# Patient Record
Sex: Female | Born: 1935
Health system: Southern US, Community
[De-identification: ages and names within clinical notes are randomized; demographics above are authoritative.]

## PROBLEM LIST (undated history)

## (undated) DIAGNOSIS — I82513 Chronic embolism and thrombosis of femoral vein, bilateral: Secondary | ICD-10-CM

## (undated) DIAGNOSIS — I998 Other disorder of circulatory system: Secondary | ICD-10-CM

## (undated) DIAGNOSIS — R42 Dizziness and giddiness: Secondary | ICD-10-CM

## (undated) DIAGNOSIS — H409 Unspecified glaucoma: Secondary | ICD-10-CM

## (undated) DIAGNOSIS — M810 Age-related osteoporosis without current pathological fracture: Secondary | ICD-10-CM

## (undated) DIAGNOSIS — E669 Obesity, unspecified: Secondary | ICD-10-CM

## (undated) DIAGNOSIS — I739 Peripheral vascular disease, unspecified: Secondary | ICD-10-CM

## (undated) DIAGNOSIS — K922 Gastrointestinal hemorrhage, unspecified: Secondary | ICD-10-CM

## (undated) DIAGNOSIS — I10 Essential (primary) hypertension: Secondary | ICD-10-CM

## (undated) DIAGNOSIS — M549 Dorsalgia, unspecified: Secondary | ICD-10-CM

## (undated) DIAGNOSIS — C419 Malignant neoplasm of bone and articular cartilage, unspecified: Secondary | ICD-10-CM

## (undated) DIAGNOSIS — F32A Depression, unspecified: Secondary | ICD-10-CM

## (undated) DIAGNOSIS — K449 Diaphragmatic hernia without obstruction or gangrene: Secondary | ICD-10-CM

## (undated) DIAGNOSIS — N183 Chronic kidney disease, stage 3 (moderate): Secondary | ICD-10-CM

## (undated) DIAGNOSIS — M1990 Unspecified osteoarthritis, unspecified site: Secondary | ICD-10-CM

## (undated) DIAGNOSIS — F329 Major depressive disorder, single episode, unspecified: Secondary | ICD-10-CM

## (undated) HISTORY — DX: Other disorder of circulatory system: I99.8

## (undated) HISTORY — PX: BACK SURGERY: SHX140

## (undated) HISTORY — DX: Age-related osteoporosis without current pathological fracture: M81.0

## (undated) HISTORY — DX: Chronic embolism and thrombosis of femoral vein, bilateral: I82.513

## (undated) HISTORY — DX: Unspecified glaucoma: H40.9

## (undated) HISTORY — DX: Dizziness and giddiness: R42

## (undated) HISTORY — PX: CATARACT EXTRACTION W/ INTRAOCULAR LENS  IMPLANT, BILATERAL: SHX1307

## (undated) HISTORY — DX: Diaphragmatic hernia without obstruction or gangrene: K44.9

## (undated) HISTORY — PX: APPENDECTOMY: SHX54

## (undated) HISTORY — DX: Essential (primary) hypertension: I10

## (undated) HISTORY — DX: Obesity, unspecified: E66.9

## (undated) HISTORY — PX: FRACTURE SURGERY: SHX138

---

## 1972-08-15 HISTORY — PX: ABDOMINAL HYSTERECTOMY: SHX81

## 1997-08-15 HISTORY — PX: BREAST BIOPSY: SHX20

## 1999-10-22 ENCOUNTER — Ambulatory Visit (HOSPITAL_COMMUNITY): Admission: RE | Admit: 1999-10-22 | Discharge: 1999-10-22 | Payer: Self-pay | Admitting: Family Medicine

## 2001-01-01 ENCOUNTER — Other Ambulatory Visit: Admission: RE | Admit: 2001-01-01 | Discharge: 2001-01-01 | Payer: Self-pay | Admitting: Family Medicine

## 2004-04-15 DIAGNOSIS — K922 Gastrointestinal hemorrhage, unspecified: Secondary | ICD-10-CM

## 2004-04-15 HISTORY — PX: ESOPHAGOGASTRODUODENOSCOPY: SHX1529

## 2004-04-15 HISTORY — DX: Gastrointestinal hemorrhage, unspecified: K92.2

## 2004-05-14 ENCOUNTER — Inpatient Hospital Stay (HOSPITAL_COMMUNITY): Admission: AD | Admit: 2004-05-14 | Discharge: 2004-05-16 | Payer: Self-pay | Admitting: Internal Medicine

## 2005-04-06 ENCOUNTER — Encounter: Admission: RE | Admit: 2005-04-06 | Discharge: 2005-04-06 | Payer: Self-pay | Admitting: Family Medicine

## 2007-04-26 ENCOUNTER — Emergency Department (HOSPITAL_COMMUNITY): Admission: EM | Admit: 2007-04-26 | Discharge: 2007-04-26 | Payer: Self-pay | Admitting: Emergency Medicine

## 2007-10-14 HISTORY — PX: LAPAROSCOPIC CHOLECYSTECTOMY: SUR755

## 2007-10-22 ENCOUNTER — Encounter (HOSPITAL_BASED_OUTPATIENT_CLINIC_OR_DEPARTMENT_OTHER): Payer: Self-pay | Admitting: General Surgery

## 2007-10-22 ENCOUNTER — Ambulatory Visit (HOSPITAL_COMMUNITY): Admission: RE | Admit: 2007-10-22 | Discharge: 2007-10-23 | Payer: Self-pay | Admitting: General Surgery

## 2009-01-05 ENCOUNTER — Ambulatory Visit: Payer: Self-pay | Admitting: Internal Medicine

## 2009-01-13 ENCOUNTER — Encounter: Payer: Self-pay | Admitting: Internal Medicine

## 2009-01-13 ENCOUNTER — Ambulatory Visit: Payer: Self-pay | Admitting: Internal Medicine

## 2009-01-14 ENCOUNTER — Encounter: Payer: Self-pay | Admitting: Internal Medicine

## 2009-03-05 ENCOUNTER — Encounter: Admission: RE | Admit: 2009-03-05 | Discharge: 2009-03-05 | Payer: Self-pay | Admitting: Family Medicine

## 2010-12-28 NOTE — Op Note (Signed)
[name], [name]              ACCOUNT NO.:  [id number]   MEDICAL RECORD NO.:  [id number]          PATIENT TYPE:  OIB   LOCATION:  [place]                         FACILITY:  [facility]   PHYSICIAN:  Kathrin Penner, M.D.   DATE OF BIRTH:  May 17, 1931   DATE OF PROCEDURE:  10/22/2007  DATE OF DISCHARGE:  10/23/2007                               OPERATIVE REPORT   PREOPERATIVE DIAGNOSIS:  Chronic calculous cholecystitis.   POSTOPERATIVE DIAGNOSIS:  Chronic calculous cholecystitis.   PROCEDURE:  Laparoscopic cholecystectomy with intraoperative  cholangiogram.   SURGEON:  Kathrin Penner, M.D.   ASSISTANT:  Mare Loan, MD   ANESTHESIA:  General.   SPECIMENS TO LAB:  Gallbladder with stones.   ESTIMATED BLOOD LOSS:  Minimal.   COMPLICATIONS:  None.  Patient returned to the PACU in excellent  condition.   Ms. Kristin [name] is a 77 year old lady presenting with a rather  atypical gallbladder pain, complaining primarily of right lower quadrant  and left upper quadrant abdominal pain.  Ultrasound shows a gallbladder  with a solitary gallstone.  Other aspects of the abdominal ultrasound  appeared to be normal.  The patient is having symptoms of nausea with  vomiting, but does not have any increased temperature or chills.  She  comes to the operating room now for laparoscopic cholecystectomy after  risks and potential benefits of surgery had been fully discussed.  All  questions answered, consent for surgery obtained.   PROCEDURE:  The patient is positioned supinely.  Following induction of  satisfactory general anesthesia, the abdomen is prepped and draped to be  included in a sterile operative field.  Positive identification of the  patient Kristin [name] and procedure to be done laparoscopic  cholecystectomy carried out.  Open laparoscopy is created just above the  umbilicus in an old midline scar from a hysterectomy done back in 1977.  The was carefully entered and a Hasson  cannula placed and insufflation  of the peritoneal cavity to 14 mmHg pressure using carbon dioxide  carried out.  Upon placing the camera into the abdomen there were  multiple adhesions up to the anterior abdominal wall.  We were able to  get through in an area of thin adhesions up into the upper abdomen.  Liver edges were sharp.  Liver surfaces smooth.  The duodenum was  somewhat tethered up to the gallbladder.  There were multiple adhesions  over the dome of the liver.  None of the small or large intestine  visualized appeared otherwise to be abnormal.  Under direct vision  epigastric and lateral ports were placed, gallbladder is grasped,  retracted cephalad, was rather large and boggy gallbladder.  There were  multiple adhesions to the wall of the gallbladder which were taken down  serially carrying the dissection down to the ampulla of gallbladder.  The ampulla was grasped and dissection carried down to the region of the  ampulla isolating cystic artery and cystic duct.  The cystic duct was  isolated and traced up to the cystic duct and gallbladder junction and  down to the cystic duct common duct junction for  identification cystic  duct was clipped proximally and opened.  A cystic duct cholangiogram was  carried out the cyst was milked of its contents.  I passed a Cook  catheter into the abdomen into the cystic duct through which I injected  one half-strength Hypaque into the extrahepatic biliary system under  fluoroscopic guidance.  The resulting cholangiogram showed rather  dilated common bile duct but there were no filling defects within it and  the common duct emptied normally into the duodenum.   The cystic duct catheter was then removed and cystic duct triply clipped  and transected.  Cystic artery isolated and traced into the gallbladder  wall, doubly clipped and transected.  The gallbladder was then dissected  free from the liver bed using electrocautery and maintaining  hemostasis  throughout the entire course of dissection.  The end of dissection, the  liver bed was then checked for hemostasis.  Additional bleeding points  treated with electrocautery.  The gallbladder was then retrieved through  the umbilical port placed having been placed in an Endo pouch.  I then  turned my attention down to the lower abdomen where multiple adhesions  to the anterior abdominal wall were taken down serially and carefully.  The pneumoperitoneum was released after the trocars removed under direct  vision.  Sponge, instrument counts were doubly verified and wounds  closed in layers as follows.  Umbilical wound in two layers with 0  Vicryl and 4-0 Monocryl, epigastric and flank wounds closed with 4-0  Monocryl.  All wounds reinforced with Steri-Strips.  Sterile dressings  applied. Anesthetic reversed.  The patient removed from the operating  room to the recovery room in stable condition.  She tolerated the  procedure well.      Kathrin Penner, M.D.  Electronically Signed     PB/MEDQ  D:  10/22/2007  T:  10/23/2007  Job:  [id number]   cc:   De Nurse

## 2010-12-31 NOTE — Op Note (Signed)
Kristin [name], Kristin [name]              ACCOUNT NO.:  [id number]   MEDICAL RECORD NO.:  [id number]          PATIENT TYPE:  INP   LOCATION:  5739                         FACILITY:  [facility]   PHYSICIAN:  James L. Rolla Flatten., M.D.DATE OF BIRTH:  1931-04-16   DATE OF PROCEDURE:  05/14/2004  DATE OF DISCHARGE:                                 OPERATIVE REPORT   PROCEDURE:  Esophagogastroduodenoscopy.   ENDOSCOPIST:  Joyice Faster. Oletta Lamas, M.D.   ANESTHESIA:  Fentanyl 30 mcg, Versed 4 mg IV.   INDICATIONS FOR PROCEDURE:  She had a good bit of vomiting for a  sigmoidoscopy prep, vomited up blood, admitted to the hospital, hemoglobin  has been stable. This is done to look for source of bleeding.   DESCRIPTION OF PROCEDURE:  The procedure had been explained to the patient  and consent obtained. With the patient in the left lateral decubitus  position, the Olympus scope was inserted and advanced. Distal esophagus was  reached. There was what appeared to be a Mallory-Weiss tear and ulcer  extending from the esophagus, across the GE-junction into a hiatal hernia  pouch. Down into the pouch, there was a 3 cm hiatal hernia.  Complete  endoscopy was performed, gastric outlet identified and passed. Duodenum,  including the bulb and second portion were completely normal, as was the  antrum and body of the stomach.  Fundus and cardia revealed the hiatal  hernia as mentioned.  The ulcer extended halfway down into the hiatal hernia  pouch and I really think this was a long, Mallory-Weiss tear.  There was no  active bleeding, and no coffee-grounds material or signs of recent bleeding.  Proximal esophagus was endoscopically normal.  Scope was withdrawn. The  patient tolerated the procedure well.   ASSESSMENT:  1.  Probable old Mallory-Weiss tear versus ulceration without active      bleeding. Will follow clinically.  2.  Will go ahead and start her on full liquids, continue PPIs.       JLE/MEDQ  D:   05/14/2004  T:  05/15/2004  Job:  [id number]   cc:   Edwyna Shell. Jacelyn Grip, M.D.  [address]  [place]  [place] [address]  Fax: [phone]

## 2010-12-31 NOTE — Consult Note (Signed)
Kristin [name], Kristin [name]              ACCOUNT NO.:  [id number]   MEDICAL RECORD NO.:  [id number]          PATIENT TYPE:  INP   LOCATION:  3736                         FACILITY:  [facility]   PHYSICIAN:  James L. Rolla Flatten., M.D.DATE OF BIRTH:  [birth year]   DATE OF CONSULTATION:  05/14/2004  DATE OF DISCHARGE:                                   CONSULTATION   PRIMARY CARE PHYSICIAN:  Francis P. Jacelyn Grip, M.D.   REASON FOR REFERRAL:  Hematemesis.   HISTORY:  Very nice 77 year old patient of Dr. Jodi Mourning who was prepping for  sigmoidoscopy last night.  Apparently, had nausea and vomiting and given a  Colyte.  Had hematemesis, vomited up what was felt to be a large quantity of  bright red blood and then it stopped.  She thought there might have been a  little of blood in the stool.  She had a normal blood pressure in his  office.  She had a sigmoidoscopy this morning to 25 cm.  It was stopped.  There was coffee ground material at that point, but no bright blood.  Initial hemoglobin was 13.  It was felt that the patient may well have a  Mallory-Weiss tear.  She was sent over for further evaluation.   MEDICATIONS ON ADMISSION:  1.  Maxzide.  2.  Fosamax.  3.  Aspirin 81 mg.  4.  Advil several times a week.   Allergic to PENICILLIN, SULFA AND STRAWBERRIES.  All cause rash.   MEDICAL HISTORY:  History of hypertension, high cholesterol, osteoarthritis,  glaucoma, history of kidney stones.   PREVIOUS SURGERIES:  Hysterectomy, appendectomy, benign breast biopsy.   FAMILY HISTORY:  Mother has hypertension as does sister.  Father died of  natural causes.  No family history of cancer.   SOCIAL HISTORY:  She is widowed.  Drinks occasional alcohol socially.  Does  not smoke.  Grew up in [place] and has lived in the [place]. for 12 years.  She  has adult children here in [place].   REVIEW OF SYSTEMS:  Remarkable for lack of heartburn, dyspepsia.  Lack of  previous history of ulcers.  She has no melena  or nausea chronically.  Does  not require anything for indigestion or heartburn.  Takes not antacids;  Tagamet, etc.  Her bowels are usually soft, brown.  No problems with that.   PHYSICAL EXAMINATION:  VITAL SIGNS:  Pulse approximately 80, blood pressure  108/70.  GENERAL:  African-American female in no acute distress.  HEENT:  Eyes:  Sclerae nonicteric.  NECK:  Supple.  No lymphadenopathy.  LUNGS:  Clear.  HEART:  Regular rate and rhythm without murmurs or gallops.  ABDOMEN:  Soft, nontender, nondistended.   ASSESSMENT:  Hematemesis, probably due to Mallory-Weiss tear related to the  prep.  Hemoglobin seems stable, and the patient's vital signs are stable.  I  suspect that she has stopped bleeding at this point.  I do feel upper  endoscopic evaluation will be appropriate.   PLAN:  Go ahead with an endoscopy later this afternoon.  I have discussed  this with the patient.  She is agreeable.  The procedure of endoscopy  including the alternatives have been discussed with her and with her  children present.       JLE/MEDQ  D:  05/14/2004  T:  05/15/2004  Job:  [id number]   cc:   Edwyna Shell. Jacelyn Grip, M.D.  [address]  [place]  [place] [address]  Fax: [phone]

## 2010-12-31 NOTE — Discharge Summary (Signed)
NAMEAMBERLEE, GARVEY              ACCOUNT NO.:  [id number]   MEDICAL RECORD NO.:  [id number]          PATIENT TYPE:  INP   LOCATION:  6333                         FACILITY:  [facility]   PHYSICIAN:  Jerelene Redden, MD      DATE OF BIRTH:  October 22, 1930   DATE OF ADMISSION:  05/14/2004  DATE OF DISCHARGE:  05/16/2004                                 DISCHARGE SUMMARY   HOSPITAL COURSE:  Cedra Villalon is a pleasant 77 year old woman with a  history of hypertension and osteoporosis who was admitted on September 30th  with a 24-hour history of recurrent hematemesis.  A flexible sigmoidoscopy  had been done prior to her admission which showed no obvious colon lesions.  The patient indicated that she took aspirin and occasional Advil for aches  and pains.  On physical exam, her blood pressure was 108/60.  There was no  postural change.  She was afebrile.  HEENT exam was within normal limits.  The chest was clear.  Cardiovascular exam revealed normal S1 & S2 without  rubs, murmurs, or gallops.  The abdomen was benign with normal bowel sounds  without masses or tenderness.  There was no guarding or rebound.  Rectal  exam revealed heme-positive stool.  Neurologic testing and examination of  extremities was normal.  Initial CBC revealed a white count of 5700,  hemoglobin 12.4, CMP revealed a potassium of 3.2.  A chest x-ray was  obtained on admission which revealed a right upper lobe apical 3.7 cm  rounded opacity.  In light of this finding, a CT scan of the chest was done  which showed that the density in the right lung apex is due only to  superimposed tortuous vasculature.  No mass was found on the CT scan.  In  light of the history of hematemesis, the patient was made NPO and was given  Protonix 40 mg IV daily.  Dr. Oletta Lamas of the gastroenterology service saw  the patient and performed upper endoscopy on September 30th.  This showed a  long Mallory-Weiss tear with no signs of active bleeding.  The  patient  remained in the hospital on October 1st for observation.  She had no  evidence of recurrent bleeding, no signs of nausea and vomiting, and she  tolerated her diet well.  Therefore, on October 2nd the patient was  discharged.   DISCHARGE DIAGNOSES:  1.  Upper gastrointestinal bleed secondary to Mallory-Weiss tear.  2.  Abnormal chest x-ray.  CT scan confirms no significant pathology.  3.  Hypertension.  4.  Hyperlipidemia.  5.  Osteoporosis.   DISCHARGE MEDICATIONS:  The patient will continue Protonix 40 mg p.o. daily.  She was also advised to resume her other medicines except that she was  advised to withhold aspirin and Fosamax until she saw Dr. Jacelyn Grip back in the  office.  She was instructed to make a return appointment with Dr. Jacelyn Grip in 7  to 10 days.   CONDITION AT TIME OF DISCHARGE:  Good.       SY/MEDQ  D:  05/16/2004  T:  05/16/2004  Job:  [id number]   cc:   Dub Mikes P. Jacelyn Grip, M.D.  [address]  [place]  [place] [address]  Fax: [place] Rolla Flatten., M.D.  [address], [place]  [place]  [place] [address]  Fax: [phone]

## 2010-12-31 NOTE — H&P (Signed)
Kristin [name], Kristin [name]              ACCOUNT NO.:  [id number]   MEDICAL RECORD NO.:  [id number]          PATIENT TYPE:  INP   LOCATION:  5739                         FACILITY:  [facility]   PHYSICIAN:  Ashby Dawes. Polite, M.D. DATE OF BIRTH:  02/20/1931   DATE OF ADMISSION:  05/14/2004  DATE OF DISCHARGE:                                HISTORY & PHYSICAL   CHIEF COMPLAINT:  Upper GI bleed.   HISTORY OF PRESENT ILLNESS:  The patient is a pleasant 77 year old female  with a history of hypertension, high cholesterol, osteopenia, who was a  direct admission to the medicine floor for evaluation of upper GI bleed.  The patient was in her usual state of health until yesterday at home, she  had approximately three to four bouts of red bloody emesis.  The patient  states that she was in the process of taking a prep for routine flexible  sigmoidoscopy.  The patient admit to feeling lightheaded, woozy, and  decreased energy.  The patient presented to her primary M.D. this morning  when she was reported as hemodynamically stable.  The patient underwent a  partial flexible sigmoidoscopy up to 25 cm.  It was stopped secondary to the  patient experiencing pain.  Per note of the endoscopist, Dr. __________,  there were black streaks of coffee ground-like particles, and the stool was  heme positive.  No active blood was seen and no tumor.  The patient was sent  to the hospital for further evaluation and treatment.  Of note, the patient  does take aspirin and an occasional Advil two to three times a week.  She  also is on Fosamax for osteopenia.  Denies any alcohol or history of ulcer  disease.   PAST MEDICAL HISTORY:  As stated above.   MEDICATIONS:  1.  Maxzide.  2.  Fosamax.  3.  Aspirin.  4.  Advil one or two times a week for greater than a year.   SOCIAL HISTORY:  Negative for tobacco, alcohol, or drugs.   PAST SURGICAL HISTORY:  Significant for partial hysterectomy greater than 20  years ago,  left benign breast biopsy in 1999.  The patient had appendectomy  as a child.   ALLERGIES:  PENICILLIN, SULFA, STRAWBERRIES (all of which cause a rash).   FAMILY HISTORY:  Mother with hypertension.  Father is deceased.  Otherwise  noncontributory.   REVIEW OF SYSTEMS:  As stated in the HPI.   PHYSICAL EXAMINATION:  VITAL SIGNS:  In the primary M.D. office significant  for blood pressure 108/60 lying, 124/80 standing.  The patient is afebrile,  however, does complain of feeling lightheaded and woozy.  HEENT:  Anicteric sclerae significant for arcus senilis in the eyes.  No  oral lesions. No nodes, no JVD.  CHEST:  Clear to auscultation bilaterally.  HEART:  Regular, no S3.  ABDOMEN:  Soft, nontender, and no mass.  EXTREMITIES:  No edema.  RECTAL:  Deferred.  NEUROLOGY:  Nonfocal.   LABORATORY DATA:  Pending at the time of this dictation, however, outpatient  labs show hemoglobin of 13.   ASSESSMENT:  1.  Upper gastrointestinal bleed.  Differential diagnoses include      esophagitis versus ulcer.  2.  Hypertension.  3.  Hypercholesterolemia.  4.  Reported history of prolonged QTC.  5.  Osteopenia.  6.  Degenerative joint disease of the knees requiring chronic NSAIDS.  7.  History of left renal calculus in December of 2003.  8.  Allergic rhinitis.   I recommend the patient to be admitted to a telemetry floor bed, obtain stat  H&H with serial H&H.  Will start IV Protonix, NPO, and GI evaluation for  endoscopy.  Will make further recommendations after review of the above  studies.      Ronalee Red   RDP/MEDQ  D:  05/14/2004  T:  05/14/2004  Job:  [id number]

## 2011-01-18 ENCOUNTER — Emergency Department (HOSPITAL_COMMUNITY)
Admission: EM | Admit: 2011-01-18 | Discharge: 2011-01-18 | Disposition: A | Discharge status: Home / Self Care | Payer: Medicare Other | Attending: Emergency Medicine | Admitting: Emergency Medicine

## 2011-01-18 DIAGNOSIS — R51 Headache: Secondary | ICD-10-CM | POA: Insufficient documentation

## 2011-01-18 DIAGNOSIS — R0789 Other chest pain: Secondary | ICD-10-CM | POA: Insufficient documentation

## 2011-01-18 DIAGNOSIS — Z79899 Other long term (current) drug therapy: Secondary | ICD-10-CM | POA: Insufficient documentation

## 2011-01-18 DIAGNOSIS — I10 Essential (primary) hypertension: Secondary | ICD-10-CM | POA: Insufficient documentation

## 2011-01-18 DIAGNOSIS — R5381 Other malaise: Secondary | ICD-10-CM | POA: Insufficient documentation

## 2011-01-18 DIAGNOSIS — R42 Dizziness and giddiness: Secondary | ICD-10-CM | POA: Insufficient documentation

## 2011-01-18 LAB — POCT I-STAT, CHEM 8
BUN: 17 mg/dL (ref 6–23)
Calcium, Ion: 1.17 mmol/L (ref 1.12–1.32)
Chloride: 103 mEq/L (ref 96–112)
Creatinine, Ser: 1.3 mg/dL — ABNORMAL HIGH (ref 0.4–1.2)
Glucose, Bld: 121 mg/dL — ABNORMAL HIGH (ref 70–99)
HCT: 37 % (ref 36.0–46.0)
Hemoglobin: 12.6 g/dL (ref 12.0–15.0)
Potassium: 3.5 mEq/L (ref 3.5–5.1)
Sodium: 139 mEq/L (ref 135–145)
TCO2: 25 mmol/L (ref 0–100)

## 2011-02-09 ENCOUNTER — Other Ambulatory Visit: Payer: Self-pay | Admitting: Family Medicine

## 2011-02-09 DIAGNOSIS — R42 Dizziness and giddiness: Secondary | ICD-10-CM

## 2011-02-10 ENCOUNTER — Ambulatory Visit
Admission: RE | Admit: 2011-02-10 | Discharge: 2011-02-10 | Disposition: A | Discharge status: Home / Self Care | Payer: Medicare Other | Source: Ambulatory Visit | Attending: Family Medicine | Admitting: Family Medicine

## 2011-02-10 DIAGNOSIS — R42 Dizziness and giddiness: Secondary | ICD-10-CM

## 2011-04-06 ENCOUNTER — Other Ambulatory Visit: Payer: Self-pay | Admitting: Family Medicine

## 2011-04-06 DIAGNOSIS — M549 Dorsalgia, unspecified: Secondary | ICD-10-CM

## 2011-04-14 ENCOUNTER — Ambulatory Visit
Admission: RE | Admit: 2011-04-14 | Discharge: 2011-04-14 | Disposition: A | Discharge status: Home / Self Care | Payer: Medicare Other | Source: Ambulatory Visit | Attending: Family Medicine | Admitting: Family Medicine

## 2011-04-14 DIAGNOSIS — M549 Dorsalgia, unspecified: Secondary | ICD-10-CM

## 2011-05-09 LAB — DIFFERENTIAL
Basophils Absolute: 0
Basophils Relative: 1
Eosinophils Absolute: 0.1
Eosinophils Relative: 2
Lymphocytes Relative: 46
Lymphs Abs: 1.8
Monocytes Absolute: 0.4
Monocytes Relative: 10
Neutro Abs: 1.7
Neutrophils Relative %: 42 — ABNORMAL LOW

## 2011-05-09 LAB — COMPREHENSIVE METABOLIC PANEL
ALT: 18
AST: 31
Albumin: 4.1
Alkaline Phosphatase: 47
BUN: 22
CO2: 30
Calcium: 10
Chloride: 103
Creatinine, Ser: 1.24 — ABNORMAL HIGH
GFR calc Af Amer: 51 — ABNORMAL LOW
GFR calc non Af Amer: 42 — ABNORMAL LOW
Glucose, Bld: 89
Potassium: 3.8
Sodium: 142
Total Bilirubin: 0.8
Total Protein: 6.5

## 2011-05-09 LAB — URINALYSIS, ROUTINE W REFLEX MICROSCOPIC
Bilirubin Urine: NEGATIVE
Glucose, UA: NEGATIVE
Hgb urine dipstick: NEGATIVE
Ketones, ur: NEGATIVE
Nitrite: NEGATIVE
Protein, ur: NEGATIVE
Specific Gravity, Urine: 1.012
Urobilinogen, UA: 0.2
pH: 7

## 2011-05-09 LAB — CBC
HCT: 35.8 — ABNORMAL LOW
Hemoglobin: 12.2
MCHC: 34.1
MCV: 98.1
Platelets: 244
RBC: 3.65 — ABNORMAL LOW
RDW: 13.1
WBC: 3.9 — ABNORMAL LOW

## 2011-05-16 HISTORY — PX: FIXATION KYPHOPLASTY THORACIC SPINE: SHX1643

## 2011-05-20 ENCOUNTER — Other Ambulatory Visit (HOSPITAL_COMMUNITY): Payer: Self-pay | Admitting: Orthopedic Surgery

## 2011-05-20 ENCOUNTER — Encounter (HOSPITAL_COMMUNITY)
Admission: RE | Admit: 2011-05-20 | Discharge: 2011-05-20 | Disposition: A | Discharge status: Home / Self Care | Payer: Medicare Other | Source: Ambulatory Visit | Attending: Orthopedic Surgery | Admitting: Orthopedic Surgery

## 2011-05-20 DIAGNOSIS — S22009A Unspecified fracture of unspecified thoracic vertebra, initial encounter for closed fracture: Secondary | ICD-10-CM

## 2011-05-20 LAB — CBC
HCT: 34.9 % — ABNORMAL LOW (ref 36.0–46.0)
Hemoglobin: 12.4 g/dL (ref 12.0–15.0)
MCH: 33.2 pg (ref 26.0–34.0)
MCHC: 35.5 g/dL (ref 30.0–36.0)
MCV: 93.3 fL (ref 78.0–100.0)
Platelets: 244 10*3/uL (ref 150–400)
RBC: 3.74 MIL/uL — ABNORMAL LOW (ref 3.87–5.11)
RDW: 12.7 % (ref 11.5–15.5)
WBC: 5.3 10*3/uL (ref 4.0–10.5)

## 2011-05-20 LAB — BASIC METABOLIC PANEL
BUN: 19 mg/dL (ref 6–23)
CO2: 29 mEq/L (ref 19–32)
Calcium: 10.5 mg/dL (ref 8.4–10.5)
Chloride: 100 mEq/L (ref 96–112)
Creatinine, Ser: 1.14 mg/dL — ABNORMAL HIGH (ref 0.50–1.10)
GFR calc Af Amer: 52 mL/min — ABNORMAL LOW (ref >90)
GFR calc non Af Amer: 45 mL/min — ABNORMAL LOW (ref >90)
Glucose, Bld: 94 mg/dL (ref 70–99)
Potassium: 4.1 mEq/L (ref 3.5–5.1)
Sodium: 141 mEq/L (ref 135–145)

## 2011-05-25 ENCOUNTER — Ambulatory Visit (HOSPITAL_COMMUNITY)
Admission: RE | Admit: 2011-05-25 | Discharge: 2011-05-26 | Disposition: A | Discharge status: Home / Self Care | Payer: Medicare Other | Source: Ambulatory Visit | Attending: Orthopedic Surgery | Admitting: Orthopedic Surgery

## 2011-05-25 ENCOUNTER — Ambulatory Visit (HOSPITAL_COMMUNITY): Payer: Medicare Other

## 2011-05-25 DIAGNOSIS — Z01811 Encounter for preprocedural respiratory examination: Secondary | ICD-10-CM | POA: Insufficient documentation

## 2011-05-25 DIAGNOSIS — I10 Essential (primary) hypertension: Secondary | ICD-10-CM | POA: Insufficient documentation

## 2011-05-25 DIAGNOSIS — M8448XA Pathological fracture, other site, initial encounter for fracture: Secondary | ICD-10-CM | POA: Insufficient documentation

## 2011-05-25 DIAGNOSIS — Z01812 Encounter for preprocedural laboratory examination: Secondary | ICD-10-CM | POA: Insufficient documentation

## 2011-05-25 DIAGNOSIS — E669 Obesity, unspecified: Secondary | ICD-10-CM | POA: Insufficient documentation

## 2011-05-25 DIAGNOSIS — Z01818 Encounter for other preprocedural examination: Secondary | ICD-10-CM | POA: Insufficient documentation

## 2011-05-31 NOTE — Op Note (Signed)
NAMEELSY, Kristin [name]              ACCOUNT NO.:  [id number]  MEDICAL RECORD NO.:  [id number]  LOCATION:  [facility]SC                         FACILITY:  [facility]  PHYSICIAN:  Dahlia Bailiff, MD    DATE OF BIRTH:  05/26/1931  DATE OF PROCEDURE:  05/25/2011 DATE OF DISCHARGE:  02/14/2011                              OPERATIVE REPORT   PREOPERATIVE DIAGNOSIS:  T10 compression fracture.  POSTOPERATIVE DIAGNOSIS:  T10 compression fracture.  OPERATIVE PROCEDURE:  T10 kyphoplasty.  COMPLICATIONS:  None.  CONDITION:  Stable.  FIRST ASSISTANT:  Carron Curie, PA  HISTORY:  Kristin [name] is a very pleasant 77 year old who has had longstanding low back pain with acute increasing pain around the level of her bra strap.  The patient was diagnosed with a L5-S1 slip due to multiple degenerative thoracolumbar scolioses as well as a T10 compression fracture.  The patient had localized significant pain at the T10 level, which she said was causing the greatest amount of pain.  At this point after attempts at conservative management failed to alleviate or even reduce her pain, we elected to proceed with surgical solution. After discussing treatment options, risks, benefits, and alternatives, we elected to proceed with the kyphoplasty.  All appropriate risks and benefits were addressed and the patient's questions were answered.  OPERATIVE NOTE:  The patient was brought to the operating room and placed supine on the operating table.  After successful induction of general anesthesia and endotracheal intubation, TEDS and SCDs were applied.  She was turned prone onto a chest and pelvic roll and the arms were tucked at the side.  The back was prepped and draped in a standard fashion.  She was placed into reverse Trendelenburg position and rotated to adjust for the scoliosis.  A time-out was then done to confirm patient and procedure and all other pertinent important data.  Once this was completed, two C-arms  were brought into the surgical site sterilely, one on the AP and the other on the lateral plane.  I identified the T10 vertebral body.  On the AP x-ray, I counted from the 12th rib vertebra up to T10 and in the lateral I counted up from L5 up to T10.  Once I confirmed that I was at the appropriate level in both planes, I proceeded with surgery.  A stab incision was made on the lateral corner of each pedicle and I advanced the trocars percutaneously down to the lateral aspect the pedicle.  I confirmed position and trajectory with x- ray and then malleted the trocar.  I used an extra-pedicular approach going through the side of the pedicle and into the vertebral body.  Care was taken not to violate the medial border of the pedicle until I was in the vertebral body itself.  This confirmed that I had not breached the medial portion of the pedicle and caused a nerve injury.  Once I had both trocars on either side properly positioned and they were properly positioned, I then used a drill and then placed the inflatable bone tamps.  I inflated until I had an adequate balloon inflation and I removed it.  The cement had been mixed on the back table  and allowed to set up until the consistency of about toothpaste.  With such a thick viscous consistency, it was easy to insert.  I had good cement fill and went across the midline.  I used real-time fluoro in both AP and lateral planes while I was injecting cement to ensure that it did not leak anteriorly, posteriorly, or laterally.  Once the cement was allowed to harden, I took final x-rays which were satisfactory.  The trocars were removed.  The skin edges were clean and then the wounds were closed with 3-0 Monocryl and a dry dressing was applied.  The patient was ultimately extubated and transferred to PACU without incident.  At the end of the case, needle, instrument, and sponge counts were correct.  First assistant is Carron Curie, my PAC.  She was  assisting with inflation of the bone and assisting and putting in the cement as well as the wound closure.     Dahlia Bailiff, MD     DDB/MEDQ  D:  05/25/2011  T:  05/25/2011  Job:  [id number]  Electronically Signed by Melina Schools MD on 05/31/2011 10:52:48 AM

## 2012-12-08 ENCOUNTER — Other Ambulatory Visit: Payer: Self-pay | Admitting: Family Medicine

## 2012-12-10 ENCOUNTER — Other Ambulatory Visit: Payer: Self-pay | Admitting: Family Medicine

## 2012-12-10 NOTE — Telephone Encounter (Signed)
Okay to call in.

## 2012-12-10 NOTE — Telephone Encounter (Signed)
Norco/vicodin [phone]  #[phone] with on refills called to [facility],[phone].  Pt aware.

## 2012-12-10 NOTE — Telephone Encounter (Signed)
LAST RF 10/12/12. LAST OV 09/19/12. PLEASE PRINT AND LET PT KNOW

## 2013-01-01 ENCOUNTER — Encounter: Payer: Self-pay | Admitting: Family Medicine

## 2013-01-01 ENCOUNTER — Ambulatory Visit (INDEPENDENT_AMBULATORY_CARE_PROVIDER_SITE_OTHER): Payer: Medicare Other | Admitting: Family Medicine

## 2013-01-01 VITALS — BP 162/80 | HR 77 | Temp 97.2°F | Ht 59.0 in | Wt 159.0 lb

## 2013-01-01 DIAGNOSIS — M810 Age-related osteoporosis without current pathological fracture: Secondary | ICD-10-CM

## 2013-01-01 DIAGNOSIS — J302 Other seasonal allergic rhinitis: Secondary | ICD-10-CM | POA: Insufficient documentation

## 2013-01-01 DIAGNOSIS — M549 Dorsalgia, unspecified: Secondary | ICD-10-CM

## 2013-01-01 DIAGNOSIS — J309 Allergic rhinitis, unspecified: Secondary | ICD-10-CM

## 2013-01-01 DIAGNOSIS — N179 Acute kidney failure, unspecified: Secondary | ICD-10-CM | POA: Insufficient documentation

## 2013-01-01 DIAGNOSIS — E7800 Pure hypercholesterolemia, unspecified: Secondary | ICD-10-CM | POA: Insufficient documentation

## 2013-01-01 DIAGNOSIS — N183 Chronic kidney disease, stage 3 unspecified: Secondary | ICD-10-CM | POA: Insufficient documentation

## 2013-01-01 DIAGNOSIS — IMO0001 Reserved for inherently not codable concepts without codable children: Secondary | ICD-10-CM

## 2013-01-01 DIAGNOSIS — G8929 Other chronic pain: Secondary | ICD-10-CM

## 2013-01-01 DIAGNOSIS — M4850XA Collapsed vertebra, not elsewhere classified, site unspecified, initial encounter for fracture: Secondary | ICD-10-CM | POA: Insufficient documentation

## 2013-01-01 DIAGNOSIS — M8000XA Age-related osteoporosis with current pathological fracture, unspecified site, initial encounter for fracture: Secondary | ICD-10-CM | POA: Insufficient documentation

## 2013-01-01 DIAGNOSIS — N189 Chronic kidney disease, unspecified: Secondary | ICD-10-CM

## 2013-01-01 DIAGNOSIS — E785 Hyperlipidemia, unspecified: Secondary | ICD-10-CM

## 2013-01-01 DIAGNOSIS — I10 Essential (primary) hypertension: Secondary | ICD-10-CM | POA: Insufficient documentation

## 2013-01-01 LAB — HEPATIC FUNCTION PANEL
ALT: 13 U/L (ref 0–35)
AST: 24 U/L (ref 0–37)
Albumin: 4.6 g/dL (ref 3.5–5.2)
Alkaline Phosphatase: 71 U/L (ref 39–117)
Bilirubin, Direct: 0.1 mg/dL (ref 0.0–0.3)
Indirect Bilirubin: 0.5 mg/dL (ref 0.0–0.9)
Total Bilirubin: 0.6 mg/dL (ref 0.3–1.2)
Total Protein: 6.4 g/dL (ref 6.0–8.3)

## 2013-01-01 LAB — BASIC METABOLIC PANEL WITH GFR
BUN: 15 mg/dL (ref 6–23)
CO2: 28 mEq/L (ref 19–32)
Calcium: 10.2 mg/dL (ref 8.4–10.5)
Chloride: 104 mEq/L (ref 96–112)
Creat: 0.96 mg/dL (ref 0.50–1.10)
GFR, Est African American: 65 mL/min
GFR, Est Non African American: 56 mL/min — ABNORMAL LOW
Glucose, Bld: 101 mg/dL — ABNORMAL HIGH (ref 70–99)
Potassium: 4.1 mEq/L (ref 3.5–5.3)
Sodium: 142 mEq/L (ref 135–145)

## 2013-01-01 MED ORDER — HYDROCODONE-ACETAMINOPHEN 5-325 MG PO TABS: ORAL_TABLET | ORAL | Status: DC

## 2013-01-01 MED ORDER — FLUTICASONE PROPIONATE 50 MCG/ACT NA SUSP: 2.0000 | Freq: Every day | NASAL | Status: DC

## 2013-01-01 NOTE — Patient Instructions (Signed)
      Dr Paula Libra Recommendations  Diet and Exercise discussed with patient.  For nutrition information, I recommend books:  1).Eat to Live by Dr Excell Seltzer. 2).Prevent and Reverse Heart Disease by Dr Karl Luke. 3) Dr Janene Harvey Book: Reversing Diabetes  Exercise recommendations are:  If unable to walk, then the patient can exercise in a chair 3 times a day. By flapping arms like a bird gently and raising legs outwards to the front.  If ambulatory, the patient can go for walks for 30 minutes 3 times a week. Then increase the intensity and duration as tolerated.  Goal is to try to attain exercise frequency to 5 times a week.  If applicable: Best to perform resistance exercises (machines or weights) 2 days a week and cardio type exercises 3 days per week.   Agree with the [employer] water aerobics.  Need to lose weight to reduce the stress off the back.

## 2013-01-01 NOTE — Progress Notes (Signed)
Patient ID: Kristin [name], female   DOB: [birth year], 79 y.o.   MRN: [id number] SUBJECTIVE: HPI: Patient is here for follow up of hypertension:  denies Headache;deniesChest Pain;denies weakness;denies Shortness of Breath or Orthopnea;denies Visual changes;denies palpitations;denies cough;denies pedal edema;denies symptoms of TIA or stroke; admits to Compliance with medications. denies Problems with medications.  Has problems with the back. Saw Dr Rolena Infante. Had Xrays. Back shifted. He wanted to give her an injection in the back.she wanted to discuss with her PCP   PMH/PSH: reviewed/updated in Epic  SH/FH: reviewed/updated in Epic  Allergies: reviewed/updated in Epic  Medications: reviewed/updated in Epic  Immunizations: reviewed/updated in Epic  ROS: As above in the HPI. All other systems are stable or negative.  OBJECTIVE: APPEARANCE:  Patient in no acute distress.The patient appeared well nourished and normally developed. Acyanotic. Waist: VITAL SIGNS:BP 162/80  Pulse 77  Temp(Src) 97.2 F (36.2 C) (Oral)  Ht 4' 11"  (1.499 m)  Wt 159 lb (72.122 kg)  BMI 32.1 kg/m2  Short stature [place] Female. obese  SKIN: warm and  Dry without overt rashes, tattoos and scars  HEAD and Neck: without JVD, Head and scalp: normal Eyes:No scleral icterus. Fundi normal, eye movements normal. Ears: Auricle normal, canal normal, Tympanic membranes normal, insufflation normal. Nose: normal Throat: normal Neck & thyroid: normal  CHEST & LUNGS: Chest wall: normal Lungs: Clear  CVS: Reveals the PMI to be normally located. Regular rhythm, First and Second Heart sounds are normal,  absence of murmurs, rubs or gallops. Peripheral vasculature: Radial pulses: normal Dorsal pedis pulses: normal Posterior pulses: normal  ABDOMEN:  Appearance:obese Benign,, no organomegaly, no masses, no Abdominal Aortic enlargement. No Guarding , no rebound. No Bruits. Bowel sounds:  normal  RECTAL: N/A GU: N/A  EXTREMETIES: nonedematous. Both Femoral and Pedal pulses are normal.  MUSCULOSKELETAL:  Spine: kyphosis Joints:left knee crepitus and reduced ROM  NEUROLOGIC: oriented to time,place and person; nonfocal. Strength is normal Sensory is normal Reflexes are normal Cranial Nerves are normal.  ASSESSMENT: HTN (hypertension) - Plan: BASIC METABOLIC PANEL WITH GFR  HLD (hyperlipidemia) - Plan: Hepatic function panel, NMR Lipoprofile with Lipids  Osteoporosis, unspecified - Plan: Vitamin D 25 hydroxy, HYDROcodone-acetaminophen (NORCO/VICODIN) 5-325 MG per tablet  CKD (chronic kidney disease)  Chronic back pain - Plan: HYDROcodone-acetaminophen (NORCO/VICODIN) 5-325 MG per tablet  Vertebral compression fracture, sequela - Plan: HYDROcodone-acetaminophen (NORCO/VICODIN) 5-325 MG per tablet  Seasonal allergic rhinitis - Plan: fluticasone (FLONASE) 50 MCG/ACT nasal spray   PLAN: Complete form for personal asisstance.  Orders Placed This Encounter  Procedures  . BASIC METABOLIC PANEL WITH GFR  . Hepatic function panel  . NMR Lipoprofile with Lipids  . Vitamin D 25 hydroxy   Results for orders placed during the hospital encounter of 05/20/11  SURGICAL PCR SCREEN      Result Value Range   MRSA, PCR NEGATIVE  NEGATIVE   Staphylococcus aureus NEGATIVE  NEGATIVE  CBC      Result Value Range   WBC 5.3  4.0 - 10.5 K/uL   RBC 3.74 (*) 3.87 - 5.11 MIL/uL   Hemoglobin 12.4  12.0 - 15.0 g/dL   HCT 34.9 (*) 36.0 - 46.0 %   MCV 93.3  78.0 - 100.0 fL   MCH 33.2  26.0 - 34.0 pg   MCHC 35.5  30.0 - 36.0 g/dL   RDW 12.7  11.5 - 15.5 %   Platelets 244  150 - 400 K/uL  BASIC METABOLIC PANEL  Result Value Range   Sodium 141  135 - 145 mEq/L   Potassium 4.1  3.5 - 5.1 mEq/L   Chloride 100  96 - 112 mEq/L   CO2 29  19 - 32 mEq/L   Glucose, Bld 94  70 - 99 mg/dL   BUN 19  6 - 23 mg/dL   Creatinine, Ser 1.14 (*) 0.50 - 1.10 mg/dL   Calcium 10.5  8.4 -  10.5 mg/dL   GFR calc non Af Amer 45 (*) >90 mL/min   GFR calc Af Amer 52 (*) >90 mL/min   Meds ordered this encounter  Medications  . valsartan-hydrochlorothiazide (DIOVAN-HCT) 80-12.5 MG per tablet    Sig: Take 1 tablet by mouth daily.  Marland Kitchen LORazepam (ATIVAN) 0.5 MG tablet    Sig: Take 0.5 mg by mouth every 8 (eight) hours.  . risperiDONE (RISPERDAL) 0.5 MG tablet    Sig: Take 0.5 mg by mouth daily.  . carvedilol (COREG) 6.25 MG tablet    Sig: Take 6.25 mg by mouth 2 (two) times daily with a meal.  . aspirin 81 MG tablet    Sig: Take 81 mg by mouth daily.  Marland Kitchen amLODipine (NORVASC) 5 MG tablet    Sig: Take 5 mg by mouth daily.  . Multiple Vitamins-Minerals (CENTRUM SILVER PO)    Sig: Take by mouth.  . Teriparatide, Recombinant, 600 MCG/2.4ML SOLN    Sig: Inject 20 mcg into the skin.  . fluticasone (FLONASE) 50 MCG/ACT nasal spray    Sig: Place 2 sprays into the nose daily.    Dispense:  16 g    Refill:  6  . HYDROcodone-acetaminophen (NORCO/VICODIN) 5-325 MG per tablet    Sig: TAKE ONE TABLET BY MOUTH EVERY 6 HOURS AS NEEDED FOR PAIN    Dispense:  60 tablet    Refill:  1   RTc 3 months.  Agree with the need for spinal injections.  Diet and weight loss discussed.  Hulbert Branscome P. Jacelyn Grip, M.D.

## 2013-01-02 LAB — VITAMIN D 25 HYDROXY (VIT D DEFICIENCY, FRACTURES): Vit D, 25-Hydroxy: 36 ng/mL (ref 30–89)

## 2013-01-04 LAB — NMR LIPOPROFILE WITH LIPIDS
Cholesterol, Total: 209 mg/dL — ABNORMAL HIGH (ref <200)
HDL Particle Number: 29.3 umol/L — ABNORMAL LOW (ref >=30.5)
HDL Size: 10.4 nm (ref >=9.2)
HDL-C: 66 mg/dL (ref >=40)
LDL (calc): 128 mg/dL — ABNORMAL HIGH (ref <100)
LDL Particle Number: 1524 nmol/L — ABNORMAL HIGH (ref <1000)
LDL Size: 21.7 nm (ref >20.5)
LP-IR Score: <25 (ref <=45)
Large HDL-P: 14.1 umol/L (ref >=4.8)
Large VLDL-P: <0.8 nmol/L (ref <=2.7)
Small LDL Particle Number: <90 nmol/L (ref <=527)
Triglycerides: 76 mg/dL (ref <150)
VLDL Size: 41.3 nm (ref <=46.6)

## 2013-01-05 ENCOUNTER — Other Ambulatory Visit: Payer: Self-pay | Admitting: Family Medicine

## 2013-01-05 MED ORDER — EZETIMIBE 10 MG PO TABS: 10.0000 mg | ORAL_TABLET | Freq: Every day | ORAL | Status: DC

## 2013-02-11 ENCOUNTER — Other Ambulatory Visit: Payer: Self-pay

## 2013-02-11 MED ORDER — TERIPARATIDE (RECOMBINANT) 600 MCG/2.4ML ~~LOC~~ SOLN: SUBCUTANEOUS | Status: DC

## 2013-02-11 NOTE — Telephone Encounter (Signed)
Last seen 01/01/13  [facility]

## 2013-02-20 ENCOUNTER — Encounter: Payer: Self-pay | Admitting: *Deleted

## 2013-03-20 ENCOUNTER — Other Ambulatory Visit: Payer: Self-pay | Admitting: Family Medicine

## 2013-03-20 DIAGNOSIS — H8110 Benign paroxysmal vertigo, unspecified ear: Secondary | ICD-10-CM

## 2013-03-20 MED ORDER — MECLIZINE HCL 25 MG PO TABS: 25.0000 mg | ORAL_TABLET | Freq: Three times a day (TID) | ORAL | Status: DC | PRN

## 2013-03-25 ENCOUNTER — Other Ambulatory Visit: Payer: Self-pay | Admitting: *Deleted

## 2013-03-25 MED ORDER — TERIPARATIDE (RECOMBINANT) 600 MCG/2.4ML ~~LOC~~ SOLN: SUBCUTANEOUS | Status: DC

## 2013-04-03 ENCOUNTER — Ambulatory Visit: Payer: Medicare Other | Admitting: Family Medicine

## 2013-04-05 ENCOUNTER — Encounter: Payer: Self-pay | Admitting: Family Medicine

## 2013-04-05 ENCOUNTER — Ambulatory Visit (INDEPENDENT_AMBULATORY_CARE_PROVIDER_SITE_OTHER): Payer: Medicare Other | Admitting: Family Medicine

## 2013-04-05 VITALS — BP 147/81 | HR 104 | Temp 97.7°F | Wt 159.2 lb

## 2013-04-05 DIAGNOSIS — G8929 Other chronic pain: Secondary | ICD-10-CM

## 2013-04-05 DIAGNOSIS — IMO0001 Reserved for inherently not codable concepts without codable children: Secondary | ICD-10-CM

## 2013-04-05 DIAGNOSIS — H8110 Benign paroxysmal vertigo, unspecified ear: Secondary | ICD-10-CM

## 2013-04-05 DIAGNOSIS — E785 Hyperlipidemia, unspecified: Secondary | ICD-10-CM

## 2013-04-05 DIAGNOSIS — N189 Chronic kidney disease, unspecified: Secondary | ICD-10-CM

## 2013-04-05 DIAGNOSIS — M810 Age-related osteoporosis without current pathological fracture: Secondary | ICD-10-CM

## 2013-04-05 DIAGNOSIS — IMO0002 Reserved for concepts with insufficient information to code with codable children: Secondary | ICD-10-CM

## 2013-04-05 DIAGNOSIS — J309 Allergic rhinitis, unspecified: Secondary | ICD-10-CM

## 2013-04-05 DIAGNOSIS — M549 Dorsalgia, unspecified: Secondary | ICD-10-CM

## 2013-04-05 DIAGNOSIS — I10 Essential (primary) hypertension: Secondary | ICD-10-CM

## 2013-04-05 DIAGNOSIS — J302 Other seasonal allergic rhinitis: Secondary | ICD-10-CM

## 2013-04-05 MED ORDER — MECLIZINE HCL 25 MG PO TABS: 25.0000 mg | ORAL_TABLET | Freq: Three times a day (TID) | ORAL | Status: DC | PRN

## 2013-04-05 MED ORDER — EZETIMIBE 10 MG PO TABS: 10.0000 mg | ORAL_TABLET | Freq: Every day | ORAL | Status: DC

## 2013-04-05 MED ORDER — VALSARTAN-HYDROCHLOROTHIAZIDE 80-12.5 MG PO TABS
1.0000 | ORAL_TABLET | Freq: Every day | ORAL | Status: DC
Start: 2013-04-05 — End: 2016-06-21

## 2013-04-05 MED ORDER — RISPERIDONE 0.5 MG PO TABS: 0.5000 mg | ORAL_TABLET | Freq: Every day | ORAL | Status: DC

## 2013-04-05 MED ORDER — HYDROCODONE-ACETAMINOPHEN 5-325 MG PO TABS: ORAL_TABLET | ORAL | Status: DC

## 2013-04-05 NOTE — Progress Notes (Signed)
Patient ID: Kristin [name], female   DOB: 1930/09/30, 79 y.o.   MRN: [id number] SUBJECTIVE: CC:   Chief Complaint  Patient presents with  . Follow-up    3 month follow up chronic problems .  requesting refills on alll except amlodipine    HPI: Patient is here for follow up of hyperlipidemia: denies Headache;denies Chest Pain;denies weakness;denies Shortness of Breath and orthopnea;denies Visual changes;denies palpitations;denies cough;denies pedal edema;denies symptoms of TIA or stroke;deniesClaudication symptoms. admits to Compliance with medications; denies Problems with medications.  The vertigo flared up recently. The day was very hot. The A/C in the car wasn't working. Was not feeling well with the heat and that night the room was spinning. The meclizine  Resolved the vertigo.  Didn't sleep well last night. Stressed over a niece in [place] died in her 67s of a ruptured aneurysm intracranially.  Feeling older and travelling less to see family.   Past Medical History  Diagnosis Date  . Hypertension   . Obesity   . Allergy   . Osteoporosis   . Glaucoma   . Hiatal hernia    No past surgical history on file. History   Social History  . Marital Status: Widowed    Spouse Name: N/A    Number of Children: N/A  . Years of Education: N/A   Occupational History  . Not on file.   Social History Main Topics  . Smoking status: Never Smoker   . Smokeless tobacco: Not on file  . Alcohol Use: Not on file  . Drug Use: Not on file  . Sexual Activity: Not on file   Other Topics Concern  . Not on file   Social History Narrative  . No narrative on file   No family history on file. Current Outpatient Prescriptions on File Prior to Visit  Medication Sig Dispense Refill  . amLODipine (NORVASC) 5 MG tablet Take 5 mg by mouth daily.      Marland Kitchen aspirin 81 MG tablet Take 81 mg by mouth daily.      Marland Kitchen ezetimibe (ZETIA) 10 MG tablet Take 1 tablet (10 mg total) by mouth daily.  30 tablet   5  . HYDROcodone-acetaminophen (NORCO/VICODIN) 5-325 MG per tablet TAKE ONE TABLET BY MOUTH EVERY 6 HOURS AS NEEDED FOR PAIN  60 tablet  1  . meclizine (ANTIVERT) 25 MG tablet Take 1 tablet (25 mg total) by mouth 3 (three) times daily as needed.  30 tablet  0  . Multiple Vitamins-Minerals (CENTRUM SILVER PO) Take by mouth.      . risperiDONE (RISPERDAL) 0.5 MG tablet Take 0.5 mg by mouth daily.      . Teriparatide, Recombinant, (FORTEO) 600 MCG/2.4ML SOLN Injected 20 mcg subcutaneously one time per day as directed  2.4 mL  0  . valsartan-hydrochlorothiazide (DIOVAN-HCT) 80-12.5 MG per tablet Take 1 tablet by mouth daily.       No current facility-administered medications on file prior to visit.   Allergies  Allergen Reactions  . Penicillins     REACTION: rash  . Sulfonamide Derivatives     REACTION: itching, rash   Immunization History  Administered Date(s) Administered  . Pneumococcal Polysaccharide 08/15/1997  . Tdap 05/04/2011   Prior to Admission medications   Medication Sig Start Date End Date Taking? Authorizing Provider  amLODipine (NORVASC) 5 MG tablet Take 5 mg by mouth daily.   Yes Historical Provider, MD  aspirin 81 MG tablet Take 81 mg by mouth daily.   Yes Historical  Provider, MD  ezetimibe (ZETIA) 10 MG tablet Take 1 tablet (10 mg total) by mouth daily. 01/05/13  Yes [name], MD  HYDROcodone-acetaminophen (NORCO/VICODIN) 5-325 MG per tablet TAKE ONE TABLET BY MOUTH EVERY 6 HOURS AS NEEDED FOR PAIN 01/01/13  Yes [name], MD  meclizine (ANTIVERT) 25 MG tablet Take 1 tablet (25 mg total) by mouth 3 (three) times daily as needed. 03/20/13  Yes [name], MD  Multiple Vitamins-Minerals (CENTRUM SILVER PO) Take by mouth.   Yes Historical Provider, MD  RESTASIS 0.05 % ophthalmic emulsion  01/31/13  Yes Historical Provider, MD  risperiDONE (RISPERDAL) 0.5 MG tablet Take 0.5 mg by mouth daily.   Yes Historical Provider, MD  Teriparatide, Recombinant, (FORTEO) 600  MCG/2.4ML SOLN Injected 20 mcg subcutaneously one time per day as directed 03/25/13  Yes [name], MD  valsartan-hydrochlorothiazide (DIOVAN-HCT) 80-12.5 MG per tablet Take 1 tablet by mouth daily.   Yes Historical Provider, MD     ROS: As above in the HPI. All other systems are stable or negative.  OBJECTIVE: APPEARANCE:  Patient in no acute distress.The patient appeared well nourished and normally developed. Acyanotic. Waist: VITAL SIGNS:BP 147/81  Pulse 104  Temp(Src) 97.7 F (36.5 C) (Oral)  Wt 159 lb 3.2 oz (72.213 kg)  BMI 32.14 kg/m2 African [place] Female. Recheck 135/80 SKIN: warm and  Dry without overt rashes, tattoos and scars  HEAD and Neck: without JVD, Head and scalp: normal Eyes:No scleral icterus. Fundi normal, eye movements normal. Ears: Auricle normal, canal normal, Tympanic membranes normal, insufflation normal. Nose: normal Throat: normal Neck & thyroid: normal  CHEST & LUNGS: Chest wall: normal Lungs: Clear  CVS: Reveals the PMI to be normally located. Regular rhythm, First and Second Heart sounds are normal,  absence of murmurs, rubs or gallops. Peripheral vasculature: Radial pulses: normal Dorsal pedis pulses: normal Posterior pulses: normal  ABDOMEN:  Appearance: obese Benign, no organomegaly, no masses, no Abdominal Aortic enlargement. No Guarding , no rebound. No Bruits. Bowel sounds: normal  RECTAL: N/A GU: N/A  EXTREMETIES: nonedematous.  MUSCULOSKELETAL:  Spine: normal Joints: intact  NEUROLOGIC: oriented to time,place and person; nonfocal. Strength is normal Sensory is normal Reflexes are normal Cranial Nerves are normal.  Results for orders placed in visit on 67/54/49  BASIC METABOLIC PANEL WITH GFR      Result Value Range   Sodium 142  135 - 145 mEq/L   Potassium 4.1  3.5 - 5.3 mEq/L   Chloride 104  96 - 112 mEq/L   CO2 28  19 - 32 mEq/L   Glucose, Bld 101 (*) 70 - 99 mg/dL   BUN 15  6 - 23 mg/dL   Creat  0.96  0.50 - 1.10 mg/dL   Calcium 10.2  8.4 - 10.5 mg/dL   GFR, Est African American 65     GFR, Est Non African American 56 (*)   HEPATIC FUNCTION PANEL      Result Value Range   Total Bilirubin 0.6  0.3 - 1.2 mg/dL   Bilirubin, Direct 0.1  0.0 - 0.3 mg/dL   Indirect Bilirubin 0.5  0.0 - 0.9 mg/dL   Alkaline Phosphatase 71  39 - 117 U/L   AST 24  0 - 37 U/L   ALT 13  0 - 35 U/L   Total Protein 6.4  6.0 - 8.3 g/dL   Albumin 4.6  3.5 - 5.2 g/dL  NMR LIPOPROFILE WITH LIPIDS  Result Value Range   LDL Particle Number [id number] (*) <1000 nmol/L   LDL (calc) 128 (*) <100 mg/dL   HDL-C 66  >=40 mg/dL   Triglycerides 76  <150 mg/dL   Cholesterol, Total 209 (*) <200 mg/dL   HDL Particle Number 29.3 (*) >=30.5 umol/L   Large HDL-P 14.1  >=4.8 umol/L   Large VLDL-P <0.8  <=2.7 nmol/L   Small LDL Particle Number <90  <=527 nmol/L   LDL Size 21.7  >20.5 nm   HDL Size 10.4  >=9.2 nm   VLDL Size 41.3  <=46.6 nm   LP-IR Score <25  <=45  VITAMIN D 25 HYDROXY      Result Value Range   Vit D, 25-Hydroxy 36  30 - 89 ng/mL    ASSESSMENT: Benign paroxysmal positional vertigo - Plan: meclizine (ANTIVERT) 25 MG tablet  Osteoporosis, unspecified - Plan: HYDROcodone-acetaminophen (NORCO/VICODIN) 5-325 MG per tablet  Chronic back pain - Plan: HYDROcodone-acetaminophen (NORCO/VICODIN) 5-325 MG per tablet  Vertebral compression fracture, sequela - Plan: HYDROcodone-acetaminophen (NORCO/VICODIN) 5-325 MG per tablet  HTN (hypertension) - Plan: valsartan-hydrochlorothiazide (DIOVAN-HCT) 80-12.5 MG per tablet  HLD (hyperlipidemia) - Plan: ezetimibe (ZETIA) 10 MG tablet, CMP14+EGFR, NMR, lipoprofile  CKD (chronic kidney disease)  Seasonal allergic rhinitis   PLAN: Orders Placed This Encounter  Procedures  . CMP14+EGFR  . NMR, lipoprofile    Meds ordered this encounter  Medications  . RESTASIS 0.05 % ophthalmic emulsion    Sig:   . DISCONTD: amLODipine (NORVASC) 5 MG tablet    Sig:   .  meclizine (ANTIVERT) 25 MG tablet    Sig: Take 1 tablet (25 mg total) by mouth 3 (three) times daily as needed.    Dispense:  90 tablet    Refill:  0  . ezetimibe (ZETIA) 10 MG tablet    Sig: Take 1 tablet (10 mg total) by mouth daily.    Dispense:  90 tablet    Refill:  3  . risperiDONE (RISPERDAL) 0.5 MG tablet    Sig: Take 1 tablet (0.5 mg total) by mouth daily.    Dispense:  90 tablet    Refill:  3  . valsartan-hydrochlorothiazide (DIOVAN-HCT) 80-12.5 MG per tablet    Sig: Take 1 tablet by mouth daily.    Dispense:  90 tablet    Refill:  3  . HYDROcodone-acetaminophen (NORCO/VICODIN) 5-325 MG per tablet    Sig: TAKE ONE TABLET BY MOUTH EVERY 6 HOURS AS NEEDED FOR PAIN    Dispense:  60 tablet    Refill:  1    Continue diet and exercise. Keeping active.  Return in about 3 months (around 07/06/2013) for Recheck medical problems.  Dayleen Beske P. Jacelyn Grip, M.D.

## 2013-04-07 LAB — CMP14+EGFR
ALT: 13 IU/L (ref 0–32)
AST: 25 IU/L (ref 0–40)
Albumin/Globulin Ratio: 2.5 (ref 1.1–2.5)
Albumin: 4.7 g/dL (ref 3.5–4.7)
Alkaline Phosphatase: 70 IU/L (ref 39–117)
BUN/Creatinine Ratio: 16 (ref 11–26)
BUN: 17 mg/dL (ref 8–27)
CO2: 26 mmol/L (ref 18–29)
Calcium: 10.6 mg/dL — ABNORMAL HIGH (ref 8.6–10.2)
Chloride: 100 mmol/L (ref 97–108)
Creatinine, Ser: 1.07 mg/dL — ABNORMAL HIGH (ref 0.57–1.00)
GFR calc Af Amer: 57 mL/min/{1.73_m2} — ABNORMAL LOW (ref >59)
GFR calc non Af Amer: 49 mL/min/{1.73_m2} — ABNORMAL LOW (ref >59)
Globulin, Total: 1.9 g/dL (ref 1.5–4.5)
Glucose: 105 mg/dL — ABNORMAL HIGH (ref 65–99)
Potassium: 4 mmol/L (ref 3.5–5.2)
Sodium: 142 mmol/L (ref 134–144)
Total Bilirubin: 0.5 mg/dL (ref 0.0–1.2)
Total Protein: 6.6 g/dL (ref 6.0–8.5)

## 2013-04-07 LAB — NMR, LIPOPROFILE
Cholesterol: 209 mg/dL — ABNORMAL HIGH (ref <200)
HDL Cholesterol by NMR: 75 mg/dL (ref >=40)
HDL Particle Number: 33.2 umol/L (ref >=30.5)
LDL Particle Number: 1674 nmol/L — ABNORMAL HIGH (ref <1000)
LDL Size: 21.4 nm (ref >20.5)
LDLC SERPL CALC-MCNC: 116 mg/dL — ABNORMAL HIGH (ref <100)
LP-IR Score: <25 (ref <=45)
Small LDL Particle Number: 147 nmol/L (ref <=527)
Triglycerides by NMR: 89 mg/dL (ref <150)

## 2013-04-10 NOTE — Progress Notes (Signed)
Quick Note:  Lab result at or close to goal. The high LDLc is offset by the high HDLc. No change in Medications for now. No Change in plans and follow up. ______

## 2013-05-09 ENCOUNTER — Other Ambulatory Visit: Payer: Self-pay | Admitting: Family Medicine

## 2013-05-13 NOTE — Telephone Encounter (Signed)
Prescription renewed in EPIC. 

## 2013-05-13 NOTE — Telephone Encounter (Signed)
rx called to [place]

## 2013-06-26 ENCOUNTER — Other Ambulatory Visit: Payer: Self-pay | Admitting: Family Medicine

## 2013-06-26 DIAGNOSIS — M810 Age-related osteoporosis without current pathological fracture: Secondary | ICD-10-CM

## 2013-06-27 ENCOUNTER — Inpatient Hospital Stay
Admission: RE | Admit: 2013-06-27 | Discharge: 2013-06-27 | Disposition: A | Discharge status: Home / Self Care | Payer: Self-pay | Source: Ambulatory Visit | Attending: Family Medicine | Admitting: Family Medicine

## 2013-06-27 DIAGNOSIS — IMO0001 Reserved for inherently not codable concepts without codable children: Secondary | ICD-10-CM

## 2013-06-27 DIAGNOSIS — M810 Age-related osteoporosis without current pathological fracture: Secondary | ICD-10-CM

## 2013-06-27 NOTE — Telephone Encounter (Signed)
Patient start Forteo around 07/2011 or 08/2011.  She is nearing end of treatment.  She is also due to recheck DEXA.  Will refill Forteo 2 more times and send referral for DEXA.

## 2013-06-27 NOTE — Telephone Encounter (Signed)
Kristin [name] CAN YOU REVIEW AND ORDER APPROPRIATELY. THANKS.

## 2013-06-28 ENCOUNTER — Other Ambulatory Visit: Payer: Self-pay | Admitting: *Deleted

## 2013-06-28 MED ORDER — AMLODIPINE BESYLATE 5 MG PO TABS: 5.0000 mg | ORAL_TABLET | Freq: Every day | ORAL | Status: DC

## 2013-07-09 ENCOUNTER — Encounter: Payer: Self-pay | Admitting: Family Medicine

## 2013-07-09 ENCOUNTER — Ambulatory Visit (INDEPENDENT_AMBULATORY_CARE_PROVIDER_SITE_OTHER): Payer: Medicare Other | Admitting: Family Medicine

## 2013-07-09 VITALS — BP 171/90 | HR 98 | Temp 97.5°F | Ht 59.0 in | Wt 161.0 lb

## 2013-07-09 DIAGNOSIS — E785 Hyperlipidemia, unspecified: Secondary | ICD-10-CM

## 2013-07-09 DIAGNOSIS — M549 Dorsalgia, unspecified: Secondary | ICD-10-CM

## 2013-07-09 DIAGNOSIS — J309 Allergic rhinitis, unspecified: Secondary | ICD-10-CM

## 2013-07-09 DIAGNOSIS — M1710 Unilateral primary osteoarthritis, unspecified knee: Secondary | ICD-10-CM

## 2013-07-09 DIAGNOSIS — J302 Other seasonal allergic rhinitis: Secondary | ICD-10-CM

## 2013-07-09 DIAGNOSIS — M810 Age-related osteoporosis without current pathological fracture: Secondary | ICD-10-CM

## 2013-07-09 DIAGNOSIS — G8929 Other chronic pain: Secondary | ICD-10-CM

## 2013-07-09 DIAGNOSIS — N189 Chronic kidney disease, unspecified: Secondary | ICD-10-CM

## 2013-07-09 DIAGNOSIS — IMO0002 Reserved for concepts with insufficient information to code with codable children: Secondary | ICD-10-CM

## 2013-07-09 DIAGNOSIS — Z23 Encounter for immunization: Secondary | ICD-10-CM

## 2013-07-09 DIAGNOSIS — M8448XD Pathological fracture, other site, subsequent encounter for fracture with routine healing: Secondary | ICD-10-CM

## 2013-07-09 DIAGNOSIS — M1712 Unilateral primary osteoarthritis, left knee: Secondary | ICD-10-CM | POA: Insufficient documentation

## 2013-07-09 DIAGNOSIS — IMO0001 Reserved for inherently not codable concepts without codable children: Secondary | ICD-10-CM

## 2013-07-09 DIAGNOSIS — I10 Essential (primary) hypertension: Secondary | ICD-10-CM

## 2013-07-09 MED ORDER — HYDROCODONE-ACETAMINOPHEN 5-325 MG PO TABS: ORAL_TABLET | ORAL | Status: DC

## 2013-07-09 MED ORDER — RISPERIDONE 0.5 MG PO TABS: 0.5000 mg | ORAL_TABLET | Freq: Every day | ORAL | Status: DC

## 2013-07-09 NOTE — Progress Notes (Signed)
Patient ID: Kristin [name], female   DOB: 1930-12-27, 79 y.o.   MRN: [id number] SUBJECTIVE: CC: Chief Complaint  Patient presents with  . Follow-up    3 month follow up chronic problems     HPI: Patient is here for follow up of hypertension/HH/Osteoporosis: denies Headache;deniesChest Pain;denies weakness;denies Shortness of Breath or Orthopnea;denies Visual changes;denies palpitations;denies cough;denies pedal edema;denies symptoms of TIA or stroke; admits to Compliance with medications. denies Problems with medications.   Left knee arthritis flare needs a shot of steroids in the knee.   Past Medical History  Diagnosis Date  . Hypertension   . Obesity   . Allergy   . Osteoporosis   . Glaucoma   . Hiatal hernia    No past surgical history on file. History   Social History  . Marital Status: Widowed    Spouse Name: N/A    Number of Children: N/A  . Years of Education: N/A   Occupational History  . Not on file.   Social History Main Topics  . Smoking status: Never Smoker   . Smokeless tobacco: Not on file  . Alcohol Use: Not on file  . Drug Use: Not on file  . Sexual Activity: Not on file   Other Topics Concern  . Not on file   Social History Narrative  . No narrative on file   No family history on file. Current Outpatient Prescriptions on File Prior to Visit  Medication Sig Dispense Refill  . amLODipine (NORVASC) 5 MG tablet Take 1 tablet (5 mg total) by mouth daily.  90 tablet  0  . aspirin 81 MG tablet Take 81 mg by mouth daily.      Marland Kitchen ezetimibe (ZETIA) 10 MG tablet Take 1 tablet (10 mg total) by mouth daily.  90 tablet  3  . meclizine (ANTIVERT) 25 MG tablet Take 1 tablet (25 mg total) by mouth 3 (three) times daily as needed.  90 tablet  0  . Multiple Vitamins-Minerals (CENTRUM SILVER PO) Take by mouth.      . RESTASIS 0.05 % ophthalmic emulsion       . Teriparatide, Recombinant, (FORTEO) 600 MCG/2.4ML SOLN Inject 0.08 mLs (20 mcg total) into the skin  daily.  4.8 mL  0  . valsartan-hydrochlorothiazide (DIOVAN-HCT) 80-12.5 MG per tablet Take 1 tablet by mouth daily.  90 tablet  3   No current facility-administered medications on file prior to visit.   Allergies  Allergen Reactions  . Penicillins     REACTION: rash  . Sulfonamide Derivatives     REACTION: itching, rash   Immunization History  Administered Date(s) Administered  . Influenza,inj,Quad PF,36+ Mos 07/09/2013  . Pneumococcal Polysaccharide-23 08/15/1997  . Tdap 05/04/2011   Prior to Admission medications   Medication Sig Start Date End Date Taking? Authorizing Provider  amLODipine (NORVASC) 5 MG tablet Take 1 tablet (5 mg total) by mouth daily. 06/28/13   [name], MD  aspirin 81 MG tablet Take 81 mg by mouth daily.    Historical Provider, MD  ezetimibe (ZETIA) 10 MG tablet Take 1 tablet (10 mg total) by mouth daily. 04/05/13   [name], MD  HYDROcodone-acetaminophen (NORCO/VICODIN) 5-325 MG per tablet TAKE ONE TABLET BY MOUTH EVERY 6 HOURS AS NEEDED FOR PAIN 04/05/13   [name], MD  meclizine (ANTIVERT) 25 MG tablet Take 1 tablet (25 mg total) by mouth 3 (three) times daily as needed. 04/05/13   [name], MD  Multiple Vitamins-Minerals (  CENTRUM SILVER PO) Take by mouth.    Historical Provider, MD  RESTASIS 0.05 % ophthalmic emulsion  01/31/13   Historical Provider, MD  risperiDONE (RISPERDAL) 0.5 MG tablet Take 1 tablet (0.5 mg total) by mouth daily. 04/05/13   [name], MD  Teriparatide, Recombinant, (FORTEO) 600 MCG/2.4ML SOLN Inject 0.08 mLs (20 mcg total) into the skin daily. 06/26/13   [name], MD  valsartan-hydrochlorothiazide (DIOVAN-HCT) 80-12.5 MG per tablet Take 1 tablet by mouth daily. 04/05/13   [name], MD     ROS: As above in the HPI. All other systems are stable or negative.  OBJECTIVE: APPEARANCE:  Patient in no acute distress.The patient appeared well nourished and normally developed. Acyanotic. Waist: VITAL  SIGNS:BP 171/90  Pulse 98  Temp(Src) 97.5 F (36.4 C) (Oral)  Ht 4' 11"  (1.499 m)  Wt 161 lb (73.029 kg)  BMI 32.50 kg/m2  Obese [place] african Female  SKIN: warm and  Dry without overt rashes, tattoos and scars  HEAD and Neck: without JVD, Head and scalp: normal Eyes:No scleral icterus. Fundi normal, eye movements normal. Ears: Auricle normal, canal normal, Tympanic membranes normal, insufflation normal. Nose: normal Throat: normal Neck & thyroid: normal  CHEST & LUNGS: Chest wall: normal Lungs: Clear  CVS: Reveals the PMI to be normally located. Regular rhythm, First and Second Heart sounds are normal,  absence of murmurs, rubs or gallops. Peripheral vasculature: Radial pulses: normal Dorsal pedis pulses: normal Posterior pulses: normal  ABDOMEN:  Appearance: normal Benign, no organomegaly, no masses, no Abdominal Aortic enlargement. No Guarding , no rebound. No Bruits. Bowel sounds: normal  RECTAL: N/A GU: N/A  EXTREMETIES: nonedematous.  MUSCULOSKELETAL:  Spine: normal Joints: left knee crepitus and painful with reduced ROM.  NEUROLOGIC: oriented to time,place and person; nonfocal.  ASSESSMENT:   HTN (hypertension) - Plan: CMP14+EGFR  Chronic back pain - Plan: HYDROcodone-acetaminophen (NORCO/VICODIN) 5-325 MG per tablet  Arthritis of knee, left  CKD (chronic kidney disease)  HLD (hyperlipidemia) - Plan: CMP14+EGFR, NMR, lipoprofile  Need for prophylactic vaccination and inoculation against influenza  Osteoporosis, unspecified - Plan: HYDROcodone-acetaminophen (NORCO/VICODIN) 5-325 MG per tablet  Seasonal allergic rhinitis  Vertebral compression fracture, with routine healing, subsequent encounter  Vertebral compression fracture, sequela - Plan: HYDROcodone-acetaminophen (NORCO/VICODIN) 5-325 MG per tablet  PLAN:   Procedure: left knee steroid injection. Anesthesia: ethyl chloride. Antiseptic: betadine.  Procedure: the left knee  was cleansed with betadine laterally and a lateral paproach was made to inject 0.5 cc of marcaine, 1 cc of Lidocaine and 0.5 cc of K40. With excellent effect.  No blood loss or complications. Patient was fine with procedure.  Standard precautions per procedure given.   Orders Placed This Encounter  Procedures  . CMP14+EGFR  . NMR, lipoprofile   Meds ordered this encounter  Medications  . HYDROcodone-acetaminophen (NORCO/VICODIN) 5-325 MG per tablet    Sig: TAKE ONE TABLET BY MOUTH EVERY 6 HOURS AS NEEDED FOR PAIN    Dispense:  90 tablet    Refill:  0  . risperiDONE (RISPERDAL) 0.5 MG tablet    Sig: Take 1 tablet (0.5 mg total) by mouth daily.    Dispense:  90 tablet    Refill:  3   Medications Discontinued During This Encounter  Medication Reason  . HYDROcodone-acetaminophen (NORCO/VICODIN) 5-325 MG per tablet Reorder  . risperiDONE (RISPERDAL) 0.5 MG tablet Reorder   Return in about 3 months (around 10/09/2013) for Recheck medical problems.  Arshi Duarte P. Jacelyn Grip, M.D.

## 2013-07-11 LAB — CMP14+EGFR
ALT: 12 IU/L (ref 0–32)
AST: 28 IU/L (ref 0–40)
Albumin/Globulin Ratio: 2.6 — ABNORMAL HIGH (ref 1.1–2.5)
Albumin: 4.7 g/dL (ref 3.5–4.7)
Alkaline Phosphatase: 62 IU/L (ref 39–117)
BUN/Creatinine Ratio: 18 (ref 11–26)
BUN: 18 mg/dL (ref 8–27)
CO2: 24 mmol/L (ref 18–29)
Calcium: 10.4 mg/dL — ABNORMAL HIGH (ref 8.6–10.2)
Chloride: 103 mmol/L (ref 97–108)
Creatinine, Ser: 0.99 mg/dL (ref 0.57–1.00)
GFR calc Af Amer: 62 mL/min/{1.73_m2} (ref >59)
GFR calc non Af Amer: 54 mL/min/{1.73_m2} — ABNORMAL LOW (ref >59)
Globulin, Total: 1.8 g/dL (ref 1.5–4.5)
Glucose: 102 mg/dL — ABNORMAL HIGH (ref 65–99)
Potassium: 3.8 mmol/L (ref 3.5–5.2)
Sodium: 145 mmol/L — ABNORMAL HIGH (ref 134–144)
Total Bilirubin: 0.5 mg/dL (ref 0.0–1.2)
Total Protein: 6.5 g/dL (ref 6.0–8.5)

## 2013-07-11 LAB — NMR, LIPOPROFILE
Cholesterol: 179 mg/dL (ref <200)
HDL Cholesterol by NMR: 69 mg/dL (ref >=40)
HDL Particle Number: 33.5 umol/L (ref >=30.5)
LDL Particle Number: 1342 nmol/L — ABNORMAL HIGH (ref <1000)
LDL Size: 21.3 nm (ref >20.5)
LDLC SERPL CALC-MCNC: 94 mg/dL (ref <100)
LP-IR Score: <25 (ref <=45)
Small LDL Particle Number: 132 nmol/L (ref <=527)
Triglycerides by NMR: 80 mg/dL (ref <150)

## 2013-08-06 ENCOUNTER — Telehealth: Payer: Self-pay | Admitting: *Deleted

## 2013-08-06 NOTE — Telephone Encounter (Signed)
Message copied by Priscille Heidelberg on Tue Aug 06, 2013  3:03 PM ------      Message from: [name]      Created: Sun Jul 14, 2013  7:59 PM       Call Patient      Lab result at goal and  stable      No change in Medications for now.      No Change in recommendations and plans for follow up. ------

## 2013-10-10 ENCOUNTER — Ambulatory Visit: Payer: Medicare Other | Admitting: Family Medicine

## 2013-11-06 ENCOUNTER — Telehealth: Payer: Self-pay | Admitting: Family Medicine

## 2013-11-06 MED ORDER — AMLODIPINE BESYLATE 5 MG PO TABS
5.0000 mg | ORAL_TABLET | Freq: Every day | ORAL | Status: DC
Start: 2013-11-06 — End: 2014-02-19

## 2013-11-06 NOTE — Telephone Encounter (Signed)
done

## 2013-11-26 ENCOUNTER — Encounter: Payer: Self-pay | Admitting: Family Medicine

## 2013-11-26 ENCOUNTER — Ambulatory Visit (INDEPENDENT_AMBULATORY_CARE_PROVIDER_SITE_OTHER): Payer: Medicare Other | Admitting: Family Medicine

## 2013-11-26 VITALS — BP 148/85 | HR 116 | Temp 97.8°F | Ht 59.0 in | Wt 162.0 lb

## 2013-11-26 DIAGNOSIS — J302 Other seasonal allergic rhinitis: Secondary | ICD-10-CM

## 2013-11-26 DIAGNOSIS — M1710 Unilateral primary osteoarthritis, unspecified knee: Secondary | ICD-10-CM

## 2013-11-26 DIAGNOSIS — M549 Dorsalgia, unspecified: Secondary | ICD-10-CM

## 2013-11-26 DIAGNOSIS — IMO0002 Reserved for concepts with insufficient information to code with codable children: Secondary | ICD-10-CM

## 2013-11-26 DIAGNOSIS — N189 Chronic kidney disease, unspecified: Secondary | ICD-10-CM

## 2013-11-26 DIAGNOSIS — J309 Allergic rhinitis, unspecified: Secondary | ICD-10-CM

## 2013-11-26 DIAGNOSIS — R42 Dizziness and giddiness: Secondary | ICD-10-CM

## 2013-11-26 DIAGNOSIS — M1712 Unilateral primary osteoarthritis, left knee: Secondary | ICD-10-CM

## 2013-11-26 DIAGNOSIS — I10 Essential (primary) hypertension: Secondary | ICD-10-CM

## 2013-11-26 DIAGNOSIS — R5383 Other fatigue: Secondary | ICD-10-CM

## 2013-11-26 DIAGNOSIS — M4850XA Collapsed vertebra, not elsewhere classified, site unspecified, initial encounter for fracture: Secondary | ICD-10-CM

## 2013-11-26 DIAGNOSIS — E669 Obesity, unspecified: Secondary | ICD-10-CM | POA: Insufficient documentation

## 2013-11-26 DIAGNOSIS — R5381 Other malaise: Secondary | ICD-10-CM | POA: Insufficient documentation

## 2013-11-26 DIAGNOSIS — M810 Age-related osteoporosis without current pathological fracture: Secondary | ICD-10-CM

## 2013-11-26 DIAGNOSIS — E785 Hyperlipidemia, unspecified: Secondary | ICD-10-CM

## 2013-11-26 DIAGNOSIS — G8929 Other chronic pain: Secondary | ICD-10-CM

## 2013-11-26 LAB — POCT CBC
Granulocyte percent: 56.6 %G (ref 37–80)
HCT, POC: 39.9 % (ref 37.7–47.9)
Hemoglobin: 12.7 g/dL (ref 12.2–16.2)
Lymph, poc: 1.8 (ref 0.6–3.4)
MCH, POC: 32.1 pg — AB (ref 27–31.2)
MCHC: 31.8 g/dL (ref 31.8–35.4)
MCV: 100.9 fL — AB (ref 80–97)
MPV: 7 fL (ref 0–99.8)
POC Granulocyte: 2.9 (ref 2–6.9)
POC LYMPH PERCENT: 34.6 %L (ref 10–50)
Platelet Count, POC: 258 10*3/uL (ref 142–424)
RBC: 4 M/uL — AB (ref 4.04–5.48)
RDW, POC: 12.6 %
WBC: 5.2 10*3/uL (ref 4.6–10.2)

## 2013-11-26 NOTE — Progress Notes (Signed)
Patient ID: Kristin [name], female   DOB: 1931-01-15, 80 y.o.   MRN: [id number] SUBJECTIVE: CC: Chief Complaint  Patient presents with  . Follow-up    3 month follow up c/o arthritis pain bothering her.  states did not take BP med this am    HPI: Patient is here for follow up of hypertension/HLD/CKD: denies Headache;deniesChest Pain;denies weakness;denies Shortness of Breath or Orthopnea;denies Visual changes;denies palpitations;denies cough;denies pedal edema;denies symptoms of TIA or stroke; admits to Compliance with medications. denies Problems with medications.  Osteoporosis with h/o vertebral fracture  Had a rough winter due to he cold her arthritis was terrible. Travelled to [place] and [place] and was fine. When she got back here, her body couldn't take the cold.  Past Medical History  Diagnosis Date  . Hypertension   . Obesity   . Allergy   . Osteoporosis   . Glaucoma   . Hiatal hernia   . Vertigo    No past surgical history on file. History   Social History  . Marital Status: Widowed    Spouse Name: N/A    Number of Children: N/A  . Years of Education: N/A   Occupational History  . Not on file.   Social History Main Topics  . Smoking status: Never Smoker   . Smokeless tobacco: Not on file  . Alcohol Use: Not on file  . Drug Use: Not on file  . Sexual Activity: Not on file   Other Topics Concern  . Not on file   Social History Narrative  . No narrative on file   No family history on file. Current Outpatient Prescriptions on File Prior to Visit  Medication Sig Dispense Refill  . amLODipine (NORVASC) 5 MG tablet Take 1 tablet (5 mg total) by mouth daily.  90 tablet  0  . aspirin 81 MG tablet Take 81 mg by mouth daily.      Marland Kitchen ezetimibe (ZETIA) 10 MG tablet Take 1 tablet (10 mg total) by mouth daily.  90 tablet  3  . HYDROcodone-acetaminophen (NORCO/VICODIN) 5-325 MG per tablet TAKE ONE TABLET BY MOUTH EVERY 6 HOURS AS NEEDED FOR PAIN  90 tablet  0  .  meclizine (ANTIVERT) 25 MG tablet Take 1 tablet (25 mg total) by mouth 3 (three) times daily as needed.  90 tablet  0  . Multiple Vitamins-Minerals (CENTRUM SILVER PO) Take by mouth.      . RESTASIS 0.05 % ophthalmic emulsion       . risperiDONE (RISPERDAL) 0.5 MG tablet Take 1 tablet (0.5 mg total) by mouth daily.  90 tablet  3  . Teriparatide, Recombinant, (FORTEO) 600 MCG/2.4ML SOLN Inject 0.08 mLs (20 mcg total) into the skin daily.  4.8 mL  0  . valsartan-hydrochlorothiazide (DIOVAN-HCT) 80-12.5 MG per tablet Take 1 tablet by mouth daily.  90 tablet  3   No current facility-administered medications on file prior to visit.   Allergies  Allergen Reactions  . Penicillins     REACTION: rash  . Sulfonamide Derivatives     REACTION: itching, rash   Immunization History  Administered Date(s) Administered  . Influenza,inj,Quad PF,36+ Mos 07/09/2013  . Pneumococcal Polysaccharide-23 08/15/1997  . Tdap 05/04/2011   Prior to Admission medications   Medication Sig Start Date End Date Taking? Authorizing Provider  amLODipine (NORVASC) 5 MG tablet Take 1 tablet (5 mg total) by mouth daily. 11/06/13  Yes [name], MD  aspirin 81 MG tablet Take 81 mg by mouth daily.  Yes Historical Provider, MD  ezetimibe (ZETIA) 10 MG tablet Take 1 tablet (10 mg total) by mouth daily. 04/05/13  Yes [name], MD  HYDROcodone-acetaminophen (NORCO/VICODIN) 5-325 MG per tablet TAKE ONE TABLET BY MOUTH EVERY 6 HOURS AS NEEDED FOR PAIN 07/09/13  Yes [name], MD  meclizine (ANTIVERT) 25 MG tablet Take 1 tablet (25 mg total) by mouth 3 (three) times daily as needed. 04/05/13  Yes [name], MD  Multiple Vitamins-Minerals (CENTRUM SILVER PO) Take by mouth.   Yes Historical Provider, MD  RESTASIS 0.05 % ophthalmic emulsion  01/31/13  Yes Historical Provider, MD  risperiDONE (RISPERDAL) 0.5 MG tablet Take 1 tablet (0.5 mg total) by mouth daily. 07/09/13  Yes [name], MD  Teriparatide,  Recombinant, (FORTEO) 600 MCG/2.4ML SOLN Inject 0.08 mLs (20 mcg total) into the skin daily. 06/26/13  Yes [name], MD  valsartan-hydrochlorothiazide (DIOVAN-HCT) 80-12.5 MG per tablet Take 1 tablet by mouth daily. 04/05/13  Yes [name], MD     ROS: As above in the HPI. All other systems are stable or negative.  OBJECTIVE: APPEARANCE:  Patient in no acute distress.The patient appeared well nourished and normally developed. Acyanotic. Waist: VITAL SIGNS:BP 148/85  Pulse 116  Temp(Src) 97.8 F (36.6 C) (Oral)  Ht 4' 11"  (1.499 m)  Wt 162 lb (73.483 kg)  BMI 32.70 kg/m2 Short stature caribbean F   SKIN: warm and  Dry without overt rashes, tattoos and scars  HEAD and Neck: without JVD, Head and scalp: normal Eyes:No scleral icterus. Fundi normal, eye movements normal. Ears: Auricle normal, canal normal, Tympanic membranes normal, insufflation normal. Nose: normal Throat: normal Neck & thyroid: normal  CHEST & LUNGS: Chest wall: normal Lungs: Clear  CVS: Reveals the PMI to be normally located. Regular rhythm, First and Second Heart sounds are normal,  absence of murmurs, rubs or gallops. Peripheral vasculature: Radial pulses: normal Dorsal pedis pulses: normal Posterior pulses: normal  ABDOMEN:  Appearance: normal Benign, no organomegaly, no masses, no Abdominal Aortic enlargement. No Guarding , no rebound. No Bruits. Bowel sounds: normal  RECTAL: N/A GU: N/A  EXTREMETIES: nonedematous.  MUSCULOSKELETAL:  Spine:Kyphosisl Joints: arthritic knees.  NEUROLOGIC: oriented to time,place and person; nonfocal. Strength is normal Sensory is normal Reflexes are normal Cranial Nerves are normal.  ASSESSMENT: Vertebral compression fracture - Plan: DG Bone Density  Osteoporosis, unspecified - Plan: Vit D  25 hydroxy (rtn osteoporosis monitoring), DG Bone Density  Seasonal allergic rhinitis  Arthritis of knee, left  Chronic back pain  CKD  (chronic kidney disease)  HLD (hyperlipidemia) - Plan: Lipid panel  HTN (hypertension) - Plan: CMP14+EGFR  Obesity  Vertigo  Other malaise and fatigue - Plan: POCT CBC, CMP14+EGFR, TSH, Vitamin B12, Folate  PLAN: Need to do a follow up DEXA ASAP to see the response  we had with the prolia.  Orders Placed This Encounter  Procedures  . DG Bone Density    Standing Status: Future     Number of Occurrences:      Standing Expiration Date: 01/27/2015    Order Specific Question:  Reason for Exam (SYMPTOM  OR DIAGNOSIS REQUIRED)    Answer:  osteoporosis on prolia    Order Specific Question:  Preferred imaging location?    Answer:  Internal  . CMP14+EGFR  . Lipid panel  . TSH  . Vitamin B12  . Vit D  25 hydroxy (rtn osteoporosis monitoring)  . Folate  . POCT CBC  DASH diet  No orders of the defined types were placed in this encounter.   There are no discontinued medications. Return in about 3 months (around 02/25/2014) for Recheck medical problems.  Rusti Arizmendi P. Jacelyn Grip, M.D.

## 2013-11-26 NOTE — Patient Instructions (Signed)
DASH Diet  The DASH diet stands for "Dietary Approaches to Stop Hypertension." It is a healthy eating plan that has been shown to reduce high blood pressure (hypertension) in as little as 14 days, while also possibly providing other significant health benefits. These other health benefits include reducing the risk of breast cancer after menopause and reducing the risk of type 2 diabetes, heart disease, colon cancer, and stroke. Health benefits also include weight loss and slowing kidney failure in patients with chronic kidney disease.   DIET GUIDELINES  · Limit salt (sodium). Your diet should contain less than 1500 mg of sodium daily.  · Limit refined or processed carbohydrates. Your diet should include mostly whole grains. Desserts and added sugars should be used sparingly.  · Include small amounts of heart-healthy fats. These types of fats include nuts, oils, and tub margarine. Limit saturated and trans fats. These fats have been shown to be harmful in the body.  CHOOSING FOODS   The following food groups are based on a 2000 calorie diet. See your Registered Dietitian for individual calorie needs.  Grains and Grain Products (6 to 8 servings daily)  · Eat More Often: Whole-wheat bread, brown rice, whole-grain or wheat pasta, quinoa, popcorn without added fat or salt (air popped).  · Eat Less Often: White bread, white pasta, white rice, cornbread.  Vegetables (4 to 5 servings daily)  · Eat More Often: Fresh, frozen, and canned vegetables. Vegetables may be raw, steamed, roasted, or grilled with a minimal amount of fat.  · Eat Less Often/Avoid: Creamed or fried vegetables. Vegetables in a cheese sauce.  Fruit (4 to 5 servings daily)  · Eat More Often: All fresh, canned (in natural juice), or frozen fruits. Dried fruits without added sugar. One hundred percent fruit juice (½ cup [237 mL] daily).  · Eat Less Often: Dried fruits with added sugar. Canned fruit in light or heavy syrup.  Lean Meats, Fish, and Poultry (2  servings or less daily. One serving is 3 to 4 oz [85-114 g]).  · Eat More Often: Ninety percent or leaner ground beef, tenderloin, sirloin. Round cuts of beef, chicken breast, turkey breast. All fish. Grill, bake, or broil your meat. Nothing should be fried.  · Eat Less Often/Avoid: Fatty cuts of meat, turkey, or chicken leg, thigh, or wing. Fried cuts of meat or fish.  Dairy (2 to 3 servings)  · Eat More Often: Low-fat or fat-free milk, low-fat plain or light yogurt, reduced-fat or part-skim cheese.  · Eat Less Often/Avoid: Milk (whole, 2%). Whole milk yogurt. Full-fat cheeses.  Nuts, Seeds, and Legumes (4 to 5 servings per week)  · Eat More Often: All without added salt.  · Eat Less Often/Avoid: Salted nuts and seeds, canned beans with added salt.  Fats and Sweets (limited)  · Eat More Often: Vegetable oils, tub margarines without trans fats, sugar-free gelatin. Mayonnaise and salad dressings.  · Eat Less Often/Avoid: Coconut oils, palm oils, butter, stick margarine, cream, half and half, cookies, candy, pie.  FOR MORE INFORMATION  The Dash Diet Eating Plan: www.dashdiet.org  Document Released: 07/21/2011 Document Revised: 10/24/2011 Document Reviewed: 07/21/2011  [employer]® Patient Information ©2014 [employer].

## 2013-11-27 LAB — CMP14+EGFR
ALT: 16 IU/L (ref 0–32)
AST: 28 IU/L (ref 0–40)
Albumin/Globulin Ratio: 2.6 — ABNORMAL HIGH (ref 1.1–2.5)
Albumin: 4.7 g/dL (ref 3.5–4.7)
Alkaline Phosphatase: 68 IU/L (ref 39–117)
BUN/Creatinine Ratio: 16 (ref 11–26)
BUN: 19 mg/dL (ref 8–27)
CO2: 27 mmol/L (ref 18–29)
Calcium: 10.2 mg/dL (ref 8.7–10.3)
Chloride: 102 mmol/L (ref 97–108)
Creatinine, Ser: 1.2 mg/dL — ABNORMAL HIGH (ref 0.57–1.00)
GFR calc Af Amer: 49 mL/min/{1.73_m2} — ABNORMAL LOW (ref >59)
GFR calc non Af Amer: 42 mL/min/{1.73_m2} — ABNORMAL LOW (ref >59)
Globulin, Total: 1.8 g/dL (ref 1.5–4.5)
Glucose: 103 mg/dL — ABNORMAL HIGH (ref 65–99)
Potassium: 4.5 mmol/L (ref 3.5–5.2)
Sodium: 146 mmol/L — ABNORMAL HIGH (ref 134–144)
Total Bilirubin: 0.5 mg/dL (ref 0.0–1.2)
Total Protein: 6.5 g/dL (ref 6.0–8.5)

## 2013-11-27 LAB — VITAMIN D 25 HYDROXY (VIT D DEFICIENCY, FRACTURES): Vit D, 25-Hydroxy: 40.8 ng/mL (ref 30.0–100.0)

## 2013-11-27 LAB — LIPID PANEL
Chol/HDL Ratio: 2.5 ratio units (ref 0.0–4.4)
Cholesterol, Total: 194 mg/dL (ref 100–199)
HDL: 78 mg/dL (ref >39)
LDL Calculated: 100 mg/dL — ABNORMAL HIGH (ref 0–99)
Triglycerides: 78 mg/dL (ref 0–149)
VLDL Cholesterol Cal: 16 mg/dL (ref 5–40)

## 2013-11-27 LAB — TSH: TSH: 1.41 u[IU]/mL (ref 0.450–4.500)

## 2013-11-27 LAB — FOLATE: Folate: >19.9 ng/mL (ref >3.0)

## 2013-11-27 LAB — VITAMIN B12: Vitamin B-12: 976 pg/mL — ABNORMAL HIGH (ref 211–946)

## 2013-12-01 ENCOUNTER — Other Ambulatory Visit: Payer: Self-pay | Admitting: Family Medicine

## 2013-12-03 NOTE — Telephone Encounter (Signed)
Call patient : Prescription refilled & sent to pharmacy in [place].

## 2013-12-10 ENCOUNTER — Ambulatory Visit: Payer: Medicare Other | Admitting: Family Medicine

## 2014-01-15 ENCOUNTER — Ambulatory Visit (INDEPENDENT_AMBULATORY_CARE_PROVIDER_SITE_OTHER): Payer: Medicare Other

## 2014-01-15 ENCOUNTER — Ambulatory Visit (INDEPENDENT_AMBULATORY_CARE_PROVIDER_SITE_OTHER): Payer: Medicare Other | Admitting: Pharmacist

## 2014-01-15 VITALS — BP 144/84 | HR 90 | Ht 59.0 in | Wt 163.0 lb

## 2014-01-15 DIAGNOSIS — M4850XA Collapsed vertebra, not elsewhere classified, site unspecified, initial encounter for fracture: Secondary | ICD-10-CM

## 2014-01-15 DIAGNOSIS — M810 Age-related osteoporosis without current pathological fracture: Secondary | ICD-10-CM

## 2014-01-15 DIAGNOSIS — M8000XA Age-related osteoporosis with current pathological fracture, unspecified site, initial encounter for fracture: Secondary | ICD-10-CM

## 2014-01-15 DIAGNOSIS — M8440XA Pathological fracture, unspecified site, initial encounter for fracture: Secondary | ICD-10-CM

## 2014-01-15 LAB — HM DEXA SCAN: HM Dexa Scan: NORMAL

## 2014-01-15 MED ORDER — ALENDRONATE SODIUM 70 MG PO TABS: 70.0000 mg | ORAL_TABLET | ORAL | Status: DC

## 2014-01-15 NOTE — Progress Notes (Signed)
Patient ID: Kristin [name], female   DOB: Dec 12, 1930, 80 y.o.   MRN: [id number]  [facility] Current Height: Height: 4' 11"  (149.9 cm)      Max Lifetime Height:  5' 0"  Current Weight: Weight: 163 lb (73.936 kg)       Ethnicity:African American  BP: BP: 144/84 mmHg     HR:  Pulse Rate: 90      HPI: Patient with osteoporosis  -has been taking Forteo for 2 years (her last dose was December 2014).  She has history vertebral frracture with vertebralplasty in 2012  Back Pain?  Yes       Kyphosis?  No Med(s) for Osteoporosis/Osteopenia: None Med(s) previously tried for Osteoporosis/Osteopenia:  Forteo for 2 years                                                             PMH: Age at menopause:  68's - surgical Hysterectomy?  Yes Oophorectomy?  No HRT? Yes - Former.  Type/duration: unsure Steroid Use?  No Thyroid med?  No History of cancer?  No History of digestive disorders (ie Crohn's)?  No Current or previous eating disorders?  No Last Vitamin D Result:  40.8 (11/27/2013) Last GFR Result:  49 (11/27/2013)   FH/SH: Family history of osteoporosis?  Yes - mother Parent with history of hip fracture?  No Family history of breast cancer?  No Exercise?  Yes - water aerobics - [employer] and light strengthening exercises Smoking?  No Alcohol?  No    Calcium Assessment Calcium Intake  # of servings/day  Calcium mg  Milk (8 oz) 1  x  300  = 332m  Yogurt (4 oz) 0 x  200 = 0  Cheese (1 oz) 1 x  200 = 200  Other Calcium sources   2581m Ca supplement MVI daily = 50035m Estimated calcium intake per day 1050m42m DEXA Results Date of Test T-Score for AP Spine L1-L4 T-Score for Total Left Hip T-Score for Total Right Hip  01/15/2014 1.4 -0.5 -0.9  04/26/2011 1.2 -1.0 -1.6             Assessment: Osteoporosis with history of vertebral fracture - increased BMD after 2 years of Forteo therapy  Recommendations: 1.  Start  alendronate (FOSAMAX)  2.  recommend calcium 1200mg85mily through supplementation or diet.  3.  continue weight bearing exercise - 30 minutes at least 4 days per week.   4.  Counseled and educated about fall risk and prevention.  Recheck DEXA:  2 years  Time spent counseling patient:  25 minutes  TammyCherre RobinsrmD, CPP

## 2014-01-15 NOTE — Patient Instructions (Signed)

## 2014-02-18 ENCOUNTER — Telehealth: Payer: Self-pay | Admitting: Family Medicine

## 2014-02-19 MED ORDER — AMLODIPINE BESYLATE 5 MG PO TABS: 5.0000 mg | ORAL_TABLET | Freq: Every day | ORAL | Status: DC

## 2014-02-19 MED ORDER — IBANDRONATE SODIUM 150 MG PO TABS: 150.0000 mg | ORAL_TABLET | ORAL | Status: DC

## 2014-02-19 NOTE — Telephone Encounter (Signed)
Fosamax in making her sick on her stomach. She wants something else. Wants amlodipine refill.

## 2014-02-19 NOTE — Telephone Encounter (Signed)
Called patient she was having nausea and hip pain with alendronate.  Discussed option of either boniva (monthly bisphosphonate) or evista.  Decided to try Boninva 170m 1 tablet monthly.  Also amlodipine refilled.  Patient aware.  I only gave 1 month supply of amlodpine because she is going [facility] [place]

## 2014-02-20 NOTE — Telephone Encounter (Signed)
ok 

## 2014-03-04 ENCOUNTER — Encounter: Payer: Self-pay | Admitting: *Deleted

## 2014-04-07 ENCOUNTER — Ambulatory Visit: Payer: Self-pay | Admitting: Family

## 2014-05-16 ENCOUNTER — Ambulatory Visit: Payer: Self-pay | Admitting: Family

## 2014-06-20 ENCOUNTER — Other Ambulatory Visit: Payer: Self-pay | Admitting: *Deleted

## 2014-06-20 MED ORDER — MECLIZINE HCL 25 MG PO TABS: ORAL_TABLET | ORAL | Status: DC

## 2014-06-20 NOTE — Telephone Encounter (Signed)
This is okay to refill 1------ this patient needs to make an appointment for future refills

## 2014-06-20 NOTE — Telephone Encounter (Signed)
Patient of Dr Jacelyn Grip. Last office visit was 11-26-13. Has cancelled and was a no show to recent appts. Please advise.

## 2015-01-20 ENCOUNTER — Encounter: Payer: Medicare Other | Admitting: Neurology

## 2015-10-05 ENCOUNTER — Encounter: Payer: Self-pay | Admitting: Internal Medicine

## 2016-01-27 ENCOUNTER — Encounter: Payer: Self-pay | Admitting: Gastroenterology

## 2016-06-14 ENCOUNTER — Ambulatory Visit
Admission: RE | Admit: 2016-06-14 | Discharge: 2016-06-14 | Disposition: A | Discharge status: Home / Self Care | Payer: Medicare Other | Source: Ambulatory Visit | Attending: Family Medicine | Admitting: Family Medicine

## 2016-06-14 ENCOUNTER — Other Ambulatory Visit: Payer: Self-pay | Admitting: Family Medicine

## 2016-06-14 DIAGNOSIS — M810 Age-related osteoporosis without current pathological fracture: Secondary | ICD-10-CM

## 2016-06-14 DIAGNOSIS — M25551 Pain in right hip: Secondary | ICD-10-CM

## 2016-06-18 ENCOUNTER — Emergency Department (HOSPITAL_COMMUNITY)
Admission: EM | Admit: 2016-06-18 | Discharge: 2016-06-19 | Disposition: A | Discharge status: Home / Self Care | Payer: Medicare Other | Attending: Emergency Medicine | Admitting: Emergency Medicine

## 2016-06-18 ENCOUNTER — Encounter (HOSPITAL_COMMUNITY): Payer: Self-pay | Admitting: Emergency Medicine

## 2016-06-18 ENCOUNTER — Emergency Department (HOSPITAL_COMMUNITY): Payer: Medicare Other

## 2016-06-18 DIAGNOSIS — Z7982 Long term (current) use of aspirin: Secondary | ICD-10-CM | POA: Diagnosis not present

## 2016-06-18 DIAGNOSIS — S39012A Strain of muscle, fascia and tendon of lower back, initial encounter: Secondary | ICD-10-CM | POA: Diagnosis not present

## 2016-06-18 DIAGNOSIS — I129 Hypertensive chronic kidney disease with stage 1 through stage 4 chronic kidney disease, or unspecified chronic kidney disease: Secondary | ICD-10-CM | POA: Insufficient documentation

## 2016-06-18 DIAGNOSIS — Y929 Unspecified place or not applicable: Secondary | ICD-10-CM | POA: Insufficient documentation

## 2016-06-18 DIAGNOSIS — Y999 Unspecified external cause status: Secondary | ICD-10-CM | POA: Diagnosis not present

## 2016-06-18 DIAGNOSIS — Y939 Activity, unspecified: Secondary | ICD-10-CM | POA: Insufficient documentation

## 2016-06-18 DIAGNOSIS — M62830 Muscle spasm of back: Secondary | ICD-10-CM | POA: Insufficient documentation

## 2016-06-18 DIAGNOSIS — X509XXA Other and unspecified overexertion or strenuous movements or postures, initial encounter: Secondary | ICD-10-CM | POA: Diagnosis not present

## 2016-06-18 DIAGNOSIS — S3992XA Unspecified injury of lower back, initial encounter: Secondary | ICD-10-CM | POA: Diagnosis present

## 2016-06-18 DIAGNOSIS — N189 Chronic kidney disease, unspecified: Secondary | ICD-10-CM | POA: Insufficient documentation

## 2016-06-18 DIAGNOSIS — T148XXA Other injury of unspecified body region, initial encounter: Secondary | ICD-10-CM

## 2016-06-18 MED ORDER — METHOCARBAMOL 500 MG PO TABS
1000.0000 mg | ORAL_TABLET | Freq: Once | ORAL | Status: AC
  Administered 2016-06-18: 1000 mg via ORAL
  Filled 2016-06-18: qty 2

## 2016-06-18 MED ORDER — ACETAMINOPHEN 500 MG PO TABS
1000.0000 mg | ORAL_TABLET | Freq: Once | ORAL | Status: AC
  Administered 2016-06-18: 1000 mg via ORAL
  Filled 2016-06-18: qty 2

## 2016-06-18 MED ORDER — FENTANYL CITRATE (PF) 100 MCG/2ML IJ SOLN
50.0000 ug | Freq: Once | INTRAMUSCULAR | Status: AC
  Administered 2016-06-18: 50 ug via INTRAMUSCULAR
  Filled 2016-06-18: qty 2

## 2016-06-18 NOTE — ED Provider Notes (Signed)
[facility] DEPT Provider Note   CSN: [id number] Arrival date & time: 06/18/16  2154     History   Chief Complaint Chief Complaint  Patient presents with  . Back Pain    HPI Kristin [name] is a 82 y.o. female.  HPI  82 year old female with a history of osteoporosis presents ED with 1 week of right-sided back pain. Patient is been seen for this by her PCP and treated for muscle spasm. Pain was exacerbated today when she bent over to put her shoes on. She reported that while trying to straighten up she felt a spasm in her back and since then has been in severe pain. Pain is on the right paraspinal muscles and radiates to the right hip. Denies any urinary or bowel incontinence. No radicular pain. No other physical complaints.  Past Medical History:  Diagnosis Date  . Allergy   . Glaucoma   . Hiatal hernia   . Hypertension   . Obesity   . Osteoporosis    vertebral fracture in 2012  . Vertigo     Patient Active Problem List   Diagnosis Date Noted  . Other malaise and fatigue 11/26/2013  . Vertigo   . Obesity   . Arthritis of knee, left 07/09/2013  . Need for prophylactic vaccination and inoculation against influenza 07/09/2013  . CKD (chronic kidney disease) 01/01/2013  . Osteoporosis with pathological fracture 01/01/2013  . HLD (hyperlipidemia) 01/01/2013  . HTN (hypertension) 01/01/2013  . Chronic back pain 01/01/2013  . Vertebral compression fracture ([place]) 01/01/2013  . Seasonal allergic rhinitis 01/01/2013    Past Surgical History:  Procedure Laterality Date  . ABDOMINAL HYSTERECTOMY     partial  . vertebralplasty  2012    OB History    No data available       Home Medications    Prior to Admission medications   Medication Sig Start Date End Date Taking? Authorizing Provider  acetaminophen (TYLENOL) 500 MG tablet Take 2 tablets (1,000 mg total) by mouth every 8 (eight) hours. Do not take more than 4000 mg of acetaminophen (Tylenol) in a 24-hour  period. Please note that other medicines that you may be prescribed may have Tylenol as well. 06/19/16 06/24/16  Fatima Blank, MD  amLODipine (NORVASC) 5 MG tablet Take 1 tablet (5 mg total) by mouth daily. 02/19/14   Cherre Robins, PharmD  aspirin 81 MG tablet Take 81 mg by mouth daily.    Historical Provider, MD  cyclobenzaprine (FLEXERIL) 5 MG tablet Take 1 tablet (5 mg total) by mouth 2 (two) times daily as needed for muscle spasms. 06/19/16 06/24/16  Fatima Blank, MD  ezetimibe (ZETIA) 10 MG tablet Take 1 tablet (10 mg total) by mouth daily. 04/05/13   [name], MD  HYDROcodone-acetaminophen (NORCO/VICODIN) 5-325 MG tablet Take 1 tablet by mouth every 8 (eight) hours as needed for severe pain (That is not improved by your scheduled acetaminophen regimen). Please do not exceed 4000 mg of acetaminophen (Tylenol) a 24-hour period. Please note that he may be prescribed additional medicine that contains acetaminophen. 06/19/16 06/22/16  Fatima Blank, MD  ibandronate (BONIVA) 150 MG tablet Take 1 tablet (150 mg total) by mouth every 30 (thirty) days. Take in the morning with a full glass of water, on an empty stomach, and do not take anything else by mouth or lie down for the next 30 min. 02/19/14   Cherre Robins, PharmD  meclizine (ANTIVERT) 25 MG tablet TAKE ONE TABLET  BY MOUTH THREE TIMES DAILY AS NEEDED 06/20/14   Chipper Herb, MD  Multiple Vitamins-Minerals (CENTRUM SILVER PO) Take by mouth.    Historical Provider, MD  RESTASIS 0.05 % ophthalmic emulsion  01/31/13   Historical Provider, MD  risperiDONE (RISPERDAL) 0.5 MG tablet Take 1 tablet (0.5 mg total) by mouth daily. 07/09/13   [name], MD  valsartan-hydrochlorothiazide (DIOVAN-HCT) 80-12.5 MG per tablet Take 1 tablet by mouth daily. 04/05/13   [name], MD    Family History Family History  Problem Relation Age of Onset  . Hypertension Mother   . Osteoporosis Mother     Social History Social History    Substance Use Topics  . Smoking status: Never Smoker  . Smokeless tobacco: Never Used  . Alcohol use No     Allergies   Penicillins; Sulfonamide derivatives; and Alendronate   Review of Systems Review of Systems Ten systems are reviewed and are negative for acute change except as noted in the HPI   Physical Exam Updated Vital Signs BP 140/68 (BP Location: Left Arm)   Pulse 91   Temp 99 F (37.2 C) (Oral)   Resp 18   Ht 5' (1.524 m)   Wt 158 lb (71.7 kg)   SpO2 95%   BMI 30.86 kg/m   Physical Exam  Constitutional: She is oriented to person, place, and time. She appears well-developed and well-nourished. No distress.  HENT:  Head: Normocephalic and atraumatic.  Nose: Nose normal.  Eyes: Conjunctivae and EOM are normal. Pupils are equal, round, and reactive to light. Right eye exhibits no discharge. Left eye exhibits no discharge. No scleral icterus.  Neck: Normal range of motion. Neck supple.  Cardiovascular: Normal rate and regular rhythm.  Exam reveals no gallop and no friction rub.   No murmur heard. Pulmonary/Chest: Effort normal and breath sounds normal. No stridor. No respiratory distress. She has no rales.  Abdominal: Soft. She exhibits no distension. There is no tenderness.  Musculoskeletal: She exhibits no edema.       Right hip: She exhibits tenderness.       Thoracic back: She exhibits tenderness.       Lumbar back: She exhibits tenderness.       Back:       Legs: Neurological: She is alert and oriented to person, place, and time.  Spine Exam: Strength: 5/5 throughout LE bilaterally (hip flexion/extension, adduction/abduction; knee flexion/extension; foot dorsiflexion/plantarflexion, inversion/eversion; great toe inversion) Sensation: Intact to light touch in proximal and distal LE bilaterally Reflexes: 2+ quadriceps and achilles reflexes    Skin: Skin is warm and dry. No rash noted. She is not diaphoretic. No erythema.  Psychiatric: She has a normal  mood and affect.  Vitals reviewed.    ED Treatments / Results  Labs (all labs ordered are listed, but only abnormal results are displayed) Labs Reviewed - No data to display  EKG  EKG Interpretation None       Radiology Dg Thoracic Spine 2 View  Addendum Date: 06/19/2016   ADDENDUM REPORT: 06/19/2016 00:01 ADDENDUM: On further evaluation, note that the patient's prior vertebroplasty is at T10, and the new chronic compression deformity is at T5. Electronically Signed   By: Garald Balding M.D.   On: 06/19/2016 00:01   Result Date: 06/19/2016 CLINICAL DATA:  Chronic right mid back pain.  Initial encounter. EXAM: THORACIC SPINE 2 VIEWS COMPARISON:  Thoracic radiographs performed 04/25/2016 FINDINGS: There is a chronic compression deformity at T4, new from  September. Vertebroplasty changes are again noted at T9. Mild degenerative change is noted at the upper lumbar spine. Clips are noted within the right upper quadrant, reflecting prior cholecystectomy. IMPRESSION: 1. Chronic compression deformity of T4 is new from September. 2. Changes of vertebroplasty again noted at T9, with mild degenerative change at the upper lumbar spine. Electronically Signed: By: Garald Balding M.D. On: 06/18/2016 23:55   Dg Lumbar Spine Complete  Result Date: 06/19/2016 CLINICAL DATA:  Chronic lower back pain, worse on the right. Initial encounter. EXAM: LUMBAR SPINE - COMPLETE 4+ VIEW COMPARISON:  MRI of the lumbar spine performed 04/14/2011 FINDINGS: There is no evidence of acute fracture or subluxation. Scattered lateral osteophytes are noted along the lower thoracic and lumbar spine. Endplate sclerotic change is noted at L1-L2, with associated vacuum phenomenon. There is grade 2 anterolisthesis of L5 on S1, reflecting underlying facet disease. The patient is status post vertebroplasty at T10. The visualized bowel gas pattern is unremarkable in appearance; air and stool are noted within the colon. The sacroiliac  joints are within normal limits. The liver is unremarkable in appearance. The patient is status post cholecystectomy, with clips noted at the gallbladder fossa. The common bile duct remains normal in caliber. IMPRESSION: 1. No evidence of acute fracture or subluxation along the lumbar spine. 2. Grade 2 anterolisthesis of L5 on S1, reflecting underlying facet disease. Electronically Signed   By: Garald Balding M.D.   On: 06/18/2016 23:59    Procedures Procedures (including critical care time)  Medications Ordered in ED Medications  acetaminophen (TYLENOL) tablet 1,000 mg (1,000 mg Oral Given 06/18/16 2256)  methocarbamol (ROBAXIN) tablet 1,000 mg (1,000 mg Oral Given 06/18/16 2256)  fentaNYL (SUBLIMAZE) injection 50 mcg (50 mcg Intramuscular Given 06/18/16 2258)     Initial Impression / Assessment and Plan / ED Course  I have reviewed the triage vital signs and the nursing notes.  Pertinent labs & imaging results that were available during my care of the patient were reviewed by me and considered in my medical decision making (see chart for details).  Clinical Course    82 y.o. female presents with back pain in lumbar area without signs of radicular pain. No acute traumatic onset. No red flag symptoms of fever, weight loss, saddle anesthesia, weakness, fecal/urinary incontinence or urinary retention.   On review of records patient noted to have compression fracture of T10 previous MRI. We'll obtain plain film of the thoracic and lumbar spine to assess for any acute injuries. Plain films without any acute injuries however did note chronic compression of T4. Also notable for grade 2 anterolisthesis of L5 on S1 which was appreciated on previous imaging.  Patient provided with pain meds and muscle relaxers which resulted in some improvement of her symptoms.  Suspect MSK etiology. No indication for imaging emergently. Patient was recommended to take short course of scheduled Tylenol and engage in  early mobility as definitive treatment. Return precautions discussed for worsening or new concerning symptoms.   Daughters report that the patient will go home with them so that they can provide close care.  Final Clinical Impressions(s) / ED Diagnoses   Final diagnoses:  Muscle spasm of back  Muscle strain   Disposition: Discharge  Condition: Good  I have discussed the results, Dx and Tx plan with the patient who expressed understanding and agree(s) with the plan. Discharge instructions discussed at great length. The patient was given strict return precautions who verbalized understanding of the instructions. No further  questions at time of discharge.    Discharge Medication List as of 06/19/2016 12:44 AM    START taking these medications   Details  acetaminophen (TYLENOL) 500 MG tablet Take 2 tablets (1,000 mg total) by mouth every 8 (eight) hours. Do not take more than 4000 mg of acetaminophen (Tylenol) in a 24-hour period. Please note that other medicines that you may be prescribed may have Tylenol as well., Starting Sun 06/19/2016, U ntil Fri 06/24/2016, Print    cyclobenzaprine (FLEXERIL) 5 MG tablet Take 1 tablet (5 mg total) by mouth 2 (two) times daily as needed for muscle spasms., Starting Sun 06/19/2016, Until Fri 06/24/2016, Print        Follow Up: [facility], [address] [place] [id number] [phone]  Schedule an appointment as soon as possible for a visit  in 5-7 days, If symptoms do not improve or  worsen      Fatima Blank, MD 06/19/16 [phone]

## 2016-06-18 NOTE — ED Notes (Signed)
Bed: WA03 Expected date:  Expected time:  Means of arrival:  Comments: 82yo F back pain

## 2016-06-18 NOTE — ED Triage Notes (Addendum)
Patient complaining of chronic lower back pain. Patient has had surgery on back 3 years ago. About 4 patient had some hydrocodone but only had a little relief. Pain is on the right side radiating from back to the right hip.

## 2016-06-19 MED ORDER — CYCLOBENZAPRINE HCL 5 MG PO TABS: 5.0000 mg | ORAL_TABLET | Freq: Two times a day (BID) | ORAL | 0 refills | Status: DC | PRN

## 2016-06-19 MED ORDER — ACETAMINOPHEN 500 MG PO TABS: 1000.0000 mg | ORAL_TABLET | Freq: Three times a day (TID) | ORAL | 0 refills | Status: AC

## 2016-06-19 MED ORDER — HYDROCODONE-ACETAMINOPHEN 5-325 MG PO TABS: 1.0000 | ORAL_TABLET | Freq: Three times a day (TID) | ORAL | 0 refills | Status: DC | PRN

## 2016-06-20 ENCOUNTER — Observation Stay (HOSPITAL_COMMUNITY)
Admission: EM | Admit: 2016-06-20 | Discharge: 2016-06-21 | Disposition: A | Discharge status: Home / Self Care | Payer: Medicare Other | Attending: Internal Medicine | Admitting: Internal Medicine

## 2016-06-20 ENCOUNTER — Emergency Department (HOSPITAL_COMMUNITY): Payer: Medicare Other

## 2016-06-20 ENCOUNTER — Encounter (HOSPITAL_COMMUNITY): Payer: Self-pay

## 2016-06-20 DIAGNOSIS — E785 Hyperlipidemia, unspecified: Secondary | ICD-10-CM | POA: Diagnosis not present

## 2016-06-20 DIAGNOSIS — Z7982 Long term (current) use of aspirin: Secondary | ICD-10-CM | POA: Diagnosis not present

## 2016-06-20 DIAGNOSIS — N183 Chronic kidney disease, stage 3 (moderate): Secondary | ICD-10-CM

## 2016-06-20 DIAGNOSIS — E7800 Pure hypercholesterolemia, unspecified: Secondary | ICD-10-CM | POA: Diagnosis present

## 2016-06-20 DIAGNOSIS — N189 Chronic kidney disease, unspecified: Secondary | ICD-10-CM | POA: Insufficient documentation

## 2016-06-20 DIAGNOSIS — R262 Difficulty in walking, not elsewhere classified: Secondary | ICD-10-CM

## 2016-06-20 DIAGNOSIS — M549 Dorsalgia, unspecified: Secondary | ICD-10-CM | POA: Diagnosis not present

## 2016-06-20 DIAGNOSIS — I129 Hypertensive chronic kidney disease with stage 1 through stage 4 chronic kidney disease, or unspecified chronic kidney disease: Secondary | ICD-10-CM | POA: Diagnosis not present

## 2016-06-20 DIAGNOSIS — Z79899 Other long term (current) drug therapy: Secondary | ICD-10-CM | POA: Insufficient documentation

## 2016-06-20 DIAGNOSIS — N179 Acute kidney failure, unspecified: Secondary | ICD-10-CM | POA: Diagnosis present

## 2016-06-20 DIAGNOSIS — M8000XA Age-related osteoporosis with current pathological fracture, unspecified site, initial encounter for fracture: Secondary | ICD-10-CM | POA: Diagnosis present

## 2016-06-20 DIAGNOSIS — G8929 Other chronic pain: Secondary | ICD-10-CM | POA: Diagnosis present

## 2016-06-20 DIAGNOSIS — I10 Essential (primary) hypertension: Secondary | ICD-10-CM | POA: Diagnosis not present

## 2016-06-20 LAB — CBC WITH DIFFERENTIAL/PLATELET
Basophils Absolute: 0.1 10*3/uL (ref 0.0–0.1)
Basophils Relative: 1 %
EOS ABS: 0.1 10*3/uL (ref 0.0–0.7)
Eosinophils Relative: 2 %
HCT: 28.5 % — ABNORMAL LOW (ref 36.0–46.0)
HEMOGLOBIN: 10 g/dL — AB (ref 12.0–15.0)
LYMPHS ABS: 2.4 10*3/uL (ref 0.7–4.0)
Lymphocytes Relative: 43 %
MCH: 34.7 pg — AB (ref 26.0–34.0)
MCHC: 35.1 g/dL (ref 30.0–36.0)
MCV: 99 fL (ref 78.0–100.0)
MONOS PCT: 19 %
Monocytes Absolute: 1.1 10*3/uL — ABNORMAL HIGH (ref 0.1–1.0)
NEUTROS PCT: 35 %
Neutro Abs: 2 10*3/uL (ref 1.7–7.7)
Platelets: 179 10*3/uL (ref 150–400)
RBC: 2.88 MIL/uL — ABNORMAL LOW (ref 3.87–5.11)
RDW: 14.4 % (ref 11.5–15.5)
WBC: 5.6 10*3/uL (ref 4.0–10.5)

## 2016-06-20 LAB — I-STAT CHEM 8, ED
BUN: 15 mg/dL (ref 6–20)
CALCIUM ION: 1.33 mmol/L (ref 1.15–1.40)
CHLORIDE: 100 mmol/L — AB (ref 101–111)
Creatinine, Ser: 1 mg/dL (ref 0.44–1.00)
GLUCOSE: 103 mg/dL — AB (ref 65–99)
HCT: 34 % — ABNORMAL LOW (ref 36.0–46.0)
Hemoglobin: 11.6 g/dL — ABNORMAL LOW (ref 12.0–15.0)
Potassium: 3.6 mmol/L (ref 3.5–5.1)
SODIUM: 141 mmol/L (ref 135–145)
TCO2: 30 mmol/L (ref 0–100)

## 2016-06-20 MED ORDER — CYCLOSPORINE 0.05 % OP EMUL
1.0000 [drp] | Freq: Every day | OPHTHALMIC | Status: DC
  Administered 2016-06-20: 1 [drp] via OPHTHALMIC
  Filled 2016-06-20 (×2): qty 1

## 2016-06-20 MED ORDER — KETOROLAC TROMETHAMINE 30 MG/ML IJ SOLN
15.0000 mg | Freq: Once | INTRAMUSCULAR | Status: AC
  Administered 2016-06-20: 15 mg via INTRAVENOUS
  Filled 2016-06-20: qty 1

## 2016-06-20 MED ORDER — EZETIMIBE 10 MG PO TABS
10.0000 mg | ORAL_TABLET | Freq: Every day | ORAL | Status: DC
  Administered 2016-06-21: 10 mg via ORAL
  Filled 2016-06-20: qty 1

## 2016-06-20 MED ORDER — OXYCODONE HCL 5 MG PO TABS
5.0000 mg | ORAL_TABLET | ORAL | Status: DC | PRN
  Administered 2016-06-21: 5 mg via ORAL
  Filled 2016-06-20: qty 1

## 2016-06-20 MED ORDER — ENOXAPARIN SODIUM 40 MG/0.4ML ~~LOC~~ SOLN
40.0000 mg | SUBCUTANEOUS | Status: DC
  Administered 2016-06-20: 40 mg via SUBCUTANEOUS
  Filled 2016-06-20: qty 0.4

## 2016-06-20 MED ORDER — AMLODIPINE BESYLATE 5 MG PO TABS
5.0000 mg | ORAL_TABLET | Freq: Every day | ORAL | Status: DC
  Administered 2016-06-21: 5 mg via ORAL
  Filled 2016-06-20: qty 1

## 2016-06-20 MED ORDER — IBANDRONATE SODIUM 150 MG PO TABS: 150.0000 mg | ORAL_TABLET | ORAL | Status: DC

## 2016-06-20 MED ORDER — CALCITONIN (SALMON) 200 UNIT/ACT NA SOLN
1.0000 | Freq: Every day | NASAL | Status: DC
  Administered 2016-06-21: 1 via NASAL
  Filled 2016-06-20: qty 3.7

## 2016-06-20 MED ORDER — CYCLOBENZAPRINE HCL 5 MG PO TABS: 5.0000 mg | ORAL_TABLET | Freq: Two times a day (BID) | ORAL | Status: DC | PRN

## 2016-06-20 MED ORDER — MORPHINE SULFATE (PF) 2 MG/ML IV SOLN
4.0000 mg | Freq: Once | INTRAVENOUS | Status: AC
  Administered 2016-06-20: 4 mg via INTRAVENOUS
  Filled 2016-06-20: qty 2

## 2016-06-20 MED ORDER — BISACODYL 10 MG RE SUPP: 10.0000 mg | Freq: Every day | RECTAL | Status: DC | PRN

## 2016-06-20 MED ORDER — MAGNESIUM HYDROXIDE 400 MG/5ML PO SUSP: 30.0000 mL | Freq: Every day | ORAL | Status: DC | PRN

## 2016-06-20 MED ORDER — OXYCODONE-ACETAMINOPHEN 5-325 MG PO TABS
2.0000 | ORAL_TABLET | Freq: Once | ORAL | Status: AC
  Administered 2016-06-20: 2 via ORAL
  Filled 2016-06-20: qty 2

## 2016-06-20 MED ORDER — MORPHINE SULFATE (PF) 2 MG/ML IV SOLN
2.0000 mg | INTRAVENOUS | Status: DC | PRN
  Administered 2016-06-20 – 2016-06-21 (×3): 2 mg via INTRAVENOUS
  Filled 2016-06-20 (×3): qty 1

## 2016-06-20 MED ORDER — RISPERIDONE 0.25 MG PO TABS
0.5000 mg | ORAL_TABLET | Freq: Two times a day (BID) | ORAL | Status: DC
  Administered 2016-06-20 – 2016-06-21 (×2): 0.5 mg via ORAL
  Filled 2016-06-20 (×2): qty 2

## 2016-06-20 NOTE — Progress Notes (Signed)
Pt informed CM she did not have DME at home

## 2016-06-20 NOTE — ED Triage Notes (Addendum)
Per EMS, pt from home.  Pt c/o chronic back pain.  Had recent xray which confirmed fracture of unknown site.  Pt states pain started again 3-4 weeks ago. Pt took her vicodin and flexeril this morning at 7am.  Pt given 75 mcg fentanyl in route.  22g in LAC Vitals: 142/89, hr 112, resp 20, 97% ra

## 2016-06-20 NOTE — H&P (Signed)
History and Physical    Kristin [name] GOT:[id number] DOB: Feb 25, 1931 DOA: 06/20/2016  PCP: Evelina Dun, FNP  Patient coming from: Home  Chief Complaint: Back pain, decline in functional status  HPI: Kristin [name] is a 82 y.o. female with medical history significant of HTN, Osteoporosis, Glaucoma, Chronic Back Pain who presents with back pain that has worsened since twisting her back two days ago.  Patient states that her back pain really started a few weeks ago and she has been treated with Norco from her primary care physician since that time.  She was told her pain was likely due to arthritis and osteoporosis.  She says since that time the pain has come and gone and has been reasonably controlled with Norco.  Three days prior to admission she was bending over to put her shoes on when she felt something give out in her back.  She voices that since that time the pain has become excrutiating.  She came into the ED on 11/4, the night that this happened, for evaluation and was sent home with Flexeril and Tylenol.  Patient reports no improvement in her symptoms with these medications and mentions that today she was unable to tolerate the pain any longer; at home pain was not controlled with Norco TID, flexeril BID and intermittent Tylenol.  She was previously independent for ADL's and since three days ago has been weak and barely able to walk.  She also voices since the pain has begun that she has had a harder time controlling her bladder function mostly due to being slower to get up to use the bathroom secondary to pain.  She mentions she has not had a bowel movement in 4 days.  Denies chest pain, chest pressure, shortness of breath.  Does occasionally feel hot but she attributes this to sweating from pain.  Denies dysuria, hematuria, abdominal pain.  Able to move legs and arms freely with pain on flexion at the hip.   ED Course: in the ED patient was seen by EDP and given toradol which did not help  her pain.  Ultimately she was given morphine which helped alleviate her pain and so hospitalist were called to admit for pain control requiring IV pain medication.  Review of Systems: As per HPI otherwise 10 point review of systems negative.    Past Medical History:  Diagnosis Date  . Allergy   . Arthritis   . Chronic kidney disease   . Glaucoma   . Hiatal hernia   . Hypertension   . Obesity   . Osteoporosis    vertebral fracture in 2012  . Vertigo     Past Surgical History:  Procedure Laterality Date  . ABDOMINAL HYSTERECTOMY     partial  . vertebralplasty  2012     reports that she has never smoked. She has never used smokeless tobacco. She reports that she does not drink alcohol or use drugs.  Allergies  Allergen Reactions  . Alendronate Nausea Only and Other (See Comments)    Hip pain  . Penicillins Rash    Has patient had a PCN reaction causing immediate rash, facial/tongue/throat swelling, SOB or lightheadedness with hypotension: no Has patient had a PCN reaction causing severe rash involving mucus membranes or skin necrosis: no Has patient had a PCN reaction that required hospitalization no Has patient had a PCN reaction occurring within the last 10 years:no If all of the above answers are "NO", then may proceed with Cephalosporin use.    Marland Kitchen  Sulfonamide Derivatives Itching and Rash    REACTION: itching, rash    Family History  Problem Relation Age of Onset  . Hypertension Mother   . Osteoporosis Mother   . CVA Mother   . Cancer Neg Hx      Prior to Admission medications   Medication Sig Start Date End Date Taking? Authorizing Provider  acetaminophen (TYLENOL) 500 MG tablet Take 2 tablets (1,000 mg total) by mouth every 8 (eight) hours. Do not take more than 4000 mg of acetaminophen (Tylenol) in a 24-hour period. Please note that other medicines that you may be prescribed may have Tylenol as well. 06/19/16 06/24/16 Yes Fatima Blank, MD  amLODipine  (NORVASC) 5 MG tablet Take 1 tablet (5 mg total) by mouth daily. 02/19/14  Yes Cherre Robins, PharmD  aspirin 81 MG tablet Take 81 mg by mouth daily.   Yes Historical Provider, MD  Cholecalciferol (VITAMIN D PO) Take 1 tablet by mouth every morning.   Yes Historical Provider, MD  cyclobenzaprine (FLEXERIL) 5 MG tablet Take 1 tablet (5 mg total) by mouth 2 (two) times daily as needed for muscle spasms. 06/19/16 06/24/16 Yes Pedro Aretha Parrot, MD  ezetimibe (ZETIA) 10 MG tablet Take 1 tablet (10 mg total) by mouth daily. 04/05/13  Yes [name], MD  HYDROcodone-acetaminophen (NORCO/VICODIN) 5-325 MG tablet Take 1 tablet by mouth every 8 (eight) hours as needed for severe pain (That is not improved by your scheduled acetaminophen regimen). Please do not exceed 4000 mg of acetaminophen (Tylenol) a 24-hour period. Please note that he may be prescribed additional medicine that contains acetaminophen. 06/19/16 06/22/16 Yes Pedro Aretha Parrot, MD  meclizine (ANTIVERT) 25 MG tablet TAKE ONE TABLET BY MOUTH THREE TIMES DAILY AS NEEDED Patient taking differently: Take 25 mg by mouth 3 (three) times daily as needed for dizziness. TAKE ONE TABLET BY MOUTH THREE TIMES DAILY AS NEEDED 06/20/14  Yes Chipper Herb, MD  Multiple Vitamins-Minerals (CENTRUM SILVER PO) Take 1 tablet by mouth daily.    Yes Historical Provider, MD  RESTASIS 0.05 % ophthalmic emulsion Place 1 drop into both eyes daily.  01/31/13  Yes Historical Provider, MD  risperiDONE (RISPERDAL) 0.5 MG tablet Take 1 tablet (0.5 mg total) by mouth daily. Patient taking differently: Take 0.5 mg by mouth 2 (two) times daily.  07/09/13  Yes [name], MD  ibandronate (BONIVA) 150 MG tablet Take 1 tablet (150 mg total) by mouth every 30 (thirty) days. Take in the morning with a full glass of water, on an empty stomach, and do not take anything else by mouth or lie down for the next 30 min. 02/19/14   Cherre Robins, PharmD  valsartan-hydrochlorothiazide  (DIOVAN-HCT) 80-12.5 MG per tablet Take 1 tablet by mouth daily. Patient not taking: Reported on 06/20/2016 04/05/13   [name], MD    Physical Exam: Vitals:   06/20/16 1147 06/20/16 1355 06/20/16 1542  BP: 144/83 143/88 143/81  Pulse: 103 100 93  Resp: 12 16 17   Temp: 98.1 F (36.7 C)    SpO2: 93% 99% 96%      Constitutional: NAD, calm, comfortable Vitals:   06/20/16 1147 06/20/16 1355 06/20/16 1542  BP: 144/83 143/88 143/81  Pulse: 103 100 93  Resp: 12 16 17   Temp: 98.1 F (36.7 C)    SpO2: 93% 99% 96%   Eyes: PERRL, lids and conjunctivae normal, arcus senilus present ENMT: Mucous membranes are moist. Posterior pharynx clear of any exudate or  lesions.Normal dentition.  Neck: normal, supple, no masses, no thyromegaly Respiratory: clear to auscultation bilaterally, no wheezing, no crackles. Normal respiratory effort. No accessory muscle use.  Cardiovascular: Regular rate and rhythm, no murmurs / rubs / gallops. Prominent S2. No extremity edema. 2+ pedal pulses. No carotid bruits.  Abdomen: no tenderness, no masses palpated. No hepatosplenomegaly. Bowel sounds positive.  Musculoskeletal: no clubbing / cyanosis. No joint deformity upper and lower extremities. Good ROM, no contractures. Normal muscle tone. Pain with palpation of middle and lower back on the left side and outer hip on the right side Skin: no rashes, lesions, ulcers. No induration Neurologic: CN 2-12 grossly intact. Sensation intact, DTR normal. Strength 5/5 in all 4.  Psychiatric: Normal judgment and insight. Alert and oriented x 3. Normal mood.     Labs on Admission: I have personally reviewed following labs and imaging studies  CBC:  Recent Labs Lab 06/20/16 1518 06/20/16 1734  WBC  --  5.6  NEUTROABS  --  2.0  HGB 11.6* 10.0*  HCT 34.0* 28.5*  MCV  --  99.0  PLT  --  426   Basic Metabolic Panel:  Recent Labs Lab 06/20/16 1518  NA 141  K 3.6  CL 100*  GLUCOSE 103*  BUN 15  CREATININE  1.00   GFR: Estimated Creatinine Clearance: 37.7 mL/min (by C-G formula based on SCr of 1 mg/dL). Liver Function Tests: No results for input(s): AST, ALT, ALKPHOS, BILITOT, PROT, ALBUMIN in the last 168 hours. No results for input(s): LIPASE, AMYLASE in the last 168 hours. No results for input(s): AMMONIA in the last 168 hours. Coagulation Profile: No results for input(s): INR, PROTIME in the last 168 hours. Cardiac Enzymes: No results for input(s): CKTOTAL, CKMB, CKMBINDEX, TROPONINI in the last 168 hours. BNP (last 3 results) No results for input(s): PROBNP in the last 8760 hours. HbA1C: No results for input(s): HGBA1C in the last 72 hours. CBG: No results for input(s): GLUCAP in the last 168 hours. Lipid Profile: No results for input(s): CHOL, HDL, LDLCALC, TRIG, CHOLHDL, LDLDIRECT in the last 72 hours. Thyroid Function Tests: No results for input(s): TSH, T4TOTAL, FREET4, T3FREE, THYROIDAB in the last 72 hours. Anemia Panel: No results for input(s): VITAMINB12, FOLATE, FERRITIN, TIBC, IRON, RETICCTPCT in the last 72 hours. Urine analysis:    Component Value Date/Time   COLORURINE YELLOW 10/18/2007 0945   APPEARANCEUR CLEAR 10/18/2007 0945   LABSPEC 1.012 10/18/2007 0945   PHURINE 7.0 10/18/2007 0945   GLUCOSEU NEGATIVE 10/18/2007 0945   HGBUR NEGATIVE 10/18/2007 0945   BILIRUBINUR NEGATIVE 10/18/2007 0945   KETONESUR NEGATIVE 10/18/2007 0945   PROTEINUR NEGATIVE 10/18/2007 0945   UROBILINOGEN 0.2 10/18/2007 0945   NITRITE NEGATIVE 10/18/2007 0945   LEUKOCYTESUR  10/18/2007 0945    NEGATIVE MICROSCOPIC NOT DONE ON URINES WITH NEGATIVE PROTEIN, BLOOD, LEUKOCYTES, NITRITE, OR GLUCOSE <1000 mg/dL.   Sepsis Labs: !!!!!!!!!!!!!!!!!!!!!!!!!!!!!!!!!!!!!!!!!!!! @LABRCNTIP (procalcitonin:4,lacticidven:4) )No results found for this or any previous visit (from the past 240 hour(s)).   Radiological Exams on Admission: Dg Thoracic Spine 2 View  Addendum Date: 06/19/2016     ADDENDUM REPORT: 06/19/2016 00:01 ADDENDUM: On further evaluation, note that the patient's prior vertebroplasty is at T10, and the new chronic compression deformity is at T5. Electronically Signed   By: Garald Balding M.D.   On: 06/19/2016 00:01   Result Date: 06/19/2016 CLINICAL DATA:  Chronic right mid back pain.  Initial encounter. EXAM: THORACIC SPINE 2 VIEWS COMPARISON:  Thoracic radiographs performed 04/25/2016 FINDINGS: There  is a chronic compression deformity at T4, new from September. Vertebroplasty changes are again noted at T9. Mild degenerative change is noted at the upper lumbar spine. Clips are noted within the right upper quadrant, reflecting prior cholecystectomy. IMPRESSION: 1. Chronic compression deformity of T4 is new from September. 2. Changes of vertebroplasty again noted at T9, with mild degenerative change at the upper lumbar spine. Electronically Signed: By: Garald Balding M.D. On: 06/18/2016 23:55   Dg Lumbar Spine Complete  Result Date: 06/19/2016 CLINICAL DATA:  Chronic lower back pain, worse on the right. Initial encounter. EXAM: LUMBAR SPINE - COMPLETE 4+ VIEW COMPARISON:  MRI of the lumbar spine performed 04/14/2011 FINDINGS: There is no evidence of acute fracture or subluxation. Scattered lateral osteophytes are noted along the lower thoracic and lumbar spine. Endplate sclerotic change is noted at L1-L2, with associated vacuum phenomenon. There is grade 2 anterolisthesis of L5 on S1, reflecting underlying facet disease. The patient is status post vertebroplasty at T10. The visualized bowel gas pattern is unremarkable in appearance; air and stool are noted within the colon. The sacroiliac joints are within normal limits. The liver is unremarkable in appearance. The patient is status post cholecystectomy, with clips noted at the gallbladder fossa. The common bile duct remains normal in caliber. IMPRESSION: 1. No evidence of acute fracture or subluxation along the lumbar spine. 2.  Grade 2 anterolisthesis of L5 on S1, reflecting underlying facet disease. Electronically Signed   By: Garald Balding M.D.   On: 06/18/2016 23:59   Ct Thoracic Spine Wo Contrast  Result Date: 06/20/2016 CLINICAL DATA:  Severe back pain. Increasing back pain 3-4 weeks ago. Abnormal x-ray. EXAM: CT THORACIC AND LUMBAR SPINE WITHOUT CONTRAST TECHNIQUE: Multidetector CT imaging of the thoracic and lumbar spine was performed without contrast. Multiplanar CT image reconstructions were also generated. COMPARISON:  Thoracic spine radiographs 06/18/2016 and 911/17. FINDINGS: CT THORACIC SPINE FINDINGS Alignment: AP alignment is anatomic. There is exaggerated thoracic kyphosis centered at T4 and T5. Vertebrae: The superior endplate compression fracture is present at T4 with compressed bone suggesting a subacute fracture there is nearly 50% loss of height compared to the T3 vertebral body. There is no significant retropulsion of bone. Previous vertebral augmentation at T9 is noted. Chronic endplate changes are seen anteriorly at T5-6 and to lesser extent T6-7. Vertebral body heights and marrow density are otherwise normal. Paraspinal and other soft tissues: Atherosclerotic changes are present at the aortic arch and descending aorta without aneurysm. A moderate-sized hiatal hernia is present. Coronary artery calcifications are present. A low-density lesion anteriorly in the left kidney likely represents a simple cyst. Dependent atelectasis is worse on the left. Disc levels: Facet spurring results in moderate right foraminal stenosis at T4-5. Asymmetric degenerative changes noted the costovertebral junction on the right at T9 and T10. This may be secondary to remote trauma. CT LUMBAR SPINE FINDINGS Segmentation: 5 non rib-bearing lumbar type vertebral bodies are present. Alignment: 9 mm grade 2 anterolisthesis is present at L5-S1. There is slight retrolisthesis at L2-3. AP alignment is otherwise anatomic. Rightward curvature  of the lumbar spine is centered at L2. Vertebrae: Vertebral body heights are maintained. Is asymmetric endplate change on the right at L4-5 and L5-S1 and on the left at L1-2 L2-3. Paraspinal and other soft tissues: No atherosclerotic calcifications are present in the aorta and branch vessels. 2 low-density lesions are present within the left kidney. A 4 cm anterior and upper pole lesion is present. A smaller posterolateral and lower  pole cyst is present. There is no significant adenopathy. There is some atrophy of the paraspinous musculature. Disc levels: L1-2: Asymmetric left-sided facet hypertrophy contributes to mild left foraminal narrowing. The central canal is patent. L2-3: A mild broad-based disc protrusion is present. Asymmetric endplate spurring on the left contributes to moderate left foraminal stenosis. L3-4: A broad-based disc protrusion is present. Mild foraminal narrowing is present bilaterally. L4-5: A broad-based disc protrusion is asymmetric on the right. Facet hypertrophy is worse on the left. This results an mild subarticular and foraminal narrowing bilaterally. L5-S1: There is uncovering of a broad-based disc protrusion. Advanced facet hypertrophy is noted bilaterally. The combination results in severe central and bilateral foraminal stenosis, worse on the right. IMPRESSION: 1. T4 superior endplate compression fracture is likely subacute, new since 04/25/2016. There is no significant retropulsion of bone. Compressed marrow is evident. 2. Moderate right foraminal stenosis at T4-5 secondary to facet spurring. 3. Posttraumatic changes on the right at T9 and T10. 4. Extensive atherosclerotic disease including coronary artery disease. 5. Left greater than right dependent atelectasis. 6. Left renal cysts. 7. Multilevel spondylosis of the lumbar spine is most severe at L5-S1 with there is severe central and bilateral foraminal stenosis, worse on the right. 8. Rightward curvature of the lumbar spine is  centered at L2 with asymmetric leftward curvature at L5-S1. Electronically Signed   By: San Morelle M.D.   On: 06/20/2016 16:00   Ct Lumbar Spine Wo Contrast  Result Date: 06/20/2016 CLINICAL DATA:  Severe back pain. Increasing back pain 3-4 weeks ago. Abnormal x-ray. EXAM: CT THORACIC AND LUMBAR SPINE WITHOUT CONTRAST TECHNIQUE: Multidetector CT imaging of the thoracic and lumbar spine was performed without contrast. Multiplanar CT image reconstructions were also generated. COMPARISON:  Thoracic spine radiographs 06/18/2016 and 911/17. FINDINGS: CT THORACIC SPINE FINDINGS Alignment: AP alignment is anatomic. There is exaggerated thoracic kyphosis centered at T4 and T5. Vertebrae: The superior endplate compression fracture is present at T4 with compressed bone suggesting a subacute fracture there is nearly 50% loss of height compared to the T3 vertebral body. There is no significant retropulsion of bone. Previous vertebral augmentation at T9 is noted. Chronic endplate changes are seen anteriorly at T5-6 and to lesser extent T6-7. Vertebral body heights and marrow density are otherwise normal. Paraspinal and other soft tissues: Atherosclerotic changes are present at the aortic arch and descending aorta without aneurysm. A moderate-sized hiatal hernia is present. Coronary artery calcifications are present. A low-density lesion anteriorly in the left kidney likely represents a simple cyst. Dependent atelectasis is worse on the left. Disc levels: Facet spurring results in moderate right foraminal stenosis at T4-5. Asymmetric degenerative changes noted the costovertebral junction on the right at T9 and T10. This may be secondary to remote trauma. CT LUMBAR SPINE FINDINGS Segmentation: 5 non rib-bearing lumbar type vertebral bodies are present. Alignment: 9 mm grade 2 anterolisthesis is present at L5-S1. There is slight retrolisthesis at L2-3. AP alignment is otherwise anatomic. Rightward curvature of the  lumbar spine is centered at L2. Vertebrae: Vertebral body heights are maintained. Is asymmetric endplate change on the right at L4-5 and L5-S1 and on the left at L1-2 L2-3. Paraspinal and other soft tissues: No atherosclerotic calcifications are present in the aorta and branch vessels. 2 low-density lesions are present within the left kidney. A 4 cm anterior and upper pole lesion is present. A smaller posterolateral and lower pole cyst is present. There is no significant adenopathy. There is some atrophy of the paraspinous  musculature. Disc levels: L1-2: Asymmetric left-sided facet hypertrophy contributes to mild left foraminal narrowing. The central canal is patent. L2-3: A mild broad-based disc protrusion is present. Asymmetric endplate spurring on the left contributes to moderate left foraminal stenosis. L3-4: A broad-based disc protrusion is present. Mild foraminal narrowing is present bilaterally. L4-5: A broad-based disc protrusion is asymmetric on the right. Facet hypertrophy is worse on the left. This results an mild subarticular and foraminal narrowing bilaterally. L5-S1: There is uncovering of a broad-based disc protrusion. Advanced facet hypertrophy is noted bilaterally. The combination results in severe central and bilateral foraminal stenosis, worse on the right. IMPRESSION: 1. T4 superior endplate compression fracture is likely subacute, new since 04/25/2016. There is no significant retropulsion of bone. Compressed marrow is evident. 2. Moderate right foraminal stenosis at T4-5 secondary to facet spurring. 3. Posttraumatic changes on the right at T9 and T10. 4. Extensive atherosclerotic disease including coronary artery disease. 5. Left greater than right dependent atelectasis. 6. Left renal cysts. 7. Multilevel spondylosis of the lumbar spine is most severe at L5-S1 with there is severe central and bilateral foraminal stenosis, worse on the right. 8. Rightward curvature of the lumbar spine is centered  at L2 with asymmetric leftward curvature at L5-S1. Electronically Signed   By: San Morelle M.D.   On: 06/20/2016 16:00    EKG: not performed  Assessment/Plan Active Problems:   CKD (chronic kidney disease)   Osteoporosis with pathological fracture   HLD (hyperlipidemia)   HTN (hypertension)   Chronic back pain   Intractable back pain   Back pain     Intractable Back Pain likely due to osteoporosis - gave morphine 34m in the ED - written for morphine [id number] PRN pain  - written for oxycodone [id number] PRN pain - bowel regimen of Miralax, milk of magnesia and suppository written for - K pad ordered - did not continue flexeril as patient denied this helped - can consider additional medication such a gabapentin as appropriate - will add calcitonin [id number] daily alternating nostrils - patient supposed to start Boniva today- can restart outpatient  HLD - continue Zetia  HTN - continue home dose of amlodipine  DVT prophylaxis: Lovenox and SCDs Code Status:  Full Code Family Communication:  Daughter is bedside Disposition Plan: Back to home environment when pain controlled Consults called: None Admission status: Med Surg Observation   [facility] Pager [phone] 7PM-7AM, please contact night-coverage [email or web address] Password [email or web address]  06/20/2016, 5:58 PM

## 2016-06-20 NOTE — ED Notes (Signed)
Food tray ordered

## 2016-06-20 NOTE — Progress Notes (Signed)
ED CM spoke with EDP Little who preference is to have pt admitted Pt and family prefers to have pt monitored for d/c needs after seen by PT/OT on unit before making final DME choice  CM updated J[facility] of [facility] of pt needing to be followed for d/c needs

## 2016-06-20 NOTE — ED Notes (Signed)
ED Provider at bedside. 

## 2016-06-20 NOTE — ED Notes (Signed)
Attempted to call report. Nurse on phone with physician, will call back.

## 2016-06-20 NOTE — ED Notes (Signed)
Attempted to ambulate pt. with 2 person assist. Pt states she does not use any assistive devices at home.  Pt was able to stand with great dependence on assist, however states she could not walk because it caused too much pain. Pt took 3 steps then returned back to bed. Pt pain 8/10 at this time.

## 2016-06-20 NOTE — ED Provider Notes (Signed)
[facility] DEPT Provider Note   CSN: [id number] Arrival date & time: 06/20/16  1131     History   Chief Complaint Chief Complaint  Patient presents with  . Back Pain    HPI Kristin [name] is a 82 y.o. female.  82yo F w/ PMH incluidng HTN, osteoporosis who p/w back pain. The patient presented here to the ED 2 days ago for 1 week of back pain worst in her right back and radiating down her right hip but involving her entire back. It initially began when she bent over to put her shoes on and it has been persistent and severe. She was noted to have chronic compression deformities on XR. Family reports that she left still in pain and has remained in severe pain despite taking Norco 1 tablet TID and flexeril BID with occasional dose of 545m tylenol. Daughters state that she has difficulty performing ADLs including getting to restroom due to severe pain. She had some numbness of her feet earlier today but denies any currently. No saddle anesthesia or bowel/bladder incontinence. No fevers or recent illness.   The history is provided by the patient and a relative.  Back Pain      Past Medical History:  Diagnosis Date  . Allergy   . Glaucoma   . Hiatal hernia   . Hypertension   . Obesity   . Osteoporosis    vertebral fracture in 2012  . Vertigo     Patient Active Problem List   Diagnosis Date Noted  . Other malaise and fatigue 11/26/2013  . Vertigo   . Obesity   . Arthritis of knee, left 07/09/2013  . Need for prophylactic vaccination and inoculation against influenza 07/09/2013  . CKD (chronic kidney disease) 01/01/2013  . Osteoporosis with pathological fracture 01/01/2013  . HLD (hyperlipidemia) 01/01/2013  . HTN (hypertension) 01/01/2013  . Chronic back pain 01/01/2013  . Vertebral compression fracture (HIonia 01/01/2013  . Seasonal allergic rhinitis 01/01/2013    Past Surgical History:  Procedure Laterality Date  . ABDOMINAL HYSTERECTOMY     partial  .  vertebralplasty  2012    OB History    No data available       Home Medications    Prior to Admission medications   Medication Sig Start Date End Date Taking? Authorizing Provider  acetaminophen (TYLENOL) 500 MG tablet Take 2 tablets (1,000 mg total) by mouth every 8 (eight) hours. Do not take more than 4000 mg of acetaminophen (Tylenol) in a 24-hour period. Please note that other medicines that you may be prescribed may have Tylenol as well. 06/19/16 06/24/16 Yes PFatima Blank MD  amLODipine (NORVASC) 5 MG tablet Take 1 tablet (5 mg total) by mouth daily. 02/19/14  Yes TCherre Robins PharmD  aspirin 81 MG tablet Take 81 mg by mouth daily.   Yes Historical Provider, MD  Cholecalciferol (VITAMIN D PO) Take 1 tablet by mouth every morning.   Yes Historical Provider, MD  cyclobenzaprine (FLEXERIL) 5 MG tablet Take 1 tablet (5 mg total) by mouth 2 (two) times daily as needed for muscle spasms. 06/19/16 06/24/16 Yes Pedro EAretha Parrot MD  ezetimibe (ZETIA) 10 MG tablet Take 1 tablet (10 mg total) by mouth daily. 04/05/13  Yes FVernie Shanks MD  HYDROcodone-acetaminophen (NORCO/VICODIN) 5-325 MG tablet Take 1 tablet by mouth every 8 (eight) hours as needed for severe pain (That is not improved by your scheduled acetaminophen regimen). Please do not exceed 4000 mg of acetaminophen (Tylenol) a  24-hour period. Please note that he may be prescribed additional medicine that contains acetaminophen. 06/19/16 06/22/16 Yes Pedro Aretha Parrot, MD  meclizine (ANTIVERT) 25 MG tablet TAKE ONE TABLET BY MOUTH THREE TIMES DAILY AS NEEDED Patient taking differently: Take 25 mg by mouth 3 (three) times daily as needed for dizziness. TAKE ONE TABLET BY MOUTH THREE TIMES DAILY AS NEEDED 06/20/14  Yes Chipper Herb, MD  Multiple Vitamins-Minerals (CENTRUM SILVER PO) Take 1 tablet by mouth daily.    Yes Historical Provider, MD  RESTASIS 0.05 % ophthalmic emulsion Place 1 drop into both eyes daily.  01/31/13  Yes  Historical Provider, MD  risperiDONE (RISPERDAL) 0.5 MG tablet Take 1 tablet (0.5 mg total) by mouth daily. Patient taking differently: Take 0.5 mg by mouth 2 (two) times daily.  07/09/13  Yes [name], MD  ibandronate (BONIVA) 150 MG tablet Take 1 tablet (150 mg total) by mouth every 30 (thirty) days. Take in the morning with a full glass of water, on an empty stomach, and do not take anything else by mouth or lie down for the next 30 min. 02/19/14   Cherre Robins, PharmD  valsartan-hydrochlorothiazide (DIOVAN-HCT) 80-12.5 MG per tablet Take 1 tablet by mouth daily. Patient not taking: Reported on 06/20/2016 04/05/13   [name], MD    Family History Family History  Problem Relation Age of Onset  . Hypertension Mother   . Osteoporosis Mother     Social History Social History  Substance Use Topics  . Smoking status: Never Smoker  . Smokeless tobacco: Never Used  . Alcohol use No     Allergies   Alendronate; Penicillins; and Sulfonamide derivatives   Review of Systems Review of Systems  Musculoskeletal: Positive for back pain.   10 Systems reviewed and are negative for acute change except as noted in the HPI.   Physical Exam Updated Vital Signs BP 143/81 (BP Location: Right Arm)   Pulse 93   Temp 98.1 F (36.7 C)   Resp 17   SpO2 96%   Physical Exam  Constitutional: She is oriented to person, place, and time. She appears well-developed and well-nourished. No distress.  HENT:  Head: Normocephalic and atraumatic.  Moist mucous membranes  Eyes: Conjunctivae are normal.  Neck: Neck supple.  Cardiovascular: Normal rate, regular rhythm and normal heart sounds.   No murmur heard. Pulmonary/Chest: Effort normal and breath sounds normal.  Abdominal: Soft. Bowel sounds are normal. She exhibits no distension. There is no tenderness.  Musculoskeletal: She exhibits no edema.  Neurological: She is alert and oriented to person, place, and time. No sensory deficit.    Fluent speech Normal flexion/extension strength of feet  Skin: Skin is warm and dry.  Psychiatric: She has a normal mood and affect. Judgment normal.  Nursing note and vitals reviewed.    ED Treatments / Results  Labs (all labs ordered are listed, but only abnormal results are displayed) Labs Reviewed  I-STAT CHEM 8, ED - Abnormal; Notable for the following:       Result Value   Chloride 100 (*)    Glucose, Bld 103 (*)    Hemoglobin 11.6 (*)    HCT 34.0 (*)    All other components within normal limits  CBC WITH DIFFERENTIAL/PLATELET    EKG  EKG Interpretation None       Radiology Dg Thoracic Spine 2 View  Addendum Date: 06/19/2016   ADDENDUM REPORT: 06/19/2016 00:01 ADDENDUM: On further evaluation, note that the patient's  prior vertebroplasty is at T10, and the new chronic compression deformity is at T5. Electronically Signed   By: Garald Balding M.D.   On: 06/19/2016 00:01   Result Date: 06/19/2016 CLINICAL DATA:  Chronic right mid back pain.  Initial encounter. EXAM: THORACIC SPINE 2 VIEWS COMPARISON:  Thoracic radiographs performed 04/25/2016 FINDINGS: There is a chronic compression deformity at T4, new from September. Vertebroplasty changes are again noted at T9. Mild degenerative change is noted at the upper lumbar spine. Clips are noted within the right upper quadrant, reflecting prior cholecystectomy. IMPRESSION: 1. Chronic compression deformity of T4 is new from September. 2. Changes of vertebroplasty again noted at T9, with mild degenerative change at the upper lumbar spine. Electronically Signed: By: Garald Balding M.D. On: 06/18/2016 23:55   Dg Lumbar Spine Complete  Result Date: 06/19/2016 CLINICAL DATA:  Chronic lower back pain, worse on the right. Initial encounter. EXAM: LUMBAR SPINE - COMPLETE 4+ VIEW COMPARISON:  MRI of the lumbar spine performed 04/14/2011 FINDINGS: There is no evidence of acute fracture or subluxation. Scattered lateral osteophytes are noted  along the lower thoracic and lumbar spine. Endplate sclerotic change is noted at L1-L2, with associated vacuum phenomenon. There is grade 2 anterolisthesis of L5 on S1, reflecting underlying facet disease. The patient is status post vertebroplasty at T10. The visualized bowel gas pattern is unremarkable in appearance; air and stool are noted within the colon. The sacroiliac joints are within normal limits. The liver is unremarkable in appearance. The patient is status post cholecystectomy, with clips noted at the gallbladder fossa. The common bile duct remains normal in caliber. IMPRESSION: 1. No evidence of acute fracture or subluxation along the lumbar spine. 2. Grade 2 anterolisthesis of L5 on S1, reflecting underlying facet disease. Electronically Signed   By: Garald Balding M.D.   On: 06/18/2016 23:59   Ct Thoracic Spine Wo Contrast  Result Date: 06/20/2016 CLINICAL DATA:  Severe back pain. Increasing back pain 3-4 weeks ago. Abnormal x-ray. EXAM: CT THORACIC AND LUMBAR SPINE WITHOUT CONTRAST TECHNIQUE: Multidetector CT imaging of the thoracic and lumbar spine was performed without contrast. Multiplanar CT image reconstructions were also generated. COMPARISON:  Thoracic spine radiographs 06/18/2016 and 911/17. FINDINGS: CT THORACIC SPINE FINDINGS Alignment: AP alignment is anatomic. There is exaggerated thoracic kyphosis centered at T4 and T5. Vertebrae: The superior endplate compression fracture is present at T4 with compressed bone suggesting a subacute fracture there is nearly 50% loss of height compared to the T3 vertebral body. There is no significant retropulsion of bone. Previous vertebral augmentation at T9 is noted. Chronic endplate changes are seen anteriorly at T5-6 and to lesser extent T6-7. Vertebral body heights and marrow density are otherwise normal. Paraspinal and other soft tissues: Atherosclerotic changes are present at the aortic arch and descending aorta without aneurysm. A  moderate-sized hiatal hernia is present. Coronary artery calcifications are present. A low-density lesion anteriorly in the left kidney likely represents a simple cyst. Dependent atelectasis is worse on the left. Disc levels: Facet spurring results in moderate right foraminal stenosis at T4-5. Asymmetric degenerative changes noted the costovertebral junction on the right at T9 and T10. This may be secondary to remote trauma. CT LUMBAR SPINE FINDINGS Segmentation: 5 non rib-bearing lumbar type vertebral bodies are present. Alignment: 9 mm grade 2 anterolisthesis is present at L5-S1. There is slight retrolisthesis at L2-3. AP alignment is otherwise anatomic. Rightward curvature of the lumbar spine is centered at L2. Vertebrae: Vertebral body heights are maintained. Is  asymmetric endplate change on the right at L4-5 and L5-S1 and on the left at L1-2 L2-3. Paraspinal and other soft tissues: No atherosclerotic calcifications are present in the aorta and branch vessels. 2 low-density lesions are present within the left kidney. A 4 cm anterior and upper pole lesion is present. A smaller posterolateral and lower pole cyst is present. There is no significant adenopathy. There is some atrophy of the paraspinous musculature. Disc levels: L1-2: Asymmetric left-sided facet hypertrophy contributes to mild left foraminal narrowing. The central canal is patent. L2-3: A mild broad-based disc protrusion is present. Asymmetric endplate spurring on the left contributes to moderate left foraminal stenosis. L3-4: A broad-based disc protrusion is present. Mild foraminal narrowing is present bilaterally. L4-5: A broad-based disc protrusion is asymmetric on the right. Facet hypertrophy is worse on the left. This results an mild subarticular and foraminal narrowing bilaterally. L5-S1: There is uncovering of a broad-based disc protrusion. Advanced facet hypertrophy is noted bilaterally. The combination results in severe central and bilateral  foraminal stenosis, worse on the right. IMPRESSION: 1. T4 superior endplate compression fracture is likely subacute, new since 04/25/2016. There is no significant retropulsion of bone. Compressed marrow is evident. 2. Moderate right foraminal stenosis at T4-5 secondary to facet spurring. 3. Posttraumatic changes on the right at T9 and T10. 4. Extensive atherosclerotic disease including coronary artery disease. 5. Left greater than right dependent atelectasis. 6. Left renal cysts. 7. Multilevel spondylosis of the lumbar spine is most severe at L5-S1 with there is severe central and bilateral foraminal stenosis, worse on the right. 8. Rightward curvature of the lumbar spine is centered at L2 with asymmetric leftward curvature at L5-S1. Electronically Signed   By: San Morelle M.D.   On: 06/20/2016 16:00   Ct Lumbar Spine Wo Contrast  Result Date: 06/20/2016 CLINICAL DATA:  Severe back pain. Increasing back pain 3-4 weeks ago. Abnormal x-ray. EXAM: CT THORACIC AND LUMBAR SPINE WITHOUT CONTRAST TECHNIQUE: Multidetector CT imaging of the thoracic and lumbar spine was performed without contrast. Multiplanar CT image reconstructions were also generated. COMPARISON:  Thoracic spine radiographs 06/18/2016 and 911/17. FINDINGS: CT THORACIC SPINE FINDINGS Alignment: AP alignment is anatomic. There is exaggerated thoracic kyphosis centered at T4 and T5. Vertebrae: The superior endplate compression fracture is present at T4 with compressed bone suggesting a subacute fracture there is nearly 50% loss of height compared to the T3 vertebral body. There is no significant retropulsion of bone. Previous vertebral augmentation at T9 is noted. Chronic endplate changes are seen anteriorly at T5-6 and to lesser extent T6-7. Vertebral body heights and marrow density are otherwise normal. Paraspinal and other soft tissues: Atherosclerotic changes are present at the aortic arch and descending aorta without aneurysm. A  moderate-sized hiatal hernia is present. Coronary artery calcifications are present. A low-density lesion anteriorly in the left kidney likely represents a simple cyst. Dependent atelectasis is worse on the left. Disc levels: Facet spurring results in moderate right foraminal stenosis at T4-5. Asymmetric degenerative changes noted the costovertebral junction on the right at T9 and T10. This may be secondary to remote trauma. CT LUMBAR SPINE FINDINGS Segmentation: 5 non rib-bearing lumbar type vertebral bodies are present. Alignment: 9 mm grade 2 anterolisthesis is present at L5-S1. There is slight retrolisthesis at L2-3. AP alignment is otherwise anatomic. Rightward curvature of the lumbar spine is centered at L2. Vertebrae: Vertebral body heights are maintained. Is asymmetric endplate change on the right at L4-5 and L5-S1 and on the left at L1-2  L2-3. Paraspinal and other soft tissues: No atherosclerotic calcifications are present in the aorta and branch vessels. 2 low-density lesions are present within the left kidney. A 4 cm anterior and upper pole lesion is present. A smaller posterolateral and lower pole cyst is present. There is no significant adenopathy. There is some atrophy of the paraspinous musculature. Disc levels: L1-2: Asymmetric left-sided facet hypertrophy contributes to mild left foraminal narrowing. The central canal is patent. L2-3: A mild broad-based disc protrusion is present. Asymmetric endplate spurring on the left contributes to moderate left foraminal stenosis. L3-4: A broad-based disc protrusion is present. Mild foraminal narrowing is present bilaterally. L4-5: A broad-based disc protrusion is asymmetric on the right. Facet hypertrophy is worse on the left. This results an mild subarticular and foraminal narrowing bilaterally. L5-S1: There is uncovering of a broad-based disc protrusion. Advanced facet hypertrophy is noted bilaterally. The combination results in severe central and bilateral  foraminal stenosis, worse on the right. IMPRESSION: 1. T4 superior endplate compression fracture is likely subacute, new since 04/25/2016. There is no significant retropulsion of bone. Compressed marrow is evident. 2. Moderate right foraminal stenosis at T4-5 secondary to facet spurring. 3. Posttraumatic changes on the right at T9 and T10. 4. Extensive atherosclerotic disease including coronary artery disease. 5. Left greater than right dependent atelectasis. 6. Left renal cysts. 7. Multilevel spondylosis of the lumbar spine is most severe at L5-S1 with there is severe central and bilateral foraminal stenosis, worse on the right. 8. Rightward curvature of the lumbar spine is centered at L2 with asymmetric leftward curvature at L5-S1. Electronically Signed   By: San Morelle M.D.   On: 06/20/2016 16:00    Procedures Procedures (including critical care time)  Medications Ordered in ED Medications  oxyCODONE-acetaminophen (PERCOCET/ROXICET) 5-325 MG per tablet 2 tablet (2 tablets Oral Given 06/20/16 1501)  ketorolac (TORADOL) 30 MG/ML injection 15 mg (15 mg Intravenous Given 06/20/16 1548)  morphine 2 MG/ML injection 4 mg (4 mg Intravenous Given 06/20/16 1650)     Initial Impression / Assessment and Plan / ED Course  I have reviewed the triage vital signs and the nursing notes.  Pertinent labs & imaging results that were available during my care of the patient were reviewed by me and considered in my medical decision making (see chart for details).  Clinical Course    Pt w/ severe, ongoing back pain with recent diagnosis of chronic compression fractures but no improvement w/ norco at home. Fentanyl given by EMS. Pt uncomfortable but NAD on exam. Neurovasc intact. Gave the patient 2 Percocet, later Toradol after kidney function normal, and obtained CT of T and L-spine to ensure no acute findings given the persistence and worsening of her pain. CT shows severe degenerative changes, subacute  fractures, no retropulsion and no acute findings. After receiving above medications, nursing attempted to ambulate the patient but she was only able to take a few steps with heavy assistance before the pain was too severe. She normally ambulates without assistance. Gave the patient morphine. Unable to adequately control her pain with oral medications given that this is her second presentation for back pain with worsening symptoms, I discussed observation admission with [facility] hospitalist for IV pain medications and physical therapy. Pt admitted for further care.  Final Clinical Impressions(s) / ED Diagnoses   Final diagnoses:  None    New Prescriptions New Prescriptions   No medications on file     Sharlett Iles, MD 06/20/16 [phone]

## 2016-06-21 DIAGNOSIS — M8000XS Age-related osteoporosis with current pathological fracture, unspecified site, sequela: Secondary | ICD-10-CM

## 2016-06-21 DIAGNOSIS — M545 Low back pain: Secondary | ICD-10-CM

## 2016-06-21 DIAGNOSIS — M549 Dorsalgia, unspecified: Secondary | ICD-10-CM | POA: Diagnosis not present

## 2016-06-21 LAB — BASIC METABOLIC PANEL
ANION GAP: 9 (ref 5–15)
BUN: 20 mg/dL (ref 6–20)
CO2: 29 mmol/L (ref 22–32)
Calcium: 10.6 mg/dL — ABNORMAL HIGH (ref 8.9–10.3)
Chloride: 103 mmol/L (ref 101–111)
Creatinine, Ser: 0.99 mg/dL (ref 0.44–1.00)
GFR, EST AFRICAN AMERICAN: 59 mL/min — AB (ref >60)
GFR, EST NON AFRICAN AMERICAN: 51 mL/min — AB (ref >60)
GLUCOSE: 99 mg/dL (ref 65–99)
POTASSIUM: 4.4 mmol/L (ref 3.5–5.1)
SODIUM: 141 mmol/L (ref 135–145)

## 2016-06-21 MED ORDER — BISACODYL 10 MG RE SUPP: 10.0000 mg | Freq: Every day | RECTAL | 0 refills | Status: DC | PRN

## 2016-06-21 MED ORDER — RISPERIDONE 0.5 MG PO TABS: 0.5000 mg | ORAL_TABLET | Freq: Two times a day (BID) | ORAL | Status: DC

## 2016-06-21 MED ORDER — MAGNESIUM HYDROXIDE 400 MG/5ML PO SUSP: 30.0000 mL | Freq: Every day | ORAL | 0 refills | Status: DC | PRN

## 2016-06-21 MED ORDER — MAGNESIUM HYDROXIDE 400 MG/5ML PO SUSP: 30.0000 mL | Freq: Every day | ORAL | 0 refills | Status: DC

## 2016-06-21 MED ORDER — CALCITONIN (SALMON) 200 UNIT/ACT NA SOLN: 1.0000 | Freq: Every day | NASAL | 12 refills | Status: DC

## 2016-06-21 MED ORDER — OXYCODONE HCL 5 MG PO TABS: 5.0000 mg | ORAL_TABLET | ORAL | 0 refills | Status: DC | PRN

## 2016-06-21 NOTE — Clinical Social Work Note (Signed)
Clinical Social Work Assessment  Patient Details  Name: Kristin [name] MRN: [id number] Date of Birth: 01/28/1931  Date of referral:  06/21/16               Reason for consult:  Discharge Planning                Permission sought to share information with:  Facility Contact Representative Permission granted to share information::  Yes, Verbal Permission Granted  Name::        Agency::     Relationship::     Contact Information:     Housing/Transportation Living arrangements for the past 2 months:  Single Family Home Source of Information:  Patient, Adult Children Patient Interpreter Needed:  None Criminal Activity/Legal Involvement Pertinent to Current Situation/Hospitalization:  No - Comment as needed Significant Relationships:  Adult Children Lives with:  Self Do you feel safe going back to the place where you live?  No Need for family participation in patient care:  Yes (Comment)  Care giving concerns:  Family feels pt may do better at [facility] than at home.    Social Worker assessment / plan:  Pt hospitalized on 06/20/16 under observation status from home with intractable back pain. PT / OT have recommended [facility] at D/C. CSW met with pt / family to review recommendations. Pt / family are in agreement with [facility]. SNF search initiated and bed offers provided. Daughter will tour [facility] this afternoon and if pleased, will sign admission papers. Pt / daughter are aware pt will be d/c today. CSW will continue to follow to assist with d/c planning to SNF.  Employment status:  Retired Insurance information:  Managed Medicare PT Recommendations:  Skilled Nursing Facility Information / Referral to community resources:  Skilled Nursing Facility  Patient/Family's Response to care:  Pt / family feels [facility] is needed.  Patient/Family's Understanding of and Emotional Response to Diagnosis, Current Treatment, and Prognosis:  Pt / family are aware of pt's medical status. Pt  is motivated to work with therapy. Family appreciates CSW assistance with d/c planning.  Emotional Assessment Appearance:  Appears stated age Attitude/Demeanor/Rapport:  Other (cooperative) Affect (typically observed):    Orientation:  Oriented to Self, Oriented to Place, Oriented to  Time, Oriented to Situation Alcohol / Substance use:  Not Applicable Psych involvement (Current and /or in the community):  No (Comment)  Discharge Needs  Concerns to be addressed:  Discharge Planning Concerns Readmission within the last 30 days:  No Current discharge risk:  None Barriers to Discharge:  No Barriers Identified   ,  Lee, LCSW  [phone] 06/21/2016, 1:42 PM  

## 2016-06-21 NOTE — Progress Notes (Signed)
Report called to Alerta Briggs(receiving nurse at [facility])

## 2016-06-21 NOTE — Evaluation (Signed)
Physical Therapy Evaluation Patient Details Name: RUSSIA SCHEIDERER MRN: [id number] DOB: 10-Jul-1931 Today's Date: 06/21/2016   History of Present Illness  BERKLEE BATTEY is a 82 y.o. female with medical history significant of HTN, Osteoporosis, Glaucoma, Chronic Back Pain who presents with back pain that has worsened since twisting her back two days ago. Pt's daughter reports back issues began several months ago and she has seen gradual worsening even prior to acute exacerbation.    Clinical Impression  Pt admitted with above diagnosis. Pt currently with functional limitations due to the deficits listed below (see PT Problem List).  Pt will benefit from skilled PT to increase their independence and safety with mobility to allow discharge to the venue listed below.  Pt moving slowly at MIN A level with pain 7-8/10. Pt is normally independent and per daughter she has had gradual decline due to back issues with significant decline in last week.  Pt would benefit from SNF rehab to work towards improving independence and returning to her condo independently.  Pt and daughter are in agreement. If she is unable to go to SNF, then would recommend 24 hour S and HHPT, BSC and youth RW, but feel SNF is best option for pt at this time.    Follow Up Recommendations SNF;Supervision for mobility/OOB;Supervision/Assistance - 24 hour    Equipment Recommendations  Rolling walker with 5" wheels;3in1 (PT) (youth RW)    Recommendations for Other Services       Precautions / Restrictions Precautions Precautions: Back;Fall      Mobility  Bed Mobility Overal bed mobility: Needs Assistance Bed Mobility: Rolling;Sidelying to Sit Rolling: Min guard Sidelying to sit: Min assist       General bed mobility comments: Educated on proper technique with log rolling  Transfers Overall transfer level: Needs assistance Equipment used: Rolling walker (2 wheeled) Transfers: Sit to/from Stand Sit to Stand: Min  assist         General transfer comment: MIN A to power up and cues for hand placment.  Slow and guarded transitional movement.  Ambulation/Gait Ambulation/Gait assistance: Min assist Ambulation Distance (Feet): 20 Feet Assistive device: Rolling walker (2 wheeled) Gait Pattern/deviations: Decreased step length - right;Decreased step length - left;Trunk flexed;Antalgic Gait velocity: decreased Gait velocity interpretation: Below normal speed for age/gender General Gait Details: slow guarded gait with difficulty turning and flexed posture.  Pain increased to 8/10 with gait. RW even on shortest setting was too tall and recommend youth RW for next session  Stairs            Wheelchair Mobility    Modified Rankin (Stroke Patients Only)       Balance Overall balance assessment: Needs assistance   Sitting balance-Leahy Scale: Fair Sitting balance - Comments: guarded due to pain and appears uncomfortable     Standing balance-Leahy Scale: Poor Standing balance comment: requires UE support                             Pertinent Vitals/Pain Pain Assessment: 0-10 Pain Score: 7  Pain Location: low back Pain Intervention(s): Premedicated before session    Home Living Family/patient expects to be discharged to:: Private residence Living Arrangements: Alone Available Help at Discharge: Family;Available PRN/intermittently Type of Home: House (condo) Home Access: Level entry     Home Layout: One level Home Equipment: Grab bars - tub/shower      Prior Function Level of Independence: Independent  Comments: does not drive     Hand Dominance        Extremity/Trunk Assessment   Upper Extremity Assessment: Overall WFL for tasks assessed           Lower Extremity Assessment: Generalized weakness      Cervical / Trunk Assessment: Kyphotic  Communication   Communication: No difficulties  Cognition Arousal/Alertness: Awake/alert Behavior  During Therapy: WFL for tasks assessed/performed Overall Cognitive Status: Within Functional Limits for tasks assessed                      General Comments      Exercises     Assessment/Plan    PT Assessment Patient needs continued PT services  PT Problem List Decreased activity tolerance;Decreased balance;Pain;Decreased mobility;Decreased knowledge of use of DME;Decreased knowledge of precautions          PT Treatment Interventions DME instruction;Gait training;Functional mobility training;Therapeutic activities;Therapeutic exercise;Patient/family education    PT Goals (Current goals can be found in the Care Plan section)  Acute Rehab PT Goals Patient Stated Goal: to decrease pain and return to independence PT Goal Formulation: With patient/family Time For Goal Achievement: 07/05/16 Potential to Achieve Goals: Good    Frequency Min 3X/week   Barriers to discharge        Co-evaluation               End of Session Equipment Utilized During Treatment: Gait belt Activity Tolerance: Patient limited by pain Patient left: in chair;with call bell/phone within reach;with chair alarm set;with family/visitor present Nurse Communication: Mobility status    Functional Assessment Tool Used: objective findings and clinical judgement Functional Limitation: Changing and maintaining body position Changing and Maintaining Body Position Current Status (T3428): At least 20 percent but less than 40 percent impaired, limited or restricted Changing and Maintaining Body Position Goal Status (J6811): At least 1 percent but less than 20 percent impaired, limited or restricted    Time: 0846-0915 PT Time Calculation (min) (ACUTE ONLY): 29 min   Charges:   PT Evaluation $PT Eval Moderate Complexity: 1 Procedure PT Treatments $Gait Training: 8-22 mins   PT G Codes:   PT G-Codes **NOT FOR INPATIENT CLASS** Functional Assessment Tool Used: objective findings and clinical  judgement Functional Limitation: Changing and maintaining body position Changing and Maintaining Body Position Current Status (X7262): At least 20 percent but less than 40 percent impaired, limited or restricted Changing and Maintaining Body Position Goal Status (M3559): At least 1 percent but less than 20 percent impaired, limited or restricted    [facility] LUBECK 06/21/2016, 9:39 AM

## 2016-06-21 NOTE — Clinical Social Work Placement (Signed)
   CLINICAL SOCIAL WORK PLACEMENT  NOTE  Date:  06/21/2016  Patient Details  Name: Kristin [name] MRN: [id number] Date of Birth: [birth year]  Clinical Social Work is seeking post-discharge placement for this patient at the [place] level of care (*CSW will initial, date and re-position this form in  chart as items are completed):  Yes   Patient/family provided with [place] Work Department's list of facilities offering this level of care within the geographic area requested by the patient (or if unable, by the patient's family).  Yes   Patient/family informed of their freedom to choose among providers that offer the needed level of care, that participate in Medicare, Medicaid or managed care program needed by the patient, have an available bed and are willing to accept the patient.  Yes   Patient/family informed of [facility]'s ownership interest in [facility] and [facility], as well as of the fact that they are under no obligation to receive care at these facilities.  PASRR submitted to EDS on 06/21/16     PASRR number received on 06/21/16     Existing PASRR number confirmed on       FL2 transmitted to all facilities in geographic area requested by pt/family on 06/21/16     FL2 transmitted to all facilities within larger geographic area on       Patient informed that his/her managed care company has contracts with or will negotiate with certain facilities, including the following:        Yes   Patient/family informed of bed offers received.  Patient chooses bed at [facility]     Physician recommends and patient chooses bed at      Patient to be transferred to [facility] on 06/21/16.  Patient to be transferred to facility by [facility]     Patient family notified on 06/21/16 of transfer.  Name of family member notified:  DAUGHTER     PHYSICIAN       Additional Comment: Pt / daughter are in  agreement with d/c to [facility] today. [facility] transport is required. Pt / daughter are aware out of pocket costs may be associated with [facility] transport. Medical necessity form completed. Scripts included in d/c packet. D/C summary sent to SNF for review. # for report provided to nsg.   _______________________________________________ Luretha Rued, [place]  [phone] 06/21/2016, 3:08 PM

## 2016-06-21 NOTE — Discharge Summary (Addendum)
Physician Discharge Summary  Kristin [name]:[id number] DOB: [birth year] DOA: 06/20/2016  PCP: Evelina Dun, FNP  Admit date: 06/20/2016 Discharge date: 06/21/2016  Admitted From: home  Disposition:  SNF   Recommendations for Outpatient Follow-up:  1. Will be transferring to SNF 2. Wean pain medications as she improves- follow for constipation   Discharge Condition: stable   CODE STATUS:  Full code   Diet recommendation:  Heart healthy Consultations:      Discharge Diagnoses:  Principal Problem:   Osteoporosis with pathological fracture Active Problems:   Intractable back pain   CKD (chronic kidney disease)   HLD (hyperlipidemia)   HTN (hypertension)   Chronic back pain    Subjective: Pain better controlled with current medications. Feels OK with going to SNF for rehab.   Brief Summary: HPI: Kristin [name] is a 82 y.o. female who lives alone at home and is quite independent at baseline with medical history significant of HTN, Osteoporosis, Glaucoma, Chronic Back Pain who presents with back pain that has worsened since twisting her back two days ago.  Patient states that her back pain really started a few weeks ago and she has been treated with Norco from her primary care physician since that time.  She was told her pain was likely due to arthritis and osteoporosis.  She says since that time the pain has come and gone and has been reasonably controlled with Norco.  Three days prior to admission she was bending over to put her shoes on when she felt something give out in her back and pain has become excrutiating.  She came into the ED on 11/4, the night that this happened, for evaluation and was sent home with Flexeril and Tylenol.  At home pain was not controlled with Norco TID, flexeril BID and intermittent Tylenol.  She was previously independent for ADL's and since three days ago has been weak and barely able to walk.  She also voices since the pain has begun that she has  had a harder time controlling her bladder function mostly due to being slower to get up to use the bathroom secondary to pain.  She mentions she has not had a bowel movement in 4 days.    Hospital Course:  Subacute T 4 compression fracture - pain controled with Oxycodone 14m- can use 1 tab for moderate pain and 2 tabs for severe pain - bowel regimen ordered- given Dulcolax today - PT has recommended SNF  Osteoarthritis and spondylosis of spine - see CT below - no focal neurological symptoms - given Boniva today- cont Calcium, Vit D  HTN - Amlodipine - she has not been taking Valsartan/ HCTZ  HLD - Zetia  CKD 2-3  - stable  Discharge Instructions  Discharge Instructions    Diet - low sodium heart healthy    Complete by:  As directed    Increase activity slowly    Complete by:  As directed        Medication List    STOP taking these medications   cyclobenzaprine 5 MG tablet Commonly known as:  FLEXERIL   HYDROcodone-acetaminophen 5-325 MG tablet Commonly known as:  NORCO/VICODIN   valsartan-hydrochlorothiazide 80-12.5 MG tablet Commonly known as:  DIOVAN-HCT     TAKE these medications   acetaminophen 500 MG tablet Commonly known as:  TYLENOL Take 2 tablets (1,000 mg total) by mouth every 8 (eight) hours. Do not take more than 4000 mg of acetaminophen (Tylenol) in a 24-hour period. Please note  that other medicines that you may be prescribed may have Tylenol as well.   amLODipine 5 MG tablet Commonly known as:  NORVASC Take 1 tablet (5 mg total) by mouth daily.   aspirin 81 MG tablet Take 81 mg by mouth daily.   bisacodyl 10 MG suppository Commonly known as:  DULCOLAX Place 1 suppository (10 mg total) rectally daily as needed for moderate constipation.   calcitonin (salmon) 200 UNIT/ACT nasal spray Commonly known as:  MIACALCIN/FORTICAL Place 1 spray into alternate nostrils daily. Start taking on:  06/22/2016   CENTRUM SILVER PO Take 1 tablet by mouth  daily.   ezetimibe 10 MG tablet Commonly known as:  ZETIA Take 1 tablet (10 mg total) by mouth daily.   ibandronate 150 MG tablet Commonly known as:  BONIVA Take 1 tablet (150 mg total) by mouth every 30 (thirty) days. Take in the morning with a full glass of water, on an empty stomach, and do not take anything else by mouth or lie down for the next 30 min.   magnesium hydroxide 400 MG/5ML suspension Commonly known as:  MILK OF MAGNESIA Take 30 mLs by mouth daily as needed for mild constipation.   meclizine 25 MG tablet Commonly known as:  ANTIVERT TAKE ONE TABLET BY MOUTH THREE TIMES DAILY AS NEEDED What changed:  how much to take  how to take this  when to take this  reasons to take this  additional instructions   oxyCODONE 5 MG immediate release tablet Commonly known as:  Oxy IR/ROXICODONE Take 1-2 tablets (5-10 mg total) by mouth every 4 (four) hours as needed for moderate pain or severe pain.   RESTASIS 0.05 % ophthalmic emulsion Generic drug:  cycloSPORINE Place 1 drop into both eyes daily.   risperiDONE 0.5 MG tablet Commonly known as:  RISPERDAL Take 1 tablet (0.5 mg total) by mouth 2 (two) times daily.   VITAMIN D PO Take 1 tablet by mouth every morning.       Allergies  Allergen Reactions  . Alendronate Nausea Only and Other (See Comments)    Hip pain  . Penicillins Rash    Has patient had a PCN reaction causing immediate rash, facial/tongue/throat swelling, SOB or lightheadedness with hypotension: no Has patient had a PCN reaction causing severe rash involving mucus membranes or skin necrosis: no Has patient had a PCN reaction that required hospitalization no Has patient had a PCN reaction occurring within the last 10 years:no If all of the above answers are "NO", then may proceed with Cephalosporin use.    . Sulfonamide Derivatives Itching and Rash     Procedures/Studies:   Dg Thoracic Spine 2 View  Addendum Date: 06/19/2016   ADDENDUM  REPORT: 06/19/2016 00:01 ADDENDUM: On further evaluation, note that the patient's prior vertebroplasty is at T10, and the new chronic compression deformity is at T5. Electronically Signed   By: Garald Balding M.D.   On: 06/19/2016 00:01   Result Date: 06/19/2016 CLINICAL DATA:  Chronic right mid back pain.  Initial encounter. EXAM: THORACIC SPINE 2 VIEWS COMPARISON:  Thoracic radiographs performed 04/25/2016 FINDINGS: There is a chronic compression deformity at T4, new from September. Vertebroplasty changes are again noted at T9. Mild degenerative change is noted at the upper lumbar spine. Clips are noted within the right upper quadrant, reflecting prior cholecystectomy. IMPRESSION: 1. Chronic compression deformity of T4 is new from September. 2. Changes of vertebroplasty again noted at T9, with mild degenerative change at the upper lumbar spine.  Electronically Signed: By: Garald Balding M.D. On: 06/18/2016 23:55   Dg Lumbar Spine Complete  Result Date: 06/19/2016 CLINICAL DATA:  Chronic lower back pain, worse on the right. Initial encounter. EXAM: LUMBAR SPINE - COMPLETE 4+ VIEW COMPARISON:  MRI of the lumbar spine performed 04/14/2011 FINDINGS: There is no evidence of acute fracture or subluxation. Scattered lateral osteophytes are noted along the lower thoracic and lumbar spine. Endplate sclerotic change is noted at L1-L2, with associated vacuum phenomenon. There is grade 2 anterolisthesis of L5 on S1, reflecting underlying facet disease. The patient is status post vertebroplasty at T10. The visualized bowel gas pattern is unremarkable in appearance; air and stool are noted within the colon. The sacroiliac joints are within normal limits. The liver is unremarkable in appearance. The patient is status post cholecystectomy, with clips noted at the gallbladder fossa. The common bile duct remains normal in caliber. IMPRESSION: 1. No evidence of acute fracture or subluxation along the lumbar spine. 2. Grade 2  anterolisthesis of L5 on S1, reflecting underlying facet disease. Electronically Signed   By: Garald Balding M.D.   On: 06/18/2016 23:59   Ct Thoracic Spine Wo Contrast  Result Date: 06/20/2016 CLINICAL DATA:  Severe back pain. Increasing back pain 3-4 weeks ago. Abnormal x-ray. EXAM: CT THORACIC AND LUMBAR SPINE WITHOUT CONTRAST TECHNIQUE: Multidetector CT imaging of the thoracic and lumbar spine was performed without contrast. Multiplanar CT image reconstructions were also generated. COMPARISON:  Thoracic spine radiographs 06/18/2016 and 911/17. FINDINGS: CT THORACIC SPINE FINDINGS Alignment: AP alignment is anatomic. There is exaggerated thoracic kyphosis centered at T4 and T5. Vertebrae: The superior endplate compression fracture is present at T4 with compressed bone suggesting a subacute fracture there is nearly 50% loss of height compared to the T3 vertebral body. There is no significant retropulsion of bone. Previous vertebral augmentation at T9 is noted. Chronic endplate changes are seen anteriorly at T5-6 and to lesser extent T6-7. Vertebral body heights and marrow density are otherwise normal. Paraspinal and other soft tissues: Atherosclerotic changes are present at the aortic arch and descending aorta without aneurysm. A moderate-sized hiatal hernia is present. Coronary artery calcifications are present. A low-density lesion anteriorly in the left kidney likely represents a simple cyst. Dependent atelectasis is worse on the left. Disc levels: Facet spurring results in moderate right foraminal stenosis at T4-5. Asymmetric degenerative changes noted the costovertebral junction on the right at T9 and T10. This may be secondary to remote trauma. CT LUMBAR SPINE FINDINGS Segmentation: 5 non rib-bearing lumbar type vertebral bodies are present. Alignment: 9 mm grade 2 anterolisthesis is present at L5-S1. There is slight retrolisthesis at L2-3. AP alignment is otherwise anatomic. Rightward curvature of the  lumbar spine is centered at L2. Vertebrae: Vertebral body heights are maintained. Is asymmetric endplate change on the right at L4-5 and L5-S1 and on the left at L1-2 L2-3. Paraspinal and other soft tissues: No atherosclerotic calcifications are present in the aorta and branch vessels. 2 low-density lesions are present within the left kidney. A 4 cm anterior and upper pole lesion is present. A smaller posterolateral and lower pole cyst is present. There is no significant adenopathy. There is some atrophy of the paraspinous musculature. Disc levels: L1-2: Asymmetric left-sided facet hypertrophy contributes to mild left foraminal narrowing. The central canal is patent. L2-3: A mild broad-based disc protrusion is present. Asymmetric endplate spurring on the left contributes to moderate left foraminal stenosis. L3-4: A broad-based disc protrusion is present. Mild foraminal narrowing is  present bilaterally. L4-5: A broad-based disc protrusion is asymmetric on the right. Facet hypertrophy is worse on the left. This results an mild subarticular and foraminal narrowing bilaterally. L5-S1: There is uncovering of a broad-based disc protrusion. Advanced facet hypertrophy is noted bilaterally. The combination results in severe central and bilateral foraminal stenosis, worse on the right. IMPRESSION: 1. T4 superior endplate compression fracture is likely subacute, new since 04/25/2016. There is no significant retropulsion of bone. Compressed marrow is evident. 2. Moderate right foraminal stenosis at T4-5 secondary to facet spurring. 3. Posttraumatic changes on the right at T9 and T10. 4. Extensive atherosclerotic disease including coronary artery disease. 5. Left greater than right dependent atelectasis. 6. Left renal cysts. 7. Multilevel spondylosis of the lumbar spine is most severe at L5-S1 with there is severe central and bilateral foraminal stenosis, worse on the right. 8. Rightward curvature of the lumbar spine is centered  at L2 with asymmetric leftward curvature at L5-S1. Electronically Signed   By: San Morelle M.D.   On: 06/20/2016 16:00   Ct Lumbar Spine Wo Contrast  Result Date: 06/20/2016 CLINICAL DATA:  Severe back pain. Increasing back pain 3-4 weeks ago. Abnormal x-ray. EXAM: CT THORACIC AND LUMBAR SPINE WITHOUT CONTRAST TECHNIQUE: Multidetector CT imaging of the thoracic and lumbar spine was performed without contrast. Multiplanar CT image reconstructions were also generated. COMPARISON:  Thoracic spine radiographs 06/18/2016 and 911/17. FINDINGS: CT THORACIC SPINE FINDINGS Alignment: AP alignment is anatomic. There is exaggerated thoracic kyphosis centered at T4 and T5. Vertebrae: The superior endplate compression fracture is present at T4 with compressed bone suggesting a subacute fracture there is nearly 50% loss of height compared to the T3 vertebral body. There is no significant retropulsion of bone. Previous vertebral augmentation at T9 is noted. Chronic endplate changes are seen anteriorly at T5-6 and to lesser extent T6-7. Vertebral body heights and marrow density are otherwise normal. Paraspinal and other soft tissues: Atherosclerotic changes are present at the aortic arch and descending aorta without aneurysm. A moderate-sized hiatal hernia is present. Coronary artery calcifications are present. A low-density lesion anteriorly in the left kidney likely represents a simple cyst. Dependent atelectasis is worse on the left. Disc levels: Facet spurring results in moderate right foraminal stenosis at T4-5. Asymmetric degenerative changes noted the costovertebral junction on the right at T9 and T10. This may be secondary to remote trauma. CT LUMBAR SPINE FINDINGS Segmentation: 5 non rib-bearing lumbar type vertebral bodies are present. Alignment: 9 mm grade 2 anterolisthesis is present at L5-S1. There is slight retrolisthesis at L2-3. AP alignment is otherwise anatomic. Rightward curvature of the lumbar spine  is centered at L2. Vertebrae: Vertebral body heights are maintained. Is asymmetric endplate change on the right at L4-5 and L5-S1 and on the left at L1-2 L2-3. Paraspinal and other soft tissues: No atherosclerotic calcifications are present in the aorta and branch vessels. 2 low-density lesions are present within the left kidney. A 4 cm anterior and upper pole lesion is present. A smaller posterolateral and lower pole cyst is present. There is no significant adenopathy. There is some atrophy of the paraspinous musculature. Disc levels: L1-2: Asymmetric left-sided facet hypertrophy contributes to mild left foraminal narrowing. The central canal is patent. L2-3: A mild broad-based disc protrusion is present. Asymmetric endplate spurring on the left contributes to moderate left foraminal stenosis. L3-4: A broad-based disc protrusion is present. Mild foraminal narrowing is present bilaterally. L4-5: A broad-based disc protrusion is asymmetric on the right. Facet hypertrophy is worse  on the left. This results an mild subarticular and foraminal narrowing bilaterally. L5-S1: There is uncovering of a broad-based disc protrusion. Advanced facet hypertrophy is noted bilaterally. The combination results in severe central and bilateral foraminal stenosis, worse on the right. IMPRESSION: 1. T4 superior endplate compression fracture is likely subacute, new since 04/25/2016. There is no significant retropulsion of bone. Compressed marrow is evident. 2. Moderate right foraminal stenosis at T4-5 secondary to facet spurring. 3. Posttraumatic changes on the right at T9 and T10. 4. Extensive atherosclerotic disease including coronary artery disease. 5. Left greater than right dependent atelectasis. 6. Left renal cysts. 7. Multilevel spondylosis of the lumbar spine is most severe at L5-S1 with there is severe central and bilateral foraminal stenosis, worse on the right. 8. Rightward curvature of the lumbar spine is centered at L2 with  asymmetric leftward curvature at L5-S1. Electronically Signed   By: San Morelle M.D.   On: 06/20/2016 16:00   Dg Hip Unilat With Pelvis 2-3 Views Right  Result Date: 06/14/2016 CLINICAL DATA:  Chronic right hip pain with increasing symptoms over the trochanteric region over the past 2 weeks. History of osteoporosis. EXAM: DG HIP (WITH OR WITHOUT PELVIS) 2-3V RIGHT COMPARISON:  Right hip series of April 06, 2005 FINDINGS: The bones are subjectively osteopenic. The observed portions of the right hemipelvis appear intact. AP and lateral views of the right hip reveal preservation of the joint space. The articular surfaces of the femoral head and acetabulum remains smoothly rounded. The femoral neck, intertrochanteric, and subtrochanteric regions are normal. No calcific trochanteric bursal changes are observed. IMPRESSION: There is no acute or significant chronic bony abnormality of the right hip. No soft tissue calcification in the region of the trochanteric bursa is observed. Electronically Signed   By: David  Martinique M.D.   On: 06/14/2016 11:01       Discharge Exam: Vitals:   06/20/16 2230 06/21/16 0552  BP: 138/72 (!) 153/87  Pulse: 72 84  Resp: 14 14  Temp: 98.2 F (36.8 C) 98.4 F (36.9 C)   Vitals:   06/20/16 1750 06/20/16 1906 06/20/16 2230 06/21/16 0552  BP: 143/68 (!) 164/80 138/72 (!) 153/87  Pulse: 93 91 72 84  Resp:  18 14 14   Temp:  98.4 F (36.9 C) 98.2 F (36.8 C) 98.4 F (36.9 C)  TempSrc:  Oral Oral Oral  SpO2:  95% 100% 95%  Height:  4' 11"  (1.499 m)      General: Pt is alert, awake, not in acute distress Cardiovascular: RRR, S1/S2 +, no rubs, no gallops Respiratory: CTA bilaterally, no wheezing, no rhonchi Abdominal: Soft, NT, ND, bowel sounds + Extremities: no edema, no cyanosis    The results of significant diagnostics from this hospitalization (including imaging, microbiology, ancillary and laboratory) are listed below for reference.      Microbiology: No results found for this or any previous visit (from the past 240 hour(s)).   Labs: BNP (last 3 results) No results for input(s): BNP in the last 8760 hours. Basic Metabolic Panel:  Recent Labs Lab 06/20/16 1518 06/21/16 0349  NA 141 141  K 3.6 4.4  CL 100* 103  CO2  --  29  GLUCOSE 103* 99  BUN 15 20  CREATININE 1.00 0.99  CALCIUM  --  10.6*   Liver Function Tests: No results for input(s): AST, ALT, ALKPHOS, BILITOT, PROT, ALBUMIN in the last 168 hours. No results for input(s): LIPASE, AMYLASE in the last 168 hours. No results for  input(s): AMMONIA in the last 168 hours. CBC:  Recent Labs Lab 06/20/16 1518 06/20/16 1734  WBC  --  5.6  NEUTROABS  --  2.0  HGB 11.6* 10.0*  HCT 34.0* 28.5*  MCV  --  99.0  PLT  --  179   Cardiac Enzymes: No results for input(s): CKTOTAL, CKMB, CKMBINDEX, TROPONINI in the last 168 hours. BNP: Invalid input(s): POCBNP CBG: No results for input(s): GLUCAP in the last 168 hours. D-Dimer No results for input(s): DDIMER in the last 72 hours. Hgb A1c No results for input(s): HGBA1C in the last 72 hours. Lipid Profile No results for input(s): CHOL, HDL, LDLCALC, TRIG, CHOLHDL, LDLDIRECT in the last 72 hours. Thyroid function studies No results for input(s): TSH, T4TOTAL, T3FREE, THYROIDAB in the last 72 hours.  Invalid input(s): FREET3 Anemia work up No results for input(s): VITAMINB12, FOLATE, FERRITIN, TIBC, IRON, RETICCTPCT in the last 72 hours. Urinalysis    Component Value Date/Time   COLORURINE YELLOW 10/18/2007 0945   APPEARANCEUR CLEAR 10/18/2007 0945   LABSPEC 1.012 10/18/2007 0945   PHURINE 7.0 10/18/2007 0945   GLUCOSEU NEGATIVE 10/18/2007 0945   HGBUR NEGATIVE 10/18/2007 0945   BILIRUBINUR NEGATIVE 10/18/2007 0945   KETONESUR NEGATIVE 10/18/2007 0945   PROTEINUR NEGATIVE 10/18/2007 0945   UROBILINOGEN 0.2 10/18/2007 0945   NITRITE NEGATIVE 10/18/2007 0945   LEUKOCYTESUR  10/18/2007 0945     NEGATIVE MICROSCOPIC NOT DONE ON URINES WITH NEGATIVE PROTEIN, BLOOD, LEUKOCYTES, NITRITE, OR GLUCOSE <1000 mg/dL.   Sepsis Labs Invalid input(s): PROCALCITONIN,  WBC,  LACTICIDVEN Microbiology No results found for this or any previous visit (from the past 240 hour(s)).   Time coordinating discharge: Over 30 minutes  SIGNED:   Debbe Odea, MD  [facility] 06/21/2016, 1:54 PM Pager   If 7PM-7AM, please contact night-coverage [email or web address] Password [email or web address]

## 2016-06-21 NOTE — NC FL2 (Signed)
[place] LEVEL OF CARE SCREENING TOOL     IDENTIFICATION  Patient Name: Kristin [name] Birthdate: [birth year] Sex: female Admission Date (Current Location): 06/20/2016  [facility] and [place] Number:  Herbalist and Address:  [facility],  [place] [address] [place]      Provider Number: [id number]  Attending Physician Name and Address:  Debbe Odea, MD  Relative Name and Phone Number:       Current Level of Care: Hospital Recommended Level of Care: [place] Prior Approval Number:    Date Approved/Denied:   PASRR Number: [id number]  Discharge Plan: SNF    Current Diagnoses: Patient Active Problem List   Diagnosis Date Noted  . Intractable back pain 06/20/2016  . Back pain 06/20/2016  . Other malaise and fatigue 11/26/2013  . Vertigo   . Obesity   . Arthritis of knee, left 07/09/2013  . Need for prophylactic vaccination and inoculation against influenza 07/09/2013  . CKD (chronic kidney disease) 01/01/2013  . Osteoporosis with pathological fracture 01/01/2013  . HLD (hyperlipidemia) 01/01/2013  . HTN (hypertension) 01/01/2013  . Chronic back pain 01/01/2013  . Vertebral compression fracture ([place]) 01/01/2013  . Seasonal allergic rhinitis 01/01/2013    Orientation RESPIRATION BLADDER Height & Weight     Self, Time, Situation, Place  Normal Continent Weight:   Height:  4' 11"  (149.9 cm)  BEHAVIORAL SYMPTOMS/MOOD NEUROLOGICAL BOWEL NUTRITION STATUS  Other (Comment) (no behaviors)   Continent Diet  AMBULATORY STATUS COMMUNICATION OF NEEDS Skin   Limited Assist Verbally Normal                       Personal Care Assistance Level of Assistance  Bathing, Feeding, Dressing Bathing Assistance: Limited assistance Feeding assistance: Independent Dressing Assistance: Limited assistance     Functional Limitations Info  Sight, Hearing, Speech Sight Info: Adequate Hearing Info: Adequate Speech Info:  Adequate    SPECIAL CARE FACTORS FREQUENCY  PT (By licensed PT), OT (By licensed OT)     PT Frequency: 5x wk OT Frequency: 5x wk            Contractures Contractures Info: Not present    Additional Factors Info  Code Status, Allergies               Current Medications (06/21/2016):  This is the current hospital active medication list Current Facility-Administered Medications  Medication Dose Route Frequency Provider Last Rate Last Dose  . amLODipine (NORVASC) tablet 5 mg  5 mg Oral Daily Wallis Bamberg, MD   5 mg at 06/21/16 1011  . bisacodyl (DULCOLAX) suppository 10 mg  10 mg Rectal Daily PRN Wallis Bamberg, MD      . calcitonin (salmon) (MIACALCIN/FORTICAL) nasal spray 1 spray  1 spray Alternating Nares Daily Wallis Bamberg, MD   1 spray at 06/21/16 1011  . cyclobenzaprine (FLEXERIL) tablet 5 mg  5 mg Oral BID PRN Wallis Bamberg, MD      . cycloSPORINE (RESTASIS) 0.05 % ophthalmic emulsion 1 drop  1 drop Both Eyes QHS Wallis Bamberg, MD   1 drop at 06/20/16 2326  . enoxaparin (LOVENOX) injection 40 mg  40 mg Subcutaneous Q24H Wallis Bamberg, MD   40 mg at 06/20/16 2325  . ezetimibe (ZETIA) tablet 10 mg  10 mg Oral Daily Wallis Bamberg, MD   10 mg at 06/21/16 1011  . magnesium hydroxide (MILK OF MAGNESIA) suspension 30  mL  30 mL Oral Daily PRN Wallis Bamberg, MD      . morphine 2 MG/ML injection 2 mg  2 mg Intravenous Q4H PRN Wallis Bamberg, MD   2 mg at 06/21/16 8177  . oxyCODONE (Oxy IR/ROXICODONE) immediate release tablet 5 mg  5 mg Oral Q4H PRN Wallis Bamberg, MD   5 mg at 06/21/16 0800  . risperiDONE (RISPERDAL) tablet 0.5 mg  0.5 mg Oral BID Wallis Bamberg, MD   0.5 mg at 06/21/16 1011     Discharge Medications: Please see discharge summary for a list of discharge medications.  Relevant Imaging Results:  Relevant Lab Results:   Additional Information ss # [id number]  Elester Apodaca, Randall An, LCSW

## 2016-06-21 NOTE — Progress Notes (Signed)
Pt would like to wait to take suppository after D/C to [facility]. Offered twice earlier today (pt had company and was sitting up in chair and wanted to wait until after pain medicine the second time will now prepare discharge and inform [place].

## 2016-06-21 NOTE — Clinical Social Work Placement (Signed)
   CLINICAL SOCIAL WORK PLACEMENT  NOTE  Date:  06/21/2016  Patient Details  Name: Kristin [name] MRN: [id number] Date of Birth: 09-19-1930  Clinical Social Work is seeking post-discharge placement for this patient at the [place] level of care (*CSW will initial, date and re-position this form in  chart as items are completed):  Yes   Patient/family provided with [place] Work Department's list of facilities offering this level of care within the geographic area requested by the patient (or if unable, by the patient's family).  Yes   Patient/family informed of their freedom to choose among providers that offer the needed level of care, that participate in Medicare, Medicaid or managed care program needed by the patient, have an available bed and are willing to accept the patient.  Yes   Patient/family informed of [facility]'s ownership interest in [facility] and [facility], as well as of the fact that they are under no obligation to receive care at these facilities.  PASRR submitted to EDS on 06/21/16     PASRR number received on 06/21/16     Existing PASRR number confirmed on       FL2 transmitted to all facilities in geographic area requested by pt/family on 06/21/16     FL2 transmitted to all facilities within larger geographic area on       Patient informed that his/her managed care company has contracts with or will negotiate with certain facilities, including the following:        Yes   Patient/family informed of bed offers received.  Patient chooses bed at       Physician recommends and patient chooses bed at      Patient to be transferred to   on  .  Patient to be transferred to facility by       Patient family notified on   of transfer.  Name of family member notified:        PHYSICIAN       Additional Comment:    _______________________________________________ Luretha Rued, [place] 06/21/2016,  1:55 PM

## 2016-06-21 NOTE — Evaluation (Signed)
Occupational Therapy Evaluation Patient Details Name: Kristin [name] MRN: [id number] DOB: May 19, 1931 Today's Date: 06/21/2016    History of Present Illness Kristin [name] is a 82 y.o. female with medical history significant of HTN, Osteoporosis, Glaucoma, Chronic Back Pain who presents with back pain that has worsened since twisting her back two days ago. Pt's daughter reports back issues began several months ago and she has seen gradual worsening even prior to acute exacerbation.   Clinical Impression   Pt admitted with back pain. Pt currently with functional limitations due to the deficits listed below (see OT Problem List). Pt will benefit from skilled OT to increase their safety and independence with ADL and functional mobility for ADL to facilitate discharge to venue listed below.      Follow Up Recommendations  SNF    Equipment Recommendations  None recommended by OT       Precautions / Restrictions Precautions Precautions: Back;Fall      Mobility Bed Mobility Overal bed mobility: Needs Assistance Bed Mobility: Rolling;Sidelying to Sit Rolling: Min guard Sidelying to sit: Min assist       General bed mobility comments: pt in chair  Transfers Overall transfer level: Needs assistance Equipment used: Rolling walker (2 wheeled) Transfers: Sit to/from [facility] Sit to Stand: Min assist;Mod assist Stand pivot transfers: Min assist;Mod assist       General transfer comment: MIN- MOD  A to power up and cues for hand placment.  Slow and guarded transitional movement.    Balance Overall balance assessment: Needs assistance   Sitting balance-Leahy Scale: Fair Sitting balance - Comments: guarded due to pain and appears uncomfortable     Standing balance-Leahy Scale: Poor Standing balance comment: requires UE support                            ADL Overall ADL's : Needs assistance/impaired Eating/Feeding: Set up;Sitting    Grooming: Set up;Sitting   Upper Body Bathing: Minimal assitance;Sitting   Lower Body Bathing: Moderate assistance;Sit to/from stand;Cueing for safety;Cueing for sequencing;Cueing for compensatory techniques   Upper Body Dressing : Min guard;Sitting   Lower Body Dressing: Moderate assistance;Cueing for safety;Cueing for sequencing   Toilet Transfer: RW;Cueing for safety;Ambulation;Minimal assistance   Toileting- Clothing Manipulation and Hygiene: Minimal assistance;Sit to/from stand         General ADL Comments: pt will benefit from SNF for rehab unless family able to provide 24/7 A               Pertinent Vitals/Pain Pain Score: 8  Pain Location: low back Pain Descriptors / Indicators: Sore Pain Intervention(s): Monitored during session     Hand Dominance     Extremity/Trunk Assessment Upper Extremity Assessment Upper Extremity Assessment: Overall WFL for tasks assessed   Lower Extremity Assessment Lower Extremity Assessment: Generalized weakness   Cervical / Trunk Assessment Cervical / Trunk Assessment: Kyphotic   Communication Communication Communication: No difficulties   Cognition Arousal/Alertness: Awake/alert Behavior During Therapy: WFL for tasks assessed/performed Overall Cognitive Status: Within Functional Limits for tasks assessed                                Home Living Family/patient expects to be discharged to:: Private residence Living Arrangements: Alone Available Help at Discharge: Family;Available PRN/intermittently Type of Home: House (condo) Home Access: Level entry     Home Layout: One level  Bathroom Shower/Tub: Teacher, early years/pre: Handicapped height     Home Equipment: Grab bars - tub/shower;Bedside commode;Shower seat          Prior Functioning/Environment Level of Independence: Independent        Comments: does not drive        OT Problem List: Decreased strength;Decreased  activity tolerance;Pain   OT Treatment/Interventions: Self-care/ADL training;DME and/or AE instruction;Patient/family education    OT Goals(Current goals can be found in the care plan section) Acute Rehab OT Goals Patient Stated Goal: to decrease pain and return to independence ADL Goals Pt Will Perform Grooming: with modified independence;standing Pt Will Perform Lower Body Dressing: with modified independence;sit to/from stand Pt Will Transfer to Toilet: with modified independence;regular height toilet;ambulating Pt Will Perform Toileting - Clothing Manipulation and hygiene: with modified independence;sit to/from stand  OT Frequency: Min 2X/week    End of Session Equipment Utilized During Treatment: Rolling walker Nurse Communication: Mobility status  Activity Tolerance: Patient tolerated treatment well Patient left: in chair;with call bell/phone within reach;with family/visitor present   Time: 1121-1201 OT Time Calculation (min): 40 min Charges:  OT General Charges $OT Visit: 1 Procedure OT Evaluation $OT Eval Moderate Complexity: 1 Procedure OT Treatments $Self Care/Home Management : 8-22 mins G-Codes: OT G-codes **NOT FOR INPATIENT CLASS** Functional Assessment Tool Used: clinical observation Functional Limitation: Self care Self Care Current Status (J5701): At least 40 percent but less than 60 percent impaired, limited or restricted Self Care Goal Status (X7939): At least 1 percent but less than 20 percent impaired, limited or restricted  Kristin [name], Kristin [name] 06/21/2016, 12:57 PM

## 2016-09-22 ENCOUNTER — Inpatient Hospital Stay (HOSPITAL_COMMUNITY)
Admission: EM | Admit: 2016-09-22 | Discharge: 2016-09-27 | DRG: 872 | Disposition: A | Discharge status: Skilled Nursing Facility | Payer: Medicare Other | Attending: Internal Medicine | Admitting: Internal Medicine

## 2016-09-22 ENCOUNTER — Emergency Department (HOSPITAL_COMMUNITY): Payer: Medicare Other

## 2016-09-22 DIAGNOSIS — I129 Hypertensive chronic kidney disease with stage 1 through stage 4 chronic kidney disease, or unspecified chronic kidney disease: Secondary | ICD-10-CM | POA: Diagnosis present

## 2016-09-22 DIAGNOSIS — D631 Anemia in chronic kidney disease: Secondary | ICD-10-CM | POA: Diagnosis present

## 2016-09-22 DIAGNOSIS — T380X5A Adverse effect of glucocorticoids and synthetic analogues, initial encounter: Secondary | ICD-10-CM | POA: Diagnosis present

## 2016-09-22 DIAGNOSIS — I10 Essential (primary) hypertension: Secondary | ICD-10-CM | POA: Diagnosis present

## 2016-09-22 DIAGNOSIS — A419 Sepsis, unspecified organism: Principal | ICD-10-CM | POA: Diagnosis present

## 2016-09-22 DIAGNOSIS — Z7982 Long term (current) use of aspirin: Secondary | ICD-10-CM

## 2016-09-22 DIAGNOSIS — R52 Pain, unspecified: Secondary | ICD-10-CM

## 2016-09-22 DIAGNOSIS — N183 Chronic kidney disease, stage 3 (moderate): Secondary | ICD-10-CM | POA: Diagnosis present

## 2016-09-22 DIAGNOSIS — E860 Dehydration: Secondary | ICD-10-CM | POA: Diagnosis present

## 2016-09-22 DIAGNOSIS — E7800 Pure hypercholesterolemia, unspecified: Secondary | ICD-10-CM | POA: Diagnosis present

## 2016-09-22 DIAGNOSIS — N179 Acute kidney failure, unspecified: Secondary | ICD-10-CM | POA: Diagnosis present

## 2016-09-22 DIAGNOSIS — M5136 Other intervertebral disc degeneration, lumbar region: Secondary | ICD-10-CM | POA: Diagnosis present

## 2016-09-22 DIAGNOSIS — M545 Low back pain: Secondary | ICD-10-CM | POA: Diagnosis not present

## 2016-09-22 DIAGNOSIS — R141 Gas pain: Secondary | ICD-10-CM

## 2016-09-22 DIAGNOSIS — R531 Weakness: Secondary | ICD-10-CM

## 2016-09-22 DIAGNOSIS — E785 Hyperlipidemia, unspecified: Secondary | ICD-10-CM | POA: Diagnosis present

## 2016-09-22 DIAGNOSIS — G8929 Other chronic pain: Secondary | ICD-10-CM | POA: Diagnosis present

## 2016-09-22 DIAGNOSIS — D630 Anemia in neoplastic disease: Secondary | ICD-10-CM | POA: Diagnosis present

## 2016-09-22 DIAGNOSIS — Z79899 Other long term (current) drug therapy: Secondary | ICD-10-CM

## 2016-09-22 DIAGNOSIS — M549 Dorsalgia, unspecified: Secondary | ICD-10-CM | POA: Diagnosis present

## 2016-09-22 DIAGNOSIS — Z880 Allergy status to penicillin: Secondary | ICD-10-CM

## 2016-09-22 DIAGNOSIS — R32 Unspecified urinary incontinence: Secondary | ICD-10-CM | POA: Diagnosis present

## 2016-09-22 HISTORY — DX: Depression, unspecified: F32.A

## 2016-09-22 HISTORY — DX: Dorsalgia, unspecified: M54.9

## 2016-09-22 HISTORY — DX: Major depressive disorder, single episode, unspecified: F32.9

## 2016-09-22 HISTORY — DX: Chronic kidney disease, stage 3 (moderate): N18.3

## 2016-09-22 HISTORY — DX: Gastrointestinal hemorrhage, unspecified: K92.2

## 2016-09-22 LAB — URINALYSIS, ROUTINE W REFLEX MICROSCOPIC
BILIRUBIN URINE: NEGATIVE
GLUCOSE, UA: NEGATIVE mg/dL
HGB URINE DIPSTICK: NEGATIVE
Ketones, ur: NEGATIVE mg/dL
Leukocytes, UA: NEGATIVE
Nitrite: NEGATIVE
PROTEIN: NEGATIVE mg/dL
Specific Gravity, Urine: 1.011 (ref 1.005–1.030)
pH: 6 (ref 5.0–8.0)

## 2016-09-22 LAB — BASIC METABOLIC PANEL
Anion gap: 10 (ref 5–15)
BUN: 42 mg/dL — ABNORMAL HIGH (ref 6–20)
CO2: 30 mmol/L (ref 22–32)
Calcium: 10.8 mg/dL — ABNORMAL HIGH (ref 8.9–10.3)
Chloride: 103 mmol/L (ref 101–111)
Creatinine, Ser: 1.24 mg/dL — ABNORMAL HIGH (ref 0.44–1.00)
GFR calc Af Amer: 45 mL/min — ABNORMAL LOW (ref >60)
GFR calc non Af Amer: 39 mL/min — ABNORMAL LOW (ref >60)
Glucose, Bld: 177 mg/dL — ABNORMAL HIGH (ref 65–99)
Potassium: 3.7 mmol/L (ref 3.5–5.1)
Sodium: 143 mmol/L (ref 135–145)

## 2016-09-22 LAB — CBC WITH DIFFERENTIAL/PLATELET
Basophils Absolute: 0.5 10*3/uL — ABNORMAL HIGH (ref 0.0–0.1)
Basophils Relative: 4 %
Eosinophils Absolute: 0 10*3/uL (ref 0.0–0.7)
Eosinophils Relative: 0 %
HCT: 26.6 % — ABNORMAL LOW (ref 36.0–46.0)
Hemoglobin: 9.5 g/dL — ABNORMAL LOW (ref 12.0–15.0)
Lymphocytes Relative: 28 %
Lymphs Abs: 3.4 10*3/uL (ref 0.7–4.0)
MCH: 36 pg — ABNORMAL HIGH (ref 26.0–34.0)
MCHC: 35.7 g/dL (ref 30.0–36.0)
MCV: 100.8 fL — ABNORMAL HIGH (ref 78.0–100.0)
Monocytes Absolute: 2.8 10*3/uL — ABNORMAL HIGH (ref 0.1–1.0)
Monocytes Relative: 23 %
Neutro Abs: 5.6 10*3/uL (ref 1.7–7.7)
Neutrophils Relative %: 46 %
Platelets: 169 10*3/uL (ref 150–400)
RBC: 2.64 MIL/uL — ABNORMAL LOW (ref 3.87–5.11)
RDW: 15.5 % (ref 11.5–15.5)
WBC: 12.3 10*3/uL — ABNORMAL HIGH (ref 4.0–10.5)

## 2016-09-22 MED ORDER — OXYCODONE-ACETAMINOPHEN 5-325 MG PO TABS
2.0000 | ORAL_TABLET | Freq: Once | ORAL | Status: DC
  Filled 2016-09-22: qty 2

## 2016-09-22 MED ORDER — MORPHINE SULFATE (PF) 4 MG/ML IV SOLN
4.0000 mg | Freq: Once | INTRAVENOUS | Status: AC
  Administered 2016-09-22: 4 mg via INTRAMUSCULAR
  Filled 2016-09-22: qty 1

## 2016-09-22 NOTE — ED Notes (Signed)
Patient transported to X-ray 

## 2016-09-22 NOTE — ED Notes (Signed)
IV team at bedside 

## 2016-09-22 NOTE — ED Notes (Signed)
Report called to [place] charge woody from charge nurse terry doster.  Called [facility] and [phone] ( spoke with MRI tech ) confirmed pt transfer  Paper work ready for [facility] arrival

## 2016-09-22 NOTE — ED Notes (Signed)
Bed: QU54 Expected date:  Expected time:  Means of arrival:  Comments: 83 yo f lower back pain

## 2016-09-22 NOTE — ED Provider Notes (Addendum)
[place] of low back pain rating to right calf for the past 2 days. She's been unable to walk secondary to pain. Also reports incontinent of bladder and bowel. She had her lower back injected 8 days ago.   Orlie Dakin, MD 09/22/16 2030 1:05 AM patient requesting additional pain medicine. She is alert appropriate Glasgow Coma Score 15. Additional morphine ordered   Orlie Dakin, MD 09/23/16 0110

## 2016-09-22 NOTE — ED Provider Notes (Signed)
[facility] DEPT Provider Note   CSN: [id number] Arrival date & time: 09/22/16  1643     History   Chief Complaint Chief Complaint  Patient presents with  . Back Pain    HPI Kristin [name] is a 83 y.o. female with history of chronic back pain, CKD, hypertension, osteoporosis who presents with a one-week history of worsening low back pain. Patient states over the past few months she has had worsening pain from pulling herself up into an SUV, but denies any new injury or trauma. Patient states she has not been able to ambulate at home at all, even with her walker. She reports intermittent tingling to her right leg, which is not present at this time. Patient reports some loss of control of urination, she is unable to qualify if this is due to pain getting to the bathroom or inability to hold her urine. Patient reports she felt a numbness sensation around her anus, but denies any loss of bowel control. Patient states she was constipated for a few days, but had a soft bowel movement earlier today. Patient had a cortisone injection last week without relief. Patient denies any fevers, chest pain, shortness of breath, abdominal pain, known cancer, bloody stools.  HPI  Past Medical History:  Diagnosis Date  . Allergy   . Arthritis   . Chronic kidney disease   . Glaucoma   . Hiatal hernia   . Hypertension   . Obesity   . Osteoporosis    vertebral fracture in 2012  . Vertigo     Patient Active Problem List   Diagnosis Date Noted  . Intractable back pain 06/20/2016  . Other malaise and fatigue 11/26/2013  . Vertigo   . Obesity   . Arthritis of knee, left 07/09/2013  . Need for prophylactic vaccination and inoculation against influenza 07/09/2013  . CKD (chronic kidney disease) 01/01/2013  . Osteoporosis with pathological fracture 01/01/2013  . HLD (hyperlipidemia) 01/01/2013  . HTN (hypertension) 01/01/2013  . Chronic back pain 01/01/2013  . Vertebral compression fracture ([place])  01/01/2013  . Seasonal allergic rhinitis 01/01/2013    Past Surgical History:  Procedure Laterality Date  . ABDOMINAL HYSTERECTOMY     partial  . vertebralplasty  2012    OB History    No data available       Home Medications    Prior to Admission medications   Medication Sig Start Date End Date Taking? Authorizing Provider  amLODipine (NORVASC) 5 MG tablet Take 1 tablet (5 mg total) by mouth daily. 02/19/14   Cherre Robins, PharmD  aspirin 81 MG tablet Take 81 mg by mouth daily.    Historical Provider, MD  bisacodyl (DULCOLAX) 10 MG suppository Place 1 suppository (10 mg total) rectally daily as needed for moderate constipation. 06/21/16   Debbe Odea, MD  calcitonin, salmon, (MIACALCIN/FORTICAL) 200 UNIT/ACT nasal spray Place 1 spray into alternate nostrils daily. 06/22/16   Debbe Odea, MD  Cholecalciferol (VITAMIN D PO) Take 1 tablet by mouth every morning.    Historical Provider, MD  ezetimibe (ZETIA) 10 MG tablet Take 1 tablet (10 mg total) by mouth daily. 04/05/13   [name], MD  ibandronate (BONIVA) 150 MG tablet Take 1 tablet (150 mg total) by mouth every 30 (thirty) days. Take in the morning with a full glass of water, on an empty stomach, and do not take anything else by mouth or lie down for the next 30 min. 02/19/14   Cherre Robins, PharmD  magnesium hydroxide (MILK OF MAGNESIA) 400 MG/5ML suspension Take 30 mLs by mouth daily. Hold if having > 2 BMs daily 06/21/16   Debbe Odea, MD  meclizine (ANTIVERT) 25 MG tablet TAKE ONE TABLET BY MOUTH THREE TIMES DAILY AS NEEDED Patient taking differently: Take 25 mg by mouth 3 (three) times daily as needed for dizziness. TAKE ONE TABLET BY MOUTH THREE TIMES DAILY AS NEEDED 06/20/14   Chipper Herb, MD  Multiple Vitamins-Minerals (CENTRUM SILVER PO) Take 1 tablet by mouth daily.     Historical Provider, MD  oxyCODONE (OXY IR/ROXICODONE) 5 MG immediate release tablet Take 1-2 tablets (5-10 mg total) by mouth every 4 (four) hours as  needed for moderate pain or severe pain. 06/21/16   Debbe Odea, MD  RESTASIS 0.05 % ophthalmic emulsion Place 1 drop into both eyes daily.  01/31/13   Historical Provider, MD  risperiDONE (RISPERDAL) 0.5 MG tablet Take 1 tablet (0.5 mg total) by mouth 2 (two) times daily. 06/21/16   Debbe Odea, MD    Family History Family History  Problem Relation Age of Onset  . Hypertension Mother   . Osteoporosis Mother   . CVA Mother   . Cancer Neg Hx     Social History Social History  Substance Use Topics  . Smoking status: Never Smoker  . Smokeless tobacco: Never Used  . Alcohol use No     Allergies   Alendronate; Penicillins; and Sulfonamide derivatives   Review of Systems Review of Systems  Constitutional: Negative for chills and fever.  HENT: Negative for facial swelling and sore throat.   Respiratory: Negative for shortness of breath.   Cardiovascular: Negative for chest pain.  Gastrointestinal: Negative for abdominal pain, nausea and vomiting.  Genitourinary: Negative for dysuria.  Musculoskeletal: Positive for back pain.  Skin: Negative for rash and wound.  Neurological: Positive for numbness. Negative for headaches.  Psychiatric/Behavioral: The patient is not nervous/anxious.      Physical Exam Updated Vital Signs BP 125/63   Pulse 109   Temp 98.4 F (36.9 C) (Oral)   Resp (!) 28   SpO2 98%   Physical Exam  Constitutional: She appears well-developed and well-nourished. No distress.  HENT:  Head: Normocephalic and atraumatic.  Mouth/Throat: Oropharynx is clear and moist. No oropharyngeal exudate.  Eyes: Conjunctivae are normal. Pupils are equal, round, and reactive to light. Right eye exhibits no discharge. Left eye exhibits no discharge. No scleral icterus.  Neck: Normal range of motion. Neck supple. No thyromegaly present.  Cardiovascular: Normal rate, regular rhythm, normal heart sounds and intact distal pulses.  Exam reveals no gallop and no friction rub.     No murmur heard. Pulmonary/Chest: Effort normal and breath sounds normal. No stridor. No respiratory distress. She has no wheezes. She has no rales.  Abdominal: Soft. Bowel sounds are normal. She exhibits no distension. There is no tenderness. There is no rebound and no guarding.  Genitourinary: Rectal exam shows anal tone abnormal (may be decreased). Rectal exam shows no external hemorrhoid and no internal hemorrhoid.  Genitourinary Comments: Normal sensation to perianal area; rectal tone could be decreased (could be due to age); soft stool, no blood noted  Musculoskeletal: She exhibits no edema.       Thoracic back: She exhibits no tenderness and no bony tenderness.       Lumbar back: She exhibits tenderness and bony tenderness.  Lymphadenopathy:    She has no cervical adenopathy.  Neurological: She is alert. Coordination normal.  5/5  strength to all 4 extremities, flexion and extension; equal bilateral grip strength; normal sensation to lower extremities  Skin: Skin is warm and dry. No rash noted. She is not diaphoretic. No pallor.  Psychiatric: She has a normal mood and affect.  Nursing note and vitals reviewed.    ED Treatments / Results  Labs (all labs ordered are listed, but only abnormal results are displayed) Labs Reviewed  BASIC METABOLIC PANEL  CBC WITH DIFFERENTIAL/PLATELET    EKG  EKG Interpretation None       Radiology No results found.  Procedures Procedures (including critical care time)  Medications Ordered in ED Medications - No data to display   Initial Impression / Assessment and Plan / ED Course  I have reviewed the triage vital signs and the nursing notes.  Pertinent labs & imaging results that were available during my care of the patient were reviewed by me and considered in my medical decision making (see chart for details).     Considering patient's recent intra-articular injection and inability to walk and possible decreased rectal tone,  patient will be transferred to [facility] for [facility]. CBC shows WBC 12.3. BMP shows BUN and 42, creatinine 1.24, calcium 10.8. UA negative. Lumbar x-ray shows age indeterminate height loss of the L3 vertebral body, recommend correlation for point tenderness; grade 2 anterolisthesis L5-S1. I spoke with Dr. Ellender Hose at [facility] emergency department who will be expecting the patient for MRI of Lumbar spine w and wo contrast to rule out cauda equina, discitis, abscess, or other surgical intervention. Myself and Dr. Winfred Leeds recommend hospitalist admission MRI to the outcome considering patient unable to walk and intractable pain at home. She also evaluated by Dr. Winfred Leeds who guided the patient's management and agrees with plan.  Final Clinical Impressions(s) / ED Diagnoses   Final diagnoses:  None    New Prescriptions New Prescriptions   No medications on file     Caryl Ada 09/22/16 2309    Orlie Dakin, MD 09/23/16 0127

## 2016-09-22 NOTE — ED Triage Notes (Signed)
Pt  Presents from home via EMS. States she has had 2-3 year hx of back pain w/ hx of surgery. Cortisone? Shot last week has not helped. 10/10 pain in her back with tingling shooting down her leg. Taking 10-325 oxycodone w/ relief. Alert and oriented. Ambulatory at home w/ walker.

## 2016-09-23 ENCOUNTER — Emergency Department (HOSPITAL_COMMUNITY): Payer: Medicare Other

## 2016-09-23 ENCOUNTER — Observation Stay (HOSPITAL_COMMUNITY): Payer: Medicare Other

## 2016-09-23 ENCOUNTER — Encounter (HOSPITAL_COMMUNITY): Payer: Self-pay | Admitting: *Deleted

## 2016-09-23 DIAGNOSIS — N183 Chronic kidney disease, stage 3 (moderate): Secondary | ICD-10-CM | POA: Diagnosis not present

## 2016-09-23 DIAGNOSIS — E785 Hyperlipidemia, unspecified: Secondary | ICD-10-CM | POA: Diagnosis not present

## 2016-09-23 DIAGNOSIS — A419 Sepsis, unspecified organism: Principal | ICD-10-CM

## 2016-09-23 DIAGNOSIS — D630 Anemia in neoplastic disease: Secondary | ICD-10-CM | POA: Diagnosis present

## 2016-09-23 DIAGNOSIS — M549 Dorsalgia, unspecified: Secondary | ICD-10-CM

## 2016-09-23 DIAGNOSIS — I10 Essential (primary) hypertension: Secondary | ICD-10-CM

## 2016-09-23 LAB — TROPONIN I

## 2016-09-23 LAB — PROCALCITONIN: Procalcitonin: 0.42 ng/mL

## 2016-09-23 LAB — LACTIC ACID, PLASMA
LACTIC ACID, VENOUS: 1 mmol/L (ref 0.5–1.9)
LACTIC ACID, VENOUS: 1.2 mmol/L (ref 0.5–1.9)

## 2016-09-23 LAB — MRSA PCR SCREENING: MRSA by PCR: NEGATIVE

## 2016-09-23 MED ORDER — ACETAMINOPHEN 650 MG RE SUPP: 650.0000 mg | Freq: Four times a day (QID) | RECTAL | Status: DC | PRN

## 2016-09-23 MED ORDER — ONDANSETRON HCL 4 MG PO TABS
4.0000 mg | ORAL_TABLET | Freq: Four times a day (QID) | ORAL | Status: DC | PRN
Start: 2016-09-23 — End: 2016-09-27
  Filled 2016-09-23: qty 1

## 2016-09-23 MED ORDER — AMLODIPINE BESYLATE 5 MG PO TABS
5.0000 mg | ORAL_TABLET | Freq: Every day | ORAL | Status: DC
  Administered 2016-09-23 – 2016-09-27 (×4): 5 mg via ORAL
  Filled 2016-09-23 (×5): qty 1

## 2016-09-23 MED ORDER — OXYCODONE-ACETAMINOPHEN 10-325 MG PO TABS: 1.0000 | ORAL_TABLET | ORAL | Status: DC

## 2016-09-23 MED ORDER — SODIUM CHLORIDE 0.9 % IV SOLN: INTRAVENOUS | Status: DC

## 2016-09-23 MED ORDER — VITAMIN D 1000 UNITS PO TABS
1000.0000 [IU] | ORAL_TABLET | Freq: Every morning | ORAL | Status: DC
  Administered 2016-09-23 – 2016-09-27 (×4): 1000 [IU] via ORAL
  Filled 2016-09-23 (×5): qty 1

## 2016-09-23 MED ORDER — ONDANSETRON HCL 4 MG/2ML IJ SOLN: 4.0000 mg | Freq: Four times a day (QID) | INTRAMUSCULAR | Status: DC | PRN

## 2016-09-23 MED ORDER — MAGNESIUM HYDROXIDE 400 MG/5ML PO SUSP: 30.0000 mL | Freq: Every day | ORAL | Status: DC | PRN

## 2016-09-23 MED ORDER — SODIUM CHLORIDE 0.9% FLUSH
3.0000 mL | Freq: Two times a day (BID) | INTRAVENOUS | Status: DC
  Administered 2016-09-24 – 2016-09-27 (×4): 3 mL via INTRAVENOUS

## 2016-09-23 MED ORDER — CALCITONIN (SALMON) 200 UNIT/ACT NA SOLN
1.0000 | Freq: Every day | NASAL | Status: DC
  Administered 2016-09-23 – 2016-09-26 (×3): 1 via NASAL
  Filled 2016-09-23: qty 3.7

## 2016-09-23 MED ORDER — OXYCODONE-ACETAMINOPHEN 5-325 MG PO TABS
1.0000 | ORAL_TABLET | ORAL | Status: DC
  Administered 2016-09-23 – 2016-09-27 (×20): 1 via ORAL
  Filled 2016-09-23 (×21): qty 1

## 2016-09-23 MED ORDER — MORPHINE SULFATE (PF) 4 MG/ML IV SOLN
4.0000 mg | Freq: Once | INTRAVENOUS | Status: AC
  Administered 2016-09-23: 4 mg via INTRAVENOUS
  Filled 2016-09-23: qty 1

## 2016-09-23 MED ORDER — POLYETHYLENE GLYCOL 3350 17 G PO PACK
17.0000 g | PACK | Freq: Every day | ORAL | Status: DC
  Administered 2016-09-23 – 2016-09-26 (×3): 17 g via ORAL
  Filled 2016-09-23 (×5): qty 1

## 2016-09-23 MED ORDER — RISPERIDONE 0.5 MG PO TABS
0.5000 mg | ORAL_TABLET | Freq: Two times a day (BID) | ORAL | Status: DC
  Administered 2016-09-23 – 2016-09-27 (×8): 0.5 mg via ORAL
  Filled 2016-09-23 (×10): qty 1

## 2016-09-23 MED ORDER — ADULT MULTIVITAMIN W/MINERALS CH
1.0000 | ORAL_TABLET | Freq: Every day | ORAL | Status: DC
  Administered 2016-09-23 – 2016-09-27 (×4): 1 via ORAL
  Filled 2016-09-23 (×5): qty 1

## 2016-09-23 MED ORDER — EZETIMIBE 10 MG PO TABS
10.0000 mg | ORAL_TABLET | Freq: Every day | ORAL | Status: DC
  Administered 2016-09-23 – 2016-09-27 (×5): 10 mg via ORAL
  Filled 2016-09-23 (×6): qty 1

## 2016-09-23 MED ORDER — CYCLOSPORINE 0.05 % OP EMUL
1.0000 [drp] | Freq: Every day | OPHTHALMIC | Status: DC
  Administered 2016-09-23 – 2016-09-27 (×5): 1 [drp] via OPHTHALMIC
  Filled 2016-09-23 (×5): qty 1

## 2016-09-23 MED ORDER — SODIUM CHLORIDE 0.9 % IV BOLUS (SEPSIS)
1500.0000 mL | Freq: Once | INTRAVENOUS | Status: AC
  Administered 2016-09-23: 1500 mL via INTRAVENOUS

## 2016-09-23 MED ORDER — OXYCODONE HCL 5 MG PO TABS
5.0000 mg | ORAL_TABLET | ORAL | Status: DC
  Administered 2016-09-23 – 2016-09-27 (×20): 5 mg via ORAL
  Filled 2016-09-23 (×21): qty 1

## 2016-09-23 MED ORDER — VANCOMYCIN HCL IN DEXTROSE 1-5 GM/200ML-% IV SOLN
1000.0000 mg | Freq: Every day | INTRAVENOUS | Status: DC
  Administered 2016-09-23 – 2016-09-27 (×5): 1000 mg via INTRAVENOUS
  Filled 2016-09-23 (×5): qty 200

## 2016-09-23 MED ORDER — HYDROCHLOROTHIAZIDE 12.5 MG PO CAPS
12.5000 mg | ORAL_CAPSULE | Freq: Every day | ORAL | Status: DC
  Administered 2016-09-23 – 2016-09-25 (×3): 12.5 mg via ORAL
  Filled 2016-09-23 (×3): qty 1

## 2016-09-23 MED ORDER — ZOLPIDEM TARTRATE 5 MG PO TABS: 5.0000 mg | ORAL_TABLET | Freq: Every evening | ORAL | Status: DC | PRN

## 2016-09-23 MED ORDER — LOSARTAN POTASSIUM 50 MG PO TABS
50.0000 mg | ORAL_TABLET | Freq: Every day | ORAL | Status: DC
  Administered 2016-09-23 – 2016-09-25 (×3): 50 mg via ORAL
  Filled 2016-09-23 (×4): qty 1

## 2016-09-23 MED ORDER — LOSARTAN POTASSIUM-HCTZ 50-12.5 MG PO TABS: 1.0000 | ORAL_TABLET | Freq: Every day | ORAL | Status: DC

## 2016-09-23 MED ORDER — ENOXAPARIN SODIUM 30 MG/0.3ML ~~LOC~~ SOLN
30.0000 mg | SUBCUTANEOUS | Status: DC
  Administered 2016-09-23: 30 mg via SUBCUTANEOUS
  Filled 2016-09-23: qty 0.3

## 2016-09-23 MED ORDER — MECLIZINE HCL 25 MG PO TABS: 25.0000 mg | ORAL_TABLET | Freq: Three times a day (TID) | ORAL | Status: DC | PRN

## 2016-09-23 MED ORDER — SENNOSIDES-DOCUSATE SODIUM 8.6-50 MG PO TABS
2.0000 | ORAL_TABLET | Freq: Two times a day (BID) | ORAL | Status: DC
  Administered 2016-09-24 – 2016-09-27 (×5): 2 via ORAL
  Filled 2016-09-23 (×8): qty 2

## 2016-09-23 MED ORDER — MORPHINE SULFATE (PF) 4 MG/ML IV SOLN: 1.0000 mg | INTRAVENOUS | Status: DC | PRN

## 2016-09-23 MED ORDER — GADOBENATE DIMEGLUMINE 529 MG/ML IV SOLN: 10.0000 mL | Freq: Once | INTRAVENOUS | Status: DC | PRN

## 2016-09-23 MED ORDER — ACETAMINOPHEN 325 MG PO TABS: 650.0000 mg | ORAL_TABLET | Freq: Four times a day (QID) | ORAL | Status: DC | PRN

## 2016-09-23 MED ORDER — DEXTROSE 5 % IV SOLN
2.0000 g | Freq: Every day | INTRAVENOUS | Status: DC
  Administered 2016-09-23 – 2016-09-27 (×5): 2 g via INTRAVENOUS
  Filled 2016-09-23 (×6): qty 2

## 2016-09-23 MED ORDER — ASPIRIN 81 MG PO CHEW
81.0000 mg | CHEWABLE_TABLET | Freq: Every day | ORAL | Status: DC
  Administered 2016-09-23 – 2016-09-27 (×5): 81 mg via ORAL
  Filled 2016-09-23 (×5): qty 1

## 2016-09-23 NOTE — ED Notes (Signed)
Pt c/o LUQ pain, chest wall pain tender palpation, states she feels like its gas. EKG done and given to Dr. Christy Gentles, pt in NAD.

## 2016-09-23 NOTE — ED Provider Notes (Signed)
Mri results noted Pt still with intractable pain and unable to walk due to pain Daughter reports recent "cortisone shot" in past 2 weeks Will need admit due to intractable pain    Ripley Fraise, MD 09/23/16 [phone]

## 2016-09-23 NOTE — ED Notes (Signed)
Spoke with rounding MD, pt okay to go to tele floor, MD will order KUB, Chest xray, and troponin.

## 2016-09-23 NOTE — H&P (Signed)
History and Physical    Kristin [name] UKG:[id number] DOB: 1930-12-22 DOA: 09/22/2016  Referring MD/NP/PA:   PCP: Evelina Dun, FNP   Patient coming from:  The patient is coming from home.  At baseline, pt is partially dependent for most of ADL.   Chief Complaint: Intractable back pain  HPI: Kristin [name] is a 83 y.o. female with medical history significant of chronic back pain, hypertension, hyperlipidemia, vertigo, anemia, chronic kidney disease-stage III, who presents with intractable back pain.  Patient states he has chronic lower back pain, which has worsened since yesterday. She had urinary incontinence at home. Her back pain is constant, 10 out of 10 in severity, sharp, radiating to the right leg. It is associated with left leg weakness. Patient states that she had steroid injection 10 days ago without any help. Taking 10-325 oxycodone with no significant relief. She cannot walk normally. She states that she had mild fever yesterday. Patient does not have any respiratory symptoms. Denies symptoms of UTI. Patient does not have vision change or hearing loss.  ED Course: pt was found to have WBC 12.3, temperature 100.3, tachycardia, tachypnea, stable renal function, negative urinalysis. Patient is placed on telemetry bed for observation.  # MRI-Lumbar showed multilevel degenerative disc disease, but no spinal cord compression. Also showed nonspecific smooth mild dural enhancement without collection. Per radiologist, possibly related to recent intervention. Differential considerations include decreased CSF pressure, venous engorgement, or spinal patchymeningitis. Infection is possible but considered less likely.   Review of Systems:   General: had fevers, chills, no changes in body weight, has fatigue HEENT: no blurry vision, hearing changes or sore throat Respiratory: no dyspnea, coughing, wheezing CV: no chest pain, no palpitations GI: no nausea, vomiting, abdominal pain,  diarrhea, constipation GU: no dysuria, burning on urination, increased urinary frequency, hematuria  Ext: no leg edema Neuro: has right right leg weakness, no vision change or hearing loss Skin: no rash, no skin tear. MSK: has sever lower back pain. Heme: No easy bruising.  Travel history: No recent long distant travel.  Allergy:  Allergies  Allergen Reactions  . Alendronate Nausea Only and Other (See Comments)    Hip pain  . Penicillins Rash    Has patient had a PCN reaction causing immediate rash, facial/tongue/throat swelling, SOB or lightheadedness with hypotension: no Has patient had a PCN reaction causing severe rash involving mucus membranes or skin necrosis: no Has patient had a PCN reaction that required hospitalization no Has patient had a PCN reaction occurring within the last 10 years:no If all of the above answers are "NO", then may proceed with Cephalosporin use.    . Sulfonamide Derivatives Itching and Rash    Past Medical History:  Diagnosis Date  . Allergy   . Arthritis   . Chronic kidney disease   . Glaucoma   . Hiatal hernia   . Hypertension   . Obesity   . Osteoporosis    vertebral fracture in 2012  . Vertigo     Past Surgical History:  Procedure Laterality Date  . ABDOMINAL HYSTERECTOMY     partial  . vertebralplasty  2012    Social History:  reports that she has never smoked. She has never used smokeless tobacco. She reports that she does not drink alcohol or use drugs.  Family History:  Family History  Problem Relation Age of Onset  . Hypertension Mother   . Osteoporosis Mother   . CVA Mother   . Cancer Neg Hx  Prior to Admission medications   Medication Sig Start Date End Date Taking? Authorizing Provider  amLODipine (NORVASC) 5 MG tablet Take 1 tablet (5 mg total) by mouth daily. 02/19/14  Yes Cherre Robins, PharmD  aspirin 81 MG tablet Take 81 mg by mouth daily.   Yes Historical Provider, MD  calcitonin, salmon,  (MIACALCIN/FORTICAL) 200 UNIT/ACT nasal spray Place 1 spray into alternate nostrils daily. 06/22/16  Yes Debbe Odea, MD  Cholecalciferol (VITAMIN D PO) Take 1 tablet by mouth every morning.   Yes Historical Provider, MD  ezetimibe (ZETIA) 10 MG tablet Take 1 tablet (10 mg total) by mouth daily. 04/05/13  Yes [name], MD  ibandronate (BONIVA) 150 MG tablet Take 1 tablet (150 mg total) by mouth every 30 (thirty) days. Take in the morning with a full glass of water, on an empty stomach, and do not take anything else by mouth or lie down for the next 30 min. 02/19/14  Yes Cherre Robins, PharmD  losartan-hydrochlorothiazide (HYZAAR) 50-12.5 MG tablet Take 1 tablet by mouth daily. 08/18/16  Yes Historical Provider, MD  magnesium hydroxide (MILK OF MAGNESIA) 400 MG/5ML suspension Take 30 mLs by mouth daily. Hold if having > 2 BMs daily Patient taking differently: Take 30 mLs by mouth daily as needed for moderate constipation. Hold if having > 2 BMs daily 06/21/16  Yes Debbe Odea, MD  meclizine (ANTIVERT) 25 MG tablet TAKE ONE TABLET BY MOUTH THREE TIMES DAILY AS NEEDED Patient taking differently: Take 25 mg by mouth 3 (three) times daily as needed for dizziness. TAKE ONE TABLET BY MOUTH THREE TIMES DAILY AS NEEDED 06/20/14  Yes Chipper Herb, MD  Multiple Vitamins-Minerals (CENTRUM SILVER PO) Take 1 tablet by mouth daily.    Yes Historical Provider, MD  oxyCODONE-acetaminophen (PERCOCET) 10-325 MG tablet Take 1 tablet by mouth every 4 (four) hours. 09/16/16  Yes Historical Provider, MD  polyethylene glycol powder (GLYCOLAX/MIRALAX) powder Take 17 g by mouth daily. 07/05/16  Yes Historical Provider, MD  RESTASIS 0.05 % ophthalmic emulsion Place 1 drop into both eyes daily.  01/31/13  Yes Historical Provider, MD  risperiDONE (RISPERDAL) 0.5 MG tablet Take 1 tablet (0.5 mg total) by mouth 2 (two) times daily. 06/21/16  Yes Debbe Odea, MD  bisacodyl (DULCOLAX) 10 MG suppository Place 1 suppository (10 mg total)  rectally daily as needed for moderate constipation. Patient not taking: Reported on 09/22/2016 06/21/16   Debbe Odea, MD  oxyCODONE (OXY IR/ROXICODONE) 5 MG immediate release tablet Take 1-2 tablets (5-10 mg total) by mouth every 4 (four) hours as needed for moderate pain or severe pain. Patient not taking: Reported on 09/22/2016 06/21/16   Debbe Odea, MD    Physical Exam: Vitals:   09/23/16 0430 09/23/16 0500 09/23/16 0544 09/23/16 0600  BP: 129/67 116/65 144/65   Pulse: 98 97 95   Resp: 16  18   Temp:      TempSrc:      SpO2: 91% 97% 96%   Weight:    71.7 kg (158 lb 1.1 oz)  Height:    4' 11"  (1.499 m)   General: Not in acute distress HEENT:       Eyes: PERRL, EOMI, no scleral icterus.       ENT: No discharge from the ears and nose, no pharynx injection, no tonsillar enlargement.        Neck: No JVD, no bruit, no mass felt. Heme: No neck lymph node enlargement. Cardiac: S1/S2, RRR, No murmurs, No gallops  or rubs. Respiratory: No rales, wheezing, rhonchi or rubs. GI: Soft, nondistended, nontender, no rebound pain, no organomegaly, BS present. GU: No hematuria Ext: No pitting leg edema bilaterally. 2+DP/PT pulse bilaterally. Musculoskeletal: tenderness in lowe back. Skin: No rashes.  Neuro: Alert, oriented X3, cranial nerves II-XII grossly intact, has right leg weakness, 3/5 in muscle strength.  Psych: Patient is not psychotic, no suicidal or hemocidal ideation.  Labs on Admission: I have personally reviewed following labs and imaging studies  CBC:  Recent Labs Lab 09/22/16 1838  WBC 12.3*  NEUTROABS 5.6  HGB 9.5*  HCT 26.6*  MCV 100.8*  PLT 384   Basic Metabolic Panel:  Recent Labs Lab 09/22/16 1838  NA 143  K 3.7  CL 103  CO2 30  GLUCOSE 177*  BUN 42*  CREATININE 1.24*  CALCIUM 10.8*   GFR: Estimated Creatinine Clearance: 29.1 mL/min (by C-G formula based on SCr of 1.24 mg/dL (H)). Liver Function Tests: No results for input(s): AST, ALT, ALKPHOS,  BILITOT, PROT, ALBUMIN in the last 168 hours. No results for input(s): LIPASE, AMYLASE in the last 168 hours. No results for input(s): AMMONIA in the last 168 hours. Coagulation Profile: No results for input(s): INR, PROTIME in the last 168 hours. Cardiac Enzymes: No results for input(s): CKTOTAL, CKMB, CKMBINDEX, TROPONINI in the last 168 hours. BNP (last 3 results) No results for input(s): PROBNP in the last 8760 hours. HbA1C: No results for input(s): HGBA1C in the last 72 hours. CBG: No results for input(s): GLUCAP in the last 168 hours. Lipid Profile: No results for input(s): CHOL, HDL, LDLCALC, TRIG, CHOLHDL, LDLDIRECT in the last 72 hours. Thyroid Function Tests: No results for input(s): TSH, T4TOTAL, FREET4, T3FREE, THYROIDAB in the last 72 hours. Anemia Panel: No results for input(s): VITAMINB12, FOLATE, FERRITIN, TIBC, IRON, RETICCTPCT in the last 72 hours. Urine analysis:    Component Value Date/Time   COLORURINE YELLOW 09/22/2016 2113   APPEARANCEUR CLEAR 09/22/2016 2113   LABSPEC 1.011 09/22/2016 2113   PHURINE 6.0 09/22/2016 2113   GLUCOSEU NEGATIVE 09/22/2016 2113   HGBUR NEGATIVE 09/22/2016 2113   BILIRUBINUR NEGATIVE 09/22/2016 2113   KETONESUR NEGATIVE 09/22/2016 2113   PROTEINUR NEGATIVE 09/22/2016 2113   UROBILINOGEN 0.2 10/18/2007 0945   NITRITE NEGATIVE 09/22/2016 2113   LEUKOCYTESUR NEGATIVE 09/22/2016 2113   Sepsis Labs: @LABRCNTIP (procalcitonin:4,lacticidven:4) )No results found for this or any previous visit (from the past 240 hour(s)).   Radiological Exams on Admission: Dg Lumbar Spine Complete  Result Date: 09/22/2016 CLINICAL DATA:  Patient with worsening lumbar spine pain. EXAM: LUMBAR SPINE - COMPLETE 4+ VIEW COMPARISON:  CT lumbar spine 06/20/2016 ; lumbar spine radiograph 06/18/2016. FINDINGS: Multilevel degenerative disc disease. Age-indeterminate height loss of L3 vertebral body. Grade 2 anterolisthesis of L5 on S1. Lower lumbar spine facet  degenerative changes. The T11 vertebral body is largely obscured and not well visualized. Cholecystectomy clips. SI joints are unremarkable. IMPRESSION: Age-indeterminate height loss of the L3 vertebral body. Recommend correlation for point tenderness. Grade 2 anterolisthesis L5-S1. Electronically Signed   By: Lovey Newcomer M.D.   On: 09/22/2016 19:12   Mr Lumbar Spine W Wo Contrast  Result Date: 09/23/2016 CLINICAL DATA:  82 y/o F; 1 week history of worsening lower back pain, intermittent tingling in the right leg, loss of control with urination, and numbness sensation around her anus. Recent cortisone injection. EXAM: MRI LUMBAR SPINE WITHOUT AND WITH CONTRAST TECHNIQUE: Multiplanar and multiecho pulse sequences of the lumbar spine were obtained without and with  intravenous contrast. CONTRAST:  10 cc MultiHance COMPARISON:  04/14/2011 lumbar MRI. 06/20/2016 lumbar CT. 09/22/2016 lumbar radiographs appear FINDINGS: Segmentation:  Standard. Alignment: Stable grade 2 L5-S1 anterolisthesis. Mild dextrocurvature with apex at L2 and levocurvature with apex at L5. Vertebrae: There is a moderate anterior compression deformity of the L3 vertebral body superior endplate unchanged in comparison with the prior radiographs without enhancement or edema indicating chronic etiology. Multiple levels of Modic 3 endplate degenerative changes and small Schmorl's nodes. Edema within the right T12 pedicle without appreciable fracture is likely a stress reaction. Multiple small facet effusions most pronounced in the lower thoracic spine and within the left L4-5 facet. Partially visualized is edema and on mild enhancement within the sacrum greatest within the right sacral alum possibly representing insufficiency fracture. Conus medullaris: Extends to the lower L2 level and appears normal. S3 Tarlov cysts. Diffuse mild smooth dural enhancement without collection. Paraspinal and other soft tissues: Multiple nonenhancing renal cysts. Large  common bile duct measuring 12 mm similar to prior lumbar MRI. Disc levels: T12-L1: Small disc bulge with bilateral facet hypertrophy resulting in mild left-greater-than-right foraminal narrowing. No significant canal stenosis. L1-2: Small disc bulge eccentric to the right with mild facet and ligamentum flavum hypertrophy. Mild right foraminal narrowing. No significant canal stenosis. L2-3: Moderate disc bulge with facet and ligamentum flavum hypertrophy. Moderate bilateral foraminal narrowing. Partial effacement of the lateral recesses. No significant canal stenosis. L3-4: Moderate disc bulge with small central protrusion combined with bilateral facet and ligamentum flavum hypertrophy. Moderate left and mild right foraminal narrowing. Partial effacement of lateral recesses and mild canal stenosis. L4-5: Moderate disc bulge with posterior marginal osteophytes combined with right greater than left facet and ligamentum flavum hypertrophy. Moderate left and severe right foraminal narrowing. Moderate canal and lateral recess stenosis. Prominent left-sided ligamentum flavum hypertrophy displaces the cauda equina rightward. L5-S1: Uncovered disc and small disc bulge combined with advanced facet and ligamentum flavum hypertrophy resulting in severe bilateral foraminal narrowing and moderate to severe canal stenosis. IMPRESSION: 1. Moderate progression of lumbar spondylosis from 2012 with advanced facet and discogenic degenerative changes. Stable grade 2 anterolisthesis of L5-S1. 2. Multiple levels of mild-to-moderate foraminal narrowing with severe foraminal narrowing at the right L4-5 and bilateral L5-S1 levels. 3. Moderate L4-5 and moderate to severe L5-S1 multifactorial canal stenosis. 4. Partially visualized is edema and mild enhancement of the sacrum and surrounding soft tissues greatest in the right sacral ala, suspected sacral insufficiency fracture. Correlation with pelvic CT recommended. 5. Nonspecific smooth mild  dural enhancement without collection, possibly related to recent intervention. Differential considerations include decreased CSF pressure, venous engorgement, or spinal patchymeningitis. Infection is possible but considered less likely. No abnormal enhancement of the cauda equina or visualized conus medullaris. Electronically Signed   By: Kristine Garbe M.D.   On: 09/23/2016 04:39     EKG: Not done in ED  Assessment/Plan Principal Problem:   Intractable back pain Active Problems:   CKD (chronic kidney disease), stage III   HLD (hyperlipidemia)   HTN (hypertension)   Anemia   Sepsis (HCC)   Intractable back pain: MRI-Lumbar showed multilevel degenerative disc disease, but no spinal cord compression.  -will place on tele bed for obs -PT/OT -pain control: prn percocet and IV morphine  Possible sepsis: pt meets criteria for sepsis with leukocytosis, fever, tachycardia and tachypnea. Currently hemodynamically stable. Source of infection is not clear. Urinalysis negative. Patient does not have any respiratory symptoms. Pt had recent steroid injection and MRI showed nonspecific  smooth mild dural enhancement without collection, will start Abx empirically. -will start IV Vanco and cefepime -Blood culture x 2 -will get Procalcitonin and trend lactic acid levels per sepsis protocol. -IVF: 1.5L of NS bolus in ED, followed by 100 cc/h   CKD (chronic kidney disease), stage III: stable. Baseline creatinine 1.0-122. Her creatinine is 1.24. -Follow up renal function by  BMP  HLD: Last LDL was 100 on 11/26/13 -Continue home medications: Zocor and Zetia -Check FLP  HTN: -continue Hyzarr  Anemia: Hgb 9.5 which was 10.0 on 06/20/16 -f/u by CBC  Mild hypercalcemia: Calcium 10.8, likely due to dehydration. -IV fluid as above    DVT ppx: SQ Lovenox Code Status: Full code Family Communication:  Yes, patient's daughter at bed side Disposition Plan:  Anticipate discharge back to  previous home environment Consults called:  none Admission status: Obs / tele     Date of Service 09/23/2016    Ivor Costa [facility] Pager [phone]  If 7PM-7AM, please contact night-coverage [email or web address] Password [facility] 09/23/2016, 6:26 AM

## 2016-09-23 NOTE — Progress Notes (Signed)
Pharmacy Antibiotic Note  Kristin [name] is a 83 y.o. female admitted on 09/22/2016 with sepsis.  Pharmacy has been consulted for Vancomycin and Zosyn dosing. Estimated CrCl ~30 ml/min  Plan: Cefepime 2gm IV q24h Vancomycin 1gm IV q24h Will f/u micro data, renal function, and pt's clinical condition Vanc trough prn    Temp (24hrs), Avg:98.9 F (37.2 C), Min:98.4 F (36.9 C), Max:100.3 F (37.9 C)   Recent Labs Lab 09/22/16 1838  WBC 12.3*  CREATININE 1.24*    CrCl cannot be calculated (Unknown ideal weight.).    Allergies  Allergen Reactions  . Alendronate Nausea Only and Other (See Comments)    Hip pain  . Penicillins Rash    Has patient had a PCN reaction causing immediate rash, facial/tongue/throat swelling, SOB or lightheadedness with hypotension: no Has patient had a PCN reaction causing severe rash involving mucus membranes or skin necrosis: no Has patient had a PCN reaction that required hospitalization no Has patient had a PCN reaction occurring within the last 10 years:no If all of the above answers are "NO", then may proceed with Cephalosporin use.    . Sulfonamide Derivatives Itching and Rash    Antimicrobials this admission: 2/9 Vanc >>  2/9 Cefepime >>   Dose adjustments this admission: n/a  Microbiology results: Pending  Thank you for allowing pharmacy to be a part of this patient's care.  Sherlon Handing, PharmD, BCPS Clinical pharmacist, pager [phone] 09/23/2016 6:10 AM

## 2016-09-23 NOTE — Progress Notes (Signed)
Kristin [name] is a 83 y.o. female patient admitted from ED awake, alert - oriented  X 4 - no acute distress noted.  VSS - Blood pressure (!) 159/59, pulse 93, temperature 98.4 F (36.9 C), resp. rate 18, height 4' 11"  (1.499 m), weight 71.7 kg (158 lb 1.1 oz), SpO2 98 %.    IV in place, occlusive dsg intact without redness.  Orientation to room, and floor completed with information packet given to patient/family.  Patient declined safety video at this time.  Admission INP armband ID verified with patient/family, and in place.   SR up x 2, fall assessment complete, with patient and family able to verbalize understanding of risk associated with falls, and verbalized understanding to call nsg before up out of bed.  Call light within reach, patient able to voice, and demonstrate understanding.  Skin, clean-dry- intact without evidence of bruising, or skin tears.   No evidence of skin break down noted on exam.    Will cont to eval and treat per MD orders.  Celine Ahr, RN 09/23/2016 8:24 AM

## 2016-09-23 NOTE — Evaluation (Signed)
Occupational Therapy Evaluation Patient Details Name: Kristin [name] MRN: [id number] DOB: 22-Mar-1931 Today's Date: 09/23/2016    History of Present Illness Pt adm with intractable back pain. MRI showed showed multilevel degenerative disc disease, but no spinal cord compression.  PMH - htn, osteoporosis, htn, vertigo, ckd., chronic back pain   Clinical Impression   This 83 yo female admitted with above presents to acute OT with deficits below (see OT problem list) thus affecting her PLOF per her report of being totally independent with her basic ADLs. She will benefit from continued acute OT with follow up OT at SNF to work back toward a more independent level.    Follow Up Recommendations  Supervision/Assistance - 24 hour;SNF    Equipment Recommendations  Other (comment) (TBD at next venue)       Precautions / Restrictions Precautions Precautions: Fall Restrictions Weight Bearing Restrictions: No      Mobility Bed Mobility Overal bed mobility: Needs Assistance Bed Mobility: Rolling;Sidelying to Sit Rolling: Min assist Sidelying to sit: Min assist       General bed mobility comments: Multiple multimodal cues to perform steps to come to EOB. Extended amount of time to complete. Assist to elevate trunk into sitting.  Transfers Overall transfer level: Needs assistance Equipment used: Rolling walker (2 wheeled) Transfers: Sit to/from [facility] Sit to Stand: +2 physical assistance;Min assist Stand pivot transfers: +2 physical assistance;Min assist       General transfer comment: Assist to bring hips up and extended time to perform. Used walker to take pivotal steps from bed to recliner    Balance Overall balance assessment: Needs assistance Sitting-balance support: Bilateral upper extremity supported;Feet supported Sitting balance-Leahy Scale: Poor Sitting balance - Comments: UE support   Standing balance support: Bilateral upper extremity  supported Standing balance-Leahy Scale: Poor Standing balance comment: walker and min A for static standing                            ADL Overall ADL's : Needs assistance/impaired Eating/Feeding: Set up;Sitting   Grooming: Set up;Supervision/safety;Sitting   Upper Body Bathing: Set up;Supervision/ safety;Sitting   Lower Body Bathing: Maximal assistance (min A +2 sit<>stand)   Upper Body Dressing : Moderate assistance;Sitting   Lower Body Dressing: Maximal assistance (min A +2 sit<>stand)   Toilet Transfer: Minimal assistance;+2 for physical assistance;Stand-pivot;RW Toilet Transfer Details (indicate cue type and reason): Bed>recliner on her right Toileting- Clothing Manipulation and Hygiene: Maximal assistance (min A +2 sit<>stand)          All activities take increased time due to what appears slowness of processing and going off on tangential thoughts                Pertinent Vitals/Pain Pain Assessment: 0-10 Pain Score: 7  Pain Location: back and rt hip Pain Descriptors / Indicators: Aching;Sore Pain Intervention(s): Limited activity within patient's tolerance;Monitored during session;Premedicated before session;Repositioned     Hand Dominance  right   Extremity/Trunk Assessment Upper Extremity Assessment Upper Extremity Assessment: Generalized weakness   Lower Extremity Assessment Lower Extremity Assessment: Generalized weakness;RLE deficits/detail RLE Deficits / Details: strength grossly 3-/5       Communication Communication Communication: No difficulties   Cognition Arousal/Alertness: Awake/alert Behavior During Therapy: WFL for tasks assessed/performed Overall Cognitive Status: No family/caregiver present to determine baseline cognitive functioning Area of Impairment: Attention;Memory;Following commands;Problem solving   Current Attention Level: Sustained (tangential thoughts and speech) Memory: Decreased short-term memory Following  Commands: Follows one step commands with increased time     Problem Solving: Slow processing;Decreased initiation;Requires verbal cues;Requires tactile cues;Difficulty sequencing                Home Living Family/patient expects to be discharged to:: Private residence Living Arrangements: Alone Available Help at Discharge: Family;Available PRN/intermittently Type of Home: House (condo) Home Access: Level entry     Home Layout: One level     Bathroom Shower/Tub: Teacher, early years/pre: Handicapped height     Home Equipment: Grab bars - tub/shower;Bedside commode;Shower seat;Walker - 2 wheels          Prior Functioning/Environment Level of Independence: Independent with assistive device(s)        Comments: Per pt she has been using a rolling walker and has been able to perform her ADL's. Question if this is totally accurate.        OT Problem List: Decreased strength;Decreased activity tolerance;Impaired balance (sitting and/or standing);Pain;Decreased safety awareness;Decreased cognition   OT Treatment/Interventions: Self-care/ADL training;Therapeutic activities;Patient/family education;DME and/or AE instruction;Balance training    OT Goals(Current goals can be found in the care plan section) Acute Rehab OT Goals Patient Stated Goal: Not stated OT Goal Formulation: With patient Time For Goal Achievement: 10/07/16 Potential to Achieve Goals: Good  OT Frequency: Min 2X/week   Barriers to D/C: Decreased caregiver support          Co-evaluation PT/OT/SLP Co-Evaluation/Treatment: Yes Reason for Co-Treatment: For patient/therapist safety   OT goals addressed during session: ADL's and self-care;Strengthening/ROM      End of Session Equipment Utilized During Treatment: Gait belt;Rolling walker Nurse Communication: Mobility status (NT)  Activity Tolerance: Patient limited by pain Patient left: in chair;with call bell/phone within reach;with chair  alarm set   Time: 0941-1006 OT Time Calculation (min): 25 min Charges:  OT General Charges $OT Visit: 1 Procedure OT Evaluation $OT Eval Moderate Complexity: 1 Procedure G-Codes: OT G-codes **NOT FOR INPATIENT CLASS** Functional Assessment Tool Used: Clinical observation Functional Limitation: Self care Self Care Current Status (L9532): At least 60 percent but less than 80 percent impaired, limited or restricted Self Care Goal Status (Y2334): At least 40 percent but less than 60 percent impaired, limited or restricted  Almon Register [phone] 09/23/2016, 10:45 AM

## 2016-09-23 NOTE — ED Notes (Signed)
Pt returned from MRI °

## 2016-09-23 NOTE — Progress Notes (Signed)
Patient seen and examined in room, daughter and RN in room during encounter Pt c/o lUQ pain, left sided chest pain, ekg no actue changes, kub/cxr unremarkable/cycle troponin negative. Patient report no bm , start bowel regimen. Patient report back pain is better.  Continue vanc/zosyn, f/u on culture result.

## 2016-09-23 NOTE — ED Notes (Signed)
Wickline MD at bedside

## 2016-09-23 NOTE — ED Notes (Signed)
Patient transported to MRI with transporter

## 2016-09-23 NOTE — Progress Notes (Signed)
Received report on pt.

## 2016-09-23 NOTE — ED Provider Notes (Signed)
Pt here from [facility] ED for emergent MRI lumbar spine due to back pain and reported incontinence to r/o any emergent spinal emergency Pt awake/alert She can move both of her legs No abd tenderness MRI pending at this time She decline pain meds She does not have any contra-indications for MRI    Ripley Fraise, MD 09/23/16 0155

## 2016-09-23 NOTE — ED Notes (Signed)
Pt transported to MRI 

## 2016-09-23 NOTE — ED Notes (Signed)
Attempted to ambulate in hall, pt unable to get out of bed d/t pain. EDP aware and is at bedside giving update to pt and daughter

## 2016-09-23 NOTE — ED Notes (Signed)
Pt taken to xray before taking pt upstairs.

## 2016-09-23 NOTE — Evaluation (Signed)
Physical Therapy Evaluation Patient Details Name: Kristin [name] MRN: [id number] DOB: [birth year] Today's Date: 09/23/2016   History of Present Illness  Pt adm with intractable back pain. MRI showed showed multilevel degenerative disc disease, but no spinal cord compression.  PMH - htn, osteoporosis, htn, vertigo, ckd., chronic back pain  Clinical Impression  Pt admitted with above diagnosis and presents to PT with functional limitations due to deficits listed below (See PT problem list). Pt needs skilled PT to maximize independence and safety to allow discharge to SNF. Currently pt unable mobilize safely on her own. Pt also reports recent fall at home. Went to SNF after hospitalization in November. Feel she needs SNF prior to return home.     Follow Up Recommendations SNF    Equipment Recommendations  None recommended by PT    Recommendations for Other Services       Precautions / Restrictions Precautions Precautions: Fall Restrictions Weight Bearing Restrictions: No      Mobility  Bed Mobility Overal bed mobility: Needs Assistance Bed Mobility: Rolling;Sidelying to Sit Rolling: Min assist Sidelying to sit: Min assist       General bed mobility comments: Multiple multimodal cues to perform steps to come to EOB. Extended amount of time to complete. Assist to elevate trunk into sitting.  Transfers Overall transfer level: Needs assistance Equipment used: Rolling walker (2 wheeled) Transfers: Sit to/from [facility] Sit to Stand: +2 physical assistance;Min assist Stand pivot transfers: +2 physical assistance;Min assist       General transfer comment: Assist to bring hips up and extended time to perform. Used walker to take pivotal steps from bed to recliner  Ambulation/Gait                Stairs            Wheelchair Mobility    Modified Rankin (Stroke Patients Only)       Balance Overall balance assessment: Needs  assistance Sitting-balance support: Bilateral upper extremity supported;Feet supported Sitting balance-Leahy Scale: Poor Sitting balance - Comments: UE support   Standing balance support: Bilateral upper extremity supported Standing balance-Leahy Scale: Poor Standing balance comment: walker and min A for static standing                             Pertinent Vitals/Pain Pain Assessment: 0-10 Pain Score: 7  Pain Location: back and rt hip Pain Intervention(s): Limited activity within patient's tolerance;Monitored during session;Premedicated before session;Repositioned    Home Living Family/patient expects to be discharged to:: Private residence Living Arrangements: Alone Available Help at Discharge: Family;Available PRN/intermittently Type of Home: House (condo) Home Access: Level entry     Home Layout: One level Home Equipment: Grab bars - tub/shower;Bedside commode;Shower seat;Walker - 2 wheels      Prior Function Level of Independence: Independent with assistive device(s)         Comments: Per pt she has been using a rolling walker and has been able to perform her ADL's. Question if this is totally accurate.     Hand Dominance        Extremity/Trunk Assessment   Upper Extremity Assessment Upper Extremity Assessment: Defer to OT evaluation    Lower Extremity Assessment Lower Extremity Assessment: Generalized weakness;RLE deficits/detail RLE Deficits / Details: strength grossly 3-/5       Communication   Communication: No difficulties  Cognition Arousal/Alertness: Awake/alert Behavior During Therapy: WFL for tasks assessed/performed Overall Cognitive Status: No family/caregiver  present to determine baseline cognitive functioning Area of Impairment: Attention;Memory;Following commands;Problem solving   Current Attention Level: Sustained (tangential thoughts and speech) Memory: Decreased short-term memory Following Commands: Follows one step  commands with increased time     Problem Solving: Slow processing;Decreased initiation;Requires verbal cues;Requires tactile cues;Difficulty sequencing      General Comments      Exercises     Assessment/Plan    PT Assessment Patient needs continued PT services  PT Problem List Decreased strength;Decreased activity tolerance;Decreased balance;Decreased mobility;Decreased cognition;Decreased knowledge of use of DME;Pain          PT Treatment Interventions DME instruction;Gait training;Functional mobility training;Therapeutic activities;Therapeutic exercise;Balance training;Patient/family education;Cognitive remediation    PT Goals (Current goals can be found in the Care Plan section)  Acute Rehab PT Goals Patient Stated Goal: Not stated PT Goal Formulation: With patient Time For Goal Achievement: 10/07/16 Potential to Achieve Goals: Good    Frequency Min 3X/week   Barriers to discharge Decreased caregiver support Lives alone    Co-evaluation               End of Session Equipment Utilized During Treatment: Gait belt Activity Tolerance: Patient limited by fatigue Patient left: in chair;with call bell/phone within reach;with chair alarm set Nurse Communication: Mobility status (nurse tech)    Functional Assessment Tool Used: clinical judgement Functional Limitation: Mobility: Walking and moving around Mobility: Walking and Moving Around Current Status [phone]): At least 40 percent but less than 60 percent impaired, limited or restricted Mobility: Walking and Moving Around Goal Status [phone]): At least 1 percent but less than 20 percent impaired, limited or restricted    Time: 0941-1006 PT Time Calculation (min) (ACUTE ONLY): 25 min   Charges:   PT Evaluation $PT Eval Moderate Complexity: 1 Procedure     PT G Codes:   PT G-Codes **NOT FOR INPATIENT CLASS** Functional Assessment Tool Used: clinical judgement Functional Limitation: Mobility: Walking and  moving around Mobility: Walking and Moving Around Current Status (V6681): At least 40 percent but less than 60 percent impaired, limited or restricted Mobility: Walking and Moving Around Goal Status [phone]): At least 1 percent but less than 20 percent impaired, limited or restricted    [place] 09/23/2016, 10:33 AM Suanne Marker PT [phone]

## 2016-09-24 DIAGNOSIS — M549 Dorsalgia, unspecified: Secondary | ICD-10-CM | POA: Diagnosis not present

## 2016-09-24 LAB — CBC
HEMATOCRIT: 26 % — AB (ref 36.0–46.0)
Hemoglobin: 9 g/dL — ABNORMAL LOW (ref 12.0–15.0)
MCH: 35.4 pg — ABNORMAL HIGH (ref 26.0–34.0)
MCHC: 34.6 g/dL (ref 30.0–36.0)
MCV: 102.4 fL — ABNORMAL HIGH (ref 78.0–100.0)
PLATELETS: 149 10*3/uL — AB (ref 150–400)
RBC: 2.54 MIL/uL — ABNORMAL LOW (ref 3.87–5.11)
RDW: 16 % — AB (ref 11.5–15.5)
WBC: 7.9 10*3/uL (ref 4.0–10.5)

## 2016-09-24 LAB — BASIC METABOLIC PANEL
Anion gap: 12 (ref 5–15)
BUN: 22 mg/dL — ABNORMAL HIGH (ref 6–20)
CHLORIDE: 105 mmol/L (ref 101–111)
CO2: 26 mmol/L (ref 22–32)
CREATININE: 0.99 mg/dL (ref 0.44–1.00)
Calcium: 10.3 mg/dL (ref 8.9–10.3)
GFR calc non Af Amer: 51 mL/min — ABNORMAL LOW (ref >60)
GFR, EST AFRICAN AMERICAN: 59 mL/min — AB (ref >60)
Glucose, Bld: 174 mg/dL — ABNORMAL HIGH (ref 65–99)
Potassium: 4 mmol/L (ref 3.5–5.1)
SODIUM: 143 mmol/L (ref 135–145)

## 2016-09-24 MED ORDER — ENOXAPARIN SODIUM 40 MG/0.4ML ~~LOC~~ SOLN
40.0000 mg | SUBCUTANEOUS | Status: DC
  Administered 2016-09-24 – 2016-09-26 (×3): 40 mg via SUBCUTANEOUS
  Filled 2016-09-24 (×3): qty 0.4

## 2016-09-24 NOTE — Progress Notes (Addendum)
PROGRESS NOTE  Kristin [name] FVC:[id number] DOB: Sep 19, 1930 DOA: 09/22/2016 PCP: [name], MD  HPI/Recap of past 24 hours:  Report feeling better, still has pain , not able to ambulate,  t max 100.3 Daughter at bedside  Assessment/Plan: Principal Problem:   Intractable back pain Active Problems:   CKD (chronic kidney disease), stage III   HLD (hyperlipidemia)   HTN (hypertension)   Anemia   Sepsis ([place])   Intractable back pain/unable to ambulate / with possible infection/sepsis presented on admission: MRI-Lumbar showed multilevel degenerative disc disease, but no spinal cord compression.  "Nonspecific smooth mild dural enhancement without collection, possibly related to recent intervention. Differential considerations include decreased CSF pressure, venous engorgement, or spinal patchymeningitis. Infection is possible but considered less likely." "Partially visualized is edema and mild enhancement of the sacrum and surrounding soft tissues greatest in the right sacral ala, suspected sacral insufficiency fracture" --PT/OT/-pain control: prn percocet and IV morphine -may need SNF  Sepsis:  pt meets criteria for sepsis with leukocytosis, fever, tachycardia and tachypnea. procaltitonin 0.4 on admission  Urinalysis negative. Patient does not have any respiratory symptoms. cxr no infiltrate,  Pt had recent steroid injection and MRI showed nonspecific smooth mild dural enhancement without collection, she has increasing back pain after the steroid injection, not able to ambulate -Blood culture in process - Abx empirically started on admission with vanc and cefepime, fever subsiding, back pain improved, leukocytosis resolved. Tachycardia resolved, - Continue abx for now awaiting blood culture result, consider repeat MRI spine if patient still not able to ambulate, ( patient could have an early presentation of spine infection)   AKI on CKD (chronic kidney disease), stage  III:  Cr 1,24 on admission, cr 0.99 the next morning. -Follow up renal function by  BMP  HLD: Last LDL was 100 on 11/26/13 -Continue home medications: Zocor and Zetia -Check FLP  HTN: -continue Hyzarr  Anemia: Hgb 9.5 which was 10.0 on 06/20/16 -f/u by CBC  Mild hypercalcemia: Calcium 10.8, likely due to dehydration. -normalized with hydration  Body mass index is 31.93 kg/m.   DVT ppx: SQ Lovenox Code Status: Full code Family Communication:  Yes, patient's daughter at bed side Disposition Plan: SNF, likely patient is not able to ambulate by herself safely   Consultants:  none  Procedures:  MRI lumbar spine  Antibiotics:  vanc/cefepime   Objective: BP (!) 129/49 (BP Location: Right Arm)   Pulse 92   Temp 97.9 F (36.6 C) (Oral)   Resp 15   Ht 4' 11"  (1.499 m)   Wt 71.7 kg (158 lb 1.1 oz)   SpO2 98%   BMI 31.93 kg/m   Intake/Output Summary (Last 24 hours) at 09/24/16 1558 Last data filed at 09/23/16 1836  Gross per 24 hour  Intake                0 ml  Output              300 ml  Net             -300 ml   Filed Weights   09/23/16 0600  Weight: 71.7 kg (158 lb 1.1 oz)    Exam:   General:  Frail,  NAD  Cardiovascular: RRR  Respiratory: CTABL  Abdomen: Soft/ND/NT, positive BS  Musculoskeletal: No Edema  Neuro: aaox3  Data Reviewed: Basic Metabolic Panel:  Recent Labs Lab 09/22/16 1838 09/24/16 1029  NA 143 143  K 3.7 4.0  CL 103 105  CO2 30 26  GLUCOSE 177* 174*  BUN 42* 22*  CREATININE 1.24* 0.99  CALCIUM 10.8* 10.3   Liver Function Tests: No results for input(s): AST, ALT, ALKPHOS, BILITOT, PROT, ALBUMIN in the last 168 hours. No results for input(s): LIPASE, AMYLASE in the last 168 hours. No results for input(s): AMMONIA in the last 168 hours. CBC:  Recent Labs Lab 09/22/16 1838 09/24/16 1029  WBC 12.3* 7.9  NEUTROABS 5.6  --   HGB 9.5* 9.0*  HCT 26.6* 26.0*  MCV 100.8* 102.4*  PLT 169 149*   Cardiac  Enzymes:    Recent Labs Lab 09/23/16 0740 09/23/16 1415 09/23/16 1905  TROPONINI <0.03 <0.03 <0.03   BNP (last 3 results) No results for input(s): BNP in the last 8760 hours.  ProBNP (last 3 results) No results for input(s): PROBNP in the last 8760 hours.  CBG: No results for input(s): GLUCAP in the last 168 hours.  Recent Results (from the past 240 hour(s))  Culture, blood (x 2)     Status: None (Preliminary result)   Collection Time: 09/23/16  7:35 AM  Result Value Ref Range Status   Specimen Description BLOOD RIGHT ANTECUBITAL  Final   Special Requests   Final    BOTTLES DRAWN AEROBIC AND ANAEROBIC 10CC AER 5CC ANA   Culture NO GROWTH 1 DAY  Final   Report Status PENDING  Incomplete  Culture, blood (x 2)     Status: None (Preliminary result)   Collection Time: 09/23/16  7:40 AM  Result Value Ref Range Status   Specimen Description BLOOD LEFT HAND  Final   Special Requests BOTTLES DRAWN AEROBIC ONLY 10CC  Final   Culture NO GROWTH 1 DAY  Final   Report Status PENDING  Incomplete  MRSA PCR Screening     Status: None   Collection Time: 09/23/16 11:51 AM  Result Value Ref Range Status   MRSA by PCR NEGATIVE NEGATIVE Final    Comment:        The GeneXpert MRSA Assay (FDA approved for NASAL specimens only), is one component of a comprehensive MRSA colonization surveillance program. It is not intended to diagnose MRSA infection nor to guide or monitor treatment for MRSA infections.      Studies: No results found.  Scheduled Meds: . amLODipine  5 mg Oral Daily  . aspirin  81 mg Oral Daily  . calcitonin (salmon)  1 spray Alternating Nares Daily  . ceFEPime (MAXIPIME) IV  2 g Intravenous Q0600  . cholecalciferol  1,000 Units Oral q morning - 10a  . cycloSPORINE  1 drop Both Eyes Daily  . enoxaparin (LOVENOX) injection  40 mg Subcutaneous Q24H  . ezetimibe  10 mg Oral Daily  . losartan  50 mg Oral Daily   And  . hydrochlorothiazide  12.5 mg Oral Daily  .  multivitamin with minerals  1 tablet Oral Daily  . oxyCODONE-acetaminophen  1 tablet Oral Q4H   And  . oxyCODONE  5 mg Oral Q4H  . polyethylene glycol  17 g Oral Daily  . risperiDONE  0.5 mg Oral BID  . senna-docusate  2 tablet Oral BID  . sodium chloride flush  3 mL Intravenous Q12H  . vancomycin  1,000 mg Intravenous Q0600    Continuous Infusions:   Time spent: 78mns  Keyleigh Manninen MD, PhD  [facility] Pager [phone] If 7PM-7AM, please contact night-coverage at [email or web address], password [email or web address] [facility]2/05/2017, 3:58 PM  LOS: 0 days

## 2016-09-24 NOTE — Care Management Obs Status (Signed)
[place] NOTIFICATION   Patient Details  Name: Kristin [name] MRN: [id number] Date of Birth: [birth year]   Medicare Observation Status Notification Given:  Yes    Vylet, Maffia, RN 09/24/2016, 4:43 PM

## 2016-09-25 DIAGNOSIS — Z880 Allergy status to penicillin: Secondary | ICD-10-CM | POA: Diagnosis not present

## 2016-09-25 DIAGNOSIS — N179 Acute kidney failure, unspecified: Secondary | ICD-10-CM | POA: Diagnosis present

## 2016-09-25 DIAGNOSIS — A419 Sepsis, unspecified organism: Secondary | ICD-10-CM | POA: Diagnosis present

## 2016-09-25 DIAGNOSIS — G8929 Other chronic pain: Secondary | ICD-10-CM | POA: Diagnosis present

## 2016-09-25 DIAGNOSIS — Z7982 Long term (current) use of aspirin: Secondary | ICD-10-CM | POA: Diagnosis not present

## 2016-09-25 DIAGNOSIS — T380X5A Adverse effect of glucocorticoids and synthetic analogues, initial encounter: Secondary | ICD-10-CM | POA: Diagnosis present

## 2016-09-25 DIAGNOSIS — M5136 Other intervertebral disc degeneration, lumbar region: Secondary | ICD-10-CM | POA: Diagnosis present

## 2016-09-25 DIAGNOSIS — I129 Hypertensive chronic kidney disease with stage 1 through stage 4 chronic kidney disease, or unspecified chronic kidney disease: Secondary | ICD-10-CM | POA: Diagnosis present

## 2016-09-25 DIAGNOSIS — E860 Dehydration: Secondary | ICD-10-CM | POA: Diagnosis present

## 2016-09-25 DIAGNOSIS — N183 Chronic kidney disease, stage 3 (moderate): Secondary | ICD-10-CM | POA: Diagnosis present

## 2016-09-25 DIAGNOSIS — R32 Unspecified urinary incontinence: Secondary | ICD-10-CM | POA: Diagnosis present

## 2016-09-25 DIAGNOSIS — D631 Anemia in chronic kidney disease: Secondary | ICD-10-CM | POA: Diagnosis present

## 2016-09-25 DIAGNOSIS — M545 Low back pain: Secondary | ICD-10-CM | POA: Diagnosis present

## 2016-09-25 DIAGNOSIS — M549 Dorsalgia, unspecified: Secondary | ICD-10-CM | POA: Diagnosis not present

## 2016-09-25 DIAGNOSIS — E785 Hyperlipidemia, unspecified: Secondary | ICD-10-CM | POA: Diagnosis present

## 2016-09-25 DIAGNOSIS — Z79899 Other long term (current) drug therapy: Secondary | ICD-10-CM | POA: Diagnosis not present

## 2016-09-25 LAB — COMPREHENSIVE METABOLIC PANEL
ALBUMIN: 2.7 g/dL — AB (ref 3.5–5.0)
ALK PHOS: 87 U/L (ref 38–126)
ALT: 11 U/L — ABNORMAL LOW (ref 14–54)
AST: 17 U/L (ref 15–41)
Anion gap: 10 (ref 5–15)
BILIRUBIN TOTAL: 0.6 mg/dL (ref 0.3–1.2)
BUN: 21 mg/dL — AB (ref 6–20)
CALCIUM: 10.3 mg/dL (ref 8.9–10.3)
CO2: 27 mmol/L (ref 22–32)
Chloride: 103 mmol/L (ref 101–111)
Creatinine, Ser: 1.15 mg/dL — ABNORMAL HIGH (ref 0.44–1.00)
GFR calc Af Amer: 49 mL/min — ABNORMAL LOW (ref >60)
GFR calc non Af Amer: 42 mL/min — ABNORMAL LOW (ref >60)
GLUCOSE: 110 mg/dL — AB (ref 65–99)
POTASSIUM: 3.6 mmol/L (ref 3.5–5.1)
SODIUM: 140 mmol/L (ref 135–145)
TOTAL PROTEIN: 4.8 g/dL — AB (ref 6.5–8.1)

## 2016-09-25 LAB — MAGNESIUM: Magnesium: 1.7 mg/dL (ref 1.7–2.4)

## 2016-09-25 LAB — CBC
HEMATOCRIT: 22.4 % — AB (ref 36.0–46.0)
HEMOGLOBIN: 7.7 g/dL — AB (ref 12.0–15.0)
MCH: 34.5 pg — ABNORMAL HIGH (ref 26.0–34.0)
MCHC: 34.4 g/dL (ref 30.0–36.0)
MCV: 100.4 fL — ABNORMAL HIGH (ref 78.0–100.0)
Platelets: 145 10*3/uL — ABNORMAL LOW (ref 150–400)
RBC: 2.23 MIL/uL — ABNORMAL LOW (ref 3.87–5.11)
RDW: 15.7 % — ABNORMAL HIGH (ref 11.5–15.5)
WBC: 6.9 10*3/uL (ref 4.0–10.5)

## 2016-09-25 NOTE — Progress Notes (Signed)
PROGRESS NOTE  Kristin [name] GEZ:[id number] DOB: February 23, 1931 DOA: 09/22/2016 PCP: [name], MD  HPI/Recap of past 24 hours:  Sitting in chair,  still has pain , not able to ambulate,  t max 98 She report she has loss of bowel and bladder control prior to coming to the hospital, it has improved, Daughter at bedside  Assessment/Plan: Principal Problem:   Intractable back pain Active Problems:   CKD (chronic kidney disease), stage III   HLD (hyperlipidemia)   HTN (hypertension)   Anemia   Sepsis ([place])   Intractable back pain/unable to ambulate / with possible infection/sepsis presented on admission: MRI-Lumbar showed multilevel degenerative disc disease, but no spinal cord compression.  "Nonspecific smooth mild dural enhancement without collection, possibly related to recent intervention. Differential considerations include decreased CSF pressure, venous engorgement, or spinal patchymeningitis. Infection is possible but considered less likely." "Partially visualized is edema and mild enhancement of the sacrum and surrounding soft tissues greatest in the right sacral ala, suspected sacral insufficiency fracture" --PT/OT/-pain control: prn percocet and IV morphine Clinically improving on iv abx, consider repeat mri lumber spine? To r/o evolving spine infection  Sepsis:  pt meets criteria for sepsis with leukocytosis, fever, tachycardia and tachypnea. procaltitonin 0.4 on admission  Urinalysis negative. Patient does not have any respiratory symptoms. cxr no infiltrate,  Pt had recent steroid injection and MRI showed nonspecific smooth mild dural enhancement without collection, she has increasing back pain after the steroid injection, not able to ambulate -Blood culture in process - Abx empirically started on admission with vanc and cefepime, fever subsiding, back pain improved, leukocytosis resolved. Tachycardia resolved, - Continue abx for now awaiting blood culture  result, consider repeat MRI spine if patient still not able to ambulate, ( patient could have an early presentation of spine infection)   AKI on CKD (chronic kidney disease), stage III:  ua unremarkable Cr 1,24 on admission, cr 0.99 the next morning. -Follow up renal function by  BMP  HLD: Last LDL was 100 on 11/26/13 -Continue home medications: Zocor and Zetia -Check FLP  HTN: -continue Hyzarr  Anemia: Hgb 9.5 which was 10.0 on 06/20/16 -acute drop of hgb 7.7, anemia work up  Mild hypercalcemia: Calcium 10.8, likely due to dehydration. -normalized with hydration  Body mass index is 31.93 kg/m.   DVT ppx: SQ Lovenox Code Status: Full code Family Communication:  Yes, patient's daughter at bed side Disposition Plan: SNF vs home health, patient is not able to ambulate by herself safely   Consultants:  none  Procedures:  MRI lumbar spine  Antibiotics:  vanc/cefepime   Objective: BP 125/68   Pulse 89   Temp 97.5 F (36.4 C) (Oral)   Resp 19   Ht 4' 11"  (1.499 m)   Wt 71.7 kg (158 lb 1.1 oz)   SpO2 98%   BMI 31.93 kg/m   Intake/Output Summary (Last 24 hours) at 09/25/16 1113 Last data filed at 09/25/16 0300  Gross per 24 hour  Intake              250 ml  Output                0 ml  Net              250 ml   Filed Weights   09/23/16 0600  Weight: 71.7 kg (158 lb 1.1 oz)    Exam:   General:  Frail,  NAD  Cardiovascular: RRR  Respiratory: CTABL  Abdomen:  Soft/ND/NT, positive BS  Musculoskeletal: No Edema  Neuro: aaox3  Data Reviewed: Basic Metabolic Panel:  Recent Labs Lab 09/22/16 1838 09/24/16 1029 09/25/16 0500  NA 143 143 140  K 3.7 4.0 3.6  CL 103 105 103  CO2 30 26 27   GLUCOSE 177* 174* 110*  BUN 42* 22* 21*  CREATININE 1.24* 0.99 1.15*  CALCIUM 10.8* 10.3 10.3  MG  --   --  1.7   Liver Function Tests:  Recent Labs Lab 09/25/16 0500  AST 17  ALT 11*  ALKPHOS 87  BILITOT 0.6  PROT 4.8*  ALBUMIN 2.7*    No results for input(s): LIPASE, AMYLASE in the last 168 hours. No results for input(s): AMMONIA in the last 168 hours. CBC:  Recent Labs Lab 09/22/16 1838 09/24/16 1029 09/25/16 0500  WBC 12.3* 7.9 6.9  NEUTROABS 5.6  --   --   HGB 9.5* 9.0* 7.7*  HCT 26.6* 26.0* 22.4*  MCV 100.8* 102.4* 100.4*  PLT 169 149* 145*   Cardiac Enzymes:    Recent Labs Lab 09/23/16 0740 09/23/16 1415 09/23/16 1905  TROPONINI <0.03 <0.03 <0.03   BNP (last 3 results) No results for input(s): BNP in the last 8760 hours.  ProBNP (last 3 results) No results for input(s): PROBNP in the last 8760 hours.  CBG: No results for input(s): GLUCAP in the last 168 hours.  Recent Results (from the past 240 hour(s))  Culture, blood (x 2)     Status: None (Preliminary result)   Collection Time: 09/23/16  7:35 AM  Result Value Ref Range Status   Specimen Description BLOOD RIGHT ANTECUBITAL  Final   Special Requests   Final    BOTTLES DRAWN AEROBIC AND ANAEROBIC 10CC AER 5CC ANA   Culture NO GROWTH 1 DAY  Final   Report Status PENDING  Incomplete  Culture, blood (x 2)     Status: None (Preliminary result)   Collection Time: 09/23/16  7:40 AM  Result Value Ref Range Status   Specimen Description BLOOD LEFT HAND  Final   Special Requests BOTTLES DRAWN AEROBIC ONLY 10CC  Final   Culture NO GROWTH 1 DAY  Final   Report Status PENDING  Incomplete  MRSA PCR Screening     Status: None   Collection Time: 09/23/16 11:51 AM  Result Value Ref Range Status   MRSA by PCR NEGATIVE NEGATIVE Final    Comment:        The GeneXpert MRSA Assay (FDA approved for NASAL specimens only), is one component of a comprehensive MRSA colonization surveillance program. It is not intended to diagnose MRSA infection nor to guide or monitor treatment for MRSA infections.      Studies: No results found.  Scheduled Meds: . amLODipine  5 mg Oral Daily  . aspirin  81 mg Oral Daily  . calcitonin (salmon)  1 spray  Alternating Nares Daily  . ceFEPime (MAXIPIME) IV  2 g Intravenous Q0600  . cholecalciferol  1,000 Units Oral q morning - 10a  . cycloSPORINE  1 drop Both Eyes Daily  . enoxaparin (LOVENOX) injection  40 mg Subcutaneous Q24H  . ezetimibe  10 mg Oral Daily  . multivitamin with minerals  1 tablet Oral Daily  . oxyCODONE-acetaminophen  1 tablet Oral Q4H   And  . oxyCODONE  5 mg Oral Q4H  . polyethylene glycol  17 g Oral Daily  . risperiDONE  0.5 mg Oral BID  . senna-docusate  2 tablet Oral BID  .  sodium chloride flush  3 mL Intravenous Q12H  . vancomycin  1,000 mg Intravenous Q0600    Continuous Infusions:   Time spent: 62mns  Jaide Hillenburg MD, PhD  [facility] Pager [phone] If 7PM-7AM, please contact night-coverage at [email or web address], password [facility]2/06/2017, 11:13 AM  LOS: 0 days

## 2016-09-25 NOTE — Care Management Note (Signed)
Case Management Note  Patient Details  Name: Kristin [name] MRN: [id number] Date of Birth: Mar 11, 1931  Subjective/Objective:    83 y.o. F admitted 09/22/5016 for Intractable back pain, PT/OT recommending SNF and CSW has been consulted for placement.                 Action/Plan: CM will defer disposition to CSW but will continue to follow should disposition needs change.    Expected Discharge Date:                  Expected Discharge Plan:  Skilled Nursing Facility  In-House Referral:  Clinical Social Work  Discharge planning Services  CM Consult  Post Acute Care Choice:    Choice offered to:  Patient  DME Arranged:    DME Agency:     HH Arranged:    [place] Agency:     Status of Service:  Completed, signed off  If discussed at [employer], dates discussed:    Additional Comments:  Delrae Sawyers, RN 09/25/2016, 10:11 AM

## 2016-09-26 LAB — CBC
HEMATOCRIT: 25.1 % — AB (ref 36.0–46.0)
HEMOGLOBIN: 8.5 g/dL — AB (ref 12.0–15.0)
MCH: 34.3 pg — ABNORMAL HIGH (ref 26.0–34.0)
MCHC: 33.9 g/dL (ref 30.0–36.0)
MCV: 101.2 fL — ABNORMAL HIGH (ref 78.0–100.0)
Platelets: 160 10*3/uL (ref 150–400)
RBC: 2.48 MIL/uL — ABNORMAL LOW (ref 3.87–5.11)
RDW: 15.3 % (ref 11.5–15.5)
WBC: 8.5 10*3/uL (ref 4.0–10.5)

## 2016-09-26 LAB — BASIC METABOLIC PANEL
Anion gap: 6 (ref 5–15)
BUN: 21 mg/dL — ABNORMAL HIGH (ref 6–20)
CHLORIDE: 103 mmol/L (ref 101–111)
CO2: 28 mmol/L (ref 22–32)
CREATININE: 1.1 mg/dL — AB (ref 0.44–1.00)
Calcium: 11.9 mg/dL — ABNORMAL HIGH (ref 8.9–10.3)
GFR calc non Af Amer: 45 mL/min — ABNORMAL LOW (ref >60)
GFR, EST AFRICAN AMERICAN: 52 mL/min — AB (ref >60)
Glucose, Bld: 126 mg/dL — ABNORMAL HIGH (ref 65–99)
Potassium: 4.1 mmol/L (ref 3.5–5.1)
Sodium: 137 mmol/L (ref 135–145)

## 2016-09-26 LAB — RETICULOCYTES
RBC.: 2.48 MIL/uL — AB (ref 3.87–5.11)
Retic Count, Absolute: 47.1 10*3/uL (ref 19.0–186.0)
Retic Ct Pct: 1.9 % (ref 0.4–3.1)

## 2016-09-26 LAB — HEMOGLOBIN A1C
Hgb A1c MFr Bld: 6.3 % — ABNORMAL HIGH (ref 4.8–5.6)
Mean Plasma Glucose: 134 mg/dL

## 2016-09-26 LAB — IRON AND TIBC
IRON: 58 ug/dL (ref 28–170)
Saturation Ratios: 22 % (ref 10.4–31.8)
TIBC: 260 ug/dL (ref 250–450)
UIBC: 202 ug/dL

## 2016-09-26 LAB — VITAMIN B12: VITAMIN B 12: 608 pg/mL (ref 180–914)

## 2016-09-26 LAB — FOLATE: Folate: 47.4 ng/mL (ref >5.9)

## 2016-09-26 LAB — PROTIME-INR
INR: 0.95
Prothrombin Time: 12.7 seconds (ref 11.4–15.2)

## 2016-09-26 LAB — TYPE AND SCREEN
ABO/RH(D): O POS
Antibody Screen: NEGATIVE

## 2016-09-26 LAB — ABO/RH: ABO/RH(D): O POS

## 2016-09-26 LAB — C-REACTIVE PROTEIN: CRP: 1 mg/dL — AB (ref <1.0)

## 2016-09-26 LAB — SEDIMENTATION RATE: SED RATE: 50 mm/h — AB (ref 0–22)

## 2016-09-26 NOTE — Progress Notes (Signed)
CSW spoke with pt and pt dtr Lucita Ferrara at bedside.  Dtr Lucita Ferrara would like to take patient home with increased paid caregiver support at home- not interested in SNF at this time.  CSW updated RNCM- CSW signing off  Jorge Ny, LCSW Clinical Social Worker [phone]

## 2016-09-26 NOTE — Progress Notes (Signed)
Physical Therapy Treatment Patient Details Name: Kristin [name] MRN: [id number] DOB: July 03, 1931 Today's Date: September 29, 2016    History of Present Illness Pt adm with intractable back pain. MRI showed showed multilevel degenerative disc disease, but no spinal cord compression.  PMH - htn, osteoporosis, htn, vertigo, ckd., chronic back pain    PT Comments    Patient limited by pain and unable to come upright from R sidelying.  Feel she performs better with premedication as well as earlier in the day.  Was up in chair most of the day and reports used walker with nursing staff.  Will continue skilled PT during acute stay and recommend SNF rehab at d/c.  Follow Up Recommendations  SNF     Equipment Recommendations  None recommended by PT    Recommendations for Other Services       Precautions / Restrictions Precautions Precautions: Fall    Mobility  Bed Mobility Overal bed mobility: Needs Assistance Bed Mobility: Rolling;Sidelying to Sit Rolling: Mod assist Sidelying to sit: Max assist       General bed mobility comments: unable from R sidelying to come uprightdue to severe pain  Transfers                    Ambulation/Gait                 Stairs            Wheelchair Mobility    Modified Rankin (Stroke Patients Only)       Balance                                    Cognition Arousal/Alertness: Awake/alert Behavior During Therapy: WFL for tasks assessed/performed Overall Cognitive Status: No family/caregiver present to determine baseline cognitive functioning                      Exercises General Exercises - Lower Extremity Ankle Circles/Pumps: AROM;Both;10 reps;Supine Short Arc Quad: AROM;Both;10 reps;Supine Heel Slides: AROM;Both;5 reps;Supine Hip ABduction/ADduction: AROM;Both;5 reps;Supine    General Comments        Pertinent Vitals/Pain Pain Score: 10-Worst pain ever Pain Location: back R hip with  attempts at side to sit Pain Descriptors / Indicators: Other (Comment) (breaking) Pain Intervention(s): Limited activity within patient's tolerance;Monitored during session;Repositioned;Patient requesting pain meds-RN notified    Home Living                      Prior Function            PT Goals (current goals can now be found in the care plan section) Progress towards PT goals: Not progressing toward goals - comment (due to pain)    Frequency    Min 3X/week      PT Plan Current plan remains appropriate    Co-evaluation             End of Session   Activity Tolerance: Patient limited by pain Patient left: in bed;with call bell/phone within reach     Time: 8119-1478 PT Time Calculation (min) (ACUTE ONLY): 23 min  Charges:  $Therapeutic Exercise: 8-22 mins $Therapeutic Activity: 8-22 mins                    G Codes:      Reginia Naas 09/29/2016, 4:44 PM  Magda Kiel, [place] September 29, 2016

## 2016-09-26 NOTE — Progress Notes (Signed)
PROGRESS NOTE  Kristin [name] BVQ:[id number] DOB: 1931-03-23 DOA: 09/22/2016 PCP: [name], MD  HPI/Recap of past 24 hours:  Sitting in chair,  still has pain , not able to ambulate,  t max 98 She report she has loss of bowel and bladder control prior to coming to the hospital, it has improved, Daughter at bedside  Assessment/Plan: Principal Problem:   Intractable back pain Active Problems:   CKD (chronic kidney disease), stage III   HLD (hyperlipidemia)   HTN (hypertension)   Anemia   Sepsis ([place])   Back pain   Intractable back pain/unable to ambulate / with possible infection/sepsis presented on admission: MRI-Lumbar showed multilevel degenerative disc disease, but no spinal cord compression.  "Nonspecific smooth mild dural enhancement without collection, possibly related to recent intervention. Differential considerations include decreased CSF pressure, venous engorgement, or spinal patchymeningitis. Infection is possible but considered less likely." "Partially visualized is edema and mild enhancement of the sacrum and surrounding soft tissues greatest in the right sacral ala, suspected sacral insufficiency fracture" --PT/OT/-pain control: prn percocet and IV morphine Clinically improving on iv abx, but still not able to ambulate, I have discussed with neurosurgery Dr Annette Stable who recommend repeat MRI spine, may need formal consult pending repeat MRI   Sepsis:  pt meets criteria for sepsis with leukocytosis, fever, tachycardia and tachypnea. procaltitonin 0.4 on admission  Urinalysis negative. Patient does not have any respiratory symptoms. cxr no infiltrate,  Pt had recent steroid injection and MRI showed nonspecific smooth mild dural enhancement without collection, she has increasing back pain after the steroid injection, not able to ambulate -Blood culture in process - Abx empirically started on admission with vanc and cefepime, fever subsiding, back pain  improved, leukocytosis resolved. Tachycardia resolved, - Continue abx for now awaiting blood culture result,  repeat MRI spine pending ( patient could have an early presentation of spine infection)   AKI on CKD (chronic kidney disease), stage III:  ua unremarkable Cr 1,24 on admission, cr 0.99 the next morning. -Follow up renal function by  BMP  HLD: Last LDL was 100 on 11/26/13 -Continue home medications: Zocor and Zetia -Check FLP  HTN: -continue Hyzarr  Anemia: Hgb 9.5 which was 10.0 on 06/20/16 -acute drop of hgb 7.7, anemia work up Repeat cbc next day, hgb 8.5  Mild hypercalcemia: Calcium 10.8, likely due to dehydration. -normalized with hydration  Body mass index is 31.93 kg/m.   DVT ppx: SQ Lovenox Code Status: Full code Family Communication:  Yes, patient's daughter at bed side Disposition Plan: SNF vs home health, patient is not able to ambulate by herself safely   Consultants:  Phone conversation with neurosurgery Dr Annette Stable  Procedures:  MRI lumbar spine on admission  Repeat mri spine on 2/12  Antibiotics:  vanc/cefepime   Objective: BP (!) 143/54 (BP Location: Right Arm)   Pulse 98   Temp 99.5 F (37.5 C) (Oral)   Resp 18   Ht 4' 11"  (1.499 m)   Wt 71.7 kg (158 lb 1.1 oz)   SpO2 94%   BMI 31.93 kg/m   Intake/Output Summary (Last 24 hours) at 09/26/16 1138 Last data filed at 09/26/16 0617  Gross per 24 hour  Intake              840 ml  Output                0 ml  Net  840 ml   Filed Weights   09/23/16 0600  Weight: 71.7 kg (158 lb 1.1 oz)    Exam:   General:  Frail,  NAD  Cardiovascular: RRR  Respiratory: CTABL  Abdomen: Soft/ND/NT, positive BS  Musculoskeletal: No Edema  Neuro: aaox3  Data Reviewed: Basic Metabolic Panel:  Recent Labs Lab 09/22/16 1838 09/24/16 1029 09/25/16 0500  NA 143 143 140  K 3.7 4.0 3.6  CL 103 105 103  CO2 30 26 27   GLUCOSE 177* 174* 110*  BUN 42* 22* 21*    CREATININE 1.24* 0.99 1.15*  CALCIUM 10.8* 10.3 10.3  MG  --   --  1.7   Liver Function Tests:  Recent Labs Lab 09/25/16 0500  AST 17  ALT 11*  ALKPHOS 87  BILITOT 0.6  PROT 4.8*  ALBUMIN 2.7*   No results for input(s): LIPASE, AMYLASE in the last 168 hours. No results for input(s): AMMONIA in the last 168 hours. CBC:  Recent Labs Lab 09/22/16 1838 09/24/16 1029 09/25/16 0500  WBC 12.3* 7.9 6.9  NEUTROABS 5.6  --   --   HGB 9.5* 9.0* 7.7*  HCT 26.6* 26.0* 22.4*  MCV 100.8* 102.4* 100.4*  PLT 169 149* 145*   Cardiac Enzymes:    Recent Labs Lab 09/23/16 0740 09/23/16 1415 09/23/16 1905  TROPONINI <0.03 <0.03 <0.03   BNP (last 3 results) No results for input(s): BNP in the last 8760 hours.  ProBNP (last 3 results) No results for input(s): PROBNP in the last 8760 hours.  CBG: No results for input(s): GLUCAP in the last 168 hours.  Recent Results (from the past 240 hour(s))  Culture, blood (x 2)     Status: None (Preliminary result)   Collection Time: 09/23/16  7:35 AM  Result Value Ref Range Status   Specimen Description BLOOD RIGHT ANTECUBITAL  Final   Special Requests   Final    BOTTLES DRAWN AEROBIC AND ANAEROBIC 10CC AER 5CC ANA   Culture NO GROWTH 3 DAYS  Final   Report Status PENDING  Incomplete  Culture, blood (x 2)     Status: None (Preliminary result)   Collection Time: 09/23/16  7:40 AM  Result Value Ref Range Status   Specimen Description BLOOD LEFT HAND  Final   Special Requests BOTTLES DRAWN AEROBIC ONLY 10CC  Final   Culture NO GROWTH 3 DAYS  Final   Report Status PENDING  Incomplete  MRSA PCR Screening     Status: None   Collection Time: 09/23/16 11:51 AM  Result Value Ref Range Status   MRSA by PCR NEGATIVE NEGATIVE Final    Comment:        The GeneXpert MRSA Assay (FDA approved for NASAL specimens only), is one component of a comprehensive MRSA colonization surveillance program. It is not intended to diagnose MRSA infection  nor to guide or monitor treatment for MRSA infections.      Studies: No results found.  Scheduled Meds: . amLODipine  5 mg Oral Daily  . aspirin  81 mg Oral Daily  . calcitonin (salmon)  1 spray Alternating Nares Daily  . ceFEPime (MAXIPIME) IV  2 g Intravenous Q0600  . cholecalciferol  1,000 Units Oral q morning - 10a  . cycloSPORINE  1 drop Both Eyes Daily  . enoxaparin (LOVENOX) injection  40 mg Subcutaneous Q24H  . ezetimibe  10 mg Oral Daily  . multivitamin with minerals  1 tablet Oral Daily  . oxyCODONE-acetaminophen  1 tablet Oral Q4H  And  . oxyCODONE  5 mg Oral Q4H  . polyethylene glycol  17 g Oral Daily  . risperiDONE  0.5 mg Oral BID  . senna-docusate  2 tablet Oral BID  . sodium chloride flush  3 mL Intravenous Q12H  . vancomycin  1,000 mg Intravenous Q0600    Continuous Infusions:   Time spent: 61mns  Kaliah Haddaway MD, PhD  [facility] Pager [phone] If 7PM-7AM, please contact night-coverage at [email or web address], password [email or web address]2/07/2017, 11:38 AM  LOS: 1 day

## 2016-09-26 NOTE — NC FL2 (Signed)
[place] MEDICAID FL2 LEVEL OF CARE SCREENING TOOL     IDENTIFICATION  Patient Name: Kristin [name] Birthdate: 29-Jul-1931 Sex: female Admission Date (Current Location): 09/22/2016  [facility] and [place] Number:  Herbalist and Address:  [facility] [place], [place] [address] [place], [place] [address]      Provider Number: [id number]  Attending Physician Name and Address:  Florencia Reasons, MD  Relative Name and Phone Number:       Current Level of Care: Hospital Recommended Level of Care: [place] Prior Approval Number:    Date Approved/Denied:   PASRR Number: [id number]  Discharge Plan: SNF    Current Diagnoses: Patient Active Problem List   Diagnosis Date Noted  . Anemia 09/23/2016  . Sepsis ([place]) 09/23/2016  . Back pain 09/23/2016  . Intractable back pain 06/20/2016  . Other malaise and fatigue 11/26/2013  . Vertigo   . Obesity   . Arthritis of knee, left 07/09/2013  . Need for prophylactic vaccination and inoculation against influenza 07/09/2013  . CKD (chronic kidney disease), stage III 01/01/2013  . Osteoporosis with pathological fracture 01/01/2013  . HLD (hyperlipidemia) 01/01/2013  . HTN (hypertension) 01/01/2013  . Chronic back pain 01/01/2013  . Vertebral compression fracture ([place]) 01/01/2013  . Seasonal allergic rhinitis 01/01/2013    Orientation RESPIRATION BLADDER Height & Weight     Self, Time, Situation, Place  Normal Incontinent Weight: 158 lb 1.1 oz (71.7 kg) Height:  4' 11"  (149.9 cm)  BEHAVIORAL SYMPTOMS/MOOD NEUROLOGICAL BOWEL NUTRITION STATUS      Incontinent    AMBULATORY STATUS COMMUNICATION OF NEEDS Skin   Extensive Assist Verbally Normal                       Personal Care Assistance Level of Assistance  Bathing, Dressing Bathing Assistance: Maximum assistance   Dressing Assistance: Maximum assistance     Functional Limitations Info             SPECIAL CARE FACTORS FREQUENCY  PT  (By licensed PT), OT (By licensed OT)     PT Frequency: 5/wk OT Frequency: 5/wk            Contractures Contractures Info: Present    Additional Factors Info  Code Status, Allergies Code Status Info: FULL             Current Medications (09/26/2016):  This is the current hospital active medication list Current Facility-Administered Medications  Medication Dose Route Frequency Provider Last Rate Last Dose  . acetaminophen (TYLENOL) tablet 650 mg  650 mg Oral Q6H PRN Ivor Costa, MD       Or  . acetaminophen (TYLENOL) suppository 650 mg  650 mg Rectal Q6H PRN Ivor Costa, MD      . amLODipine (NORVASC) tablet 5 mg  5 mg Oral Daily Ivor Costa, MD   5 mg at 09/26/16 0912  . aspirin chewable tablet 81 mg  81 mg Oral Daily Ivor Costa, MD   81 mg at 09/26/16 0913  . calcitonin (salmon) (MIACALCIN/FORTICAL) nasal spray 1 spray  1 spray Alternating Nares Daily Ivor Costa, MD   1 spray at 09/26/16 1036  . ceFEPIme (MAXIPIME) 2 g in dextrose 5 % 50 mL IVPB  2 g Intravenous Q0600 Franky Macho, RPH 100 mL/hr at 09/26/16 0611 2 g at 09/26/16 9532  . cholecalciferol (VITAMIN D) tablet 1,000 Units  1,000 Units Oral q morning - 10a Ivor Costa, MD  1,000 Units at 09/26/16 0912  . cycloSPORINE (RESTASIS) 0.05 % ophthalmic emulsion 1 drop  1 drop Both Eyes Daily Ivor Costa, MD   1 drop at 09/26/16 0917  . enoxaparin (LOVENOX) injection 40 mg  40 mg Subcutaneous Q24H Florencia Reasons, MD   40 mg at 09/25/16 1701  . ezetimibe (ZETIA) tablet 10 mg  10 mg Oral Daily Ivor Costa, MD   10 mg at 09/26/16 0914  . gadobenate dimeglumine (MULTIHANCE) injection 10 mL  10 mL Intravenous Once PRN Ripley Fraise, MD      . magnesium hydroxide (MILK OF MAGNESIA) suspension 30 mL  30 mL Oral Daily PRN Ivor Costa, MD      . meclizine (ANTIVERT) tablet 25 mg  25 mg Oral TID PRN Ivor Costa, MD      . morphine 4 MG/ML injection 1 mg  1 mg Intramuscular Q2H PRN Ivor Costa, MD      . multivitamin with minerals tablet 1 tablet  1 tablet  Oral Daily Ivor Costa, MD   1 tablet at 09/26/16 0911  . ondansetron (ZOFRAN) tablet 4 mg  4 mg Oral Q6H PRN Ivor Costa, MD       Or  . ondansetron [facility]) injection 4 mg  4 mg Intravenous Q6H PRN Ivor Costa, MD      . oxyCODONE-acetaminophen (PERCOCET/ROXICET) 5-325 MG per tablet 1 tablet  1 tablet Oral Q4H Ivor Costa, MD   1 tablet at 09/26/16 0912   And  . oxyCODONE (Oxy IR/ROXICODONE) immediate release tablet 5 mg  5 mg Oral Q4H Ivor Costa, MD   5 mg at 09/26/16 0912  . polyethylene glycol (MIRALAX / GLYCOLAX) packet 17 g  17 g Oral Daily Ivor Costa, MD   17 g at 09/26/16 0915  . risperiDONE (RISPERDAL) tablet 0.5 mg  0.5 mg Oral BID Ivor Costa, MD   0.5 mg at 09/26/16 0913  . senna-docusate (Senokot-S) tablet 2 tablet  2 tablet Oral BID Florencia Reasons, MD   2 tablet at 09/26/16 0916  . sodium chloride flush (NS) 0.9 % injection 3 mL  3 mL Intravenous Q12H Ivor Costa, MD   3 mL at 09/25/16 2109  . vancomycin (VANCOCIN) IVPB 1000 mg/200 mL premix  1,000 mg Intravenous Q0600 Franky Macho, RPH   1,000 mg at 09/26/16 0617  . zolpidem (AMBIEN) tablet 5 mg  5 mg Oral QHS PRN Ivor Costa, MD         Discharge Medications: Please see discharge summary for a list of discharge medications.  Relevant Imaging Results:  Relevant Lab Results:   Additional Information ss # [id number]  Jorge Ny, LCSW

## 2016-09-27 ENCOUNTER — Inpatient Hospital Stay (HOSPITAL_COMMUNITY): Payer: Medicare Other

## 2016-09-27 LAB — BASIC METABOLIC PANEL
ANION GAP: 8 (ref 5–15)
BUN: 18 mg/dL (ref 6–20)
CO2: 27 mmol/L (ref 22–32)
Calcium: 12.1 mg/dL — ABNORMAL HIGH (ref 8.9–10.3)
Chloride: 101 mmol/L (ref 101–111)
Creatinine, Ser: 0.94 mg/dL (ref 0.44–1.00)
GFR calc Af Amer: >60 mL/min (ref >60)
GFR, EST NON AFRICAN AMERICAN: 54 mL/min — AB (ref >60)
GLUCOSE: 125 mg/dL — AB (ref 65–99)
POTASSIUM: 3.6 mmol/L (ref 3.5–5.1)
SODIUM: 136 mmol/L (ref 135–145)

## 2016-09-27 LAB — VANCOMYCIN, TROUGH: Vancomycin Tr: 16 ug/mL (ref 15–20)

## 2016-09-27 LAB — OCCULT BLOOD X 1 CARD TO LAB, STOOL: FECAL OCCULT BLD: NEGATIVE

## 2016-09-27 LAB — CBC
HCT: 23.4 % — ABNORMAL LOW (ref 36.0–46.0)
Hemoglobin: 8 g/dL — ABNORMAL LOW (ref 12.0–15.0)
MCH: 34.2 pg — ABNORMAL HIGH (ref 26.0–34.0)
MCHC: 34.2 g/dL (ref 30.0–36.0)
MCV: 100 fL (ref 78.0–100.0)
PLATELETS: 154 10*3/uL (ref 150–400)
RBC: 2.34 MIL/uL — AB (ref 3.87–5.11)
RDW: 15.2 % (ref 11.5–15.5)
WBC: 7 10*3/uL (ref 4.0–10.5)

## 2016-09-27 MED ORDER — OXYCODONE HCL 5 MG PO TABS: 5.0000 mg | ORAL_TABLET | ORAL | 0 refills | Status: DC

## 2016-09-27 MED ORDER — OXYCODONE-ACETAMINOPHEN 5-325 MG PO TABS: 1.0000 | ORAL_TABLET | ORAL | 0 refills | Status: DC

## 2016-09-27 MED ORDER — GADOBENATE DIMEGLUMINE 529 MG/ML IV SOLN
15.0000 mL | Freq: Once | INTRAVENOUS | Status: AC
  Administered 2016-09-27: 14 mL via INTRAVENOUS

## 2016-09-27 NOTE — Progress Notes (Signed)
Pt prepared for d/c to SNF. IV d/c'd. Skin intact except as charted in most recent assessments. Vitals are stable. Report called to receiving facility. Pt to be transported by ambulance service.

## 2016-09-27 NOTE — Clinical Social Work Note (Signed)
Clinical Social Work Assessment  Patient Details  Name: Kristin [name] MRN: [id number] Date of Birth: July 02, 1931  Date of referral:  09/27/16               Reason for consult:  Facility Placement                Permission sought to share information with:  Chartered certified accountant granted to share information::  Yes, Verbal Permission Granted  Name::     Vincente Liberty  Agency::  SNF  Relationship::  dtrs  Contact Information:     Housing/Transportation Living arrangements for the past 2 months:  [place] of Information:  Patient, Adult Children Patient Interpreter Needed:  None Criminal Activity/Legal Involvement Pertinent to Current Situation/Hospitalization:  No - Comment as needed Significant Relationships:  Adult Children Lives with:  Self Do you feel safe going back to the place where you live?  No Need for family participation in patient care:  No (Coment)  Care giving concerns:  Pt lives at home alone but has available help from dtrs- dtrs do not think they could care for patient with current level of impairment    Facilities manager / plan:  CSW spoke with dtrs concerning plan for SNF vs home health.  Dtrs are familiar with SNF after pt previous stay for rehab a few months prior.  Employment status:  Retired Nurse, adult PT Recommendations:  [place] / Referral to community resources:  [place]  Patient/Family's Response to care:  Pt was resistant to going to SNF and preferred home originally but after further discussion with dtrs she is willing to go to SNF.  Patient/Family's Understanding of and Emotional Response to Diagnosis, Current Treatment, and Prognosis:  No questions or concerns at this time- hopeful for short rehab stay.  Emotional Assessment Appearance:  Appears stated age Attitude/Demeanor/Rapport:    Affect (typically observed):   Appropriate Orientation:  Oriented to Self, Oriented to Place, Oriented to  Time, Oriented to Situation Alcohol / Substance use:  Not Applicable Psych involvement (Current and /or in the community):  No (Comment)  Discharge Needs  Concerns to be addressed:  Care Coordination Readmission within the last 30 days:  No Current discharge risk:  Physical Impairment Barriers to Discharge:  Continued Medical Work up   Jorge Ny, LCSW 09/27/2016, 1:08 PM

## 2016-09-27 NOTE — Care Management Note (Signed)
Case Management Note  Patient Details  Name: Kristin [name] MRN: [id number] Date of Birth: [birth year]  Subjective/Objective:             Admitted with sepsis.  ? spinal infection repeat MRI today.        Action/Plan: Plan is to discharge to SNF today pending results of  repeated MRI.  Expected Discharge Date:    09/27/2016           Expected Discharge Plan:  [place]  In-House Referral:  Clinical Social Work  Discharge planning Services  CM Consult   Status of Service:  Completed, signed off  If discussed at [employer] of Stay Meetings, dates discussed:    Additional Comments:  Sharin Mons, RN 09/27/2016, 11:24 AM

## 2016-09-27 NOTE — Discharge Summary (Addendum)
Discharge Summary  Kristin [name] HBZ:[id number] DOB: 11/20/1930  PCP: [name], MD  Admit date: 09/22/2016 Discharge date: 09/27/2016  Time spent: >59mns, from 1pm to 1:35pm  Recommendations for Outpatient Follow-up:  1. F/u with SNF MD  for hospital discharge follow up, repeat cbc/bmp at follow up, please monitor calcium level 2. F/u with neurosurgery Dr PAnnette Stableif back pain persists   Discharge Diagnoses:  Active Hospital Problems   Diagnosis Date Noted  . Intractable back pain 06/20/2016  . Anemia 09/23/2016  . Sepsis ([place] 09/23/2016  . Back pain 09/23/2016  . CKD (chronic kidney disease), stage III 01/01/2013  . HLD (hyperlipidemia) 01/01/2013  . HTN (hypertension) 01/01/2013    Resolved Hospital Problems   Diagnosis Date Noted Date Resolved  No resolved problems to display.    Discharge Condition: stable  Diet recommendation: heart healthy  Filed Weights   09/23/16 0600  Weight: 71.7 kg (158 lb 1.1 oz)    History of present illness:  PCP: CEvelina Dun FNP   Patient coming from:  The patient is coming from home.  At baseline, pt is partially dependent for most of ADL.   Chief Complaint: Intractable back pain  HPI: Kristin BOYUMis a 83y.o. female with medical history significant of chronic back pain, hypertension, hyperlipidemia, vertigo, anemia, chronic kidney disease-stage III, who presents with intractable back pain.  Patient states he has chronic lower back pain, which has worsened since yesterday. She had urinary incontinence at home. Her back pain is constant, 10 out of 10 in severity, sharp, radiating to the right leg. It is associated with left leg weakness. Patient states that she had steroid injection 10 days ago without any help. Taking 10-325 oxycodone with no significant relief. She cannot walk normally. She states that she had mild fever yesterday. Patient does not have any respiratory symptoms. Denies symptoms of UTI. Patient  does not have vision change or hearing loss.  ED Course: pt was found to have WBC 12.3, temperature 100.3, tachycardia, tachypnea, stable renal function, negative urinalysis. Patient is placed on telemetry bed for observation.  # MRI-Lumbar showed multilevel degenerative disc disease, but no spinal cord compression. Also showed nonspecific smooth mild dural enhancement without collection. Per radiologist, possibly related to recent intervention. Differential considerations include decreased CSF pressure, venous engorgement, or spinal patchymeningitis. Infection is possible but considered less likely.   Hospital Course:  Principal Problem:   Intractable back pain Active Problems:   CKD (chronic kidney disease), stage III   HLD (hyperlipidemia)   HTN (hypertension)   Anemia   Sepsis (HCC)   Back pain   Intractable back pain/unable to ambulate / with possible infection/sepsis presented on admission: MRI-Lumbar showed multilevel degenerative disc disease, but no spinal cord compression.  "Nonspecific smooth mild dural enhancement without collection, possibly related to recent intervention. Differential considerations include decreased CSF pressure, venous engorgement, or spinal patchymeningitis. Infection is possible but considered less likely." "Partially visualized is edema and mild enhancement of the sacrum and surrounding soft tissues greatest in the right sacral ala, suspected sacral insufficiency fracture" --Clinically improving on iv abx, but still not able to ambulate, I have discussed with neurosurgery Dr PAnnette Stable On 2/12who recommend repeat MRI spine  -I have discussed the repeat mri result with Dr pool on 2/13, per Dr PAnnette Stable mri changes likely related to recent steroids injection, patient can be discharged to snf , no need of further abx,( she received total of 5 days iv vanc and cefepime in  the hospital),  no need of surgical intervention.  Sepsis: pt meets criteria for sepsis with  leukocytosis, fever, tachycardia and tachypnea. procaltitonin 0.4 on admission  Urinalysis negative. Patient does not have any respiratory symptoms. cxr no infiltrate,  Pt had recent steroid injection and MRI showed nonspecific smooth mild dural enhancement without collection, she has increasing back pain after the steroid injection, not able to ambulate -Blood culture  No growth - Abx empirically started on admission with vanc and cefepime, fever subsiding, back pain improved, leukocytosis resolved. Tachycardia resolved, -  MRI spine no significant interval changes, I have discussed with neurosurgery Dr Annette Stable who recommend patient can discharge to snf. No need of further abx, mri changes likely related to recent steroid injection.   AKI on CKD (chronic kidney disease), stage III:  ua unremarkable Cr 1,24 on admission, cr 0.94 at discharge with gentle hydration.   UMP:NTIR LDL was 100 on 11/26/13 -Continue home medications: Zocor and Zetia -ldl 100  HTN: -continue Hyzarr  Anemia: Hgb 9.5 which was 10.0 on 06/20/16 -acute drop of hgb 7.7, lab error?  -Repeat cbc next day, hgb 8.5 -no sign of bleeding, b12/folate/iron panel unremarkable, retic count inappropriately low, likely anemia of chronic disease. pmd to continue monitor hgb.   hypercalcemia:from dehydration? She is also on calcitonin nasal spry and vitamin D supplement at home -she received hydration -D/c calcitonin nasal spry, d/c vitamin D supplement -pmd to repeat calcium level, consider endocrinology referral if ca remain elevated   Body mass index is 31.93 kg/m.   DVT ppx  While in the hospital: SQ Lovenox Code Status:Full code Family Communication: Yes, patient'sdaughterat bed side Disposition Plan: SNF   Consultants:  Phone conversation with neurosurgery Dr Annette Stable on 2/12 and 2/13  Procedures:  MRI lumbar spine on admission  Repeat mri spine on  2/12  Antibiotics:  vanc/cefepime    Discharge Exam: BP (!) 161/80 (BP Location: Left Arm)   Pulse (!) 107   Temp 99.8 F (37.7 C) (Oral)   Resp 18   Ht 4' 11"  (1.499 m)   Wt 71.7 kg (158 lb 1.1 oz)   SpO2 96%   BMI 31.93 kg/m     General:  Frail,  NAD  Cardiovascular: RRR  Respiratory: CTABL  Abdomen: Soft/ND/NT, positive BS  Musculoskeletal: No Edema  Neuro: aaox3   Discharge Instructions You were cared for by a hospitalist during your hospital stay. If you have any questions about your discharge medications or the care you received while you were in the hospital after you are discharged, you can call the unit and asked to speak with the hospitalist on call if the hospitalist that took care of you is not available. Once you are discharged, your primary care physician will handle any further medical issues. Please note that NO REFILLS for any discharge medications will be authorized once you are discharged, as it is imperative that you return to your primary care physician (or establish a relationship with a primary care physician if you do not have one) for your aftercare needs so that they can reassess your need for medications and monitor your lab values.  Discharge Instructions    Diet - low sodium heart healthy    Complete by:  As directed    Increase activity slowly    Complete by:  As directed      Allergies as of 09/27/2016      Reactions   Alendronate Nausea Only, Other (See Comments)   Hip pain  Penicillins Rash   Has patient had a PCN reaction causing immediate rash, facial/tongue/throat swelling, SOB or lightheadedness with hypotension: no Has patient had a PCN reaction causing severe rash involving mucus membranes or skin necrosis: no Has patient had a PCN reaction that required hospitalization no Has patient had a PCN reaction occurring within the last 10 years:no If all of the above answers are "NO", then may proceed with Cephalosporin use.    Sulfonamide Derivatives Itching, Rash      Medication List    STOP taking these medications   calcitonin (salmon) 200 UNIT/ACT nasal spray Commonly known as:  MIACALCIN/FORTICAL   magnesium hydroxide 400 MG/5ML suspension Commonly known as:  MILK OF MAGNESIA   oxyCODONE 5 MG immediate release tablet Commonly known as:  Oxy IR/ROXICODONE   oxyCODONE-acetaminophen 10-325 MG tablet Commonly known as:  PERCOCET Replaced by:  oxyCODONE-acetaminophen 5-325 MG tablet   VITAMIN D PO     TAKE these medications   amLODipine 5 MG tablet Commonly known as:  NORVASC Take 1 tablet (5 mg total) by mouth daily.   aspirin 81 MG tablet Take 81 mg by mouth daily.   bisacodyl 10 MG suppository Commonly known as:  DULCOLAX Place 1 suppository (10 mg total) rectally daily as needed for moderate constipation.   CENTRUM SILVER PO Take 1 tablet by mouth daily.   ezetimibe 10 MG tablet Commonly known as:  ZETIA Take 1 tablet (10 mg total) by mouth daily.   ibandronate 150 MG tablet Commonly known as:  BONIVA Take 1 tablet (150 mg total) by mouth every 30 (thirty) days. Take in the morning with a full glass of water, on an empty stomach, and do not take anything else by mouth or lie down for the next 30 min.   losartan-hydrochlorothiazide 50-12.5 MG tablet Commonly known as:  HYZAAR Take 1 tablet by mouth daily.   meclizine 25 MG tablet Commonly known as:  ANTIVERT TAKE ONE TABLET BY MOUTH THREE TIMES DAILY AS NEEDED What changed:  how much to take  how to take this  when to take this  reasons to take this  additional instructions   oxyCODONE-acetaminophen 5-325 MG tablet Commonly known as:  PERCOCET/ROXICET Take 1 tablet by mouth every 4 (four) hours. Replaces:  oxyCODONE-acetaminophen 10-325 MG tablet   polyethylene glycol powder powder Commonly known as:  GLYCOLAX/MIRALAX Take 17 g by mouth daily.   RESTASIS 0.05 % ophthalmic emulsion Generic drug:   cycloSPORINE Place 1 drop into both eyes daily.   risperiDONE 0.5 MG tablet Commonly known as:  RISPERDAL Take 1 tablet (0.5 mg total) by mouth 2 (two) times daily.      Allergies  Allergen Reactions  . Alendronate Nausea Only and Other (See Comments)    Hip pain  . Penicillins Rash    Has patient had a PCN reaction causing immediate rash, facial/tongue/throat swelling, SOB or lightheadedness with hypotension: no Has patient had a PCN reaction causing severe rash involving mucus membranes or skin necrosis: no Has patient had a PCN reaction that required hospitalization no Has patient had a PCN reaction occurring within the last 10 years:no If all of the above answers are "NO", then may proceed with Cephalosporin use.    . Sulfonamide Derivatives Itching and Rash    Contact information for follow-up providers    POOL,HENRY A, MD Follow up in 3 week(s).   Specialty:  Neurosurgery Why:  back pain Contact information: [address] [place] [address] [place] [address]  [place], MD Follow up in 2 week(s).   Specialty:  Family Medicine Why:  pmd to continue follow up on blood calcium level. Contact information: [place] [address] [phone]            Contact information for after-discharge care    Destination    [facility] .   Specialty:  [place] information: [address] [place] [phone]                   The results of significant diagnostics from this hospitalization (including imaging, microbiology, ancillary and laboratory) are listed below for reference.    Significant Diagnostic Studies: Dg Chest 1 View  Result Date: 09/23/2016 CLINICAL DATA:  83 year old female with sepsis. Initial encounter. EXAM: CHEST 1 VIEW COMPARISON:  Chest radiographs 04/25/2016 and earlier. FINDINGS: AP supine view of the chest at  0750 hours. Stable cardiomegaly and mediastinal contours. Tortuous thoracic aorta. Calcified aortic atherosclerosis. Allowing for portable technique the lungs are clear. T9 kyphoplasty or vertebroplasty sequelae again noted. IMPRESSION: No acute cardiopulmonary abnormality. Cardiomegaly and calcified aortic atherosclerosis. Electronically Signed   By: Genevie Ann M.D.   On: 09/23/2016 08:35   Dg Lumbar Spine Complete  Result Date: 09/22/2016 CLINICAL DATA:  Patient with worsening lumbar spine pain. EXAM: LUMBAR SPINE - COMPLETE 4+ VIEW COMPARISON:  CT lumbar spine 06/20/2016 ; lumbar spine radiograph 06/18/2016. FINDINGS: Multilevel degenerative disc disease. Age-indeterminate height loss of L3 vertebral body. Grade 2 anterolisthesis of L5 on S1. Lower lumbar spine facet degenerative changes. The T11 vertebral body is largely obscured and not well visualized. Cholecystectomy clips. SI joints are unremarkable. IMPRESSION: Age-indeterminate height loss of the L3 vertebral body. Recommend correlation for point tenderness. Grade 2 anterolisthesis L5-S1. Electronically Signed   By: Lovey Newcomer M.D.   On: 09/22/2016 19:12   Dg Abd 1 View  Result Date: 09/23/2016 CLINICAL DATA:  83 year old female with abdominal pain for several days. Initial encounter. EXAM: ABDOMEN - 1 VIEW COMPARISON:  Lumbar radiographs 09/22/2016 and earlier. FINDINGS: Supine view of the abdomen at 0750 hours. Compare to lumbar radiographs in 2017 the bowel gas pattern appears stable and nonobstructed. Stable cholecystectomy clips. Aortoiliac calcified atherosclerosis noted. Stable visualized osseous structures. Grossly negative lung bases. No definite pneumoperitoneum on this supine view. IMPRESSION: Normal bowel gas pattern. Calcified aortic atherosclerosis. Electronically Signed   By: Genevie Ann M.D.   On: 09/23/2016 08:34   Mr Lumbar Spine W Wo Contrast  Result Date: 09/27/2016 CLINICAL DATA:  Low back pain. Weakness with inability to ambulate.  Loss of bowel and bladder control. Recent spinal steroid injection. EXAM: MRI LUMBAR SPINE WITHOUT AND WITH CONTRAST TECHNIQUE: Multiplanar and multiecho pulse sequences of the lumbar spine were obtained without and with intravenous contrast. CONTRAST:  80m MULTIHANCE GADOBENATE DIMEGLUMINE 529 MG/ML IV SOLN COMPARISON:  09/23/2016 FINDINGS: Segmentation: The lowest fully formed intervertebral disc space is designated L5-S1 as on the recent prior MRI. Alignment: Unchanged alignment with slight retrolisthesis of L1 on L2 and L2 on L3 an 9 mm anterolisthesis of L5 on S1. Mild lumbar dextroscoliosis with apex at L2. Vertebrae: Chronic T10 compression fracture status post augmentation. Chronic L3 compression fracture without marrow edema. Edema and enhancement are again partially visualized in the right greater than left sacral ala and extending anteriorly across the S2 segment, similar to the recent prior examination. Conus medullaris: Extends to the  L2 level and appears normal. Similar appearance of mild dural enhancement extending from L3 into the sacrum. The nerve roots of the cauda equina are displaced predominantly centrally within the thecal sac in a configuration compatible with an underlying subdural fluid collection. This is overall similar in appearance to the recent prior examination, though with slightly increased prominence of the collection at L2-3 and stable to slightly decreased prominence of the collection more inferiorly which may reflect some redistribution. The collection extends inferiorly into the sacral canal, likely extends as far cephalad as the L1-2 level, and is new from an 04/14/2011 MRI. No epidural or paraspinal fluid collection is identified. 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 Paraspinal and other soft tissues: Partially visualized bilateral renal cysts, left larger than right. Similar appearance of extrahepatic biliary dilatation with the common bile duct measuring approximately 13 mm in  diameter. Disc levels: Advanced lumbar disc and facet degeneration is unchanged from the recent prior MRI, worst at L4-5 and L5-S1 where there is moderate spinal stenosis and moderate to severe neural foraminal stenosis as previously described. IMPRESSION: 1. Subdural fluid collection in the lumbar and sacral spinal canal, potentially related to the patient's recent spinal injection. An infected collection is possible, however there is only mild dural enhancement which has not progressed from the recent prior MRI. 2. Similar appearance of marrow edema in the sacrum suggesting insufficiency fractures. 3. Advanced disc and facet degeneration as described on the recent prior MRI. Electronically Signed   By: Logan Bores M.D.   On: 09/27/2016 12:24   Mr Lumbar Spine W Wo Contrast  Result Date: 09/23/2016 CLINICAL DATA:  83 y/o F; 1 week history of worsening lower back pain, intermittent tingling in the right leg, loss of control with urination, and numbness sensation around her anus. Recent cortisone injection. EXAM: MRI LUMBAR SPINE WITHOUT AND WITH CONTRAST TECHNIQUE: Multiplanar and multiecho pulse sequences of the lumbar spine were obtained without and with intravenous contrast. CONTRAST:  10 cc MultiHance COMPARISON:  04/14/2011 lumbar MRI. 06/20/2016 lumbar CT. 09/22/2016 lumbar radiographs appear FINDINGS: Segmentation:  Standard. Alignment: Stable grade 2 L5-S1 anterolisthesis. Mild dextrocurvature with apex at L2 and levocurvature with apex at L5. Vertebrae: There is a moderate anterior compression deformity of the L3 vertebral body superior endplate unchanged in comparison with the prior radiographs without enhancement or edema indicating chronic etiology. Multiple levels of Modic 3 endplate degenerative changes and small Schmorl's nodes. Edema within the right T12 pedicle without appreciable fracture is likely a stress reaction. Multiple small facet effusions most pronounced in the lower thoracic spine and  within the left L4-5 facet. Partially visualized is edema and on mild enhancement within the sacrum greatest within the right sacral alum possibly representing insufficiency fracture. Conus medullaris: Extends to the lower L2 level and appears normal. S3 Tarlov cysts. Diffuse mild smooth dural enhancement without collection. Paraspinal and other soft tissues: Multiple nonenhancing renal cysts. Large common bile duct measuring 12 mm similar to prior lumbar MRI. Disc levels: T12-L1: Small disc bulge with bilateral facet hypertrophy resulting in mild left-greater-than-right foraminal narrowing. No significant canal stenosis. L1-2: Small disc bulge eccentric to the right with mild facet and ligamentum flavum hypertrophy. Mild right foraminal narrowing. No significant canal stenosis. L2-3: Moderate disc bulge with facet and ligamentum flavum hypertrophy. Moderate bilateral foraminal narrowing. Partial effacement of the lateral recesses. No significant canal stenosis. L3-4: Moderate disc bulge with small central protrusion combined with bilateral facet and ligamentum flavum hypertrophy. Moderate left and mild right foraminal narrowing.  Partial effacement of lateral recesses and mild canal stenosis. L4-5: Moderate disc bulge with posterior marginal osteophytes combined with right greater than left facet and ligamentum flavum hypertrophy. Moderate left and severe right foraminal narrowing. Moderate canal and lateral recess stenosis. Prominent left-sided ligamentum flavum hypertrophy displaces the cauda equina rightward. L5-S1: Uncovered disc and small disc bulge combined with advanced facet and ligamentum flavum hypertrophy resulting in severe bilateral foraminal narrowing and moderate to severe canal stenosis. IMPRESSION: 1. Moderate progression of lumbar spondylosis from 2012 with advanced facet and discogenic degenerative changes. Stable grade 2 anterolisthesis of L5-S1. 2. Multiple levels of mild-to-moderate foraminal  narrowing with severe foraminal narrowing at the right L4-5 and bilateral L5-S1 levels. 3. Moderate L4-5 and moderate to severe L5-S1 multifactorial canal stenosis. 4. Partially visualized is edema and mild enhancement of the sacrum and surrounding soft tissues greatest in the right sacral ala, suspected sacral insufficiency fracture. Correlation with pelvic CT recommended. 5. Nonspecific smooth mild dural enhancement without collection, possibly related to recent intervention. Differential considerations include decreased CSF pressure, venous engorgement, or spinal patchymeningitis. Infection is possible but considered less likely. No abnormal enhancement of the cauda equina or visualized conus medullaris. Electronically Signed   By: Kristine Garbe M.D.   On: 09/23/2016 04:39    Microbiology: Recent Results (from the past 240 hour(s))  Culture, blood (x 2)     Status: None (Preliminary result)   Collection Time: 09/23/16  7:35 AM  Result Value Ref Range Status   Specimen Description BLOOD RIGHT ANTECUBITAL  Final   Special Requests   Final    BOTTLES DRAWN AEROBIC AND ANAEROBIC 10CC AER 5CC ANA   Culture NO GROWTH 3 DAYS  Final   Report Status PENDING  Incomplete  Culture, blood (x 2)     Status: None (Preliminary result)   Collection Time: 09/23/16  7:40 AM  Result Value Ref Range Status   Specimen Description BLOOD LEFT HAND  Final   Special Requests BOTTLES DRAWN AEROBIC ONLY 10CC  Final   Culture NO GROWTH 3 DAYS  Final   Report Status PENDING  Incomplete  MRSA PCR Screening     Status: None   Collection Time: 09/23/16 11:51 AM  Result Value Ref Range Status   MRSA by PCR NEGATIVE NEGATIVE Final    Comment:        The GeneXpert MRSA Assay (FDA approved for NASAL specimens only), is one component of a comprehensive MRSA colonization surveillance program. It is not intended to diagnose MRSA infection nor to guide or monitor treatment for MRSA infections.       Labs: Basic Metabolic Panel:  Recent Labs Lab 09/22/16 1838 09/24/16 1029 09/25/16 0500 09/26/16 1307 09/27/16 0652  NA 143 143 140 137 136  K 3.7 4.0 3.6 4.1 3.6  CL 103 105 103 103 101  CO2 30 26 27 28 27   GLUCOSE 177* 174* 110* 126* 125*  BUN 42* 22* 21* 21* 18  CREATININE 1.24* 0.99 1.15* 1.10* 0.94  CALCIUM 10.8* 10.3 10.3 11.9* 12.1*  MG  --   --  1.7  --   --    Liver Function Tests:  Recent Labs Lab 09/25/16 0500  AST 17  ALT 11*  ALKPHOS 87  BILITOT 0.6  PROT 4.8*  ALBUMIN 2.7*   No results for input(s): LIPASE, AMYLASE in the last 168 hours. No results for input(s): AMMONIA in the last 168 hours. CBC:  Recent Labs Lab 09/22/16 1838 09/24/16 1029 09/25/16 0500 09/26/16  1307 09/27/16 0652  WBC 12.3* 7.9 6.9 8.5 7.0  NEUTROABS 5.6  --   --   --   --   HGB 9.5* 9.0* 7.7* 8.5* 8.0*  HCT 26.6* 26.0* 22.4* 25.1* 23.4*  MCV 100.8* 102.4* 100.4* 101.2* 100.0  PLT 169 149* 145* 160 154   Cardiac Enzymes:  Recent Labs Lab 09/23/16 0740 09/23/16 1415 09/23/16 1905  TROPONINI <0.03 <0.03 <0.03   BNP: BNP (last 3 results) No results for input(s): BNP in the last 8760 hours.  ProBNP (last 3 results) No results for input(s): PROBNP in the last 8760 hours.  CBG: No results for input(s): GLUCAP in the last 168 hours.     SignedFlorencia Reasons MD, PhD  [facility] 09/27/2016, 2:13 PM

## 2016-09-27 NOTE — Clinical Social Work Placement (Signed)
   CLINICAL SOCIAL WORK PLACEMENT  NOTE  Date:  09/27/2016  Patient Details  Name: Kristin [name] MRN: [id number] Date of Birth: 10/13/1930  Clinical Social Work is seeking post-discharge placement for this patient at the [place] level of care (*CSW will initial, date and re-position this form in  chart as items are completed):  Yes   Patient/family provided with [facility] Work Department's list of facilities offering this level of care within the geographic area requested by the patient (or if unable, by the patient's family).  Yes   Patient/family informed of their freedom to choose among providers that offer the needed level of care, that participate in Medicare, Medicaid or managed care program needed by the patient, have an available bed and are willing to accept the patient.  Yes   Patient/family informed of [facility]'s ownership interest in [facility] and [facility], as well as of the fact that they are under no obligation to receive care at these facilities.  PASRR submitted to EDS on       PASRR number received on       Existing PASRR number confirmed on 09/26/16     FL2 transmitted to all facilities in geographic area requested by pt/family on 09/26/16     FL2 transmitted to all facilities within larger geographic area on       Patient informed that his/her managed care company has contracts with or will negotiate with certain facilities, including the following:        Yes   Patient/family informed of bed offers received.  Patient chooses bed at [facility]     Physician recommends and patient chooses bed at      Patient to be transferred to [facility] on 09/27/16.  Patient to be transferred to facility by ptar     Patient family notified on 09/27/16 of transfer.  Name of family member notified:  Hassan Rowan     PHYSICIAN Please sign FL2     Additional Comment:     _______________________________________________ Jorge Ny, LCSW 09/27/2016, 1:12 PM

## 2016-09-27 NOTE — Progress Notes (Signed)
Pharmacy Antibiotic Note  Kristin [name] is a 83 y.o. female admitted on 09/22/2016 with sepsis.  Pharmacy has been consulted for Vancomycin and Cefepime dosing.  Concern for spinal infection as well, repeat MRI today  Vanc trough within goal range on current dose Renal function is stable  Plan: Continue Vancomycin 1020m IV q24 Continue Cefepime 2g IV q24 F/U culture results and watch renal fxn F/U repeat MRI.  Height: 4' 11"  (149.9 cm) Weight: 158 lb 1.1 oz (71.7 kg) IBW/kg (Calculated) : 43.2  Temp (24hrs), Avg:99.1 F (37.3 C), Min:98.5 F (36.9 C), Max:99.8 F (37.7 C)   Recent Labs Lab 09/22/16 1838 09/23/16 0730 09/23/16 0909 09/24/16 1029 09/25/16 0500 09/26/16 1307 09/27/16 0652  WBC 12.3*  --   --  7.9 6.9 8.5 7.0  CREATININE 1.24*  --   --  0.99 1.15* 1.10* 0.94  LATICACIDVEN  --  1.0 1.2  --   --   --   --   VANCOTROUGH  --   --   --   --   --   --  16    Estimated Creatinine Clearance: 38.4 mL/min (by C-G formula based on SCr of 0.94 mg/dL).    Allergies  Allergen Reactions  . Alendronate Nausea Only and Other (See Comments)    Hip pain  . Penicillins Rash    Has patient had a PCN reaction causing immediate rash, facial/tongue/throat swelling, SOB or lightheadedness with hypotension: no Has patient had a PCN reaction causing severe rash involving mucus membranes or skin necrosis: no Has patient had a PCN reaction that required hospitalization no Has patient had a PCN reaction occurring within the last 10 years:no If all of the above answers are "NO", then may proceed with Cephalosporin use.    . Sulfonamide Derivatives Itching and Rash    Antimicrobials this admission: 2/9 Vanc >>  2/9 Cefepime >>   Dose adjustments this admission: 2/13  VT = 16  Microbiology results: 2/9 BCx2: ngtd 2/9 MRSA PCR: neg  Thank you for allowing pharmacy to be a part of this patient's care.  KGracy Bruins PharmD Clinical Pharmacist [facility]

## 2016-09-27 NOTE — Progress Notes (Addendum)
Patient will discharge to [facility] Anticipated discharge date: 2/13 Family notified: pt dtrs at bedside Transportation by [facility]- scheduled fro 3:30pm  CSW signing off.  Jorge Ny, LCSW Clinical Social Worker [phone]

## 2016-09-28 LAB — CULTURE, BLOOD (ROUTINE X 2)
Culture: NO GROWTH
Culture: NO GROWTH

## 2016-10-02 ENCOUNTER — Emergency Department (HOSPITAL_COMMUNITY)
Admission: EM | Admit: 2016-10-02 | Discharge: 2016-10-02 | Disposition: A | Discharge status: Home / Self Care | Payer: Medicare Other | Attending: Emergency Medicine | Admitting: Emergency Medicine

## 2016-10-02 ENCOUNTER — Emergency Department (HOSPITAL_COMMUNITY): Payer: Medicare Other

## 2016-10-02 ENCOUNTER — Encounter (HOSPITAL_COMMUNITY): Payer: Self-pay | Admitting: Emergency Medicine

## 2016-10-02 DIAGNOSIS — Y929 Unspecified place or not applicable: Secondary | ICD-10-CM | POA: Diagnosis not present

## 2016-10-02 DIAGNOSIS — S0990XA Unspecified injury of head, initial encounter: Secondary | ICD-10-CM | POA: Diagnosis present

## 2016-10-02 DIAGNOSIS — Y999 Unspecified external cause status: Secondary | ICD-10-CM | POA: Insufficient documentation

## 2016-10-02 DIAGNOSIS — S0510XA Contusion of eyeball and orbital tissues, unspecified eye, initial encounter: Secondary | ICD-10-CM | POA: Insufficient documentation

## 2016-10-02 DIAGNOSIS — W1830XA Fall on same level, unspecified, initial encounter: Secondary | ICD-10-CM | POA: Diagnosis not present

## 2016-10-02 DIAGNOSIS — M25512 Pain in left shoulder: Secondary | ICD-10-CM | POA: Insufficient documentation

## 2016-10-02 DIAGNOSIS — Y939 Activity, unspecified: Secondary | ICD-10-CM | POA: Diagnosis not present

## 2016-10-02 DIAGNOSIS — W19XXXA Unspecified fall, initial encounter: Secondary | ICD-10-CM

## 2016-10-02 NOTE — ED Notes (Signed)
Patient transported to CT 

## 2016-10-02 NOTE — ED Provider Notes (Signed)
Patient fell earlier today. On exam she is alert moves all extremities. She has mild pain on internal and external rotation of the left thigh. All 4 extremities without deformity, neurovascular intact. Chest and abdomen nontender. Pelvis stable x-rays viewed by me.   Orlie Dakin, MD 10/02/16 1422

## 2016-10-02 NOTE — ED Triage Notes (Signed)
Fall while attempting to walk to RR; struck posterior head on floor; bump noted to right occipital area along with some pain.  Also c/o right shoulder pain.  Tenderness to prox humerus on palp.

## 2016-10-02 NOTE — ED Notes (Signed)
ED Provider at bedside. 

## 2016-10-02 NOTE — ED Notes (Signed)
Pt on bedpan.

## 2016-10-02 NOTE — ED Notes (Signed)
D/C instructions discussed with pt daughter. [facility] called; waiting on transport.

## 2016-10-02 NOTE — Discharge Instructions (Signed)
Please follow with your primary care doctor in the next 2 days for a check-up. They must obtain records for further management.  ° °Do not hesitate to return to the Emergency Department for any new, worsening or concerning symptoms.  ° °

## 2016-10-02 NOTE — ED Notes (Signed)
Returned from xray

## 2016-10-02 NOTE — ED Provider Notes (Signed)
[place] DEPT Provider Note   CSN: [id number] Arrival date & time: 10/02/16  1140     History   Chief Complaint Chief Complaint  Kristin [name] presents with  . Fall    HPI  Blood pressure 153/72, temperature 98.3 F (36.8 C), temperature source Oral, resp. rate (!) 27, weight 70.4 kg, SpO2 95 %.  Kristin [name] is a 83 y.o. female accompanied by Kristin [name] daughter (Kristin [name] is full code) complaining of shoulder pain and head trauma status post fall this morning. Kristin [name] can't really explain exactly how Kristin [name] fell, Kristin [name] was a bleeding to the bathroom it is unclear if Kristin [name] lost consciousness. Kristin [name] is not anticoagulated. Kristin [name] is at [facility] for rehabilitation due to severe low back pain. As per daughter Kristin [name] has been taking oxycodone and has been causing some confusion, they do not wish to return to the rehabilitation, they feel that Kristin [name] can be discharged home and they will care for Kristin [name] at [name]. They will follow with primary care to set up home physical therapy. Kristin [name] is refusing physical therapy at rehabilitation facility because Kristin [name] states the pain is so severe. Kristin [name] was recently admitted to the hospital for intractable back pain. MRI showed a nonspecific mild dural enhancement thought by Dr. Trenton Gammon do be secondary to recent steroid injections. Kristin [name] was borderline febrile and Kristin [name] had multiple days of vancomycin and Zosyn. Kristin [name] has not had any fever at home. As per Kristin [name] daughter Kristin [name] is much more confused stating that Kristin [name] is vomiting when Kristin [name] is not. Kristin [name] is also constipated. Kristin [name] has not had a bowel movement in several days Kristin [name] did have Kristin [name] MiraLAX this morning.  Past Medical History:  Diagnosis Date  . Allergy   . Arthritis   . Chronic kidney disease (CKD), stage III (moderate)    Archie Endo 09/23/2016  . Depression    Risperdal "twice/day" (09/23/2016)  . Glaucoma   . Hiatal hernia   . Hypertension   . Intractable back pain    /notes 09/23/2016  . Obesity   . Osteoporosis    vertebral fracture in 2012    . Upper GI bleed 04/2004   secondary to Mallory-Weiss tear/notes 12/28/2010  . Vertigo     Kristin [name] Active Problem List   Diagnosis Date Noted  . Anemia 09/23/2016  . Sepsis ([place]) 09/23/2016  . Back pain 09/23/2016  . Intractable back pain 06/20/2016  . Other malaise and fatigue 11/26/2013  . Vertigo   . Obesity   . Arthritis of knee, left 07/09/2013  . Need for prophylactic vaccination and inoculation against influenza 07/09/2013  . CKD (chronic kidney disease), stage III 01/01/2013  . Osteoporosis with pathological fracture 01/01/2013  . HLD (hyperlipidemia) 01/01/2013  . HTN (hypertension) 01/01/2013  . Chronic back pain 01/01/2013  . Vertebral compression fracture ([place]) 01/01/2013  . Seasonal allergic rhinitis 01/01/2013    Past Surgical History:  Procedure Laterality Date  . ABDOMINAL HYSTERECTOMY  1974   partial  . APPENDECTOMY     as a child/notes 12/28/2010  . BACK SURGERY    . BREAST BIOPSY Left 1999   Archie Endo 12/28/2010  . CATARACT EXTRACTION, BILATERAL Bilateral 2000s  . ESOPHAGOGASTRODUODENOSCOPY  04/2004   Archie Endo 12/28/2010  . KYPHOPLASTY  05/2011   T10/notes 05/25/2011  . LAPAROSCOPIC CHOLECYSTECTOMY  10/2007   Archie Endo 12/14/2010    OB History    No data available       Home Medications    Prior to Admission medications   Medication Sig Start Date  End Date Taking? Authorizing Provider  amLODipine (NORVASC) 5 MG tablet Take 1 tablet (5 mg total) by mouth daily. 02/19/14  Yes Cherre Robins, PharmD  aspirin 81 MG tablet Take 81 mg by mouth daily.   Yes Historical Provider, MD  bisacodyl (DULCOLAX) 10 MG suppository Place 1 suppository (10 mg total) rectally daily as needed for moderate constipation. 06/21/16  Yes Debbe Odea, MD  ezetimibe (ZETIA) 10 MG tablet Take 1 tablet (10 mg total) by mouth daily. 04/05/13  Yes [name], MD  ibandronate (BONIVA) 150 MG tablet Take 1 tablet (150 mg total) by mouth every 30 (thirty) days. Take in the morning with a  full glass of water, on an empty stomach, and do not take anything else by mouth or lie down for the next 30 min. 02/19/14  Yes Cherre Robins, PharmD  losartan-hydrochlorothiazide (HYZAAR) 50-12.5 MG tablet Take 1 tablet by mouth daily. 08/18/16  Yes Historical Provider, MD  meclizine (ANTIVERT) 25 MG tablet TAKE ONE TABLET BY MOUTH THREE TIMES DAILY AS NEEDED Kristin [name] taking differently: Take 25 mg by mouth 3 (three) times daily as needed for dizziness. TAKE ONE TABLET BY MOUTH THREE TIMES DAILY AS NEEDED 06/20/14  Yes Chipper Herb, MD  Multiple Vitamins-Minerals (CENTRUM SILVER PO) Take 1 tablet by mouth daily.    Yes Historical Provider, MD  oxyCODONE-acetaminophen (PERCOCET/ROXICET) 5-325 MG tablet Take 1 tablet by mouth every 4 (four) hours. 09/27/16  Yes Florencia Reasons, MD  polyethylene glycol powder (GLYCOLAX/MIRALAX) powder Take 17 g by mouth daily. 07/05/16  Yes Historical Provider, MD  RESTASIS 0.05 % ophthalmic emulsion Place 1 drop into both eyes daily.  01/31/13  Yes Historical Provider, MD  risperiDONE (RISPERDAL) 0.5 MG tablet Take 1 tablet (0.5 mg total) by mouth 2 (two) times daily. 06/21/16  Yes Debbe Odea, MD    Family History Family History  Problem Relation Age of Onset  . Hypertension Mother   . Osteoporosis Mother   . CVA Mother   . Cancer Neg Hx     Social History Social History  Substance Use Topics  . Smoking status: Never Smoker  . Smokeless tobacco: Never Used  . Alcohol use Yes     Comment: 09/23/2016 "glass of wine a few times/year"     Allergies   Alendronate; Penicillins; and Sulfonamide derivatives   Review of Systems Review of Systems  10 systems reviewed and found to be negative, except as noted in the HPI.   Physical Exam Updated Vital Signs BP 149/68 (BP Location: Right Arm)   Pulse 97   Temp 98.3 F (36.8 C) (Oral)   Resp 21   Wt 70.4 kg   SpO2 95%   BMI 31.33 kg/m   Physical Exam  Constitutional: Kristin [name] is oriented to person, place, and time.  Kristin [name] appears well-developed and well-nourished. No distress.  HENT:  Head: Normocephalic.  Mouth/Throat: Oropharynx is clear and moist.  Contusion to occipital area with no underlying crepitance, no hemotympanums.   Pupils equal round and reactive to light  Eyes: Conjunctivae and EOM are normal. Pupils are equal, round, and reactive to light.  Neck: Normal range of motion.  + midline C-spine  tenderness to palpation No step-offs appreciated.  Grip strength, biceps, triceps 5/5 bilaterally;  can differentiate between pinprick and light touch bilaterally.    Cardiovascular: Normal rate, regular rhythm and intact distal pulses.  Exam reveals no gallop and no friction rub.   No murmur heard. Pulmonary/Chest: Effort normal and breath sounds  normal. No respiratory distress. Kristin [name] has no wheezes. Kristin [name] has no rales. Kristin [name] exhibits no tenderness.  Abdominal: Soft. Kristin [name] exhibits no distension and no mass. There is no tenderness. There is no rebound and no guarding. No hernia.  Musculoskeletal: Normal range of motion. Kristin [name] exhibits tenderness.  Left shoulder with no deformity, tender to palpation along the proximal humerus, distally neurovascular intact, no tenderness to palpation of the elbow, Kristin [name] is diffusely tender to palpation along the bilateral dorsum of the wrist.  Neurological: Kristin [name] is alert and oriented to person, place, and time.  Skin: Capillary refill takes less than 2 seconds. Kristin [name] is not diaphoretic.  Psychiatric: Kristin [name] has a normal mood and affect.  Nursing note and vitals reviewed.    ED Treatments / Results  Labs (all labs ordered are listed, but only abnormal results are displayed) Labs Reviewed - No data to display  EKG  EKG Interpretation  Date/Time:  Sunday October 02 2016 11:54:08 EST Ventricular Rate:  99 PR Interval:    QRS Duration: 93 QT Interval:  333 QTC Calculation: 428 R Axis:   -8 Text Interpretation:  Sinus rhythm Borderline prolonged PR interval LVH by voltage  Anterior Q waves, possibly due to LVH No significant change since last tracing Confirmed by Winfred Leeds  MD, SAM [phone]) on 10/02/2016 12:04:09 PM       Radiology Dg Ribs Unilateral W/chest Left  Result Date: 10/02/2016 CLINICAL DATA:  Pain following fall EXAM: LEFT RIBS AND CHEST - 3+ VIEW COMPARISON:  September 23, 2016 chest radiograph FINDINGS: Frontal chest as well as oblique and cone-down lower rib images obtained. There is no edema or consolidation. Heart is mildly enlarged with pulmonary vascular within normal limits. There is aortic atherosclerosis. No adenopathy. Kristin [name] is status post kyphoplasty procedure at T9. There is degenerative change in the thoracic spine. There is no pneumothorax or pleural effusion. No evident rib fracture. IMPRESSION: No evident rib fracture. Lungs clear. Stable cardiomegaly. There is aortic atherosclerosis. Status post kyphoplasty procedure at T9. Electronically Signed   By: Lowella Grip III M.D.   On: 10/02/2016 13:48   Dg Wrist Complete Left  Result Date: 10/02/2016 CLINICAL DATA:  [place] today, BILATERAL wrist pain, BILATERAL hip pain EXAM: LEFT WRIST - COMPLETE 3+ VIEW COMPARISON:  None FINDINGS: Osseous demineralization. Degenerative changes at first [facility] joint. Widened scapholunate interval consistent with scapholunate ligament tear. Degenerative changes at the distal radioulnar joint. No acute fracture, dislocation, or bone destruction. IMPRESSION: Osseous demineralization with degenerative changes at first [facility] joint and distal radioulnar joint. Torn scapholunate ligament. No acute osseous abnormalities. Electronically Signed   By: Lavonia Dana M.D.   On: 10/02/2016 13:49   Dg Wrist Complete Right  Result Date: 10/02/2016 CLINICAL DATA:  [place] today, BILATERAL wrist pain, BILATERAL hip pain EXAM: RIGHT WRIST - COMPLETE 3+ VIEW COMPARISON:  None FINDINGS: Osseous demineralization. Widened scapholunate interval consistent with torn scapholunate ligament. No  acute fracture, dislocation, or bone destruction. Atherosclerotic calcifications within the radial artery. IMPRESSION: Osseous demineralization with evidence of a torn scapholunate ligament. No acute abnormalities. Electronically Signed   By: Lavonia Dana M.D.   On: 10/02/2016 13:49   Ct Head Wo Contrast  Result Date: 10/02/2016 CLINICAL DATA:  Pain following fall EXAM: CT HEAD WITHOUT CONTRAST CT CERVICAL SPINE WITHOUT CONTRAST TECHNIQUE: Multidetector CT imaging of the head and cervical spine was performed following the standard protocol without intravenous contrast. Multiplanar CT image reconstructions of the cervical spine were also generated. COMPARISON:  Head CT February 10, 2011; cervical spine CT April 26, 2007 FINDINGS: CT HEAD FINDINGS Brain: There is mild diffuse atrophy. There is no intracranial mass, hemorrhage, extra-axial fluid collection, or midline shift. There is mild patchy small vessel disease in the centra semiovale bilaterally. Elsewhere gray-white compartments appear normal. No acute infarct evident. Vascular: No hyperdense vessels. There is calcification in both carotid siphon regions bilaterally. Skull: There is a state apparent either calcified meningioma or enostosis arising from the posterior left frontal region measuring 2.5 x 1.4 cm, stable. There is no surrounding edema or mass effect in this area. No fracture evident. Bones are somewhat osteopenic. Sinuses/Orbits: There is mild mucosal thickening in several ethmoid air cells bilaterally. Visualized paranasal sinuses elsewhere clear. Orbits appear symmetric bilaterally. Other: There are scattered areas of inferior mastoid air cell opacity bilaterally. Most of the mastoids bilaterally are clear. CT CERVICAL SPINE FINDINGS Alignment: There is no spondylolisthesis. Skull base and vertebrae: Skull base and craniocervical junction regions appear normal. Bones are osteoporotic. There is no demonstrable acute fracture. An oblique lucency  in the posterior inferior odontoid is stable compared to 2008 study is felt to represent a prominent nutrient foramen presence of osteoporosis. There are no blastic or lytic bone lesions. Soft tissues and spinal canal: Prevertebral soft tissues and predental space regions are normal. There is no paraspinous lesion. There is no cord or canal hematoma demonstrable. No spinal stenosis. Disc levels: There is marked disc space narrowing at C3-4, C5-6, and C6-7. There is moderate disc space narrowing at C4-5. There is facet hypertrophy at multiple levels. No disc extrusion evident. Upper chest: Limited visualization of the lung apices. Visualized regions clear. Other: There is carotid artery calcification on the left. IMPRESSION: CT head: Stable atrophy with mild periventricular small vessel disease. Areas of mastoid disease bilaterally. Mild mucosal thickening in several ethmoid air cells noted. No fracture. Benign calcified meningioma versus enostosis in the posterior left frontal region without mass effect or edema. This area appears benign and stable and is not felt to have clinical significance. Foci of arterial vascular calcification noted. CT cervical spine: No fracture or spondylolisthesis. Bones osteoporotic. There is multilevel osteoarthritic change. There is mild calcification in the left carotid artery. Electronically Signed   By: Lowella Grip III M.D.   On: 10/02/2016 14:32   Ct Cervical Spine Wo Contrast  Result Date: 10/02/2016 CLINICAL DATA:  Pain following fall EXAM: CT HEAD WITHOUT CONTRAST CT CERVICAL SPINE WITHOUT CONTRAST TECHNIQUE: Multidetector CT imaging of the head and cervical spine was performed following the standard protocol without intravenous contrast. Multiplanar CT image reconstructions of the cervical spine were also generated. COMPARISON:  Head CT February 10, 2011; cervical spine CT April 26, 2007 FINDINGS: CT HEAD FINDINGS Brain: There is mild diffuse atrophy. There is no  intracranial mass, hemorrhage, extra-axial fluid collection, or midline shift. There is mild patchy small vessel disease in the centra semiovale bilaterally. Elsewhere gray-white compartments appear normal. No acute infarct evident. Vascular: No hyperdense vessels. There is calcification in both carotid siphon regions bilaterally. Skull: There is a state apparent either calcified meningioma or enostosis arising from the posterior left frontal region measuring 2.5 x 1.4 cm, stable. There is no surrounding edema or mass effect in this area. No fracture evident. Bones are somewhat osteopenic. Sinuses/Orbits: There is mild mucosal thickening in several ethmoid air cells bilaterally. Visualized paranasal sinuses elsewhere clear. Orbits appear symmetric bilaterally. Other: There are scattered areas of inferior mastoid air cell opacity bilaterally. Most of the mastoids bilaterally  are clear. CT CERVICAL SPINE FINDINGS Alignment: There is no spondylolisthesis. Skull base and vertebrae: Skull base and craniocervical junction regions appear normal. Bones are osteoporotic. There is no demonstrable acute fracture. An oblique lucency in the posterior inferior odontoid is stable compared to 2008 study is felt to represent a prominent nutrient foramen presence of osteoporosis. There are no blastic or lytic bone lesions. Soft tissues and spinal canal: Prevertebral soft tissues and predental space regions are normal. There is no paraspinous lesion. There is no cord or canal hematoma demonstrable. No spinal stenosis. Disc levels: There is marked disc space narrowing at C3-4, C5-6, and C6-7. There is moderate disc space narrowing at C4-5. There is facet hypertrophy at multiple levels. No disc extrusion evident. Upper chest: Limited visualization of the lung apices. Visualized regions clear. Other: There is carotid artery calcification on the left. IMPRESSION: CT head: Stable atrophy with mild periventricular small vessel disease.  Areas of mastoid disease bilaterally. Mild mucosal thickening in several ethmoid air cells noted. No fracture. Benign calcified meningioma versus enostosis in the posterior left frontal region without mass effect or edema. This area appears benign and stable and is not felt to have clinical significance. Foci of arterial vascular calcification noted. CT cervical spine: No fracture or spondylolisthesis. Bones osteoporotic. There is multilevel osteoarthritic change. There is mild calcification in the left carotid artery. Electronically Signed   By: Lowella Grip III M.D.   On: 10/02/2016 14:32   Ct Hip Left Wo Contrast  Result Date: 10/02/2016 CLINICAL DATA:  Fall today with bilateral hip pain. Indeterminate radiographs. EXAM: CT OF THE LEFT HIP WITHOUT CONTRAST TECHNIQUE: Multidetector CT imaging of the left hip was performed according to the standard protocol. Multiplanar CT image reconstructions were also generated. COMPARISON:  Radiograph same date and 03/05/2009. FINDINGS: Bones/Joint/Cartilage This examination is limited to the left hip and inferior left hemipelvis. The mineralization appears adequate. There is no evidence of acute fracture, dislocation or femoral head avascular necrosis. Mild subchondral cyst formation is present within the acetabulum. There are mild degenerative changes of the left sacroiliac joint. There is no significant hip joint effusion. Ligaments Not relevant for exam/indication. Muscles and Tendons Fatty atrophy within the gluteus minimus muscle. No hematoma or tendon rupture identified. Soft tissues No periarticular hematoma or other fluid collection seen. There is iliofemoral atherosclerosis. IMPRESSION: No evidence of proximal left femur fracture or dislocation. Electronically Signed   By: Richardean Sale M.D.   On: 10/02/2016 16:42   Dg Shoulder Left  Result Date: 10/02/2016 CLINICAL DATA:  [place] today, bruising and pain lateral LEFT shoulder EXAM: LEFT SHOULDER - 2+ VIEW  COMPARISON:  None FINDINGS: Osseous demineralization. Mild LEFT glenohumeral degenerative changes with joint space narrowing and spur formation. AC joint alignment normal. No acute fracture, dislocation, or bone destruction. Prior spinal augmentation procedure at the lower thoracic spine. Aortic atherosclerosis noted. Visualized LEFT ribs intact. IMPRESSION: Osseous demineralization with LEFT glenohumeral degenerative changes. No acute osseous abnormalities. Aortic atherosclerosis. Electronically Signed   By: Lavonia Dana M.D.   On: 10/02/2016 13:47   Dg Hips Bilat With Pelvis 3-4 Views  Result Date: 10/02/2016 CLINICAL DATA:  [place] today, BILATERAL hip pain, BILATERAL wrist pain EXAM: DG HIP (WITH OR WITHOUT PELVIS) 3-4V BILAT COMPARISON:  None FINDINGS: Osseous demineralization. Hip and SI joint spaces symmetric and preserved. Proximal RIGHT femur appears intact. Questionable abnormal appearance at the LEFT femoral head/neck junction, could be related to rotation of the leg but subtle fracture not completely excluded. No  additional fracture, dislocation or bone destruction. Degenerative disc and facet disease changes at visualized lower lumbar spine. Scattered atherosclerotic calcifications. IMPRESSION: Osseous demineralization. Proximal RIGHT femur appears intact but a subtle subcapital fracture of the LEFT femoral neck is not completely excluded ; if Kristin [name] has persistent pain at this site recommend additional imaging. Electronically Signed   By: Lavonia Dana M.D.   On: 10/02/2016 13:54    Procedures Procedures (including critical care time)  Medications Ordered in ED Medications - No data to display   Initial Impression / Assessment and Plan / ED Course  I have reviewed the triage vital signs and the nursing notes.  Pertinent labs & imaging results that were available during my care of the Kristin [name] were reviewed by me and considered in my medical decision making (see chart for details).      Vitals:   10/02/16 1430 10/02/16 1500 10/02/16 1530 10/02/16 1727  BP: 156/73 151/75 148/75 149/68  Pulse: 94 96 96 97  Resp: 26 18 21 21   Temp:      TempSrc:      SpO2: 96% 96% 97% 95%  Weight:        Medications - No data to display  RAYLYNN HERSH is 83 y.o. female presenting with Fall while ambulating to the bathroom this morning. Mild head trauma, nonfocal neuro exam, Kristin [name] does seem slightly confused however daughter states this is normal for Kristin [name] baseline after Kristin [name] started the oxycodone which Kristin [name]'s been taking for Kristin [name] severe low back pain. He had C-spine negative, plain films of the shoulder wrist and ribs are negative, pelvic x-ray with abnormality on the left side, will obtain CT to further evaluate  CT negative, had an extensive discussion with Kristin [name]'s daughters and they will take Kristin [name] back to the rehabilitation facility, we will discuss with the primary care doctor Dr. Nell Range also recommended that they make an appoint with neurosurgery and consider pain management.  This is a shared visit with the attending physician who personally evaluated the Kristin [name] and agrees with the care plan.   Evaluation does not show pathology that would require ongoing emergent intervention or inpatient treatment. Pt is hemodynamically stable and mentating appropriately. Discussed findings and plan with Kristin [name]/guardian, who agrees with care plan. All questions answered. Return precautions discussed and outpatient follow up given.    Final Clinical Impressions(s) / ED Diagnoses   Final diagnoses:  Fall, initial encounter    New Prescriptions New Prescriptions   No medications on file     Monico Blitz, PA-C 10/02/16 [place], MD 10/02/16 1757

## 2016-10-02 NOTE — ED Notes (Signed)
Patient transported to X-ray 

## 2016-10-18 ENCOUNTER — Encounter (HOSPITAL_COMMUNITY): Payer: Self-pay

## 2016-10-18 ENCOUNTER — Emergency Department (HOSPITAL_COMMUNITY): Payer: Medicare Other

## 2016-10-18 ENCOUNTER — Inpatient Hospital Stay (HOSPITAL_COMMUNITY): Payer: Medicare Other

## 2016-10-18 ENCOUNTER — Inpatient Hospital Stay (HOSPITAL_COMMUNITY)
Admission: EM | Admit: 2016-10-18 | Discharge: 2016-10-26 | DRG: 628 | Disposition: A | Discharge status: Home / Self Care | Payer: Medicare Other | Attending: Internal Medicine | Admitting: Internal Medicine

## 2016-10-18 DIAGNOSIS — M549 Dorsalgia, unspecified: Secondary | ICD-10-CM | POA: Diagnosis present

## 2016-10-18 DIAGNOSIS — R410 Disorientation, unspecified: Secondary | ICD-10-CM | POA: Diagnosis present

## 2016-10-18 DIAGNOSIS — D472 Monoclonal gammopathy: Secondary | ICD-10-CM | POA: Diagnosis present

## 2016-10-18 DIAGNOSIS — D539 Nutritional anemia, unspecified: Secondary | ICD-10-CM | POA: Diagnosis present

## 2016-10-18 DIAGNOSIS — Y92129 Unspecified place in nursing home as the place of occurrence of the external cause: Secondary | ICD-10-CM | POA: Diagnosis not present

## 2016-10-18 DIAGNOSIS — K224 Dyskinesia of esophagus: Secondary | ICD-10-CM | POA: Diagnosis present

## 2016-10-18 DIAGNOSIS — N179 Acute kidney failure, unspecified: Secondary | ICD-10-CM | POA: Diagnosis present

## 2016-10-18 DIAGNOSIS — E876 Hypokalemia: Secondary | ICD-10-CM | POA: Diagnosis not present

## 2016-10-18 DIAGNOSIS — M4854XA Collapsed vertebra, not elsewhere classified, thoracic region, initial encounter for fracture: Secondary | ICD-10-CM | POA: Diagnosis present

## 2016-10-18 DIAGNOSIS — Z882 Allergy status to sulfonamides status: Secondary | ICD-10-CM

## 2016-10-18 DIAGNOSIS — Z888 Allergy status to other drugs, medicaments and biological substances status: Secondary | ICD-10-CM

## 2016-10-18 DIAGNOSIS — W19XXXD Unspecified fall, subsequent encounter: Secondary | ICD-10-CM | POA: Diagnosis not present

## 2016-10-18 DIAGNOSIS — K449 Diaphragmatic hernia without obstruction or gangrene: Secondary | ICD-10-CM | POA: Diagnosis present

## 2016-10-18 DIAGNOSIS — D631 Anemia in chronic kidney disease: Secondary | ICD-10-CM | POA: Diagnosis present

## 2016-10-18 DIAGNOSIS — G8929 Other chronic pain: Secondary | ICD-10-CM | POA: Diagnosis present

## 2016-10-18 DIAGNOSIS — T148XXA Other injury of unspecified body region, initial encounter: Secondary | ICD-10-CM | POA: Diagnosis not present

## 2016-10-18 DIAGNOSIS — D649 Anemia, unspecified: Secondary | ICD-10-CM

## 2016-10-18 DIAGNOSIS — D630 Anemia in neoplastic disease: Secondary | ICD-10-CM | POA: Diagnosis present

## 2016-10-18 DIAGNOSIS — I129 Hypertensive chronic kidney disease with stage 1 through stage 4 chronic kidney disease, or unspecified chronic kidney disease: Secondary | ICD-10-CM | POA: Diagnosis present

## 2016-10-18 DIAGNOSIS — J90 Pleural effusion, not elsewhere classified: Secondary | ICD-10-CM | POA: Diagnosis present

## 2016-10-18 DIAGNOSIS — R42 Dizziness and giddiness: Secondary | ICD-10-CM

## 2016-10-18 DIAGNOSIS — E785 Hyperlipidemia, unspecified: Secondary | ICD-10-CM | POA: Diagnosis present

## 2016-10-18 DIAGNOSIS — M810 Age-related osteoporosis without current pathological fracture: Secondary | ICD-10-CM | POA: Diagnosis present

## 2016-10-18 DIAGNOSIS — G9340 Encephalopathy, unspecified: Secondary | ICD-10-CM | POA: Diagnosis not present

## 2016-10-18 DIAGNOSIS — R059 Cough, unspecified: Secondary | ICD-10-CM

## 2016-10-18 DIAGNOSIS — F329 Major depressive disorder, single episode, unspecified: Secondary | ICD-10-CM | POA: Diagnosis present

## 2016-10-18 DIAGNOSIS — R221 Localized swelling, mass and lump, neck: Secondary | ICD-10-CM

## 2016-10-18 DIAGNOSIS — E860 Dehydration: Secondary | ICD-10-CM | POA: Diagnosis present

## 2016-10-18 DIAGNOSIS — R112 Nausea with vomiting, unspecified: Secondary | ICD-10-CM | POA: Diagnosis not present

## 2016-10-18 DIAGNOSIS — E7800 Pure hypercholesterolemia, unspecified: Secondary | ICD-10-CM | POA: Diagnosis present

## 2016-10-18 DIAGNOSIS — N183 Chronic kidney disease, stage 3 (moderate): Secondary | ICD-10-CM | POA: Diagnosis present

## 2016-10-18 DIAGNOSIS — W19XXXA Unspecified fall, initial encounter: Secondary | ICD-10-CM | POA: Diagnosis present

## 2016-10-18 DIAGNOSIS — Z8262 Family history of osteoporosis: Secondary | ICD-10-CM

## 2016-10-18 DIAGNOSIS — I10 Essential (primary) hypertension: Secondary | ICD-10-CM | POA: Diagnosis not present

## 2016-10-18 DIAGNOSIS — C9000 Multiple myeloma not having achieved remission: Secondary | ICD-10-CM

## 2016-10-18 DIAGNOSIS — Z7983 Long term (current) use of bisphosphonates: Secondary | ICD-10-CM

## 2016-10-18 DIAGNOSIS — R627 Adult failure to thrive: Secondary | ICD-10-CM | POA: Diagnosis present

## 2016-10-18 DIAGNOSIS — G9341 Metabolic encephalopathy: Secondary | ICD-10-CM | POA: Diagnosis present

## 2016-10-18 DIAGNOSIS — K5900 Constipation, unspecified: Secondary | ICD-10-CM | POA: Diagnosis present

## 2016-10-18 DIAGNOSIS — D892 Hypergammaglobulinemia, unspecified: Secondary | ICD-10-CM | POA: Diagnosis not present

## 2016-10-18 DIAGNOSIS — S42002A Fracture of unspecified part of left clavicle, initial encounter for closed fracture: Secondary | ICD-10-CM | POA: Diagnosis present

## 2016-10-18 DIAGNOSIS — Z6827 Body mass index (BMI) 27.0-27.9, adult: Secondary | ICD-10-CM | POA: Diagnosis not present

## 2016-10-18 DIAGNOSIS — R05 Cough: Secondary | ICD-10-CM

## 2016-10-18 DIAGNOSIS — Z880 Allergy status to penicillin: Secondary | ICD-10-CM

## 2016-10-18 DIAGNOSIS — M546 Pain in thoracic spine: Secondary | ICD-10-CM | POA: Diagnosis not present

## 2016-10-18 LAB — CBC WITH DIFFERENTIAL/PLATELET
Basophils Absolute: 0.1 10*3/uL (ref 0.0–0.1)
Basophils Relative: 1 %
EOS PCT: 3 %
Eosinophils Absolute: 0.3 10*3/uL (ref 0.0–0.7)
HEMATOCRIT: 23.1 % — AB (ref 36.0–46.0)
Hemoglobin: 8 g/dL — ABNORMAL LOW (ref 12.0–15.0)
Lymphocytes Relative: 28 %
Lymphs Abs: 2.5 10*3/uL (ref 0.7–4.0)
MCH: 35.7 pg — ABNORMAL HIGH (ref 26.0–34.0)
MCHC: 34.6 g/dL (ref 30.0–36.0)
MCV: 103.1 fL — AB (ref 78.0–100.0)
MONO ABS: 1.5 10*3/uL — AB (ref 0.1–1.0)
MONOS PCT: 17 %
NEUTROS ABS: 4.5 10*3/uL (ref 1.7–7.7)
Neutrophils Relative %: 51 %
PLATELETS: 173 10*3/uL (ref 150–400)
RBC: 2.24 MIL/uL — ABNORMAL LOW (ref 3.87–5.11)
RDW: 14.8 % (ref 11.5–15.5)
WBC: 8.7 10*3/uL (ref 4.0–10.5)

## 2016-10-18 LAB — COMPREHENSIVE METABOLIC PANEL
ALT: 10 U/L — ABNORMAL LOW (ref 14–54)
ANION GAP: 7 (ref 5–15)
AST: 24 U/L (ref 15–41)
Albumin: 3.7 g/dL (ref 3.5–5.0)
Alkaline Phosphatase: 150 U/L — ABNORMAL HIGH (ref 38–126)
BILIRUBIN TOTAL: 1.1 mg/dL (ref 0.3–1.2)
BUN: 28 mg/dL — AB (ref 6–20)
CHLORIDE: 106 mmol/L (ref 101–111)
CO2: 29 mmol/L (ref 22–32)
Calcium: >15 mg/dL (ref 8.9–10.3)
Creatinine, Ser: 1.89 mg/dL — ABNORMAL HIGH (ref 0.44–1.00)
GFR, EST AFRICAN AMERICAN: 27 mL/min — AB (ref >60)
GFR, EST NON AFRICAN AMERICAN: 23 mL/min — AB (ref >60)
Glucose, Bld: 113 mg/dL — ABNORMAL HIGH (ref 65–99)
POTASSIUM: 3.7 mmol/L (ref 3.5–5.1)
Sodium: 142 mmol/L (ref 135–145)
TOTAL PROTEIN: 6.3 g/dL — AB (ref 6.5–8.1)

## 2016-10-18 LAB — I-STAT CG4 LACTIC ACID, ED: LACTIC ACID, VENOUS: 1.01 mmol/L (ref 0.5–1.9)

## 2016-10-18 MED ORDER — HYDRALAZINE HCL 20 MG/ML IJ SOLN: 5.0000 mg | INTRAMUSCULAR | Status: DC | PRN

## 2016-10-18 MED ORDER — SODIUM CHLORIDE 0.9 % IV SOLN
INTRAVENOUS | Status: DC
  Administered 2016-10-18 – 2016-10-19 (×2): via INTRAVENOUS

## 2016-10-18 MED ORDER — CYCLOSPORINE 0.05 % OP EMUL
1.0000 [drp] | Freq: Every day | OPHTHALMIC | Status: DC
Start: 2016-10-18 — End: 2016-10-26
  Administered 2016-10-19 – 2016-10-26 (×8): 1 [drp] via OPHTHALMIC
  Filled 2016-10-18 (×9): qty 1

## 2016-10-18 MED ORDER — ENOXAPARIN SODIUM 40 MG/0.4ML ~~LOC~~ SOLN: 40.0000 mg | Freq: Every day | SUBCUTANEOUS | Status: DC

## 2016-10-18 MED ORDER — MORPHINE SULFATE (PF) 4 MG/ML IV SOLN
1.0000 mg | INTRAVENOUS | Status: DC | PRN
  Administered 2016-10-20 – 2016-10-22 (×2): 1 mg via INTRAVENOUS
  Filled 2016-10-18 (×2): qty 1

## 2016-10-18 MED ORDER — ONDANSETRON HCL 4 MG PO TABS: 4.0000 mg | ORAL_TABLET | Freq: Four times a day (QID) | ORAL | Status: DC | PRN

## 2016-10-18 MED ORDER — SODIUM CHLORIDE 0.9 % IV BOLUS (SEPSIS)
1000.0000 mL | Freq: Once | INTRAVENOUS | Status: AC
  Administered 2016-10-18: 1000 mL via INTRAVENOUS

## 2016-10-18 MED ORDER — ONDANSETRON HCL 4 MG/2ML IJ SOLN
4.0000 mg | Freq: Four times a day (QID) | INTRAMUSCULAR | Status: DC | PRN
  Administered 2016-10-21: 4 mg via INTRAVENOUS
  Filled 2016-10-18: qty 2

## 2016-10-18 MED ORDER — SODIUM CHLORIDE 0.9% FLUSH
3.0000 mL | Freq: Two times a day (BID) | INTRAVENOUS | Status: DC
  Administered 2016-10-19 – 2016-10-23 (×7): 3 mL via INTRAVENOUS

## 2016-10-18 MED ORDER — ACETAMINOPHEN 325 MG PO TABS: 650.0000 mg | ORAL_TABLET | Freq: Four times a day (QID) | ORAL | Status: DC | PRN

## 2016-10-18 MED ORDER — CALCITONIN (SALMON) 200 UNIT/ML IJ SOLN
100.0000 [IU] | Freq: Once | INTRAMUSCULAR | Status: AC
Start: 2016-10-18 — End: 2016-10-18
  Administered 2016-10-18: 100 [IU] via INTRAMUSCULAR
  Filled 2016-10-18: qty 0.5

## 2016-10-18 MED ORDER — ACETAMINOPHEN 650 MG RE SUPP: 650.0000 mg | Freq: Four times a day (QID) | RECTAL | Status: DC | PRN

## 2016-10-18 NOTE — ED Triage Notes (Signed)
Pt arrived Via EMS from home and was recently discharged from SNF. Pt has new onset increased confusion from baseline, Pt is Alert to self . Per EMS pt is unable to identify family members Behavior has been less active than her baseline. Per EMS/Family that pt was receiving other residence medication at [facility].  O2 sat 92%, HR 110-118

## 2016-10-18 NOTE — H&P (Signed)
History and Physical    Kristin [name] QIH:[id number] DOB: 1930/12/29 DOA: 10/18/2016  Referring MD/NP/PA:   PCP: [name], MD   Patient coming from:  The patient is coming from home.  At baseline, pt is partially dependent for most of ADL.  Chief Complaint: AMS  HPI: Kristin [name] is a 83 y.o. female with medical history significant of hypertension, hyperlipidemia, vertigo, depression, GI bleeding, chronic back pain, chronic kidney disease-stage III, who presents with altered mental status.  Per patient's daughter, patient was recently discharged from a nursing home. She become confused in the past 3 days. She is less active than her baseline. Per her daugters, pt had fall on 10/02/16 in SNF, and was seen in ED, had negative CT-head, Ct-C spin and X-ray of left shoulder, but pt still has pain in the left shoulder and mild swelling. Per her daughter, pt dose not seem to have chest pain, SOB, cough, fever, chills, nausea, vomiting, diarrhea, abdominal pain. Pt dose not have unilateral weakness, facial droop or slurred speech.  ED Course: pt was found to have calcium>15, WBC 8.7, lactic acid 1.01, worsening renal function, tachycardia, normal temperature, O2 saturation 95% on room air, pending UA. Negative X-ray of left shoulder. CXR showed possible pulmonary interstitial edema and more confluent opacity at the right lung base.  Pt is admitted to tele bed as inpt.  Review of Systems: Could not be reviewed accurately due to altered mental status.  Allergy:  Allergies  Allergen Reactions  . Alendronate Nausea Only and Other (See Comments)    Hip pain  . Penicillins Rash    Has patient had a PCN reaction causing immediate rash, facial/tongue/throat swelling, SOB or lightheadedness with hypotension: no Has patient had a PCN reaction causing severe rash involving mucus membranes or skin necrosis: no Has patient had a PCN reaction that required hospitalization no Has patient had a  PCN reaction occurring within the last 10 years:no If all of the above answers are "NO", then may proceed with Cephalosporin use.    . Sulfonamide Derivatives Itching and Rash    Past Medical History:  Diagnosis Date  . Allergy   . Arthritis   . Chronic kidney disease (CKD), stage III (moderate)    Archie Endo 09/23/2016  . Depression    Risperdal "twice/day" (09/23/2016)  . Glaucoma   . Hiatal hernia   . Hypertension   . Intractable back pain    /notes 09/23/2016  . Obesity   . Osteoporosis    vertebral fracture in 2012  . Upper GI bleed 04/2004   secondary to Mallory-Weiss tear/notes 12/28/2010  . Vertigo     Past Surgical History:  Procedure Laterality Date  . ABDOMINAL HYSTERECTOMY  1974   partial  . APPENDECTOMY     as a child/notes 12/28/2010  . BACK SURGERY    . BREAST BIOPSY Left 1999   Archie Endo 12/28/2010  . CATARACT EXTRACTION, BILATERAL Bilateral 2000s  . ESOPHAGOGASTRODUODENOSCOPY  04/2004   Archie Endo 12/28/2010  . KYPHOPLASTY  05/2011   T10/notes 05/25/2011  . LAPAROSCOPIC CHOLECYSTECTOMY  10/2007   Archie Endo 12/14/2010    Social History:  reports that she has never smoked. She has never used smokeless tobacco. She reports that she drinks alcohol. She reports that she does not use drugs.  Family History:  Family History  Problem Relation Age of Onset  . Hypertension Mother   . Osteoporosis Mother   . CVA Mother   . Cancer Neg Hx  Prior to Admission medications   Medication Sig Start Date End Date Taking? Authorizing Provider  amLODipine (NORVASC) 5 MG tablet Take 1 tablet (5 mg total) by mouth daily. 02/19/14   Cherre Robins, PharmD  aspirin 81 MG tablet Take 81 mg by mouth daily.    Historical Provider, MD  bisacodyl (DULCOLAX) 10 MG suppository Place 1 suppository (10 mg total) rectally daily as needed for moderate constipation. 06/21/16   Debbe Odea, MD  ezetimibe (ZETIA) 10 MG tablet Take 1 tablet (10 mg total) by mouth daily. 04/05/13   [name], MD    ibandronate (BONIVA) 150 MG tablet Take 1 tablet (150 mg total) by mouth every 30 (thirty) days. Take in the morning with a full glass of water, on an empty stomach, and do not take anything else by mouth or lie down for the next 30 min. 02/19/14   Cherre Robins, PharmD  losartan-hydrochlorothiazide (HYZAAR) 50-12.5 MG tablet Take 1 tablet by mouth daily. 08/18/16   Historical Provider, MD  meclizine (ANTIVERT) 25 MG tablet TAKE ONE TABLET BY MOUTH THREE TIMES DAILY AS NEEDED Patient taking differently: Take 25 mg by mouth 3 (three) times daily as needed for dizziness. TAKE ONE TABLET BY MOUTH THREE TIMES DAILY AS NEEDED 06/20/14   Chipper Herb, MD  Multiple Vitamins-Minerals (CENTRUM SILVER PO) Take 1 tablet by mouth daily.     Historical Provider, MD  oxyCODONE-acetaminophen (PERCOCET/ROXICET) 5-325 MG tablet Take 1 tablet by mouth every 4 (four) hours. 09/27/16   Florencia Reasons, MD  polyethylene glycol powder (GLYCOLAX/MIRALAX) powder Take 17 g by mouth daily. 07/05/16   Historical Provider, MD  RESTASIS 0.05 % ophthalmic emulsion Place 1 drop into both eyes daily.  01/31/13   Historical Provider, MD  risperiDONE (RISPERDAL) 0.5 MG tablet Take 1 tablet (0.5 mg total) by mouth 2 (two) times daily. 06/21/16   Debbe Odea, MD    Physical Exam: Vitals:   10/18/16 1940 10/18/16 2251  BP: 100/88 (!) 153/66  Pulse: 110 98  Resp: 18 16  Temp: 98.4 F (36.9 C) 99.1 F (37.3 C)  TempSrc: Oral Oral  SpO2: 95% 95%  Weight:  63.1 kg (139 lb 1.8 oz)  Height:  5' (1.524 m)   General: Not in acute distress. Dry mucus and membrane HEENT:       Eyes: PERRL, EOMI, no scleral icterus.       ENT: No discharge from the ears and nose, no pharynx injection, no tonsillar enlargement.        Neck: No JVD, no bruit, no mass felt. Heme: No neck lymph node enlargement. Cardiac: S1/S2, RRR, No murmurs, No gallops or rubs. Respiratory:  No rales, wheezing, rhonchi or rubs. GI: Soft, nondistended, nontender, no rebound  pain, no organomegaly, BS present. GU: No hematuria Ext: No pitting leg edema bilaterally. 2+DP/PT pulse bilaterally. Musculoskeletal: No joint deformities, No joint redness or warmth, no limitation of ROM in spin. Skin: No rashes.  Neuro: confused, recognize her daughters, not oriented to time and place, cranial nerves II-XII grossly intact, moves all extremities. Psych: Patient is not psychotic.  Labs on Admission: I have personally reviewed following labs and imaging studies  CBC:  Recent Labs Lab 10/18/16 1942  WBC 8.7  NEUTROABS 4.5  HGB 8.0*  HCT 23.1*  MCV 103.1*  PLT 034   Basic Metabolic Panel:  Recent Labs Lab 10/18/16 1942  NA 142  K 3.7  CL 106  CO2 29  GLUCOSE 113*  BUN 28*  CREATININE 1.89*  CALCIUM >15.0*   GFR: Estimated Creatinine Clearance: 18.4 mL/min (by C-G formula based on SCr of 1.89 mg/dL (H)). Liver Function Tests:  Recent Labs Lab 10/18/16 1942  AST 24  ALT 10*  ALKPHOS 150*  BILITOT 1.1  PROT 6.3*  ALBUMIN 3.7   No results for input(s): LIPASE, AMYLASE in the last 168 hours. No results for input(s): AMMONIA in the last 168 hours. Coagulation Profile: No results for input(s): INR, PROTIME in the last 168 hours. Cardiac Enzymes: No results for input(s): CKTOTAL, CKMB, CKMBINDEX, TROPONINI in the last 168 hours. BNP (last 3 results) No results for input(s): PROBNP in the last 8760 hours. HbA1C: No results for input(s): HGBA1C in the last 72 hours. CBG: No results for input(s): GLUCAP in the last 168 hours. Lipid Profile: No results for input(s): CHOL, HDL, LDLCALC, TRIG, CHOLHDL, LDLDIRECT in the last 72 hours. Thyroid Function Tests: No results for input(s): TSH, T4TOTAL, FREET4, T3FREE, THYROIDAB in the last 72 hours. Anemia Panel: No results for input(s): VITAMINB12, FOLATE, FERRITIN, TIBC, IRON, RETICCTPCT in the last 72 hours. Urine analysis:    Component Value Date/Time   COLORURINE YELLOW 09/22/2016 2113    APPEARANCEUR CLEAR 09/22/2016 2113   LABSPEC 1.011 09/22/2016 2113   PHURINE 6.0 09/22/2016 2113   GLUCOSEU NEGATIVE 09/22/2016 2113   HGBUR NEGATIVE 09/22/2016 2113   BILIRUBINUR NEGATIVE 09/22/2016 2113   KETONESUR NEGATIVE 09/22/2016 2113   PROTEINUR NEGATIVE 09/22/2016 2113   UROBILINOGEN 0.2 10/18/2007 0945   NITRITE NEGATIVE 09/22/2016 2113   LEUKOCYTESUR NEGATIVE 09/22/2016 2113   Sepsis Labs: @LABRCNTIP (procalcitonin:4,lacticidven:4) )No results found for this or any previous visit (from the past 240 hour(s)).   Radiological Exams on Admission: Dg Chest 2 View  Result Date: 10/18/2016 CLINICAL DATA:  Initial evaluation for acute altered mental status, recent fall. EXAM: CHEST  2 VIEW COMPARISON:  Prior radiograph from 10/02/2016. FINDINGS: Moderate cardiomegaly, stable. Mediastinal silhouette within normal limits. Aortic atherosclerosis with tortuosity the intrathoracic aorta. Lungs are hypoinflated. Diffusely increased vascular congestion with interstitial prominence, suggesting pulmonary interstitial congestion/ edema. Increased opacity at the right lung base may reflect atelectasis/ edema or infiltrate. No other focal infiltrates. No pneumothorax. No acute osseous abnormality. Patient status post vertebral augmentation at T9. Degenerative changes noted about the shoulders bilaterally. IMPRESSION: 1. Stable cardiomegaly with diffuse increased vascular congestion and interstitial prominence, suggesting pulmonary interstitial edema. 2. More confluent opacity at the right lung base. While this finding may reflect atelectasis and/or edema/congestion, possible infiltrate could also be considered. 3. Aortic atherosclerosis. Electronically Signed   By: Jeannine Boga M.D.   On: 10/18/2016 22:02   Dg Shoulder Left  Result Date: 10/18/2016 CLINICAL DATA:  Initial evaluation for acute pain, status post recent fall. EXAM: LEFT SHOULDER - 2+ VIEW COMPARISON:  None available. FINDINGS: No  acute fracture or dislocation. Humeral head in normal alignment with the glenoid. Degenerative osteophytic spurring present at the inferior aspect of the humeral head. AC joint is approximated. No periarticular calcification. Congestive changes noted within the partially visualized left lung. IMPRESSION: 1. No acute fracture or dislocation about the left shoulder. 2. Degenerative osteoarthrosis about the left glenohumeral joint. Electronically Signed   By: Jeannine Boga M.D.   On: 10/18/2016 22:04     EKG: Independently reviewed. Sinus rhythm, first-degree AV block, QTC 406, LAD, nonspecific T-wave change.   Assessment/Plan Principal Problem:   Acute encephalopathy Active Problems:   Acute renal failure superimposed on stage 3 chronic kidney disease ([place])  HLD (hyperlipidemia)   HTN (hypertension)   Vertigo   Anemia   Back pain   Hypercalcemia   Acute encephalopathy:  Etiology is not clear. Does not have a sinus stroke. Patient had fall recently, but had negative CT head and CT of C-spine. Calcium is elevated >15 which is likely the reason for her altered mental status. The etiology for hypercalcemia is not clear. Patient had mild hypercalcemia in previous admission, which was thought to be due to multifactorial etiology, including dehydration and ssing vitamin D supplement at home. Her calcitonin nasal spry and vitamin D supplement were discontinued. Differential diagnosis includes dysfunctional for parathyroid gland, HCTZ side effects (patient is on Hyzaar) and malignancy.  -will admit to tele bed as inpt -check PTH and PTH related peptide, vitamin D. 25-->if all negative, may consider CT-chest to r/o maglinancy -give one dose of calcitonin 100 U now. - hydrate the patient on IV fluids with normal saline and 125 cc per hour  - hold Hyzarr - May consider renal or endocrinology consultation if no clear etiology for hypercalcemia could be found.  Hypercalcemia: -See  above  AoCKD-III: Baseline Cre is 1.0, pt's Cre is 1.89 on admission. Likely due to multifactorial etiology, including hypercalcemia, prerenal secondary to dehydration and continuation of ACEI,diruetics - IVF as above - Check FeUrea - Follow up renal function by BMP - Hold hyzarr  HLD:  -hold Zetia due to AMS  HTN: -hold oral Bp meds -IV hydralazine when necessary  Vertigo: -Hold Meclizen  Anemia: Hgb 8.0 on 09/27/16-->8.0 today. -check anemia panel  DVT ppx: SQ Lovenox Code Status: Full code Family Communication:  Yes, patient's 2 daughers at bed side Disposition Plan:  Anticipate discharge back to previous home environment Consults called:  none Admission status:  Inpatient/tele     Date of Service 10/18/2016    Ivor Costa [facility] Pager [phone]  If 7PM-7AM, please contact night-coverage [email or web address] Password [email or web address] 10/18/2016, 11:56 PM

## 2016-10-18 NOTE — ED Notes (Signed)
Bed: WI09 Expected date:  Expected time:  Means of arrival:  Comments: 83 yo f ams

## 2016-10-18 NOTE — ED Notes (Addendum)
Pt is verbally responsive, denies pain is confused and states " something is wrong" Pt denies pain at this time. Pt Oxygen saturation was 90-93 placed pt on 2 lpm of oxygen.

## 2016-10-18 NOTE — ED Provider Notes (Signed)
[facility] DEPT Provider Note   CSN: [id number] Arrival date & time: 10/18/16  1909     History   Chief Complaint No chief complaint on file.   HPI Kristin [name] is a 83 y.o. female.  HPI   Confusion, has not been eating or drinking a lot over the last 2 days Friday, was doing well, but since then has become less communicative, confused, not eating or drinking Severe fatigue Yesterday had PT, did ok, but after that she was exhausted.  Normally she is alert, oriented x3, today, not recognizing her daughter, couldn't communicate what she needed, difficult to understand  No vomiting, no fevers, no cough, black/bloody stools, diarrhea  Was in SNF until 2 weeks ago  While she was in the facility (discharged 2/13) was on oxycodone for pain which was stopped.  Started tramadol.   While she was in SNF, she received wrong medicine/dizzy/hallucinating Has not been receiving calcium or vitamin D at SNF or in last 2 weeks  Past Medical History:  Diagnosis Date  . Allergy   . Arthritis   . Chronic kidney disease (CKD), stage III (moderate)    Archie Endo 09/23/2016  . Depression    Risperdal "twice/day" (09/23/2016)  . Glaucoma   . Hiatal hernia   . Hypertension   . Intractable back pain    /notes 09/23/2016  . Obesity   . Osteoporosis    vertebral fracture in 2012  . Upper GI bleed 04/2004   secondary to Mallory-Weiss tear/notes 12/28/2010  . Vertigo      Patient Active Problem List   Diagnosis Date Noted  . Acute encephalopathy 10/18/2016  . Hypercalcemia 10/18/2016  . Fall   . Anemia 09/23/2016  . Sepsis ([place]) 09/23/2016  . Back pain 09/23/2016  . Intractable back pain 06/20/2016  . Other malaise and fatigue 11/26/2013  . Vertigo   . Obesity   . Arthritis of knee, left 07/09/2013  . Need for prophylactic vaccination and inoculation against influenza 07/09/2013  . Acute renal failure superimposed on stage 3 chronic kidney disease ([place]) 01/01/2013  . Osteoporosis  with pathological fracture 01/01/2013  . HLD (hyperlipidemia) 01/01/2013  . HTN (hypertension) 01/01/2013  . Chronic back pain 01/01/2013  . Vertebral compression fracture ([place]) 01/01/2013  . Seasonal allergic rhinitis 01/01/2013    Past Surgical History:  Procedure Laterality Date  . ABDOMINAL HYSTERECTOMY  1974   partial  . APPENDECTOMY     as a child/notes 12/28/2010  . BACK SURGERY    . BREAST BIOPSY Left 1999   Archie Endo 12/28/2010  . CATARACT EXTRACTION, BILATERAL Bilateral 2000s  . ESOPHAGOGASTRODUODENOSCOPY  04/2004   Archie Endo 12/28/2010  . KYPHOPLASTY  05/2011   T10/notes 05/25/2011  . LAPAROSCOPIC CHOLECYSTECTOMY  10/2007   Archie Endo 12/14/2010    OB History    No data available       Home Medications    Prior to Admission medications   Medication Sig Start Date End Date Taking? Authorizing Provider  amLODipine (NORVASC) 5 MG tablet Take 1 tablet (5 mg total) by mouth daily. 02/19/14  Yes Cherre Robins, PharmD  ezetimibe (ZETIA) 10 MG tablet Take 1 tablet (10 mg total) by mouth daily. 04/05/13  Yes [name], MD  losartan-hydrochlorothiazide (HYZAAR) 50-12.5 MG tablet Take 1 tablet by mouth daily. 08/18/16  Yes Historical Provider, MD  meclizine (ANTIVERT) 25 MG tablet TAKE ONE TABLET BY MOUTH THREE TIMES DAILY AS NEEDED Patient taking differently: Take 25 mg by mouth 3 (three) times daily  as needed for dizziness. TAKE ONE TABLET BY MOUTH THREE TIMES DAILY AS NEEDED 06/20/14  Yes Chipper Herb, MD  Multiple Vitamins-Minerals (CENTRUM SILVER PO) Take 1 tablet by mouth daily.    Yes Historical Provider, MD  oxyCODONE-acetaminophen (PERCOCET/ROXICET) 5-325 MG tablet Take 1 tablet by mouth every 4 (four) hours. 09/27/16  Yes Florencia Reasons, MD  polyethylene glycol powder (GLYCOLAX/MIRALAX) powder Take 17 g by mouth daily. 07/05/16  Yes Historical Provider, MD  RESTASIS 0.05 % ophthalmic emulsion Place 1 drop into both eyes daily.  01/31/13  Yes Historical Provider, MD  risperiDONE  (RISPERDAL) 0.5 MG tablet Take 1 tablet (0.5 mg total) by mouth 2 (two) times daily. 06/21/16  Yes Debbe Odea, MD  traMADol (ULTRAM) 50 MG tablet Take 50 mg by mouth every 6 (six) hours as needed.   Yes Historical Provider, MD  bisacodyl (DULCOLAX) 10 MG suppository Place 1 suppository (10 mg total) rectally daily as needed for moderate constipation. Patient not taking: Reported on 10/18/2016 06/21/16   Debbe Odea, MD  ibandronate (BONIVA) 150 MG tablet Take 1 tablet (150 mg total) by mouth every 30 (thirty) days. Take in the morning with a full glass of water, on an empty stomach, and do not take anything else by mouth or lie down for the next 30 min. 02/19/14   Cherre Robins, PharmD    Family History Family History  Problem Relation Age of Onset  . Hypertension Mother   . Osteoporosis Mother   . CVA Mother   . Cancer Neg Hx     Social History Social History  Substance Use Topics  . Smoking status: Never Smoker  . Smokeless tobacco: Never Used  . Alcohol use Yes     Comment: 09/23/2016 "glass of wine a few times/year"     Allergies   Alendronate; Penicillins; and Sulfonamide derivatives   Review of Systems Review of Systems  Constitutional: Negative for fever.  HENT: Negative for sore throat.   Eyes: Negative for visual disturbance.  Respiratory: Negative for cough and shortness of breath.   Cardiovascular: Negative for chest pain.  Gastrointestinal: Negative for abdominal pain.  Genitourinary: Negative for difficulty urinating.  Musculoskeletal: Positive for arthralgias. Negative for back pain and neck pain.  Skin: Negative for rash.  Neurological: Negative for syncope, weakness, numbness and headaches.     Physical Exam Updated Vital Signs BP (!) 153/66 (BP Location: Left Arm)   Pulse 98   Temp 99.1 F (37.3 C) (Oral)   Resp 16   Ht 5' (1.524 m)   Wt 139 lb 1.8 oz (63.1 kg)   SpO2 95%   BMI 27.17 kg/m   Physical Exam  Constitutional: She appears well-developed  and well-nourished. No distress.  HENT:  Head: Normocephalic and atraumatic.  Eyes: Conjunctivae and EOM are normal.  Neck: Normal range of motion.  Cardiovascular: Normal rate, regular rhythm, normal heart sounds and intact distal pulses.  Exam reveals no gallop and no friction rub.   No murmur heard. Pulmonary/Chest: Effort normal and breath sounds normal. No respiratory distress. She has no wheezes. She has no rales.  Abdominal: Soft. She exhibits no distension. There is no tenderness. There is no guarding.  Musculoskeletal: She exhibits no edema or tenderness.  Neurological: She is alert. She has normal strength. No sensory deficit. Coordination (coordination normal, some difficulty understandingdirections) normal.  Oriented to self, does not answer location  Skin: Skin is warm and dry. No rash noted. She is not diaphoretic. No erythema.  Nursing note and vitals reviewed.    ED Treatments / Results  Labs (all labs ordered are listed, but only abnormal results are displayed) Labs Reviewed  CBC WITH DIFFERENTIAL/PLATELET - Abnormal; Notable for the following:       Result Value   RBC 2.24 (*)    Hemoglobin 8.0 (*)    HCT 23.1 (*)    MCV 103.1 (*)    MCH 35.7 (*)    Monocytes Absolute 1.5 (*)    All other components within normal limits  COMPREHENSIVE METABOLIC PANEL - Abnormal; Notable for the following:    Glucose, Bld 113 (*)    BUN 28 (*)    Creatinine, Ser 1.89 (*)    Calcium >15.0 (*)    Total Protein 6.3 (*)    ALT 10 (*)    Alkaline Phosphatase 150 (*)    GFR calc non Af Amer 23 (*)    GFR calc Af Amer 27 (*)    All other components within normal limits  BASIC METABOLIC PANEL - Abnormal; Notable for the following:    Potassium 3.3 (*)    BUN 27 (*)    Creatinine, Ser 1.65 (*)    Calcium 14.4 (*)    GFR calc non Af Amer 27 (*)    GFR calc Af Amer 32 (*)    All other components within normal limits  URINE CULTURE  URINALYSIS, ROUTINE W REFLEX MICROSCOPIC    CALCIUM, IONIZED  PARATHYROID HORMONE, INTACT (NO CA)  CALCIUM, URINE, RANDOM  CREATININE, URINE, RANDOM  UREA NITROGEN, URINE  BASIC METABOLIC PANEL  VITAMIN D 25 HYDROXY (VIT D DEFICIENCY, FRACTURES)  PTH-RELATED PEPTIDE  I-STAT CG4 LACTIC ACID, ED    EKG  EKG Interpretation None       Radiology Dg Chest 2 View  Result Date: 10/18/2016 CLINICAL DATA:  Initial evaluation for acute altered mental status, recent fall. EXAM: CHEST  2 VIEW COMPARISON:  Prior radiograph from 10/02/2016. FINDINGS: Moderate cardiomegaly, stable. Mediastinal silhouette within normal limits. Aortic atherosclerosis with tortuosity the intrathoracic aorta. Lungs are hypoinflated. Diffusely increased vascular congestion with interstitial prominence, suggesting pulmonary interstitial congestion/ edema. Increased opacity at the right lung base may reflect atelectasis/ edema or infiltrate. No other focal infiltrates. No pneumothorax. No acute osseous abnormality. Patient status post vertebral augmentation at T9. Degenerative changes noted about the shoulders bilaterally. IMPRESSION: 1. Stable cardiomegaly with diffuse increased vascular congestion and interstitial prominence, suggesting pulmonary interstitial edema. 2. More confluent opacity at the right lung base. While this finding may reflect atelectasis and/or edema/congestion, possible infiltrate could also be considered. 3. Aortic atherosclerosis. Electronically Signed   By: Jeannine Boga M.D.   On: 10/18/2016 22:02   Dg Shoulder Left  Result Date: 10/18/2016 CLINICAL DATA:  Initial evaluation for acute pain, status post recent fall. EXAM: LEFT SHOULDER - 2+ VIEW COMPARISON:  None available. FINDINGS: No acute fracture or dislocation. Humeral head in normal alignment with the glenoid. Degenerative osteophytic spurring present at the inferior aspect of the humeral head. AC joint is approximated. No periarticular calcification. Congestive changes noted within  the partially visualized left lung. IMPRESSION: 1. No acute fracture or dislocation about the left shoulder. 2. Degenerative osteoarthrosis about the left glenohumeral joint. Electronically Signed   By: Jeannine Boga M.D.   On: 10/18/2016 22:04    Procedures Procedures (including critical care time)  Medications Ordered in ED Medications  cycloSPORINE (RESTASIS) 0.05 % ophthalmic emulsion 1 drop (1 drop Both Eyes Not Given 10/18/16 2347)  hydrALAZINE (APRESOLINE) injection 5 mg (not administered)  enoxaparin (LOVENOX) injection 40 mg (40 mg Subcutaneous Not Given 10/18/16 2347)  sodium chloride flush (NS) 0.9 % injection 3 mL (3 mLs Intravenous Not Given 10/18/16 2347)  acetaminophen (TYLENOL) tablet 650 mg (not administered)    Or  acetaminophen (TYLENOL) suppository 650 mg (not administered)  ondansetron (ZOFRAN) tablet 4 mg (not administered)    Or  ondansetron (ZOFRAN) injection 4 mg (not administered)  morphine 4 MG/ML injection 1 mg (not administered)  0.9 %  sodium chloride infusion ( Intravenous New Bag/Given 10/18/16 2249)  sodium chloride 0.9 % bolus 1,000 mL (1,000 mLs Intravenous New Bag/Given 10/18/16 2204)  calcitonin (MIACALCIN) injection 100 Units (100 Units Intramuscular Given 10/18/16 2346)     Initial Impression / Assessment and Plan / ED Course  I have reviewed the triage vital signs and the nursing notes.  Pertinent labs & imaging results that were available during my care of the patient were reviewed by me and considered in my medical decision making (see chart for details).     83yo femalewith hx of CKD, osteoporosis, depression, htn, hlpd, recent discharge from SNF 2 wk ago presents with concern for confusion, generalized weakness, decreased appetite for 2 days. No sign of infection by hx or exam.  Nonfocal neurologic exam, doubt CVA. Labs show hypercalcemia. Given 1L NS.  Ordered ionized Ca, PTH. Unclear etiology at this time. Hospitalist to admit.  Final  Clinical Impressions(s) / ED Diagnoses   Final diagnoses:  Fall  Hypercalcemia    New Prescriptions Current Discharge Medication List       Gareth Morgan, MD 10/19/16 [phone]

## 2016-10-18 NOTE — ED Notes (Signed)
Dr. Billy Fischer has been mad aware of Critical calcium > 15.

## 2016-10-19 ENCOUNTER — Inpatient Hospital Stay (HOSPITAL_COMMUNITY): Payer: Medicare Other

## 2016-10-19 ENCOUNTER — Encounter (HOSPITAL_COMMUNITY): Payer: Self-pay

## 2016-10-19 LAB — BASIC METABOLIC PANEL
Anion gap: 8 (ref 5–15)
Anion gap: 10 (ref 5–15)
BUN: 26 mg/dL — AB (ref 6–20)
BUN: 27 mg/dL — AB (ref 6–20)
CALCIUM: 13.7 mg/dL — AB (ref 8.9–10.3)
CO2: 26 mmol/L (ref 22–32)
CO2: 27 mmol/L (ref 22–32)
CREATININE: 1.54 mg/dL — AB (ref 0.44–1.00)
CREATININE: 1.65 mg/dL — AB (ref 0.44–1.00)
Calcium: 14.4 mg/dL (ref 8.9–10.3)
Chloride: 106 mmol/L (ref 101–111)
Chloride: 110 mmol/L (ref 101–111)
GFR calc Af Amer: 32 mL/min — ABNORMAL LOW (ref >60)
GFR calc Af Amer: 35 mL/min — ABNORMAL LOW (ref >60)
GFR calc non Af Amer: 27 mL/min — ABNORMAL LOW (ref >60)
GFR, EST NON AFRICAN AMERICAN: 30 mL/min — AB (ref >60)
GLUCOSE: 94 mg/dL (ref 65–99)
Glucose, Bld: 114 mg/dL — ABNORMAL HIGH (ref 65–99)
POTASSIUM: 3.4 mmol/L — AB (ref 3.5–5.1)
Potassium: 3.3 mmol/L — ABNORMAL LOW (ref 3.5–5.1)
SODIUM: 146 mmol/L — AB (ref 135–145)
Sodium: 141 mmol/L (ref 135–145)

## 2016-10-19 LAB — CREATININE, URINE, RANDOM: CREATININE, URINE: 37.75 mg/dL

## 2016-10-19 LAB — URINALYSIS, ROUTINE W REFLEX MICROSCOPIC
BILIRUBIN URINE: NEGATIVE
Glucose, UA: NEGATIVE mg/dL
HGB URINE DIPSTICK: NEGATIVE
KETONES UR: NEGATIVE mg/dL
Leukocytes, UA: NEGATIVE
Nitrite: NEGATIVE
PH: 7 (ref 5.0–8.0)
Protein, ur: NEGATIVE mg/dL
SPECIFIC GRAVITY, URINE: 1.008 (ref 1.005–1.030)

## 2016-10-19 LAB — GLUCOSE, CAPILLARY: Glucose-Capillary: 108 mg/dL — ABNORMAL HIGH (ref 65–99)

## 2016-10-19 MED ORDER — ENOXAPARIN SODIUM 30 MG/0.3ML ~~LOC~~ SOLN
30.0000 mg | Freq: Every day | SUBCUTANEOUS | Status: DC
  Administered 2016-10-19 – 2016-10-23 (×5): 30 mg via SUBCUTANEOUS
  Filled 2016-10-19 (×5): qty 0.3

## 2016-10-19 MED ORDER — SODIUM CHLORIDE 0.9 % IV SOLN
30.0000 meq | Freq: Once | INTRAVENOUS | Status: AC
  Administered 2016-10-19: 30 meq via INTRAVENOUS
  Filled 2016-10-19: qty 15

## 2016-10-19 MED ORDER — AMLODIPINE BESYLATE 5 MG PO TABS
5.0000 mg | ORAL_TABLET | Freq: Every day | ORAL | Status: DC
  Administered 2016-10-19 – 2016-10-26 (×8): 5 mg via ORAL
  Filled 2016-10-19 (×8): qty 1

## 2016-10-19 MED ORDER — FUROSEMIDE 10 MG/ML IJ SOLN
40.0000 mg | Freq: Once | INTRAMUSCULAR | Status: AC
  Administered 2016-10-19: 40 mg via INTRAVENOUS
  Filled 2016-10-19: qty 4

## 2016-10-19 MED ORDER — CALCITONIN (SALMON) 200 UNIT/ML IJ SOLN
250.0000 [IU] | Freq: Two times a day (BID) | INTRAMUSCULAR | Status: AC
  Administered 2016-10-19 – 2016-10-20 (×4): 250 [IU] via SUBCUTANEOUS
  Filled 2016-10-19 (×4): qty 1.25

## 2016-10-19 NOTE — Progress Notes (Signed)
PROGRESS NOTE  Kristin [name] CHY:[id number] DOB: [birth year] DOA: 10/18/2016 PCP: [name], MD  HPI/Recap of past 24 hours:  Remain confused, but  Calm and cooperative, daughter at bedside  Assessment/Plan: Principal Problem:   Acute encephalopathy Active Problems:   Acute renal failure superimposed on stage 3 chronic kidney disease ([place])   HLD (hyperlipidemia)   HTN (hypertension)   Vertigo   Anemia   Back pain   Hypercalcemia  Hypercalcemia: unclear etiology PTH/ PTH related peptide, vitamin D level, urine calcium, spep/upep pending On hydration , calcitonin , ca improving, consider bisphosphonate once cr normalized  Metabolic encephalopathy:  Likely from hypercalcemia, ua no infection Recent ct head a two weeks ago , no acute findings, consider mri brain If mental status dose not improve with correcting calcium.   AKI on CKDIII, likely from dehydration, cr improved with hydration, ivf held due to pleural effusion, will check spep/upepe   Anemia, macrocytic, recent b12/folate/iron study wnl , tsh pending, will check spep/upep  HLD:  -hold Zetia due to AMS  HTN: -hold oral Bp meds -IV hydralazine when necessary  Vertigo: -Hold Meclizen  FTT, family states can provide 24/7 care  Code Status: full  Family Communication: patient and daughter at bedside  Disposition Plan: home with home health in a few days   Consultants:  none  Procedures:  none  Antibiotics:  none   Objective: BP (!) 168/59 (BP Location: Left Arm)   Pulse 99   Temp 99.1 F (37.3 C) (Oral)   Resp 18   Ht 5' (1.524 m)   Wt 63.1 kg (139 lb 1.8 oz)   SpO2 94%   BMI 27.17 kg/m   Intake/Output Summary (Last 24 hours) at 10/19/16 0844 Last data filed at 10/19/16 0200  Gross per 24 hour  Intake           397.92 ml  Output                0 ml  Net           397.92 ml   Filed Weights   10/18/16 2251  Weight: 63.1 kg (139 lb 1.8 oz)    Exam:   General:   Frail, confused, but NAD  Cardiovascular: RRR  Respiratory: CTABL  Abdomen: Soft/ND/NT, positive BS  Musculoskeletal: No Edema  Neuro: confused, following commands  Data Reviewed: Basic Metabolic Panel:  Recent Labs Lab 10/18/16 1942 10/19/16 0045 10/19/16 0637  NA 142 141 146*  K 3.7 3.3* 3.4*  CL 106 106 110  CO2 29 27 26   GLUCOSE 113* 94 114*  BUN 28* 27* 26*  CREATININE 1.89* 1.65* 1.54*  CALCIUM >15.0* 14.4* 13.7*   Liver Function Tests:  Recent Labs Lab 10/18/16 1942  AST 24  ALT 10*  ALKPHOS 150*  BILITOT 1.1  PROT 6.3*  ALBUMIN 3.7   No results for input(s): LIPASE, AMYLASE in the last 168 hours. No results for input(s): AMMONIA in the last 168 hours. CBC:  Recent Labs Lab 10/18/16 1942  WBC 8.7  NEUTROABS 4.5  HGB 8.0*  HCT 23.1*  MCV 103.1*  PLT 173   Cardiac Enzymes:   No results for input(s): CKTOTAL, CKMB, CKMBINDEX, TROPONINI in the last 168 hours. BNP (last 3 results) No results for input(s): BNP in the last 8760 hours.  ProBNP (last 3 results) No results for input(s): PROBNP in the last 8760 hours.  CBG:  Recent Labs Lab 10/19/16 0742  GLUCAP 108*  No results found for this or any previous visit (from the past 240 hour(s)).   Studies: Dg Chest 2 View  Result Date: 10/18/2016 CLINICAL DATA:  Initial evaluation for acute altered mental status, recent fall. EXAM: CHEST  2 VIEW COMPARISON:  Prior radiograph from 10/02/2016. FINDINGS: Moderate cardiomegaly, stable. Mediastinal silhouette within normal limits. Aortic atherosclerosis with tortuosity the intrathoracic aorta. Lungs are hypoinflated. Diffusely increased vascular congestion with interstitial prominence, suggesting pulmonary interstitial congestion/ edema. Increased opacity at the right lung base may reflect atelectasis/ edema or infiltrate. No other focal infiltrates. No pneumothorax. No acute osseous abnormality. Patient status post vertebral augmentation at T9.  Degenerative changes noted about the shoulders bilaterally. IMPRESSION: 1. Stable cardiomegaly with diffuse increased vascular congestion and interstitial prominence, suggesting pulmonary interstitial edema. 2. More confluent opacity at the right lung base. While this finding may reflect atelectasis and/or edema/congestion, possible infiltrate could also be considered. 3. Aortic atherosclerosis. Electronically Signed   By: Jeannine Boga M.D.   On: 10/18/2016 22:02   Dg Shoulder Left  Result Date: 10/18/2016 CLINICAL DATA:  Initial evaluation for acute pain, status post recent fall. EXAM: LEFT SHOULDER - 2+ VIEW COMPARISON:  None available. FINDINGS: No acute fracture or dislocation. Humeral head in normal alignment with the glenoid. Degenerative osteophytic spurring present at the inferior aspect of the humeral head. AC joint is approximated. No periarticular calcification. Congestive changes noted within the partially visualized left lung. IMPRESSION: 1. No acute fracture or dislocation about the left shoulder. 2. Degenerative osteoarthrosis about the left glenohumeral joint. Electronically Signed   By: Jeannine Boga M.D.   On: 10/18/2016 22:04    Scheduled Meds: . cycloSPORINE  1 drop Both Eyes Daily  . enoxaparin (LOVENOX) injection  40 mg Subcutaneous QHS  . sodium chloride flush  3 mL Intravenous Q12H    Continuous Infusions: . sodium chloride 125 mL/hr at 10/19/16 1572     Time spent: 97mns  Velicia Dejager MD, PhD  [facility] Pager [phone] If 7PM-7AM, please contact night-coverage at [email or web address], password [email or web address]3/02/2017, 8:44 AM  LOS: 1 day

## 2016-10-19 NOTE — Progress Notes (Signed)
CRITICAL VALUE ALERT  Critical value received:  Ca 13.7  Date of notification:  10/19/16  Time of notification: 0750  Critical value read back:yes  Nurse who received alert:  G. Gerilyn Nestle RN  MD notified (1st page):  Erlinda Hong  Time of first page: 0802  MD notified (2nd page): Erlinda Hong  Time of second page: 0826  Responding MD:  Erlinda Hong  Time MD responded: [phone]

## 2016-10-19 NOTE — Progress Notes (Signed)
Called to pt room by pt dtr. Pt had coughed up mashed potatoes she was eating. Dtr states "It seemed like she didn't want to swallow it."  When pt asked directly if she is nauseated she begins rambling about unrelated events and people.  When abd is palpated she does not grimace or pull away.  She does not appear to be actively nauseated.  Dtr relates that pt had an episode a few days ago where she persistently chewed and chewed her salmon but wouldn't swallow it.  Dr Erlinda Hong on unit and informed of above.  Order for SLP eval noted.

## 2016-10-19 NOTE — Progress Notes (Signed)
External urinary collector soiled, missed 1 episode of urine due to incontinence. NP on-call paged to see if we should continue the 24 hour urine. Told to continue collecting overnight and notify MD in the morning. Will continue to monitor closely.

## 2016-10-19 NOTE — Progress Notes (Signed)
Pt dtr reports that pt began c/o throat pain after drinking ginger ale.  Upon assessing pt, a hard, bony prominence noted at pt left throat/clavicle area.  Pt does not appear to have any tenderness to palpation. Dtr present and states this is new, she had not noticed this bony lump before and expresses concern.  Dr Erlinda Hong paged and made aware. New orders noted. Pt and dtr updated.

## 2016-10-19 NOTE — Progress Notes (Signed)
CRITICAL VALUE ALERT  Critical value received:  Calcium 14.4  Date of notification:  10/19/2016  Time of notification:  4917  Critical value read back:Yes.    Nurse who received alert:  Haywood Lasso  MD notified (1st page):  K. Schorr  Time of first page:  0157  MD notified (2nd page):  Time of second page:  Responding MD:   Time MD responded:  Will carry out any new orders placed

## 2016-10-20 DIAGNOSIS — N179 Acute kidney failure, unspecified: Secondary | ICD-10-CM

## 2016-10-20 DIAGNOSIS — G9340 Encephalopathy, unspecified: Secondary | ICD-10-CM

## 2016-10-20 DIAGNOSIS — T148XXA Other injury of unspecified body region, initial encounter: Secondary | ICD-10-CM

## 2016-10-20 DIAGNOSIS — D892 Hypergammaglobulinemia, unspecified: Secondary | ICD-10-CM

## 2016-10-20 DIAGNOSIS — D649 Anemia, unspecified: Secondary | ICD-10-CM

## 2016-10-20 LAB — CALCIUM, URINE, RANDOM: Calcium, Ur: 17.8 mg/dL

## 2016-10-20 LAB — GLUCOSE, CAPILLARY: Glucose-Capillary: 122 mg/dL — ABNORMAL HIGH (ref 65–99)

## 2016-10-20 LAB — BASIC METABOLIC PANEL
ANION GAP: 10 (ref 5–15)
BUN: 26 mg/dL — ABNORMAL HIGH (ref 6–20)
CHLORIDE: 112 mmol/L — AB (ref 101–111)
CO2: 25 mmol/L (ref 22–32)
Calcium: 12.7 mg/dL — ABNORMAL HIGH (ref 8.9–10.3)
Creatinine, Ser: 1.43 mg/dL — ABNORMAL HIGH (ref 0.44–1.00)
GFR calc Af Amer: 38 mL/min — ABNORMAL LOW (ref >60)
GFR, EST NON AFRICAN AMERICAN: 33 mL/min — AB (ref >60)
Glucose, Bld: 128 mg/dL — ABNORMAL HIGH (ref 65–99)
POTASSIUM: 3.5 mmol/L (ref 3.5–5.1)
Sodium: 147 mmol/L — ABNORMAL HIGH (ref 135–145)

## 2016-10-20 LAB — PROTEIN ELECTROPHORESIS, SERUM
A/G RATIO SPE: 1.2 (ref 0.7–1.7)
ALBUMIN ELP: 3.1 g/dL (ref 2.9–4.4)
ALPHA-1-GLOBULIN: 0.4 g/dL (ref 0.0–0.4)
ALPHA-2-GLOBULIN: 0.8 g/dL (ref 0.4–1.0)
BETA GLOBULIN: 1.1 g/dL (ref 0.7–1.3)
Gamma Globulin: 0.2 g/dL — ABNORMAL LOW (ref 0.4–1.8)
Globulin, Total: 2.6 g/dL (ref 2.2–3.9)
M-Spike, %: 0.4 g/dL — ABNORMAL HIGH
Total Protein ELP: 5.7 g/dL — ABNORMAL LOW (ref 6.0–8.5)

## 2016-10-20 LAB — CALCIUM, IONIZED: Calcium, Ionized, Serum: 8.6 mg/dL — ABNORMAL HIGH (ref 4.5–5.6)

## 2016-10-20 LAB — UREA NITROGEN, URINE: Urea Nitrogen, Ur: 225 mg/dL

## 2016-10-20 LAB — URINE CULTURE

## 2016-10-20 LAB — VITAMIN D 25 HYDROXY (VIT D DEFICIENCY, FRACTURES): Vit D, 25-Hydroxy: 92.2 ng/mL (ref 30.0–100.0)

## 2016-10-20 LAB — PARATHYROID HORMONE, INTACT (NO CA): PTH: 20 pg/mL (ref 15–65)

## 2016-10-20 NOTE — Progress Notes (Signed)
PROGRESS NOTE  Kristin [name] EXB:[id number] DOB: 06-Feb-1931 DOA: 10/18/2016 PCP: [name], MD  HPI/Recap of past 24 hours:  Remain confused, but slowing improving, calm and cooperative, daughters at bedside  Assessment/Plan: Principal Problem:   Acute encephalopathy Active Problems:   Acute renal failure superimposed on stage 3 chronic kidney disease ([facility])   HLD (hyperlipidemia)   HTN (hypertension)   Vertigo   Anemia   Back pain   Hypercalcemia  Hypercalcemia: unclear etiology PTH wnl,/ PTH related peptide pending, vitamin D level high normal, urine calcium, upep pending, spep with small mspike, patient does not want to have bone survey done due to not able to sit for longer period of time She received hydration initially, ivf stopped due to concerning for developing pulmonary edema ,  Calcium improving on calcitonin   consider bisphosphonate once cr normalized  Metabolic encephalopathy:  Likely from hypercalcemia, ua no infection Recent ct head a two weeks ago , no acute findings, consider mri brain If mental status dose not improve with correcting calcium.   AKI on CKDIII, likely from dehydration, cr improved with hydration, ivf held due to pleural effusion,  spep with small m spike, upep pending   Anemia, macrocytic, recent b12/folate/iron study wnl , tsh pending,  Spep/upep as above  HLD:  -hold Zetia due to AMS  HTN: -oral Bp meds held initially due to confusion, resumed with improved mental status -IV hydralazine when necessary  Vertigo: -Hold Meclizen  T2 and T4 vertebral compression fracture:  I have discussed with neurosurgery Dr Saintclair Halsted who recommended conservative management with back braces if needed.  Healing nondisplaced fracture of the left clavicular head with severe surrounding callus formation and soft tissue swelling likely accounting for the palpable abnormality. Supportive measures  FTT, family states can provide 24/7  care  Code Status: full  Family Communication: patient and daughtera at bedside  Disposition Plan: home with home health in a few days   Consultants:  Oncology  neurosurgery  Procedures:  none  Antibiotics:  none   Objective: BP 140/62 (BP Location: Right Arm)   Pulse (!) 102   Temp 99 F (37.2 C) (Oral)   Resp 18   Ht 5' (1.524 m)   Wt 63.1 kg (139 lb 1.8 oz)   SpO2 97%   BMI 27.17 kg/m   Intake/Output Summary (Last 24 hours) at 10/20/16 1636 Last data filed at 10/20/16 1045  Gross per 24 hour  Intake              625 ml  Output              550 ml  Net               75 ml   Filed Weights   10/18/16 2251  Weight: 63.1 kg (139 lb 1.8 oz)    Exam:   General:  Frail, confused, but NAD  Cardiovascular: RRR  Respiratory: CTABL  Abdomen: Soft/ND/NT, positive BS  Musculoskeletal: No Edema  Neuro: confused, following commands  Data Reviewed: Basic Metabolic Panel:  Recent Labs Lab 10/18/16 1942 10/19/16 0045 10/19/16 0637 10/20/16 0903  NA 142 141 146* 147*  K 3.7 3.3* 3.4* 3.5  CL 106 106 110 112*  CO2 29 27 26 25   GLUCOSE 113* 94 114* 128*  BUN 28* 27* 26* 26*  CREATININE 1.89* 1.65* 1.54* 1.43*  CALCIUM >15.0* 14.4* 13.7* 12.7*   Liver Function Tests:  Recent Labs Lab 10/18/16 1942  AST 24  ALT 10*  ALKPHOS 150*  BILITOT 1.1  PROT 6.3*  ALBUMIN 3.7   No results for input(s): LIPASE, AMYLASE in the last 168 hours. No results for input(s): AMMONIA in the last 168 hours. CBC:  Recent Labs Lab 10/18/16 1942  WBC 8.7  NEUTROABS 4.5  HGB 8.0*  HCT 23.1*  MCV 103.1*  PLT 173   Cardiac Enzymes:   No results for input(s): CKTOTAL, CKMB, CKMBINDEX, TROPONINI in the last 168 hours. BNP (last 3 results) No results for input(s): BNP in the last 8760 hours.  ProBNP (last 3 results) No results for input(s): PROBNP in the last 8760 hours.  CBG:  Recent Labs Lab 10/19/16 0742 10/20/16 0755  GLUCAP 108* 122*     Recent Results (from the past 240 hour(s))  Urine culture     Status: Abnormal   Collection Time: 10/19/16  7:55 AM  Result Value Ref Range Status   Specimen Description URINE, CLEAN CATCH  Final   Special Requests NONE  Final   Culture MULTIPLE SPECIES PRESENT, SUGGEST RECOLLECTION (A)  Final   Report Status 10/20/2016 FINAL  Final     Studies: No results found.  Scheduled Meds: . amLODipine  5 mg Oral Daily  . calcitonin  250 Units Subcutaneous BID  . cycloSPORINE  1 drop Both Eyes Daily  . enoxaparin (LOVENOX) injection  30 mg Subcutaneous QHS  . sodium chloride flush  3 mL Intravenous Q12H    Continuous Infusions:    Time spent: 87mns  Adetokunbo Mccadden MD, PhD  [facility] Pager [phone] If 7PM-7AM, please contact night-coverage at [email or web address], password [email or web address]3/03/2017, 4:36 PM  LOS: 2 days

## 2016-10-20 NOTE — Evaluation (Signed)
Clinical/Bedside Swallow Evaluation Patient Details  Name: Kristin [name] MRN: [id number] Date of Birth: 04-10-1931  Today's Date: 10/20/2016 Time: SLP Start Time (ACUTE ONLY): 0846 SLP Stop Time (ACUTE ONLY): 0915 SLP Time Calculation (min) (ACUTE ONLY): 29 min  Past Medical History:  Past Medical History:  Diagnosis Date  . Allergy   . Arthritis   . Chronic kidney disease (CKD), stage III (moderate)    Archie Endo 09/23/2016  . Depression    Risperdal "twice/day" (09/23/2016)  . Glaucoma   . Hiatal hernia   . Hypertension   . Intractable back pain    /notes 09/23/2016  . Obesity   . Osteoporosis    vertebral fracture in 2012  . Upper GI bleed 04/2004   secondary to Mallory-Weiss tear/notes 12/28/2010  . Vertigo    Past Surgical History:  Past Surgical History:  Procedure Laterality Date  . ABDOMINAL HYSTERECTOMY  1974   partial  . APPENDECTOMY     as a child/notes 12/28/2010  . BACK SURGERY    . BREAST BIOPSY Left 1999   Archie Endo 12/28/2010  . CATARACT EXTRACTION, BILATERAL Bilateral 2000s  . ESOPHAGOGASTRODUODENOSCOPY  04/2004   Archie Endo 12/28/2010  . KYPHOPLASTY  05/2011   T10/notes 05/25/2011  . LAPAROSCOPIC CHOLECYSTECTOMY  10/2007   Archie Endo 12/14/2010   HPI:  83 yo female adm to [facility] with fall diagnosed with acute encephalopathy.  Pt with h/o C3-C7 DDD, reported pain with swallowing yesterday and noted to have bony prominence in left clavicular area.  CT showed subacute left medial clavicular fx with hematoma.  CT chest showed pleural effusion.  Pt reportedly having problems swallowing and expectorating mashed potatoes and report odynophagia with gingerale.  Diet is soft with thin liquids.     Assessment / Plan / Recommendation Clinical Impression  Pt presents with minimal oral dysphagia resulting in posterior lingual residuals without pt awareness.  Use of liquid consumption facilitated clearance and SLP used teach back to reinforce strategy to pt.  No indication of airway  compromise nor expectoration of po.  Pt does report "mouth pain" and verbally questioning allergies but does not expand on information with cues.  No indication of odynophagia with po observed.  Suspect possible cognitive issues may be contributing to oral difficulties. Given report of repeated swallow issues, SLP will follow up for tolerance and family education.  Recommend continue diet as tolerated.   SLP Visit Diagnosis: Dysphagia, oral phase (R13.11)    Aspiration Risk  Mild aspiration risk    Diet Recommendation Dysphagia 3 (Mech soft);Thin liquid   Liquid Administration via: Cup;Straw Medication Administration: Whole meds with liquid Supervision: Patient able to self feed Compensations: Minimize environmental distractions;Slow rate;Small sips/bites Postural Changes: Seated upright at 90 degrees;Remain upright for at least 30 minutes after po intake    Other  Recommendations Oral Care Recommendations: Other (Comment) (check for oral residuals)   Follow up Recommendations        Frequency and Duration min 1 x/week          Prognosis Prognosis for Safe Diet Advancement: Fair Barriers to Reach Goals: Motivation;Other (Comment) (? odynophagia)      Swallow Study   General Date of Onset: 10/20/16 HPI: 83 yo female adm to [facility] with fall diagnosed with acute encephalopathy.  Pt with h/o C3-C7 DDD, reported pain with swallowing yesterday and noted to have bony prominence in left clavicular area.  CT showed subacute left medial clavicular fx with hematoma.  CT chest showed pleural effusion.  Pt reportedly  having problems swallowing and expectorating mashed potatoes and report odynophagia with gingerale.  Diet is soft with thin liquids.   Type of Study: Bedside Swallow Evaluation Diet Prior to this Study: Dysphagia 3 (soft);Thin liquids Temperature Spikes Noted: No Respiratory Status: Room air (oxygen was located on the floor in room, RN advised) History of Recent Intubation:  No Behavior/Cognition: Alert;Cooperative (pt talking re: apartment and funeral she needs to attend) Oral Cavity Assessment: Dry Oral Care Completed by SLP: No Oral Cavity - Dentition: Dentures, top;Dentures, bottom Vision: Functional for self-feeding Self-Feeding Abilities: Able to feed self Patient Positioning: Upright in bed Baseline Vocal Quality: Low vocal intensity (pt reports voice is normal strength) Volitional Cough: Weak Volitional Swallow: Able to elicit (with delay)    Oral/Motor/Sensory Function Overall Oral Motor/Sensory Function: Generalized oral weakness   Ice Chips Ice chips: Not tested   Thin Liquid Thin Liquid: Within functional limits Presentation: Straw;Self Fed    Nectar Thick Nectar Thick Liquid: Not tested   Honey Thick Honey Thick Liquid: Not tested   Puree Puree: Not tested   Solid   GO   Solid: Impaired Presentation: Spoon;Self Fed Oral Phase Impairments: Reduced lingual movement/coordination;Impaired mastication Oral Phase Functional Implications: Other (comment);Oral residue (oral residuals posterior on left = without awareness, liquids faciliate clearance)        Luanna Salk, [facility] SLP [phone]

## 2016-10-20 NOTE — Progress Notes (Signed)
Chaplain visited with patient and Lucita Ferrara, the daughter of the patient. The patient would like to move forward with completing the Advanced Directive.  Chaplain explained paperwork to the patient and the daughter.  The daughter shared that they already had the paperwork and thoroughly discussed it but didn't have a copy to complete.  Chaplain gave daughter and patient a fresh copy which was completed with Kandra Nicolas, the [facility] and two witnesses.  A copy of the Advanced Directive was placed on the patient's chart at the nurses station and a copy was taken to Medical Records by the [place].  The original copy was then given back to Lucita Ferrara the daughter of the patient who now has POA.     10/20/16 1023  Clinical Encounter Type  Visited With Patient and family together  Visit Type Follow-up (Complete Advanced Directive with AC, Kandra Nicolas)  Advance Directives (For Healthcare)  Does Patient Have a Medical Advance Directive? Yes   Dorna Bloom Resident

## 2016-10-20 NOTE — Plan of Care (Signed)
Problem: Pain Managment: Goal: General experience of comfort will improve Outcome: Progressing No signs of of pain noted.  Pt denies pain.

## 2016-10-20 NOTE — Progress Notes (Signed)
PT Cancellation Note  Patient Details Name: Kristin [name] MRN: [id number] DOB: [birth year]   Cancelled Treatment:    Reason Eval/Treat Not Completed: Attempted PT eval-pt currently unavailable-working on advanced directives. Will check back as schedule allows. Thanks.    Weston Anna, MPT Pager: [phone]

## 2016-10-20 NOTE — Progress Notes (Signed)
PT Cancellation Note  Patient Details Name: Kristin [name] MRN: [id number] DOB: 1930-11-10   Cancelled Treatment:    Reason Eval/Treat Not Completed: Attempted PT eval. Pt currently resting. Daughter politely requested PT check back later today. Will check back later. Thanks.    Weston Anna, MPT Pager: [phone]

## 2016-10-20 NOTE — Progress Notes (Signed)
PT Cancellation Note  Patient Details Name: Kristin [name] MRN: [id number] DOB: 17-Dec-1930   Cancelled Treatment:    Reason Eval/Treat Not Completed: Attempted PT eval a 2nd time on today-family requested PT check back on tomorrow.    Weston Anna, MPT Pager: [phone]

## 2016-10-20 NOTE — Progress Notes (Signed)
Kristin [name]   DOB:05-Jun-1931   PQ#:[id number]   ENM#:[id number]  Hematology oncology consult  Referring physician: [facility] hospitalist Dr. Erlinda Hong   History of present illness: 83 year old African-American female, with past medical history of hypertension, chronic back pain, [name] stage III, who presents with altered mental status. She was admitted for hypercalcemia. I was called for consult to rule out multiple myeloma.   The patient is joined by family today. They help to report the patient's history. The patient has been lethargic and confused for the past week. She has issues with mobility at this time. She is constipated.   The family reports the patient fell last October, and her PCP suspected she broke her back. The patient received a cortisone injection in January, which was followed by onset of pain and decreased mobility. The patient was subsequently admitted to [name], where the family reports they discovered she was being given someone else's medicine. This led to the patient's confused mental status and difficulty with ambulation. The patient fell again while in [name], and she was brought back to home by her family. She has 24/7 care at home. In the ED, she was found to have calcium more than 15, she was admitted for further management. She received calcitonin, her calcium came down to 12.7 today. Her SPEP was positive for M protein-0.4 g/dl, possible represent a monoclonal gammopathy. Her 24-hour urine light chains are pending. Her PTH was normal at 20, PTH-rp is pending.   Objective:  Vitals:   10/20/16 0449 10/20/16 1500  BP: (!) 139/54 140/62  Pulse: (!) 103 (!) 102  Resp: 18 18  Temp: 97.8 F (36.6 C) 99 F (37.2 C)    Body mass index is 27.17 kg/m.  Intake/Output Summary (Last 24 hours) at 10/20/16 1840 Last data filed at 10/20/16 1045  Gross per 24 hour  Intake              240 ml  Output              350 ml  Net             -110 ml      Sclerae unicteric  Oropharynx clear  No peripheral adenopathy  Lungs clear -- no rales or rhonchi  Heart regular rate and rhythm  Abdomen benign  MSK (+) tenderness in the left shoulder, no focal spinal tenderness, no peripheral edema  Neuro nonfocal    CBG (last 3)   Recent Labs  10/19/16 0742 10/20/16 0755  GLUCAP 108* 122*     Labs:  Lab Results  Component Value Date   WBC 8.7 10/18/2016   HGB 8.0 (L) 10/18/2016   HCT 23.1 (L) 10/18/2016   MCV 103.1 (H) 10/18/2016   PLT 173 10/18/2016   NEUTROABS 4.5 10/18/2016   CMP Latest Ref Rng & Units 10/20/2016 10/19/2016 10/19/2016  Glucose 65 - 99 mg/dL 128(H) 114(H) 94  BUN 6 - 20 mg/dL 26(H) 26(H) 27(H)  Creatinine 0.44 - 1.00 mg/dL 1.43(H) 1.54(H) 1.65(H)  Sodium 135 - 145 mmol/L 147(H) 146(H) 141  Potassium 3.5 - 5.1 mmol/L 3.5 3.4(L) 3.3(L)  Chloride 101 - 111 mmol/L 112(H) 110 106  CO2 22 - 32 mmol/L 25 26 27   Calcium 8.9 - 10.3 mg/dL 12.7(H) 13.7(HH) 14.4(HH)  Total Protein 6.5 - 8.1 g/dL - - -  Total Bilirubin 0.3 - 1.2 mg/dL - - -  Alkaline Phos 38 - 126 U/L - - -  AST 15 - 41  U/L - - -  ALT 14 - 54 U/L - - -    Urine Studies No results for input(s): UHGB, CRYS in the last 72 hours.  Invalid input(s): UACOL, UAPR, USPG, UPH, UTP, UGL, UKET, UBIL, UNIT, UROB, [place], UEPI, UWBC, [place], [place], [place], [place], [place]  Basic Metabolic Panel:  Recent Labs Lab 10/18/16 1942 10/19/16 0045 10/19/16 0637 10/20/16 0903  NA 142 141 146* 147*  K 3.7 3.3* 3.4* 3.5  CL 106 106 110 112*  CO2 29 27 26 25   GLUCOSE 113* 94 114* 128*  BUN 28* 27* 26* 26*  CREATININE 1.89* 1.65* 1.54* 1.43*  CALCIUM >15.0* 14.4* 13.7* 12.7*   GFR Estimated Creatinine Clearance: 24.3 mL/min (by C-G formula based on SCr of 1.43 mg/dL (H)). Liver Function Tests:  Recent Labs Lab 10/18/16 1942  AST 24  ALT 10*  ALKPHOS 150*  BILITOT 1.1  PROT 6.3*  ALBUMIN 3.7   No results for input(s): LIPASE, AMYLASE in the last 168 hours. No  results for input(s): AMMONIA in the last 168 hours. Coagulation profile No results for input(s): INR, PROTIME in the last 168 hours.  CBC:  Recent Labs Lab 10/18/16 1942  WBC 8.7  NEUTROABS 4.5  HGB 8.0*  HCT 23.1*  MCV 103.1*  PLT 173   Cardiac Enzymes: No results for input(s): CKTOTAL, CKMB, CKMBINDEX, TROPONINI in the last 168 hours. BNP: Invalid input(s): POCBNP CBG:  Recent Labs Lab 10/19/16 0742 10/20/16 0755  GLUCAP 108* 122*   D-Dimer No results for input(s): DDIMER in the last 72 hours. Hgb A1c No results for input(s): HGBA1C in the last 72 hours. Lipid Profile No results for input(s): CHOL, HDL, LDLCALC, TRIG, CHOLHDL, LDLDIRECT in the last 72 hours. Thyroid function studies No results for input(s): TSH, T4TOTAL, T3FREE, THYROIDAB in the last 72 hours.  Invalid input(s): FREET3 Anemia work up No results for input(s): VITAMINB12, FOLATE, FERRITIN, TIBC, IRON, RETICCTPCT in the last 72 hours. Microbiology Recent Results (from the past 240 hour(s))  Urine culture     Status: Abnormal   Collection Time: 10/19/16  7:55 AM  Result Value Ref Range Status   Specimen Description URINE, CLEAN CATCH  Final   Special Requests NONE  Final   Culture MULTIPLE SPECIES PRESENT, SUGGEST RECOLLECTION (A)  Final   Report Status 10/20/2016 FINAL  Final      Studies:  Dg Chest 2 View  Result Date: 10/18/2016 CLINICAL DATA:  Initial evaluation for acute altered mental status, recent fall. EXAM: CHEST  2 VIEW COMPARISON:  Prior radiograph from 10/02/2016. FINDINGS: Moderate cardiomegaly, stable. Mediastinal silhouette within normal limits. Aortic atherosclerosis with tortuosity the intrathoracic aorta. Lungs are hypoinflated. Diffusely increased vascular congestion with interstitial prominence, suggesting pulmonary interstitial congestion/ edema. Increased opacity at the right lung base may reflect atelectasis/ edema or infiltrate. No other focal infiltrates. No  pneumothorax. No acute osseous abnormality. Patient status post vertebral augmentation at T9. Degenerative changes noted about the shoulders bilaterally. IMPRESSION: 1. Stable cardiomegaly with diffuse increased vascular congestion and interstitial prominence, suggesting pulmonary interstitial edema. 2. More confluent opacity at the right lung base. While this finding may reflect atelectasis and/or edema/congestion, possible infiltrate could also be considered. 3. Aortic atherosclerosis. Electronically Signed   By: Jeannine Boga M.D.   On: 10/18/2016 22:02   Ct Soft Tissue Neck Wo Contrast  Result Date: 10/19/2016 CLINICAL DATA:  Fall 10/02/2016. Left shoulder pain and swelling. Dysphagia. EXAM: CT NECK WITHOUT CONTRAST TECHNIQUE: Multidetector CT imaging of the neck  was performed following the standard protocol without intravenous contrast. COMPARISON:  CT cervical spine 10/02/2016 FINDINGS: Pharynx and larynx: Negative Salivary glands: Negative Thyroid: Negative Lymph nodes: No pathologic lymph nodes in the neck. Vascular: Limited evaluation due to lack of intravenous contrast. Calcification in the left internal carotid artery. Limited intracranial: Negative Visualized orbits: Negative Mastoids and visualized paranasal sinuses: Mild mucosal edema left maxillary sinus. Otherwise negative. Skeleton: Fracture of the left medial clavicle with mild displacement. Surrounding soft tissue calcification most compatible with calcifying hematoma. No bony union. There is mild periosteal reaction. Reconstructed sagittal images demonstrate moderate fractures of T2 and T4. These show sclerosis and could be healing fractures. These were not scanned on the prior cervical spine CT. There is moderate disc degeneration and spurring throughout the cervical spine. Upper chest: Chest CT from today reported separately Other: None IMPRESSION: Subacute fracture left medial clavicle. Calcifying hematoma around the fracture site.  This accounts for the palpable abnormality. Moderately severe compression fractures T2 and T4 which may be healing fractures. No mass or soft tissue edema in the neck. Electronically Signed   By: Franchot Gallo M.D.   On: 10/19/2016 15:57   Ct Chest Wo Contrast  Result Date: 10/19/2016 CLINICAL DATA:  Upon assessing pt, a hard, bony prominence noted at along the left throat/clavicle area. EXAM: CT CHEST WITHOUT CONTRAST TECHNIQUE: Multidetector CT imaging of the chest was performed following the standard protocol without IV contrast. COMPARISON:  None. FINDINGS: Bones/Joint/Cartilage Healing nondisplaced fracture of the left clavicular head with severe surrounding callus formation and soft tissue swelling likely accounting for the palpable abnormality. No sternoclavicular dislocation. Mild osteoarthritis of the right sternoclavicular joint. There is suggestion of a lytic lesion at the fracture site which may be secondary to metastatic disease or multiple myeloma versus osteolysis secondary to the fracture. Mild arthropathy of bilateral acromioclavicular joints. No aggressive lytic or sclerotic osseous lesion. Degenerative disc disease with disc height loss C3-4, C5-6 and C6-7. Mild broad-based disc bulge at C3-4 with bilateral mild facet arthropathy. Bilateral uncovertebral degenerative changes at C4-5 and C5-6 with bilateral facet arthropathy and foraminal stenosis. Broad-based disc osteophyte complex at C6-7 with bilateral facet arthropathy. Bilateral facet arthropathy at C7-T1. T2 vertebral body compression fracture likely acute. Lucent lesion in the left lateral aspect of the T2 vertebral body which may reflect metastatic disease versus multiple myeloma. Chronic T4 vertebral body compression fracture. Ligaments Ligaments are suboptimally evaluated by CT. Muscles and Tendons Muscles are normal.  No muscle atrophy. Soft tissue No fluid collection or hematoma. No soft tissue mass. Small right pleural effusion.  IMPRESSION: 1. Healing nondisplaced fracture of the left clavicular head with severe surrounding callus formation and soft tissue swelling likely accounting for the palpable abnormality. There is suggestion of a lytic lesion at the fracture site which may be secondary to metastatic disease or multiple myeloma versus osteolysis secondary to the fracture. 2. Acute T2 vertebral body compression fracture with mild height loss. No retropulsion into the spinal canal. Lucent lesion in the left lateral aspect of the T2 vertebral body which may reflect metastatic disease versus multiple myeloma. Electronically Signed   By: Kathreen Devoid   On: 10/19/2016 15:56   Dg Shoulder Left  Result Date: 10/18/2016 CLINICAL DATA:  Initial evaluation for acute pain, status post recent fall. EXAM: LEFT SHOULDER - 2+ VIEW COMPARISON:  None available. FINDINGS: No acute fracture or dislocation. Humeral head in normal alignment with the glenoid. Degenerative osteophytic spurring present at the inferior aspect of the  humeral head. AC joint is approximated. No periarticular calcification. Congestive changes noted within the partially visualized left lung. IMPRESSION: 1. No acute fracture or dislocation about the left shoulder. 2. Degenerative osteoarthrosis about the left glenohumeral joint. Electronically Signed   By: Jeannine Boga M.D.   On: 10/18/2016 22:04    Assessment: 83 y.o. woman with HTN and CKD, presented with hypercalcemia, worsening anemia since 06/2016, s/p multiple fall, multiple hospitalization recently for back pain , CT scan showed T2 vertebral body compression fracture with mild height loss.  1. Hypercalcemia 2. Acute encephalopathy, likely secondary to #1 3. Worsening anemia  4. Acute on chronic CKD  5. T2 compression fracture and severe back pain  6. 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 -I recommend a bone marrow biopsy by interventional radiology, I'll keep patient nothing by mouth after midnight, to see if they can do the biopsy tomorrow. -I will order serum light chain levels, urine light chain level is pending, will add UPEP too  -if BM biopsy negative, I will obtain a CT abdomen wo contrast to rule out other malignancy  -I will follow up   This document serves as a record of services personally performed by Truitt Merle, MD. It was created on her behalf by Maryla Morrow, a trained medical scribe. The creation of this record is based on the scribe's personal observations and the provider's statements to them. This document has been checked and approved by the attending provider.  Truitt Merle, MD 10/20/2016  6:40 PM

## 2016-10-21 ENCOUNTER — Inpatient Hospital Stay (HOSPITAL_COMMUNITY): Payer: Medicare Other

## 2016-10-21 LAB — BASIC METABOLIC PANEL
Anion gap: 9 (ref 5–15)
BUN: 25 mg/dL — ABNORMAL HIGH (ref 6–20)
CHLORIDE: 110 mmol/L (ref 101–111)
CO2: 25 mmol/L (ref 22–32)
Calcium: 11.8 mg/dL — ABNORMAL HIGH (ref 8.9–10.3)
Creatinine, Ser: 1.37 mg/dL — ABNORMAL HIGH (ref 0.44–1.00)
GFR, EST AFRICAN AMERICAN: 40 mL/min — AB (ref >60)
GFR, EST NON AFRICAN AMERICAN: 34 mL/min — AB (ref >60)
Glucose, Bld: 210 mg/dL — ABNORMAL HIGH (ref 65–99)
POTASSIUM: 2.9 mmol/L — AB (ref 3.5–5.1)
SODIUM: 144 mmol/L (ref 135–145)

## 2016-10-21 LAB — LACTATE DEHYDROGENASE: LDH: 122 U/L (ref 98–192)

## 2016-10-21 LAB — GLUCOSE, CAPILLARY: GLUCOSE-CAPILLARY: 125 mg/dL — AB (ref 65–99)

## 2016-10-21 MED ORDER — ENSURE ENLIVE PO LIQD
237.0000 mL | Freq: Two times a day (BID) | ORAL | Status: DC
  Administered 2016-10-21 – 2016-10-24 (×4): 237 mL via ORAL

## 2016-10-21 MED ORDER — POTASSIUM CHLORIDE 20 MEQ/15ML (10%) PO SOLN
40.0000 meq | ORAL | Status: AC
  Administered 2016-10-21 (×2): 40 meq via ORAL
  Filled 2016-10-21 (×2): qty 30

## 2016-10-21 MED ORDER — CALCITONIN (SALMON) 200 UNIT/ML IJ SOLN
250.0000 [IU] | Freq: Once | INTRAMUSCULAR | Status: AC
  Administered 2016-10-21: 250 [IU] via SUBCUTANEOUS
  Filled 2016-10-21: qty 1.25

## 2016-10-21 NOTE — Progress Notes (Signed)
Initial Nutrition Assessment  DOCUMENTATION CODES:   Not applicable  INTERVENTION:  - Will order Ensure Enlive po BID, each supplement provides 350 kcal and 20 grams of protein - Continue to encourage PO intakes of meals and supplements. - RD will continue to monitor for additional nutrition-related needs.  NUTRITION DIAGNOSIS:   Inadequate oral intake related to acute illness, lethargy/confusion as evidenced by meal completion < 50%.   GOAL:   Patient will meet greater than or equal to 90% of their needs  MONITOR:   PO intake, Supplement acceptance, Weight trends, Labs  REASON FOR ASSESSMENT:   Rounds  ASSESSMENT:   83 y.o. female with medical history significant of hypertension, hyperlipidemia, vertigo, depression, GI bleeding, chronic back pain, chronic kidney disease-stage III, who presents with altered mental status.  BMI indicates overweight status, appropriate for age. Per chart review, pt has been eating 10-50% of meals since admission. Reviewed RN notes from 3/7 detailing concerns with swallowing difficulty/unwillingness to swallow. SLP saw pt 3/8 and recommended Dysphagia 3, thin liquids. Pt admitted with acute encephalopathy which is improving.   Unable to talk with pt at this time. Unable to complete physical assessment but will do so at follow-up and document findings at that time. Per chart review, weight mainly since since May 2014. Based on weights in chart, she has lost 16 lbs (10% body weight) in the 16 days PTA. Notes indicate pt was dehydrated on admission. No new weight since admission; will monitor closely if new weights obtained during admission.  Medications reviewed. Labs reviewed; CBG: 125 mg/dL today, K: 2.9 mmol/L, BUN: 25 mg/dL, creatinine: 1.37 mg/dL, Ca: 11.8 mg/dL, GFR: 40 mL/min.    Diet Order:  DIET SOFT Room service appropriate? Yes; Fluid consistency: Thin  Skin:  Reviewed, no issues  Last BM:  3/8  Height:   Ht Readings from Last 1  Encounters:  10/18/16 5' (1.524 m)    Weight:   Wt Readings from Last 1 Encounters:  10/18/16 139 lb 1.8 oz (63.1 kg)    Ideal Body Weight:  45.45 kg  BMI:  Body mass index is 27.17 kg/m.  Estimated Nutritional Needs:   Kcal:  1260-1450 (20-23 kcal/kg)  Protein:  50-60 grams  Fluid:  >/= 1.5 L/day  EDUCATION NEEDS:   No education needs identified at this time    Jarome Matin, MS, RD, LDN, CNSC Inpatient Clinical Dietitian Pager # [phone] After hours/weekend pager # [phone]

## 2016-10-21 NOTE — Progress Notes (Signed)
Pt refused lab  This am, have agreed to have labs drawn. SRP, RN

## 2016-10-21 NOTE — Progress Notes (Signed)
Chaplain stopped in to see Lucita Ferrara, daughter and POA for this patient.  Dr. Florencia Reasons wanted to know whether they have talked about DNR and other life preserving mechanism's since they completed the Advanced Directive.  Chaplain shared this information with Lucita Ferrara so that she could have this discussion with her mother and other family members.    Very pleasant visit with Carlean also.    10/21/16 1000  Clinical Encounter Type  Visited With Patient;Family  Visit Type Follow-up;Spiritual support   Theodoro Doing Chaplain Resident

## 2016-10-21 NOTE — Progress Notes (Signed)
PT Cancellation Note  Patient Details Name: Kristin [name] MRN: [id number] DOB: 1930/10/05   Cancelled Treatment:    Reason Eval/Treat Not Completed: Attempted PT eval this am. Family requested PT check back another time. Pt is set to have a procedure sometime this a.m. per daughter. Will check back another time/day at family's request.    Weston Anna, MPT Pager: [phone]

## 2016-10-21 NOTE — Care Management Important Message (Signed)
Important Message  Patient Details  Name: Kristin [name] MRN: [id number] Date of Birth: Feb 08, 1931   Medicare Important Message Given:  Yes    McGibboneyOletta Darter, RN 10/21/2016, 3:01 PM

## 2016-10-21 NOTE — Progress Notes (Signed)
Pt is home with daughter and 24/7 caregivers. No needs at present time.

## 2016-10-21 NOTE — Progress Notes (Signed)
Oncology brief note  I spoke with IR this morning, they are not available for BM biopsy this morning, OK for pt to eat, cancel NPO.   IR is able to do BM biopsy on Monday. Please schedule and consult IR. I discussed with possibility of biopsy as outpt, pt's family strongly prefers to get it done before discharge.  Truitt Merle  10/21/2016

## 2016-10-21 NOTE — Progress Notes (Signed)
Pt nauseated abd vomited, family states she has spit up.vomited for each meal, she has only had lunch and dinner , med given for nausea. SRP, RN

## 2016-10-21 NOTE — Progress Notes (Addendum)
PROGRESS NOTE  Kristin [name] VPX:[id number] DOB: [birth year] DOA: 10/18/2016 PCP: [name], MD  HPI/Recap of past 24 hours:  Still very weak and lethargic , but less confused, she knows it is 10/2016, she states she is at [place], daughter at bedside  Assessment/Plan: Principal Problem:   Acute encephalopathy Active Problems:   Acute renal failure superimposed on stage 3 chronic kidney disease (HCC)   HLD (hyperlipidemia)   HTN (hypertension)   Vertigo   Anemia   Back pain   Hypercalcemia  Hypercalcemia: unclear etiology PTH wnl, PTH related peptide pending, vitamin D level high normal, urine calcium, upep pending, spep with small mspike, patient does not want to have bone survey done due to not able to sit for longer period of time, oncology consulted, patient had bone marrow biopsy on 3/9 She received hydration initially, ivf stopped due to concerning for developing pulmonary edema ,  Calcium improving on calcitonin   consider bisphosphonate once cr normalized  Metabolic encephalopathy:  Likely from hypercalcemia, ua no infection Recent ct head a two weeks ago , no acute findings, consider mri brain If mental status dose not improve with correcting calcium. Mental status improving  Hypokalemia: replace k  AKI on CKDIII, likely from dehydration, cr improved with hydration, ivf held due to pleural effusion,  spep with small m spike, upep pending   Anemia, macrocytic, recent b12/folate/iron study wnl , tsh pending,  Spep/upep as above  HLD:  -hold Zetia due to AMS  HTN: -oral Bp meds held initially due to confusion, resumed with improved mental status -IV hydralazine when necessary  Vertigo: -Hold Meclizen  T2 and T4 vertebral compression fracture:  I have discussed with neurosurgery Dr Saintclair Halsted who recommended conservative management with back braces if needed. Currently patient denies pain  Healing nondisplaced fracture of the left clavicular head  with severe surrounding callus formation and soft tissue swelling likely accounting for the palpable abnormality. Supportive measures  FTT, family states can provide 24/7 care  Code Status: full  Family Communication: patient and daughtera at bedside  Disposition Plan: home with home health in a few days, patient has 24/7 care at home, family donot want snf.   Consultants:  Oncology  Neurosurgery phone conversation with Dr Saintclair Halsted  Procedures:  Bone marrow biopsy on 3/9  Antibiotics:  none   Objective: BP (!) 145/72 (BP Location: Right Arm)   Pulse (!) 104   Temp 99.3 F (37.4 C) (Oral)   Resp 18   Ht 5' (1.524 m)   Wt 63.1 kg (139 lb 1.8 oz)   SpO2 97%   BMI 27.17 kg/m   Intake/Output Summary (Last 24 hours) at 10/21/16 0954 Last data filed at 10/21/16 0600  Gross per 24 hour  Intake              360 ml  Output              550 ml  Net             -190 ml   Filed Weights   10/18/16 2251  Weight: 63.1 kg (139 lb 1.8 oz)    Exam:   General:  Frail, lethargic, less confused, but NAD  Cardiovascular: RRR  Respiratory: CTABL  Abdomen: Soft/ND/NT, positive BS  Musculoskeletal: No Edema  Neuro:less confused, following commands  Data Reviewed: Basic Metabolic Panel:  Recent Labs Lab 10/18/16 1942 10/19/16 0045 10/19/16 0637 10/20/16 0903  NA 142 141 146* 147*  K 3.7 3.3*  3.4* 3.5  CL 106 106 110 112*  CO2 29 27 26 25   GLUCOSE 113* 94 114* 128*  BUN 28* 27* 26* 26*  CREATININE 1.89* 1.65* 1.54* 1.43*  CALCIUM >15.0* 14.4* 13.7* 12.7*   Liver Function Tests:  Recent Labs Lab 10/18/16 1942  AST 24  ALT 10*  ALKPHOS 150*  BILITOT 1.1  PROT 6.3*  ALBUMIN 3.7   No results for input(s): LIPASE, AMYLASE in the last 168 hours. No results for input(s): AMMONIA in the last 168 hours. CBC:  Recent Labs Lab 10/18/16 1942  WBC 8.7  NEUTROABS 4.5  HGB 8.0*  HCT 23.1*  MCV 103.1*  PLT 173   Cardiac Enzymes:   No results for  input(s): CKTOTAL, CKMB, CKMBINDEX, TROPONINI in the last 168 hours. BNP (last 3 results) No results for input(s): BNP in the last 8760 hours.  ProBNP (last 3 results) No results for input(s): PROBNP in the last 8760 hours.  CBG:  Recent Labs Lab 10/19/16 0742 10/20/16 0755 10/21/16 0727  GLUCAP 108* 122* 125*    Recent Results (from the past 240 hour(s))  Urine culture     Status: Abnormal   Collection Time: 10/19/16  7:55 AM  Result Value Ref Range Status   Specimen Description URINE, CLEAN CATCH  Final   Special Requests NONE  Final   Culture MULTIPLE SPECIES PRESENT, SUGGEST RECOLLECTION (A)  Final   Report Status 10/20/2016 FINAL  Final     Studies: No results found.  Scheduled Meds: . amLODipine  5 mg Oral Daily  . cycloSPORINE  1 drop Both Eyes Daily  . enoxaparin (LOVENOX) injection  30 mg Subcutaneous QHS  . sodium chloride flush  3 mL Intravenous Q12H    Continuous Infusions:    Time spent: 39mns  Everlina Gotts MD, PhD  [facility] Pager [phone] If 7PM-7AM, please contact night-coverage at [email or web address], password TL[facility]3/04/2017, 9:54 AM  LOS: 3 days

## 2016-10-21 NOTE — Progress Notes (Signed)
Chaplain stopped in to see the friends she had made with the [place] family.  Patient is making progress recovering metal status.  Enjoyed talking with the patient and the family about the [facility] and the level of involvement they have there.      10/21/16 1527  Clinical Encounter Type  Visited With Patient;Patient and family together  Visit Type Follow-up;Spiritual support;Social support   [employer] Chaplain Resident

## 2016-10-22 ENCOUNTER — Inpatient Hospital Stay (HOSPITAL_COMMUNITY): Payer: Medicare Other

## 2016-10-22 LAB — TSH: TSH: 1.773 u[IU]/mL (ref 0.350–4.500)

## 2016-10-22 LAB — BASIC METABOLIC PANEL
Anion gap: 6 (ref 5–15)
BUN: 24 mg/dL — ABNORMAL HIGH (ref 6–20)
CALCIUM: 11.5 mg/dL — AB (ref 8.9–10.3)
CHLORIDE: 111 mmol/L (ref 101–111)
CO2: 25 mmol/L (ref 22–32)
CREATININE: 1.26 mg/dL — AB (ref 0.44–1.00)
GFR calc non Af Amer: 38 mL/min — ABNORMAL LOW (ref >60)
GFR, EST AFRICAN AMERICAN: 44 mL/min — AB (ref >60)
Glucose, Bld: 116 mg/dL — ABNORMAL HIGH (ref 65–99)
Potassium: 4 mmol/L (ref 3.5–5.1)
SODIUM: 142 mmol/L (ref 135–145)

## 2016-10-22 LAB — MAGNESIUM: MAGNESIUM: 1.2 mg/dL — AB (ref 1.7–2.4)

## 2016-10-22 LAB — GLUCOSE, CAPILLARY: GLUCOSE-CAPILLARY: 117 mg/dL — AB (ref 65–99)

## 2016-10-22 MED ORDER — MAGNESIUM SULFATE 2 GM/50ML IV SOLN
2.0000 g | Freq: Once | INTRAVENOUS | Status: AC
  Administered 2016-10-22: 2 g via INTRAVENOUS
  Filled 2016-10-22: qty 50

## 2016-10-22 NOTE — Progress Notes (Signed)
PROGRESS NOTE  Kristin [name] QGB:[id number] DOB: Jun 21, 1931 DOA: 10/18/2016 PCP: [name], MD  HPI/Recap of past 24 hours:  Still very weak and lethargic , but less confused, she knows it is 10/2016, she states she is at [employer] [place], daughters at bedside  Assessment/Plan: Principal Problem:   Acute encephalopathy Active Problems:   Acute renal failure superimposed on stage 3 chronic kidney disease (HCC)   HLD (hyperlipidemia)   HTN (hypertension)   Vertigo   Anemia   Back pain   Hypercalcemia  Hypercalcemia: unclear etiology PTH wnl, PTH related peptide pending, vitamin D level high normal, urine calcium 17.8, upep pending, spep with small mspike,  Kappa/lambda light chair pending, skeletal survey result noted, oncology consulted, patient had bone marrow biopsy on 3/9 She received hydration initially, ivf stopped due to concerning for developing pulmonary edema ,  Calcium improving on calcitonin   consider bisphosphonate once cr normalized  Metabolic encephalopathy:  Likely from hypercalcemia, ua no infection Recent ct head a two weeks ago , no acute findings, consider mri brain If mental status dose not improve with correcting calcium. Mental status improving  N/V, unclear etiology, speech eval, no significant swallow problem, family report patient started to vomit a few minutes after eating, she seems to have trouble with solid, not problem with meds or liquid  will get kub, also get dg esophagus on monday  Hypokalemia/hypomagnesemia: replace k/mag  AKI on CKDIII, likely from dehydration, cr improved with hydration, ivf held due to pleural effusion,  spep with small m spike, upep pending   Anemia, macrocytic, recent b12/folate/iron study wnl , tsh pending,  Spep/upep as above, bone marrow biopsy result pending  HLD:  -hold Zetia due to AMS  HTN: -oral Bp meds held initially due to confusion, resumed with improved mental status -IV hydralazine when  necessary  Vertigo: -Hold Meclizen  T2 and T4 vertebral compression fracture:  I have discussed with neurosurgery Dr Saintclair Halsted who recommended conservative management with back braces if needed. Currently patient denies pain  Healing nondisplaced fracture of the left clavicular head with severe surrounding callus formation and soft tissue swelling likely accounting for the palpable abnormality. Supportive measures  FTT, family states can provide 24/7 care  Code Status: full  Family Communication: patient and daughtera at bedside  Disposition Plan: home with home health in a few days, patient has 24/7 care at home, family donot want snf.   Consultants:  Oncology  Neurosurgery phone conversation with Dr Saintclair Halsted  Procedures:  Bone marrow biopsy on 3/9  Antibiotics:  none   Objective: BP (!) 117/47 (BP Location: Right Arm)   Pulse 96   Temp 99.5 F (37.5 C) (Oral)   Resp 18   Ht 5' (1.524 m)   Wt 63.1 kg (139 lb 1.8 oz)   SpO2 96%   BMI 27.17 kg/m   Intake/Output Summary (Last 24 hours) at 10/22/16 0757 Last data filed at 10/22/16 0600  Gross per 24 hour  Intake              210 ml  Output              900 ml  Net             -690 ml   Filed Weights   10/18/16 2251  Weight: 63.1 kg (139 lb 1.8 oz)    Exam:   General:  Frail, lethargic, less confused, but NAD  Cardiovascular: RRR  Respiratory: CTABL  Abdomen: Soft/ND/NT, positive  BS  Musculoskeletal: No Edema  Neuro:less confused, following commands  Data Reviewed: Basic Metabolic Panel:  Recent Labs Lab 10/19/16 0045 10/19/16 0637 10/20/16 0903 10/21/16 1226 10/22/16 0544  NA 141 146* 147* 144 142  K 3.3* 3.4* 3.5 2.9* 4.0  CL 106 110 112* 110 111  CO2 27 26 25 25 25   GLUCOSE 94 114* 128* 210* 116*  BUN 27* 26* 26* 25* 24*  CREATININE 1.65* 1.54* 1.43* 1.37* 1.26*  CALCIUM 14.4* 13.7* 12.7* 11.8* 11.5*  MG  --   --   --   --  1.2*   Liver Function Tests:  Recent Labs Lab  10/18/16 1942  AST 24  ALT 10*  ALKPHOS 150*  BILITOT 1.1  PROT 6.3*  ALBUMIN 3.7   No results for input(s): LIPASE, AMYLASE in the last 168 hours. No results for input(s): AMMONIA in the last 168 hours. CBC:  Recent Labs Lab 10/18/16 1942  WBC 8.7  NEUTROABS 4.5  HGB 8.0*  HCT 23.1*  MCV 103.1*  PLT 173   Cardiac Enzymes:   No results for input(s): CKTOTAL, CKMB, CKMBINDEX, TROPONINI in the last 168 hours. BNP (last 3 results) No results for input(s): BNP in the last 8760 hours.  ProBNP (last 3 results) No results for input(s): PROBNP in the last 8760 hours.  CBG:  Recent Labs Lab 10/19/16 0742 10/20/16 0755 10/21/16 0727  GLUCAP 108* 122* 125*    Recent Results (from the past 240 hour(s))  Urine culture     Status: Abnormal   Collection Time: 10/19/16  7:55 AM  Result Value Ref Range Status   Specimen Description URINE, CLEAN CATCH  Final   Special Requests NONE  Final   Culture MULTIPLE SPECIES PRESENT, SUGGEST RECOLLECTION (A)  Final   Report Status 10/20/2016 FINAL  Final     Studies: Dg Bone Survey Met  Result Date: 10/21/2016 CLINICAL DATA:  Multiple myeloma. EXAM: METASTATIC BONE SURVEY COMPARISON:  Radiographs of April 25, 2016. MRI of March 18, 2011. FINDINGS: There is interval development of mild compression deformity of L3 vertebral body concerning for fracture of indeterminate age. Status post kyphoplasty of T9 vertebral body. Interval development of moderate compression deformity involving T4 vertebral body concerning for pathologic fracture. Possible 2 small lucent lesions are seen in the skull posteriorly. IMPRESSION: Possible 2 small lucent lesions seen posteriorly in the skull which may represent multiple myeloma. Interval development of moderate compression deformity of T4 vertebral body concerning for possible pathologic fracture given history of multiple myeloma. Also noted is interval development of mild compression deformity of L3  vertebral body concerning for fracture of indeterminate age ; MRI may be performed further evaluation. Electronically Signed   By: Marijo Conception, M.D.   On: 10/21/2016 10:50    Scheduled Meds: . amLODipine  5 mg Oral Daily  . cycloSPORINE  1 drop Both Eyes Daily  . enoxaparin (LOVENOX) injection  30 mg Subcutaneous QHS  . feeding supplement (ENSURE ENLIVE)  237 mL Oral BID BM  . magnesium sulfate 1 - 4 g bolus IVPB  2 g Intravenous Once  . sodium chloride flush  3 mL Intravenous Q12H    Continuous Infusions:    Time spent: 54mns  Walda Hertzog MD, PhD  [facility] Pager [phone] If 7PM-7AM, please contact night-coverage at [email or web address], password TCollege [facility]3/05/2017, 7:57 AM  LOS: 4 days

## 2016-10-22 NOTE — Evaluation (Signed)
Physical Therapy Evaluation Patient Details Name: ZOEANN MOL MRN: [id number] DOB: 07-09-1931 Today's Date: 10/22/2016   History of Present Illness  LYZBETH GENRICH is a 83 y.o. female with medical history significant of chronic back pain, hypertension, hyperlipidemia, vertigo, anemia, chronic kidney disease-stage III, who presents with intractable back pain--> UTI/sepsis; MRI-Lumbar showed multilevel degenerative disc disease, but no spinal cord compression                           Clinical Impression  Pt admitted with above diagnosis. Pt currently with functional limitations due to the deficits listed below (see PT Problem List).  Pt will benefit from skilled PT to increase their independence and safety with mobility to allow discharge to the venue listed below.  Family plans to continue providing 24 hr assist, they do not want pt to go to SNF, therefore recommend HHPT and continued 24 hr care     Follow Up Recommendations Home health PT;Supervision/Assistance - 24 hour    Equipment Recommendations  None recommended by PT    Recommendations for Other Services       Precautions / Restrictions Precautions Precautions: Fall Restrictions Weight Bearing Restrictions: No      Mobility  Bed Mobility Overal bed mobility: Needs Assistance Bed Mobility: Supine to Sit     Supine to sit: Min assist;HOB elevated     General bed mobility comments: pt sits up in bed so unable to roll; no c/o back pain; assist with LEs off EOB and with scooting  Transfers Overall transfer level: Needs assistance Equipment used: Rolling walker (2 wheeled) Transfers: Sit to/from [facility] Sit to Stand: Min assist Stand pivot transfers: Min assist       General transfer comment: Assist to bring hips up and extended time to perform. Used walker to take pivotal steps from bed to recliner; cues for posture and RW position; pt dizzy--BP 109/67 (pt has not been OOB in  4days)  Ambulation/Gait                Stairs            Wheelchair Mobility    Modified Rankin (Stroke Patients Only)       Balance Overall balance assessment: Needs assistance Sitting-balance support: Bilateral upper extremity supported;Feet supported Sitting balance-Leahy Scale: Poor Sitting balance - Comments: reliant on UE support   Standing balance support: Bilateral upper extremity supported Standing balance-Leahy Scale: Poor Standing balance comment: walker and min to min/guard  for static standing                             Pertinent Vitals/Pain Pain Assessment: No/denies pain    Home Living Family/patient expects to be discharged to:: Private residence Living Arrangements: Alone Available Help at Discharge: Available 24 hours/day;Family Type of Home: House (condo) Home Access: Level entry     Home Layout: One level Home Equipment: Grab bars - tub/shower;Bedside commode;Shower seat;Walker - 2 wheels      Prior Function Level of Independence: Independent with assistive device(s)         Comments: Per family she has been using a rolling walker      Hand Dominance        Extremity/Trunk Assessment   Upper Extremity Assessment Upper Extremity Assessment: Generalized weakness    Lower Extremity Assessment Lower Extremity Assessment: Generalized weakness RLE Deficits / Details: strength grossly 3-/5  Communication   Communication: No difficulties  Cognition Arousal/Alertness: Awake/alert Behavior During Therapy: WFL for tasks assessed/performed           Following Commands: Follows one step commands with increased time     Problem Solving: Slow processing;Decreased initiation;Requires verbal cues;Requires tactile cues;Difficulty sequencing      General Comments      Exercises     Assessment/Plan    PT Assessment Patient needs continued PT services  PT Problem List Decreased strength;Decreased  activity tolerance;Decreased balance;Decreased mobility;Decreased cognition;Decreased knowledge of use of DME       PT Treatment Interventions DME instruction;Gait training;Functional mobility training;Therapeutic activities;Therapeutic exercise;Balance training;Patient/family education;Cognitive remediation    PT Goals (Current goals can be found in the Care Plan section)  Acute Rehab PT Goals Patient Stated Goal: home PT Goal Formulation: With patient/family Time For Goal Achievement: 11/05/16 Potential to Achieve Goals: Good    Frequency Min 3X/week   Barriers to discharge        Co-evaluation               End of Session Equipment Utilized During Treatment: Gait belt Activity Tolerance: Patient limited by fatigue Patient left: in chair;with call bell/phone within reach;with family/visitor present;with chair alarm set   PT Visit Diagnosis: Other abnormalities of gait and mobility (R26.89)         Time: 8144-8185 PT Time Calculation (min) (ACUTE ONLY): 20 min   Charges:   PT Evaluation $PT Eval Moderate Complexity: 1 Procedure     PT G Codes:         Bettyjane Shenoy 2016/10/28, 12:58 PM

## 2016-10-23 LAB — CBC WITH DIFFERENTIAL/PLATELET
BASOS ABS: 0.1 10*3/uL (ref 0.0–0.1)
BASOS PCT: 1 %
EOS PCT: 3 %
Eosinophils Absolute: 0.2 10*3/uL (ref 0.0–0.7)
HEMATOCRIT: 18.8 % — AB (ref 36.0–46.0)
Hemoglobin: 6.5 g/dL — CL (ref 12.0–15.0)
Lymphocytes Relative: 44 %
Lymphs Abs: 3.8 10*3/uL (ref 0.7–4.0)
MCH: 35.1 pg — ABNORMAL HIGH (ref 26.0–34.0)
MCHC: 34.6 g/dL (ref 30.0–36.0)
MCV: 101.6 fL — ABNORMAL HIGH (ref 78.0–100.0)
MONO ABS: 1.6 10*3/uL — AB (ref 0.1–1.0)
MONOS PCT: 18 %
Neutro Abs: 3 10*3/uL (ref 1.7–7.7)
Neutrophils Relative %: 34 %
PLATELETS: 195 10*3/uL (ref 150–400)
RBC: 1.85 MIL/uL — ABNORMAL LOW (ref 3.87–5.11)
RDW: 14.3 % (ref 11.5–15.5)
WBC: 8.6 10*3/uL (ref 4.0–10.5)

## 2016-10-23 LAB — BASIC METABOLIC PANEL
Anion gap: 7 (ref 5–15)
BUN: 23 mg/dL — AB (ref 6–20)
CO2: 24 mmol/L (ref 22–32)
CREATININE: 1.15 mg/dL — AB (ref 0.44–1.00)
Calcium: 11.2 mg/dL — ABNORMAL HIGH (ref 8.9–10.3)
Chloride: 106 mmol/L (ref 101–111)
GFR calc Af Amer: 49 mL/min — ABNORMAL LOW (ref >60)
GFR, EST NON AFRICAN AMERICAN: 42 mL/min — AB (ref >60)
GLUCOSE: 102 mg/dL — AB (ref 65–99)
Potassium: 3.8 mmol/L (ref 3.5–5.1)
Sodium: 137 mmol/L (ref 135–145)

## 2016-10-23 LAB — HEPATIC FUNCTION PANEL
ALBUMIN: 3.2 g/dL — AB (ref 3.5–5.0)
ALT: 8 U/L — ABNORMAL LOW (ref 14–54)
AST: 14 U/L — AB (ref 15–41)
Alkaline Phosphatase: 110 U/L (ref 38–126)
BILIRUBIN DIRECT: 0.2 mg/dL (ref 0.1–0.5)
BILIRUBIN TOTAL: 0.7 mg/dL (ref 0.3–1.2)
Indirect Bilirubin: 0.5 mg/dL (ref 0.3–0.9)
Total Protein: 5.7 g/dL — ABNORMAL LOW (ref 6.5–8.1)

## 2016-10-23 LAB — PREPARE RBC (CROSSMATCH)

## 2016-10-23 LAB — GLUCOSE, CAPILLARY: GLUCOSE-CAPILLARY: 111 mg/dL — AB (ref 65–99)

## 2016-10-23 LAB — OCCULT BLOOD X 1 CARD TO LAB, STOOL: Fecal Occult Bld: NEGATIVE

## 2016-10-23 LAB — ABO/RH: ABO/RH(D): O POS

## 2016-10-23 LAB — PTH-RELATED PEPTIDE: PTH-related peptide: <1.1 pmol/L

## 2016-10-23 LAB — MAGNESIUM: Magnesium: 1.6 mg/dL — ABNORMAL LOW (ref 1.7–2.4)

## 2016-10-23 MED ORDER — SODIUM CHLORIDE 0.9 % IV SOLN: Freq: Once | INTRAVENOUS | Status: AC

## 2016-10-23 MED ORDER — SODIUM CHLORIDE 0.9 % IV SOLN: Freq: Once | INTRAVENOUS | Status: DC

## 2016-10-23 MED ORDER — SODIUM CHLORIDE 0.9 % IV SOLN
Freq: Once | INTRAVENOUS | Status: AC
  Administered 2016-10-23: 10:00:00 via INTRAVENOUS

## 2016-10-23 MED ORDER — MAGNESIUM SULFATE 2 GM/50ML IV SOLN
2.0000 g | Freq: Once | INTRAVENOUS | Status: AC
  Administered 2016-10-23: 2 g via INTRAVENOUS
  Filled 2016-10-23: qty 50

## 2016-10-23 NOTE — Progress Notes (Signed)
CRITICAL VALUE ALERT  Critical value received:  Hgb 6.5  Date of notification:  10/23/16  Time of notification:  3943  Critical value read back:Yes.    Nurse who received alert:  Lattie Haw   MD notified (1st page):  Fuller Plan  Time of first page:  0550  MD notified (2nd page):  Time of second page:  Responding MD:  Fuller Plan  Time MD responded:  [phone]

## 2016-10-23 NOTE — Progress Notes (Signed)
PROGRESS NOTE  [name] QBV:[id number] DOB: 1930/11/15 DOA: 10/18/2016 PCP: [name], MD  HPI/Recap of past 24 hours:  Sitting in chair , c/o left shoulder pain hgb dropped to 6.5, no overt bleed Less confused, she is aaox3 the first time since admission Family in room  Assessment/Plan: Principal Problem:   Acute encephalopathy Active Problems:   Acute renal failure superimposed on stage 3 chronic kidney disease (HCC)   HLD (hyperlipidemia)   HTN (hypertension)   Vertigo   Anemia   Back pain   Hypercalcemia  Hypercalcemia: ca > 15 on admission PTH wnl, PTH related peptide unremakable, vitamin D level high normal, urine calcium 17.8, upep pending, spep with small mspike,  Kappa/lambda light chair pending, skeletal survey result noted, oncology consulted, patient had bone marrow biopsy on 3/9, biopsy result pending She received hydration initially, ivf stopped due to concerning for developing pulmonary edema ,  She received calcitonin for total of three days, last on 3/10, Calcium continue to improve, ca 11.2 on 3/11  consider bisphosphonate once cr normalized  Metabolic encephalopathy:  Likely from hypercalcemia, ua no infection Recent ct head a two weeks ago , no acute findings, consider mri brain If mental status dose not improve with correcting calcium. Mental status improving  N/V, unclear etiology, speech eval, no significant swallow problem, family report patient started to vomit a few minutes after eating, she seems to have trouble with solid, no problem with meds or liquid  kub unremarkable,  dg esophagus on monday  Hypokalemia/hypomagnesemia: replace k/mag  AKI on CKDIII, likely from dehydration, cr improved with hydration, ivf held due to pleural effusion,  spep with small m spike, upep pending   Anemia, macrocytic,  recent b12/folate/iron study wnl , tsh 1.7,  Spep/upep as above, bone marrow biopsy result pending hgb dropped to 6.5 fobt  pending prbc transfusion  HLD:  -hold Zetia due to AMS  HTN: -oral Bp meds held initially due to confusion, resumed with improved mental status -IV hydralazine when necessary  Vertigo: -Hold Meclizen  T2 and T4 vertebral compression fracture:  I have discussed with neurosurgery Dr Saintclair Halsted who recommended conservative management with back braces if needed. Currently patient denies pain  Healing nondisplaced fracture of the left clavicular head with severe surrounding callus formation and soft tissue swelling likely accounting for the palpable abnormality. Supportive measures  FTT, family states can provide 24/7 care  Code Status: full  Family Communication: patient and daughtera at bedside  Disposition Plan: home with home health in a few days, patient has 24/7 care at home, family donot want snf.   Consultants:  Oncology  Neurosurgery phone conversation with Dr Saintclair Halsted  Procedures:  Bone marrow biopsy on 3/9  Antibiotics:  none   Objective: BP (!) 127/54 (BP Location: Right Arm)   Pulse 98   Temp 99.2 F (37.3 C) (Oral)   Resp 18   Ht 5' (1.524 m)   Wt 63.1 kg (139 lb 1.8 oz)   SpO2 96%   BMI 27.17 kg/m   Intake/Output Summary (Last 24 hours) at 10/23/16 0935 Last data filed at 10/23/16 0315  Gross per 24 hour  Intake              120 ml  Output              500 ml  Net             -380 ml   Filed Weights   10/18/16 2251  Weight: 63.1 kg (139 lb 1.8 oz)    Exam:   General:  Very Frail, lethargic, less confused, but NAD  Cardiovascular: RRR  Respiratory: CTABL  Abdomen: Soft/ND/NT, positive BS  Musculoskeletal: No Edema  Neuro:less confused, following commands  Data Reviewed: Basic Metabolic Panel:  Recent Labs Lab 10/19/16 0637 10/20/16 0903 10/21/16 1226 10/22/16 0544 10/23/16 0519  NA 146* 147* 144 142 137  K 3.4* 3.5 2.9* 4.0 3.8  CL 110 112* 110 111 106  CO2 26 25 25 25 24   GLUCOSE 114* 128* 210* 116* 102*  BUN 26* 26*  25* 24* 23*  CREATININE 1.54* 1.43* 1.37* 1.26* 1.15*  CALCIUM 13.7* 12.7* 11.8* 11.5* 11.2*  MG  --   --   --  1.2* 1.6*   Liver Function Tests:  Recent Labs Lab 10/18/16 1942 10/23/16 0519  AST 24 14*  ALT 10* 8*  ALKPHOS 150* 110  BILITOT 1.1 0.7  PROT 6.3* 5.7*  ALBUMIN 3.7 3.2*   No results for input(s): LIPASE, AMYLASE in the last 168 hours. No results for input(s): AMMONIA in the last 168 hours. CBC:  Recent Labs Lab 10/18/16 1942 10/23/16 0519  WBC 8.7 8.6  NEUTROABS 4.5 3.0  HGB 8.0* 6.5*  HCT 23.1* 18.8*  MCV 103.1* 101.6*  PLT 173 195   Cardiac Enzymes:   No results for input(s): CKTOTAL, CKMB, CKMBINDEX, TROPONINI in the last 168 hours. BNP (last 3 results) No results for input(s): BNP in the last 8760 hours.  ProBNP (last 3 results) No results for input(s): PROBNP in the last 8760 hours.  CBG:  Recent Labs Lab 10/19/16 0742 10/20/16 0755 10/21/16 0727 10/22/16 0755 10/23/16 0813  GLUCAP 108* 122* 125* 117* 111*    Recent Results (from the past 240 hour(s))  Urine culture     Status: Abnormal   Collection Time: 10/19/16  7:55 AM  Result Value Ref Range Status   Specimen Description URINE, CLEAN CATCH  Final   Special Requests NONE  Final   Culture MULTIPLE SPECIES PRESENT, SUGGEST RECOLLECTION (A)  Final   Report Status 10/20/2016 FINAL  Final     Studies: Dg Abd 1 View  Result Date: 10/22/2016 CLINICAL DATA:  N/V, family report patient started to vomit a few minutes after eating, she seems to have trouble with solid, not problem with meds or liquid. H/o CKD stage III, Hiatal hernia, upper GI bleed in 2005. Surgical h/o cholecystectomy in 2009. EXAM: ABDOMEN - 1 VIEW COMPARISON:  None. FINDINGS: Nonobstructive bowel gas pattern. No pneumoperitoneum, pneumatosis or portal venous gas. Limited visualization of lower thorax is normal. Moderate multilevel thoracolumbar spine degenerative change, incompletely evaluated. Post lower thoracic  vertebral body augmentation, incompletely evaluated. Post cholecystectomy. IMPRESSION: Nonobstructive bowel gas pattern. Electronically Signed   By: Sandi Mariscal M.D.   On: 10/22/2016 14:31   Dg Chest Port 1 View  Result Date: 10/22/2016 CLINICAL DATA:  Cough EXAM: PORTABLE CHEST 1 VIEW COMPARISON:  10/19/2016 FINDINGS: Cardiac shadow is enlarged. Aortic calcifications are again seen. Changes of prior vertebral augmentation are noted. The lungs are clear with the exception of nodular appearing density in the left upper lobe which corresponds to some pleural-based infiltrate IMPRESSION: Stable changes when compared with recent CT. No acute abnormality noted. Electronically Signed   By: Inez Catalina M.D.   On: 10/22/2016 14:45    Scheduled Meds: . sodium chloride   Intravenous Once  . sodium chloride   Intravenous Once  . sodium chloride  Intravenous Once  . amLODipine  5 mg Oral Daily  . cycloSPORINE  1 drop Both Eyes Daily  . enoxaparin (LOVENOX) injection  30 mg Subcutaneous QHS  . feeding supplement (ENSURE ENLIVE)  237 mL Oral BID BM  . magnesium sulfate 1 - 4 g bolus IVPB  2 g Intravenous Once  . sodium chloride flush  3 mL Intravenous Q12H    Continuous Infusions:    Time spent: 96mns  Carmeron Heady MD, PhD  [facility] Pager [phone] If 7PM-7AM, please contact night-coverage at [email or web address], password [email or web address]3/06/2017, 9:35 AM  LOS: 5 days

## 2016-10-23 NOTE — Progress Notes (Signed)
OT Cancellation Note  Patient Details Name: BRITTNEY MUCHA MRN: [id number] DOB: [birth year]   Cancelled Treatment:    Reason Eval/Treat Not Completed: Medical issues which prohibited therapy.  Hgb 6.5. Will check back tomorrow.  Peregrine Nolt 10/23/2016, 10:10 AM  Lesle Chris, OTR/L [phone] 10/23/2016

## 2016-10-24 ENCOUNTER — Inpatient Hospital Stay (HOSPITAL_COMMUNITY): Payer: Medicare Other

## 2016-10-24 ENCOUNTER — Encounter (HOSPITAL_COMMUNITY): Payer: Self-pay | Admitting: Physician Assistant

## 2016-10-24 LAB — KAPPA/LAMBDA LIGHT CHAINS
Kappa free light chain: 14.8 mg/L (ref 3.3–19.4)
Kappa, lambda light chain ratio: 0 — ABNORMAL LOW (ref 0.26–1.65)
Lambda free light chains: 5362.1 mg/L — ABNORMAL HIGH (ref 5.7–26.3)

## 2016-10-24 LAB — BASIC METABOLIC PANEL
Anion gap: 8 (ref 5–15)
BUN: 25 mg/dL — ABNORMAL HIGH (ref 6–20)
CALCIUM: 11.7 mg/dL — AB (ref 8.9–10.3)
CHLORIDE: 104 mmol/L (ref 101–111)
CO2: 25 mmol/L (ref 22–32)
CREATININE: 1.07 mg/dL — AB (ref 0.44–1.00)
GFR calc Af Amer: 54 mL/min — ABNORMAL LOW (ref >60)
GFR calc non Af Amer: 46 mL/min — ABNORMAL LOW (ref >60)
GLUCOSE: 113 mg/dL — AB (ref 65–99)
Potassium: 3.7 mmol/L (ref 3.5–5.1)
Sodium: 137 mmol/L (ref 135–145)

## 2016-10-24 LAB — UIFE/LIGHT CHAINS/TP QN, 24-HR UR
% BETA, Urine: 80.9 %
ALBUMIN, U: 8.4 %
ALPHA 1 URINE: 2.9 %
ALPHA 2 UR: 6.9 %
FREE LAMBDA LT CHAINS, UR: 1570 mg/L — AB (ref 0.24–6.66)
FREE LT CHN EXCR RATE: 95.3 mg/L — AB (ref 1.35–24.19)
Free Kappa/Lambda Ratio: 0.06 — ABNORMAL LOW (ref 2.04–10.37)
GAMMA GLOBULIN URINE: 0.9 %
M-SPIKE %, Urine: 77.9 % — ABNORMAL HIGH
M-Spike, Mg/24 Hr: 1140 mg/24 hr — ABNORMAL HIGH
TOTAL VOLUME: 1200
Time: 24 hours
Total Protein, Urine-Ur/day: 1463 mg/24 hr — ABNORMAL HIGH (ref 30–150)
Total Protein, Urine: 121.9 mg/dL
Volume, Urine: 1200 mL

## 2016-10-24 LAB — BPAM RBC
Blood Product Expiration Date: 201804062359
ISSUE DATE / TIME: 201803111233
Unit Type and Rh: 5100

## 2016-10-24 LAB — GLUCOSE, CAPILLARY: GLUCOSE-CAPILLARY: 125 mg/dL — AB (ref 65–99)

## 2016-10-24 LAB — TYPE AND SCREEN
ABO/RH(D): O POS
ANTIBODY SCREEN: NEGATIVE
Unit division: 0

## 2016-10-24 LAB — MAGNESIUM: Magnesium: 2 mg/dL (ref 1.7–2.4)

## 2016-10-24 MED ORDER — SODIUM CHLORIDE 0.9 % IV SOLN
60.0000 mg | Freq: Once | INTRAVENOUS | Status: AC
  Administered 2016-10-24: 60 mg via INTRAVENOUS
  Filled 2016-10-24: qty 20

## 2016-10-24 MED ORDER — SODIUM CHLORIDE 0.9 % IV SOLN
INTRAVENOUS | Status: DC
  Administered 2016-10-24: 18:00:00 via INTRAVENOUS

## 2016-10-24 MED ORDER — ENOXAPARIN SODIUM 30 MG/0.3ML ~~LOC~~ SOLN: 30.0000 mg | Freq: Every day | SUBCUTANEOUS | Status: DC

## 2016-10-24 NOTE — Progress Notes (Signed)
PROGRESS NOTE  Kristin [name]:[id number] DOB: 07-17-1931 DOA: 10/18/2016 PCP: [name], MD  HPI/Recap of past 24 hours:  In bed, c/o left shoulder pain Less confused, she is aaox3 since 3/11 Family in room  Assessment/Plan: Principal Problem:   Acute encephalopathy Active Problems:   Acute renal failure superimposed on stage 3 chronic kidney disease (HCC)   HLD (hyperlipidemia)   HTN (hypertension)   Vertigo   Anemia   Back pain   Hypercalcemia  Hypercalcemia:  ca > 15 on admission PTH wnl, PTH related peptide unremakable, vitamin D level high normal, urine calcium 17.8, upep pending, spep with small mspike,  Kappa/lambda light chair pending, skeletal survey result noted, oncology consulted,  bone marrow biopsy on 3/9, biopsy result pending She received hydration initially, ivf stopped due to concerning for developing pulmonary edema ,  She received calcitonin for total of three days, last on 3/10, Calcium continue to improve, ca  nadir at 11.2 on 3/11, ca 11.7 on 3/12,  pamidronate 76m x1 iv on 35/45 Metabolic encephalopathy:  Likely from hypercalcemia, ua no infection Recent ct head a two weeks ago , no acute findings, consider mri brain If mental status dose not improve with correcting calcium. Mental status improving, aaox3 since 3/11  N/V, unclear etiology, speech eval, no significant swallow problem,  family report patient started to vomit a few minutes after eating, she seems to have trouble with solid, no problem with meds or liquid  kub unremarkable,  dg esophagus  Review esophagus dysmotility, Hiatal hernia with potential gastric diverticulum, no high grade obstruction. Patient is encouraged to have smaller meals each time and multiple time a day, she needs to sit up eating  Hypokalemia/hypomagnesemia: replace k/mag  AKI on CKDIII, likely from dehydration, cr improved with hydration, ivf held due to pleural effusion,  spep with small m spike, upep  pending  Anemia, macrocytic,  recent b12/folate/iron study wnl , tsh 1.7,  fobt negative, Spep/upep as above, bone marrow biopsy result pending hgb dropped to 6.5, prbc transfusion x1 on 3/11 Hematology/oncology input appreciated  HLD:  -hold Zetia due to AMS  HTN: -oral Bp meds held initially due to confusion, resumed with improved mental status -IV hydralazine when necessary  Vertigo: -Hold Meclizen  T2 and T4 vertebral compression fracture:  I have discussed with neurosurgery Dr CSaintclair Halstedwho recommended conservative management with back braces if needed. Currently patient denies pain  Healing nondisplaced fracture of the left clavicular head with severe surrounding callus formation and soft tissue swelling likely accounting for the palpable abnormality. Supportive measures, back strap and shoulder sling  FTT, family states can provide 24/7 care  Code Status: full  Family Communication: patient and daughters at bedside  Disposition Plan: home with home health in a few days, patient has 24/7 care at home, family donot want snf.   Consultants:  Oncology  Neurosurgery phone conversation with Dr CSaintclair Halsted Procedures:  Bone marrow biopsy on 3/9  Antibiotics:  none   Objective: BP (!) 134/56 (BP Location: Left Arm)   Pulse 95   Temp 97.9 F (36.6 C) (Oral)   Resp (!) 22   Ht 5' (1.524 m)   Wt 63.1 kg (139 lb 1.8 oz)   SpO2 95%   BMI 27.17 kg/m   Intake/Output Summary (Last 24 hours) at 10/24/16 1501 Last data filed at 10/24/16 0900  Gross per 24 hour  Intake              545  ml  Output              100 ml  Net              445 ml   Filed Weights   10/18/16 2251  Weight: 63.1 kg (139 lb 1.8 oz)    Exam:   General:  Very Frail, lethargic, less confused, but NAD  Cardiovascular: RRR  Respiratory: CTABL  Abdomen: Soft/ND/NT, positive BS  Musculoskeletal: No Edema  Neuro: less confused, following commands  Data Reviewed: Basic Metabolic  Panel:  Recent Labs Lab 10/20/16 0903 10/21/16 1226 10/22/16 0544 10/23/16 0519 10/24/16 0537  NA 147* 144 142 137 137  K 3.5 2.9* 4.0 3.8 3.7  CL 112* 110 111 106 104  CO2 25 25 25 24 25   GLUCOSE 128* 210* 116* 102* 113*  BUN 26* 25* 24* 23* 25*  CREATININE 1.43* 1.37* 1.26* 1.15* 1.07*  CALCIUM 12.7* 11.8* 11.5* 11.2* 11.7*  MG  --   --  1.2* 1.6* 2.0   Liver Function Tests:  Recent Labs Lab 10/18/16 1942 10/23/16 0519  AST 24 14*  ALT 10* 8*  ALKPHOS 150* 110  BILITOT 1.1 0.7  PROT 6.3* 5.7*  ALBUMIN 3.7 3.2*   No results for input(s): LIPASE, AMYLASE in the last 168 hours. No results for input(s): AMMONIA in the last 168 hours. CBC:  Recent Labs Lab 10/18/16 1942 10/23/16 0519 10/23/16 2038 10/24/16 0537  WBC 8.7 8.6  --  9.4  NEUTROABS 4.5 3.0  --   --   HGB 8.0* 6.5* 8.4* 7.9*  HCT 23.1* 18.8* 25.1* 23.1*  MCV 103.1* 101.6*  --  92.0  PLT 173 195  --  192   Cardiac Enzymes:   No results for input(s): CKTOTAL, CKMB, CKMBINDEX, TROPONINI in the last 168 hours. BNP (last 3 results) No results for input(s): BNP in the last 8760 hours.  ProBNP (last 3 results) No results for input(s): PROBNP in the last 8760 hours.  CBG:  Recent Labs Lab 10/20/16 0755 10/21/16 0727 10/22/16 0755 10/23/16 0813 10/24/16 0752  GLUCAP 122* 125* 117* 111* 125*    Recent Results (from the past 240 hour(s))  Urine culture     Status: Abnormal   Collection Time: 10/19/16  7:55 AM  Result Value Ref Range Status   Specimen Description URINE, CLEAN CATCH  Final   Special Requests NONE  Final   Culture MULTIPLE SPECIES PRESENT, SUGGEST RECOLLECTION (A)  Final   Report Status 10/20/2016 FINAL  Final     Studies: Dg Esophagus  Result Date: 10/24/2016 CLINICAL DATA:  Difficulty swallowing pre nausea vomiting EXAM: ESOPHOGRAM/BARIUM SWALLOW TECHNIQUE: Single contrast examination was performed using  thin. FLUOROSCOPY TIME:  Fluoroscopy Time:  2 minutes 30 seconds  Radiation Exposure Index (if provided by the fluoroscopic device): 25.0 mGy Number of Acquired Spot Images: 10 COMPARISON:  CT 06/20/2016, 10/19/2016 FINDINGS: Limited exam due to patient immobility Esophagitis is mildly patulous. Contrast flows through the esophagus and GE junction without high-grade obstruction. There is poor esophageal motility with tertiary contractions. Hiatal hernia noted. 13 mm tablet was administered. Barium tablet traveled into the hiatal hernia sac. Potential gastric diverticulum retains barium tablet. IMPRESSION: 1. Esophageal dysmotility. 2. Hiatal hernia with potential gastric diverticulum. Hiatal hernia versus diverticulum retains the barium tablet. 3. No high-grade obstruction. Electronically Signed   By: Suzy Bouchard M.D.   On: 10/24/2016 13:08    Scheduled Meds: . sodium chloride   Intravenous Once  .  amLODipine  5 mg Oral Daily  . cycloSPORINE  1 drop Both Eyes Daily  . [START ON 10/25/2016] enoxaparin (LOVENOX) injection  30 mg Subcutaneous QHS  . feeding supplement (ENSURE ENLIVE)  237 mL Oral BID BM  . pamidronate  60 mg Intravenous Once  . sodium chloride flush  3 mL Intravenous Q12H    Continuous Infusions: . sodium chloride       Time spent: 54mns  Lakaisha Danish MD, PhD  [facility] Pager [phone] If 7PM-7AM, please contact night-coverage at [email or web address], password [email or web address]3/07/2017, 3:01 PM  LOS: 6 days

## 2016-10-24 NOTE — H&P (Signed)
Chief Complaint: Acute mental status change  Referring Physician(s): Truitt Merle  Supervising Physician: Daryll Brod  Patient Status: [facility] - In-pt  History of Present Illness: Kristin [name] is a 83 y.o. female with past medical history of hypertension, chronic back pain, [name] stage III, who presented with altered mental status.   She was admitted for hypercalcemia.  All history taken from chart review and family today.   Prior to admission she had been lethargic and confused for the past week.   She has issues with mobility.  The family reports the patient fell last October, and her PCP suspected she broke her back. She received a cortisone injection in January, which was followed by onset of pain and decreased mobility.  She was subsequently admitted to [name], where the family reports they discovered she was being given someone else's medicine.   This led to the patient's confused mental status and difficulty with ambulation.   She apparently fell again while in [name], and she was brought back to home by her family.   In the ED, she was found to have calcium more than 15, she was admitted for further management.  She received calcitonin, her calcium came down to 12.7 today.   Her SPEP was positive for M protein-0.4 g/dl, possible represent a monoclonal gammopathy.   We are asked to perform a bone marrow biopsy.  Past Medical History:  Diagnosis Date  . Allergy   . Arthritis   . Chronic kidney disease (CKD), stage III (moderate)    Archie Endo 09/23/2016  . Depression    Risperdal "twice/day" (09/23/2016)  . Glaucoma   . Hiatal hernia   . Hypertension   . Intractable back pain    /notes 09/23/2016  . Obesity   . Osteoporosis    vertebral fracture in 2012  . Upper GI bleed 04/2004   secondary to Mallory-Weiss tear/notes 12/28/2010  . Vertigo     Past Surgical History:  Procedure Laterality Date  . ABDOMINAL HYSTERECTOMY  1974   partial  . APPENDECTOMY     as a child/notes 12/28/2010  . BACK SURGERY    . BREAST BIOPSY Left 1999   Archie Endo 12/28/2010  . CATARACT EXTRACTION, BILATERAL Bilateral 2000s  . ESOPHAGOGASTRODUODENOSCOPY  04/2004   Archie Endo 12/28/2010  . KYPHOPLASTY  05/2011   T10/notes 05/25/2011  . LAPAROSCOPIC CHOLECYSTECTOMY  10/2007   Archie Endo 12/14/2010    Allergies: Alendronate; Penicillins; and Sulfonamide derivatives  Medications: Prior to Admission medications   Medication Sig Start Date End Date Taking? Authorizing Provider  amLODipine (NORVASC) 5 MG tablet Take 1 tablet (5 mg total) by mouth daily. 02/19/14  Yes Cherre Robins, PharmD  ezetimibe (ZETIA) 10 MG tablet Take 1 tablet (10 mg total) by mouth daily. 04/05/13  Yes [name], MD  losartan-hydrochlorothiazide (HYZAAR) 50-12.5 MG tablet Take 1 tablet by mouth daily. 08/18/16  Yes Historical Provider, MD  meclizine (ANTIVERT) 25 MG tablet TAKE ONE TABLET BY MOUTH THREE TIMES DAILY AS NEEDED Patient taking differently: Take 25 mg by mouth 3 (three) times daily as needed for dizziness. TAKE ONE TABLET BY MOUTH THREE TIMES DAILY AS NEEDED 06/20/14  Yes Chipper Herb, MD  Multiple Vitamins-Minerals (CENTRUM SILVER PO) Take 1 tablet by mouth daily.    Yes Historical Provider, MD  oxyCODONE-acetaminophen (PERCOCET/ROXICET) 5-325 MG tablet Take 1 tablet by mouth every 4 (four) hours. 09/27/16  Yes Florencia Reasons, MD  polyethylene glycol powder (GLYCOLAX/MIRALAX) powder Take 17 g by  mouth daily. 07/05/16  Yes Historical Provider, MD  RESTASIS 0.05 % ophthalmic emulsion Place 1 drop into both eyes daily.  01/31/13  Yes Historical Provider, MD  risperiDONE (RISPERDAL) 0.5 MG tablet Take 1 tablet (0.5 mg total) by mouth 2 (two) times daily. 06/21/16  Yes Debbe Odea, MD  traMADol (ULTRAM) 50 MG tablet Take 50 mg by mouth every 6 (six) hours as needed.   Yes Historical Provider, MD  bisacodyl (DULCOLAX) 10 MG suppository Place 1 suppository (10 mg total) rectally daily  as needed for moderate constipation. Patient not taking: Reported on 10/18/2016 06/21/16   Debbe Odea, MD  ibandronate (BONIVA) 150 MG tablet Take 1 tablet (150 mg total) by mouth every 30 (thirty) days. Take in the morning with a full glass of water, on an empty stomach, and do not take anything else by mouth or lie down for the next 30 min. 02/19/14   Cherre Robins, PharmD     Family History  Problem Relation Age of Onset  . Hypertension Mother   . Osteoporosis Mother   . CVA Mother   . Cancer Neg Hx     Social History   Social History  . Marital status: Widowed    Spouse name: N/A  . Number of children: N/A  . Years of education: N/A   Social History Main Topics  . Smoking status: Never Smoker  . Smokeless tobacco: Never Used  . Alcohol use Yes     Comment: 09/23/2016 "glass of wine a few times/year"  . Drug use: No  . Sexual activity: No   Other Topics Concern  . None   Social History Narrative  . None    Review of Systems: A 12 point ROS discussed  Review of Systems  Constitutional: Positive for activity change and fatigue. Negative for appetite change, chills and fever.  HENT: Negative.   Respiratory: Negative.   Cardiovascular: Negative.   Gastrointestinal: Negative.   Genitourinary: Negative.   Musculoskeletal: Negative.   Skin: Negative.   Neurological:       Mental status changes  Hematological: Negative.   Psychiatric/Behavioral: Positive for confusion.    Vital Signs: BP 124/69 (BP Location: Left Arm)   Pulse 85   Temp 99 F (37.2 C) (Oral)   Resp (!) 24   Ht 5' (1.524 m)   Wt 139 lb 1.8 oz (63.1 kg)   SpO2 97%   BMI 27.17 kg/m   Physical Exam  Constitutional: She is oriented to person, place, and time. She appears well-developed and well-nourished.  HENT:  Head: Normocephalic and atraumatic.  Eyes: EOM are normal.  Neck: Normal range of motion.  Cardiovascular: Normal rate, regular rhythm and normal heart sounds.   Pulmonary/Chest: Effort  normal and breath sounds normal. No respiratory distress. She has no wheezes.  Abdominal: Soft. She exhibits no distension. There is no tenderness.  Musculoskeletal:  Issues with mobilty  Neurological: She is alert and oriented to person, place, and time.  Skin: Skin is warm and dry.  Psychiatric: She has a normal mood and affect. Her behavior is normal. Judgment and thought content normal.  Vitals reviewed.   Mallampati Score:  MD Evaluation Airway: WNL Heart: WNL Abdomen: WNL Chest/ Lungs: WNL ASA  Classification: 3 Mallampati/Airway Score: Two  Imaging: Dg Chest 2 View  Result Date: 10/18/2016 CLINICAL DATA:  Initial evaluation for acute altered mental status, recent fall. EXAM: CHEST  2 VIEW COMPARISON:  Prior radiograph from 10/02/2016. FINDINGS: Moderate cardiomegaly, stable. Mediastinal silhouette  within normal limits. Aortic atherosclerosis with tortuosity the intrathoracic aorta. Lungs are hypoinflated. Diffusely increased vascular congestion with interstitial prominence, suggesting pulmonary interstitial congestion/ edema. Increased opacity at the right lung base may reflect atelectasis/ edema or infiltrate. No other focal infiltrates. No pneumothorax. No acute osseous abnormality. Patient status post vertebral augmentation at T9. Degenerative changes noted about the shoulders bilaterally. IMPRESSION: 1. Stable cardiomegaly with diffuse increased vascular congestion and interstitial prominence, suggesting pulmonary interstitial edema. 2. More confluent opacity at the right lung base. While this finding may reflect atelectasis and/or edema/congestion, possible infiltrate could also be considered. 3. Aortic atherosclerosis. Electronically Signed   By: Jeannine Boga M.D.   On: 10/18/2016 22:02   Dg Ribs Unilateral W/chest Left  Result Date: 10/02/2016 CLINICAL DATA:  Pain following fall EXAM: LEFT RIBS AND CHEST - 3+ VIEW COMPARISON:  September 23, 2016 chest radiograph  FINDINGS: Frontal chest as well as oblique and cone-down lower rib images obtained. There is no edema or consolidation. Heart is mildly enlarged with pulmonary vascular within normal limits. There is aortic atherosclerosis. No adenopathy. Patient is status post kyphoplasty procedure at T9. There is degenerative change in the thoracic spine. There is no pneumothorax or pleural effusion. No evident rib fracture. IMPRESSION: No evident rib fracture. Lungs clear. Stable cardiomegaly. There is aortic atherosclerosis. Status post kyphoplasty procedure at T9. Electronically Signed   By: Lowella Grip III M.D.   On: 10/02/2016 13:48   Dg Wrist Complete Left  Result Date: 10/02/2016 CLINICAL DATA:  [place] today, BILATERAL wrist pain, BILATERAL hip pain EXAM: LEFT WRIST - COMPLETE 3+ VIEW COMPARISON:  None FINDINGS: Osseous demineralization. Degenerative changes at first [facility] [place] joint. Widened scapholunate interval consistent with scapholunate ligament tear. Degenerative changes at the distal radioulnar joint. No acute fracture, dislocation, or bone destruction. IMPRESSION: Osseous demineralization with degenerative changes at first [facility] joint and distal radioulnar joint. Torn scapholunate ligament. No acute osseous abnormalities. Electronically Signed   By: Lavonia Dana M.D.   On: 10/02/2016 13:49   Dg Wrist Complete Right  Result Date: 10/02/2016 CLINICAL DATA:  [place] today, BILATERAL wrist pain, BILATERAL hip pain EXAM: RIGHT WRIST - COMPLETE 3+ VIEW COMPARISON:  None FINDINGS: Osseous demineralization. Widened scapholunate interval consistent with torn scapholunate ligament. No acute fracture, dislocation, or bone destruction. Atherosclerotic calcifications within the radial artery. IMPRESSION: Osseous demineralization with evidence of a torn scapholunate ligament. No acute abnormalities. Electronically Signed   By: Lavonia Dana M.D.   On: 10/02/2016 13:49   Dg Abd 1 View  Result Date: 10/22/2016 CLINICAL DATA:   N/V, family report patient started to vomit a few minutes after eating, she seems to have trouble with solid, not problem with meds or liquid. H/o CKD stage III, Hiatal hernia, upper GI bleed in 2005. Surgical h/o cholecystectomy in 2009. EXAM: ABDOMEN - 1 VIEW COMPARISON:  None. FINDINGS: Nonobstructive bowel gas pattern. No pneumoperitoneum, pneumatosis or portal venous gas. Limited visualization of lower thorax is normal. Moderate multilevel thoracolumbar spine degenerative change, incompletely evaluated. Post lower thoracic vertebral body augmentation, incompletely evaluated. Post cholecystectomy. IMPRESSION: Nonobstructive bowel gas pattern. Electronically Signed   By: Sandi Mariscal M.D.   On: 10/22/2016 14:31   Ct Head Wo Contrast  Result Date: 10/02/2016 CLINICAL DATA:  Pain following fall EXAM: CT HEAD WITHOUT CONTRAST CT CERVICAL SPINE WITHOUT CONTRAST TECHNIQUE: Multidetector CT imaging of the head and cervical spine was performed following the standard protocol without intravenous contrast. Multiplanar CT image reconstructions of the cervical spine were also  generated. COMPARISON:  Head CT February 10, 2011; cervical spine CT April 26, 2007 FINDINGS: CT HEAD FINDINGS Brain: There is mild diffuse atrophy. There is no intracranial mass, hemorrhage, extra-axial fluid collection, or midline shift. There is mild patchy small vessel disease in the centra semiovale bilaterally. Elsewhere gray-white compartments appear normal. No acute infarct evident. Vascular: No hyperdense vessels. There is calcification in both carotid siphon regions bilaterally. Skull: There is a state apparent either calcified meningioma or enostosis arising from the posterior left frontal region measuring 2.5 x 1.4 cm, stable. There is no surrounding edema or mass effect in this area. No fracture evident. Bones are somewhat osteopenic. Sinuses/Orbits: There is mild mucosal thickening in several ethmoid air cells bilaterally. Visualized  paranasal sinuses elsewhere clear. Orbits appear symmetric bilaterally. Other: There are scattered areas of inferior mastoid air cell opacity bilaterally. Most of the mastoids bilaterally are clear. CT CERVICAL SPINE FINDINGS Alignment: There is no spondylolisthesis. Skull base and vertebrae: Skull base and craniocervical junction regions appear normal. Bones are osteoporotic. There is no demonstrable acute fracture. An oblique lucency in the posterior inferior odontoid is stable compared to 2008 study is felt to represent a prominent nutrient foramen presence of osteoporosis. There are no blastic or lytic bone lesions. Soft tissues and spinal canal: Prevertebral soft tissues and predental space regions are normal. There is no paraspinous lesion. There is no cord or canal hematoma demonstrable. No spinal stenosis. Disc levels: There is marked disc space narrowing at C3-4, C5-6, and C6-7. There is moderate disc space narrowing at C4-5. There is facet hypertrophy at multiple levels. No disc extrusion evident. Upper chest: Limited visualization of the lung apices. Visualized regions clear. Other: There is carotid artery calcification on the left. IMPRESSION: CT head: Stable atrophy with mild periventricular small vessel disease. Areas of mastoid disease bilaterally. Mild mucosal thickening in several ethmoid air cells noted. No fracture. Benign calcified meningioma versus enostosis in the posterior left frontal region without mass effect or edema. This area appears benign and stable and is not felt to have clinical significance. Foci of arterial vascular calcification noted. CT cervical spine: No fracture or spondylolisthesis. Bones osteoporotic. There is multilevel osteoarthritic change. There is mild calcification in the left carotid artery. Electronically Signed   By: Lowella Grip III M.D.   On: 10/02/2016 14:32   Ct Soft Tissue Neck Wo Contrast  Result Date: 10/19/2016 CLINICAL DATA:  Fall 10/02/2016. Left  shoulder pain and swelling. Dysphagia. EXAM: CT NECK WITHOUT CONTRAST TECHNIQUE: Multidetector CT imaging of the neck was performed following the standard protocol without intravenous contrast. COMPARISON:  CT cervical spine 10/02/2016 FINDINGS: Pharynx and larynx: Negative Salivary glands: Negative Thyroid: Negative Lymph nodes: No pathologic lymph nodes in the neck. Vascular: Limited evaluation due to lack of intravenous contrast. Calcification in the left internal carotid artery. Limited intracranial: Negative Visualized orbits: Negative Mastoids and visualized paranasal sinuses: Mild mucosal edema left maxillary sinus. Otherwise negative. Skeleton: Fracture of the left medial clavicle with mild displacement. Surrounding soft tissue calcification most compatible with calcifying hematoma. No bony union. There is mild periosteal reaction. Reconstructed sagittal images demonstrate moderate fractures of T2 and T4. These show sclerosis and could be healing fractures. These were not scanned on the prior cervical spine CT. There is moderate disc degeneration and spurring throughout the cervical spine. Upper chest: Chest CT from today reported separately Other: None IMPRESSION: Subacute fracture left medial clavicle. Calcifying hematoma around the fracture site. This accounts for the palpable abnormality. Moderately severe  compression fractures T2 and T4 which may be healing fractures. No mass or soft tissue edema in the neck. Electronically Signed   By: Franchot Gallo M.D.   On: 10/19/2016 15:57   Ct Chest Wo Contrast  Result Date: 10/19/2016 CLINICAL DATA:  Upon assessing pt, a hard, bony prominence noted at along the left throat/clavicle area. EXAM: CT CHEST WITHOUT CONTRAST TECHNIQUE: Multidetector CT imaging of the chest was performed following the standard protocol without IV contrast. COMPARISON:  None. FINDINGS: Bones/Joint/Cartilage Healing nondisplaced fracture of the left clavicular head with severe  surrounding callus formation and soft tissue swelling likely accounting for the palpable abnormality. No sternoclavicular dislocation. Mild osteoarthritis of the right sternoclavicular joint. There is suggestion of a lytic lesion at the fracture site which may be secondary to metastatic disease or multiple myeloma versus osteolysis secondary to the fracture. Mild arthropathy of bilateral acromioclavicular joints. No aggressive lytic or sclerotic osseous lesion. Degenerative disc disease with disc height loss C3-4, C5-6 and C6-7. Mild broad-based disc bulge at C3-4 with bilateral mild facet arthropathy. Bilateral uncovertebral degenerative changes at C4-5 and C5-6 with bilateral facet arthropathy and foraminal stenosis. Broad-based disc osteophyte complex at C6-7 with bilateral facet arthropathy. Bilateral facet arthropathy at C7-T1. T2 vertebral body compression fracture likely acute. Lucent lesion in the left lateral aspect of the T2 vertebral body which may reflect metastatic disease versus multiple myeloma. Chronic T4 vertebral body compression fracture. Ligaments Ligaments are suboptimally evaluated by CT. Muscles and Tendons Muscles are normal.  No muscle atrophy. Soft tissue No fluid collection or hematoma. No soft tissue mass. Small right pleural effusion. IMPRESSION: 1. Healing nondisplaced fracture of the left clavicular head with severe surrounding callus formation and soft tissue swelling likely accounting for the palpable abnormality. There is suggestion of a lytic lesion at the fracture site which may be secondary to metastatic disease or multiple myeloma versus osteolysis secondary to the fracture. 2. Acute T2 vertebral body compression fracture with mild height loss. No retropulsion into the spinal canal. Lucent lesion in the left lateral aspect of the T2 vertebral body which may reflect metastatic disease versus multiple myeloma. Electronically Signed   By: Kathreen Devoid   On: 10/19/2016 15:56   Ct  Cervical Spine Wo Contrast  Result Date: 10/02/2016 CLINICAL DATA:  Pain following fall EXAM: CT HEAD WITHOUT CONTRAST CT CERVICAL SPINE WITHOUT CONTRAST TECHNIQUE: Multidetector CT imaging of the head and cervical spine was performed following the standard protocol without intravenous contrast. Multiplanar CT image reconstructions of the cervical spine were also generated. COMPARISON:  Head CT February 10, 2011; cervical spine CT April 26, 2007 FINDINGS: CT HEAD FINDINGS Brain: There is mild diffuse atrophy. There is no intracranial mass, hemorrhage, extra-axial fluid collection, or midline shift. There is mild patchy small vessel disease in the centra semiovale bilaterally. Elsewhere gray-white compartments appear normal. No acute infarct evident. Vascular: No hyperdense vessels. There is calcification in both carotid siphon regions bilaterally. Skull: There is a state apparent either calcified meningioma or enostosis arising from the posterior left frontal region measuring 2.5 x 1.4 cm, stable. There is no surrounding edema or mass effect in this area. No fracture evident. Bones are somewhat osteopenic. Sinuses/Orbits: There is mild mucosal thickening in several ethmoid air cells bilaterally. Visualized paranasal sinuses elsewhere clear. Orbits appear symmetric bilaterally. Other: There are scattered areas of inferior mastoid air cell opacity bilaterally. Most of the mastoids bilaterally are clear. CT CERVICAL SPINE FINDINGS Alignment: There is no spondylolisthesis. Skull base and  vertebrae: Skull base and craniocervical junction regions appear normal. Bones are osteoporotic. There is no demonstrable acute fracture. An oblique lucency in the posterior inferior odontoid is stable compared to 2008 study is felt to represent a prominent nutrient foramen presence of osteoporosis. There are no blastic or lytic bone lesions. Soft tissues and spinal canal: Prevertebral soft tissues and predental space regions are  normal. There is no paraspinous lesion. There is no cord or canal hematoma demonstrable. No spinal stenosis. Disc levels: There is marked disc space narrowing at C3-4, C5-6, and C6-7. There is moderate disc space narrowing at C4-5. There is facet hypertrophy at multiple levels. No disc extrusion evident. Upper chest: Limited visualization of the lung apices. Visualized regions clear. Other: There is carotid artery calcification on the left. IMPRESSION: CT head: Stable atrophy with mild periventricular small vessel disease. Areas of mastoid disease bilaterally. Mild mucosal thickening in several ethmoid air cells noted. No fracture. Benign calcified meningioma versus enostosis in the posterior left frontal region without mass effect or edema. This area appears benign and stable and is not felt to have clinical significance. Foci of arterial vascular calcification noted. CT cervical spine: No fracture or spondylolisthesis. Bones osteoporotic. There is multilevel osteoarthritic change. There is mild calcification in the left carotid artery. Electronically Signed   By: Lowella Grip III M.D.   On: 10/02/2016 14:32   Mr Lumbar Spine W Wo Contrast  Result Date: 09/27/2016 CLINICAL DATA:  Low back pain. Weakness with inability to ambulate. Loss of bowel and bladder control. Recent spinal steroid injection. EXAM: MRI LUMBAR SPINE WITHOUT AND WITH CONTRAST TECHNIQUE: Multiplanar and multiecho pulse sequences of the lumbar spine were obtained without and with intravenous contrast. CONTRAST:  3m MULTIHANCE GADOBENATE DIMEGLUMINE 529 MG/ML IV SOLN COMPARISON:  09/23/2016 FINDINGS: Segmentation: The lowest fully formed intervertebral disc space is designated L5-S1 as on the recent prior MRI. Alignment: Unchanged alignment with slight retrolisthesis of L1 on L2 and L2 on L3 an 9 mm anterolisthesis of L5 on S1. Mild lumbar dextroscoliosis with apex at L2. Vertebrae: Chronic T10 compression fracture status post  augmentation. Chronic L3 compression fracture without marrow edema. Edema and enhancement are again partially visualized in the right greater than left sacral ala and extending anteriorly across the S2 segment, similar to the recent prior examination. Conus medullaris: Extends to the L2 level and appears normal. Similar appearance of mild dural enhancement extending from L3 into the sacrum. The nerve roots of the cauda equina are displaced predominantly centrally within the thecal sac in a configuration compatible with an underlying subdural fluid collection. This is overall similar in appearance to the recent prior examination, though with slightly increased prominence of the collection at L2-3 and stable to slightly decreased prominence of the collection more inferiorly which may reflect some redistribution. The collection extends inferiorly into the sacral canal, likely extends as far cephalad as the L1-2 level, and is new from an 04/14/2011 MRI. No epidural or paraspinal fluid collection is identified. 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 Paraspinal and other soft tissues: Partially visualized bilateral renal cysts, left larger than right. Similar appearance of extrahepatic biliary dilatation with the common bile duct measuring approximately 13 mm in diameter. Disc levels: Advanced lumbar disc and facet degeneration is unchanged from the recent prior MRI, worst at L4-5 and L5-S1 where there is moderate spinal stenosis and moderate to severe neural foraminal stenosis as previously described. IMPRESSION: 1. Subdural fluid collection in the lumbar and sacral spinal canal, potentially related to  the patient's recent spinal injection. An infected collection is possible, however there is only mild dural enhancement which has not progressed from the recent prior MRI. 2. Similar appearance of marrow edema in the sacrum suggesting insufficiency fractures. 3. Advanced disc and facet degeneration as described on the recent prior  MRI. Electronically Signed   By: Logan Bores M.D.   On: 09/27/2016 12:24   Dg Esophagus  Result Date: 10/24/2016 CLINICAL DATA:  Difficulty swallowing pre nausea vomiting EXAM: ESOPHOGRAM/BARIUM SWALLOW TECHNIQUE: Single contrast examination was performed using  thin. FLUOROSCOPY TIME:  Fluoroscopy Time:  2 minutes 30 seconds Radiation Exposure Index (if provided by the fluoroscopic device): 25.0 mGy Number of Acquired Spot Images: 10 COMPARISON:  CT 06/20/2016, 10/19/2016 FINDINGS: Limited exam due to patient immobility Esophagitis is mildly patulous. Contrast flows through the esophagus and GE junction without high-grade obstruction. There is poor esophageal motility with tertiary contractions. Hiatal hernia noted. 13 mm tablet was administered. Barium tablet traveled into the hiatal hernia sac. Potential gastric diverticulum retains barium tablet. IMPRESSION: 1. Esophageal dysmotility. 2. Hiatal hernia with potential gastric diverticulum. Hiatal hernia versus diverticulum retains the barium tablet. 3. No high-grade obstruction. Electronically Signed   By: Suzy Bouchard M.D.   On: 10/24/2016 13:08   Ct Hip Left Wo Contrast  Result Date: 10/02/2016 CLINICAL DATA:  Fall today with bilateral hip pain. Indeterminate radiographs. EXAM: CT OF THE LEFT HIP WITHOUT CONTRAST TECHNIQUE: Multidetector CT imaging of the left hip was performed according to the standard protocol. Multiplanar CT image reconstructions were also generated. COMPARISON:  Radiograph same date and 03/05/2009. FINDINGS: Bones/Joint/Cartilage This examination is limited to the left hip and inferior left hemipelvis. The mineralization appears adequate. There is no evidence of acute fracture, dislocation or femoral head avascular necrosis. Mild subchondral cyst formation is present within the acetabulum. There are mild degenerative changes of the left sacroiliac joint. There is no significant hip joint effusion. Ligaments Not relevant for  exam/indication. Muscles and Tendons Fatty atrophy within the gluteus minimus muscle. No hematoma or tendon rupture identified. Soft tissues No periarticular hematoma or other fluid collection seen. There is iliofemoral atherosclerosis. IMPRESSION: No evidence of proximal left femur fracture or dislocation. Electronically Signed   By: Richardean Sale M.D.   On: 10/02/2016 16:42   Dg Chest Port 1 View  Result Date: 10/22/2016 CLINICAL DATA:  Cough EXAM: PORTABLE CHEST 1 VIEW COMPARISON:  10/19/2016 FINDINGS: Cardiac shadow is enlarged. Aortic calcifications are again seen. Changes of prior vertebral augmentation are noted. The lungs are clear with the exception of nodular appearing density in the left upper lobe which corresponds to some pleural-based infiltrate IMPRESSION: Stable changes when compared with recent CT. No acute abnormality noted. Electronically Signed   By: Inez Catalina M.D.   On: 10/22/2016 14:45   Dg Shoulder Left  Result Date: 10/18/2016 CLINICAL DATA:  Initial evaluation for acute pain, status post recent fall. EXAM: LEFT SHOULDER - 2+ VIEW COMPARISON:  None available. FINDINGS: No acute fracture or dislocation. Humeral head in normal alignment with the glenoid. Degenerative osteophytic spurring present at the inferior aspect of the humeral head. AC joint is approximated. No periarticular calcification. Congestive changes noted within the partially visualized left lung. IMPRESSION: 1. No acute fracture or dislocation about the left shoulder. 2. Degenerative osteoarthrosis about the left glenohumeral joint. Electronically Signed   By: Jeannine Boga M.D.   On: 10/18/2016 22:04   Dg Shoulder Left  Result Date: 10/02/2016 CLINICAL DATA:  [place] today, bruising  and pain lateral LEFT shoulder EXAM: LEFT SHOULDER - 2+ VIEW COMPARISON:  None FINDINGS: Osseous demineralization. Mild LEFT glenohumeral degenerative changes with joint space narrowing and spur formation. AC joint alignment  normal. No acute fracture, dislocation, or bone destruction. Prior spinal augmentation procedure at the lower thoracic spine. Aortic atherosclerosis noted. Visualized LEFT ribs intact. IMPRESSION: Osseous demineralization with LEFT glenohumeral degenerative changes. No acute osseous abnormalities. Aortic atherosclerosis. Electronically Signed   By: Lavonia Dana M.D.   On: 10/02/2016 13:47   Dg Bone Survey Met  Result Date: 10/21/2016 CLINICAL DATA:  Multiple myeloma. EXAM: METASTATIC BONE SURVEY COMPARISON:  Radiographs of April 25, 2016. MRI of March 18, 2011. FINDINGS: There is interval development of mild compression deformity of L3 vertebral body concerning for fracture of indeterminate age. Status post kyphoplasty of T9 vertebral body. Interval development of moderate compression deformity involving T4 vertebral body concerning for pathologic fracture. Possible 2 small lucent lesions are seen in the skull posteriorly. IMPRESSION: Possible 2 small lucent lesions seen posteriorly in the skull which may represent multiple myeloma. Interval development of moderate compression deformity of T4 vertebral body concerning for possible pathologic fracture given history of multiple myeloma. Also noted is interval development of mild compression deformity of L3 vertebral body concerning for fracture of indeterminate age ; MRI may be performed further evaluation. Electronically Signed   By: Marijo Conception, M.D.   On: 10/21/2016 10:50   Dg Hips Bilat With Pelvis 3-4 Views  Result Date: 10/02/2016 CLINICAL DATA:  [place] today, BILATERAL hip pain, BILATERAL wrist pain EXAM: DG HIP (WITH OR WITHOUT PELVIS) 3-4V BILAT COMPARISON:  None FINDINGS: Osseous demineralization. Hip and SI joint spaces symmetric and preserved. Proximal RIGHT femur appears intact. Questionable abnormal appearance at the LEFT femoral head/neck junction, could be related to rotation of the leg but subtle fracture not completely excluded. No  additional fracture, dislocation or bone destruction. Degenerative disc and facet disease changes at visualized lower lumbar spine. Scattered atherosclerotic calcifications. IMPRESSION: Osseous demineralization. Proximal RIGHT femur appears intact but a subtle subcapital fracture of the LEFT femoral neck is not completely excluded ; if patient has persistent pain at this site recommend additional imaging. Electronically Signed   By: Lavonia Dana M.D.   On: 10/02/2016 13:54    Labs:  CBC:  Recent Labs  09/27/16 0652 10/18/16 1942 10/23/16 0519 10/23/16 2038 10/24/16 0537  WBC 7.0 8.7 8.6  --  9.4  HGB 8.0* 8.0* 6.5* 8.4* 7.9*  HCT 23.4* 23.1* 18.8* 25.1* 23.1*  PLT 154 173 195  --  192    COAGS:  Recent Labs  09/26/16 1307  INR 0.95    BMP:  Recent Labs  10/21/16 1226 10/22/16 0544 10/23/16 0519 10/24/16 0537  NA 144 142 137 137  K 2.9* 4.0 3.8 3.7  CL 110 111 106 104  CO2 25 25 24 25   GLUCOSE 210* 116* 102* 113*  BUN 25* 24* 23* 25*  CALCIUM 11.8* 11.5* 11.2* 11.7*  CREATININE 1.37* 1.26* 1.15* 1.07*  GFRNONAA 34* 38* 42* 46*  GFRAA 40* 44* 49* 54*    LIVER FUNCTION TESTS:  Recent Labs  09/25/16 0500 10/18/16 1942 10/23/16 0519  BILITOT 0.6 1.1 0.7  AST 17 24 14*  ALT 11* 10* 8*  ALKPHOS 87 150* 110  PROT 4.8* 6.3* 5.7*  ALBUMIN 2.7* 3.7 3.2*    TUMOR MARKERS: No results for input(s): AFPTM, CEA, CA199, CHROMGRNA in the last 8760 hours.  Assessment and Plan:  Multiple  Myeloma  Will plan for bone marrow biopsy tomorrow morning.  Risks and Benefits discussed with the patient including, but not limited to bleeding, infection, damage to adjacent structures or low yield requiring additional tests.  All of the patient's questions were answered, patient is agreeable to proceed. Consent signed and in chart.  Thank you for this interesting consult.  I greatly enjoyed meeting NAOMA BOXELL and look forward to participating in their care.  A copy of  this report was sent to the requesting provider on this date.  Electronically Signed: Murrell Redden PA-C 10/24/2016, 1:16 PM   I spent a total of 40 Minutes in face to face in clinical consultation, greater than 50% of which was counseling/coordinating care for bone marrow biopsy

## 2016-10-24 NOTE — Progress Notes (Signed)
Occupational Therapy Treatment Patient Details Name: Kristin [name] MRN: [id number] DOB: [birth year] Today's Date: 10/24/2016    History of present illness Kristin [name] is a 83 y.o. female with medical history significant of chronic back pain, hypertension, hyperlipidemia, vertigo, anemia, chronic kidney disease-stage III, who presents with intractable back pain--> UTI/sepsis; MRI-Lumbar showed multilevel degenerative disc disease, but no spinal cord compression  3/11 -pt has an order for a clavicle strap.  Clavicle strap is in    place.                       OT comments  OT issued yellow theraputty and educated pt regarding use.  Follow Up Recommendations  Supervision/Assistance - 24 hour;Home health OT    Equipment Recommendations   (family wants DC home)    Recommendations for Other Services      Precautions / Restrictions Precautions Precaution Comments: pt has a new clavicle strap ordered by Dr Erlinda Hong.         Mobility Bed Mobility Overal bed mobility: Needs Assistance Bed Mobility: Supine to Sit;Sit to Supine Rolling: Min assist   Supine to sit: Min assist;HOB elevated;Mod assist Sit to supine: Mod assist;Min assist      Transfers                 General transfer comment: declined OOB        ADL Overall ADL's : Needs assistance/impaired     Grooming: Supervision/safety;Sitting;Minimal assistance                                 General ADL Comments: OT issued yellow theraputty and educated pt in how to use. Pt practiced with both hands flattening putty as well as shaping in a ball.  Pt fatigued from swallow test but stated she would use some later.  Per pt L hand is weaker than R hand      Vision                            Cognition   Behavior During Therapy: WFL for tasks assessed/performed Overall Cognitive Status: Within Functional Limits for tasks assessed                                    Pertinent  Vitals/ Pain       Pain Score: 3  Faces Pain Scale: Hurts little more Pain Location: back Pain Descriptors / Indicators: Aching;Guarding;Discomfort;Grimacing Pain Intervention(s): Repositioned;Limited activity within patient's tolerance;Other (comment) (pt declined telling nurse that she needed something for pain)  [place] expects to be discharged to:: Private residence Living Arrangements: Alone Available Help at Discharge: Available 24 hours/day;Family Type of Home: House (condo) Home Access: Level entry     [place]: [employer];Bedside commode;Shower seat;Walker - 2 wheels          Prior Functioning/Environment Level of Independence: Independent with assistive device(s)        Comments: Per famil she has been using a rolling walker    Frequency  Min 2X/week        Progress Toward Goals  OT Goals(current goals can now be found in the care plan section)  Progress towards OT goals: Progressing toward goals  Acute Rehab OT Goals OT Goal Formulation: With patient ADL Goals Pt Will Perform Grooming: with supervision;with set-up;sitting;standing Pt Will Transfer to Toilet: regular height toilet;ambulating;bedside commode;with min assist Pt Will Perform Toileting - Clothing Manipulation and hygiene: with supervision;sit to/from stand Pt/caregiver will Perform Home Exercise Program: With written HEP provided (with theraputty for BUE)  Plan Discharge plan remains appropriate       End of Session    OT Visit Diagnosis: Unsteadiness on feet (R26.81);Pain;Muscle weakness (generalized) (M62.81)   Activity Tolerance Patient limited by fatigue   Patient Left in bed;with call bell/phone within reach;with family/visitor present   Nurse Communication Mobility status        Time: 1321-1330 OT Time Calculation (min): 9 min  Charges: OT General Charges $OT Visit: 1 Procedure OT  Evaluation $OT Eval Moderate Complexity: 1 Procedure OT Treatments $Therapeutic Exercise: 8-22 mins  [place], [place]   Kristin [name] 10/24/2016, 1:37 PM

## 2016-10-24 NOTE — Progress Notes (Signed)
Physical Therapy Treatment Patient Details Name: Kristin [name] MRN: [id number] DOB: 1931/04/25 Today's Date: 10/24/2016    History of Present Illness Kristin [name] is a 83 y.o. female with medical history significant of chronic back pain, hypertension, hyperlipidemia, vertigo, anemia, chronic kidney disease-stage III, who presents with intractable back pain--> UTI/sepsis; MRI-Lumbar showed multilevel degenerative disc disease, but no spinal cord compression  3/11 -pt has an order for a clavicle strap.  Clavicle strap is in    place.                        PT Comments    Assisted OOB to [facility], amb around room, then assisted back to bed per pt request.   Follow Up Recommendations  Home health PT;Supervision/Assistance - 24 hour     Equipment Recommendations  None recommended by PT    Recommendations for Other Services       Precautions / Restrictions Precautions Precautions: Fall Precaution Comments: pt has a new clavicle strap ordered by Dr Erlinda Hong.   Restrictions Weight Bearing Restrictions: No    Mobility  Bed Mobility Overal bed mobility: Needs Assistance Bed Mobility: Supine to Sit;Sit to Supine     Supine to sit: Min assist;HOB elevated;Mod assist     General bed mobility comments: increased, increased time with decreased functional use L UE (clavical fx).  Utilized bed pad to complete scooting to EOB and extra assist back onto bed esp B LE  Transfers Overall transfer level: Needs assistance Equipment used: None Transfers: Stand Pivot Transfers Sit to Stand: Min guard         General transfer comment: pt able to self rise from bed and complete 1/4 turn to [facility] which was placed on Pt's R.  Required Mod Assist to complete turn as pt tried to sit prior to turn completion.  50 % VC's on proper hand placement.   Ambulation/Gait Ambulation/Gait assistance: Min assist Ambulation Distance (Feet): 8 Feet Assistive device: 4-wheeled walker Gait Pattern/deviations:  Step-to pattern;Decreased step length - right;Decreased step length - left;Shuffle;Trunk flexed Gait velocity: decreased   General Gait Details: 50% assist to advance walker, pt was able to amb around room.  Slowly.  Poor forward flex posture.  Limited distance due to max c/o weakness/fatigue.    Stairs            Wheelchair Mobility    Modified Rankin (Stroke Patients Only)       Balance                                    Cognition Arousal/Alertness: Awake/alert Behavior During Therapy: WFL for tasks assessed/performed Overall Cognitive Status: Within Functional Limits for tasks assessed                 General Comments: slow moving requiring MAX encouragement to increase self participation    Exercises      General Comments        Pertinent Vitals/Pain Pain Assessment: Faces Pain Score: 3  Faces Pain Scale: Hurts a little bit Pain Location: back Pain Descriptors / Indicators: Aching;Guarding;Discomfort;Grimacing Pain Intervention(s): Monitored during session;Repositioned    Home Living                      Prior Function            PT Goals (current goals can now be found  in the care plan section) Progress towards PT goals: Progressing toward goals    Frequency    Min 3X/week      PT Plan Current plan remains appropriate    Co-evaluation             End of Session Equipment Utilized During Treatment: Gait belt Activity Tolerance: Patient tolerated treatment well Patient left: in bed;with call bell/phone within reach;with bed alarm set Nurse Communication:  (used BSC) PT Visit Diagnosis: Other abnormalities of gait and mobility (R26.89)     Time: 2561-5488 PT Time Calculation (min) (ACUTE ONLY): 27 min  Charges:  $Gait Training: 8-22 mins $Therapeutic Activity: 8-22 mins                    G Codes:  Functional Limitation: Mobility: Walking and moving around    [employer]  PTA [employer]  Acute   Rehab Pager      [phone]

## 2016-10-24 NOTE — Progress Notes (Signed)
Pt active with [facility].  Referral called to in house rep.

## 2016-10-24 NOTE — Evaluation (Signed)
Occupational Therapy Evaluation Patient Details Name: Kristin [name] MRN: [id number] DOB: Mar 26, 1931 Today's Date: 10/24/2016    History of Present Illness Kristin [name] is a 83 y.o. female with medical history significant of chronic back pain, hypertension, hyperlipidemia, vertigo, anemia, chronic kidney disease-stage III, who presents with intractable back pain--> UTI/sepsis; MRI-Lumbar showed multilevel degenerative disc disease, but no spinal cord compression  3/11 -pt has an order for a clavicle strap.  Clavicle strap is in   place.                       Clinical Impression   Pt admitted with back pain. Pt currently with functional limitations due to the deficits listed below (see OT Problem List).  Pt will benefit from skilled OT to increase their safety and independence with ADL and functional mobility for ADL to facilitate discharge to venue listed below.      Follow Up Recommendations  Supervision/Assistance - 24 hour;Home health OT    Equipment Recommendations   (family wants DC home)       Precautions / Restrictions Precautions Precaution Comments: pt has a new clavicle strap ordered by Dr Erlinda Hong.        Mobility Bed Mobility Overal bed mobility: Needs Assistance Bed Mobility: Supine to Sit;Sit to Supine Rolling: Min assist   Supine to sit: Min assist;HOB elevated;Mod assist Sit to supine: Mod assist;Min assist      Transfers                 General transfer comment: declined OOB         ADL Overall ADL's : Needs assistance/impaired     Grooming: Supervision/safety;Sitting;Minimal assistance                                 General ADL Comments: pt agreed to sit EOB with OT but declined OOB due to going for barium swallow     Vision Patient Visual Report: No change from baseline              Pertinent Vitals/Pain Faces Pain Scale: Hurts little more Pain Location: back Pain Descriptors / Indicators:  Aching;Guarding;Discomfort;Grimacing Pain Intervention(s): Repositioned;Limited activity within patient's tolerance;Other (comment) (pt declined telling nurse that she needed something for pain)     Hand Dominance     Extremity/Trunk Assessment Upper Extremity Assessment Upper Extremity Assessment: Generalized weakness (pt using BUE as needed for moving in bed- but will clarify if any precautions due to  new clavicle brace)           Communication Communication Communication: No difficulties   Cognition Arousal/Alertness: Awake/alert Behavior During Therapy: WFL for tasks assessed/performed Overall Cognitive Status: Within Functional Limits for tasks assessed                                Home Living Family/patient expects to be discharged to:: Private residence Living Arrangements: Alone Available Help at Discharge: Available 24 hours/day;Family Type of Home: House (condo) Home Access: Level entry     Home Layout: One level               Home Equipment: Grab bars - tub/shower;Bedside commode;Shower seat;Walker - 2 wheels          Prior Functioning/Environment Level of Independence: Independent with assistive device(s)        Comments: Per  famil she has been using a rolling walker         OT Problem List: Decreased strength;Decreased activity tolerance;Pain      OT Treatment/Interventions: Self-care/ADL training;Therapeutic activities;Patient/family education;DME and/or AE instruction;Balance training    OT Goals(Current goals can be found in the care plan section) Acute Rehab OT Goals OT Goal Formulation: With patient  OT Frequency: Min 2X/week   Barriers to D/C:               End of Session Nurse Communication: Mobility status  Activity Tolerance: Patient limited by pain Patient left: in bed;with call bell/phone within reach;with family/visitor present  OT Visit Diagnosis: Unsteadiness on feet (R26.81);Pain;Muscle weakness  (generalized) (M62.81)                ADL either performed or assessed with clinical judgement  Time: 1120-1135 OT Time Calculation (min): 15 min Charges:  OT General Charges $OT Visit: 1 Procedure OT Evaluation $OT Eval Moderate Complexity: 1 Procedure G-Codes:     Kari Baars, OT [phone]  Payton Mccallum D 10/24/2016, 11:51 AM

## 2016-10-24 NOTE — Consult Note (Signed)
SLP Cancellation Note  Patient Details Name: ELEONOR OCON MRN: [id number] DOB: February 07, 1931   Cancelled treatment:       Reason Eval/Treat Not Completed:  (Pt currently NPO for Regular Barium Swallow.) Unable to assess diet tolerance at this time. Will follow up after results of barium swallow are available. RN aware.  Celia B. Quentin Ore [facility], [place]  Shonna Chock 10/24/2016, 10:26 AM

## 2016-10-24 NOTE — Plan of Care (Signed)
Problem: Pain Managment: Goal: General experience of comfort will improve Outcome: Progressing Pt denies pain at this time. Pain to left clavicle with movement.   Analgesic offered and pt refused. Will continue to monitor.l

## 2016-10-25 ENCOUNTER — Inpatient Hospital Stay (HOSPITAL_COMMUNITY): Payer: Medicare Other

## 2016-10-25 ENCOUNTER — Encounter (HOSPITAL_COMMUNITY): Payer: Self-pay | Admitting: Radiology

## 2016-10-25 DIAGNOSIS — I10 Essential (primary) hypertension: Secondary | ICD-10-CM

## 2016-10-25 LAB — BASIC METABOLIC PANEL
Anion gap: 5 (ref 5–15)
BUN: 18 mg/dL (ref 6–20)
CALCIUM: 12.6 mg/dL — AB (ref 8.9–10.3)
CO2: 24 mmol/L (ref 22–32)
CREATININE: 1.04 mg/dL — AB (ref 0.44–1.00)
Chloride: 112 mmol/L — ABNORMAL HIGH (ref 101–111)
GFR calc non Af Amer: 48 mL/min — ABNORMAL LOW (ref >60)
GFR, EST AFRICAN AMERICAN: 56 mL/min — AB (ref >60)
Glucose, Bld: 109 mg/dL — ABNORMAL HIGH (ref 65–99)
Potassium: 3.5 mmol/L (ref 3.5–5.1)
SODIUM: 141 mmol/L (ref 135–145)

## 2016-10-25 LAB — CBC
HEMATOCRIT: 23.1 % — AB (ref 36.0–46.0)
HEMATOCRIT: 23.9 % — AB (ref 36.0–46.0)
HEMOGLOBIN: 7.9 g/dL — AB (ref 12.0–15.0)
Hemoglobin: 8 g/dL — ABNORMAL LOW (ref 12.0–15.0)
MCH: 31.5 pg (ref 26.0–34.0)
MCH: 31.4 pg (ref 26.0–34.0)
MCHC: 34.2 g/dL (ref 30.0–36.0)
MCHC: 33.5 g/dL (ref 30.0–36.0)
MCV: 92 fL (ref 78.0–100.0)
MCV: 93.7 fL (ref 78.0–100.0)
Platelets: 192 10*3/uL (ref 150–400)
Platelets: 197 10*3/uL (ref 150–400)
RBC: 2.51 MIL/uL — ABNORMAL LOW (ref 3.87–5.11)
RBC: 2.55 MIL/uL — ABNORMAL LOW (ref 3.87–5.11)
RDW: 20.3 % — AB (ref 11.5–15.5)
RDW: 19.6 % — AB (ref 11.5–15.5)
WBC: 9.4 10*3/uL (ref 4.0–10.5)
WBC: 9.4 10*3/uL (ref 4.0–10.5)

## 2016-10-25 LAB — GLUCOSE, CAPILLARY: Glucose-Capillary: 104 mg/dL — ABNORMAL HIGH (ref 65–99)

## 2016-10-25 LAB — HEMOGLOBIN AND HEMATOCRIT, BLOOD
HCT: 25.1 % — ABNORMAL LOW (ref 36.0–46.0)
Hemoglobin: 8.4 g/dL — ABNORMAL LOW (ref 12.0–15.0)

## 2016-10-25 MED ORDER — FENTANYL CITRATE (PF) 100 MCG/2ML IJ SOLN
INTRAMUSCULAR | Status: AC | PRN
  Administered 2016-10-25 (×2): 12.5 ug via INTRAVENOUS

## 2016-10-25 MED ORDER — FLUMAZENIL 0.5 MG/5ML IV SOLN
INTRAVENOUS | Status: AC
  Filled 2016-10-25: qty 5

## 2016-10-25 MED ORDER — MIDAZOLAM HCL 2 MG/2ML IJ SOLN
INTRAMUSCULAR | Status: AC
  Filled 2016-10-25: qty 4

## 2016-10-25 MED ORDER — SODIUM CHLORIDE 0.9 % IV SOLN
INTRAVENOUS | Status: AC
  Administered 2016-10-25 – 2016-10-26 (×2): via INTRAVENOUS

## 2016-10-25 MED ORDER — IBANDRONATE SODIUM 150 MG PO TABS: 150.0000 mg | ORAL_TABLET | ORAL | 0 refills | Status: DC

## 2016-10-25 MED ORDER — FENTANYL CITRATE (PF) 100 MCG/2ML IJ SOLN
INTRAMUSCULAR | Status: AC
  Filled 2016-10-25: qty 4

## 2016-10-25 MED ORDER — ENSURE ENLIVE PO LIQD: 237.0000 mL | Freq: Two times a day (BID) | ORAL | 12 refills | Status: AC

## 2016-10-25 MED ORDER — POTASSIUM CHLORIDE 20 MEQ/15ML (10%) PO SOLN
40.0000 meq | Freq: Once | ORAL | Status: AC
Start: 2016-10-25 — End: 2016-10-25
  Administered 2016-10-25: 40 meq via ORAL
  Filled 2016-10-25: qty 30

## 2016-10-25 MED ORDER — NALOXONE HCL 0.4 MG/ML IJ SOLN
INTRAMUSCULAR | Status: AC
  Filled 2016-10-25: qty 1

## 2016-10-25 MED ORDER — MIDAZOLAM HCL 2 MG/2ML IJ SOLN
INTRAMUSCULAR | Status: AC | PRN
  Administered 2016-10-25: 0.5 mg via INTRAVENOUS

## 2016-10-25 NOTE — Progress Notes (Signed)
PROGRESS NOTE  Kristin [name] XIP:[id number] DOB: [birth year] DOA: 10/18/2016 PCP: [name], MD  HPI/Recap of past 24 hours:  In bed, c/o left shoulder pain Less confused, she is aaox3 since 3/11 Family in room  Assessment/Plan: Principal Problem:   Acute encephalopathy Active Problems:   Acute renal failure superimposed on stage 3 chronic kidney disease (HCC)   HLD (hyperlipidemia)   HTN (hypertension)   Vertigo   Anemia   Back pain   Hypercalcemia  Hypercalcemia:  ca > 15 on admission PTH wnl, PTH related peptide unremakable, vitamin D level high normal, urine calcium 17.8, upep with elevated free lambda light chain, spep with small mspike,  Serum Kappa/lambda light chair pending, skeletal survey result noted, oncology consulted,  bone marrow biopsy on 3/13, biopsy result pending She received calcitonin for total of three days, last on 3/10, ca  nadir at 11.2 on 3/11, ca trending up,  pamidronate 13m x1 iv on 3/12 Gentle hydration for another 24hrs, she is instructed to restart on boniva a month from now. Repeat ca in am, patient to follow up with pmd on Thursday to repeat calcium level Possible d/c on 3/14 with oncology clearance   Metabolic encephalopathy:  Likely from hypercalcemia, ua no infection Recent ct head a two weeks ago , no acute findings, consider mri brain If mental status dose not improve with correcting calcium. Mental status improving, aaox3 since 3/11  N/V, unclear etiology, speech eval, no significant swallow problem,  family report patient started to vomit a few minutes after eating, she seems to have trouble with solid, no problem with meds or liquid  kub unremarkable,  dg esophagus  Review esophagus dysmotility, Hiatal hernia with potential gastric diverticulum, no high grade obstruction. Patient is encouraged to have smaller meals each time and multiple time a day, she needs to sit up eating  Hypokalemia/hypomagnesemia: replace  k/mag  AKI on CKDIII, likely from dehydration, cr improved with hydration, ivf held due to pleural effusion,  spep with small m spike, upep with elevated light chain  Anemia, macrocytic,  recent b12/folate/iron study wnl , tsh 1.7,  fobt negative, Spep/upep as above, bone marrow biopsy result pending hgb dropped to 6.5, prbc transfusion x1 on 3/11 Hematology/oncology input appreciated  HLD:  -zetia stopped, ldl 100 in 2015 Repeat lipid in am  HTN: -oral Bp meds hold initially due to confusion, IV hydralazine when necessary - mental status improved, back on oral meds, resume norvasc,  -D/c lisinopril/hctz due to renal impariment   H/o Vertigo: -prn  [place]  T2 and T4 vertebral compression fracture:  I have discussed with neurosurgery Dr CSaintclair Halstedwho recommended conservative management with back braces if needed. Currently patient denies pain  Healing nondisplaced fracture of the left clavicular head with severe surrounding callus formation and soft tissue swelling likely accounting for the palpable abnormality. Supportive measures, back strap and shoulder sling  FTT, family states can provide 24/7 care  Code Status: full  Family Communication: patient and daughters at bedside  Disposition Plan: home with home health likely on 3/14 with oncology clearance,  patient has 24/7 care at home, family donot want snf.   Consultants:  Oncology  Neurosurgery phone conversation with Dr CSaintclair Halsted Procedures:  Bone marrow biopsy on 3/13  Antibiotics:  none   Objective: BP 130/70 (BP Location: Right Arm)   Pulse 92   Temp 99 F (37.2 C) (Oral)   Resp 18   Ht 5' (1.524 m)   Wt 63.1 kg (  139 lb 1.8 oz)   SpO2 97%   BMI 27.17 kg/m   Intake/Output Summary (Last 24 hours) at 10-27-2016 1851 Last data filed at 27-Oct-2016 1400  Gross per 24 hour  Intake           1682.5 ml  Output              300 ml  Net           1382.5 ml   Filed Weights   10/18/16 2251  Weight: 63.1 kg  (139 lb 1.8 oz)    Exam:   General:  Very Frail, lethargic, less confused, but NAD  Cardiovascular: RRR  Respiratory: CTABL  Abdomen: Soft/ND/NT, positive BS  Musculoskeletal: No Edema  Neuro: less confused, following commands  Data Reviewed: Basic Metabolic Panel:  Recent Labs Lab 10/21/16 1226 10/22/16 0544 10/23/16 0519 10/24/16 0537 10-27-16 0622  NA 144 142 137 137 141  K 2.9* 4.0 3.8 3.7 3.5  CL 110 111 106 104 112*  CO2 25 25 24 25 24   GLUCOSE 210* 116* 102* 113* 109*  BUN 25* 24* 23* 25* 18  CREATININE 1.37* 1.26* 1.15* 1.07* 1.04*  CALCIUM 11.8* 11.5* 11.2* 11.7* 12.6*  MG  --  1.2* 1.6* 2.0  --    Liver Function Tests:  Recent Labs Lab 10/18/16 1942 10/23/16 0519  AST 24 14*  ALT 10* 8*  ALKPHOS 150* 110  BILITOT 1.1 0.7  PROT 6.3* 5.7*  ALBUMIN 3.7 3.2*   No results for input(s): LIPASE, AMYLASE in the last 168 hours. No results for input(s): AMMONIA in the last 168 hours. CBC:  Recent Labs Lab 10/18/16 1942 10/23/16 0519 10/23/16 2038 10/24/16 0537 10-27-2016 0622  WBC 8.7 8.6  --  9.4 9.4  NEUTROABS 4.5 3.0  --   --   --   HGB 8.0* 6.5* 8.4* 7.9* 8.0*  HCT 23.1* 18.8* 25.1* 23.1* 23.9*  MCV 103.1* 101.6*  --  92.0 93.7  PLT 173 195  --  192 197   Cardiac Enzymes:   No results for input(s): CKTOTAL, CKMB, CKMBINDEX, TROPONINI in the last 168 hours. BNP (last 3 results) No results for input(s): BNP in the last 8760 hours.  ProBNP (last 3 results) No results for input(s): PROBNP in the last 8760 hours.  CBG:  Recent Labs Lab 10/21/16 0727 10/22/16 0755 10/23/16 0813 10/24/16 0752 10-27-16 0802  GLUCAP 125* 117* 111* 125* 104*    Recent Results (from the past 240 hour(s))  Urine culture     Status: Abnormal   Collection Time: 10/19/16  7:55 AM  Result Value Ref Range Status   Specimen Description URINE, CLEAN CATCH  Final   Special Requests NONE  Final   Culture MULTIPLE SPECIES PRESENT, SUGGEST RECOLLECTION (A)   Final   Report Status 10/20/2016 FINAL  Final     Studies: Ct Biopsy  Result Date: 10-27-16 INDICATION: Hypercalcemia and monoclonal gammopathy of uncertain significance. EXAM: CT GUIDED BONE MARROW ASPIRATION AND CORE BIOPSY Interventional Radiologist:  Criselda Peaches, MD MEDICATIONS: None. ANESTHESIA/SEDATION: Moderate (conscious) sedation was employed during this procedure. A total of 1 milligrams versed and 50 micrograms fentanyl were administered intravenously. The patient's level of consciousness and vital signs were monitored continuously by radiology nursing throughout the procedure under my direct supervision. Total monitored sedation time: 13 minutes FLUOROSCOPY TIME:  Fluoroscopy Time: 0 minutes 0 seconds (0 mGy). COMPLICATIONS: None immediate. Estimated blood loss: <25 mL PROCEDURE: Informed written consent was obtained from  the patient after a thorough discussion of the procedural risks, benefits and alternatives. All questions were addressed. Maximal Sterile Barrier Technique was utilized including caps, mask, sterile gowns, sterile gloves, sterile drape, hand hygiene and skin antiseptic. A timeout was performed prior to the initiation of the procedure. The patient was positioned prone and non-contrast localization CT was performed of the pelvis to demonstrate the iliac marrow spaces. Maximal barrier sterile technique utilized including caps, mask, sterile gowns, sterile gloves, large sterile drape, hand hygiene, and betadine prep. Under sterile conditions and local anesthesia, an 11 gauge coaxial bone biopsy needle was advanced into the left iliac marrow space. Needle position was confirmed with CT imaging. Initially, bone marrow aspiration was performed. Next, the 11 gauge outer cannula was utilized to obtain a left iliac bone marrow core biopsy. Needle was removed. Hemostasis was obtained with compression. The patient tolerated the procedure well. Samples were prepared with the  cytotechnologist. IMPRESSION: Technically successful CT-guided bone marrow biopsy of the left iliac bone. Signed, Criselda Peaches, MD [facility] Rad[facility] Electronically Signed   By: Jacqulynn Cadet M.D.   On: 10/25/2016 10:53   Ct Bone Marrow Biopsy & Aspiration  Result Date: 10/25/2016 INDICATION: Hypercalcemia and monoclonal gammopathy of uncertain significance. EXAM: CT GUIDED BONE MARROW ASPIRATION AND CORE BIOPSY Interventional Radiologist:  Criselda Peaches, MD MEDICATIONS: None. ANESTHESIA/SEDATION: Moderate (conscious) sedation was employed during this procedure. A total of 1 milligrams versed and 50 micrograms fentanyl were administered intravenously. The patient's level of consciousness and vital signs were monitored continuously by radiology nursing throughout the procedure under my direct supervision. Total monitored sedation time: 13 minutes FLUOROSCOPY TIME:  Fluoroscopy Time: 0 minutes 0 seconds (0 mGy). COMPLICATIONS: None immediate. Estimated blood loss: <25 mL PROCEDURE: Informed written consent was obtained from the patient after a thorough discussion of the procedural risks, benefits and alternatives. All questions were addressed. Maximal Sterile Barrier Technique was utilized including caps, mask, sterile gowns, sterile gloves, sterile drape, hand hygiene and skin antiseptic. A timeout was performed prior to the initiation of the procedure. The patient was positioned prone and non-contrast localization CT was performed of the pelvis to demonstrate the iliac marrow spaces. Maximal barrier sterile technique utilized including caps, mask, sterile gowns, sterile gloves, large sterile drape, hand hygiene, and betadine prep. Under sterile conditions and local anesthesia, an 11 gauge coaxial bone biopsy needle was advanced into the left iliac marrow space. Needle position was confirmed with CT imaging. Initially, bone marrow aspiration was  performed. Next, the 11 gauge outer cannula was utilized to obtain a left iliac bone marrow core biopsy. Needle was removed. Hemostasis was obtained with compression. The patient tolerated the procedure well. Samples were prepared with the cytotechnologist. IMPRESSION: Technically successful CT-guided bone marrow biopsy of the left iliac bone. Signed, Criselda Peaches, MD [facility] Radiology Electronically Signed   By: Jacqulynn Cadet M.D.   On: 10/25/2016 10:53    Scheduled Meds: . sodium chloride   Intravenous Once  . amLODipine  5 mg Oral Daily  . cycloSPORINE  1 drop Both Eyes Daily  . feeding supplement (ENSURE ENLIVE)  237 mL Oral BID BM  . fentaNYL      . midazolam      . sodium chloride flush  3 mL Intravenous Q12H    Continuous Infusions: . sodium chloride       Time spent: 7mns  Carl Butner MD, PhD  [facility] Pager [phone]  If 7PM-7AM, please contact night-coverage at [email or web address], password [facility] 10/25/2016, 6:51 PM  LOS: 7 days

## 2016-10-25 NOTE — Progress Notes (Signed)
Occupational Therapy Treatment Patient Details Name: Kristin [name] MRN: [id number] DOB: 1931-01-25 Today's Date: 10/25/2016    History of present illness Kristin [name] is a 83 y.o. female with medical history significant of chronic back pain, hypertension, hyperlipidemia, vertigo, anemia, chronic kidney disease-stage III, who presents with intractable back pain--> UTI/sepsis; MRI-Lumbar showed multilevel degenerative disc disease, but no spinal cord compression  3/11 -pt has an order for a clavicle strap.  3/13 spoke with MD regarding clavicle strap . Md wrote order for sling for LUE.  OT called ortho tech this day regarding order.                  OT comments  Explained to pt and daughter sling would be delivered via ortho tech. Explained use of sling and that MD did not want pt pushing up with LUE.     Follow Up Recommendations  Supervision/Assistance - 24 hour;Home health OT          Precautions / Restrictions Precautions Precautions: Fall Precaution Comments: pt now has order for LUE sling per Dr Erlinda Hong for LUE.  MD doesnt want pt pushing with LUE and pt was doing this with clavicle strap.       Mobility Bed Mobility Overal bed mobility: Needs Assistance Bed Mobility: Supine to Sit;Sit to Supine Rolling: Min assist Sidelying to sit: Min assist;HOB elevated Supine to sit: Min assist;HOB elevated Sit to supine: HOB elevated;Min assist      Transfers Overall transfer level: Needs assistance Equipment used: Rolling walker (2 wheeled) Transfers: Sit to/from [facility] Sit to Stand: Min assist Stand pivot transfers: Min assist                ADL Overall ADL's : Needs assistance/impaired     Grooming: Supervision/safety;Sitting;Minimal assistance                   Toilet Transfer: Minimal assistance;RW;Cueing for sequencing;BSC;Cueing for safety   Toileting- Clothing Manipulation and Hygiene: Maximal assistance;Sit to/from stand;Cueing for  safety;Cueing for sequencing;Cueing for compensatory techniques                          Cognition   Behavior During Therapy: [facility] for tasks assessed/performed Overall Cognitive Status: Within Functional Limits for tasks assessed                  General Comments: slow moving requiring MAX encouragement to increase self participation                 Pertinent Vitals/ Pain       Pain Assessment: 0-10 Pain Score: 3  Pain Location: back Pain Descriptors / Indicators: Aching;Guarding;Discomfort;Grimacing Pain Intervention(s): Monitored during session;Repositioned            Progress Toward Goals  OT Goals(current goals can now be found in the care plan section)  Progress towards OT goals: Progressing toward goals     Plan Discharge plan remains appropriate - daughters a private caregivers       End of Session Equipment Utilized During Treatment: Rolling walker  OT Visit Diagnosis: Unsteadiness on feet (R26.81);Pain;Muscle weakness (generalized) (M62.81)   Activity Tolerance Patient tolerated treatment well   Patient Left in bed;with call bell/phone within reach;with family/visitor present   Nurse Communication Mobility status        Time: 3419-3790 OT Time Calculation (min): 33 min  Charges: OT General Charges $OT Visit: 1 Procedure OT Treatments $Self Care/Home  Management : 23-37 mins  Kari Baars, [place]   Betsy Pries 10/25/2016, 2:58 PM

## 2016-10-25 NOTE — Progress Notes (Signed)
OT Cancellation Note  Patient Details Name: Kristin [name] MRN: [id number] DOB: Feb 18, 1931   Cancelled Treatment:    Reason Eval/Treat Not Completed: Medical issues which prohibited therapy  Pt had procedure and now on bedrest for an hour.  Will check on pt later in day or next day Kari Baars, [place]  Payton Mccallum D 10/25/2016, 10:22 AM

## 2016-10-25 NOTE — Progress Notes (Signed)
Kristin [name]   DOB:24-Jun-1931   QD#:[id number]   NMM#:[id number]  Hematology and oncology follow-up note  Subjective: Patient overall feels better. She underwent a bone marrow biopsy this morning and is tolerated the procedure well. Her calcium has improved, no other new complaints.   Objective:  Vitals:   10/25/16 1430 10/25/16 2052  BP: 130/70 (!) 142/61  Pulse: 92 62  Resp: 18 18  Temp: 99 F (37.2 C) 98.8 F (37.1 C)    Body mass index is 27.17 kg/m.  Intake/Output Summary (Last 24 hours) at 10/25/16 2220 Last data filed at 10/25/16 1900  Gross per 24 hour  Intake             1665 ml  Output                0 ml  Net             1665 ml     Sclerae unicteric  Oropharynx clear  No peripheral adenopathy  Lungs clear -- no rales or rhonchi  Heart regular rate and rhythm  Abdomen benign  MSK no focal spinal tenderness, no peripheral edema  Neuro nonfocal    CBG (last 3)   Recent Labs  10/23/16 0813 10/24/16 0752 10/25/16 0802  GLUCAP 111* 125* 104*     Labs:  Lab Results  Component Value Date   WBC 9.4 10/25/2016   HGB 8.0 (L) 10/25/2016   HCT 23.9 (L) 10/25/2016   MCV 93.7 10/25/2016   PLT 197 10/25/2016   NEUTROABS 3.0 10/23/2016   CMP Latest Ref Rng & Units 10/25/2016 10/24/2016 10/23/2016  Glucose 65 - 99 mg/dL 109(H) 113(H) 102(H)  BUN 6 - 20 mg/dL 18 25(H) 23(H)  Creatinine 0.44 - 1.00 mg/dL 1.04(H) 1.07(H) 1.15(H)  Sodium 135 - 145 mmol/L 141 137 137  Potassium 3.5 - 5.1 mmol/L 3.5 3.7 3.8  Chloride 101 - 111 mmol/L 112(H) 104 106  CO2 22 - 32 mmol/L _0 Calcium 8.9 - 10.3 mg/dL 12.6(H) 11.7(H) 11.2(H)  Total Protein 6.5 - 8.1 g/dL - - 5.7(L)  Total Bilirubin 0.3 - 1.2 mg/dL - - 0.7  Alkaline Phos 38 - 126 U/L - - 110  AST 15 - 41 U/L - - 14(L)  ALT 14 - 54 U/L - - 8(L)     Urine Studies No results for input(s): UHGB, CRYS in the last 72 hours.  Invalid input(s): UACOL, UAPR, USPG, UPH, UTP, UGL, UKET, UBIL, UNIT, UROB, [place],  UEPI, UWBC, Duwayne Heck [place], [place]  Basic Metabolic Panel:  Recent Labs Lab 10/21/16 1226 10/22/16 0544 10/23/16 0519 10/24/16 0537 10/25/16 0622  NA 144 142 137 137 141  K 2.9* 4.0 3.8 3.7 3.5  CL 110 111 106 104 112*  CO2 _1 GLUCOSE 210* 116* 102* 113* 109*  BUN 25* 24* 23* 25* 18  CREATININE 1.37* 1.26* 1.15* 1.07* 1.04*  CALCIUM 11.8* 11.5* 11.2* 11.7* 12.6*  MG  --  1.2* 1.6* 2.0  --    GFR Estimated Creatinine Clearance: 33.4 mL/min (by C-G formula based on SCr of 1.04 mg/dL (H)). Liver Function Tests:  Recent Labs Lab 10/23/16 0519  AST 14*  ALT 8*  ALKPHOS 110  BILITOT 0.7  PROT 5.7*  ALBUMIN 3.2*   No results for input(s): LIPASE, AMYLASE in the last 168 hours. No results for input(s): AMMONIA in the last 168 hours. Coagulation profile No results for input(s): INR, PROTIME in  the last 168 hours.  CBC:  Recent Labs Lab 10/23/16 0519 10/23/16 2038 10/24/16 0537 10/25/16 0622  WBC 8.6  --  9.4 9.4  NEUTROABS 3.0  --   --   --   HGB 6.5* 8.4* 7.9* 8.0*  HCT 18.8* 25.1* 23.1* 23.9*  MCV 101.6*  --  92.0 93.7  PLT 195  --  192 197   Cardiac Enzymes: No results for input(s): CKTOTAL, CKMB, CKMBINDEX, TROPONINI in the last 168 hours. BNP: Invalid input(s): POCBNP CBG:  Recent Labs Lab 10/21/16 0727 10/22/16 0755 10/23/16 0813 10/24/16 0752 10/25/16 0802  GLUCAP 125* 117* 111* 125* 104*   D-Dimer No results for input(s): DDIMER in the last 72 hours. Hgb A1c No results for input(s): HGBA1C in the last 72 hours. Lipid Profile No results for input(s): CHOL, HDL, LDLCALC, TRIG, CHOLHDL, LDLDIRECT in the last 72 hours. Thyroid function studies No results for input(s): TSH, T4TOTAL, T3FREE, THYROIDAB in the last 72 hours.  Invalid input(s): FREET3 Anemia work up No results for input(s): VITAMINB12, FOLATE, FERRITIN, TIBC, IRON, RETICCTPCT in the last 72 hours. Microbiology Recent Results (from the past 240 hour(s))   Urine culture     Status: Abnormal   Collection Time: 10/19/16  7:55 AM  Result Value Ref Range Status   Specimen Description URINE, CLEAN CATCH  Final   Special Requests NONE  Final   Culture MULTIPLE SPECIES PRESENT, SUGGEST RECOLLECTION (A)  Final   Report Status 10/20/2016 FINAL  Final      Studies:  Dg Esophagus  Result Date: 10/24/2016 CLINICAL DATA:  Difficulty swallowing pre nausea vomiting EXAM: ESOPHOGRAM/BARIUM SWALLOW TECHNIQUE: Single contrast examination was performed using  thin. FLUOROSCOPY TIME:  Fluoroscopy Time:  2 minutes 30 seconds Radiation Exposure Index (if provided by the fluoroscopic device): 25.0 mGy Number of Acquired Spot Images: 10 COMPARISON:  CT 06/20/2016, 10/19/2016 FINDINGS: Limited exam due to patient immobility Esophagitis is mildly patulous. Contrast flows through the esophagus and GE junction without high-grade obstruction. There is poor esophageal motility with tertiary contractions. Hiatal hernia noted. 13 mm tablet was administered. Barium tablet traveled into the hiatal hernia sac. Potential gastric diverticulum retains barium tablet. IMPRESSION: 1. Esophageal dysmotility. 2. Hiatal hernia with potential gastric diverticulum. Hiatal hernia versus diverticulum retains the barium tablet. 3. No high-grade obstruction. Electronically Signed   By: Suzy Bouchard M.D.   On: 10/24/2016 13:08   Ct Biopsy  Result Date: 10/25/2016 INDICATION: Hypercalcemia and monoclonal gammopathy of uncertain significance. EXAM: CT GUIDED BONE MARROW ASPIRATION AND CORE BIOPSY Interventional Radiologist:  Criselda Peaches, MD MEDICATIONS: None. ANESTHESIA/SEDATION: Moderate (conscious) sedation was employed during this procedure. A total of 1 milligrams versed and 50 micrograms fentanyl were administered intravenously. The patient's level of consciousness and vital signs were monitored continuously by radiology nursing throughout the procedure under my direct supervision.  Total monitored sedation time: 13 minutes FLUOROSCOPY TIME:  Fluoroscopy Time: 0 minutes 0 seconds (0 mGy). COMPLICATIONS: None immediate. Estimated blood loss: <25 mL PROCEDURE: Informed written consent was obtained from the patient after a thorough discussion of the procedural risks, benefits and alternatives. All questions were addressed. Maximal Sterile Barrier Technique was utilized including caps, mask, sterile gowns, sterile gloves, sterile drape, hand hygiene and skin antiseptic. A timeout was performed prior to the initiation of the procedure. The patient was positioned prone and non-contrast localization CT was performed of the pelvis to demonstrate the iliac marrow spaces. Maximal barrier sterile technique utilized including caps, mask, sterile gowns, sterile gloves,  large sterile drape, hand hygiene, and betadine prep. Under sterile conditions and local anesthesia, an 11 gauge coaxial bone biopsy needle was advanced into the left iliac marrow space. Needle position was confirmed with CT imaging. Initially, bone marrow aspiration was performed. Next, the 11 gauge outer cannula was utilized to obtain a left iliac bone marrow core biopsy. Needle was removed. Hemostasis was obtained with compression. The patient tolerated the procedure well. Samples were prepared with the cytotechnologist. IMPRESSION: Technically successful CT-guided bone marrow biopsy of the left iliac bone. Signed, Criselda Peaches, MD [facility] Radiology Electronically Signed   By: Jacqulynn Cadet M.D.   On: 10/25/2016 10:53   Ct Bone Marrow Biopsy & Aspiration  Result Date: 10/25/2016 INDICATION: Hypercalcemia and monoclonal gammopathy of uncertain significance. EXAM: CT GUIDED BONE MARROW ASPIRATION AND CORE BIOPSY Interventional Radiologist:  Criselda Peaches, MD MEDICATIONS: None. ANESTHESIA/SEDATION: Moderate (conscious) sedation was employed during this procedure. A  total of 1 milligrams versed and 50 micrograms fentanyl were administered intravenously. The patient's level of consciousness and vital signs were monitored continuously by radiology nursing throughout the procedure under my direct supervision. Total monitored sedation time: 13 minutes FLUOROSCOPY TIME:  Fluoroscopy Time: 0 minutes 0 seconds (0 mGy). COMPLICATIONS: None immediate. Estimated blood loss: <25 mL PROCEDURE: Informed written consent was obtained from the patient after a thorough discussion of the procedural risks, benefits and alternatives. All questions were addressed. Maximal Sterile Barrier Technique was utilized including caps, mask, sterile gowns, sterile gloves, sterile drape, hand hygiene and skin antiseptic. A timeout was performed prior to the initiation of the procedure. The patient was positioned prone and non-contrast localization CT was performed of the pelvis to demonstrate the iliac marrow spaces. Maximal barrier sterile technique utilized including caps, mask, sterile gowns, sterile gloves, large sterile drape, hand hygiene, and betadine prep. Under sterile conditions and local anesthesia, an 11 gauge coaxial bone biopsy needle was advanced into the left iliac marrow space. Needle position was confirmed with CT imaging. Initially, bone marrow aspiration was performed. Next, the 11 gauge outer cannula was utilized to obtain a left iliac bone marrow core biopsy. Needle was removed. Hemostasis was obtained with compression. The patient tolerated the procedure well. Samples were prepared with the cytotechnologist. IMPRESSION: Technically successful CT-guided bone marrow biopsy of the left iliac bone. Signed, Criselda Peaches, MD [facility] Radiology Electronically Signed   By: Jacqulynn Cadet M.D.   On: 10/25/2016 10:53    Assessment: 83 y.o. 83 y.o. woman with HTN and CKD, presented with hypercalcemia, worsening anemia since 06/2016,  s/p multiple fall, multiple hospitalization recently for back pain , CT scan showed T2 vertebral body compression fracture with mild height loss.  1. Hypercalcemia, improved  2. Acute encephalopathy, likely secondary to #1, improved  3. Worsening anemia  4. Acute on chronic CKD, improved  5. T2 compression fracture and severe back pain  6. 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 Tammi Klippel today regarding her bone marrow biopsy result. The aspirate showed abundant lymphocytes, flow cytometry was ordered. More definitive diagnosis will be made by the core bone marrow biopsy. We'll likely have preliminary result tomorrow. Unless she has aggressive lymphoma which may require inpt chemo, it's likely OK to discharge her tomorrow and she will follow up with me in my clinic.   Truitt Merle, MD 10/25/2016  10:20 PM

## 2016-10-25 NOTE — Procedures (Signed)
Interventional Radiology Procedure Note  Procedure: CT guided aspirate and core biopsy of right iliac bone Complications: None Recommendations: - Bedrest supine x 1 hrs - Hydrocodone PRN  Pain - Follow biopsy results   Signed,  Criselda Peaches, MD

## 2016-10-25 NOTE — Care Management Note (Signed)
Case Management Note  Patient Details  Name: Kristin [name] MRN: [id number] Date of Birth: 02/03/1931  Subjective/Objective:   Pt admitted with Acute Encephalopathy                Action/Plan: Plan to return home with daughter, private duty sitters and [facility]   Expected Discharge Date:    10/25/2016              Expected Discharge Plan:  [place]  In-House Referral:     Discharge planning Services  CM Consult  Post Acute Care Choice:  NA Choice offered to:  NA, Adult Children  DME Arranged:    DME Agency:  NA  HH Arranged:  PT, OT, RN [place] Agency:  [place]  Status of Service:     If discussed at [place] of Stay Meetings, dates discussed:    Additional CommentsPurcell Mouton, RN 10/25/2016, 12:13 PM

## 2016-10-26 DIAGNOSIS — W19XXXD Unspecified fall, subsequent encounter: Secondary | ICD-10-CM

## 2016-10-26 DIAGNOSIS — M546 Pain in thoracic spine: Secondary | ICD-10-CM

## 2016-10-26 DIAGNOSIS — E785 Hyperlipidemia, unspecified: Secondary | ICD-10-CM

## 2016-10-26 DIAGNOSIS — R112 Nausea with vomiting, unspecified: Secondary | ICD-10-CM

## 2016-10-26 DIAGNOSIS — R627 Adult failure to thrive: Secondary | ICD-10-CM

## 2016-10-26 DIAGNOSIS — N183 Chronic kidney disease, stage 3 (moderate): Secondary | ICD-10-CM

## 2016-10-26 DIAGNOSIS — R42 Dizziness and giddiness: Secondary | ICD-10-CM

## 2016-10-26 LAB — MAGNESIUM: MAGNESIUM: 1.4 mg/dL — AB (ref 1.7–2.4)

## 2016-10-26 LAB — CBC
HCT: 22.3 % — ABNORMAL LOW (ref 36.0–46.0)
Hemoglobin: 7.6 g/dL — ABNORMAL LOW (ref 12.0–15.0)
MCH: 32.1 pg (ref 26.0–34.0)
MCHC: 34.1 g/dL (ref 30.0–36.0)
MCV: 94.1 fL (ref 78.0–100.0)
PLATELETS: 187 10*3/uL (ref 150–400)
RBC: 2.37 MIL/uL — AB (ref 3.87–5.11)
RDW: 19.1 % — AB (ref 11.5–15.5)
WBC: 8 10*3/uL (ref 4.0–10.5)

## 2016-10-26 LAB — BASIC METABOLIC PANEL
ANION GAP: 6 (ref 5–15)
BUN: 15 mg/dL (ref 6–20)
CALCIUM: 11.3 mg/dL — AB (ref 8.9–10.3)
CO2: 23 mmol/L (ref 22–32)
Chloride: 110 mmol/L (ref 101–111)
Creatinine, Ser: 1.07 mg/dL — ABNORMAL HIGH (ref 0.44–1.00)
GFR, EST AFRICAN AMERICAN: 54 mL/min — AB (ref >60)
GFR, EST NON AFRICAN AMERICAN: 46 mL/min — AB (ref >60)
GLUCOSE: 101 mg/dL — AB (ref 65–99)
POTASSIUM: 3.5 mmol/L (ref 3.5–5.1)
Sodium: 139 mmol/L (ref 135–145)

## 2016-10-26 LAB — LIPID PANEL
CHOLESTEROL: 95 mg/dL (ref 0–200)
HDL: 28 mg/dL — AB (ref >40)
LDL CALC: 43 mg/dL (ref 0–99)
TRIGLYCERIDES: 118 mg/dL (ref <150)
Total CHOL/HDL Ratio: 3.4 RATIO
VLDL: 24 mg/dL (ref 0–40)

## 2016-10-26 LAB — GLUCOSE, CAPILLARY: Glucose-Capillary: 118 mg/dL — ABNORMAL HIGH (ref 65–99)

## 2016-10-26 MED ORDER — MAGNESIUM OXIDE 400 (241.3 MG) MG PO TABS: 400.0000 mg | ORAL_TABLET | Freq: Every day | ORAL | 0 refills | Status: DC

## 2016-10-26 MED ORDER — MAGNESIUM OXIDE 400 (241.3 MG) MG PO TABS
400.0000 mg | ORAL_TABLET | Freq: Every day | ORAL | Status: DC
  Filled 2016-10-26: qty 1

## 2016-10-26 NOTE — Progress Notes (Signed)
Bio tach here to apply back brace. Will provide discharge education once brace is done.

## 2016-10-26 NOTE — Progress Notes (Signed)
Physical Therapy Treatment Patient Details Name: Kristin [name] MRN: [id number] DOB: May 18, 1931 Today's Date: 10/26/2016    History of Present Illness Kristin [name] is a 83 y.o. female with medical history significant of chronic back pain, hypertension, hyperlipidemia, vertigo, anemia, chronic kidney disease-stage III, who presents with intractable back pain--> UTI/sepsis; MRI-Lumbar showed multilevel degenerative disc disease, but no spinal cord compression  3/11 -pt has an order for a clavicle strap.  3/13 spoke with MD regarding clavicle strap . Md wrote order for sling for LUE.  OT called ortho tech this day regarding order.                   PT Comments    Tx withheld till TLSO was delivered and applied by [facility].  Daughter reports BIO Tech showed her how to aplly and remove and instructed on use for while pt OOB.  On arrival, pt was sitting EOB.  Sling was not on.  Initially pt declined but with some education about her clavicle Fx she did agree.  Pt required much education to avoid using L UE esp with sit tostand and stand to sit even with sling on.  Pt is too unsteady for a cane, so advised daughter to use WC for long distances and HHA for short.  Reviewed back brace one last time.  Addressed other mobility concerns.  Pt ready for D/C to home   Follow Up Recommendations  Home health PT;Supervision/Assistance - 24 hour     Equipment Recommendations  None recommended by PT    Recommendations for Other Services       Precautions / Restrictions Precautions Precautions: Fall Precaution Comments: L UE sling for non displaced clavical Fx and TLSO for L4 L5 Comp Fx Required Braces or Orthoses: Sling;Spinal Brace Restrictions Weight Bearing Restrictions: Yes Other Position/Activity Restrictions: dr. has limited pushing and pulling with LUE due to clavicle fx that is not healing.  Pt has difficult time following through on this.  Treating pt as NWB seems to work better.     Mobility  Bed Mobility               General bed mobility comments: sitting on EOB on arrival  Transfers Overall transfer level: Needs assistance Equipment used: 2 person hand held assist Transfers: Sit to/from Stand Sit to Stand: Min assist         General transfer comment: 75% VC's to avoid pushing up using L EU.  With daughter, assisted with sit tostand and stand to sit twice.  Instructed on safety with turn complwetion and control decend.    Ambulation/Gait Ambulation/Gait assistance: Mod assist Ambulation Distance (Feet): 5 Feet Assistive device: 2 person hand held assist Gait Pattern/deviations: Step-to pattern;Decreased step length - right;Decreased step length - left;Shuffle;Trunk flexed Gait velocity: decreased   General Gait Details: 75% VC's to avoid using L UE.  sling in place.  Unsteady gait with limited amb distance due to fatigue and mild dyspnea.  pt too unsteady for an AD such as a cane.  Rec to daughter to assist and use w/c for long distances.     Stairs            Wheelchair Mobility    Modified Rankin (Stroke Patients Only)       Balance  Cognition Arousal/Alertness: Awake/alert Behavior During Therapy: WFL for tasks assessed/performed Overall Cognitive Status: Within Functional Limits for tasks assessed                      Exercises      General Comments        Pertinent Vitals/Pain Pain Assessment: Faces Faces Pain Scale: Hurts a little bit Pain Location: neck Pain Descriptors / Indicators: Discomfort;Grimacing Pain Intervention(s): Monitored during session;Repositioned    Home Living                      Prior Function            PT Goals (current goals can now be found in the care plan section)      Frequency    Min 3X/week      PT Plan Current plan remains appropriate    Co-evaluation             End of Session Equipment  Utilized During Treatment: Gait belt Activity Tolerance: Patient tolerated treatment well Patient left: in chair;with call bell/phone within reach;with family/visitor present Nurse Communication:  (pt ready for D/C instructions) PT Visit Diagnosis: Other abnormalities of gait and mobility (R26.89)     Time: 5217-4715 PT Time Calculation (min) (ACUTE ONLY): 15 min  Charges:  $Gait Training: 8-22 mins                    G Codes:       Rica Koyanagi  PTA [facility]  Acute  Rehab Pager      [phone]

## 2016-10-26 NOTE — Progress Notes (Signed)
Discharge education completed. Patient and daughter deny any questions.

## 2016-10-26 NOTE — Progress Notes (Signed)
Patient requesting back brace for the pain she has in her back when she ambulates. MD notified and ordered back brace. Bio tech called for brace. Awaiting brace to be delivered and fitted. Will continue to monitor.

## 2016-10-26 NOTE — Progress Notes (Signed)
MD, bedside RN and this CM informed pt and daughter that pt was ready for discharge today. MD also stated that oncology MD would come around 1200 if pt was still here to see pt.  Pt will follow up as OP with oncology next week. Oncology will make appointment for pt. Kristin [name] was made aware of pt being discharged today. Daughter will transport pt home. Daughter was offered [employer] for transportation, however declined.

## 2016-10-26 NOTE — Progress Notes (Signed)
Oncology brief follow-up note  I discussed the preliminary bone marrow biopsy result with pt and her daughter. Her marrow is hypercellular, flow cytometry was negative for monoclonal B-cell, this is unlikely high-grade lymphoma, suspicious for multiple myeloma. Pathologist Dr. Tammi Klippel has ordered [facility] to confirm the diagnosis.   I will see her back early next week, OK to discharge from hematological stand[point   Truitt Merle  10/26/2016

## 2016-10-26 NOTE — Discharge Summary (Signed)
Physician Discharge Summary  Kristin [name]:[id number] DOB: [birth year] DOA: 10/18/2016  PCP: [name], MD  Admit date: 10/18/2016 Discharge date: 10/26/2016  Admitted From: Home Disposition: [place] PT/OT/RN; OT Recommended SNF but family wants to take patient Home with Home Health  Recommendations for Outpatient Follow-up:  1. Follow up with PCP Dr. Jacelyn Grip on 10/27/16 at 3:30 pm 2. Follow up with Dr. Truitt Merle of Oncology within 1 week; Cancer Center to call to schedule follow up. 3. Please obtain BMP/CBC and Calcium and Magnesium Level with in one week 4. Please follow up on the following pending results: CT-Guided Bone Marrow Biopsy Results and Flow Cytometry  Home Health: YES Equipment/Devices: TLSO Brace  Discharge Condition: Stable CODE STATUS: FULL CODE Diet recommendation: Heart Healthy / Carb Modified / Regular / Dysphagia   Brief/Interim Summary: The patient is a 83 y.o. female with medical history significant of Hypertension, Hyperlipidemia, Vertigo, Depression, GI bleeding, Chronic back pain,Chronic kidney disease-stage III and other comorbids who presented with Altered Mental Status. Per patient's daughter, patient was recently discharged from a nursing home. She become confused in the past 3 days. She is less active than her baseline. Per her daugters, pt had fall on 10/02/16 in SNF, and was seen in ED, had negative CT-head, Ct-C spin and X-ray of left shoulder, but pt still has pain in the left shoulder and mild swelling. Per her daughter, pt dose not seem to have chest pain, SOB, cough, fever, chills, nausea, vomiting, diarrhea, abdominal pain. Was worked up and found to have a Calcium level of > 15. She was treated for her Hypercalcemia with calcitonin, palmidronate, and gentle IV Hydration. She was found to have a healing non-displaced fracture of the Left Clavicular Heard and SPEP with small M Spike. She underwent CT Guided aspirate and core Biopsy of Right iliac  bone and Dr. Burr Medico of oncology was consulted for her paraproteinemia, MGUS vs Ms. She steadily improved in her mentation and after discussion with Dr. Burr Medico it is ok to D/C her home with Home health and she will follow up with her PCP on 10/27/16 at 3:30 pm and with Dr. Burr Medico of Oncology as an outpatient.   Discharge Diagnoses:  Principal Problem:   Acute encephalopathy Active Problems:   Acute renal failure superimposed on stage 3 chronic kidney disease (HCC)   HLD (hyperlipidemia)   HTN (hypertension)   Vertigo   Anemia   Back pain   Hypercalcemia  Hypercalcemia:  -Ca > 15 on admission -PTH wnl, PTH related peptide unremakable, vitamin D level high normal, urine calcium 17.8, upep with elevated free lambda light chain, spep with small mspike,  Serum Kappa/lambda light chair pending, skeletal survey result noted,  -Oncology Dr. Burr Medico consulted,  bone marrow biopsy on 3/13, biopsy result pending -She received calcitonin for total of three days and pamidronate 61m x1 iv on 3/12 -Calcium Level now 11.3 -Continued Gentle hydration while hospitalized, she is instructed to restart on boniva a month from now. -Repeat Ca2+ in am, patient to follow up with pmd on Thursday to repeat calcium level  Metabolic encephalopathy, improved:  -Likely from hypercalcemia, ua no infection -Recent ct head a two weeks ago , no acute findings, considered mri brain If mental status dose not improve with correcting calcium. -Mental status improved, aaox3 since 3/11  Paraproteinemia, MGUS vs MS -Bone marrow biopsy results and Flow Cytometry Pending; Per Dr. FBurr Medicopreliminary results do not indicate Aggressive High Grade Lymphoma but results will be discussed  as an outpatient with Dr. Burr Medico -Discussed with Dr. Burr Medico of Oncology and ok to D/C and follow up with as an outpatient within 1 week  N/V, improved  -unclear etiology, speech eval, no significant swallow problem,  -Family report patient started to vomit a few  minutes after eating, she seems to have trouble with solid, no problem with meds or liquid -kub unremarkable,  dg esophagus shows esophagus dysmotility, Hiatal hernia with potential gastric diverticulum, no high grade obstruction. -Patient is encouraged to have smaller meals each time and multiple time a day, she needs to sit up eating  Hypokalemia/hypomagnesemia:  -replace k/mag -Mag level low at D/C at 1.4 so will write for Mag Oxide 400 mg daily x 7 days.  AKI on CKDIII, -likely from dehydration, cr improved with hydration,  -Gentle initially ivf held due to pleural effusion,  spep with small m spike, upep with elevated light chain -BUN/Cr stable at 15/1.07 -Repeat CMP as an outpatient  Anemia, Macrocytic, -Recent b12/folate/iron study wnl , tsh 1.7,  fobt negative, Spep/upep as above, bone marrow biopsy result pending -hgb dropped to 6.5, prbc transfusion x1 on 3/11 -Hb/Hct Stable at 7.6/22.3 -Hematology/oncology input appreciated and follow up as an outpatient   HLD: -Lipid Panel showed Cholesterol of 95, HDL of 28, LDL of 43, TG of 118 and VLDL of 24 -zetia stopped, ldl 100 in 2015  HTN: -Oral Bp meds hold initially due to confusion, IV hydralazine when necessary -Mental status improved, back on oral meds, resume Norvasc at D/C  -D/c lisinopril/hctz due to renal impariment  H/o Vertigo: -C/w prn meclizine  T2 and T4 vertebral compression fracture:  -Dr. Erlinda Hong discussed with neurosurgery Dr Saintclair Halsted who recommended conservative management with back braces if needed. -Currently patient admitted to pain so TLSO Brace Ordered when patient is OOB  Healing nondisplaced fracture of the left clavicular head with severe surrounding callus formation and soft tissue swelling likely accounting for the palpable abnormality. -Supportive measures, back strap and shoulder sling  FTT,  -C/w Ensure Enlive 237 mL po BID Supplements -Family states can provide 24/7 care  Discharge  Instructions  Discharge Instructions    Call MD for:  difficulty breathing, headache or visual disturbances    Complete by:  As directed    Call MD for:  extreme fatigue    Complete by:  As directed    Call MD for:  persistant dizziness or light-headedness    Complete by:  As directed    Call MD for:  persistant nausea and vomiting    Complete by:  As directed    Call MD for:  severe uncontrolled pain    Complete by:  As directed    Call MD for:  temperature >100.4    Complete by:  As directed    Diet - low sodium heart healthy    Complete by:  As directed    Discharge instructions    Complete by:  As directed    Follow up with PCP and with Oncology within 1 week. Take all medications as prescribed. If symptoms change or worsen please return to the ED for evaluation.   Face-to-face encounter (required for Medicare/Medicaid patients)    Complete by:  As directed    I Xu,Fang certify that this patient is under my care and that I, or a nurse practitioner or physician's assistant working with me, had a face-to-face encounter that meets the physician face-to-face encounter requirements with this patient on 10/22/2016. The encounter with the  patient was in whole, or in part for the following medical condition(s) which is the primary reason for home health care (List medical condition): FTT   The encounter with the patient was in whole, or in part, for the following medical condition, which is the primary reason for home health care:  FTT   I certify that, based on my findings, the following services are medically necessary home health services:   Nursing Physical therapy     Reason for Medically Necessary Home Health Services:  Skilled Nursing- Change/Decline in Patient Status   My clinical findings support the need for the above services:  Pain interferes with ambulation/mobility   Further, I certify that my clinical findings support that this patient is homebound due to:  Mental confusion    Home Health    Complete by:  As directed    To provide the following care/treatments:   PT RN OT     Increase activity slowly    Complete by:  As directed      Allergies as of 10/26/2016      Reactions   Alendronate Nausea Only, Other (See Comments)   Hip pain   Penicillins Rash   Has patient had a PCN reaction causing immediate rash, facial/tongue/throat swelling, SOB or lightheadedness with hypotension: no Has patient had a PCN reaction causing severe rash involving mucus membranes or skin necrosis: no Has patient had a PCN reaction that required hospitalization no Has patient had a PCN reaction occurring within the last 10 years:no If all of the above answers are "NO", then may proceed with Cephalosporin use.   Sulfonamide Derivatives Itching, Rash      Medication List    STOP taking these medications   CENTRUM SILVER PO   ezetimibe 10 MG tablet Commonly known as:  ZETIA   losartan-hydrochlorothiazide 50-12.5 MG tablet Commonly known as:  HYZAAR   oxyCODONE-acetaminophen 5-325 MG tablet Commonly known as:  PERCOCET/ROXICET   risperiDONE 0.5 MG tablet Commonly known as:  RISPERDAL     TAKE these medications   amLODipine 5 MG tablet Commonly known as:  NORVASC Take 1 tablet (5 mg total) by mouth daily.   bisacodyl 10 MG suppository Commonly known as:  DULCOLAX Place 1 suppository (10 mg total) rectally daily as needed for moderate constipation.   feeding supplement (ENSURE ENLIVE) Liqd Take 237 mLs by mouth 2 (two) times daily between meals.   ibandronate 150 MG tablet Commonly known as:  BONIVA Take 1 tablet (150 mg total) by mouth every 30 (thirty) days. Take in the morning with a full glass of water, on an empty stomach, and do not take anything else by mouth or lie down for the next 30 min. Start taking on:  11/25/2016   meclizine 25 MG tablet Commonly known as:  ANTIVERT TAKE ONE TABLET BY MOUTH THREE TIMES DAILY AS NEEDED What changed:  how much to  take  how to take this  when to take this  reasons to take this  additional instructions   polyethylene glycol powder powder Commonly known as:  GLYCOLAX/MIRALAX Take 17 g by mouth daily.   RESTASIS 0.05 % ophthalmic emulsion Generic drug:  cycloSPORINE Place 1 drop into both eyes daily.   traMADol 50 MG tablet Commonly known as:  ULTRAM Take 50 mg by mouth every 6 (six) hours as needed.      Follow-up Information    [name], MD. Go in 3 day(s).   Specialty:  Family Medicine Why:  hospital discharge follow up, repeat cbc/bmp including calcium at follow up on 10/27/16 at 3:30 pm Contact information: [place] [id number] [phone]        Truitt Merle, MD. Call.   Specialties:  Hematology, Oncology Why:  Hospital Follow Up within 1 week; Cancer Center to call and schedule an appointment Contact information: [address] [place] [address] [phone]          Allergies  Allergen Reactions  . Alendronate Nausea Only and Other (See Comments)    Hip pain  . Penicillins Rash    Has patient had a PCN reaction causing immediate rash, facial/tongue/throat swelling, SOB or lightheadedness with hypotension: no Has patient had a PCN reaction causing severe rash involving mucus membranes or skin necrosis: no Has patient had a PCN reaction that required hospitalization no Has patient had a PCN reaction occurring within the last 10 years:no If all of the above answers are "NO", then may proceed with Cephalosporin use.    . Sulfonamide Derivatives Itching and Rash   Consultations:  Oncology Dr. Truitt Merle  Interventional Radiology Dr. Annamaria Boots  Neurosurgery via phone conversation with Dr. Saintclair Halsted  Procedures/Studies: Dg Chest 2 View  Result Date: 10/18/2016 CLINICAL DATA:  Initial evaluation for acute altered mental status, recent fall. EXAM: CHEST  2 VIEW COMPARISON:  Prior radiograph from 10/02/2016. FINDINGS: Moderate  cardiomegaly, stable. Mediastinal silhouette within normal limits. Aortic atherosclerosis with tortuosity the intrathoracic aorta. Lungs are hypoinflated. Diffusely increased vascular congestion with interstitial prominence, suggesting pulmonary interstitial congestion/ edema. Increased opacity at the right lung base may reflect atelectasis/ edema or infiltrate. No other focal infiltrates. No pneumothorax. No acute osseous abnormality. Patient status post vertebral augmentation at T9. Degenerative changes noted about the shoulders bilaterally. IMPRESSION: 1. Stable cardiomegaly with diffuse increased vascular congestion and interstitial prominence, suggesting pulmonary interstitial edema. 2. More confluent opacity at the right lung base. While this finding may reflect atelectasis and/or edema/congestion, possible infiltrate could also be considered. 3. Aortic atherosclerosis. Electronically Signed   By: Jeannine Boga M.D.   On: 10/18/2016 22:02   Dg Ribs Unilateral W/chest Left  Result Date: 10/02/2016 CLINICAL DATA:  Pain following fall EXAM: LEFT RIBS AND CHEST - 3+ VIEW COMPARISON:  September 23, 2016 chest radiograph FINDINGS: Frontal chest as well as oblique and cone-down lower rib images obtained. There is no edema or consolidation. Heart is mildly enlarged with pulmonary vascular within normal limits. There is aortic atherosclerosis. No adenopathy. Patient is status post kyphoplasty procedure at T9. There is degenerative change in the thoracic spine. There is no pneumothorax or pleural effusion. No evident rib fracture. IMPRESSION: No evident rib fracture. Lungs clear. Stable cardiomegaly. There is aortic atherosclerosis. Status post kyphoplasty procedure at T9. Electronically Signed   By: Lowella Grip III M.D.   On: 10/02/2016 13:48   Dg Wrist Complete Left  Result Date: 10/02/2016 CLINICAL DATA:  [place] today, BILATERAL wrist pain, BILATERAL hip pain EXAM: LEFT WRIST - COMPLETE 3+ VIEW  COMPARISON:  None FINDINGS: Osseous demineralization. Degenerative changes at first [facility] joint. Widened scapholunate interval consistent with scapholunate ligament tear. Degenerative changes at the distal radioulnar joint. No acute fracture, dislocation, or bone destruction. IMPRESSION: Osseous demineralization with degenerative changes at first [facility] joint and distal radioulnar joint. Torn scapholunate ligament. No acute osseous abnormalities. Electronically Signed   By: Lavonia Dana M.D.   On: 10/02/2016 13:49   Dg Wrist Complete Right  Result Date: 10/02/2016 CLINICAL DATA:  [place]  today, BILATERAL wrist pain, BILATERAL hip pain EXAM: RIGHT WRIST - COMPLETE 3+ VIEW COMPARISON:  None FINDINGS: Osseous demineralization. Widened scapholunate interval consistent with torn scapholunate ligament. No acute fracture, dislocation, or bone destruction. Atherosclerotic calcifications within the radial artery. IMPRESSION: Osseous demineralization with evidence of a torn scapholunate ligament. No acute abnormalities. Electronically Signed   By: Lavonia Dana M.D.   On: 10/02/2016 13:49   Dg Abd 1 View  Result Date: 10/22/2016 CLINICAL DATA:  N/V, family report patient started to vomit a few minutes after eating, she seems to have trouble with solid, not problem with meds or liquid. H/o CKD stage III, Hiatal hernia, upper GI bleed in 2005. Surgical h/o cholecystectomy in 2009. EXAM: ABDOMEN - 1 VIEW COMPARISON:  None. FINDINGS: Nonobstructive bowel gas pattern. No pneumoperitoneum, pneumatosis or portal venous gas. Limited visualization of lower thorax is normal. Moderate multilevel thoracolumbar spine degenerative change, incompletely evaluated. Post lower thoracic vertebral body augmentation, incompletely evaluated. Post cholecystectomy. IMPRESSION: Nonobstructive bowel gas pattern. Electronically Signed   By: Sandi Mariscal M.D.   On: 10/22/2016 14:31   Ct Head Wo Contrast  Result Date: 10/02/2016 CLINICAL DATA:  Pain  following fall EXAM: CT HEAD WITHOUT CONTRAST CT CERVICAL SPINE WITHOUT CONTRAST TECHNIQUE: Multidetector CT imaging of the head and cervical spine was performed following the standard protocol without intravenous contrast. Multiplanar CT image reconstructions of the cervical spine were also generated. COMPARISON:  Head CT February 10, 2011; cervical spine CT April 26, 2007 FINDINGS: CT HEAD FINDINGS Brain: There is mild diffuse atrophy. There is no intracranial mass, hemorrhage, extra-axial fluid collection, or midline shift. There is mild patchy small vessel disease in the centra semiovale bilaterally. Elsewhere gray-white compartments appear normal. No acute infarct evident. Vascular: No hyperdense vessels. There is calcification in both carotid siphon regions bilaterally. Skull: There is a state apparent either calcified meningioma or enostosis arising from the posterior left frontal region measuring 2.5 x 1.4 cm, stable. There is no surrounding edema or mass effect in this area. No fracture evident. Bones are somewhat osteopenic. Sinuses/Orbits: There is mild mucosal thickening in several ethmoid air cells bilaterally. Visualized paranasal sinuses elsewhere clear. Orbits appear symmetric bilaterally. Other: There are scattered areas of inferior mastoid air cell opacity bilaterally. Most of the mastoids bilaterally are clear. CT CERVICAL SPINE FINDINGS Alignment: There is no spondylolisthesis. Skull base and vertebrae: Skull base and craniocervical junction regions appear normal. Bones are osteoporotic. There is no demonstrable acute fracture. An oblique lucency in the posterior inferior odontoid is stable compared to 2008 study is felt to represent a prominent nutrient foramen presence of osteoporosis. There are no blastic or lytic bone lesions. Soft tissues and spinal canal: Prevertebral soft tissues and predental space regions are normal. There is no paraspinous lesion. There is no cord or canal hematoma  demonstrable. No spinal stenosis. Disc levels: There is marked disc space narrowing at C3-4, C5-6, and C6-7. There is moderate disc space narrowing at C4-5. There is facet hypertrophy at multiple levels. No disc extrusion evident. Upper chest: Limited visualization of the lung apices. Visualized regions clear. Other: There is carotid artery calcification on the left. IMPRESSION: CT head: Stable atrophy with mild periventricular small vessel disease. Areas of mastoid disease bilaterally. Mild mucosal thickening in several ethmoid air cells noted. No fracture. Benign calcified meningioma versus enostosis in the posterior left frontal region without mass effect or edema. This area appears benign and stable and is not felt to have clinical significance. Foci of arterial  vascular calcification noted. CT cervical spine: No fracture or spondylolisthesis. Bones osteoporotic. There is multilevel osteoarthritic change. There is mild calcification in the left carotid artery. Electronically Signed   By: Lowella Grip III M.D.   On: 10/02/2016 14:32   Ct Soft Tissue Neck Wo Contrast  Result Date: 10/19/2016 CLINICAL DATA:  Fall 10/02/2016. Left shoulder pain and swelling. Dysphagia. EXAM: CT NECK WITHOUT CONTRAST TECHNIQUE: Multidetector CT imaging of the neck was performed following the standard protocol without intravenous contrast. COMPARISON:  CT cervical spine 10/02/2016 FINDINGS: Pharynx and larynx: Negative Salivary glands: Negative Thyroid: Negative Lymph nodes: No pathologic lymph nodes in the neck. Vascular: Limited evaluation due to lack of intravenous contrast. Calcification in the left internal carotid artery. Limited intracranial: Negative Visualized orbits: Negative Mastoids and visualized paranasal sinuses: Mild mucosal edema left maxillary sinus. Otherwise negative. Skeleton: Fracture of the left medial clavicle with mild displacement. Surrounding soft tissue calcification most compatible with calcifying  hematoma. No bony union. There is mild periosteal reaction. Reconstructed sagittal images demonstrate moderate fractures of T2 and T4. These show sclerosis and could be healing fractures. These were not scanned on the prior cervical spine CT. There is moderate disc degeneration and spurring throughout the cervical spine. Upper chest: Chest CT from today reported separately Other: None IMPRESSION: Subacute fracture left medial clavicle. Calcifying hematoma around the fracture site. This accounts for the palpable abnormality. Moderately severe compression fractures T2 and T4 which may be healing fractures. No mass or soft tissue edema in the neck. Electronically Signed   By: Franchot Gallo M.D.   On: 10/19/2016 15:57   Ct Chest Wo Contrast  Result Date: 10/19/2016 CLINICAL DATA:  Upon assessing pt, a hard, bony prominence noted at along the left throat/clavicle area. EXAM: CT CHEST WITHOUT CONTRAST TECHNIQUE: Multidetector CT imaging of the chest was performed following the standard protocol without IV contrast. COMPARISON:  None. FINDINGS: Bones/Joint/Cartilage Healing nondisplaced fracture of the left clavicular head with severe surrounding callus formation and soft tissue swelling likely accounting for the palpable abnormality. No sternoclavicular dislocation. Mild osteoarthritis of the right sternoclavicular joint. There is suggestion of a lytic lesion at the fracture site which may be secondary to metastatic disease or multiple myeloma versus osteolysis secondary to the fracture. Mild arthropathy of bilateral acromioclavicular joints. No aggressive lytic or sclerotic osseous lesion. Degenerative disc disease with disc height loss C3-4, C5-6 and C6-7. Mild broad-based disc bulge at C3-4 with bilateral mild facet arthropathy. Bilateral uncovertebral degenerative changes at C4-5 and C5-6 with bilateral facet arthropathy and foraminal stenosis. Broad-based disc osteophyte complex at C6-7 with bilateral facet  arthropathy. Bilateral facet arthropathy at C7-T1. T2 vertebral body compression fracture likely acute. Lucent lesion in the left lateral aspect of the T2 vertebral body which may reflect metastatic disease versus multiple myeloma. Chronic T4 vertebral body compression fracture. Ligaments Ligaments are suboptimally evaluated by CT. Muscles and Tendons Muscles are normal.  No muscle atrophy. Soft tissue No fluid collection or hematoma. No soft tissue mass. Small right pleural effusion. IMPRESSION: 1. Healing nondisplaced fracture of the left clavicular head with severe surrounding callus formation and soft tissue swelling likely accounting for the palpable abnormality. There is suggestion of a lytic lesion at the fracture site which may be secondary to metastatic disease or multiple myeloma versus osteolysis secondary to the fracture. 2. Acute T2 vertebral body compression fracture with mild height loss. No retropulsion into the spinal canal. Lucent lesion in the left lateral aspect of the T2 vertebral body  which may reflect metastatic disease versus multiple myeloma. Electronically Signed   By: Kathreen Devoid   On: 10/19/2016 15:56   Ct Cervical Spine Wo Contrast  Result Date: 10/02/2016 CLINICAL DATA:  Pain following fall EXAM: CT HEAD WITHOUT CONTRAST CT CERVICAL SPINE WITHOUT CONTRAST TECHNIQUE: Multidetector CT imaging of the head and cervical spine was performed following the standard protocol without intravenous contrast. Multiplanar CT image reconstructions of the cervical spine were also generated. COMPARISON:  Head CT February 10, 2011; cervical spine CT April 26, 2007 FINDINGS: CT HEAD FINDINGS Brain: There is mild diffuse atrophy. There is no intracranial mass, hemorrhage, extra-axial fluid collection, or midline shift. There is mild patchy small vessel disease in the centra semiovale bilaterally. Elsewhere gray-white compartments appear normal. No acute infarct evident. Vascular: No hyperdense vessels.  There is calcification in both carotid siphon regions bilaterally. Skull: There is a state apparent either calcified meningioma or enostosis arising from the posterior left frontal region measuring 2.5 x 1.4 cm, stable. There is no surrounding edema or mass effect in this area. No fracture evident. Bones are somewhat osteopenic. Sinuses/Orbits: There is mild mucosal thickening in several ethmoid air cells bilaterally. Visualized paranasal sinuses elsewhere clear. Orbits appear symmetric bilaterally. Other: There are scattered areas of inferior mastoid air cell opacity bilaterally. Most of the mastoids bilaterally are clear. CT CERVICAL SPINE FINDINGS Alignment: There is no spondylolisthesis. Skull base and vertebrae: Skull base and craniocervical junction regions appear normal. Bones are osteoporotic. There is no demonstrable acute fracture. An oblique lucency in the posterior inferior odontoid is stable compared to 2008 study is felt to represent a prominent nutrient foramen presence of osteoporosis. There are no blastic or lytic bone lesions. Soft tissues and spinal canal: Prevertebral soft tissues and predental space regions are normal. There is no paraspinous lesion. There is no cord or canal hematoma demonstrable. No spinal stenosis. Disc levels: There is marked disc space narrowing at C3-4, C5-6, and C6-7. There is moderate disc space narrowing at C4-5. There is facet hypertrophy at multiple levels. No disc extrusion evident. Upper chest: Limited visualization of the lung apices. Visualized regions clear. Other: There is carotid artery calcification on the left. IMPRESSION: CT head: Stable atrophy with mild periventricular small vessel disease. Areas of mastoid disease bilaterally. Mild mucosal thickening in several ethmoid air cells noted. No fracture. Benign calcified meningioma versus enostosis in the posterior left frontal region without mass effect or edema. This area appears benign and stable and is not  felt to have clinical significance. Foci of arterial vascular calcification noted. CT cervical spine: No fracture or spondylolisthesis. Bones osteoporotic. There is multilevel osteoarthritic change. There is mild calcification in the left carotid artery. Electronically Signed   By: Lowella Grip III M.D.   On: 10/02/2016 14:32   Mr Lumbar Spine W Wo Contrast  Result Date: 09/27/2016 CLINICAL DATA:  Low back pain. Weakness with inability to ambulate. Loss of bowel and bladder control. Recent spinal steroid injection. EXAM: MRI LUMBAR SPINE WITHOUT AND WITH CONTRAST TECHNIQUE: Multiplanar and multiecho pulse sequences of the lumbar spine were obtained without and with intravenous contrast. CONTRAST:  21m MULTIHANCE GADOBENATE DIMEGLUMINE 529 MG/ML IV SOLN COMPARISON:  09/23/2016 FINDINGS: Segmentation: The lowest fully formed intervertebral disc space is designated L5-S1 as on the recent prior MRI. Alignment: Unchanged alignment with slight retrolisthesis of L1 on L2 and L2 on L3 an 9 mm anterolisthesis of L5 on S1. Mild lumbar dextroscoliosis with apex at L2. Vertebrae: Chronic T10 compression fracture  status post augmentation. Chronic L3 compression fracture without marrow edema. Edema and enhancement are again partially visualized in the right greater than left sacral ala and extending anteriorly across the S2 segment, similar to the recent prior examination. Conus medullaris: Extends to the L2 level and appears normal. Similar appearance of mild dural enhancement extending from L3 into the sacrum. The nerve roots of the cauda equina are displaced predominantly centrally within the thecal sac in a configuration compatible with an underlying subdural fluid collection. This is overall similar in appearance to the recent prior examination, though with slightly increased prominence of the collection at L2-3 and stable to slightly decreased prominence of the collection more inferiorly which may reflect some  redistribution. The collection extends inferiorly into the sacral canal, likely extends as far cephalad as the L1-2 level, and is new from an 04/14/2011 MRI. No epidural or paraspinal fluid collection is identified. 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 Paraspinal and other soft tissues: Partially visualized bilateral renal cysts, left larger than right. Similar appearance of extrahepatic biliary dilatation with the common bile duct measuring approximately 13 mm in diameter. Disc levels: Advanced lumbar disc and facet degeneration is unchanged from the recent prior MRI, worst at L4-5 and L5-S1 where there is moderate spinal stenosis and moderate to severe neural foraminal stenosis as previously described. IMPRESSION: 1. Subdural fluid collection in the lumbar and sacral spinal canal, potentially related to the patient's recent spinal injection. An infected collection is possible, however there is only mild dural enhancement which has not progressed from the recent prior MRI. 2. Similar appearance of marrow edema in the sacrum suggesting insufficiency fractures. 3. Advanced disc and facet degeneration as described on the recent prior MRI. Electronically Signed   By: Logan Bores M.D.   On: 09/27/2016 12:24   Dg Esophagus  Result Date: 10/24/2016 CLINICAL DATA:  Difficulty swallowing pre nausea vomiting EXAM: ESOPHOGRAM/BARIUM SWALLOW TECHNIQUE: Single contrast examination was performed using  thin. FLUOROSCOPY TIME:  Fluoroscopy Time:  2 minutes 30 seconds Radiation Exposure Index (if provided by the fluoroscopic device): 25.0 mGy Number of Acquired Spot Images: 10 COMPARISON:  CT 06/20/2016, 10/19/2016 FINDINGS: Limited exam due to patient immobility Esophagitis is mildly patulous. Contrast flows through the esophagus and GE junction without high-grade obstruction. There is poor esophageal motility with tertiary contractions. Hiatal hernia noted. 13 mm tablet was administered. Barium tablet traveled into the hiatal  hernia sac. Potential gastric diverticulum retains barium tablet. IMPRESSION: 1. Esophageal dysmotility. 2. Hiatal hernia with potential gastric diverticulum. Hiatal hernia versus diverticulum retains the barium tablet. 3. No high-grade obstruction. Electronically Signed   By: Suzy Bouchard M.D.   On: 10/24/2016 13:08   Ct Hip Left Wo Contrast  Result Date: 10/02/2016 CLINICAL DATA:  Fall today with bilateral hip pain. Indeterminate radiographs. EXAM: CT OF THE LEFT HIP WITHOUT CONTRAST TECHNIQUE: Multidetector CT imaging of the left hip was performed according to the standard protocol. Multiplanar CT image reconstructions were also generated. COMPARISON:  Radiograph same date and 03/05/2009. FINDINGS: Bones/Joint/Cartilage This examination is limited to the left hip and inferior left hemipelvis. The mineralization appears adequate. There is no evidence of acute fracture, dislocation or femoral head avascular necrosis. Mild subchondral cyst formation is present within the acetabulum. There are mild degenerative changes of the left sacroiliac joint. There is no significant hip joint effusion. Ligaments Not relevant for exam/indication. Muscles and Tendons Fatty atrophy within the gluteus minimus muscle. No hematoma or tendon rupture identified. Soft tissues No periarticular hematoma or  other fluid collection seen. There is iliofemoral atherosclerosis. IMPRESSION: No evidence of proximal left femur fracture or dislocation. Electronically Signed   By: Richardean Sale M.D.   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 EXAM: CT GUIDED BONE MARROW ASPIRATION AND CORE BIOPSY Interventional Radiologist:  Criselda Peaches, MD MEDICATIONS: None. ANESTHESIA/SEDATION: Moderate (conscious) sedation was employed during this procedure. A total of 1 milligrams versed and 50 micrograms fentanyl were administered intravenously.  The patient's level of consciousness and vital signs were monitored continuously by radiology nursing throughout the procedure under my direct supervision. Total monitored sedation time: 13 minutes FLUOROSCOPY TIME:  Fluoroscopy Time: 0 minutes 0 seconds (0 mGy). COMPLICATIONS: None immediate. Estimated blood loss: <25 mL PROCEDURE: Informed written consent was obtained from the patient after a thorough discussion of the procedural risks, benefits and alternatives. All questions were addressed. Maximal Sterile Barrier Technique was utilized including caps, mask, sterile gowns, sterile gloves, sterile drape, hand hygiene and skin antiseptic. A timeout was performed prior to the initiation of the procedure. The patient was positioned prone and non-contrast localization CT was performed of the pelvis to demonstrate the iliac marrow spaces. Maximal barrier sterile technique utilized including caps, mask, sterile gowns, sterile gloves, large sterile drape, hand hygiene, and betadine prep. Under sterile conditions and local anesthesia, an 11 gauge coaxial bone biopsy needle was advanced into the left iliac marrow space. Needle position was confirmed with CT imaging. Initially, bone marrow aspiration was performed. Next, the 11 gauge outer cannula was utilized to obtain a left iliac bone marrow core biopsy. Needle was removed. Hemostasis was obtained with compression. The patient tolerated the procedure well. Samples were prepared with the cytotechnologist. IMPRESSION: Technically successful CT-guided bone marrow biopsy of the left iliac bone. Signed, Criselda Peaches, MD [facility] Electronically Signed   By: Jacqulynn Cadet M.D.   On: 10/25/2016 10:53   Dg Chest Port 1 View  Result Date: 10/22/2016 CLINICAL DATA:  Cough EXAM: PORTABLE CHEST 1 VIEW COMPARISON:  10/19/2016 FINDINGS: Cardiac shadow is enlarged. Aortic calcifications are again seen. Changes  of prior vertebral augmentation are noted. The lungs are clear with the exception of nodular appearing density in the left upper lobe which corresponds to some pleural-based infiltrate IMPRESSION: Stable changes when compared with recent CT. No acute abnormality noted. Electronically Signed   By: Inez Catalina M.D.   On: 10/22/2016 14:45   Dg Shoulder Left  Result Date: 10/18/2016 CLINICAL DATA:  Initial evaluation for acute pain, status post recent fall. EXAM: LEFT SHOULDER - 2+ VIEW COMPARISON:  None available. FINDINGS: No acute fracture or dislocation. Humeral head in normal alignment with the glenoid. Degenerative osteophytic spurring present at the inferior aspect of the humeral head. AC joint is approximated. No periarticular calcification. Congestive changes noted within the partially visualized left lung. IMPRESSION: 1. No acute fracture or dislocation about the left shoulder. 2. Degenerative osteoarthrosis about the left glenohumeral joint. Electronically Signed   By: Jeannine Boga M.D.   On: 10/18/2016 22:04   Dg Shoulder Left  Result Date: 10/02/2016 CLINICAL DATA:  [place] today, bruising and pain lateral LEFT shoulder EXAM: LEFT SHOULDER - 2+ VIEW COMPARISON:  None FINDINGS: Osseous demineralization. Mild LEFT glenohumeral degenerative changes with joint space narrowing and spur formation. AC joint alignment normal. No acute fracture, dislocation, or bone destruction. Prior spinal augmentation procedure at the lower thoracic spine. Aortic atherosclerosis  noted. Visualized LEFT ribs intact. IMPRESSION: Osseous demineralization with LEFT glenohumeral degenerative changes. No acute osseous abnormalities. Aortic atherosclerosis. Electronically Signed   By: Lavonia Dana M.D.   On: 10/02/2016 13:47   Dg Bone Survey Met  Result Date: 10/21/2016 CLINICAL DATA:  Multiple myeloma. EXAM: METASTATIC BONE SURVEY COMPARISON:  Radiographs of April 25, 2016. MRI of March 18, 2011. FINDINGS: There is  interval development of mild compression deformity of L3 vertebral body concerning for fracture of indeterminate age. Status post kyphoplasty of T9 vertebral body. Interval development of moderate compression deformity involving T4 vertebral body concerning for pathologic fracture. Possible 2 small lucent lesions are seen in the skull posteriorly. IMPRESSION: Possible 2 small lucent lesions seen posteriorly in the skull which may represent multiple myeloma. Interval development of moderate compression deformity of T4 vertebral body concerning for possible pathologic fracture given history of multiple myeloma. Also noted is interval development of mild compression deformity of L3 vertebral body concerning for fracture of indeterminate age ; MRI may be performed further evaluation. Electronically Signed   By: Marijo Conception, M.D.   On: 10/21/2016 10:50   Ct Bone Marrow Biopsy & Aspiration  Result Date: 10/25/2016 INDICATION: Hypercalcemia and monoclonal gammopathy of uncertain significance. EXAM: CT GUIDED BONE MARROW ASPIRATION AND CORE BIOPSY Interventional Radiologist:  Criselda Peaches, MD MEDICATIONS: None. ANESTHESIA/SEDATION: Moderate (conscious) sedation was employed during this procedure. A total of 1 milligrams versed and 50 micrograms fentanyl were administered intravenously. The patient's level of consciousness and vital signs were monitored continuously by radiology nursing throughout the procedure under my direct supervision. Total monitored sedation time: 13 minutes FLUOROSCOPY TIME:  Fluoroscopy Time: 0 minutes 0 seconds (0 mGy). COMPLICATIONS: None immediate. Estimated blood loss: <25 mL PROCEDURE: Informed written consent was obtained from the patient after a thorough discussion of the procedural risks, benefits and alternatives. All questions were addressed. Maximal Sterile Barrier Technique was utilized including caps, mask, sterile gowns, sterile gloves, sterile drape, hand hygiene and  skin antiseptic. A timeout was performed prior to the initiation of the procedure. The patient was positioned prone and non-contrast localization CT was performed of the pelvis to demonstrate the iliac marrow spaces. Maximal barrier sterile technique utilized including caps, mask, sterile gowns, sterile gloves, large sterile drape, hand hygiene, and betadine prep. Under sterile conditions and local anesthesia, an 11 gauge coaxial bone biopsy needle was advanced into the left iliac marrow space. Needle position was confirmed with CT imaging. Initially, bone marrow aspiration was performed. Next, the 11 gauge outer cannula was utilized to obtain a left iliac bone marrow core biopsy. Needle was removed. Hemostasis was obtained with compression. The patient tolerated the procedure well. Samples were prepared with the cytotechnologist. IMPRESSION: Technically successful CT-guided bone marrow biopsy of the left iliac bone. Signed, Criselda Peaches, MD [facility] Radiology Electronically Signed   By: Jacqulynn Cadet M.D.   On: 10/25/2016 10:53   Dg Hips Bilat With Pelvis 3-4 Views  Result Date: 10/02/2016 CLINICAL DATA:  [place] today, BILATERAL hip pain, BILATERAL wrist pain EXAM: DG HIP (WITH OR WITHOUT PELVIS) 3-4V BILAT COMPARISON:  None FINDINGS: Osseous demineralization. Hip and SI joint spaces symmetric and preserved. Proximal RIGHT femur appears intact. Questionable abnormal appearance at the LEFT femoral head/neck junction, could be related to rotation of the leg but subtle fracture not completely excluded. No additional fracture, dislocation or bone destruction. Degenerative disc and facet disease changes at visualized lower lumbar spine. Scattered atherosclerotic calcifications.  IMPRESSION: Osseous demineralization. Proximal RIGHT femur appears intact but a subtle subcapital fracture of the LEFT femoral neck is not completely excluded ; if patient has  persistent pain at this site recommend additional imaging. Electronically Signed   By: Lavonia Dana M.D.   On: 10/02/2016 13:54    Subjective: Patient feels better and has no N/V. States Left Shoulder hurting. No other concerns or complaints and daughter at bedside. Daughter was upset about plan not being clearly discussed to her about Discharge yesterday.   Discharge Exam: Vitals:   10/25/16 2052 10/26/16 0349  BP: (!) 142/61 (!) 142/55  Pulse: 62 93  Resp: 18 18  Temp: 98.8 F (37.1 C) 98.6 F (37 C)   Vitals:   10/25/16 1100 10/25/16 1430 10/25/16 2052 10/26/16 0349  BP: (!) 146/87 130/70 (!) 142/61 (!) 142/55  Pulse: 95 92 62 93  Resp: _0 Temp: 99.2 F (37.3 C) 99 F (37.2 C) 98.8 F (37.1 C) 98.6 F (37 C)  TempSrc: Oral Oral Oral Oral  SpO2: 97% 97% 98% 99%  Weight:      Height:       General: Pt is alert, awake, not in acute distress Cardiovascular: RRR, S1/S2 +, no rubs, no gallops Respiratory: CTA bilaterally, no wheezing, no rhonchi Abdominal: Soft, NT, ND, bowel sounds + Extremities: no edema, no cyanosis  The results of significant diagnostics from this hospitalization (including imaging, microbiology, ancillary and laboratory) are listed below for reference.    Microbiology: Recent Results (from the past 240 hour(s))  Urine culture     Status: Abnormal   Collection Time: 10/19/16  7:55 AM  Result Value Ref Range Status   Specimen Description URINE, CLEAN CATCH  Final   Special Requests NONE  Final   Culture MULTIPLE SPECIES PRESENT, SUGGEST RECOLLECTION (A)  Final   Report Status 10/20/2016 FINAL  Final    Labs: BNP (last 3 results) No results for input(s): BNP in the last 8760 hours. Basic Metabolic Panel:  Recent Labs Lab 10/22/16 0544 10/23/16 0519 10/24/16 0537 10/25/16 0622 10/26/16 0610  NA 142 137 137 141 139  K 4.0 3.8 3.7 3.5 3.5  CL 111 106 104 112* 110  CO2 _1 GLUCOSE 116* 102* 113* 109* 101*  BUN 24* 23*  25* 18 15  CREATININE 1.26* 1.15* 1.07* 1.04* 1.07*  CALCIUM 11.5* 11.2* 11.7* 12.6* 11.3*  MG 1.2* 1.6* 2.0  --  1.4*   Liver Function Tests:  Recent Labs Lab 10/23/16 0519  AST 14*  ALT 8*  ALKPHOS 110  BILITOT 0.7  PROT 5.7*  ALBUMIN 3.2*   No results for input(s): LIPASE, AMYLASE in the last 168 hours. No results for input(s): AMMONIA in the last 168 hours. CBC:  Recent Labs Lab 10/23/16 0519 10/23/16 2038 10/24/16 0537 10/25/16 0622 10/26/16 0610  WBC 8.6  --  9.4 9.4 8.0  NEUTROABS 3.0  --   --   --   --   HGB 6.5* 8.4* 7.9* 8.0* 7.6*  HCT 18.8* 25.1* 23.1* 23.9* 22.3*  MCV 101.6*  --  92.0 93.7 94.1  PLT 195  --  192 197 187   Cardiac Enzymes: No results for input(s): CKTOTAL, CKMB, CKMBINDEX, TROPONINI in the last 168 hours. BNP: Invalid input(s): POCBNP CBG:  Recent Labs Lab 10/22/16 0755 10/23/16 0813 10/24/16 0752 10/25/16 0802 10/26/16 0755  GLUCAP 117* 111* 125* 104* 118*   D-Dimer No results for input(s): DDIMER  in the last 72 hours. Hgb A1c No results for input(s): HGBA1C in the last 72 hours. Lipid Profile  Recent Labs  10/26/16 0610  CHOL 95  HDL 28*  LDLCALC 43  TRIG 118  CHOLHDL 3.4   Thyroid function studies No results for input(s): TSH, T4TOTAL, T3FREE, THYROIDAB in the last 72 hours.  Invalid input(s): FREET3 Anemia work up No results for input(s): VITAMINB12, FOLATE, FERRITIN, TIBC, IRON, RETICCTPCT in the last 72 hours. Urinalysis    Component Value Date/Time   COLORURINE STRAW (A) 10/19/2016 0755   APPEARANCEUR CLEAR 10/19/2016 0755   LABSPEC 1.008 10/19/2016 0755   PHURINE 7.0 10/19/2016 0755   GLUCOSEU NEGATIVE 10/19/2016 0755   HGBUR NEGATIVE 10/19/2016 0755   BILIRUBINUR NEGATIVE 10/19/2016 0755   KETONESUR NEGATIVE 10/19/2016 0755   PROTEINUR NEGATIVE 10/19/2016 0755   UROBILINOGEN 0.2 10/18/2007 0945   NITRITE NEGATIVE 10/19/2016 0755   LEUKOCYTESUR NEGATIVE 10/19/2016 0755   Sepsis Labs Invalid  input(s): PROCALCITONIN,  WBC,  LACTICIDVEN Microbiology Recent Results (from the past 240 hour(s))  Urine culture     Status: Abnormal   Collection Time: 10/19/16  7:55 AM  Result Value Ref Range Status   Specimen Description URINE, CLEAN CATCH  Final   Special Requests NONE  Final   Culture MULTIPLE SPECIES PRESENT, SUGGEST RECOLLECTION (A)  Final   Report Status 10/20/2016 FINAL  Final   Time coordinating discharge: Over 30 minutes  SIGNED:  Kerney Elbe, DO [facility] 10/26/2016, 11:20 AM Pager [phone]  If 7PM-7AM, please contact night-coverage [email or web address] Password [email or web address]

## 2016-10-26 NOTE — Progress Notes (Signed)
Occupational Therapy Treatment Patient Details Name: Kristin [name] MRN: [id number] DOB: [birth year] Today's Date: 10/26/2016    History of present illness Kristin [name] is a 83 y.o. female with medical history significant of chronic back pain, hypertension, hyperlipidemia, vertigo, anemia, chronic kidney disease-stage III, who presents with intractable back pain--> UTI/sepsis; MRI-Lumbar showed multilevel degenerative disc disease, but no spinal cord compression  3/11 -pt has an order for a clavicle strap.  3/13 spoke with MD regarding clavicle strap . Md wrote order for sling for LUE.  OT called ortho tech this day regarding order.                  OT comments  Pt continues with significant debilitation and fatigue only tolerating small amounts of activity at a time.  Pt now with sling to LUE with no pushing restrictions.  PT contacted re: looking at other options for assistive device for ambulation.  Talked to family at length about considering SNF as this pt will be a lot to handle at home and requires a lot of encouragement to participate.  Family is set on taking pt home.  Pt will need HHOT and would [facility]  Pt currently only tolerating small amounts of activity at a time.  Will continue to follow.  Follow Up Recommendations  Other (comment) (SNF would be better option but family wants to take pt home.)    Equipment Recommendations       Recommendations for Other Services      Precautions / Restrictions Precautions Precautions: Fall Precaution Comments: pt now has order for LUE sling per Dr Erlinda Hong for LUE.  MD doesnt want pt pushing with LUE and pt was doing this with clavicle strap. Required Braces or Orthoses: Sling Restrictions Weight Bearing Restrictions: Yes LUE Weight Bearing: Non weight bearing Other Position/Activity Restrictions: dr. has limited pushing and pulling with LUE due to clavicle fx that is not healing.  Pt has difficult time following through on this.   Treating pt as NWB seems to work better.       Mobility Bed Mobility Overal bed mobility: Needs Assistance Bed Mobility: Supine to Sit     Supine to sit: Min assist;HOB elevated     General bed mobility comments: increased time and much encouragement for pt to do for herself.  Donn sling before getting pt up to remind her to not use the LUE to push and pull.  Transfers Overall transfer level: Needs assistance Equipment used: 1 person hand held assist Transfers: Sit to/from Stand Sit to Stand: Min assist Stand pivot transfers: Min assist       General transfer comment: Pt attempts to use LUE to push and pull almost all the time despite cues and despite wearing the sling. Spoke to daughter at length about restricting the use of this LUE and reminding pt about it.  Spoke to PT about reevaluating assitive device since she is not supposed to be using this LUE and it is not slinged.    Balance Overall balance assessment: Needs assistance Sitting-balance support: Bilateral upper extremity supported;Feet supported Sitting balance-Leahy Scale: Fair Sitting balance - Comments: reliant on UE support   Standing balance support: Single extremity supported Standing balance-Leahy Scale: Poor Standing balance comment: Pt must have outside assist to remain standing.                   ADL Overall ADL's : Needs assistance/impaired Eating/Feeding: Set up;Sitting   Grooming: Minimal assistance;Standing Grooming  Details (indicate cue type and reason): Pt fatigues very quickly. Upper Body Bathing: Set up;Supervision/ safety;Sitting   Lower Body Bathing: Maximal assistance;Sit to/from stand Lower Body Bathing Details (indicate cue type and reason): Pt unable to reach LEs and would benefit from adaptive equipment. Upper Body Dressing : Moderate assistance;Sitting   Lower Body Dressing: Maximal assistance;Sit to/from stand Lower Body Dressing Details (indicate cue type and reason): Pt  may benefit from reacher to assist with dressing.   Toilet Transfer: Minimal assistance;RW;Cueing for sequencing;BSC;Cueing for safety Toilet Transfer Details (indicate cue type and reason): walked to bathroom with hand held assist. Toileting- Clothing Manipulation and Hygiene: Min guard;Sitting/lateral lean Toileting - Clothing Manipulation Details (indicate cue type and reason): Pt did not stand to clean self.  Was able to clean self without assist in sitting.     Functional mobility during ADLs: Moderate assistance (hand held assist due to new sling) General ADL Comments: Pt very debilitated.  Familiy wants to take pt home instead of SNF.  Did explain to family that pt will be a lot to handle at home but they feel she needs to go home b/c of a bad experience at SNF recently      [place]   Behavior During Therapy: [facility] for tasks assessed/performed Overall Cognitive Status: Within Functional Limits for tasks assessed                  General Comments: Pt follows commands and seems to understand all questions.  Pt is very debilitated, very slow moving and requires max encouragement to participate.  Difficult to discern if this is slow processing or if pt is generally slow moving (which family thinks is the case)      Exercises     Shoulder Instructions       General Comments      Pertinent Vitals/ Pain       Pain Assessment: Faces Faces Pain Scale: Hurts even more Pain Location: back Pain Descriptors / Indicators: Aching;Guarding;Discomfort;Grimacing Pain Intervention(s): Limited activity within patient's tolerance;Monitored during session;Repositioned  Home Living                                          Prior Functioning/Environment              Frequency  Min 2X/week        Progress Toward Goals  OT Goals(current goals can now be found in the care plan section)   Progress towards OT goals: Progressing toward goals  Acute Rehab OT Goals Patient Stated Goal: home OT Goal Formulation: With patient Time For Goal Achievement: 11/09/16 Potential to Achieve Goals: Good ADL Goals Pt Will Perform Grooming: with supervision;with set-up;sitting;standing Pt Will Perform Lower Body Bathing: with min assist;sit to/from stand Pt Will Perform Lower Body Dressing: with min assist;sit to/from stand Pt Will Transfer to Toilet: regular height toilet;ambulating;bedside commode;with min assist Pt Will Perform Toileting - Clothing Manipulation and hygiene: with supervision;sit to/from stand Pt/caregiver will Perform Home Exercise Program: With written HEP provided Additional ADL Goal #1: Pt will be min gaurd A in and OOB for basic ADLs  Plan Discharge plan remains appropriate    Co-evaluation  End of Session    OT Visit Diagnosis: Unsteadiness on feet (R26.81);Pain;Muscle weakness (generalized) (M62.81) Pain - part of body:  (back)   Activity Tolerance Patient limited by fatigue   Patient Left in chair;with call bell/phone within reach;with chair alarm set;with family/visitor present   Nurse Communication Mobility status        Time: 1018-1100 OT Time Calculation (min): 42 min  Charges: OT General Charges $OT Visit: 1 Procedure OT Treatments $Self Care/Home Management : 38-52 mins  Jinger Neighbors, OTR/L [phone]   Glenford Peers 10/26/2016, 11:28 AM

## 2016-10-26 NOTE — Progress Notes (Signed)
Chaplain stopped in to visit with current patient and family.  Wilmoth is much improved today from our previous conversations.  Chaplain appreciates all of the care that has been extended to this family.    Lucita Ferrara, the daughter is at bedside, and Brigitte is in the chair.  Lunch has arrived.  Family is happy to be hopefully going home today.    10/26/16 1149  Clinical Encounter Type  Visited With Patient;Family  Visit Type Follow-up;Spiritual support;Social support    [employer] Chaplain Resident

## 2016-10-28 NOTE — Progress Notes (Signed)
[place]  [phone] [phone]  Clinic Follow Up Note   Patient Care Team: Kristin Shanks, MD as PCP - General (Family Medicine) 11/01/2016  CHIEF COMPLAINTS/PURPOSE OF CONSULTATION:  Newly diagnosed Multiple Myeloma    HISTORY OF PRESENTING ILLNESS (10/20/2016):  83 year old African-American female, with past medical history of hypertension, chronic back pain, see Kristin [name] stage III, who presents with altered mental status. She was admitted for hypercalcemia. I was called for consult to rule out multiple myeloma.   The patient is joined by family today. They help to report the patient's history. The patient has been lethargic and confused for the past week. She has issues with mobility at this time. She is constipated.   The family reports the patient fell last October, and her PCP suspected she broke her back. The patient received a cortisone injection in January, which was followed by onset of pain and decreased mobility. The patient was subsequently admitted [name], where the family reports they discovered she was being given someone else's medicine. This led to the patient's confused mental status and difficulty with ambulation. The patient fell again while in [name], and she was brought back to home by her family. She has 24/7 care at home. In the ED, she was found to have calcium more than 15, she was admitted for further management. She received calcitonin, her calcium came down to 12.7 today. Her SPEP was positive for M protein-0.4 g/dl, possible represent a monoclonal gammopathy. Her 24-hour urine light chains are pending. Her PTH was normal at 20, PTH-rp is pending.   CURRENT THERAPY:  1. Pending RD (Revlimid 10 mg daily, 3 weeks on, one week off, and weekly dexamethasone 36m) 2. Aredia [id number] weeks, started on 10/24/2016 for hypercalcemia   INTERIM HISTORY: [name]89.o. female is here for follow up  after being admitted to the hospital from 10/18/2016 - 10/26/2016. She has been doing well since she got home from the hospital. She has not started PT yet. Pt says she feels too tired and weak to do PT. She is trying to eat, but doesn't have much of an appetite. She does drink Ensure and Boost as often as possible. Her main concern is her back and shoulder pain. She takes Tramadol to relieve this. She has vertigo. She has not been very active and uses a wheelchair most of the time. She can take a few steps to the bathroom or to her bed. Denies confusion, constipation, diarrhea, or any other concerns.   MEDICAL HISTORY:  Past Medical History:  Diagnosis Date  . Allergy   . Arthritis   . Chronic kidney disease (CKD), stage III (moderate)    /nArchie Endo/04/2017  . Depression    Risperdal "twice/day" (09/23/2016)  . Glaucoma   . Hiatal hernia   . Hypertension   . Intractable back pain    /notes 09/23/2016  . Obesity   . Osteoporosis    vertebral fracture in 2012  . Upper GI bleed 04/2004   secondary to Mallory-Weiss tear/notes 12/28/2010  . Vertigo     SURGICAL HISTORY: Past Surgical History:  Procedure Laterality Date  . ABDOMINAL HYSTERECTOMY  1974   partial  . APPENDECTOMY     as a child/notes 12/28/2010  . BACK SURGERY    . BREAST BIOPSY Left 1999   /nArchie Endo/15/2012  . CATARACT EXTRACTION, BILATERAL Bilateral 2000s  . ESOPHAGOGASTRODUODENOSCOPY  04/2004   /nArchie Endo/15/2012  . KYPHOPLASTY  05/2011   T10/notes 05/25/2011  . LAPAROSCOPIC CHOLECYSTECTOMY  10/2007   Archie Endo 12/14/2010    SOCIAL HISTORY: Social History   Social History  . Marital status: Widowed    Spouse name: N/A  . Number of children: N/A  . Years of education: N/A   Occupational History  . Not on file.   Social History Main Topics  . Smoking status: Never Smoker  . Smokeless tobacco: Never Used  . Alcohol use Yes     Comment: 09/23/2016 "glass of wine a few times/year"  . Drug use: No  . Sexual activity: No     Other Topics Concern  . Not on file   Social History Narrative  . No narrative on file    FAMILY HISTORY: Family History  Problem Relation Age of Onset  . Hypertension Mother   . Osteoporosis Mother   . CVA Mother   . Cancer Neg Hx     ALLERGIES:  is allergic to alendronate; penicillins; and sulfonamide derivatives.  MEDICATIONS:  Current Outpatient Prescriptions  Medication Sig Dispense Refill  . amLODipine (NORVASC) 5 MG tablet Take 1 tablet (5 mg total) by mouth daily. 30 tablet 0  . feeding supplement, ENSURE ENLIVE, (ENSURE ENLIVE) LIQD Take 237 mLs by mouth 2 (two) times daily between meals. 237 mL 12  . magnesium oxide (MAG-OX) 400 (241.3 Mg) MG tablet Take 1 tablet (400 mg total) by mouth daily. 7 tablet 0  . meclizine (ANTIVERT) 25 MG tablet TAKE ONE TABLET BY MOUTH THREE TIMES DAILY AS NEEDED (Patient taking differently: Take 25 mg by mouth 3 (three) times daily as needed for dizziness. TAKE ONE TABLET BY MOUTH THREE TIMES DAILY AS NEEDED) 90 tablet 0  . nitrofurantoin (MACRODANTIN) 50 MG capsule     . polyethylene glycol powder (GLYCOLAX/MIRALAX) powder Take 17 g by mouth daily.    . RESTASIS 0.05 % ophthalmic emulsion Place 1 drop into both eyes daily.     . traMADol (ULTRAM) 50 MG tablet Take 50 mg by mouth every 6 (six) hours as needed.    . bisacodyl (DULCOLAX) 10 MG suppository Place 1 suppository (10 mg total) rectally daily as needed for moderate constipation. (Patient not taking: Reported on 10/18/2016) 12 suppository 0  . dexamethasone (DECADRON) 4 MG tablet Take 1 tablet (4 mg total) by mouth once a week. (Patient not taking: Reported on 11/01/2016) 30 tablet 1  . [START ON 11/25/2016] ibandronate (BONIVA) 150 MG tablet Take 1 tablet (150 mg total) by mouth every 30 (thirty) days. Take in the morning with a full glass of water, on an empty stomach, and do not take anything else by mouth or lie down for the next 30 min. (Patient not taking: Reported on 11/01/2016) 3  tablet 0  . lenalidomide (REVLIMID) 10 MG capsule Take 1 capsule (10 mg total) by mouth daily. (Patient not taking: Reported on 11/01/2016) 21 capsule 0   No current facility-administered medications for this visit.     REVIEW OF SYSTEMS:   Constitutional: Denies fevers, chills or abnormal night sweats (+) fatigue (+) weakness (+) loss of appetite  Eyes: Denies blurriness of vision, double vision or watery eyes Ears, nose, mouth, throat, and face: Denies mucositis or sore throat (+) vertigo Respiratory: Denies cough, dyspnea or wheezes Cardiovascular: Denies palpitation, chest discomfort or lower extremity swelling Gastrointestinal:  Denies nausea, heartburn or change in bowel habits Skin: Denies abnormal skin rashes Lymphatics: Denies new lymphadenopathy or easy bruising Neurological:Denies numbness, tingling or new weaknesses  Behavioral/Psych: Mood is stable, no new changes  Musculoskeletal: (+) back and shoulder pain All other systems were reviewed with the patient and are negative.  PHYSICAL EXAMINATION: ECOG PERFORMANCE STATUS: 3  Vitals:   11/01/16 1209  BP: (!) 141/76  Pulse: (!) 111  Resp: 18  Temp: 99 F (37.2 C)   Filed Weights   11/01/16 1209  Weight: 142 lb (64.4 kg)   GENERAL:alert, no distress and comfortable, sitting in wheelchair  SKIN: skin color, texture, turgor are normal, no rashes or significant lesions EYES: normal, conjunctiva are pink and non-injected, sclera clear OROPHARYNX:no exudate, no erythema and lips, buccal mucosa, and tongue normal  NECK: supple, thyroid normal size, non-tender, without nodularity LYMPH:  no palpable lymphadenopathy in the cervical, axillary or inguinal LUNGS: clear to auscultation and percussion with normal breathing effort HEART: regular rate & rhythm and no murmurs and no lower extremity edema ABDOMEN:abdomen soft, non-tender and normal bowel sounds Musculoskeletal:no cyanosis of digits and no clubbing  PSYCH: alert &  oriented x 3 with fluent speech NEURO: no focal motor/sensory deficits  LABORATORY DATA:  I have reviewed the data as listed CBC Latest Ref Rng & Units 11/01/2016 10/26/2016 10/25/2016  WBC 3.9 - 10.3 10e3/uL 8.6 8.0 9.4  Hemoglobin 11.6 - 15.9 g/dL 9.4(L) 7.6(L) 8.0(L)  Hematocrit 34.8 - 46.6 % 27.6(L) 22.3(L) 23.9(L)  Platelets 145 - 400 10e3/uL 232 187 197   CMP Latest Ref Rng & Units 11/01/2016 10/26/2016 10/25/2016  Glucose 70 - 140 mg/dl 133 101(H) 109(H)  BUN 7.0 - 26.0 mg/dL 14.4 15 18   Creatinine 0.6 - 1.1 mg/dL 1.1 1.07(H) 1.04(H)  Sodium 136 - 145 mEq/L 142 139 141  Potassium 3.5 - 5.1 mEq/L 4.3 3.5 3.5  Chloride 101 - 111 mmol/L - 110 112(H)  CO2 22 - 29 mEq/L 25 23 24   Calcium 8.4 - 10.4 mg/dL 10.8(H) 11.3(H) 12.6(H)  Total Protein 6.4 - 8.3 g/dL 6.5 - -  Total Bilirubin 0.20 - 1.20 mg/dL 0.47 - -  Alkaline Phos 40 - 150 U/L 175(H) - -  AST 5 - 34 U/L 16 - -  ALT 0 - 55 U/L 9 - -    MM LAB: M-protein (g/dl) 10/19/2016: 0.4  Serum kappa, lamda light chains and ratio (mg/L) 10/21/2016: 14.8, 5362, 0.00  24 hour urine (total protein, free lambda, ration) 10/21/2016: 1463, 1570, 0.06  PATHOLOGY: Interpretation 10/25/2016 Bone Marrow Flow Cytometry - NO MONOCLONAL B-CELL OR PHENOTYPICALLY ABERRANT T-CELL POPULATION IDENTIFIED.  Diagnosis 10/25/2016 Bone Marrow, Aspirate,Biopsy, and Clot - PLASMA CELL MYELOMA. - SEE COMMENT. PERIPHERAL BLOOD: - NORMOCYTIC ANEMIA. Diagnosis Note Overall, the marrow is hypercellular with increased lambda-restricted plasma cells (62% aspirate, 90% CD138). There is partial expression of CD20, but no monoclonal B-cell population is detected by flow cytometry.  RADIOGRAPHIC STUDIES: I have personally reviewed the radiological images as listed and agreed with the findings in the report. Dg Chest 2 View  Result Date: 10/18/2016 CLINICAL DATA:  Initial evaluation for acute altered mental status, recent fall. EXAM: CHEST  2 VIEW COMPARISON:   Prior radiograph from 10/02/2016. FINDINGS: Moderate cardiomegaly, stable. Mediastinal silhouette within normal limits. Aortic atherosclerosis with tortuosity the intrathoracic aorta. Lungs are hypoinflated. Diffusely increased vascular congestion with interstitial prominence, suggesting pulmonary interstitial congestion/ edema. Increased opacity at the right lung base may reflect atelectasis/ edema or infiltrate. No other focal infiltrates. No pneumothorax. No acute osseous abnormality. Patient status post vertebral augmentation at T9. Degenerative changes noted about the shoulders bilaterally. IMPRESSION: 1. Stable  cardiomegaly with diffuse increased vascular congestion and interstitial prominence, suggesting pulmonary interstitial edema. 2. More confluent opacity at the right lung base. While this finding may reflect atelectasis and/or edema/congestion, possible infiltrate could also be considered. 3. Aortic atherosclerosis. Electronically Signed   By: Jeannine Boga M.D.   On: 10/18/2016 22:02   Dg Abd 1 View  Result Date: 10/22/2016 CLINICAL DATA:  N/V, family report patient started to vomit a few minutes after eating, she seems to have trouble with solid, not problem with meds or liquid. H/o CKD stage III, Hiatal hernia, upper GI bleed in 2005. Surgical h/o cholecystectomy in 2009. EXAM: ABDOMEN - 1 VIEW COMPARISON:  None. FINDINGS: Nonobstructive bowel gas pattern. No pneumoperitoneum, pneumatosis or portal venous gas. Limited visualization of lower thorax is normal. Moderate multilevel thoracolumbar spine degenerative change, incompletely evaluated. Post lower thoracic vertebral body augmentation, incompletely evaluated. Post cholecystectomy. IMPRESSION: Nonobstructive bowel gas pattern. Electronically Signed   By: Sandi Mariscal M.D.   On: 10/22/2016 14:31   Ct Soft Tissue Neck Wo Contrast  Result Date: 10/19/2016 CLINICAL DATA:  Fall 10/02/2016. Left shoulder pain and swelling. Dysphagia. EXAM:  CT NECK WITHOUT CONTRAST TECHNIQUE: Multidetector CT imaging of the neck was performed following the standard protocol without intravenous contrast. COMPARISON:  CT cervical spine 10/02/2016 FINDINGS: Pharynx and larynx: Negative Salivary glands: Negative Thyroid: Negative Lymph nodes: No pathologic lymph nodes in the neck. Vascular: Limited evaluation due to lack of intravenous contrast. Calcification in the left internal carotid artery. Limited intracranial: Negative Visualized orbits: Negative Mastoids and visualized paranasal sinuses: Mild mucosal edema left maxillary sinus. Otherwise negative. Skeleton: Fracture of the left medial clavicle with mild displacement. Surrounding soft tissue calcification most compatible with calcifying hematoma. No bony union. There is mild periosteal reaction. Reconstructed sagittal images demonstrate moderate fractures of T2 and T4. These show sclerosis and could be healing fractures. These were not scanned on the prior cervical spine CT. There is moderate disc degeneration and spurring throughout the cervical spine. Upper chest: Chest CT from today reported separately Other: None IMPRESSION: Subacute fracture left medial clavicle. Calcifying hematoma around the fracture site. This accounts for the palpable abnormality. Moderately severe compression fractures T2 and T4 which may be healing fractures. No mass or soft tissue edema in the neck. Electronically Signed   By: Franchot Gallo M.D.   On: 10/19/2016 15:57   Ct Chest Wo Contrast  Result Date: 10/19/2016 CLINICAL DATA:  Upon assessing pt, a hard, bony prominence noted at along the left throat/clavicle area. EXAM: CT CHEST WITHOUT CONTRAST TECHNIQUE: Multidetector CT imaging of the chest was performed following the standard protocol without IV contrast. COMPARISON:  None. FINDINGS: Bones/Joint/Cartilage Healing nondisplaced fracture of the left clavicular head with severe surrounding callus formation and soft tissue  swelling likely accounting for the palpable abnormality. No sternoclavicular dislocation. Mild osteoarthritis of the right sternoclavicular joint. There is suggestion of a lytic lesion at the fracture site which may be secondary to metastatic disease or multiple myeloma versus osteolysis secondary to the fracture. Mild arthropathy of bilateral acromioclavicular joints. No aggressive lytic or sclerotic osseous lesion. Degenerative disc disease with disc height loss C3-4, C5-6 and C6-7. Mild broad-based disc bulge at C3-4 with bilateral mild facet arthropathy. Bilateral uncovertebral degenerative changes at C4-5 and C5-6 with bilateral facet arthropathy and foraminal stenosis. Broad-based disc osteophyte complex at C6-7 with bilateral facet arthropathy. Bilateral facet arthropathy at C7-T1. T2 vertebral body compression fracture likely acute. Lucent lesion in the left lateral aspect of the  T2 vertebral body which may reflect metastatic disease versus multiple myeloma. Chronic T4 vertebral body compression fracture. Ligaments Ligaments are suboptimally evaluated by CT. Muscles and Tendons Muscles are normal.  No muscle atrophy. Soft tissue No fluid collection or hematoma. No soft tissue mass. Small right pleural effusion. IMPRESSION: 1. Healing nondisplaced fracture of the left clavicular head with severe surrounding callus formation and soft tissue swelling likely accounting for the palpable abnormality. There is suggestion of a lytic lesion at the fracture site which may be secondary to metastatic disease or multiple myeloma versus osteolysis secondary to the fracture. 2. Acute T2 vertebral body compression fracture with mild height loss. No retropulsion into the spinal canal. Lucent lesion in the left lateral aspect of the T2 vertebral body which may reflect metastatic disease versus multiple myeloma. Electronically Signed   By: Kathreen Devoid   On: 10/19/2016 15:56   Dg Esophagus  Result Date: 10/24/2016 CLINICAL  DATA:  Difficulty swallowing pre nausea vomiting EXAM: ESOPHOGRAM/BARIUM SWALLOW TECHNIQUE: Single contrast examination was performed using  thin. FLUOROSCOPY TIME:  Fluoroscopy Time:  2 minutes 30 seconds Radiation Exposure Index (if provided by the fluoroscopic device): 25.0 mGy Number of Acquired Spot Images: 10 COMPARISON:  CT 06/20/2016, 10/19/2016 FINDINGS: Limited exam due to patient immobility Esophagitis is mildly patulous. Contrast flows through the esophagus and GE junction without high-grade obstruction. There is poor esophageal motility with tertiary contractions. Hiatal hernia noted. 13 mm tablet was administered. Barium tablet traveled into the hiatal hernia sac. Potential gastric diverticulum retains barium tablet. IMPRESSION: 1. Esophageal dysmotility. 2. Hiatal hernia with potential gastric diverticulum. Hiatal hernia versus diverticulum retains the barium tablet. 3. No high-grade obstruction. Electronically Signed   By: Suzy Bouchard M.D.   On: 10/24/2016 13:08   Ct Biopsy  Result Date: 10/25/2016 INDICATION: Hypercalcemia and monoclonal gammopathy of uncertain significance. EXAM: CT GUIDED BONE MARROW ASPIRATION AND CORE BIOPSY Interventional Radiologist:  Criselda Peaches, MD MEDICATIONS: None. ANESTHESIA/SEDATION: Moderate (conscious) sedation was employed during this procedure. A total of 1 milligrams versed and 50 micrograms fentanyl were administered intravenously. The patient's level of consciousness and vital signs were monitored continuously by radiology nursing throughout the procedure under my direct supervision. Total monitored sedation time: 13 minutes FLUOROSCOPY TIME:  Fluoroscopy Time: 0 minutes 0 seconds (0 mGy). COMPLICATIONS: None immediate. Estimated blood loss: <25 mL PROCEDURE: Informed written consent was obtained from the patient after a thorough discussion of the procedural risks, benefits and alternatives. All questions were addressed. Maximal Sterile Barrier  Technique was utilized including caps, mask, sterile gowns, sterile gloves, sterile drape, hand hygiene and skin antiseptic. A timeout was performed prior to the initiation of the procedure. The patient was positioned prone and non-contrast localization CT was performed of the pelvis to demonstrate the iliac marrow spaces. Maximal barrier sterile technique utilized including caps, mask, sterile gowns, sterile gloves, large sterile drape, hand hygiene, and betadine prep. Under sterile conditions and local anesthesia, an 11 gauge coaxial bone biopsy needle was advanced into the left iliac marrow space. Needle position was confirmed with CT imaging. Initially, bone marrow aspiration was performed. Next, the 11 gauge outer cannula was utilized to obtain a left iliac bone marrow core biopsy. Needle was removed. Hemostasis was obtained with compression. The patient tolerated the procedure well. Samples were prepared with the cytotechnologist. IMPRESSION: Technically successful CT-guided bone marrow biopsy of the left iliac bone. Signed, Criselda Peaches, MD [facility] Rad[facility] Electronically Signed   By: Myrle Sheng  Laurence Ferrari M.D.   On: 10/25/2016 10:53   Dg Chest Port 1 View  Result Date: 10/22/2016 CLINICAL DATA:  Cough EXAM: PORTABLE CHEST 1 VIEW COMPARISON:  10/19/2016 FINDINGS: Cardiac shadow is enlarged. Aortic calcifications are again seen. Changes of prior vertebral augmentation are noted. The lungs are clear with the exception of nodular appearing density in the left upper lobe which corresponds to some pleural-based infiltrate IMPRESSION: Stable changes when compared with recent CT. No acute abnormality noted. Electronically Signed   By: Inez Catalina M.D.   On: 10/22/2016 14:45   Dg Shoulder Left  Result Date: 10/18/2016 CLINICAL DATA:  Initial evaluation for acute pain, status post recent fall. EXAM: LEFT SHOULDER - 2+ VIEW COMPARISON:  None available.  FINDINGS: No acute fracture or dislocation. Humeral head in normal alignment with the glenoid. Degenerative osteophytic spurring present at the inferior aspect of the humeral head. AC joint is approximated. No periarticular calcification. Congestive changes noted within the partially visualized left lung. IMPRESSION: 1. No acute fracture or dislocation about the left shoulder. 2. Degenerative osteoarthrosis about the left glenohumeral joint. Electronically Signed   By: Jeannine Boga M.D.   On: 10/18/2016 22:04   Dg Bone Survey Met  Result Date: 10/21/2016 CLINICAL DATA:  Multiple myeloma. EXAM: METASTATIC BONE SURVEY COMPARISON:  Radiographs of April 25, 2016. MRI of March 18, 2011. FINDINGS: There is interval development of mild compression deformity of L3 vertebral body concerning for fracture of indeterminate age. Status post kyphoplasty of T9 vertebral body. Interval development of moderate compression deformity involving T4 vertebral body concerning for pathologic fracture. Possible 2 small lucent lesions are seen in the skull posteriorly. IMPRESSION: Possible 2 small lucent lesions seen posteriorly in the skull which may represent multiple myeloma. Interval development of moderate compression deformity of T4 vertebral body concerning for possible pathologic fracture given history of multiple myeloma. Also noted is interval development of mild compression deformity of L3 vertebral body concerning for fracture of indeterminate age ; MRI may be performed further evaluation. Electronically Signed   By: Marijo Conception, M.D.   On: 10/21/2016 10:50   Ct Bone Marrow Biopsy & Aspiration  Result Date: 10/25/2016 INDICATION: Hypercalcemia and monoclonal gammopathy of uncertain significance. EXAM: CT GUIDED BONE MARROW ASPIRATION AND CORE BIOPSY Interventional Radiologist:  Criselda Peaches, MD MEDICATIONS: None. ANESTHESIA/SEDATION: Moderate (conscious) sedation was employed during this procedure. A  total of 1 milligrams versed and 50 micrograms fentanyl were administered intravenously. The patient's level of consciousness and vital signs were monitored continuously by radiology nursing throughout the procedure under my direct supervision. Total monitored sedation time: 13 minutes FLUOROSCOPY TIME:  Fluoroscopy Time: 0 minutes 0 seconds (0 mGy). COMPLICATIONS: None immediate. Estimated blood loss: <25 mL PROCEDURE: Informed written consent was obtained from the patient after a thorough discussion of the procedural risks, benefits and alternatives. All questions were addressed. Maximal Sterile Barrier Technique was utilized including caps, mask, sterile gowns, sterile gloves, sterile drape, hand hygiene and skin antiseptic. A timeout was performed prior to the initiation of the procedure. The patient was positioned prone and non-contrast localization CT was performed of the pelvis to demonstrate the iliac marrow spaces. Maximal barrier sterile technique utilized including caps, mask, sterile gowns, sterile gloves, large sterile drape, hand hygiene, and betadine prep. Under sterile conditions and local anesthesia, an 11 gauge coaxial bone biopsy needle was advanced into the left iliac marrow space. Needle position was confirmed with CT imaging. Initially, bone marrow aspiration was performed. Next, the 11  gauge outer cannula was utilized to obtain a left iliac bone marrow core biopsy. Needle was removed. Hemostasis was obtained with compression. The patient tolerated the procedure well. Samples were prepared with the cytotechnologist. IMPRESSION: Technically successful CT-guided bone marrow biopsy of the left iliac bone. Signed, Criselda Peaches, MD [facility] Electronically Signed   By: Jacqulynn Cadet M.D.   On: 10/25/2016 10:53   ASSESSMENT & PLAN:  83 y.o.woman with HTN and CKD, presented with hypercalcemia, worsening anemia since 06/2016,  s/p multiple fall, multiple hospitalization recently for back pain , CT scan showedT2 vertebral body compression fracture with mild height loss.  1. Multiple Myeloma, lambda light chain disease  -I reviewed the bone marrow biopsy results with the patient and her family, along with the scan findings  -she presents with hypercalcemia, anemia, mild renal failure and multiple fractures after fall -Her cytogenetics is still pending, which will  determine her disease risk  -We discussed treatment options.  due to advanced age, multiple comorbidities, very limited performance status, I do not believe she is a candidate for a bone marrow transplant -if she has standard risk disease, I recommend her dexamethasone and Revlimid. Start on dexamethasone 78m weekly and Revlimid 163mdaily; 3 weeks on, 1 week off, at adjusted for her renal function. If she tolerates well and renal function improves, I will increase her Revlimid dose on subsequent cycles.  -Benefit and the potential side effects from Revlimid and steroids, especially immunosuppression, risk of infection, anemia, some cytopenia, need for blood transfusion, neuropathy, secondary malignancy, osteoporosis, were discussed with patient and her family members in great details. She agrees to proceed -The goal of therapy is palliative, for disease control, improve her quality of life and prolong life. -If she does have high risk disease, I may consider adding Velcade injection.  -She is very weak. I have referred her to home PT.  -Labs reviewed. Cytogenetics test pending   2. Anemia and Hypercalcemia  -secondary to MM. She received blood transfusion in hospital -She has received one dose of Aredia, hypercalcemia has nearly resolved now. We'll continue every 4 weeks for multiple myeloma, or more frequent if her hypercalcemia gets worse   3. Chronic CKD -improved -Avoid nephrotoxic medicine or contrast   4. Severe back pain -T2 compression  fracture -Takes Tramadol as needed   5. HTN -controlled with medication -managed by PCP  6. Osteoporosis and fractures -Takes Boniva, I will switch it to Aredia   7. Goal of care discussion  -We again discussed the incurable nature of her MM, and the overall poor prognosis, especially if she does not have good response to chemotherapy or progress on chemo -The patient understands the goal of care is palliative. -I recommend DNR/DNI, she will think about it   Plan -Referral to home PT. -Start on dexamethasone (2087m4mg61mtablets, once a week) this week and Revlimid 10mg74mly; 3 weeks on, 1 week off, as soon as she receives the meds, prescription was sent out today  -Return for f/u on April 5 or 6.    All questions were answered. The patient knows to call the clinic with any problems, questions or concerns.  I spent 30 minutes counseling the patient face to face. The total time spent in the appointment was 40 minutes and more than 50% was on counseling.  This document serves as a record of services personally performed by Lacresha Fusilier FTruitt Merle It was created on her behalf by JordaMartinique  Myriam Jacobson, a trained medical scribe. The creation of this record is based on the scribe's personal observations and the provider's statements to them. This document has been checked and approved by the attending provider.  I have reviewed the above documentation for accuracy and completeness and I agree with the above.   Truitt Merle, MD 11/01/2016

## 2016-10-31 DIAGNOSIS — C9000 Multiple myeloma not having achieved remission: Secondary | ICD-10-CM | POA: Insufficient documentation

## 2016-11-01 ENCOUNTER — Encounter: Payer: Self-pay | Admitting: Hematology

## 2016-11-01 ENCOUNTER — Ambulatory Visit (HOSPITAL_BASED_OUTPATIENT_CLINIC_OR_DEPARTMENT_OTHER): Payer: Medicare Other | Admitting: Hematology

## 2016-11-01 ENCOUNTER — Telehealth: Payer: Self-pay | Admitting: Hematology

## 2016-11-01 ENCOUNTER — Other Ambulatory Visit (HOSPITAL_BASED_OUTPATIENT_CLINIC_OR_DEPARTMENT_OTHER): Payer: Medicare Other

## 2016-11-01 VITALS — BP 141/76 | HR 111 | Temp 99.0°F | Resp 18 | Ht 60.0 in | Wt 142.0 lb

## 2016-11-01 DIAGNOSIS — C9000 Multiple myeloma not having achieved remission: Secondary | ICD-10-CM

## 2016-11-01 DIAGNOSIS — I10 Essential (primary) hypertension: Secondary | ICD-10-CM

## 2016-11-01 DIAGNOSIS — M8000XS Age-related osteoporosis with current pathological fracture, unspecified site, sequela: Secondary | ICD-10-CM | POA: Diagnosis not present

## 2016-11-01 DIAGNOSIS — M549 Dorsalgia, unspecified: Secondary | ICD-10-CM

## 2016-11-01 DIAGNOSIS — M545 Low back pain: Secondary | ICD-10-CM

## 2016-11-01 DIAGNOSIS — Z7189 Other specified counseling: Secondary | ICD-10-CM

## 2016-11-01 DIAGNOSIS — D630 Anemia in neoplastic disease: Secondary | ICD-10-CM

## 2016-11-01 DIAGNOSIS — N189 Chronic kidney disease, unspecified: Secondary | ICD-10-CM

## 2016-11-01 LAB — CBC WITH DIFFERENTIAL/PLATELET
BASO%: 1 % (ref 0.0–2.0)
Basophils Absolute: 0.1 10*3/uL (ref 0.0–0.1)
EOS%: 4.9 % (ref 0.0–7.0)
Eosinophils Absolute: 0.4 10*3/uL (ref 0.0–0.5)
HEMATOCRIT: 27.6 % — AB (ref 34.8–46.6)
HEMOGLOBIN: 9.4 g/dL — AB (ref 11.6–15.9)
LYMPH#: 2.7 10*3/uL (ref 0.9–3.3)
LYMPH%: 31.3 % (ref 14.0–49.7)
MCH: 33.7 pg (ref 25.1–34.0)
MCHC: 33.9 g/dL (ref 31.5–36.0)
MCV: 99.4 fL (ref 79.5–101.0)
MONO#: 1.9 10*3/uL — ABNORMAL HIGH (ref 0.1–0.9)
MONO%: 21.9 % — AB (ref 0.0–14.0)
NEUT#: 3.5 10*3/uL (ref 1.5–6.5)
NEUT%: 40.9 % (ref 38.4–76.8)
Platelets: 232 10*3/uL (ref 145–400)
RBC: 2.78 10*6/uL — ABNORMAL LOW (ref 3.70–5.45)
RDW: 20.8 % — AB (ref 11.2–14.5)
WBC: 8.6 10*3/uL (ref 3.9–10.3)

## 2016-11-01 LAB — COMPREHENSIVE METABOLIC PANEL
ALT: 9 U/L (ref 0–55)
AST: 16 U/L (ref 5–34)
Albumin: 3.7 g/dL (ref 3.5–5.0)
Alkaline Phosphatase: 175 U/L — ABNORMAL HIGH (ref 40–150)
Anion Gap: 11 mEq/L (ref 3–11)
BUN: 14.4 mg/dL (ref 7.0–26.0)
CALCIUM: 10.8 mg/dL — AB (ref 8.4–10.4)
CHLORIDE: 106 meq/L (ref 98–109)
CO2: 25 mEq/L (ref 22–29)
CREATININE: 1.1 mg/dL (ref 0.6–1.1)
EGFR: 56 mL/min/{1.73_m2} — ABNORMAL LOW (ref >90)
GLUCOSE: 133 mg/dL (ref 70–140)
POTASSIUM: 4.3 meq/L (ref 3.5–5.1)
SODIUM: 142 meq/L (ref 136–145)
Total Bilirubin: 0.47 mg/dL (ref 0.20–1.20)
Total Protein: 6.5 g/dL (ref 6.4–8.3)

## 2016-11-01 LAB — TECHNOLOGIST REVIEW

## 2016-11-01 MED ORDER — DEXAMETHASONE 4 MG PO TABS: 4.0000 mg | ORAL_TABLET | ORAL | 1 refills | Status: DC

## 2016-11-01 MED ORDER — LENALIDOMIDE 10 MG PO CAPS: 10.0000 mg | ORAL_CAPSULE | Freq: Every day | ORAL | 0 refills | Status: DC

## 2016-11-01 NOTE — Telephone Encounter (Signed)
Gave patient relative avs report and appointments for April.

## 2016-11-02 LAB — KAPPA/LAMBDA LIGHT CHAINS
IG LAMBDA FREE LIGHT CHAIN: 108.8 mg/L — AB (ref 5.7–26.3)
Ig Kappa Free Light Chain: 14.9 mg/L (ref 3.3–19.4)
Kappa/Lambda FluidC Ratio: 0.14 — ABNORMAL LOW (ref 0.26–1.65)

## 2016-11-02 LAB — TISSUE HYBRIDIZATION (BONE MARROW)-NCBH

## 2016-11-02 LAB — CHROMOSOME ANALYSIS, BONE MARROW

## 2016-11-03 ENCOUNTER — Telehealth: Payer: Self-pay | Admitting: *Deleted

## 2016-11-03 NOTE — Telephone Encounter (Signed)
Faxed information needed for authorization to [employer]:  Last office note.

## 2016-11-03 NOTE — Telephone Encounter (Signed)
Script for Revlimid faxed to [employer] with auth # [id number].

## 2016-11-04 LAB — MULTIPLE MYELOMA PANEL, SERUM
Albumin SerPl Elph-Mcnc: 3.3 g/dL (ref 2.9–4.4)
Albumin/Glob SerPl: 1.2 (ref 0.7–1.7)
Alpha 1: 0.4 g/dL (ref 0.0–0.4)
Alpha2 Glob SerPl Elph-Mcnc: 0.9 g/dL (ref 0.4–1.0)
B-Globulin SerPl Elph-Mcnc: 1.2 g/dL (ref 0.7–1.3)
Gamma Glob SerPl Elph-Mcnc: 0.4 g/dL (ref 0.4–1.8)
Globulin, Total: 2.9 g/dL (ref 2.2–3.9)
IGA/IMMUNOGLOBULIN A, SERUM: 22 mg/dL — AB (ref 64–422)
IGM (IMMUNOGLOBIN M), SRM: 6 mg/dL — AB (ref 26–217)
IgG, Qn, Serum: 293 mg/dL — ABNORMAL LOW (ref 700–1600)
TOTAL PROTEIN: 6.2 g/dL (ref 6.0–8.5)

## 2016-11-07 ENCOUNTER — Encounter: Payer: Self-pay | Admitting: Hematology

## 2016-11-08 ENCOUNTER — Encounter (HOSPITAL_COMMUNITY): Payer: Self-pay

## 2016-11-10 ENCOUNTER — Other Ambulatory Visit: Payer: Self-pay | Admitting: Hematology

## 2016-11-10 DIAGNOSIS — C9000 Multiple myeloma not having achieved remission: Secondary | ICD-10-CM

## 2016-11-10 NOTE — Progress Notes (Signed)
[place]  Telephone:([phone]) [phone] [phone]  Clinic Follow Up Note   Patient Care Team: [name], MD as PCP - General (Family Medicine) 11/17/2016  CHIEF COMPLAINTS:  Follow up Multiple Myeloma      Multiple myeloma ([place])   10/18/2016 - 10/26/2016 Hospital Admission    Pt was admitted for symptomatic hypercalcemia, AKI, bone marrow biopsy done during hospital stay, and she received Aredia       10/19/2016 Tumor Marker    SPEP showed M-protein 0.4g/dl, serum lambda light chain 5362, ratio 0.00      10/25/2016 Bone Marrow Biopsy     Overall, the marrow is hypercellular with increased lambda-restricted plasma cells (62% aspirate, 90% CD138). There is partial expression of CD20, but no monoclonal B-cell population is detected by flow cytometry.       10/25/2016 Initial Diagnosis    Multiple myeloma ([place])      10/25/2016 Miscellaneous    Cytogenetics and FISH showed loss of 13q, which is intermediate risk for MM        HISTORY OF PRESENTING ILLNESS (10/20/2016):  83 year old African-American female, with past medical history of hypertension, chronic back pain, [name] stage III, who presents with altered mental status. She was admitted for hypercalcemia. I was called for consult to rule out multiple myeloma.   The patient is joined by family today. They help to report the patient's history. The patient has been lethargic and confused for the past week. She has issues with mobility at this time. She is constipated.   The family reports the patient fell last October, and her PCP suspected she broke her back. The patient received a cortisone injection in January, which was followed by onset of pain and decreased mobility. The patient was subsequently admitted to [name], where the family reports they discovered she was being given someone else's medicine. This led to the patient's confused mental status and difficulty with ambulation. The  patient fell again while in [name], and she was brought back to home by her family. She has 24/7 care at home. In the ED, she was found to have calcium more than 15, she was admitted for further management. She received calcitonin, her calcium came down to 12.7 today. Her SPEP was positive for M protein-0.4 g/dl, possible represent a monoclonal gammopathy. Her 24-hour urine light chains are pending. Her PTH was normal at 20, PTH-rp is pending.   CURRENT THERAPY:  1. RD (Revlimid 10 mg daily, 3 weeks on, one week off, and weekly dexamethasone 65m), started on 11/10/2016 2. Aredia [id number] weeks, started on 10/24/2016 for hypercalcemia   INTERIM HISTORY: ChKYNSLEI ART89.o. female is here for follow up accompanied by her family. She says she isn't doing bad and this is her second week on Revlimid. 8-10/10 back pain that hurts more when she stands, changes positions in the bed or walks. She does PT and takes Tramadol prn to help with it. She only moves around about once weekly when she attends PT. Her daughter reports he has improved with her cognitive skills.  MEDICAL HISTORY:  Past Medical History:  Diagnosis Date  . Allergy   . Arthritis   . Chronic kidney disease (CKD), stage III (moderate)    /nArchie Endo/04/2017  . Depression    Risperdal "twice/day" (09/23/2016)  . Glaucoma   . Hiatal hernia   . Hypertension   . Intractable back pain    /notes 09/23/2016  . Obesity   .  Osteoporosis    vertebral fracture in 2012  . Upper GI bleed 04/2004   secondary to Mallory-Weiss tear/notes 12/28/2010  . Vertigo     SURGICAL HISTORY: Past Surgical History:  Procedure Laterality Date  . ABDOMINAL HYSTERECTOMY  1974   partial  . APPENDECTOMY     as a child/notes 12/28/2010  . BACK SURGERY    . BREAST BIOPSY Left 1999   Archie Endo 12/28/2010  . CATARACT EXTRACTION, BILATERAL Bilateral 2000s  . ESOPHAGOGASTRODUODENOSCOPY  04/2004   Archie Endo 12/28/2010  . KYPHOPLASTY  05/2011   T10/notes  05/25/2011  . LAPAROSCOPIC CHOLECYSTECTOMY  10/2007   Archie Endo 12/14/2010    SOCIAL HISTORY: Social History   Social History  . Marital status: Widowed    Spouse name: N/A  . Number of children: N/A  . Years of education: N/A   Occupational History  . Not on file.   Social History Main Topics  . Smoking status: Never Smoker  . Smokeless tobacco: Never Used  . Alcohol use Yes     Comment: 09/23/2016 "glass of wine a few times/year"  . Drug use: No  . Sexual activity: No   Other Topics Concern  . Not on file   Social History Narrative  . No narrative on file    FAMILY HISTORY: Family History  Problem Relation Age of Onset  . Hypertension Mother   . Osteoporosis Mother   . CVA Mother   . Cancer Neg Hx     ALLERGIES:  is allergic to alendronate; penicillins; and sulfonamide derivatives.  MEDICATIONS:  Current Outpatient Prescriptions  Medication Sig Dispense Refill  . amLODipine (NORVASC) 5 MG tablet Take 1 tablet (5 mg total) by mouth daily. 30 tablet 0  . bisacodyl (DULCOLAX) 10 MG suppository Place 1 suppository (10 mg total) rectally daily as needed for moderate constipation. (Patient not taking: Reported on 10/18/2016) 12 suppository 0  . dexamethasone (DECADRON) 4 MG tablet Take 1 tablet (4 mg total) by mouth once a week. (Patient not taking: Reported on 11/01/2016) 30 tablet 1  . feeding supplement, ENSURE ENLIVE, (ENSURE ENLIVE) LIQD Take 237 mLs by mouth 2 (two) times daily between meals. 237 mL 12  . [START ON 11/25/2016] ibandronate (BONIVA) 150 MG tablet Take 1 tablet (150 mg total) by mouth every 30 (thirty) days. Take in the morning with a full glass of water, on an empty stomach, and do not take anything else by mouth or lie down for the next 30 min. (Patient not taking: Reported on 11/01/2016) 3 tablet 0  . lenalidomide (REVLIMID) 10 MG capsule Take 1 capsule (10 mg total) by mouth daily. (Patient not taking: Reported on 11/01/2016) 21 capsule 0  . magnesium oxide  (MAG-OX) 400 (241.3 Mg) MG tablet Take 1 tablet (400 mg total) by mouth daily. 7 tablet 0  . meclizine (ANTIVERT) 25 MG tablet TAKE ONE TABLET BY MOUTH THREE TIMES DAILY AS NEEDED (Patient taking differently: Take 25 mg by mouth 3 (three) times daily as needed for dizziness. TAKE ONE TABLET BY MOUTH THREE TIMES DAILY AS NEEDED) 90 tablet 0  . nitrofurantoin (MACRODANTIN) 50 MG capsule     . ondansetron (ZOFRAN ODT) 4 MG disintegrating tablet Take 1-2 tablets (4-8 mg total) by mouth every 8 (eight) hours as needed for nausea or vomiting. 20 tablet 2  . polyethylene glycol powder (GLYCOLAX/MIRALAX) powder Take 17 g by mouth daily.    . RESTASIS 0.05 % ophthalmic emulsion Place 1 drop into both eyes daily.     Marland Kitchen  traMADol (ULTRAM) 50 MG tablet Take 50 mg by mouth every 6 (six) hours as needed.     No current facility-administered medications for this visit.     REVIEW OF SYSTEMS:  Constitutional: Denies fevers, chills or abnormal night sweats (+) nausea, decreased appetite Eyes: Denies blurriness of vision, double vision or watery eyes Ears, nose, mouth, throat, and face: Denies mucositis or sore throat (+) vertigo Respiratory: Denies cough, dyspnea or wheezes Cardiovascular: Denies palpitation, chest discomfort or lower extremity swelling Gastrointestinal:  Denies nausea, heartburn or change in bowel habits Skin: Denies abnormal skin rashes Lymphatics: Denies new lymphadenopathy or easy bruising Neurological:Denies numbness, tingling or new weaknesses  Behavioral/Psych: Mood is stable, no new changes  Musculoskeletal: (+) back  pain All other systems were reviewed with the patient and are negative.  PHYSICAL EXAMINATION: ECOG PERFORMANCE STATUS: 3  Vitals:   11/17/16 1148  BP: 123/60  Pulse: (!) 106  Resp: 18  Temp: 98.7 F (37.1 C)   There were no vitals filed for this visit. GENERAL:alert, no distress and comfortable, sitting in wheelchair  SKIN: skin color, texture, turgor are  normal, no rashes or significant lesions EYES: normal, conjunctiva are pink and non-injected, sclera clear OROPHARYNX:no exudate, no erythema and lips, buccal mucosa, and tongue normal  NECK: supple, thyroid normal size, non-tender, without nodularity LYMPH:  no palpable lymphadenopathy in the cervical, axillary or inguinal LUNGS: clear to auscultation and percussion with normal breathing effort HEART: regular rate & rhythm and no murmurs and no lower extremity edema ABDOMEN:abdomen soft, non-tender and normal bowel sounds Musculoskeletal:no cyanosis of digits and no clubbing  PSYCH: alert & oriented x 3 with fluent speech NEURO: no focal motor/sensory deficits  LABORATORY DATA:  I have reviewed the data as listed CBC Latest Ref Rng & Units 11/17/2016 11/01/2016 10/26/2016  WBC 3.9 - 10.3 10e3/uL 6.3 8.6 8.0  Hemoglobin 11.6 - 15.9 g/dL 8.6(L) 9.4(L) 7.6(L)  Hematocrit 34.8 - 46.6 % 26.9(L) 27.6(L) 22.3(L)  Platelets 145 - 400 10e3/uL 135(L) 232 187   CMP Latest Ref Rng & Units 11/17/2016 11/01/2016 11/01/2016  Glucose 70 - 140 mg/dl 166(H) 133 -  BUN 7.0 - 26.0 mg/dL 15.8 14.4 -  Creatinine 0.6 - 1.1 mg/dL 1.0 1.1 -  Sodium 136 - 145 mEq/L 140 142 -  Potassium 3.5 - 5.1 mEq/L 3.6 4.3 -  Chloride 101 - 111 mmol/L - - -  CO2 22 - 29 mEq/L 25 25 -  Calcium 8.4 - 10.4 mg/dL 10.5(H) 10.8(H) -  Total Protein 6.4 - 8.3 g/dL 6.3(L) 6.5 6.2  Total Bilirubin 0.20 - 1.20 mg/dL 0.90 0.47 -  Alkaline Phos 40 - 150 U/L 142 175(H) -  AST 5 - 34 U/L 14 16 -  ALT 0 - 55 U/L 8 9 -    MM LAB: M-protein (g/dl) 10/19/2016: 0.4  Serum kappa, lamda light chains and ratio (mg/L) 10/21/2016: 14.8, 5362, 0.00  24 hour urine (total protein, free lambda, ration) 10/21/2016: 1463, 1570, 0.06  PATHOLOGY: Interpretation 10/25/2016 Bone Marrow Flow Cytometry - NO MONOCLONAL B-CELL OR PHENOTYPICALLY ABERRANT T-CELL POPULATION IDENTIFIED.  Diagnosis 10/25/2016 Bone Marrow, Aspirate,Biopsy, and Clot - PLASMA CELL  MYELOMA. - SEE COMMENT. PERIPHERAL BLOOD: - NORMOCYTIC ANEMIA. Diagnosis Note Overall, the marrow is hypercellular with increased lambda-restricted plasma cells (62% aspirate, 90% CD138). There is partial expression of CD20, but no monoclonal B-cell population is detected by flow cytometry.     RADIOGRAPHIC STUDIES: I have personally reviewed the radiological images as listed and  agreed with the findings in the report. Dg Abd 1 View  Result Date: 10/22/2016 CLINICAL DATA:  N/V, family report patient started to vomit a few minutes after eating, she seems to have trouble with solid, not problem with meds or liquid. H/o CKD stage III, Hiatal hernia, upper GI bleed in 2005. Surgical h/o cholecystectomy in 2009. EXAM: ABDOMEN - 1 VIEW COMPARISON:  None. FINDINGS: Nonobstructive bowel gas pattern. No pneumoperitoneum, pneumatosis or portal venous gas. Limited visualization of lower thorax is normal. Moderate multilevel thoracolumbar spine degenerative change, incompletely evaluated. Post lower thoracic vertebral body augmentation, incompletely evaluated. Post cholecystectomy. IMPRESSION: Nonobstructive bowel gas pattern. Electronically Signed   By: Sandi Mariscal M.D.   On: 10/22/2016 14:31   Ct Soft Tissue Neck Wo Contrast  Result Date: 10/19/2016 CLINICAL DATA:  Fall 10/02/2016. Left shoulder pain and swelling. Dysphagia. EXAM: CT NECK WITHOUT CONTRAST TECHNIQUE: Multidetector CT imaging of the neck was performed following the standard protocol without intravenous contrast. COMPARISON:  CT cervical spine 10/02/2016 FINDINGS: Pharynx and larynx: Negative Salivary glands: Negative Thyroid: Negative Lymph nodes: No pathologic lymph nodes in the neck. Vascular: Limited evaluation due to lack of intravenous contrast. Calcification in the left internal carotid artery. Limited intracranial: Negative Visualized orbits: Negative Mastoids and visualized paranasal sinuses: Mild mucosal edema left maxillary sinus.  Otherwise negative. Skeleton: Fracture of the left medial clavicle with mild displacement. Surrounding soft tissue calcification most compatible with calcifying hematoma. No bony union. There is mild periosteal reaction. Reconstructed sagittal images demonstrate moderate fractures of T2 and T4. These show sclerosis and could be healing fractures. These were not scanned on the prior cervical spine CT. There is moderate disc degeneration and spurring throughout the cervical spine. Upper chest: Chest CT from today reported separately Other: None IMPRESSION: Subacute fracture left medial clavicle. Calcifying hematoma around the fracture site. This accounts for the palpable abnormality. Moderately severe compression fractures T2 and T4 which may be healing fractures. No mass or soft tissue edema in the neck. Electronically Signed   By: Franchot Gallo M.D.   On: 10/19/2016 15:57   Ct Chest Wo Contrast  Result Date: 10/19/2016 CLINICAL DATA:  Upon assessing pt, a hard, bony prominence noted at along the left throat/clavicle area. EXAM: CT CHEST WITHOUT CONTRAST TECHNIQUE: Multidetector CT imaging of the chest was performed following the standard protocol without IV contrast. COMPARISON:  None. FINDINGS: Bones/Joint/Cartilage Healing nondisplaced fracture of the left clavicular head with severe surrounding callus formation and soft tissue swelling likely accounting for the palpable abnormality. No sternoclavicular dislocation. Mild osteoarthritis of the right sternoclavicular joint. There is suggestion of a lytic lesion at the fracture site which may be secondary to metastatic disease or multiple myeloma versus osteolysis secondary to the fracture. Mild arthropathy of bilateral acromioclavicular joints. No aggressive lytic or sclerotic osseous lesion. Degenerative disc disease with disc height loss C3-4, C5-6 and C6-7. Mild broad-based disc bulge at C3-4 with bilateral mild facet arthropathy. Bilateral uncovertebral  degenerative changes at C4-5 and C5-6 with bilateral facet arthropathy and foraminal stenosis. Broad-based disc osteophyte complex at C6-7 with bilateral facet arthropathy. Bilateral facet arthropathy at C7-T1. T2 vertebral body compression fracture likely acute. Lucent lesion in the left lateral aspect of the T2 vertebral body which may reflect metastatic disease versus multiple myeloma. Chronic T4 vertebral body compression fracture. Ligaments Ligaments are suboptimally evaluated by CT. Muscles and Tendons Muscles are normal.  No muscle atrophy. Soft tissue No fluid collection or hematoma. No soft tissue mass. Small right pleural  effusion. IMPRESSION: 1. Healing nondisplaced fracture of the left clavicular head with severe surrounding callus formation and soft tissue swelling likely accounting for the palpable abnormality. There is suggestion of a lytic lesion at the fracture site which may be secondary to metastatic disease or multiple myeloma versus osteolysis secondary to the fracture. 2. Acute T2 vertebral body compression fracture with mild height loss. No retropulsion into the spinal canal. Lucent lesion in the left lateral aspect of the T2 vertebral body which may reflect metastatic disease versus multiple myeloma. Electronically Signed   By: Kathreen Devoid   On: 10/19/2016 15:56   Dg Esophagus  Result Date: 10/24/2016 CLINICAL DATA:  Difficulty swallowing pre nausea vomiting EXAM: ESOPHOGRAM/BARIUM SWALLOW TECHNIQUE: Single contrast examination was performed using  thin. FLUOROSCOPY TIME:  Fluoroscopy Time:  2 minutes 30 seconds Radiation Exposure Index (if provided by the fluoroscopic device): 25.0 mGy Number of Acquired Spot Images: 10 COMPARISON:  CT 06/20/2016, 10/19/2016 FINDINGS: Limited exam due to patient immobility Esophagitis is mildly patulous. Contrast flows through the esophagus and GE junction without high-grade obstruction. There is poor esophageal motility with tertiary contractions.  Hiatal hernia noted. 13 mm tablet was administered. Barium tablet traveled into the hiatal hernia sac. Potential gastric diverticulum retains barium tablet. IMPRESSION: 1. Esophageal dysmotility. 2. Hiatal hernia with potential gastric diverticulum. Hiatal hernia versus diverticulum retains the barium tablet. 3. No high-grade obstruction. Electronically Signed   By: Suzy Bouchard M.D.   On: 10/24/2016 13:08   Ct Biopsy  Result Date: 10/25/2016 INDICATION: Hypercalcemia and monoclonal gammopathy of uncertain significance. EXAM: CT GUIDED BONE MARROW ASPIRATION AND CORE BIOPSY Interventional Radiologist:  Criselda Peaches, MD MEDICATIONS: None. ANESTHESIA/SEDATION: Moderate (conscious) sedation was employed during this procedure. A total of 1 milligrams versed and 50 micrograms fentanyl were administered intravenously. The patient's level of consciousness and vital signs were monitored continuously by radiology nursing throughout the procedure under my direct supervision. Total monitored sedation time: 13 minutes FLUOROSCOPY TIME:  Fluoroscopy Time: 0 minutes 0 seconds (0 mGy). COMPLICATIONS: None immediate. Estimated blood loss: <25 mL PROCEDURE: Informed written consent was obtained from the patient after a thorough discussion of the procedural risks, benefits and alternatives. All questions were addressed. Maximal Sterile Barrier Technique was utilized including caps, mask, sterile gowns, sterile gloves, sterile drape, hand hygiene and skin antiseptic. A timeout was performed prior to the initiation of the procedure. The patient was positioned prone and non-contrast localization CT was performed of the pelvis to demonstrate the iliac marrow spaces. Maximal barrier sterile technique utilized including caps, mask, sterile gowns, sterile gloves, large sterile drape, hand hygiene, and betadine prep. Under sterile conditions and local anesthesia, an 11 gauge coaxial bone biopsy needle was advanced into the  left iliac marrow space. Needle position was confirmed with CT imaging. Initially, bone marrow aspiration was performed. Next, the 11 gauge outer cannula was utilized to obtain a left iliac bone marrow core biopsy. Needle was removed. Hemostasis was obtained with compression. The patient tolerated the procedure well. Samples were prepared with the cytotechnologist. IMPRESSION: Technically successful CT-guided bone marrow biopsy of the left iliac bone. Signed, Criselda Peaches, MD [facility] Radiology Electronically Signed   By: Jacqulynn Cadet M.D.   On: 10/25/2016 10:53   Dg Chest Port 1 View  Result Date: 10/22/2016 CLINICAL DATA:  Cough EXAM: PORTABLE CHEST 1 VIEW COMPARISON:  10/19/2016 FINDINGS: Cardiac shadow is enlarged. Aortic calcifications are again seen. Changes of prior vertebral augmentation are noted.  The lungs are clear with the exception of nodular appearing density in the left upper lobe which corresponds to some pleural-based infiltrate IMPRESSION: Stable changes when compared with recent CT. No acute abnormality noted. Electronically Signed   By: Inez Catalina M.D.   On: 10/22/2016 14:45   Dg Bone Survey Met  Result Date: 10/21/2016 CLINICAL DATA:  Multiple myeloma. EXAM: METASTATIC BONE SURVEY COMPARISON:  Radiographs of April 25, 2016. MRI of March 18, 2011. FINDINGS: There is interval development of mild compression deformity of L3 vertebral body concerning for fracture of indeterminate age. Status post kyphoplasty of T9 vertebral body. Interval development of moderate compression deformity involving T4 vertebral body concerning for pathologic fracture. Possible 2 small lucent lesions are seen in the skull posteriorly. IMPRESSION: Possible 2 small lucent lesions seen posteriorly in the skull which may represent multiple myeloma. Interval development of moderate compression deformity of T4 vertebral body concerning for possible  pathologic fracture given history of multiple myeloma. Also noted is interval development of mild compression deformity of L3 vertebral body concerning for fracture of indeterminate age ; MRI may be performed further evaluation. Electronically Signed   By: Marijo Conception, M.D.   On: 10/21/2016 10:50   Ct Bone Marrow Biopsy & Aspiration  Result Date: 10/25/2016 INDICATION: Hypercalcemia and monoclonal gammopathy of uncertain significance. EXAM: CT GUIDED BONE MARROW ASPIRATION AND CORE BIOPSY Interventional Radiologist:  Criselda Peaches, MD MEDICATIONS: None. ANESTHESIA/SEDATION: Moderate (conscious) sedation was employed during this procedure. A total of 1 milligrams versed and 50 micrograms fentanyl were administered intravenously. The patient's level of consciousness and vital signs were monitored continuously by radiology nursing throughout the procedure under my direct supervision. Total monitored sedation time: 13 minutes FLUOROSCOPY TIME:  Fluoroscopy Time: 0 minutes 0 seconds (0 mGy). COMPLICATIONS: None immediate. Estimated blood loss: <25 mL PROCEDURE: Informed written consent was obtained from the patient after a thorough discussion of the procedural risks, benefits and alternatives. All questions were addressed. Maximal Sterile Barrier Technique was utilized including caps, mask, sterile gowns, sterile gloves, sterile drape, hand hygiene and skin antiseptic. A timeout was performed prior to the initiation of the procedure. The patient was positioned prone and non-contrast localization CT was performed of the pelvis to demonstrate the iliac marrow spaces. Maximal barrier sterile technique utilized including caps, mask, sterile gowns, sterile gloves, large sterile drape, hand hygiene, and betadine prep. Under sterile conditions and local anesthesia, an 11 gauge coaxial bone biopsy needle was advanced into the left iliac marrow space. Needle position was confirmed with CT imaging. Initially, bone  marrow aspiration was performed. Next, the 11 gauge outer cannula was utilized to obtain a left iliac bone marrow core biopsy. Needle was removed. Hemostasis was obtained with compression. The patient tolerated the procedure well. Samples were prepared with the cytotechnologist. IMPRESSION: Technically successful CT-guided bone marrow biopsy of the left iliac bone. Signed, Criselda Peaches, MD [facility] Radiology Electronically Signed   By: Jacqulynn Cadet M.D.   On: 10/25/2016 10:53   ASSESSMENT & PLAN:  83 y.o.woman with HTN and CKD, presented with hypercalcemia, worsening anemia since 06/2016, s/p multiple fall, multiple hospitalization recently for back pain , CT scan showedT2 vertebral body compression fracture with mild height loss.  1. Multiple Myeloma, lambda light chain disease, staging pending, intermediate risk (13q-) -I reviewed the bone marrow biopsy results with the patient and her family, along with the scan findings  -she presents with hypercalcemia, anemia, mild renal failure and  multiple fractures after fall -I reviewed her cytogenetics and Fish testing results, which showed chromosome 13 every deletion, which is at intermediate risk for multiple myeloma. -We discussed treatment options.  due to advanced age, multiple comorbidities, very limited performance status, I do not believe she is a candidate for bone marrow transplant -I recommend her dexamethasone and Revlimid. Start on dexamethasone 47m weekly and Revlimid 167mdaily; 3 weeks on, 1 week off, at adjusted for her renal function. If she tolerates well and renal function improves, I will increase her Revlimid dose on subsequent cycles.  -due to her Intermediated risk disease, she may need Velcade. Due to her advanced age and comorbilities, I am concerned about her tolerance to Velcade. I will try RD for a few cycles, if she does not have good response, I will add Velcade.    -The goal of therapy is palliative, for disease control, improve her quality of life and prolong life. -She tolerating Revlimid and dexamethasone very well, we'll continue. - I will refill for 1 cycle - Repeat multiple myeloma every 4 weeks  -IV access issue: I encouraged the patient to drink more water before coming. If taking blood continues to be difficult I advised her she may need a port placed  2. Anemia  -secondary to MM and Revlimid. -She received blood transfusion in hospital -will monitor CBC closely -consider blood transfusion if Hb<8.0 or symptomatic anemia with Hb 8-9 - Hemoglobin 8.6 today, not symptomatic, hold blood transfusion for now - I will draw an extra tube for blood band during next lab just in case she needs a blood transfusion   3. Hypercalcemia  -She has received one dose of Aredia, hypercalcemia has nearly resolved now. We'll continue every 4 weeks for multiple myeloma, or more frequent if her hypercalcemia gets worse   4. Chronic CKD, stage III -improved -Avoid nephrotoxic medicine or contrast   5. Severe back pain and right hip pain -T2 compression fracture -Takes Tramadol as needed. I advised her she can take it 3x daily or every 8 hours  -I encourage her to continue PT   6. HTN -controlled with medication -managed by PCP  7. Osteoporosis and fractures -Takes Boniva, which has been switched to Aredia   8. Goal of care discussion  -We previoulsy discussed the incurable nature of her MM, and the overall poor prognosis, especially if she does not have good response to chemotherapy or progress on chemo -The patient understands the goal of care is palliative. -I recommend DNR/DNI, she will think about it   9. Nausea - I recommended she take some type of nausea medication. She is hesitant, but agreed totry Zofran 4-5m46mShe can take up to 3x daily  10. Anorexia - I encouraged her to eat snacks in between her meals -I recommend her to try  mirtazapine 7.5mg39m, may increase dose in future if needed   Plan  - I will prescribe Zofran 4mg,57mn for nausea, and mirtazapine 7.5 mg at bedtime daily - refill for 1 cycle revlimid 10mg 29m/u with lab 4/23, and Aredia injection    All questions were answered. The patient knows to call the clinic with any problems, questions or concerns.  I spent 30 minutes counseling the patient face to face. The total time spent in the appointment was 40 minutes and more than 50% was on counseling.  This document serves as a record of services personally performed by Kandi Brusseau FeTruitt MerleIt was created on her behalf  by Brandt Loosen, a trained medical scribe. The creation of this record is based on the scribe's personal observations and the provider's statements to them. This document has been checked and approved by the attending provider.   I have reviewed the above documentation for accuracy and completeness and I agree with the above.   Truitt Merle, MD 11/17/2016

## 2016-11-17 ENCOUNTER — Encounter: Payer: Self-pay | Admitting: Hematology

## 2016-11-17 ENCOUNTER — Telehealth: Payer: Self-pay | Admitting: Hematology

## 2016-11-17 ENCOUNTER — Other Ambulatory Visit (HOSPITAL_BASED_OUTPATIENT_CLINIC_OR_DEPARTMENT_OTHER): Payer: Medicare Other

## 2016-11-17 ENCOUNTER — Ambulatory Visit (HOSPITAL_BASED_OUTPATIENT_CLINIC_OR_DEPARTMENT_OTHER): Payer: Medicare Other | Admitting: Hematology

## 2016-11-17 VITALS — BP 123/60 | HR 106 | Temp 98.7°F | Resp 18 | Ht 60.0 in

## 2016-11-17 DIAGNOSIS — D6481 Anemia due to antineoplastic chemotherapy: Secondary | ICD-10-CM | POA: Diagnosis not present

## 2016-11-17 DIAGNOSIS — M545 Low back pain: Secondary | ICD-10-CM

## 2016-11-17 DIAGNOSIS — D630 Anemia in neoplastic disease: Secondary | ICD-10-CM

## 2016-11-17 DIAGNOSIS — I10 Essential (primary) hypertension: Secondary | ICD-10-CM

## 2016-11-17 DIAGNOSIS — M8000XS Age-related osteoporosis with current pathological fracture, unspecified site, sequela: Secondary | ICD-10-CM

## 2016-11-17 DIAGNOSIS — C9000 Multiple myeloma not having achieved remission: Secondary | ICD-10-CM

## 2016-11-17 DIAGNOSIS — M549 Dorsalgia, unspecified: Secondary | ICD-10-CM

## 2016-11-17 DIAGNOSIS — N183 Chronic kidney disease, stage 3 (moderate): Secondary | ICD-10-CM

## 2016-11-17 DIAGNOSIS — Z5189 Encounter for other specified aftercare: Secondary | ICD-10-CM | POA: Diagnosis not present

## 2016-11-17 DIAGNOSIS — R630 Anorexia: Secondary | ICD-10-CM

## 2016-11-17 DIAGNOSIS — M810 Age-related osteoporosis without current pathological fracture: Secondary | ICD-10-CM

## 2016-11-17 DIAGNOSIS — R110 Nausea: Secondary | ICD-10-CM

## 2016-11-17 DIAGNOSIS — M25551 Pain in right hip: Secondary | ICD-10-CM

## 2016-11-17 LAB — CBC WITH DIFFERENTIAL/PLATELET
BASO%: 0.2 % (ref 0.0–2.0)
Basophils Absolute: 0 10*3/uL (ref 0.0–0.1)
EOS%: 3.8 % (ref 0.0–7.0)
Eosinophils Absolute: 0.2 10*3/uL (ref 0.0–0.5)
HEMATOCRIT: 26.9 % — AB (ref 34.8–46.6)
HGB: 8.6 g/dL — ABNORMAL LOW (ref 11.6–15.9)
LYMPH#: 2.2 10*3/uL (ref 0.9–3.3)
LYMPH%: 35.1 % (ref 14.0–49.7)
MCH: 32.1 pg (ref 25.1–34.0)
MCHC: 32 g/dL (ref 31.5–36.0)
MCV: 100.4 fL (ref 79.5–101.0)
MONO#: 0.5 10*3/uL (ref 0.1–0.9)
MONO%: 7.8 % (ref 0.0–14.0)
NEUT#: 3.3 10*3/uL (ref 1.5–6.5)
NEUT%: 53.1 % (ref 38.4–76.8)
Platelets: 135 10*3/uL — ABNORMAL LOW (ref 145–400)
RBC: 2.68 10*6/uL — ABNORMAL LOW (ref 3.70–5.45)
RDW: 18.7 % — AB (ref 11.2–14.5)
WBC: 6.3 10*3/uL (ref 3.9–10.3)

## 2016-11-17 LAB — COMPREHENSIVE METABOLIC PANEL
ALT: 8 U/L (ref 0–55)
AST: 14 U/L (ref 5–34)
Albumin: 3.8 g/dL (ref 3.5–5.0)
Alkaline Phosphatase: 142 U/L (ref 40–150)
Anion Gap: 10 mEq/L (ref 3–11)
BUN: 15.8 mg/dL (ref 7.0–26.0)
CALCIUM: 10.5 mg/dL — AB (ref 8.4–10.4)
CO2: 25 meq/L (ref 22–29)
CREATININE: 1 mg/dL (ref 0.6–1.1)
Chloride: 105 mEq/L (ref 98–109)
EGFR: 61 mL/min/{1.73_m2} — ABNORMAL LOW (ref >90)
GLUCOSE: 166 mg/dL — AB (ref 70–140)
Potassium: 3.6 mEq/L (ref 3.5–5.1)
Sodium: 140 mEq/L (ref 136–145)
Total Bilirubin: 0.9 mg/dL (ref 0.20–1.20)
Total Protein: 6.3 g/dL — ABNORMAL LOW (ref 6.4–8.3)

## 2016-11-17 LAB — LACTATE DEHYDROGENASE: LDH: 144 U/L (ref 125–245)

## 2016-11-17 MED ORDER — ONDANSETRON 4 MG PO TBDP: 4.0000 mg | ORAL_TABLET | Freq: Three times a day (TID) | ORAL | 2 refills | Status: DC | PRN

## 2016-11-17 MED ORDER — MIRTAZAPINE 7.5 MG PO TABS: 7.5000 mg | ORAL_TABLET | Freq: Every day | ORAL | 0 refills | Status: DC

## 2016-11-17 NOTE — Telephone Encounter (Signed)
Appointments scheduled per 4.5.18 LOS. Patient given AVS report and calendars with future scheduled appointments.

## 2016-11-17 NOTE — Addendum Note (Signed)
Addended by: Truitt Merle on: 11/17/2016 10:36 PM   Modules accepted: Orders

## 2016-11-18 LAB — BETA 2 MICROGLOBULIN, SERUM: BETA 2: 7.2 mg/L — AB (ref 0.6–2.4)

## 2016-11-19 ENCOUNTER — Telehealth: Payer: Self-pay | Admitting: Hematology

## 2016-11-19 NOTE — Telephone Encounter (Signed)
Scheduled infusion for 4/23 per sch message from MD

## 2016-12-01 NOTE — Progress Notes (Signed)
[name]  Telephone:([phone]) [phone] Fax:([phone]) [phone]  Clinic Follow Up Note   Patient Care Team: [name], MD as PCP - General (Family Medicine) 12/05/2016  CHIEF COMPLAINTS:  Follow up Multiple Myeloma      Multiple myeloma ([name])   10/18/2016 - 10/26/2016 Hospital Admission    Pt was admitted for symptomatic hypercalcemia, AKI, bone marrow biopsy done during hospital stay, and she received Aredia       10/19/2016 Tumor Marker    SPEP showed M-protein 0.4g/dl, serum lambda light chain 5362, ratio 0.00      10/25/2016 Bone Marrow Biopsy     Overall, the marrow is hypercellular with increased lambda-restricted plasma cells (62% aspirate, 90% CD138). There is partial expression of CD20, but no monoclonal B-cell population is detected by flow cytometry.       10/25/2016 Initial Diagnosis    Multiple myeloma ([name])      10/25/2016 Miscellaneous    Cytogenetics and FISH showed loss of 13q, which is intermediate risk for MM        HISTORY OF PRESENTING ILLNESS (10/20/2016):  83 year old African-American female, with past medical history of hypertension, chronic back pain, see [name] stage III, who presents with altered mental status. She was admitted for hypercalcemia. I was called for consult to rule out multiple myeloma.   The patient is joined by family today. They help to report the patient's history. The patient has been lethargic and confused for the past week. She has issues with mobility at this time. She is constipated.   The family reports the patient fell last October, and her PCP suspected she broke her back. The patient received a cortisone injection in January, which was followed by onset of pain and decreased mobility. The patient was subsequently admitted [name], where the family reports they discovered she was being given someone else's medicine. This led to the patient's confused mental status and difficulty with ambulation. The  patient fell again while in [name], and she was brought back to home by her family. She has 24/7 care at home. In the ED, she was found to have calcium more than 15, she was admitted for further management. She received calcitonin, her calcium came down to 12.7 today. Her SPEP was positive for M protein-0.4 g/dl, possible represent a monoclonal gammopathy. Her 24-hour urine light chains are pending. Her PTH was normal at 20, PTH-rp is pending.   CURRENT THERAPY:  1. RD (Revlimid 10 mg daily, 3 weeks on, one week off, and weekly dexamethasone 75m), started on 11/10/2016 2. Aredia 639mevery 4 weeks, started on 10/24/2016 for hypercalcemia   INTERIM HISTORY: ChJANAN BOGIE49.o. female is here for follow up accompanied by her family. She reports she is doing well. She is still doing PT twice weekly, and feels that she is getting stronger. She reports she is getting up and moving around more often than before. Her back pain is ongoing, but is tolerable; she takes Tramadol infrequently as needed. Her pain does not keep her up at night. The patient also reports ongoing dysuria. Her family notes the patient "is not drinking enough," and wonder if this could be causing the patient's discomfort.  The patient reports she did well with her first cycle of Revlimid. She experienced some nausea and vomiting, though it was not severe.    MEDICAL HISTORY:  Past Medical History:  Diagnosis Date  . Allergy   . Arthritis   . Chronic kidney  disease (CKD), stage III (moderate)    [name] 09/23/2016  . Depression    Risperdal "twice/day" (09/23/2016)  . Glaucoma   . Hiatal hernia   . Hypertension   . Intractable back pain    /notes 09/23/2016  . Obesity   . Osteoporosis    vertebral fracture in 2012  . Upper GI bleed 04/2004   secondary to Mallory-Weiss tear/notes 12/28/2010  . Vertigo     SURGICAL HISTORY: Past Surgical History:  Procedure Laterality Date  . ABDOMINAL HYSTERECTOMY  1974    partial  . APPENDECTOMY     as a child/notes 12/28/2010  . BACK SURGERY    . BREAST BIOPSY Left 1999   [name] 12/28/2010  . CATARACT EXTRACTION, BILATERAL Bilateral 2000s  . ESOPHAGOGASTRODUODENOSCOPY  04/2004   [name] 12/28/2010  . KYPHOPLASTY  05/2011   T10/notes 05/25/2011  . LAPAROSCOPIC CHOLECYSTECTOMY  10/2007   [name] 12/14/2010    SOCIAL HISTORY: Social History   Social History  . Marital status: Widowed    Spouse name: N/A  . Number of children: N/A  . Years of education: N/A   Occupational History  . Not on file.   Social History Main Topics  . Smoking status: Never Smoker  . Smokeless tobacco: Never Used  . Alcohol use Yes     Comment: 09/23/2016 "glass of wine a few times/year"  . Drug use: No  . Sexual activity: No   Other Topics Concern  . Not on file   Social History Narrative  . No narrative on file    FAMILY HISTORY: Family History  Problem Relation Age of Onset  . Hypertension Mother   . Osteoporosis Mother   . CVA Mother   . Cancer Neg Hx     ALLERGIES:  is allergic to alendronate; penicillins; and sulfonamide derivatives.  MEDICATIONS:  Current Outpatient Prescriptions  Medication Sig Dispense Refill  . amLODipine (NORVASC) 5 MG tablet Take 1 tablet (5 mg total) by mouth daily. 30 tablet 0  . bisacodyl (DULCOLAX) 10 MG suppository Place 1 suppository (10 mg total) rectally daily as needed for moderate constipation. 12 suppository 0  . dexamethasone (DECADRON) 4 MG tablet Take 5 tablets (20 mg total) by mouth once a week. 30 tablet 1  . feeding supplement, ENSURE ENLIVE, (ENSURE ENLIVE) LIQD Take 237 mLs by mouth 2 (two) times daily between meals. 237 mL 12  . magnesium oxide (MAG-OX) 400 (241.3 Mg) MG tablet Take 1 tablet (400 mg total) by mouth daily. 7 tablet 0  . meclizine (ANTIVERT) 25 MG tablet TAKE ONE TABLET BY MOUTH THREE TIMES DAILY AS NEEDED (Patient taking differently: Take 25 mg by mouth 3 (three) times daily as needed for  dizziness. TAKE ONE TABLET BY MOUTH THREE TIMES DAILY AS NEEDED) 90 tablet 0  . mirtazapine (REMERON) 7.5 MG tablet Take 1 tablet (7.5 mg total) by mouth at bedtime. 30 tablet 0  . nitrofurantoin (MACRODANTIN) 50 MG capsule     . ondansetron (ZOFRAN ODT) 4 MG disintegrating tablet Take 1-2 tablets (4-8 mg total) by mouth every 8 (eight) hours as needed for nausea or vomiting. 20 tablet 2  . polyethylene glycol powder (GLYCOLAX/MIRALAX) powder Take 17 g by mouth daily.    . RESTASIS 0.05 % ophthalmic emulsion Place 1 drop into both eyes daily.     . traMADol (ULTRAM) 50 MG tablet Take 1 tablet (50 mg total) by mouth every 6 (six) hours as needed. 90 tablet 1  . lenalidomide (REVLIMID) 15  MG capsule Take 1 capsule (15 mg total) by mouth daily. Take 15 mg daily for  21  Days  ,  Off  7  Days. 21 capsule 0   No current facility-administered medications for this visit.     REVIEW OF SYSTEMS:  Constitutional: Denies fevers, chills or abnormal night sweats (+) decreased fatigue and increased strength Eyes: Denies blurriness of vision, double vision or watery eyes Ears, nose, mouth, throat, and face: Denies mucositis or sore throat (+) vertigo Respiratory: Denies cough, dyspnea or wheezes Cardiovascular: Denies palpitation, chest discomfort or lower extremity swelling Gastrointestinal:  Denies heartburn or change in bowel habits (+) mild nausea and vomiting, (+) dysuria Skin: Denies abnormal skin rashes Lymphatics: Denies new lymphadenopathy or easy bruising Neurological:Denies numbness, tingling or new weaknesses  Behavioral/Psych: Mood is stable, no new changes  Musculoskeletal: (+) back pain controlled with Tramadol as needed All other systems were reviewed with the patient and are negative.  PHYSICAL EXAMINATION: ECOG PERFORMANCE STATUS: 3  Vitals:   12/05/16 1307  BP: 139/70  Pulse: 100  Resp: 18  Temp: 98.9 F (37.2 C)   Filed Weights   12/05/16 1307  Weight: 139 lb 6.4 oz (63.2  kg)   GENERAL:alert, no distress and comfortable, sitting in wheelchair  SKIN: skin color, texture, turgor are normal, no rashes or significant lesions EYES: normal, conjunctiva are pink and non-injected, sclera clear OROPHARYNX:no exudate, no erythema and lips, buccal mucosa, and tongue normal  NECK: supple, thyroid normal size, non-tender, without nodularity LYMPH:  no palpable lymphadenopathy in the cervical, axillary or inguinal LUNGS: clear to auscultation and percussion with normal breathing effort HEART: regular rate & rhythm and no murmurs and no lower extremity edema ABDOMEN:abdomen soft, non-tender and normal bowel sounds Musculoskeletal:no cyanosis of digits and no clubbing  PSYCH: alert & oriented x 3 with fluent speech NEURO: no focal motor/sensory deficits  LABORATORY DATA:  I have reviewed the data as listed CBC Latest Ref Rng & Units 12/05/2016 11/17/2016 11/01/2016  WBC 3.9 - 10.3 10e3/uL 4.9 6.3 8.6  Hemoglobin 11.6 - 15.9 g/dL 9.2(L) 8.6(L) 9.4(L)  Hematocrit 34.8 - 46.6 % 28.0(L) 26.9(L) 27.6(L)  Platelets 145 - 400 10e3/uL 195 135(L) 232   CMP Latest Ref Rng & Units 12/05/2016 11/17/2016 11/01/2016  Glucose 70 - 140 mg/dl 117 166(H) 133  BUN 7.0 - 26.0 mg/dL 11.5 15.8 14.4  Creatinine 0.6 - 1.1 mg/dL 0.9 1.0 1.1  Sodium 136 - 145 mEq/L 141 140 142  Potassium 3.5 - 5.1 mEq/L 4.0 3.6 4.3  Chloride 101 - 111 mmol/L - - -  CO2 22 - 29 mEq/L 24 25 25   Calcium 8.4 - 10.4 mg/dL 9.9 10.5(H) 10.8(H)  Total Protein 6.4 - 8.3 g/dL 5.8(L) 6.3(L) 6.5  Total Bilirubin 0.20 - 1.20 mg/dL 0.62 0.90 0.47  Alkaline Phos 40 - 150 U/L 142 142 175(H)  AST 5 - 34 U/L 12 14 16   ALT 0-55 U/L U/L <6 8 9     MM LAB: M-protein (g/dl) 10/19/2016: 0.4 12/05/16: PENDING  Serum kappa, lamda light chains and ratio (mg/L) 10/21/2016: 14.8, 5362, 0.00  24 hour urine (total protein, free lambda, ration) 10/21/2016: 1463, 1570, 0.06  PATHOLOGY: Interpretation 10/25/2016 Bone Marrow Flow  Cytometry - NO MONOCLONAL B-CELL OR PHENOTYPICALLY ABERRANT T-CELL POPULATION IDENTIFIED.  Diagnosis 10/25/2016 Bone Marrow, Aspirate,Biopsy, and Clot - PLASMA CELL MYELOMA. - SEE COMMENT. PERIPHERAL BLOOD: - NORMOCYTIC ANEMIA. Diagnosis Note Overall, the marrow is hypercellular with increased lambda-restricted plasma cells (62% aspirate, 90%  CD138). There is partial expression of CD20, but no monoclonal B-cell population is detected by flow cytometry.     RADIOGRAPHIC STUDIES: I have personally reviewed the radiological images as listed and agreed with the findings in the report. No results found. ASSESSMENT & PLAN:  83 y.o.woman with HTN and CKD, presented with hypercalcemia, worsening anemia since 06/2016, s/p multiple fall, multiple hospitalization recently for back pain , CT scan showedT2 vertebral body compression fracture with mild height loss.  1. 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 -We previously discussed treatment options.  due to advanced age, multiple comorbidities, very limited performance status, I do not believe she is a candidate for bone marrow transplant -I previously recommend her dexamethasone and Revlimid. Start on dexamethasone 51m weekly and Revlimid 140mdaily; 3 weeks on, 1 week off, at adjusted for her renal function. If she tolerates well and renal function improves, I will increase her Revlimid dose on subsequent cycles.  -due to her Intermediated risk disease, she may need Velcade. Due to her advanced age and comorbilities, I am concerned  about her tolerance to Velcade. I will try RD for a few cycles, if she does not have good response, I will add Velcade.  -The goal of therapy is palliative, for disease control, improve her quality of life and prolong life. -She tolerating Revlimid and dexamethasone very well, we'll continue. - Refill cycle 2 Revlimid, Increase dose from 1072mo 15 mg. -she has been only taking dexamethasone 4 mg weekly, we'll increase to 20 mg weekly, starting with next cycle Revlimid. - Repeat multiple myeloma panel every 4 weeks  -IV access issue: I have encouraged the patient to drink more water before coming. If taking blood continues to be difficult I advised her she may need a port placed. - I encouraged the patient to use ondansetron 30 minutes prior to meals to help prevent nausea and vomiting.  2. Anemia  -secondary to MM and Revlimid. -She received blood transfusion in hospital -will monitor CBC closely -consider blood transfusion if Hb<8.0 or symptomatic anemia with Hb 8-9 - Hemoglobin 9.2 today 12/05/16, not symptomatic, hold blood transfusion for now. - I will draw an extra tube for blood band during next lab just in case she needs a blood transfusion   3. Hypercalcemia  -She has received one dose of Aredia, hypercalcemia has nearly resolved now. We'll continue every 4 weeks for multiple myeloma, or more frequent if her hypercalcemia gets worse. -Her calcium is back to normal now.  4. Chronic CKD, stage II -much improved -Avoid nephrotoxic medicine or contrast   5. Severe back pain and right hip pain -T2 compression fracture -Takes Tramadol as needed. I advised her she can take it 3x daily or every 8 hours; she may also take Tylenol as needed instead of tramadol. -I encourage her to continue PT. The patient is in agreement.  6. HTN -controlled with medication -managed by PCP  7. Osteoporosis and fractures -Takes Boniva, which has been switched to Aredia  -Due to her poor IV access,  I'll switch Aredia to Xgeva every 4 weeks, starting in 4 weeks  8. Goal of care  discussion  -We previoulsy discussed the incurable nature of her MM, and the overall poor prognosis, especially if she does not have good response to chemotherapy or progress on chemo -The patient understands the goal of care is palliative. -I recommend DNR/DNI, she will think about it   9. Nausea - I recommended she take some type of nausea medication. She is hesitant, but agreed totry Zofran 4-19m. She can take up to 3x daily. - Patient may take ondansetron 30 minutes prior to meals to prevent nausea.  10. Anorexia - I have encouraged her to eat snacks in between her meals -I previously recommended her to try mirtazapine 7.558mHS, may increase dose in future if needed   11. Ongoing dysuria - The patient had a recent urine test with Dr. WoJacelyn GripShe does not know the results of this. I encouraged her to follow up on this in case she needs antibiotics. - I encouraged the patient to stay well hydrated to reduce discomfort and help prevent future infection.  Plan - Reviewed labs. MM panel pending today. - Patient will proceed with Aredia infusion today, and switch to Xgeva in 4 weeks  - Continue ondansetron as needed. - Refill 1 cycle Revlimid, increase dose from 1038mo 15 mg. - Increase Dexamethasone dose to 84m60mo be taken once weekly. Refilled today. - Refill tramadol today. - Follow up in 4 weeks with Xgeva injections and labs.  All questions were answered. The patient knows to call the clinic with any problems, questions or concerns.  I spent 20 minutes counseling the patient face to face. The total time spent in the appointment was 30 minutes and more than 50% was on counseling.  This document serves as a record of services personally performed by Bianney Rockwood Truitt Merle. It was created on her behalf by SaraMaryla Morrowtrained medical scribe. The creation of this record is based on the scribe's personal  observations and the provider's statements to them. This document has been checked and approved by the attending provider.  I have reviewed the above documentation for accuracy and completeness and I agree with the above.   FengTruitt Merle 12/05/2016

## 2016-12-05 ENCOUNTER — Encounter: Payer: Self-pay | Admitting: Hematology

## 2016-12-05 ENCOUNTER — Telehealth: Payer: Self-pay | Admitting: Hematology

## 2016-12-05 ENCOUNTER — Other Ambulatory Visit (HOSPITAL_BASED_OUTPATIENT_CLINIC_OR_DEPARTMENT_OTHER): Payer: Medicare Other

## 2016-12-05 ENCOUNTER — Ambulatory Visit (HOSPITAL_BASED_OUTPATIENT_CLINIC_OR_DEPARTMENT_OTHER): Payer: Medicare Other

## 2016-12-05 ENCOUNTER — Ambulatory Visit (HOSPITAL_BASED_OUTPATIENT_CLINIC_OR_DEPARTMENT_OTHER): Payer: Medicare Other | Admitting: Hematology

## 2016-12-05 ENCOUNTER — Other Ambulatory Visit: Payer: Self-pay | Admitting: *Deleted

## 2016-12-05 VITALS — BP 139/70 | HR 100 | Temp 98.9°F | Resp 18 | Ht 60.0 in | Wt 139.4 lb

## 2016-12-05 DIAGNOSIS — M545 Low back pain: Secondary | ICD-10-CM | POA: Diagnosis not present

## 2016-12-05 DIAGNOSIS — M810 Age-related osteoporosis without current pathological fracture: Secondary | ICD-10-CM | POA: Diagnosis not present

## 2016-12-05 DIAGNOSIS — D6481 Anemia due to antineoplastic chemotherapy: Secondary | ICD-10-CM

## 2016-12-05 DIAGNOSIS — R630 Anorexia: Secondary | ICD-10-CM | POA: Diagnosis not present

## 2016-12-05 DIAGNOSIS — N184 Chronic kidney disease, stage 4 (severe): Secondary | ICD-10-CM

## 2016-12-05 DIAGNOSIS — R110 Nausea: Secondary | ICD-10-CM | POA: Diagnosis not present

## 2016-12-05 DIAGNOSIS — D630 Anemia in neoplastic disease: Secondary | ICD-10-CM

## 2016-12-05 DIAGNOSIS — C9000 Multiple myeloma not having achieved remission: Secondary | ICD-10-CM

## 2016-12-05 DIAGNOSIS — M8000XS Age-related osteoporosis with current pathological fracture, unspecified site, sequela: Secondary | ICD-10-CM

## 2016-12-05 DIAGNOSIS — I10 Essential (primary) hypertension: Secondary | ICD-10-CM | POA: Diagnosis not present

## 2016-12-05 DIAGNOSIS — R300 Dysuria: Secondary | ICD-10-CM | POA: Diagnosis not present

## 2016-12-05 DIAGNOSIS — M25551 Pain in right hip: Secondary | ICD-10-CM | POA: Diagnosis not present

## 2016-12-05 LAB — COMPREHENSIVE METABOLIC PANEL
ALT: <6 U/L (ref 0–55)
ANION GAP: 12 meq/L — AB (ref 3–11)
AST: 12 U/L (ref 5–34)
Albumin: 3.7 g/dL (ref 3.5–5.0)
Alkaline Phosphatase: 142 U/L (ref 40–150)
BUN: 11.5 mg/dL (ref 7.0–26.0)
CO2: 24 meq/L (ref 22–29)
Calcium: 9.9 mg/dL (ref 8.4–10.4)
Chloride: 106 mEq/L (ref 98–109)
Creatinine: 0.9 mg/dL (ref 0.6–1.1)
EGFR: 65 mL/min/{1.73_m2} — AB (ref >90)
GLUCOSE: 117 mg/dL (ref 70–140)
Potassium: 4 mEq/L (ref 3.5–5.1)
SODIUM: 141 meq/L (ref 136–145)
TOTAL PROTEIN: 5.8 g/dL — AB (ref 6.4–8.3)
Total Bilirubin: 0.62 mg/dL (ref 0.20–1.20)

## 2016-12-05 LAB — CBC WITH DIFFERENTIAL/PLATELET
BASO%: 1.4 % (ref 0.0–2.0)
Basophils Absolute: 0.1 10*3/uL (ref 0.0–0.1)
EOS ABS: 0.1 10*3/uL (ref 0.0–0.5)
EOS%: 1.7 % (ref 0.0–7.0)
HCT: 28 % — ABNORMAL LOW (ref 34.8–46.6)
HGB: 9.2 g/dL — ABNORMAL LOW (ref 11.6–15.9)
LYMPH%: 47.1 % (ref 14.0–49.7)
MCH: 34.3 pg — AB (ref 25.1–34.0)
MCHC: 32.9 g/dL (ref 31.5–36.0)
MCV: 104.1 fL — AB (ref 79.5–101.0)
MONO#: 0.6 10*3/uL (ref 0.1–0.9)
MONO%: 12.2 % (ref 0.0–14.0)
NEUT%: 37.6 % — AB (ref 38.4–76.8)
NEUTROS ABS: 1.8 10*3/uL (ref 1.5–6.5)
PLATELETS: 195 10*3/uL (ref 145–400)
RBC: 2.69 10*6/uL — AB (ref 3.70–5.45)
RDW: 19.7 % — ABNORMAL HIGH (ref 11.2–14.5)
WBC: 4.9 10*3/uL (ref 3.9–10.3)
lymph#: 2.3 10*3/uL (ref 0.9–3.3)

## 2016-12-05 MED ORDER — PAMIDRONATE DISODIUM 90 MG/10ML IV SOLN
90.0000 mg | Freq: Once | INTRAVENOUS | Status: AC
  Administered 2016-12-05: 90 mg via INTRAVENOUS
  Filled 2016-12-05: qty 10

## 2016-12-05 MED ORDER — LENALIDOMIDE 15 MG PO CAPS: 15.0000 mg | ORAL_CAPSULE | Freq: Every day | ORAL | 0 refills | Status: DC

## 2016-12-05 MED ORDER — SODIUM CHLORIDE 0.9 % IV SOLN
Freq: Once | INTRAVENOUS | Status: AC
  Administered 2016-12-05: 15:00:00 via INTRAVENOUS

## 2016-12-05 MED ORDER — TRAMADOL HCL 50 MG PO TABS: 50.0000 mg | ORAL_TABLET | Freq: Four times a day (QID) | ORAL | 1 refills | Status: DC | PRN

## 2016-12-05 MED ORDER — DEXAMETHASONE 4 MG PO TABS: 20.0000 mg | ORAL_TABLET | ORAL | 1 refills | Status: DC

## 2016-12-05 NOTE — Telephone Encounter (Signed)
Gave patient AVS and calender per 4/23 los.

## 2016-12-05 NOTE — Patient Instructions (Signed)
Pamidronate injection What is this medicine? PAMIDRONATE (pa mi DROE nate) slows calcium loss from bones. It is used to treat high calcium blood levels from cancer or Paget's disease. It is also used to treat bone pain and prevent fractures from certain cancers that have spread to the bone. This medicine may be used for other purposes; ask your health care provider or pharmacist if you have questions. COMMON BRAND NAME(S): Aredia What should I tell my health care provider before I take this medicine? They need to know if you have any of these conditions: -aspirin-sensitive asthma -dental disease -kidney disease -an unusual or allergic reaction to pamidronate, other medicines, foods, dyes, or preservatives -pregnant or trying to get pregnant -breast-feeding How should I use this medicine? This medicine is for infusion into a vein. It is given by a health care professional in a hospital or clinic setting. Talk to your pediatrician regarding the use of this medicine in children. This medicine is not approved for use in children. Overdosage: If you think you have taken too much of this medicine contact a poison control center or emergency room at once. NOTE: This medicine is only for you. Do not share this medicine with others. What if I miss a dose? This does not apply. What may interact with this medicine? -certain antibiotics given by injection -medicines for inflammation or pain like ibuprofen, naproxen -some diuretics like bumetanide, furosemide -cyclosporine -parathyroid hormone -tacrolimus -teriparatide -thalidomide This list may not describe all possible interactions. Give your health care provider a list of all the medicines, herbs, non-prescription drugs, or dietary supplements you use. Also tell them if you smoke, drink alcohol, or use illegal drugs. Some items may interact with your medicine. What should I watch for while using this medicine? Visit your doctor or health care  professional for regular checkups. It may be some time before you see the benefit from this medicine. Do not stop taking your medicine unless your doctor tells you to. Your doctor may order blood tests or other tests to see how you are doing. Women should inform their doctor if they wish to become pregnant or think they might be pregnant. There is a potential for serious side effects to an unborn child. Talk to your health care professional or pharmacist for more information. You should make sure that you get enough calcium and vitamin D while you are taking this medicine. Discuss the foods you eat and the vitamins you take with your health care professional. Some people who take this medicine have severe bone, joint, and/or muscle pain. This medicine may also increase your risk for a broken thigh bone. Tell your doctor right away if you have pain in your upper leg or groin. Tell your doctor if you have any pain that does not go away or that gets worse. What side effects may I notice from receiving this medicine? Side effects that you should report to your doctor or health care professional as soon as possible: -allergic reactions like skin rash, itching or hives, swelling of the face, lips, or tongue -black or tarry stools -changes in vision -eye inflammation, pain -high blood pressure -jaw pain, especially burning or cramping -muscle weakness -numb, tingling pain -swelling of feet or hands -trouble passing urine or change in the amount of urine -unable to move easily Side effects that usually do not require medical attention (report to your doctor or health care professional if they continue or are bothersome): -bone, joint, or muscle pain -constipation -dizzy, drowsy -  fever -headache -loss of appetite -nausea, vomiting -pain at site where injected This list may not describe all possible side effects. Call your doctor for medical advice about side effects. You may report side effects to  FDA at 1-800-FDA-1088. Where should I keep my medicine? This drug is given in a hospital or clinic and will not be stored at home. NOTE: This sheet is a summary. It may not cover all possible information. If you have questions about this medicine, talk to your doctor, pharmacist, or health care provider.  2018 Elsevier/Gold Standard (2011-01-28 08:49:49)

## 2016-12-06 LAB — KAPPA/LAMBDA LIGHT CHAINS
IG KAPPA FREE LIGHT CHAIN: 21.6 mg/L — AB (ref 3.3–19.4)
Ig Lambda Free Light Chain: 193.1 mg/L — ABNORMAL HIGH (ref 5.7–26.3)
KAPPA/LAMBDA FLC RATIO: 0.11 — AB (ref 0.26–1.65)

## 2016-12-09 LAB — MULTIPLE MYELOMA PANEL, SERUM
ALBUMIN/GLOB SERPL: 1.9 — AB (ref 0.7–1.7)
ALPHA 1: 0.3 g/dL (ref 0.0–0.4)
Albumin SerPl Elph-Mcnc: 3.6 g/dL (ref 2.9–4.4)
Alpha2 Glob SerPl Elph-Mcnc: 0.6 g/dL (ref 0.4–1.0)
B-Globulin SerPl Elph-Mcnc: 0.8 g/dL (ref 0.7–1.3)
Gamma Glob SerPl Elph-Mcnc: 0.4 g/dL (ref 0.4–1.8)
Globulin, Total: 2 g/dL — ABNORMAL LOW (ref 2.2–3.9)
IGA/IMMUNOGLOBULIN A, SERUM: 36 mg/dL — AB (ref 64–422)
IGM (IMMUNOGLOBIN M), SRM: 6 mg/dL — AB (ref 26–217)
IgG, Qn, Serum: 387 mg/dL — ABNORMAL LOW (ref 700–1600)
Total Protein: 5.6 g/dL — ABNORMAL LOW (ref 6.0–8.5)

## 2016-12-29 ENCOUNTER — Other Ambulatory Visit: Payer: Self-pay | Admitting: *Deleted

## 2016-12-29 DIAGNOSIS — C9000 Multiple myeloma not having achieved remission: Secondary | ICD-10-CM

## 2016-12-29 MED ORDER — LENALIDOMIDE 15 MG PO CAPS: 15.0000 mg | ORAL_CAPSULE | Freq: Every day | ORAL | 0 refills | Status: DC

## 2016-12-30 ENCOUNTER — Other Ambulatory Visit: Payer: Self-pay | Admitting: Hematology

## 2016-12-30 NOTE — Progress Notes (Signed)
[facility]  [phone] [phone]  Clinic Follow Up Note   Patient Care Team: Kristin Shanks, MD as PCP - General (Family Medicine) 01/02/2017  CHIEF COMPLAINTS:  Follow up Multiple Myeloma      Multiple myeloma ([place])   10/18/2016 - 10/26/2016 Hospital Admission    Pt was admitted for symptomatic hypercalcemia, AKI, bone marrow biopsy done during hospital stay, and she received Aredia       10/19/2016 Tumor Marker    SPEP showed M-protein 0.4g/dl, serum lambda light chain 5362, ratio 0.00      10/25/2016 Bone Marrow Biopsy     Overall, the marrow is hypercellular with increased lambda-restricted plasma cells (62% aspirate, 90% CD138). There is partial expression of CD20, but no monoclonal B-cell population is detected by flow cytometry.       10/25/2016 Initial Diagnosis    Multiple myeloma ([place])      10/25/2016 Miscellaneous    Cytogenetics and FISH showed loss of 13q, which is intermediate risk for MM       11/10/2016 -  Chemotherapy    Revlimid 10 mg (increased to 42m on cycle 2) daily, 3 weeks on, and one week off, dexamethasone 20 mg weekly        HISTORY OF PRESENTING ILLNESS (10/20/2016):  83year old African-American female, with past medical history of hypertension, chronic back pain, see Kristin [name] stage III, who presents with altered mental status. She was admitted for hypercalcemia. I was called for consult to rule out multiple myeloma.   The patient is joined by family today. They help to report the patient's history. The patient has been lethargic and confused for the past week. She has issues with mobility at this time. She is constipated.   The family reports the patient fell last October, and her PCP suspected she broke her back. The patient received a cortisone injection in January, which was followed by onset of pain and decreased mobility. The patient was subsequently admitted [name], where the family reports  they discovered she was being given someone else's medicine. This led to the patient's confused mental status and difficulty with ambulation. The patient fell again while in [name], and she was brought back to home by her family. She has 24/7 care at home. In the ED, she was found to have calcium more than 15, she was admitted for further management. She received calcitonin, her calcium came down to 12.7 today. Her SPEP was positive for M protein-0.4 g/dl, possible represent a monoclonal gammopathy. Her 24-hour urine light chains are pending. Her PTH was normal at 20, PTH-rp is pending.   CURRENT THERAPY:  1. RD (Revlimid 10 mg daily, increased to 137mfrom cycle 3, 3 weeks on, one week off, and weekly dexamethasone 2061m started on 11/10/2016 2. Aredia 61m49mery 4 weeks, started on 10/24/2016 for hypercalcemia. Switch to Xgeva injections every 4 weeks starting 01/02/17  INTERIM HISTORY: ChriTAILYN [id number]. female is here for follow up accompanied by her family. She presents today with her daughter. She is doing doing bad but she reports pain on her right hip. She says is started after the weather changed. Her daughter says she took tramadol and now is taking oxycodone every 6 hours. This is worse than the back pain. There was no Xray done. The pain is exacerbated when she moves from sitting to standing. She has a "crick" in her right neck. She has not been waling much lately due to  pain her daughter reports. She reports no other pain. She has only been on Oxycodone for 2 weeks and it is not lasting long enough to help the pain. Remeron is helping her a lot, her appetite is much better. She has no recent falls. Her daughter reports her PT has not gone well due to pain. Her daughter wants her to get out the house more. She has not been able to go to [place] in a year and a half. Denies fever. She is concerned about her right hip.    MEDICAL HISTORY:  Past Medical History:  Diagnosis Date    . Allergy   . Arthritis   . Chronic kidney disease (CKD), stage III (moderate)    [facility] 09/23/2016  . Depression    Risperdal "twice/day" (09/23/2016)  . Glaucoma   . Hiatal hernia   . Hypertension   . Intractable back pain    /notes 09/23/2016  . Obesity   . Osteoporosis    vertebral fracture in 2012  . Upper GI bleed 04/2004   secondary to Mallory-Weiss tear/notes 12/28/2010  . Vertigo     SURGICAL HISTORY: Past Surgical History:  Procedure Laterality Date  . ABDOMINAL HYSTERECTOMY  1974   partial  . APPENDECTOMY     as a child/notes 12/28/2010  . BACK SURGERY    . BREAST BIOPSY Left 1999   [facility] 12/28/2010  . CATARACT EXTRACTION, BILATERAL Bilateral 2000s  . ESOPHAGOGASTRODUODENOSCOPY  04/2004   [facility] 12/28/2010  . KYPHOPLASTY  05/2011   T10/notes 05/25/2011  . LAPAROSCOPIC CHOLECYSTECTOMY  10/2007   [facility] 12/14/2010    SOCIAL HISTORY: Social History   Social History  . Marital status: Widowed    Spouse name: N/A  . Number of children: N/A  . Years of education: N/A   Occupational History  . Not on file.   Social History Main Topics  . Smoking status: Never Smoker  . Smokeless tobacco: Never Used  . Alcohol use Yes     Comment: 09/23/2016 "glass of wine a few times/year"  . Drug use: No  . Sexual activity: No   Other Topics Concern  . Not on file   Social History Narrative  . No narrative on file    FAMILY HISTORY: Family History  Problem Relation Age of Onset  . Hypertension Mother   . Osteoporosis Mother   . CVA Mother   . Cancer Neg Hx     ALLERGIES:  is allergic to alendronate; penicillins; and sulfonamide derivatives.  MEDICATIONS:  Current Outpatient Prescriptions  Medication Sig Dispense Refill  . amLODipine (NORVASC) 5 MG tablet Take 1 tablet (5 mg total) by mouth daily. 30 tablet 0  . bisacodyl (DULCOLAX) 10 MG suppository Place 1 suppository (10 mg total) rectally daily as needed for moderate constipation. 12 suppository 0  .  dexamethasone (DECADRON) 4 MG tablet Take 5 tablets (20 mg total) by mouth once a week. 30 tablet 1  . feeding supplement, ENSURE ENLIVE, (ENSURE ENLIVE) LIQD Take 237 mLs by mouth 2 (two) times daily between meals. 237 mL 12  . lenalidomide (REVLIMID) 15 MG capsule Take 1 capsule (15 mg total) by mouth daily. Take 15 mg daily for  21  Days  ,  Off  7  Days. 21 capsule 0  . magnesium oxide (MAG-OX) 400 (241.3 Mg) MG tablet Take 1 tablet (400 mg total) by mouth daily. 7 tablet 0  . meclizine (ANTIVERT) 25 MG tablet TAKE ONE TABLET BY MOUTH THREE TIMES DAILY  AS NEEDED (Patient taking differently: Take 25 mg by mouth 3 (three) times daily as needed for dizziness. TAKE ONE TABLET BY MOUTH THREE TIMES DAILY AS NEEDED) 90 tablet 0  . mirtazapine (REMERON) 7.5 MG tablet Take 1 tablet (7.5 mg total) by mouth at bedtime. 30 tablet 0  . nitrofurantoin (MACRODANTIN) 50 MG capsule     . ondansetron (ZOFRAN ODT) 4 MG disintegrating tablet Take 1-2 tablets (4-8 mg total) by mouth every 8 (eight) hours as needed for nausea or vomiting. 20 tablet 2  . polyethylene glycol powder (GLYCOLAX/MIRALAX) powder Take 17 g by mouth daily.    . Oxycodone HCl 10 MG TABS Take 1 tablet (10 mg total) by mouth every 4 (four) hours. 90 tablet 0  . RESTASIS 0.05 % ophthalmic emulsion Place 1 drop into both eyes daily.     . traMADol (ULTRAM) 50 MG tablet Take 1 tablet (50 mg total) by mouth every 6 (six) hours as needed. (Patient not taking: Reported on 01/02/2017) 90 tablet 1   No current facility-administered medications for this visit.     REVIEW OF SYSTEMS:  Constitutional: Denies fevers, chills or abnormal night sweats (+) decreased fatigue and increased strength (+) improved appetite Eyes: Denies blurriness of vision, double vision or watery eyes Ears, nose, mouth, throat, and face: Denies mucositis or sore throat (+) vertigo Respiratory: Denies cough, dyspnea or wheezes Cardiovascular: Denies palpitation, chest discomfort  or lower extremity swelling Gastrointestinal:  Denies heartburn or change in bowel habits (+) mild nausea and vomiting, (+) dysuria Skin: Denies abnormal skin rashes Lymphatics: Denies new lymphadenopathy or easy bruising Neurological:Denies numbness, tingling or new weaknesses  Behavioral/Psych: Mood is stable, no new changes  Musculoskeletal: (+) back pain (+) severe right hip pain  All other systems were reviewed with the patient and are negative.  PHYSICAL EXAMINATION: ECOG PERFORMANCE STATUS: 3  Vitals:   01/02/17 1023  BP: (!) 151/74  Pulse: (!) 112  Resp: 18  Temp: 99.1 F (37.3 C)   Filed Weights   01/02/17 1023  Weight: 141 lb (64 kg)     GENERAL:alert, no distress and comfortable, sitting in wheelchair  SKIN: skin color, texture, turgor are normal, no rashes or significant lesions EYES: normal, conjunctiva are pink and non-injected, sclera clear OROPHARYNX:no exudate, no erythema and lips, buccal mucosa, and tongue normal  NECK: supple, thyroid normal size, non-tender, without nodularity LYMPH:  no palpable lymphadenopathy in the cervical, axillary or inguinal LUNGS: clear to auscultation and percussion with normal breathing effort HEART: regular rate & rhythm and no murmurs and no lower extremity edema ABDOMEN:abdomen soft, non-tender and normal bowel sounds Musculoskeletal:no cyanosis of digits and no clubbing  PSYCH: alert & oriented x 3 with fluent speech NEURO: no focal motor/sensory deficits  LABORATORY DATA:  I have reviewed the data as listed CBC Latest Ref Rng & Units 01/02/2017 12/05/2016 11/17/2016  WBC 3.9 - 10.3 10e3/uL 4.3 4.9 6.3  Hemoglobin 11.6 - 15.9 g/dL 9.2(L) 9.2(L) 8.6(L)  Hematocrit 34.8 - 46.6 % 28.0(L) 28.0(L) 26.9(L)  Platelets 145 - 400 10e3/uL 189 195 135(L)   CMP Latest Ref Rng & Units 01/02/2017 12/05/2016 12/05/2016  Glucose 70 - 140 mg/dl 199(H) 117 -  BUN 7.0 - 26.0 mg/dL 17.2 11.5 -  Creatinine 0.6 - 1.1 mg/dL 1.1 0.9 -  Sodium  136 - 145 mEq/L 142 141 -  Potassium 3.5 - 5.1 mEq/L 4.3 4.0 -  Chloride 101 - 111 mmol/L - - -  CO2 22 - 29  mEq/L 22 24 -  Calcium 8.4 - 10.4 mg/dL 8.6 9.9 -  Total Protein 6.4 - 8.3 g/dL 5.5(L) 5.8(L) 5.6(L)  Total Bilirubin 0.20 - 1.20 mg/dL 0.56 0.62 -  Alkaline Phos 40 - 150 U/L 153(H) 142 -  AST 5 - 34 U/L 19 12 -  ALT 0 - 55 U/L 18 <6 -    MM LAB: M-protein (g/dl) 10/19/2016: 0.4 11/01/2016: Not Det 12/05/16: Not Det  Serum kappa, lamda light chains and ratio (mg/L) 10/21/2016: 14.8, 5362, 0.00 11/01/2016: 1.49, 10.88, 0.14 12/05/2016: 2.16, 19.31, 0.11   24 hour urine (total protein, free lambda, ration) 10/21/2016: 1463, 1570, 0.06  PATHOLOGY: Interpretation 10/25/2016 Bone Marrow Flow Cytometry - NO MONOCLONAL B-CELL OR PHENOTYPICALLY ABERRANT T-CELL POPULATION IDENTIFIED.  Diagnosis 10/25/2016 Bone Marrow, Aspirate,Biopsy, and Clot - PLASMA CELL MYELOMA. - SEE COMMENT. PERIPHERAL BLOOD: - NORMOCYTIC ANEMIA. Diagnosis Note Overall, the marrow is hypercellular with increased lambda-restricted plasma cells (62% aspirate, 90% CD138). There is partial expression of CD20, but no monoclonal B-cell population is detected by flow cytometry.     RADIOGRAPHIC STUDIES: I have personally reviewed the radiological images as listed and agreed with the findings in the report. Dg Hip Unilat W Or W/o Pelvis 2-3 Views Right  Result Date: 01/02/2017 CLINICAL DATA:  Right hip pain.  No injury.  Multiple myeloma . EXAM: DG HIP (WITH OR WITHOUT PELVIS) 2-3V RIGHT COMPARISON:  06/14/2016. FINDINGS: Degenerative changes lumbar spine and both hips. No evidence of fracture or dislocation. Multiple tiny lucencies are noted in the proximal femurs bilaterally. These changes may be related to multiple mild lumbar given the patient's history. Peripheral vascular calcification. Surgical clips right upper quadrant . IMPRESSION: 1. Degenerative changes lumbar spine and both hips. No evidence of fracture  or dislocation. 2. Diffuse osteopenia. Multiple tiny lucencies are noted throughout both proximal femurs. These may be secondary to the patient's known multiple myeloma. Electronically Signed   By: Marcello Moores  Register   On: 01/02/2017 13:52      ASSESSMENT & PLAN:  83 y.o.woman with HTN and CKD, presented with hypercalcemia, worsening anemia since 06/2016, s/p multiple fall, multiple hospitalization recently for back pain , CT scan showedT2 vertebral body compression fracture with mild height loss.  1. 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 -We previously discussed treatment options.  due to advanced age, multiple comorbidities, very limited performance status, I do not believe she is a candidate for bone marrow transplant -I previously recommend her dexamethasone and Revlimid. Start on dexamethasone 24m weekly and Revlimid 151mdaily; 3 weeks on, 1 week off, at adjusted for her renal function. She has been tolerating well, dose increased to 1552mrom cycle 3 -due to her Intermediated risk disease, she may need Velcade. Due to her advanced age and comorbilities, I am concerned about her tolerance to Velcade. I will try RD for a few cycles, if she does not have good response, I will add Velcade.  -The goal of therapy is palliative, for disease control, improve her quality of life and prolong life. - Repeat multiple myeloma panel every 4 weeks  -Labs reviewed and her M protein has been not  detected. And her Light chain is close to normal. She has responded to treatment well. Adequate to  continue treatment  -she has developed severe right hip pain, probably related to MM, will get a x-ray today    2. Anemia  -secondary to MM and Revlimid. -She received blood transfusion in hospital -will monitor CBC closely -consider blood transfusion if Hb<8.0 or symptomatic anemia with Hb 8-9 - Hemoglobin 9.2 today 12/05/16, not symptomatic, hold blood transfusion for now. - I will draw an extra tube for blood band during next lab just in case she needs a blood transfusion   3. Hypercalcemia  -She has received one dose of Aredia, hypercalcemia has nearly resolved now. We'll continue every 4 weeks for multiple myeloma, or more frequent if her hypercalcemia gets worse. -Her calcium is back to normal now. -will start back on calcium soon (pending)  4. Chronic CKD, stage II -much improved, EGFR 50-60 -Avoid nephrotoxic medicine or contrast   5. Severe back pain and right hip pain -T2 compression fracture -her tramadol has been changed to oxycodone due to her new right hip pain  -Due to pain I increased her Oxycodone alone frequency for every 4 hours.   -we discussed taking laxative due to increased oxycodone prescription -We discussed using walker and avoid weight bearing on her right hip -Will give referral to orthopedic surgery for further evaluation   6. HTN -controlled with medication -managed by PCP  7. Osteoporosis and fractures -Takes Boniva, which has been switched to Aredia  -Due to her poor IV access, I'll switch Aredia to Xgeva every 4 weeks, starting in 4 weeks -Start Xgeva 01/02/17 today   8. Goal of care discussion  -We previoulsy discussed the incurable nature of her MM, and the overall poor prognosis, especially if she does not have good response to chemotherapy or progress on chemo -The patient understands the goal of care is palliative. -I recommend DNR/DNI, she will think about it   9. Nausea - I previously recommended she take some type of nausea  medication. She is hesitant, but agreed totry Zofran 4-5m. She can take up to 3x daily. -She has constipation secondary to Zofran -I called in Compazine for her nausea today  10. Anorexia - I have previously encouraged her to eat snacks in between her meals -I previously recommended her to try mirtazapine 7.558mHS, may increase dose in future if needed  -Remeron is working well and her appetite has improved lately.   Plan -Refill Oxycodone 1076moday -Xray of right hip today -Orthopedic referral ASAP for right hip pain  -f/u in 2 weeks -labs, injection, f/u in 4 weeks  -start Xgeva today    All questions were answered. The patient knows to call the clinic with any problems, questions or concerns.  I spent 20 minutes counseling the patient face to face. The total time spent in the appointment was 30 minutes and more than 50% was on counseling.  This document serves as a record of services personally performed by YanTruitt MerleD. It was created on her behalf by AmoJoslyn Devon trained medical scribe. The creation of this record is based on the scribe's personal observations and the provider's statements to them. This document has been checked and approved by the attending provider.   I have reviewed the above documentation for accuracy and completeness and I agree with the above.   FenTruitt MerleD 01/02/2017

## 2017-01-02 ENCOUNTER — Ambulatory Visit (HOSPITAL_COMMUNITY)
Admission: RE | Admit: 2017-01-02 | Discharge: 2017-01-02 | Disposition: A | Discharge status: Home / Self Care | Payer: Medicare Other | Source: Ambulatory Visit | Attending: Hematology | Admitting: Hematology

## 2017-01-02 ENCOUNTER — Encounter: Payer: Self-pay | Admitting: Hematology

## 2017-01-02 ENCOUNTER — Encounter (HOSPITAL_BASED_OUTPATIENT_CLINIC_OR_DEPARTMENT_OTHER): Payer: Medicare Other

## 2017-01-02 ENCOUNTER — Ambulatory Visit (HOSPITAL_BASED_OUTPATIENT_CLINIC_OR_DEPARTMENT_OTHER): Payer: Medicare Other | Admitting: Hematology

## 2017-01-02 ENCOUNTER — Ambulatory Visit: Payer: Medicare Other

## 2017-01-02 ENCOUNTER — Telehealth: Payer: Self-pay | Admitting: Hematology

## 2017-01-02 VITALS — BP 151/74 | HR 112 | Temp 99.1°F | Resp 18 | Ht 60.0 in | Wt 141.0 lb

## 2017-01-02 DIAGNOSIS — C9000 Multiple myeloma not having achieved remission: Secondary | ICD-10-CM | POA: Diagnosis present

## 2017-01-02 DIAGNOSIS — M810 Age-related osteoporosis without current pathological fracture: Secondary | ICD-10-CM

## 2017-01-02 DIAGNOSIS — M545 Low back pain: Secondary | ICD-10-CM | POA: Diagnosis not present

## 2017-01-02 DIAGNOSIS — R110 Nausea: Secondary | ICD-10-CM | POA: Diagnosis not present

## 2017-01-02 DIAGNOSIS — M8588 Other specified disorders of bone density and structure, other site: Secondary | ICD-10-CM | POA: Diagnosis not present

## 2017-01-02 DIAGNOSIS — D6481 Anemia due to antineoplastic chemotherapy: Secondary | ICD-10-CM

## 2017-01-02 DIAGNOSIS — R630 Anorexia: Secondary | ICD-10-CM | POA: Diagnosis not present

## 2017-01-02 DIAGNOSIS — M47896 Other spondylosis, lumbar region: Secondary | ICD-10-CM | POA: Insufficient documentation

## 2017-01-02 DIAGNOSIS — D630 Anemia in neoplastic disease: Secondary | ICD-10-CM

## 2017-01-02 DIAGNOSIS — M8000XS Age-related osteoporosis with current pathological fracture, unspecified site, sequela: Secondary | ICD-10-CM

## 2017-01-02 DIAGNOSIS — N183 Chronic kidney disease, stage 3 (moderate): Secondary | ICD-10-CM | POA: Diagnosis not present

## 2017-01-02 DIAGNOSIS — M25551 Pain in right hip: Secondary | ICD-10-CM | POA: Diagnosis not present

## 2017-01-02 DIAGNOSIS — I10 Essential (primary) hypertension: Secondary | ICD-10-CM

## 2017-01-02 LAB — CBC WITH DIFFERENTIAL/PLATELET
BASO%: 0.9 % (ref 0.0–2.0)
Basophils Absolute: 0 10*3/uL (ref 0.0–0.1)
EOS%: 4 % (ref 0.0–7.0)
Eosinophils Absolute: 0.2 10*3/uL (ref 0.0–0.5)
HCT: 28 % — ABNORMAL LOW (ref 34.8–46.6)
HGB: 9.2 g/dL — ABNORMAL LOW (ref 11.6–15.9)
LYMPH%: 34.1 % (ref 14.0–49.7)
MCH: 35.5 pg — ABNORMAL HIGH (ref 25.1–34.0)
MCHC: 32.8 g/dL (ref 31.5–36.0)
MCV: 108.1 fL — ABNORMAL HIGH (ref 79.5–101.0)
MONO#: 0.5 10*3/uL (ref 0.1–0.9)
MONO%: 12 % (ref 0.0–14.0)
NEUT%: 49 % (ref 38.4–76.8)
NEUTROS ABS: 2.1 10*3/uL (ref 1.5–6.5)
Platelets: 189 10*3/uL (ref 145–400)
RBC: 2.59 10*6/uL — AB (ref 3.70–5.45)
RDW: 17.8 % — ABNORMAL HIGH (ref 11.2–14.5)
WBC: 4.3 10*3/uL (ref 3.9–10.3)
lymph#: 1.5 10*3/uL (ref 0.9–3.3)

## 2017-01-02 LAB — COMPREHENSIVE METABOLIC PANEL
ALT: 18 U/L (ref 0–55)
AST: 19 U/L (ref 5–34)
Albumin: 3.3 g/dL — ABNORMAL LOW (ref 3.5–5.0)
Alkaline Phosphatase: 153 U/L — ABNORMAL HIGH (ref 40–150)
Anion Gap: 12 mEq/L — ABNORMAL HIGH (ref 3–11)
BILIRUBIN TOTAL: 0.56 mg/dL (ref 0.20–1.20)
BUN: 17.2 mg/dL (ref 7.0–26.0)
CHLORIDE: 107 meq/L (ref 98–109)
CO2: 22 meq/L (ref 22–29)
CREATININE: 1.1 mg/dL (ref 0.6–1.1)
Calcium: 8.6 mg/dL (ref 8.4–10.4)
EGFR: 51 mL/min/{1.73_m2} — ABNORMAL LOW (ref >90)
GLUCOSE: 199 mg/dL — AB (ref 70–140)
Potassium: 4.3 mEq/L (ref 3.5–5.1)
SODIUM: 142 meq/L (ref 136–145)
TOTAL PROTEIN: 5.5 g/dL — AB (ref 6.4–8.3)

## 2017-01-02 MED ORDER — OXYCODONE HCL 10 MG PO TABS: 10.0000 mg | ORAL_TABLET | ORAL | 0 refills | Status: DC

## 2017-01-02 MED ORDER — DENOSUMAB 120 MG/1.7ML ~~LOC~~ SOLN
120.0000 mg | Freq: Once | SUBCUTANEOUS | Status: AC
  Administered 2017-01-02: 120 mg via SUBCUTANEOUS
  Filled 2017-01-02: qty 1.7

## 2017-01-02 NOTE — Telephone Encounter (Signed)
Scheduled appt per 5/21 los.  Called [facility] - per Margarita Grizzle due to patient being a current patient in that office she said patient can call office and set up appt.

## 2017-01-02 NOTE — Patient Instructions (Signed)

## 2017-01-03 LAB — KAPPA/LAMBDA LIGHT CHAINS
IG LAMBDA FREE LIGHT CHAIN: 109 mg/L — AB (ref 5.7–26.3)
Ig Kappa Free Light Chain: 30.1 mg/L — ABNORMAL HIGH (ref 3.3–19.4)
Kappa/Lambda FluidC Ratio: 0.28 (ref 0.26–1.65)

## 2017-01-05 ENCOUNTER — Other Ambulatory Visit: Payer: Self-pay | Admitting: Orthopedic Surgery

## 2017-01-05 DIAGNOSIS — M25551 Pain in right hip: Secondary | ICD-10-CM

## 2017-01-05 LAB — MULTIPLE MYELOMA PANEL, SERUM
ALBUMIN SERPL ELPH-MCNC: 3.1 g/dL (ref 2.9–4.4)
ALPHA2 GLOB SERPL ELPH-MCNC: 0.6 g/dL (ref 0.4–1.0)
Albumin/Glob SerPl: 1.5 (ref 0.7–1.7)
Alpha 1: 0.3 g/dL (ref 0.0–0.4)
B-GLOBULIN SERPL ELPH-MCNC: 0.8 g/dL (ref 0.7–1.3)
GAMMA GLOB SERPL ELPH-MCNC: 0.5 g/dL (ref 0.4–1.8)
GLOBULIN, TOTAL: 2.2 g/dL (ref 2.2–3.9)
IgA, Qn, Serum: 110 mg/dL (ref 64–422)
IgG, Qn, Serum: 553 mg/dL — ABNORMAL LOW (ref 700–1600)
IgM, Qn, Serum: 7 mg/dL — ABNORMAL LOW (ref 26–217)
Total Protein: 5.3 g/dL — ABNORMAL LOW (ref 6.0–8.5)

## 2017-01-11 ENCOUNTER — Telehealth: Payer: Self-pay | Admitting: *Deleted

## 2017-01-11 NOTE — Telephone Encounter (Signed)
-----   Message from Truitt Merle, MD sent at 01/09/2017  5:26 PM EDT ----- Please call pt and let her know the x-ray result. No fracture, but she has myeloma related bone lesions in both femurs.  Truitt Merle  01/09/2017

## 2017-01-11 NOTE — Telephone Encounter (Signed)
Left message with daughter, Lucita Ferrara, via cell to call back to discuss x-ray results of her mother.

## 2017-01-13 ENCOUNTER — Other Ambulatory Visit: Payer: Medicare Other

## 2017-01-14 ENCOUNTER — Other Ambulatory Visit: Payer: Self-pay | Admitting: Hematology

## 2017-01-16 ENCOUNTER — Telehealth: Payer: Self-pay | Admitting: *Deleted

## 2017-01-16 ENCOUNTER — Telehealth: Payer: Self-pay | Admitting: Hematology

## 2017-01-16 ENCOUNTER — Ambulatory Visit: Payer: Medicare Other | Admitting: Hematology

## 2017-01-16 NOTE — Telephone Encounter (Signed)
Pt cancelled her appointment with [place] today. I called her daughter Lucita Ferrara and spoke with her. Due to her worsening bilateral hip pain, her PCP Dr. Jacelyn Grip has recommended hospice, which she enrolled last Friday. She is on morphine now, her pain has improved some, but it's still difficult for her to move around. I supported her decision about the hospice, to get her pain controlled better. I told Cecille Rubin it's OK to complete the current cycle of Revlimid, but I will not refill for her. I encourage her to keep [place] updated, if her pain controlled better and she changes her mind, and wants to have more MM treatment, she knows to contact us for appointment. Lucita Ferrara voiced good understanding and very much appreciated my call.  Truitt Merle  01/16/2017

## 2017-01-16 NOTE — Telephone Encounter (Signed)
Called & spoke with daughter regarding missed appt.  She reports they have called in hospice & nurse came out today.  She states pt was unable to do CT & was in a lot of pain & they are just trying to work things out.  They state they called this am & cancelled appts. Message to Dr Burr Medico.

## 2017-01-17 ENCOUNTER — Telehealth: Payer: Self-pay | Admitting: Hematology

## 2017-01-17 NOTE — Telephone Encounter (Signed)
Spoke with YF disregard 6/4 schedule message - no need to reschedule appointments - patient under hospice care.

## 2017-01-18 ENCOUNTER — Inpatient Hospital Stay (HOSPITAL_COMMUNITY)

## 2017-01-18 ENCOUNTER — Emergency Department (HOSPITAL_COMMUNITY)

## 2017-01-18 ENCOUNTER — Encounter (HOSPITAL_COMMUNITY): Payer: Self-pay | Admitting: Emergency Medicine

## 2017-01-18 ENCOUNTER — Inpatient Hospital Stay (HOSPITAL_COMMUNITY)
Admission: EM | Admit: 2017-01-18 | Discharge: 2017-01-23 | DRG: 470 | Disposition: A | Discharge status: Skilled Nursing Facility | Attending: Internal Medicine | Admitting: Internal Medicine

## 2017-01-18 DIAGNOSIS — M810 Age-related osteoporosis without current pathological fracture: Secondary | ICD-10-CM | POA: Diagnosis present

## 2017-01-18 DIAGNOSIS — Z7401 Bed confinement status: Secondary | ICD-10-CM | POA: Diagnosis not present

## 2017-01-18 DIAGNOSIS — H409 Unspecified glaucoma: Secondary | ICD-10-CM | POA: Diagnosis present

## 2017-01-18 DIAGNOSIS — Z7189 Other specified counseling: Secondary | ICD-10-CM

## 2017-01-18 DIAGNOSIS — C9000 Multiple myeloma not having achieved remission: Secondary | ICD-10-CM | POA: Diagnosis present

## 2017-01-18 DIAGNOSIS — B9620 Unspecified Escherichia coli [E. coli] as the cause of diseases classified elsewhere: Secondary | ICD-10-CM | POA: Diagnosis present

## 2017-01-18 DIAGNOSIS — Z79899 Other long term (current) drug therapy: Secondary | ICD-10-CM

## 2017-01-18 DIAGNOSIS — D62 Acute posthemorrhagic anemia: Secondary | ICD-10-CM | POA: Diagnosis not present

## 2017-01-18 DIAGNOSIS — Z9842 Cataract extraction status, left eye: Secondary | ICD-10-CM

## 2017-01-18 DIAGNOSIS — Z6830 Body mass index (BMI) 30.0-30.9, adult: Secondary | ICD-10-CM | POA: Diagnosis not present

## 2017-01-18 DIAGNOSIS — D539 Nutritional anemia, unspecified: Secondary | ICD-10-CM | POA: Diagnosis present

## 2017-01-18 DIAGNOSIS — E875 Hyperkalemia: Secondary | ICD-10-CM | POA: Diagnosis present

## 2017-01-18 DIAGNOSIS — E669 Obesity, unspecified: Secondary | ICD-10-CM | POA: Diagnosis present

## 2017-01-18 DIAGNOSIS — M84551A Pathological fracture in neoplastic disease, right femur, initial encounter for fracture: Secondary | ICD-10-CM | POA: Diagnosis present

## 2017-01-18 DIAGNOSIS — N183 Chronic kidney disease, stage 3 unspecified: Secondary | ICD-10-CM | POA: Diagnosis present

## 2017-01-18 DIAGNOSIS — Z515 Encounter for palliative care: Secondary | ICD-10-CM

## 2017-01-18 DIAGNOSIS — N390 Urinary tract infection, site not specified: Secondary | ICD-10-CM | POA: Diagnosis present

## 2017-01-18 DIAGNOSIS — R630 Anorexia: Secondary | ICD-10-CM | POA: Diagnosis not present

## 2017-01-18 DIAGNOSIS — Z9841 Cataract extraction status, right eye: Secondary | ICD-10-CM

## 2017-01-18 DIAGNOSIS — K5900 Constipation, unspecified: Secondary | ICD-10-CM | POA: Diagnosis present

## 2017-01-18 DIAGNOSIS — M25551 Pain in right hip: Secondary | ICD-10-CM | POA: Diagnosis present

## 2017-01-18 DIAGNOSIS — I10 Essential (primary) hypertension: Secondary | ICD-10-CM | POA: Diagnosis present

## 2017-01-18 DIAGNOSIS — D630 Anemia in neoplastic disease: Secondary | ICD-10-CM | POA: Diagnosis present

## 2017-01-18 DIAGNOSIS — I129 Hypertensive chronic kidney disease with stage 1 through stage 4 chronic kidney disease, or unspecified chronic kidney disease: Secondary | ICD-10-CM | POA: Diagnosis present

## 2017-01-18 DIAGNOSIS — D649 Anemia, unspecified: Secondary | ICD-10-CM | POA: Diagnosis not present

## 2017-01-18 DIAGNOSIS — S7291XA Unspecified fracture of right femur, initial encounter for closed fracture: Secondary | ICD-10-CM

## 2017-01-18 DIAGNOSIS — Z880 Allergy status to penicillin: Secondary | ICD-10-CM

## 2017-01-18 DIAGNOSIS — Z01818 Encounter for other preprocedural examination: Secondary | ICD-10-CM

## 2017-01-18 DIAGNOSIS — F329 Major depressive disorder, single episode, unspecified: Secondary | ICD-10-CM | POA: Diagnosis present

## 2017-01-18 DIAGNOSIS — M84459A Pathological fracture, hip, unspecified, initial encounter for fracture: Secondary | ICD-10-CM | POA: Diagnosis present

## 2017-01-18 LAB — BASIC METABOLIC PANEL
Anion gap: 9 (ref 5–15)
BUN: 37 mg/dL — AB (ref 6–20)
CALCIUM: 7.2 mg/dL — AB (ref 8.9–10.3)
CO2: 24 mmol/L (ref 22–32)
CREATININE: 1.11 mg/dL — AB (ref 0.44–1.00)
Chloride: 105 mmol/L (ref 101–111)
GFR calc Af Amer: 51 mL/min — ABNORMAL LOW (ref >60)
GFR, EST NON AFRICAN AMERICAN: 44 mL/min — AB (ref >60)
GLUCOSE: 153 mg/dL — AB (ref 65–99)
POTASSIUM: 5.4 mmol/L — AB (ref 3.5–5.1)
SODIUM: 138 mmol/L (ref 135–145)

## 2017-01-18 LAB — CBC WITH DIFFERENTIAL/PLATELET
BASOS PCT: 0 %
Basophils Absolute: 0 10*3/uL (ref 0.0–0.1)
EOS PCT: 0 %
Eosinophils Absolute: 0 10*3/uL (ref 0.0–0.7)
HCT: 26 % — ABNORMAL LOW (ref 36.0–46.0)
Hemoglobin: 8.6 g/dL — ABNORMAL LOW (ref 12.0–15.0)
Lymphocytes Relative: 30 %
Lymphs Abs: 2.1 10*3/uL (ref 0.7–4.0)
MCH: 33.7 pg (ref 26.0–34.0)
MCHC: 33.1 g/dL (ref 30.0–36.0)
MCV: 102 fL — AB (ref 78.0–100.0)
MONO ABS: 1.4 10*3/uL — AB (ref 0.1–1.0)
Monocytes Relative: 21 %
NEUTROS PCT: 49 %
Neutro Abs: 3.4 10*3/uL (ref 1.7–7.7)
PLATELETS: 165 10*3/uL (ref 150–400)
RBC: 2.55 MIL/uL — AB (ref 3.87–5.11)
RDW: 15.8 % — AB (ref 11.5–15.5)
WBC: 6.9 10*3/uL (ref 4.0–10.5)

## 2017-01-18 LAB — URINALYSIS, ROUTINE W REFLEX MICROSCOPIC
Bilirubin Urine: NEGATIVE
GLUCOSE, UA: NEGATIVE mg/dL
Ketones, ur: NEGATIVE mg/dL
Nitrite: NEGATIVE
PH: 5 (ref 5.0–8.0)
PROTEIN: 30 mg/dL — AB
SPECIFIC GRAVITY, URINE: 1.012 (ref 1.005–1.030)

## 2017-01-18 MED ORDER — OXYCODONE HCL 5 MG PO TABS
10.0000 mg | ORAL_TABLET | ORAL | Status: DC | PRN
  Administered 2017-01-19 – 2017-01-23 (×4): 10 mg via ORAL
  Filled 2017-01-18 (×4): qty 2

## 2017-01-18 MED ORDER — RISPERIDONE 0.5 MG PO TABS
0.5000 mg | ORAL_TABLET | Freq: Two times a day (BID) | ORAL | Status: DC
  Administered 2017-01-19 – 2017-01-23 (×9): 0.5 mg via ORAL
  Filled 2017-01-18 (×11): qty 1

## 2017-01-18 MED ORDER — MORPHINE SULFATE (PF) 2 MG/ML IV SOLN
1.0000 mg | INTRAVENOUS | Status: DC | PRN
  Administered 2017-01-19 (×5): 2 mg via INTRAVENOUS
  Administered 2017-01-19: 1 mg via INTRAVENOUS
  Administered 2017-01-20: 2 mg via INTRAVENOUS
  Filled 2017-01-18 (×8): qty 1

## 2017-01-18 MED ORDER — INSULIN ASPART 100 UNIT/ML ~~LOC~~ SOLN
0.0000 [IU] | SUBCUTANEOUS | Status: DC
  Administered 2017-01-19: 1 [IU] via SUBCUTANEOUS
  Administered 2017-01-19: 2 [IU] via SUBCUTANEOUS
  Administered 2017-01-20 – 2017-01-21 (×8): 1 [IU] via SUBCUTANEOUS
  Administered 2017-01-21: 2 [IU] via SUBCUTANEOUS
  Administered 2017-01-22 (×2): 1 [IU] via SUBCUTANEOUS

## 2017-01-18 MED ORDER — CEPHALEXIN 500 MG PO CAPS
500.0000 mg | ORAL_CAPSULE | Freq: Once | ORAL | Status: AC
  Administered 2017-01-18: 500 mg via ORAL
  Filled 2017-01-18: qty 1

## 2017-01-18 MED ORDER — ENSURE ENLIVE PO LIQD
237.0000 mL | Freq: Two times a day (BID) | ORAL | Status: DC
  Administered 2017-01-20: 237 mL via ORAL

## 2017-01-18 MED ORDER — MIRTAZAPINE 15 MG PO TABS
7.5000 mg | ORAL_TABLET | Freq: Every day | ORAL | Status: DC
  Administered 2017-01-19 – 2017-01-22 (×5): 7.5 mg via ORAL
  Filled 2017-01-18: qty 1
  Filled 2017-01-18: qty 0.5
  Filled 2017-01-18 (×4): qty 1

## 2017-01-18 MED ORDER — OXYCODONE HCL 5 MG PO TABS
10.0000 mg | ORAL_TABLET | ORAL | Status: DC
  Administered 2017-01-18 (×2): 10 mg via ORAL
  Filled 2017-01-18 (×2): qty 2

## 2017-01-18 MED ORDER — SENNOSIDES-DOCUSATE SODIUM 8.6-50 MG PO TABS
2.0000 | ORAL_TABLET | Freq: Every day | ORAL | Status: DC
  Administered 2017-01-20: 2 via ORAL
  Filled 2017-01-18 (×2): qty 2

## 2017-01-18 MED ORDER — MECLIZINE HCL 25 MG PO TABS: 25.0000 mg | ORAL_TABLET | Freq: Three times a day (TID) | ORAL | Status: DC | PRN

## 2017-01-18 MED ORDER — CYCLOSPORINE 0.05 % OP EMUL
1.0000 [drp] | Freq: Every day | OPHTHALMIC | Status: DC
  Administered 2017-01-19 – 2017-01-23 (×5): 1 [drp] via OPHTHALMIC
  Filled 2017-01-18 (×5): qty 1

## 2017-01-18 MED ORDER — BISACODYL 10 MG RE SUPP: 10.0000 mg | Freq: Every day | RECTAL | Status: DC | PRN

## 2017-01-18 MED ORDER — FLEET ENEMA 7-19 GM/118ML RE ENEM
1.0000 | ENEMA | Freq: Once | RECTAL | Status: AC | PRN
  Administered 2017-01-22: 1 via RECTAL
  Filled 2017-01-18: qty 1

## 2017-01-18 MED ORDER — MORPHINE SULFATE ER 15 MG PO TBCR
30.0000 mg | EXTENDED_RELEASE_TABLET | Freq: Two times a day (BID) | ORAL | Status: DC
  Administered 2017-01-19 – 2017-01-23 (×9): 30 mg via ORAL
  Filled 2017-01-18 (×10): qty 2

## 2017-01-18 MED ORDER — SODIUM CHLORIDE 0.9 % IV BOLUS (SEPSIS)
1000.0000 mL | Freq: Once | INTRAVENOUS | Status: AC
  Administered 2017-01-18: 1000 mL via INTRAVENOUS

## 2017-01-18 MED ORDER — FENTANYL CITRATE (PF) 100 MCG/2ML IJ SOLN
50.0000 ug | Freq: Once | INTRAMUSCULAR | Status: AC
  Administered 2017-01-18: 50 ug via INTRAVENOUS
  Filled 2017-01-18: qty 2

## 2017-01-18 MED ORDER — HYDROCODONE-ACETAMINOPHEN 5-325 MG PO TABS
1.0000 | ORAL_TABLET | Freq: Four times a day (QID) | ORAL | Status: DC | PRN
  Administered 2017-01-20 (×2): 2 via ORAL
  Administered 2017-01-22 (×3): 1 via ORAL
  Filled 2017-01-18: qty 2
  Filled 2017-01-18 (×3): qty 1
  Filled 2017-01-18: qty 2

## 2017-01-18 MED ORDER — DEXTROSE 5 % IV SOLN
1.0000 g | INTRAVENOUS | Status: DC
  Administered 2017-01-19 – 2017-01-22 (×5): 1 g via INTRAVENOUS
  Filled 2017-01-18 (×5): qty 10

## 2017-01-18 MED ORDER — FOSFOMYCIN TROMETHAMINE 3 G PO PACK: 3.0000 g | PACK | Freq: Once | ORAL | Status: DC

## 2017-01-18 NOTE — ED Notes (Signed)
IV team at bedside 

## 2017-01-18 NOTE — H&P (Signed)
History and Physical    Kristin [name] RCV:[id number] DOB: 1930-11-12 DOA: 01/18/2017  PCP: [name], MD   Patient coming from: Home  Chief Complaint: Severe right hip pain, blood spots in briefs  HPI: Kristin [name] is a 83 y.o. female with medical history significant for hypertension, chronic kidney disease stage III, depression, and multiple myeloma receiving palliative treatments, now presenting to the emergency department for evaluation of severe right hip pain and blood spots in her underwear. Patient is accompanied by her daughters to assist with the history. She has reportedly been bedbound for the past 2 weeks due to severe pain in her right hip that is worse with any movement. Prior to this, she was able to stand with a walker and take a few steps at a time on her good days. She developed right hip pain 2 weeks ago, described as severe, aching, localized to the right hip, worse with any movement, and better with rest. The right hip pain is continually progressed over the last 2 weeks. Last night, her daughter noted some pink spots that appeared to be blood in her underwear and was concern for vaginal bleeding. Patient denies any recent fevers or chills and denies any chest pain or palpitations. There has not been any significant dyspnea or cough.   ED Course: Upon arrival to the ED, patient is found to be afebrile, saturating adequately on room air, was soft but stable blood pressure and vitals otherwise stable. Chest x-ray is negative for acute cardiac pulmonary disease. CT of the hip demonstrated pathologic fracture in the right femoral neck with a large lytic lesion at the superior aspect of the lateral femoral head and diffuse myeloma. Chemistry panels notable for potassium of 5.4, BUN 37, and serum creatinine 1.11. CBC is notable for a stable macrocytic anemia with hemoglobin of 8.6 and MCV of 102.0. Urinalysis was grossly bloody and is suggestive of infection. Urine was sent for  culture, patient was treated with Keflex, fentanyl, oxycodone, and 1 L of normal saline in the ED. Orthopedic surgery was consulted by the ED physician and advised for medical admission over to [facility] Patient remained in stable in the ED, has not been in any respiratory distress, and will be admitted to the telemetry unit and was on Hospital for ongoing evaluation and management of a pathologic right hip fracture.  Review of Systems:  All other systems reviewed and apart from HPI, are negative.  Past Medical History:  Diagnosis Date  . Allergy   . Arthritis   . Cancer ([place])   . Chronic kidney disease (CKD), stage III (moderate)    Archie Endo 09/23/2016  . Depression    Risperdal "twice/day" (09/23/2016)  . Glaucoma   . Hiatal hernia   . Hypertension   . Intractable back pain    /notes 09/23/2016  . Obesity   . Osteoporosis    vertebral fracture in 2012  . Upper GI bleed 04/2004   secondary to Mallory-Weiss tear/notes 12/28/2010  . Vertigo     Past Surgical History:  Procedure Laterality Date  . ABDOMINAL HYSTERECTOMY  1974   partial  . APPENDECTOMY     as a child/notes 12/28/2010  . BACK SURGERY    . BREAST BIOPSY Left 1999   Archie Endo 12/28/2010  . CATARACT EXTRACTION, BILATERAL Bilateral 2000s  . ESOPHAGOGASTRODUODENOSCOPY  04/2004   Archie Endo 12/28/2010  . KYPHOPLASTY  05/2011   T10/notes 05/25/2011  . LAPAROSCOPIC CHOLECYSTECTOMY  10/2007   Archie Endo 12/14/2010  reports that she has never smoked. She has never used smokeless tobacco. She reports that she drinks alcohol. She reports that she does not use drugs.  Allergies  Allergen Reactions  . Alendronate Nausea Only and Other (See Comments)    Hip pain  . Penicillins Rash    Has patient had a PCN reaction causing immediate rash, facial/tongue/throat swelling, SOB or lightheadedness with hypotension: no Has patient had a PCN reaction causing severe rash involving mucus membranes or skin necrosis: no Has patient had a  PCN reaction that required hospitalization no Has patient had a PCN reaction occurring within the last 10 years:no If all of the above answers are "NO", then may proceed with Cephalosporin use.    . Sulfonamide Derivatives Itching and Rash    Family History  Problem Relation Age of Onset  . Hypertension Mother   . Osteoporosis Mother   . CVA Mother   . Cancer Neg Hx      Prior to Admission medications   Medication Sig Start Date End Date Taking? Authorizing Provider  amLODipine (NORVASC) 5 MG tablet Take 1 tablet (5 mg total) by mouth daily. 02/19/14  Yes Cherre Robins, PharmD  bisacodyl (DULCOLAX) 10 MG suppository Place 1 suppository (10 mg total) rectally daily as needed for moderate constipation. 06/21/16  Yes Debbe Odea, MD  dexamethasone (DECADRON) 4 MG tablet Take 5 tablets (20 mg total) by mouth once a week. 12/05/16  Yes Truitt Merle, MD  feeding supplement, ENSURE ENLIVE, (ENSURE ENLIVE) LIQD Take 237 mLs by mouth 2 (two) times daily between meals. 10/25/16  Yes Florencia Reasons, MD  magnesium oxide (MAG-OX) 400 (241.3 Mg) MG tablet Take 1 tablet (400 mg total) by mouth daily. 10/26/16  Yes Sheikh, Omair Latif, DO  meclizine (ANTIVERT) 25 MG tablet TAKE ONE TABLET BY MOUTH THREE TIMES DAILY AS NEEDED Patient taking differently: Take 25 mg by mouth 3 (three) times daily as needed for dizziness. TAKE ONE TABLET BY MOUTH THREE TIMES DAILY AS NEEDED 06/20/14  Yes Chipper Herb, MD  mirtazapine (REMERON) 7.5 MG tablet TAKE 1 TABLET (7.5 MG TOTAL) BY MOUTH AT BEDTIME. 01/16/17  Yes Truitt Merle, MD  morphine ([name]) 30 MG 12 hr tablet Take 30 mg by mouth every 12 (twelve) hours. 01/13/17  Yes [provider]  ondansetron (ZOFRAN ODT) 4 MG disintegrating tablet Take 1-2 tablets (4-8 mg total) by mouth every 8 (eight) hours as needed for nausea or vomiting. 11/17/16  Yes Truitt Merle, MD  Oxycodone HCl 10 MG TABS Take 1 tablet (10 mg total) by mouth every 4 (four) hours. 01/02/17  Yes Truitt Merle, MD   RESTASIS 0.05 % ophthalmic emulsion Place 1 drop into both eyes daily.  01/31/13  Yes [provider]  risperiDONE (RISPERDAL) 0.5 MG tablet Take 0.5 mg by mouth 2 (two) times daily.   Yes [provider]  sennosides-docusate sodium (SENOKOT-S) 8.6-50 MG tablet Take 2 tablets by mouth daily.   Yes [provider]  lenalidomide (REVLIMID) 15 MG capsule Take 1 capsule (15 mg total) by mouth daily. Take 15 mg daily for  21  Days  ,  Off  7  Days. 12/29/16   Truitt Merle, MD    Physical Exam: Vitals:   01/18/17 1945 01/18/17 2000 01/18/17 2015 01/18/17 2030  BP:  111/67  133/77  Pulse: 81 81 76 79  Resp: 15 19 16 19   Temp:      TempSrc:      SpO2: 100%  100% 100% 98%      Constitutional: NAD, calm, appears chronically-ill, frail Eyes: PERTLA, lids and conjunctivae normal ENMT: Mucous membranes are moist. Posterior pharynx clear of any exudate or lesions.   Neck: normal, supple, no masses, no thyromegaly Respiratory: clear to auscultation bilaterally, no wheezing, no crackles. Normal respiratory effort.   Cardiovascular: Rate ~110 and regular. No extremity edema. No significant JVD. Abdomen: No distension, soft, mild suprapubic tenderness. Bowel sounds normal.  Musculoskeletal: no clubbing / cyanosis. Right hip exquisitely tender with shortening and external rotation of RLE. Good cap refill in right foot and plantar- dorsiflexion intact.    Skin: no significant rashes, lesions, ulcers. Warm, dry, well-perfused. Neurologic: CN 2-12 grossly intact. Sensation intact, DTR normal. Strength 5/5 in all 4 limbs.  Psychiatric: Alert and oriented x 3. Pleasant and cooperative.     Labs on Admission: I have personally reviewed following labs and imaging studies  CBC:  Recent Labs Lab 01/18/17 1803  WBC 6.9  NEUTROABS 3.4  HGB 8.6*  HCT 26.0*  MCV 102.0*  PLT 188   Basic Metabolic Panel:  Recent Labs Lab 01/18/17 1803  NA 138  K 5.4*  CL 105  CO2 24    GLUCOSE 153*  BUN 37*  CREATININE 1.11*  CALCIUM 7.2*   GFR: CrCl cannot be calculated (Unknown ideal weight.). Liver Function Tests: No results for input(s): AST, ALT, ALKPHOS, BILITOT, PROT, ALBUMIN in the last 168 hours. No results for input(s): LIPASE, AMYLASE in the last 168 hours. No results for input(s): AMMONIA in the last 168 hours. Coagulation Profile: No results for input(s): INR, PROTIME in the last 168 hours. Cardiac Enzymes: No results for input(s): CKTOTAL, CKMB, CKMBINDEX, TROPONINI in the last 168 hours. BNP (last 3 results) No results for input(s): PROBNP in the last 8760 hours. HbA1C: No results for input(s): HGBA1C in the last 72 hours. CBG: No results for input(s): GLUCAP in the last 168 hours. Lipid Profile: No results for input(s): CHOL, HDL, LDLCALC, TRIG, CHOLHDL, LDLDIRECT in the last 72 hours. Thyroid Function Tests: No results for input(s): TSH, T4TOTAL, FREET4, T3FREE, THYROIDAB in the last 72 hours. Anemia Panel: No results for input(s): VITAMINB12, FOLATE, FERRITIN, TIBC, IRON, RETICCTPCT in the last 72 hours. Urine analysis:    Component Value Date/Time   COLORURINE YELLOW 01/18/2017 1524   APPEARANCEUR CLOUDY (A) 01/18/2017 1524   LABSPEC 1.012 01/18/2017 1524   PHURINE 5.0 01/18/2017 1524   GLUCOSEU NEGATIVE 01/18/2017 1524   HGBUR LARGE (A) 01/18/2017 1524   BILIRUBINUR NEGATIVE 01/18/2017 1524   KETONESUR NEGATIVE 01/18/2017 1524   PROTEINUR 30 (A) 01/18/2017 1524   UROBILINOGEN 0.2 10/18/2007 0945   NITRITE NEGATIVE 01/18/2017 1524   LEUKOCYTESUR LARGE (A) 01/18/2017 1524   Sepsis Labs: @LABRCNTIP (procalcitonin:4,lacticidven:4) )No results found for this or any previous visit (from the past 240 hour(s)).   Radiological Exams on Admission: Ct Hip Right Wo Contrast  Result Date: 01/18/2017 CLINICAL DATA:  Chronic right hip pain in patient with history of multiple myeloma. No known injury. Initial encounter. EXAM: CT OF THE RIGHT  HIP WITHOUT CONTRAST TECHNIQUE: Multidetector CT imaging of the right hip was performed according to the standard protocol. Multiplanar CT image reconstructions were also generated. COMPARISON:  Plain films right hip 01/02/2017. FINDINGS: Bones/Joint/Cartilage The patient has a fracture through the right femoral neck. The fracture is mainly basicervical. There is a large lytic lesion in the superior aspect of the lateral femoral head. Extensive bony loss is present at the site of  the fracture consistent with a pathologic injury in this patient with a history of multiple myeloma. There may be an incomplete and nondisplaced fracture of the right inferior pubic ramus. No other fracture is identified. All imaged bones demonstrate findings consistent with diffuse myelomatous involvement with loss of medullary bone and extensive endosteal scalloping. Ligaments Suboptimally assessed by CT. Muscles and Tendons Intact. Soft tissues Some soft tissue edema about the right hip. Atherosclerotic calcifications are noted. IMPRESSION: Findings consistent with a pathologic fracture of the right femoral neck. A large lytic lesion in the superior aspect of the lateral femoral head is identified. Possible nondisplaced fracture of the right inferior pubic ramus. Diffuse myeloma. Electronically Signed   By: Inge Rise M.D.   On: 01/18/2017 15:59   Dg Chest Port 1 View  Result Date: 01/18/2017 CLINICAL DATA:  patient from home, hx multiple myeloma. Daughter noticed light pink spotting in brief beginning last night. Acute/chronic right hip pain. Denies abdominal pain, N/V/D, chest pain and SOB. Hx hypertension - hx chronic kidney disease EXAM: PORTABLE CHEST - 1 VIEW COMPARISON:  10/22/2016 FINDINGS: Low lung volumes.  No focal infiltrate or overt edema. Heart size upper limits normal.  Tortuous atheromatous aorta. No effusion. Degenerative spurring in bilateral shoulders. Changes of previous mid thoracic vertebral augmentation.  IMPRESSION: Low volumes.  No acute disease. Electronically Signed   By: Lucrezia Europe M.D.   On: 01/18/2017 20:19    EKG: Independently reviewed. Sinus rhythm, no peaked T's.   Assessment/Plan  1. Pathologic right hip fracture  - Pt presents with severe right hip pain, worsening over past 2 wks  - She is found to have pathologic fracture of right hip  - Prior to development of the hip pain, pt was able to stand with a walker and take a few steps on her good days; she has been bed-bound for past 2 wks secondary to right hip pain with movement  - Orthopedic surgery is consulting and much appreciated  - Based on the available data, Kristin [name] presents an estimated 3% risk probability of perioperative MI or cardiac arrest per Melburn Hake al  - Plan to keep NPO after midnight, continue gentle IVF hydration, control pain, address additional issues as below    2. Multiple myeloma  - Pt is following with oncology for palliative treatments  - She was treated with lenalidomide, currently on hold - Pt is followed by hospice services with hospice nurse reporting that she is DNR, but pt disputes, will keep Full Code for now - Palliative consultation requested   3. Hyperkalemia  - Serum potassium 5.4 on admission without EKG changes  - Likely secondary to renal insufficiency  - She was given a liter of NS in ED and is continued on NS infusion  - Monitor on telemetry, repeat potassium level q4h until wnl x2   4. Macrocytic anemia - Hgb is 8.6 on admission and stable relative to priors  - Likely secondary to multiple myeloma  - Type and screen will be performed   5. UTI  - UA is consistent with UTI  - Pt afebrile, no leukocytosis  - Urine has been sent for culture   - Continue empiric Rocephin pending culture data   6. Hypertension  - BP is soft but stable since admission  - Hold Norvasc given soft pressures and resume as appropriate   7. CKD stage III  - SCr is 1.11 on admission, slightly up  from prior of 0.9 - Continue IVF hydration,  repeat chem panel in am     DVT prophylaxis: Per surgical team; SCD's for now Code Status: Full; pt said she's still thinking about this  Family Communication: Daughters updated at bedside Disposition Plan: Admit to [facility] telemetry unit Consults called: Orthopedic surgery Admission status: Inpatient    Kristin Bulls, MD [facility] Pager [phone]  If 7PM-7AM, please contact night-coverage [email or web address] Password [facility]  01/18/2017, 8:49 PM

## 2017-01-18 NOTE — ED Notes (Addendum)
Attempted to make patient's primary hospice nurse, Apolonio Schneiders, aware patient is in ED. No answer. Will attempt to call again. [phone]

## 2017-01-18 NOTE — ED Provider Notes (Signed)
[facility] DEPT Provider Note   CSN: [id number] Arrival date & time: 01/18/17  1153     History   Chief Complaint Chief Complaint  Patient presents with  . Vaginal Bleeding    HPI Kristin [name] is a 83 y.o. female.  The history is provided by the patient, medical records and a relative. No language interpreter was used.  Hematuria  This is a new problem. The current episode started yesterday. The problem occurs constantly. The problem has not changed since onset.Pertinent negatives include no chest pain, no abdominal pain, no headaches and no shortness of breath. Nothing aggravates the symptoms. Nothing relieves the symptoms. She has tried nothing for the symptoms. The treatment provided no relief.  Hip Pain  This is a new problem. The current episode started more than 1 week ago (3 weeks). The problem occurs constantly. The problem has not changed since onset.Pertinent negatives include no chest pain, no abdominal pain, no headaches and no shortness of breath. The symptoms are aggravated by twisting. Nothing relieves the symptoms. She has tried nothing for the symptoms. The treatment provided no relief.    Past Medical History:  Diagnosis Date  . Allergy   . Arthritis   . Cancer ([place])   . Chronic kidney disease (CKD), stage III (moderate)    Archie Endo 09/23/2016  . Depression    Risperdal "twice/day" (09/23/2016)  . Glaucoma   . Hiatal hernia   . Hypertension   . Intractable back pain    /notes 09/23/2016  . Obesity   . Osteoporosis    vertebral fracture in 2012  . Upper GI bleed 04/2004   secondary to Mallory-Weiss tear/notes 12/28/2010  . Vertigo     Patient Active Problem List   Diagnosis Date Noted  . Goals of care, counseling/discussion 11/01/2016  . Multiple myeloma ([place]) 10/31/2016  . Acute encephalopathy 10/18/2016  . Hypercalcemia 10/18/2016  . Fall   . Anemia in neoplastic disease 09/23/2016  . Sepsis ([place]) 09/23/2016  . Back pain 09/23/2016  .  Intractable back pain 06/20/2016  . Other malaise and fatigue 11/26/2013  . Vertigo   . Obesity   . Arthritis of knee, left 07/09/2013  . Need for prophylactic vaccination and inoculation against influenza 07/09/2013  . Acute renal failure superimposed on stage 3 chronic kidney disease ([place]) 01/01/2013  . Osteoporosis with pathological fracture 01/01/2013  . HLD (hyperlipidemia) 01/01/2013  . HTN (hypertension) 01/01/2013  . Chronic back pain 01/01/2013  . Vertebral compression fracture ([place]) 01/01/2013  . Seasonal allergic rhinitis 01/01/2013    Past Surgical History:  Procedure Laterality Date  . ABDOMINAL HYSTERECTOMY  1974   partial  . APPENDECTOMY     as a child/notes 12/28/2010  . BACK SURGERY    . BREAST BIOPSY Left 1999   Archie Endo 12/28/2010  . CATARACT EXTRACTION, BILATERAL Bilateral 2000s  . ESOPHAGOGASTRODUODENOSCOPY  04/2004   Archie Endo 12/28/2010  . KYPHOPLASTY  05/2011   T10/notes 05/25/2011  . LAPAROSCOPIC CHOLECYSTECTOMY  10/2007   Archie Endo 12/14/2010    OB History    No data available       Home Medications    Prior to Admission medications   Medication Sig Start Date End Date Taking? Authorizing Provider  amLODipine (NORVASC) 5 MG tablet Take 1 tablet (5 mg total) by mouth daily. 02/19/14   Cherre Robins, PharmD  bisacodyl (DULCOLAX) 10 MG suppository Place 1 suppository (10 mg total) rectally daily as needed for moderate constipation. 06/21/16   Debbe Odea, MD  dexamethasone (DECADRON) 4 MG tablet Take 5 tablets (20 mg total) by mouth once a week. 12/05/16   Truitt Merle, MD  feeding supplement, ENSURE ENLIVE, (ENSURE ENLIVE) LIQD Take 237 mLs by mouth 2 (two) times daily between meals. 10/25/16   Florencia Reasons, MD  fentaNYL (DURAGESIC - DOSED MCG/HR) 25 MCG/HR patch Place 1 patch onto the skin every 3 (three) days. 01/07/17   [provider]  lenalidomide (REVLIMID) 15 MG capsule Take 1 capsule (15 mg total) by mouth daily. Take 15 mg daily for  21  Days  ,  Off   7  Days. 12/29/16   Truitt Merle, MD  magnesium oxide (MAG-OX) 400 (241.3 Mg) MG tablet Take 1 tablet (400 mg total) by mouth daily. 10/26/16   Raiford Noble Latif, DO  magnesium oxide (MAG-OX) 400 MG tablet Take 1 tablet by mouth daily. with food 12/18/16   [provider]  meclizine (ANTIVERT) 25 MG tablet TAKE ONE TABLET BY MOUTH THREE TIMES DAILY AS NEEDED Patient taking differently: Take 25 mg by mouth 3 (three) times daily as needed for dizziness. TAKE ONE TABLET BY MOUTH THREE TIMES DAILY AS NEEDED 06/20/14   Chipper Herb, MD  mirtazapine (REMERON) 7.5 MG tablet TAKE 1 TABLET (7.5 MG TOTAL) BY MOUTH AT BEDTIME. 01/16/17   Truitt Merle, MD  morphine ([name]) 30 MG 12 hr tablet Take 30 mg by mouth every 12 (twelve) hours. 01/13/17   [provider]  nitrofurantoin (MACRODANTIN) 50 MG capsule  10/30/16   [provider]  ondansetron (ZOFRAN ODT) 4 MG disintegrating tablet Take 1-2 tablets (4-8 mg total) by mouth every 8 (eight) hours as needed for nausea or vomiting. 11/17/16   Truitt Merle, MD  Oxycodone HCl 10 MG TABS Take 1 tablet (10 mg total) by mouth every 4 (four) hours. 01/02/17   Truitt Merle, MD  oxyCODONE-acetaminophen (PERCOCET) 10-325 MG tablet Take 1 tablet by mouth every 6 (six) hours as needed. 12/26/16   [provider]  polyethylene glycol powder (GLYCOLAX/MIRALAX) powder Take 17 g by mouth daily. 07/05/16   [provider]  RESTASIS 0.05 % ophthalmic emulsion Place 1 drop into both eyes daily.  01/31/13   [provider]  traMADol (ULTRAM) 50 MG tablet Take 1 tablet (50 mg total) by mouth every 6 (six) hours as needed. Patient not taking: Reported on 01/02/2017 12/05/16   Truitt Merle, MD    Family History Family History  Problem Relation Age of Onset  . Hypertension Mother   . Osteoporosis Mother   . CVA Mother   . Cancer Neg Hx     Social History Social History  Substance Use Topics  . Smoking status: Never Smoker  . Smokeless  tobacco: Never Used  . Alcohol use Yes     Comment: 09/23/2016 "glass of wine a few times/year"     Allergies   Alendronate; Penicillins; and Sulfonamide derivatives   Review of Systems Review of Systems  Constitutional: Negative for activity change, chills, diaphoresis, fatigue and fever.  HENT: Negative for congestion and rhinorrhea.   Eyes: Negative for visual disturbance.  Respiratory: Negative for cough, chest tightness, shortness of breath and stridor.   Cardiovascular: Negative for chest pain, palpitations and leg swelling.  Gastrointestinal: Negative for abdominal distention, abdominal pain, constipation, diarrhea, nausea and vomiting.  Genitourinary: Positive for hematuria. Negative for difficulty urinating, dysuria, flank pain, frequency, menstrual problem, pelvic pain, vaginal bleeding and vaginal discharge.  Musculoskeletal: Negative for back pain, neck pain  and neck stiffness.  Skin: Negative for rash and wound.  Neurological: Negative for dizziness, weakness, light-headedness, numbness and headaches.  Psychiatric/Behavioral: Negative for agitation and confusion.  All other systems reviewed and are negative.    Physical Exam Updated Vital Signs BP (!) 92/56   Pulse 87   Temp 98.7 F (37.1 C) (Oral)   Resp 15   SpO2 95%   Physical Exam  Constitutional: She is oriented to person, place, and time. She appears well-developed and well-nourished. No distress.  HENT:  Head: Normocephalic and atraumatic.  Mouth/Throat: Oropharynx is clear and moist. No oropharyngeal exudate.  Eyes: Conjunctivae and EOM are normal. Pupils are equal, round, and reactive to light.  Neck: Normal range of motion. Neck supple.  Cardiovascular: Normal rate, regular rhythm and intact distal pulses.   No murmur heard. Pulmonary/Chest: Effort normal and breath sounds normal. No stridor. No respiratory distress. She has no wheezes. She exhibits no tenderness.  Abdominal: Soft. There is no  tenderness.  Musculoskeletal: She exhibits tenderness. She exhibits no edema.       Right hip: She exhibits tenderness. She exhibits no deformity and no laceration.       Legs: Neurological: She is alert and oriented to person, place, and time. No sensory deficit. She exhibits normal muscle tone.  Skin: Skin is warm and dry. Capillary refill takes less than 2 seconds. No rash noted. No erythema.  Psychiatric: She has a normal mood and affect.  Nursing note and vitals reviewed.    ED Treatments / Results  Labs (all labs ordered are listed, but only abnormal results are displayed) Labs Reviewed  CBC WITH DIFFERENTIAL/PLATELET - Abnormal; Notable for the following:       Result Value   RBC 2.55 (*)    Hemoglobin 8.6 (*)    HCT 26.0 (*)    MCV 102.0 (*)    RDW 15.8 (*)    Monocytes Absolute 1.4 (*)    All other components within normal limits  BASIC METABOLIC PANEL - Abnormal; Notable for the following:    Potassium 5.4 (*)    Glucose, Bld 153 (*)    BUN 37 (*)    Creatinine, Ser 1.11 (*)    Calcium 7.2 (*)    GFR calc non Af Amer 44 (*)    GFR calc Af Amer 51 (*)    All other components within normal limits  URINALYSIS, ROUTINE W REFLEX MICROSCOPIC - Abnormal; Notable for the following:    APPearance CLOUDY (*)    Hgb urine dipstick LARGE (*)    Protein, ur 30 (*)    Leukocytes, UA LARGE (*)    Bacteria, UA MANY (*)    Squamous Epithelial / LPF 0-5 (*)    All other components within normal limits  URINE CULTURE    EKG  EKG Interpretation None       Radiology Ct Hip Right Wo Contrast  Result Date: 01/18/2017 CLINICAL DATA:  Chronic right hip pain in patient with history of multiple myeloma. No known injury. Initial encounter. EXAM: CT OF THE RIGHT HIP WITHOUT CONTRAST TECHNIQUE: Multidetector CT imaging of the right hip was performed according to the standard protocol. Multiplanar CT image reconstructions were also generated. COMPARISON:  Plain films right hip  01/02/2017. FINDINGS: Bones/Joint/Cartilage The patient has a fracture through the right femoral neck. The fracture is mainly basicervical. There is a large lytic lesion in the superior aspect of the lateral femoral head. Extensive bony loss is present at the  site of the fracture consistent with a pathologic injury in this patient with a history of multiple myeloma. There may be an incomplete and nondisplaced fracture of the right inferior pubic ramus. No other fracture is identified. All imaged bones demonstrate findings consistent with diffuse myelomatous involvement with loss of medullary bone and extensive endosteal scalloping. Ligaments Suboptimally assessed by CT. Muscles and Tendons Intact. Soft tissues Some soft tissue edema about the right hip. Atherosclerotic calcifications are noted. IMPRESSION: Findings consistent with a pathologic fracture of the right femoral neck. A large lytic lesion in the superior aspect of the lateral femoral head is identified. Possible nondisplaced fracture of the right inferior pubic ramus. Diffuse myeloma. Electronically Signed   By: Inge Rise M.D.   On: 01/18/2017 15:59    Procedures Procedures (including critical care time)  Medications Ordered in ED Medications  oxyCODONE (Oxy IR/ROXICODONE) immediate release tablet 10 mg (10 mg Oral Given 01/18/17 1628)  fentaNYL (SUBLIMAZE) injection 50 mcg (50 mcg Intravenous Given 01/18/17 1752)  cephALEXin (KEFLEX) capsule 500 mg (500 mg Oral Given 01/18/17 1751)  sodium chloride 0.9 % bolus 1,000 mL (1,000 mLs Intravenous New Bag/Given 01/18/17 1752)     Initial Impression / Assessment and Plan / ED Course  I have reviewed the triage vital signs and the nursing notes.  Pertinent labs & imaging results that were available during my care of the patient were reviewed by me and considered in my medical decision making (see chart for details).     Kristin [name] is a 83 y.o. female with a past medical history  significant for hypertension, or GI bleed, chronic kidney disease, and multiple myeloma on hospice who presents with blood in her diaper, dysuria, and worsening right hip pain. Patient says that beginning last night, she had pink blood in her diaper likely secondary to hematuria. Patient denies any blood in bowel movements. Patient says that she has been having outpatient workup for her right hip pain has worsened over the last 2 weeks. Her orthopedist is reportedly concerned about multiple myeloma now in her right hip. Patient says that she is supposed to be getting a CT scan of the right hip to further evaluate. Due to the pain worsening over the last several days, we will workup her right hip pain as well.  Patient denies any fevers, chills, nausea, vomiting, constipation, diarrhea. She does report having dysuria for the last 3 days. This is new for her.  History and exam are seen above. On exam, patient is tenderness in the right hip. Patient has normal sensation and strength in both legs. Patient has no significant interest in her back. No CVA tenderness. No abdominal tenderness. Lungs clear.  Patient will have urinalysis and labs performed to look for UTI and hematuria. Given lack of flank pain, abdominal pain, or back pain, doubt kidney stone. Patient will have imaging of the right hip as outlined to further evaluate for involvement of her right hip. Patient says her pain is mild to moderate while resting. Will hold on medications at this time.  Anticipate reassessment following workup.  5:37 PM Urinalysis shows evidence of urinary tract infection. Patient has large blood in the urine. Suspect this is the cause of her redness in the diaper. Patient will be treated with Keflex. Chart review shows that she has tolerated cephalosporins in the past.  CT shows evidence of pathologic fracture of the right hip. Suspect this is because the patient severe pain over the last 3 weeks. PT  is nonambulatory.   Orthopedics team called for management recommendations. Patient had fluids and pain medicine ordered. Screening blood work was also ordered.  Given patient's severe pain despite home pain medications, suspect patient may need admission for pain management of her new pathologic fracture. Anticipate following up in orthopedic recommendations. Patient will receive antibiotics for her urinary tract infection. She was likely dehydrated and we given fluids during workup.  Care transferred to Dr. Regenia Skeeter while awaiting for orthopedic management recs and lab testing to determine disposition.      Final Clinical Impressions(s) / ED Diagnoses   Final diagnoses:  Preoperative testing  Pathologic hip fracture (HCC)    Clinical Impression: 1. Preoperative testing   2. Pathologic hip fracture [facility])     Disposition: Care transferred to Dr. Regenia Skeeter while awaiting     Tegeler, Gwenyth Allegra, MD 01/18/17 2010

## 2017-01-18 NOTE — ED Triage Notes (Signed)
Per EMS, patient from home, hx multiple myeloma. Daughter noticed light pink spotting in brief beginning last night. Patient has no complaints. Hx chronic hip pain. Denies abdominal pain, N/V/D, chest pain and SOB.   BP 102/52 HR 104 RR 16 O2 98%

## 2017-01-18 NOTE — ED Notes (Signed)
Attempted an IV x 2 with no success. Will place an order for IV team. Burman Blacksmith, RN attempted once for an IV.

## 2017-01-18 NOTE — ED Provider Notes (Signed)
Discussed case with Dr. Percell Miller of orthopedics. He request patient be transferred to [facility] for possible surgery in the afternoon tomorrow. Nothing by mouth after midnight. He will have a long discussion with the family and patient in the morning about risks and benefits of surgery. Her lab work is unremarkable besides mild creatinine elevation and potassium of 5.4. She was given IV fluids. ECG without changes from her hyperkalemia. Dr Myna Hidalgo to admit.  ED ECG REPORT   Date: 01/18/2017  Rate: 80  Rhythm: normal sinus rhythm  QRS Axis: left  Intervals: normal  ST/T Wave abnormalities: nonspecific T wave changes  Conduction Disutrbances:none  Narrative Interpretation:   Old EKG Reviewed: unchanged  I have personally reviewed the EKG tracing and agree with the computerized printout as noted.   Results for orders placed or performed during the hospital encounter of 01/18/17  CBC with Differential  Result Value Ref Range   WBC 6.9 4.0 - 10.5 K/uL   RBC 2.55 (L) 3.87 - 5.11 MIL/uL   Hemoglobin 8.6 (L) 12.0 - 15.0 g/dL   HCT 26.0 (L) 36.0 - 46.0 %   MCV 102.0 (H) 78.0 - 100.0 fL   MCH 33.7 26.0 - 34.0 pg   MCHC 33.1 30.0 - 36.0 g/dL   RDW 15.8 (H) 11.5 - 15.5 %   Platelets 165 150 - 400 K/uL   Neutrophils Relative % 49 %   Lymphocytes Relative 30 %   Monocytes Relative 21 %   Eosinophils Relative 0 %   Basophils Relative 0 %   Neutro Abs 3.4 1.7 - 7.7 K/uL   Lymphs Abs 2.1 0.7 - 4.0 K/uL   Monocytes Absolute 1.4 (H) 0.1 - 1.0 K/uL   Eosinophils Absolute 0.0 0.0 - 0.7 K/uL   Basophils Absolute 0.0 0.0 - 0.1 K/uL   Smear Review MORPHOLOGY UNREMARKABLE   Basic metabolic panel  Result Value Ref Range   Sodium 138 135 - 145 mmol/L   Potassium 5.4 (H) 3.5 - 5.1 mmol/L   Chloride 105 101 - 111 mmol/L   CO2 24 22 - 32 mmol/L   Glucose, Bld 153 (H) 65 - 99 mg/dL   BUN 37 (H) 6 - 20 mg/dL   Creatinine, Ser 1.11 (H) 0.44 - 1.00 mg/dL   Calcium 7.2 (L) 8.9 - 10.3 mg/dL   GFR calc non  Af Amer 44 (L) >60 mL/min   GFR calc Af Amer 51 (L) >60 mL/min   Anion gap 9 5 - 15  Urinalysis, Routine w reflex microscopic  Result Value Ref Range   Color, Urine YELLOW YELLOW   APPearance CLOUDY (A) CLEAR   Specific Gravity, Urine 1.012 1.005 - 1.030   pH 5.0 5.0 - 8.0   Glucose, UA NEGATIVE NEGATIVE mg/dL   Hgb urine dipstick LARGE (A) NEGATIVE   Bilirubin Urine NEGATIVE NEGATIVE   Ketones, ur NEGATIVE NEGATIVE mg/dL   Protein, ur 30 (A) NEGATIVE mg/dL   Nitrite NEGATIVE NEGATIVE   Leukocytes, UA LARGE (A) NEGATIVE   RBC / HPF 6-30 0 - 5 RBC/hpf   WBC, UA TOO NUMEROUS TO COUNT 0 - 5 WBC/hpf   Bacteria, UA MANY (A) NONE SEEN   Squamous Epithelial / LPF 0-5 (A) NONE SEEN   Ct Hip Right Wo Contrast  Result Date: 01/18/2017 CLINICAL DATA:  Chronic right hip pain in patient with history of multiple myeloma. No known injury. Initial encounter. EXAM: CT OF THE RIGHT HIP WITHOUT CONTRAST TECHNIQUE: Multidetector CT imaging of the right  hip was performed according to the standard protocol. Multiplanar CT image reconstructions were also generated. COMPARISON:  Plain films right hip 01/02/2017. FINDINGS: Bones/Joint/Cartilage The patient has a fracture through the right femoral neck. The fracture is mainly basicervical. There is a large lytic lesion in the superior aspect of the lateral femoral head. Extensive bony loss is present at the site of the fracture consistent with a pathologic injury in this patient with a history of multiple myeloma. There may be an incomplete and nondisplaced fracture of the right inferior pubic ramus. No other fracture is identified. All imaged bones demonstrate findings consistent with diffuse myelomatous involvement with loss of medullary bone and extensive endosteal scalloping. Ligaments Suboptimally assessed by CT. Muscles and Tendons Intact. Soft tissues Some soft tissue edema about the right hip. Atherosclerotic calcifications are noted. IMPRESSION: Findings  consistent with a pathologic fracture of the right femoral neck. A large lytic lesion in the superior aspect of the lateral femoral head is identified. Possible nondisplaced fracture of the right inferior pubic ramus. Diffuse myeloma. Electronically Signed   By: Inge Rise M.D.   On: 01/18/2017 15:59   Dg Hip Unilat W Or W/o Pelvis 2-3 Views Right  Result Date: 01/02/2017 CLINICAL DATA:  Right hip pain.  No injury.  Multiple myeloma . EXAM: DG HIP (WITH OR WITHOUT PELVIS) 2-3V RIGHT COMPARISON:  06/14/2016. FINDINGS: Degenerative changes lumbar spine and both hips. No evidence of fracture or dislocation. Multiple tiny lucencies are noted in the proximal femurs bilaterally. These changes may be related to multiple mild lumbar given the patient's history. Peripheral vascular calcification. Surgical clips right upper quadrant . IMPRESSION: 1. Degenerative changes lumbar spine and both hips. No evidence of fracture or dislocation. 2. Diffuse osteopenia. Multiple tiny lucencies are noted throughout both proximal femurs. These may be secondary to the patient's known multiple myeloma. Electronically Signed   By: Marcello Moores  Register   On: 01/02/2017 13:52      Sherwood Gambler, MD 01/18/17 2000

## 2017-01-18 NOTE — Progress Notes (Signed)
Received report from ED RN, Learta Codding.  Patient coming in with Right hip stress fracture.  Possible surgery in the am.  Will Await arrival via [facility] from [place].

## 2017-01-18 NOTE — ED Notes (Signed)
Bed: WA04 Expected date:  Expected time:  Means of arrival:  Comments: EMS/elderly/spotting

## 2017-01-18 NOTE — ED Notes (Signed)
Dr. Sherry Ruffing at bedside.

## 2017-01-18 NOTE — ED Notes (Signed)
Family at bedside. 

## 2017-01-18 NOTE — Progress Notes (Signed)
Will Contact Dr Myna Hidalgo regarding code status and DNR to be clarified.  Sent page below to Dr Myna Hidalgo.  .[id number] Justine Null Lenoria: Spoke to hospice RN, who reports pt being DNR. Should she be on tele?  She is listed on our chart as Full code. ? Thank you.  Awaiting call back.

## 2017-01-18 NOTE — Progress Notes (Signed)
[facility] Liaison:  RN visit  Met with patient and family at bedside in the ED.  Patient states that she had some vaginal bleeding and intense R hip pain that she wanted addressed.  Per daughter, they did call [facility] primary RN, but didn't address that they were wanting/needing to come to the ED.    I spoke with RN at bedside who stated they were drawing blood, but just getting started for care.  I spoke with Dr. Sherry Ruffing, who advised that he is going to check for UTI and treat, if need be.  Also will be ordering CT of hip to address if possible mets to that area.  No needs at this time from patient/family or ED medical staff.   It is anticipated at this time that patient will be treated and released.  Will continue to monitor patient while in the hospital.  Thank you,  Edyth Gunnels, RN, [facility] Liaison [phone]  All hospital liaisons are now on [place].

## 2017-01-19 ENCOUNTER — Other Ambulatory Visit: Payer: Medicare Other

## 2017-01-19 ENCOUNTER — Encounter (HOSPITAL_COMMUNITY): Payer: Self-pay | Admitting: Anesthesiology

## 2017-01-19 ENCOUNTER — Encounter (HOSPITAL_COMMUNITY)
Admission: EM | Disposition: A | Discharge status: Skilled Nursing Facility | Payer: Self-pay | Source: Home / Self Care | Attending: Internal Medicine

## 2017-01-19 ENCOUNTER — Inpatient Hospital Stay (HOSPITAL_COMMUNITY): Admitting: Anesthesiology

## 2017-01-19 ENCOUNTER — Inpatient Hospital Stay (HOSPITAL_COMMUNITY)

## 2017-01-19 DIAGNOSIS — Z515 Encounter for palliative care: Secondary | ICD-10-CM

## 2017-01-19 HISTORY — PX: HIP ARTHROPLASTY: SHX981

## 2017-01-19 LAB — GLUCOSE, CAPILLARY
GLUCOSE-CAPILLARY: 112 mg/dL — AB (ref 65–99)
GLUCOSE-CAPILLARY: 116 mg/dL — AB (ref 65–99)
Glucose-Capillary: 106 mg/dL — ABNORMAL HIGH (ref 65–99)
Glucose-Capillary: 133 mg/dL — ABNORMAL HIGH (ref 65–99)
Glucose-Capillary: 134 mg/dL — ABNORMAL HIGH (ref 65–99)
Glucose-Capillary: 159 mg/dL — ABNORMAL HIGH (ref 65–99)

## 2017-01-19 LAB — CBC
HCT: 25 % — ABNORMAL LOW (ref 36.0–46.0)
HCT: 25.7 % — ABNORMAL LOW (ref 36.0–46.0)
HEMOGLOBIN: 8.1 g/dL — AB (ref 12.0–15.0)
Hemoglobin: 8.1 g/dL — ABNORMAL LOW (ref 12.0–15.0)
MCH: 33.5 pg (ref 26.0–34.0)
MCH: 33.2 pg (ref 26.0–34.0)
MCHC: 32.4 g/dL (ref 30.0–36.0)
MCHC: 31.5 g/dL (ref 30.0–36.0)
MCV: 103.3 fL — ABNORMAL HIGH (ref 78.0–100.0)
MCV: 105.3 fL — ABNORMAL HIGH (ref 78.0–100.0)
PLATELETS: 138 10*3/uL — AB (ref 150–400)
Platelets: 168 10*3/uL (ref 150–400)
RBC: 2.42 MIL/uL — ABNORMAL LOW (ref 3.87–5.11)
RBC: 2.44 MIL/uL — ABNORMAL LOW (ref 3.87–5.11)
RDW: 16 % — ABNORMAL HIGH (ref 11.5–15.5)
RDW: 16.4 % — ABNORMAL HIGH (ref 11.5–15.5)
WBC: 4.8 10*3/uL (ref 4.0–10.5)
WBC: 4.4 10*3/uL (ref 4.0–10.5)

## 2017-01-19 LAB — BASIC METABOLIC PANEL
Anion gap: 8 (ref 5–15)
BUN: 28 mg/dL — AB (ref 6–20)
CHLORIDE: 104 mmol/L (ref 101–111)
CO2: 23 mmol/L (ref 22–32)
CREATININE: 0.91 mg/dL (ref 0.44–1.00)
Calcium: 6.6 mg/dL — ABNORMAL LOW (ref 8.9–10.3)
GFR calc Af Amer: >60 mL/min (ref >60)
GFR calc non Af Amer: 56 mL/min — ABNORMAL LOW (ref >60)
Glucose, Bld: 106 mg/dL — ABNORMAL HIGH (ref 65–99)
Potassium: 4.4 mmol/L (ref 3.5–5.1)
SODIUM: 135 mmol/L (ref 135–145)

## 2017-01-19 LAB — SURGICAL PCR SCREEN
MRSA, PCR: NEGATIVE
Staphylococcus aureus: NEGATIVE

## 2017-01-19 LAB — POTASSIUM
POTASSIUM: 4.3 mmol/L (ref 3.5–5.1)
POTASSIUM: 4.3 mmol/L (ref 3.5–5.1)
Potassium: 4.5 mmol/L (ref 3.5–5.1)

## 2017-01-19 LAB — CREATININE, SERUM
CREATININE: 0.87 mg/dL (ref 0.44–1.00)
GFR calc Af Amer: >60 mL/min (ref >60)
GFR, EST NON AFRICAN AMERICAN: 59 mL/min — AB (ref >60)

## 2017-01-19 SURGERY — HEMIARTHROPLASTY, HIP, DIRECT ANTERIOR APPROACH, FOR FRACTURE
Anesthesia: Monitor Anesthesia Care | Site: Hip | Laterality: Right

## 2017-01-19 MED ORDER — ENOXAPARIN SODIUM 40 MG/0.4ML ~~LOC~~ SOLN: 40.0000 mg | SUBCUTANEOUS | Status: DC

## 2017-01-19 MED ORDER — PROPOFOL 10 MG/ML IV BOLUS
INTRAVENOUS | Status: DC | PRN
  Administered 2017-01-19: 30 mg via INTRAVENOUS
  Administered 2017-01-19 (×4): 20 mg via INTRAVENOUS

## 2017-01-19 MED ORDER — POVIDONE-IODINE 10 % EX SWAB: 2.0000 "application " | Freq: Once | CUTANEOUS | Status: DC

## 2017-01-19 MED ORDER — CHLORHEXIDINE GLUCONATE 4 % EX LIQD: 60.0000 mL | Freq: Once | CUTANEOUS | Status: DC

## 2017-01-19 MED ORDER — FENTANYL CITRATE (PF) 250 MCG/5ML IJ SOLN
INTRAMUSCULAR | Status: AC
  Filled 2017-01-19: qty 5

## 2017-01-19 MED ORDER — CEFAZOLIN SODIUM-DEXTROSE 2-4 GM/100ML-% IV SOLN
INTRAVENOUS | Status: AC
  Filled 2017-01-19: qty 100

## 2017-01-19 MED ORDER — LACTATED RINGERS IV SOLN
INTRAVENOUS | Status: DC
  Administered 2017-01-19 – 2017-01-20 (×2): via INTRAVENOUS

## 2017-01-19 MED ORDER — PROMETHAZINE HCL 25 MG/ML IJ SOLN: 6.2500 mg | INTRAMUSCULAR | Status: DC | PRN

## 2017-01-19 MED ORDER — BUPIVACAINE IN DEXTROSE 0.75-8.25 % IT SOLN
INTRATHECAL | Status: DC | PRN
  Administered 2017-01-19: 12 mg via INTRATHECAL

## 2017-01-19 MED ORDER — CEFAZOLIN SODIUM-DEXTROSE 2-4 GM/100ML-% IV SOLN
2.0000 g | INTRAVENOUS | Status: AC
  Administered 2017-01-19: 2 g via INTRAVENOUS

## 2017-01-19 MED ORDER — PROPOFOL 10 MG/ML IV BOLUS
INTRAVENOUS | Status: AC
  Filled 2017-01-19: qty 20

## 2017-01-19 MED ORDER — PHENYLEPHRINE HCL 10 MG/ML IJ SOLN
INTRAVENOUS | Status: DC | PRN
  Administered 2017-01-19: 50 ug/min via INTRAVENOUS

## 2017-01-19 MED ORDER — LACTATED RINGERS IV SOLN
INTRAVENOUS | Status: DC
  Administered 2017-01-19 (×2): via INTRAVENOUS

## 2017-01-19 MED ORDER — TRANEXAMIC ACID 1000 MG/10ML IV SOLN
1000.0000 mg | INTRAVENOUS | Status: AC
  Administered 2017-01-19: 1000 mg via INTRAVENOUS
  Filled 2017-01-19: qty 10

## 2017-01-19 MED ORDER — 0.9 % SODIUM CHLORIDE (POUR BTL) OPTIME
TOPICAL | Status: DC | PRN
Start: 2017-01-19 — End: 2017-01-19
  Administered 2017-01-19: 1000 mL

## 2017-01-19 MED ORDER — ALBUMIN HUMAN 5 % IV SOLN
INTRAVENOUS | Status: DC | PRN
  Administered 2017-01-19: 16:00:00 via INTRAVENOUS

## 2017-01-19 MED ORDER — POLYETHYLENE GLYCOL 3350 17 G PO PACK
17.0000 g | PACK | Freq: Every day | ORAL | Status: DC
  Administered 2017-01-19 – 2017-01-20 (×2): 17 g via ORAL
  Filled 2017-01-19 (×2): qty 1

## 2017-01-19 MED ORDER — ONDANSETRON HCL 4 MG/2ML IJ SOLN
INTRAMUSCULAR | Status: DC | PRN
  Administered 2017-01-19: 4 mg via INTRAVENOUS

## 2017-01-19 MED ORDER — FENTANYL CITRATE (PF) 100 MCG/2ML IJ SOLN
INTRAMUSCULAR | Status: DC | PRN
  Administered 2017-01-19 (×2): 50 ug via INTRAVENOUS

## 2017-01-19 MED ORDER — HYDROMORPHONE HCL 1 MG/ML IJ SOLN: 0.2500 mg | INTRAMUSCULAR | Status: DC | PRN

## 2017-01-19 SURGICAL SUPPLY — 46 items
BIT DRILL 7/64X5 DISP (BIT) ×3 IMPLANT
BLADE SAGITTAL 25.0X1.27X90 (BLADE) ×2 IMPLANT
BLADE SAGITTAL 25.0X1.27X90MM (BLADE) ×1
CAPT HIP HEMI 2 ×3 IMPLANT
CLOSURE STERI-STRIP 1/2X4 (GAUZE/BANDAGES/DRESSINGS) ×2
CLSR STERI-STRIP ANTIMIC 1/2X4 (GAUZE/BANDAGES/DRESSINGS) ×4 IMPLANT
COVER SURGICAL LIGHT HANDLE (MISCELLANEOUS) ×3 IMPLANT
DRAPE ORTHO SPLIT 77X108 STRL (DRAPES) ×6
DRAPE SURG ORHT 6 SPLT 77X108 (DRAPES) ×2 IMPLANT
DRAPE U-SHAPE 47X51 STRL (DRAPES) ×3 IMPLANT
DRSG MEPILEX BORDER 4X8 (GAUZE/BANDAGES/DRESSINGS) ×3 IMPLANT
DURAPREP 26ML APPLICATOR (WOUND CARE) ×3 IMPLANT
ELECT BLADE 4.0 EZ CLEAN MEGAD (MISCELLANEOUS) ×3
ELECT CAUTERY BLADE 6.4 (BLADE) ×3 IMPLANT
ELECT REM PT RETURN 9FT ADLT (ELECTROSURGICAL) ×3
ELECTRODE BLDE 4.0 EZ CLN MEGD (MISCELLANEOUS) ×1 IMPLANT
ELECTRODE REM PT RTRN 9FT ADLT (ELECTROSURGICAL) ×1 IMPLANT
FACESHIELD WRAPAROUND (MASK) IMPLANT
FACESHIELD WRAPAROUND OR TEAM (MASK) ×1 IMPLANT
GLOVE BIO SURGEON STRL SZ7.5 (GLOVE) ×8 IMPLANT
GLOVE BIOGEL PI IND STRL 8 (GLOVE) ×2 IMPLANT
GLOVE BIOGEL PI INDICATOR 8 (GLOVE) ×4
GOWN STRL REUS W/ TWL LRG LVL3 (GOWN DISPOSABLE) ×2 IMPLANT
GOWN STRL REUS W/ TWL XL LVL3 (GOWN DISPOSABLE) ×1 IMPLANT
GOWN STRL REUS W/TWL LRG LVL3 (GOWN DISPOSABLE) ×6
GOWN STRL REUS W/TWL XL LVL3 (GOWN DISPOSABLE) ×3
HIP CAPITATED HEMI 2 IMPLANT
KIT BASIN OR (CUSTOM PROCEDURE TRAY) ×3 IMPLANT
KIT ROOM TURNOVER OR (KITS) ×3 IMPLANT
MANIFOLD NEPTUNE II (INSTRUMENTS) ×3 IMPLANT
NS IRRIG 1000ML POUR BTL (IV SOLUTION) ×3 IMPLANT
PACK TOTAL JOINT (CUSTOM PROCEDURE TRAY) ×3 IMPLANT
PAD ARMBOARD 7.5X6 YLW CONV (MISCELLANEOUS) ×6 IMPLANT
PILLOW ABDUCTION HIP (SOFTGOODS) ×3 IMPLANT
RETRIEVER SUT HEWSON (MISCELLANEOUS) ×3 IMPLANT
SUT FIBERWIRE #2 38 REV NDL BL (SUTURE) ×6
SUT MNCRL AB 4-0 PS2 18 (SUTURE) ×3 IMPLANT
SUT MON AB 2-0 CT1 36 (SUTURE) ×3 IMPLANT
SUT VIC AB 0 CT1 27 (SUTURE) ×6
SUT VIC AB 0 CT1 27XBRD ANBCTR (SUTURE) IMPLANT
SUT VIC AB 1 CT1 27 (SUTURE) ×6
SUT VIC AB 1 CT1 27XBRD ANBCTR (SUTURE) ×1 IMPLANT
SUTURE FIBERWR#2 38 REV NDL BL (SUTURE) ×2 IMPLANT
TOWEL OR 17X24 6PK STRL BLUE (TOWEL DISPOSABLE) ×3 IMPLANT
TOWEL OR 17X26 10 PK STRL BLUE (TOWEL DISPOSABLE) ×3 IMPLANT
TRAY FOLEY CATH SILVER 14FR (SET/KITS/TRAYS/PACK) ×2 IMPLANT

## 2017-01-19 NOTE — Progress Notes (Signed)
Patient transported from [facility] via [facility] , arrived to the floor via stretcher, accompanied by daughter. Is being admitted for a right stress hip fracture with possible surgery on 6/7. Nothing by mouth is ordered. Significant history for multiple myeloma, chronic kidney disease III, depression and glaucoma. Patient and daughter taught about phone and nurse call bell. [place].  Cardiac monitor #2 applied, verified by nurse and NT. Ice pack placed on right side. Side rails up X 2. Bed alarm on. Safety maintained.  Will continue to monitor and follow MD orders.

## 2017-01-19 NOTE — Progress Notes (Signed)
Orthopedic Tech Progress Note Patient Details:  Kristin [name] [birth year] [id number]  Ortho Devices Type of Ortho Device: Knee Immobilizer Ortho Device/Splint Location: applied knee immobilizer on pt right leg.  pt tolerated well.   Overhead frame with trapeze bar was not installed due to pt Age. Ortho Device/Splint Interventions: Application, Adjustment   [name] 01/19/2017, 9:11 PM

## 2017-01-19 NOTE — Progress Notes (Addendum)
Pt will be going to OR short stay for hip surgery. Report called to OR at 1320.

## 2017-01-19 NOTE — Anesthesia Procedure Notes (Signed)
Spinal  Patient location during procedure: OR Start time: 01/19/2017 3:04 PM End time: 01/19/2017 3:14 PM Staffing Anesthesiologist: Duane Boston Performed: anesthesiologist  Preanesthetic Checklist Completed: patient identified, surgical consent, pre-op evaluation, timeout performed, IV checked, risks and benefits discussed and monitors and equipment checked Spinal Block Patient position: sitting Prep: DuraPrep Patient monitoring: cardiac monitor, continuous pulse ox and blood pressure Approach: midline Location: L2-3 Injection technique: single-shot Needle Needle type: Quincke  Needle gauge: 22 G Needle length: 9 cm Additional Notes Functioning IV was confirmed and monitors were applied. Sterile prep and drape, including hand hygiene and sterile gloves were used. The patient was positioned and the spine was prepped. The skin was anesthetized with lidocaine.  Free flow of clear CSF was obtained prior to injecting local anesthetic into the CSF.  The spinal needle aspirated freely following injection.  The needle was carefully withdrawn.  The patient tolerated the procedure well.

## 2017-01-19 NOTE — Progress Notes (Signed)
Pt came back from surgery at 1830 via bed. She complained of pain, morphine was administered at [place]. Will continue to monitor and treat per doctor order.

## 2017-01-19 NOTE — Progress Notes (Signed)
[facility] RN visit  This is a related and covered GIP hospital admission of 01/18/17 with [facility] DX of Multiple Myeloma per Dr. Antonieta Loveless.  Patient was admitted from home after complaints of R hip pain worsening over the past 2 weeks where Xrays show a pathologic fracture of the right femoral neck.    Chart reviewed and received report from bedside RN, then met with patient and daughter Lucita Ferrara at side.  Patient opens eyes but is sleepy due to a recent administration of pain medication and is in NAD at this time. Patient states "medications help but still in constant pain and ready to have this fixed".  Daughter confirms that patient and family have decided to have surgery to fix the hip this afternoon and that the patient has decided to move to Full Code status during the procedure.   Patient is NPO. Scheduled medications include [name] 43m tablet Q12hr, Risperidone 0.519mtablet BID. Patient is not receiving any continuous medications at this time and has taken 3 doses of Morphine 68m75mV prn q2hr on 01/19/17.    Family is appreciative of support and was informed that [facility] GIP visits will continue daily during patient's hospitalization.    Please let us [place] if we can be of any assistance. Thank you,  [facility]Gar [facility] [phone] hospital liaisons are now on [place]

## 2017-01-19 NOTE — Anesthesia Preprocedure Evaluation (Addendum)
Anesthesia Evaluation  Patient identified by MRN, date of birth, ID band Patient awake    Reviewed: Allergy & Precautions, NPO status , Patient's Chart, lab work & pertinent test results  Airway Mallampati: I  TM Distance: >3 FB Neck ROM: Full    Dental  (+) Edentulous Lower, Edentulous Upper, Dental Advisory Given   Pulmonary neg pulmonary ROS,    Pulmonary exam normal        Cardiovascular hypertension, Normal cardiovascular exam     Neuro/Psych PSYCHIATRIC DISORDERS Depression negative neurological ROS  negative psych ROS   GI/Hepatic Neg liver ROS, hiatal hernia,   Endo/Other  negative endocrine ROS  Renal/GU Renal InsufficiencyRenal diseasenegative Renal ROS  negative genitourinary   Musculoskeletal negative musculoskeletal ROS (+)   Abdominal   Peds negative pediatric ROS (+)  Hematology negative hematology ROS (+)   Anesthesia Other Findings   Reproductive/Obstetrics negative OB ROS                            Anesthesia Physical Anesthesia Plan  ASA: III  Anesthesia Plan: MAC and Spinal   Post-op Pain Management:    Induction:   PONV Risk Score and Plan: 2 and Ondansetron, Dexamethasone and Treatment may vary due to age  Airway Management Planned: Natural Airway and Simple Face Mask  Additional Equipment:   Intra-op Plan:   Post-operative Plan:   Informed Consent: I have reviewed the patients History and Physical, chart, labs and discussed the procedure including the risks, benefits and alternatives for the proposed anesthesia with the patient or authorized representative who has indicated his/her understanding and acceptance.   Dental advisory given and Consent reviewed with POA  Plan Discussed with: CRNA, Anesthesiologist and Surgeon  Anesthesia Plan Comments:        Anesthesia Quick Evaluation

## 2017-01-19 NOTE — Progress Notes (Signed)
[facility]                                                                              Patient Demographics  Kristin [name], is a 83 y.o. female, DOB - 04/13/1931, GTX:[id number]  Admit date - 01/18/2017   Admitting Physician [name], MD  Outpatient Primary MD for the patient is [name], MD  Outpatient specialists:   LOS - 1  days   Medical records reviewed and are as summarized below:    Chief Complaint  Patient presents with  . Vaginal Bleeding       Brief summary   Patient is a 83 year old female with hypertension, CKD stage III, depression, multiple myeloma receiving palliative treatments presented to the ED after severe right hip pain. Patient reported being bedbound for past 2 weeks due to severe pain in the right hip and prior to this she was able to walk with a walker. Patient's daughter also noted some pink spots that appear to be blood on her underwear last night. CT of the hip showed pathology to fracture in the right femoral neck with large lytic lesion at the superior aspect of the lateral femoral head and diffuse myeloma. UA was positive for UTI and grossly bloody.   Assessment & Plan    Principal Problem:  Pathologic right hip fracture Secondary to multiple myeloma -Patient presented with severe right hip pain, found to have pathology couple right hip fracture with a large lytic lesion on the CT scan.  - Orthopedics consulted, discussed with Dr. Percell Miller this morning, NPO, plan for surgery today - Pain control, DVT prophylaxis per orthopedics     Multiple myeloma  -  follows oncology, Dr. Burr Medico palliative treatments  - She was treated with lenalidomide, currently on hold.     Hyperkalemia  - Serum potassium 5.4 on admission without EKG changes  - Likely secondary to renal insufficiency, Repeat potassium stable   Macrocytic anemia -  hemoglobin 8.1, follow H&H closely. Anemia likely due to multiple myeloma    UTI  - UA  is consistent with UTI  -Follow urine culture and sensitivities, continue IV Rocephin   Hypertension  - BP is soft but stable since admission  - Hold Norvasc given soft pressures and resume as appropriate   CKD stage III  - SCr is 1.11 on admission, improved today  - Continue IVF hydration  Code Status: Full CODE STATUS DVT Prophylaxis:   SCD's Family Communication: Discussed in detail with the patient, all imaging results, lab results explained to the patient   Disposition Plan:   Time Spent in minutes  25 minutes  Procedures:    Consultants:   Orthopedics Palliative medicine  Antimicrobials :   IV Rocephin 6/6    Medications  Scheduled Meds: . cycloSPORINE  1 drop Both Eyes Daily  . feeding supplement (ENSURE ENLIVE)  237 mL Oral BID BM  . insulin aspart  0-9 Units Subcutaneous Q4H  . mirtazapine  7.5 mg Oral QHS  . morphine  30 mg Oral Q12H  . risperiDONE  0.5 mg Oral BID  . senna-docusate  2 tablet Oral Daily  Continuous Infusions: . cefTRIAXone (ROCEPHIN)  IV Stopped (01/19/17 0134)   PRN Meds:.bisacodyl, HYDROcodone-acetaminophen, meclizine, morphine injection, oxyCODONE, sodium phosphate   Antibiotics   Anti-infectives    Start     Dose/Rate Route Frequency Ordered Stop   01/18/17 2200  cefTRIAXone (ROCEPHIN) 1 g in dextrose 5 % 50 mL IVPB     1 g 100 mL/hr over 30 Minutes Intravenous Every 24 hours 01/18/17 2049     01/18/17 1730  cephALEXin (KEFLEX) capsule 500 mg     500 mg Oral  Once 01/18/17 1717 01/18/17 1751        Subjective:   Marquasia Schmieder was seen and examined today. Patient denies dizziness, chest pain, shortness of breath, abdominal pain, N/V/D/C,  No acute events overnight. Right hip pain 7/10, sharp and constant, worse on movement    Objective:   Vitals:   01/19/17 0055 01/19/17 0152 01/19/17 0325 01/19/17 1313  BP: (!) 117/49 (!) 115/48 (!) 120/59 (!) 119/51  Pulse: 72 82 87 89  Resp: 20 (!) 22 20 17   Temp: 98.5 F  (36.9 C) 97.9 F (36.6 C) 97.8 F (36.6 C) 97.8 F (36.6 C)  TempSrc: Oral Oral Oral Oral  SpO2: 100%  100% 100%  Weight:      Height:       No intake or output data in the 24 hours ending 01/19/17 1332   Wt Readings from Last 3 Encounters:  01/18/17 63.9 kg (140 lb 14 oz)  01/02/17 64 kg (141 lb)  12/05/16 63.2 kg (139 lb 6.4 oz)     Exam  General: Alert and oriented x 3, NAD, Chronically ill and frail   Eyes: PERRLA, EOMI, Anicteric Sclera,  HEENT:  Atraumatic, normocephalic, normal oropharynx  Cardiovascular: S1 S2 auscultated, no rubs, murmurs or gallops. Regular rate and rhythm.  Respiratory: Clear to auscultation bilaterally, no wheezing, rales or rhonchi  Gastrointestinal: Soft, nontender, nondistended, + bowel sounds  Ext: no pedal edema bilaterally  Neuro: No gross focal neurological deficits   Musculoskeletal: No digital cyanosis, clubbing, right leg is shortened, externally rotated   Skin: No rashes  Psych: Normal affect and demeanor, alert and oriented x3    Data Reviewed:  I have personally reviewed following labs and imaging studies  Micro Results Recent Results (from the past 240 hour(s))  Surgical pcr screen     Status: None   Collection Time: 01/19/17 11:54 AM  Result Value Ref Range Status   MRSA, PCR NEGATIVE NEGATIVE Final   Staphylococcus aureus NEGATIVE NEGATIVE Final    Comment:        The Xpert SA Assay (FDA approved for NASAL specimens in patients over 69 years of age), is one component of a comprehensive surveillance program.  Test performance has been validated by [facility] for patients greater than or equal to 49 year old. It is not intended to diagnose infection nor to guide or monitor treatment.     Radiology Reports Ct Hip Right Wo Contrast  Result Date: 01/18/2017 CLINICAL DATA:  Chronic right hip pain in patient with history of multiple myeloma. No known injury. Initial encounter. EXAM: CT OF THE RIGHT HIP  WITHOUT CONTRAST TECHNIQUE: Multidetector CT imaging of the right hip was performed according to the standard protocol. Multiplanar CT image reconstructions were also generated. COMPARISON:  Plain films right hip 01/02/2017. FINDINGS: Bones/Joint/Cartilage The patient has a fracture through the right femoral neck. The fracture is mainly basicervical. There is a large lytic lesion in the superior  aspect of the lateral femoral head. Extensive bony loss is present at the site of the fracture consistent with a pathologic injury in this patient with a history of multiple myeloma. There may be an incomplete and nondisplaced fracture of the right inferior pubic ramus. No other fracture is identified. All imaged bones demonstrate findings consistent with diffuse myelomatous involvement with loss of medullary bone and extensive endosteal scalloping. Ligaments Suboptimally assessed by CT. Muscles and Tendons Intact. Soft tissues Some soft tissue edema about the right hip. Atherosclerotic calcifications are noted. IMPRESSION: Findings consistent with a pathologic fracture of the right femoral neck. A large lytic lesion in the superior aspect of the lateral femoral head is identified. Possible nondisplaced fracture of the right inferior pubic ramus. Diffuse myeloma. Electronically Signed   By: Inge Rise M.D.   On: 01/18/2017 15:59   Dg Chest Port 1 View  Result Date: 01/18/2017 CLINICAL DATA:  patient from home, hx multiple myeloma. Daughter noticed light pink spotting in brief beginning last night. Acute/chronic right hip pain. Denies abdominal pain, N/V/D, chest pain and SOB. Hx hypertension - hx chronic kidney disease EXAM: PORTABLE CHEST - 1 VIEW COMPARISON:  10/22/2016 FINDINGS: Low lung volumes.  No focal infiltrate or overt edema. Heart size upper limits normal.  Tortuous atheromatous aorta. No effusion. Degenerative spurring in bilateral shoulders. Changes of previous mid thoracic vertebral augmentation.  IMPRESSION: Low volumes.  No acute disease. Electronically Signed   By: Lucrezia Europe M.D.   On: 01/18/2017 20:19   Dg Hip Unilat W Or W/o Pelvis 2-3 Views Right  Result Date: 01/02/2017 CLINICAL DATA:  Right hip pain.  No injury.  Multiple myeloma . EXAM: DG HIP (WITH OR WITHOUT PELVIS) 2-3V RIGHT COMPARISON:  06/14/2016. FINDINGS: Degenerative changes lumbar spine and both hips. No evidence of fracture or dislocation. Multiple tiny lucencies are noted in the proximal femurs bilaterally. These changes may be related to multiple mild lumbar given the patient's history. Peripheral vascular calcification. Surgical clips right upper quadrant . IMPRESSION: 1. Degenerative changes lumbar spine and both hips. No evidence of fracture or dislocation. 2. Diffuse osteopenia. Multiple tiny lucencies are noted throughout both proximal femurs. These may be secondary to the patient's known multiple myeloma. Electronically Signed   By: Marcello Moores  Register   On: 01/02/2017 13:52    Lab Data:  CBC:  Recent Labs Lab 01/18/17 1803 01/19/17 0644  WBC 6.9 4.8  NEUTROABS 3.4  --   HGB 8.6* 8.1*  HCT 26.0* 25.0*  MCV 102.0* 103.3*  PLT 165 409   Basic Metabolic Panel:  Recent Labs Lab 01/18/17 1803 01/19/17 0644 01/19/17 1159  NA 138 135  --   K 5.4* 4.4 4.5  CL 105 104  --   CO2 24 23  --   GLUCOSE 153* 106*  --   BUN 37* 28*  --   CREATININE 1.11* 0.91  --   CALCIUM 7.2* 6.6*  --    GFR: Estimated Creatinine Clearance: 35.4 mL/min (by C-G formula based on SCr of 0.91 mg/dL). Liver Function Tests: No results for input(s): AST, ALT, ALKPHOS, BILITOT, PROT, ALBUMIN in the last 168 hours. No results for input(s): LIPASE, AMYLASE in the last 168 hours. No results for input(s): AMMONIA in the last 168 hours. Coagulation Profile: No results for input(s): INR, PROTIME in the last 168 hours. Cardiac Enzymes: No results for input(s): CKTOTAL, CKMB, CKMBINDEX, TROPONINI in the last 168 hours. BNP (last 3  results) No results for input(s): PROBNP in  the last 8760 hours. HbA1C: No results for input(s): HGBA1C in the last 72 hours. CBG:  Recent Labs Lab 01/19/17 0120 01/19/17 0418 01/19/17 0801 01/19/17 1152  GLUCAP 134* 112* 106* 116*   Lipid Profile: No results for input(s): CHOL, HDL, LDLCALC, TRIG, CHOLHDL, LDLDIRECT in the last 72 hours. Thyroid Function Tests: No results for input(s): TSH, T4TOTAL, FREET4, T3FREE, THYROIDAB in the last 72 hours. Anemia Panel: No results for input(s): VITAMINB12, FOLATE, FERRITIN, TIBC, IRON, RETICCTPCT in the last 72 hours. Urine analysis:    Component Value Date/Time   COLORURINE YELLOW 01/18/2017 1524   APPEARANCEUR CLOUDY (A) 01/18/2017 1524   LABSPEC 1.012 01/18/2017 1524   PHURINE 5.0 01/18/2017 1524   GLUCOSEU NEGATIVE 01/18/2017 1524   HGBUR LARGE (A) 01/18/2017 1524   BILIRUBINUR NEGATIVE 01/18/2017 1524   KETONESUR NEGATIVE 01/18/2017 1524   PROTEINUR 30 (A) 01/18/2017 1524   UROBILINOGEN 0.2 10/18/2007 0945   NITRITE NEGATIVE 01/18/2017 1524   LEUKOCYTESUR LARGE (A) 01/18/2017 1524     Ripudeep Rai M.D. [facility] 01/19/2017, 1:32 PM  Pager: [phone] Between 7am to 7pm - call Pager - [phone]  After 7pm go to [email or web address] - password [email or web address]  Call night coverage person covering after 7pm

## 2017-01-19 NOTE — Progress Notes (Signed)
Spoke to patient who is alert and oriented X 4 and her daughter, who is at bedside regarding her code status.  Patient clearly states she wants to be a FULL CODE. Will put sticky note on chart, stating such.

## 2017-01-19 NOTE — Anesthesia Postprocedure Evaluation (Signed)
Anesthesia Post Note  Patient: Kristin [name]  Procedure(s) Performed: Procedure(s) (LRB): RIGHT HIP HEMIARTHROPLASTY (Right)     Patient location during evaluation: PACU Anesthesia Type: MAC and Spinal Level of consciousness: awake and alert Pain management: pain level controlled Vital Signs Assessment: post-procedure vital signs reviewed and stable Respiratory status: spontaneous breathing and respiratory function stable Cardiovascular status: blood pressure returned to baseline and stable Postop Assessment: spinal receding Anesthetic complications: no    Last Vitals:  Vitals:   01/19/17 1650 01/19/17 1705  BP: (!) 129/59 (!) 113/51  Pulse: 87 81  Resp: 20 18  Temp: 36.7 C     Last Pain:  Vitals:   01/19/17 1650  TempSrc:   PainSc: 0-No pain                 Marica Trentham DANIEL

## 2017-01-19 NOTE — Transfer of Care (Signed)
Immediate Anesthesia Transfer of Care Note  Patient: Kristin [name]  Procedure(s) Performed: Procedure(s): RIGHT HIP HEMIARTHROPLASTY (Right)  Patient Location: PACU  Anesthesia Type:MAC and Spinal  Level of Consciousness: awake, alert , oriented and patient cooperative  Airway & Oxygen Therapy: Patient Spontanous Breathing and Patient connected to nasal cannula oxygen  Post-op Assessment: Report given to RN, Post -op Vital signs reviewed and stable and Patient moving all extremities  Post vital signs: Reviewed and stable  Last Vitals:  Vitals:   01/19/17 0325 01/19/17 1313  BP: (!) 120/59 (!) 119/51  Pulse: 87 89  Resp: 20 17  Temp: 36.6 C 36.6 C    Last Pain:  Vitals:   01/19/17 1313  TempSrc: Oral  PainSc:       Patients Stated Pain Goal: 0 (70/96/28 3662)  Complications: No apparent anesthesia complications

## 2017-01-19 NOTE — Progress Notes (Signed)
Requested pharmacy send Rocephin.

## 2017-01-19 NOTE — Consult Note (Signed)
ORTHOPAEDIC CONSULTATION  REQUESTING PHYSICIAN: Rai, Vernelle Emerald, MD  Chief Complaint: Right Hip pain  Assessment / Plan: Principal Problem:   Hip fracture, pathological (HCC) Active Problems:   HTN (hypertension)   Anemia in neoplastic disease   Multiple myeloma (HCC)   CKD (chronic kidney disease), stage III   Acute lower UTI   Pathologic hip fracture (HCC)   Hyperkalemia  Possible Operative fixation as soon as this afternoon. Discussion with daughter and patient about possible surgery pending  -NPO  -Medicine team to admit and pre-op clearance -Type and Cross for 2 units held for OR -PT/OT post op -Will ammend WB status postop, bedrest for now  HPI: Kristin [name] is a 83 y.o. female who complains of right hip pain worsening over the past 2 weeks becoming severe and limiting ability to bear weight. Presented to the ED where x-rays showed pathologic fracture of the right femoral neck. A large lytic lesion in the superior aspect of the lateral femoral head is identified.  Orthopedics was consulted for evaluation.  Her treatment is complicated considering multiple myeloma currently receiving palliative treatments.  The risks benefits and alternatives of the procedure were discussed with the patient.  The main benefits and goals of surgery would be to relieve pain and allow for the possibility of bearing weight.  The patient says that she understands the risks of the surgery and would like to have the surgery, but will have additional discussions with her daughter this morning to arrive a final decision.  Past Medical History:  Diagnosis Date  . Allergy   . Arthritis   . Cancer ([place])   . Chronic kidney disease (CKD), stage III (moderate)    Archie Endo 09/23/2016  . Depression    Risperdal "twice/day" (09/23/2016)  . Glaucoma   . Hiatal hernia   . Hypertension   . Intractable back pain    /notes 09/23/2016  . Obesity   . Osteoporosis    vertebral fracture in 2012  . Upper  GI bleed 04/2004   secondary to Mallory-Weiss tear/notes 12/28/2010  . Vertigo    Past Surgical History:  Procedure Laterality Date  . ABDOMINAL HYSTERECTOMY  1974   partial  . APPENDECTOMY     as a child/notes 12/28/2010  . BACK SURGERY    . BREAST BIOPSY Left 1999   Archie Endo 12/28/2010  . CATARACT EXTRACTION, BILATERAL Bilateral 2000s  . ESOPHAGOGASTRODUODENOSCOPY  04/2004   Archie Endo 12/28/2010  . KYPHOPLASTY  05/2011   T10/notes 05/25/2011  . LAPAROSCOPIC CHOLECYSTECTOMY  10/2007   Archie Endo 12/14/2010   Social History   Social History  . Marital status: Widowed    Spouse name: N/A  . Number of children: N/A  . Years of education: N/A   Social History Main Topics  . Smoking status: Never Smoker  . Smokeless tobacco: Never Used  . Alcohol use Yes     Comment: 09/23/2016 "glass of wine a few times/year"  . Drug use: No  . Sexual activity: No   Other Topics Concern  . None   Social History Narrative  . None   Family History  Problem Relation Age of Onset  . Hypertension Mother   . Osteoporosis Mother   . CVA Mother   . Cancer Neg Hx    Allergies  Allergen Reactions  . Alendronate Nausea Only and Other (See Comments)    Hip pain  . Penicillins Rash    Has patient had a PCN reaction causing immediate rash,  facial/tongue/throat swelling, SOB or lightheadedness with hypotension: no Has patient had a PCN reaction causing severe rash involving mucus membranes or skin necrosis: no Has patient had a PCN reaction that required hospitalization no Has patient had a PCN reaction occurring within the last 10 years:no If all of the above answers are "NO", then may proceed with Cephalosporin use.    . Sulfonamide Derivatives Itching and Rash   Prior to Admission medications   Medication Sig Start Date End Date Taking? Authorizing Provider  amLODipine (NORVASC) 5 MG tablet Take 1 tablet (5 mg total) by mouth daily. 02/19/14  Yes Cherre Robins, PharmD  bisacodyl (DULCOLAX) 10 MG  suppository Place 1 suppository (10 mg total) rectally daily as needed for moderate constipation. 06/21/16  Yes Debbe Odea, MD  dexamethasone (DECADRON) 4 MG tablet Take 5 tablets (20 mg total) by mouth once a week. 12/05/16  Yes Truitt Merle, MD  feeding supplement, ENSURE ENLIVE, (ENSURE ENLIVE) LIQD Take 237 mLs by mouth 2 (two) times daily between meals. 10/25/16  Yes Florencia Reasons, MD  magnesium oxide (MAG-OX) 400 (241.3 Mg) MG tablet Take 1 tablet (400 mg total) by mouth daily. 10/26/16  Yes Sheikh, Omair Latif, DO  meclizine (ANTIVERT) 25 MG tablet TAKE ONE TABLET BY MOUTH THREE TIMES DAILY AS NEEDED Patient taking differently: Take 25 mg by mouth 3 (three) times daily as needed for dizziness. TAKE ONE TABLET BY MOUTH THREE TIMES DAILY AS NEEDED 06/20/14  Yes Chipper Herb, MD  mirtazapine (REMERON) 7.5 MG tablet TAKE 1 TABLET (7.5 MG TOTAL) BY MOUTH AT BEDTIME. 01/16/17  Yes Truitt Merle, MD  morphine ([name]) 30 MG 12 hr tablet Take 30 mg by mouth every 12 (twelve) hours. 01/13/17  Yes [provider]  ondansetron (ZOFRAN ODT) 4 MG disintegrating tablet Take 1-2 tablets (4-8 mg total) by mouth every 8 (eight) hours as needed for nausea or vomiting. 11/17/16  Yes Truitt Merle, MD  Oxycodone HCl 10 MG TABS Take 1 tablet (10 mg total) by mouth every 4 (four) hours. 01/02/17  Yes Truitt Merle, MD  RESTASIS 0.05 % ophthalmic emulsion Place 1 drop into both eyes daily.  01/31/13  Yes [provider]  risperiDONE (RISPERDAL) 0.5 MG tablet Take 0.5 mg by mouth 2 (two) times daily.   Yes [provider]  sennosides-docusate sodium (SENOKOT-S) 8.6-50 MG tablet Take 2 tablets by mouth daily.   Yes [provider]  lenalidomide (REVLIMID) 15 MG capsule Take 1 capsule (15 mg total) by mouth daily. Take 15 mg daily for  21  Days  ,  Off  7  Days. 12/29/16   Truitt Merle, MD   Ct Hip Right Wo Contrast  Result Date: 01/18/2017 CLINICAL DATA:  Chronic right hip pain in patient with history of  multiple myeloma. No known injury. Initial encounter. EXAM: CT OF THE RIGHT HIP WITHOUT CONTRAST TECHNIQUE: Multidetector CT imaging of the right hip was performed according to the standard protocol. Multiplanar CT image reconstructions were also generated. COMPARISON:  Plain films right hip 01/02/2017. FINDINGS: Bones/Joint/Cartilage The patient has a fracture through the right femoral neck. The fracture is mainly basicervical. There is a large lytic lesion in the superior aspect of the lateral femoral head. Extensive bony loss is present at the site of the fracture consistent with a pathologic injury in this patient with a history of multiple myeloma. There may be an incomplete and nondisplaced fracture of the right inferior pubic ramus. No other fracture is identified. All  imaged bones demonstrate findings consistent with diffuse myelomatous involvement with loss of medullary bone and extensive endosteal scalloping. Ligaments Suboptimally assessed by CT. Muscles and Tendons Intact. Soft tissues Some soft tissue edema about the right hip. Atherosclerotic calcifications are noted. IMPRESSION: Findings consistent with a pathologic fracture of the right femoral neck. A large lytic lesion in the superior aspect of the lateral femoral head is identified. Possible nondisplaced fracture of the right inferior pubic ramus. Diffuse myeloma. Electronically Signed   By: Inge Rise M.D.   On: 01/18/2017 15:59   Dg Chest Port 1 View  Result Date: 01/18/2017 CLINICAL DATA:  patient from home, hx multiple myeloma. Daughter noticed light pink spotting in brief beginning last night. Acute/chronic right hip pain. Denies abdominal pain, N/V/D, chest pain and SOB. Hx hypertension - hx chronic kidney disease EXAM: PORTABLE CHEST - 1 VIEW COMPARISON:  10/22/2016 FINDINGS: Low lung volumes.  No focal infiltrate or overt edema. Heart size upper limits normal.  Tortuous atheromatous aorta. No effusion. Degenerative spurring in  bilateral shoulders. Changes of previous mid thoracic vertebral augmentation. IMPRESSION: Low volumes.  No acute disease. Electronically Signed   By: Lucrezia Europe M.D.   On: 01/18/2017 20:19    Positive ROS: All other systems have been reviewed and were otherwise negative with the exception of those mentioned in the HPI and as above.  Objective: Labs cbc  Recent Labs  01/18/17 1803 01/19/17 0644  WBC 6.9 4.8  HGB 8.6* 8.1*  HCT 26.0* 25.0*  PLT 165 168    Labs inflam No results for input(s): CRP in the last 72 hours.  Invalid input(s): ESR  Labs coag No results for input(s): INR, PTT in the last 72 hours.  Invalid input(s): PT   Recent Labs  01/18/17 1803 01/19/17 0644  NA 138 135  K 5.4* 4.4  CL 105 104  CO2 24 23  GLUCOSE 153* 106*  BUN 37* 28*  CREATININE 1.11* 0.91  CALCIUM 7.2* 6.6*    Physical Exam: Vitals:   01/19/17 0152 01/19/17 0325  BP: (!) 115/48 (!) 120/59  Pulse: 82 87  Resp: (!) 22 20  Temp: 97.9 F (36.6 C) 97.8 F (36.6 C)   General: Alert, no acute distress Mental status: Alert and Oriented x3 Neurologic: Speech Clear and organized, no gross focal findings or movement disorder appreciated. Respiratory: No cyanosis, no use of accessory musculature Cardiovascular: No pedal edema GI: Abdomen is soft and non-tender, non-distended. Skin: Warm and dry.  No lesions in the area of chief complaint  Extremities: Warm w/o pitting edema Psychiatric: Patient is competent for consent with normal mood and affect  MUSCULOSKELETAL:  Right leg pain with range of motion at the hip. Externally rotated. Feet warm. Neurovascularly intact distally. EHL, FHL, dorsiflexion, plantar flexion intact. Other extremities are atraumatic with painless ROM and NVI.   Prudencio Burly III PA-C 01/19/2017 8:33 AM

## 2017-01-19 NOTE — Consult Note (Signed)
Consultation Note Date: 01/19/2017   Patient Name: Kristin [name]  DOB: Jan 03, 1931  MRN: [id number]  Age / Sex: 83 y.o., female  PCP: [name], MD Referring Physician: Mendel Corning, MD  Reason for Consultation: Establishing goals of care and Psychosocial/spiritual support  HPI/Patient Profile: 83 y.o. female   admitted on 01/18/2017 with past  medical history significant for hypertension, chronic kidney disease stage III, depression, and multiple myeloma receiving palliative treatments/ Dr Burr Medico, admitted  to the emergency department for evaluation of severe right hip pain.  Patient is recently admitted to hospice services thru [facility] on week ago.  Per family she hasbeen bedbound for the past 2 weeks due to severe pain in her right hip that is worse with any movement. Prior to this, she was able to stand with a walker and take a few steps at a time on her good days. She developed right hip pain 2 weeks ago, described as severe, aching, localized to the right hip, worse with any movement, and better with rest.   The right hip pain has continually progressed over the last 2 weeks.  In ED  CT of the hip demonstrated pathologic fracture in the right femoral neck with a large lytic lesion at the superior aspect of the lateral femoral head and diffuse myeloma.   Patient and family are faced with advanced directive decisions and anticipatory care needs.  Clinical Assessment and Goals of Care:  This NP Wadie Lessen reviewed medical records, received report from team, assessed the patient and then meet at the patient's bedside along with her daughter/ Virgel Bouquet  to discuss diagnosis, prognosis, GOC, EOL wishes disposition and options  We discussed patient's multiple co-morbidities as it relates to long term poor prognosis and recovery from surgery.  She was receiving hospice benefit prior to this  hospitalization.  At this time both the patient and her family understand the risk and the benefits of surgery,  and the hope is for better pain control, increased mobility and increased quality of life,  and all remain hopeful for improvement and eventual retrun to home.  We discussed that likely patient will require SNF for rrhab and family would like to speak with SW regarding possible facilities , will notify SW  A  discussion was had today regarding advanced directives.  Concepts specific to code status, artifical feeding and hydration, continued IV antibiotics and rehospitalization was had.  The difference between a aggressive medical intervention path  and a palliative comfort care path for this patient at this time was had.    Values and goals of care important to patient and family were attempted to be elicited.   Concept of Hospice and Palliative Care were discussed  Natural trajectory and expectations at EOL were discussed.  Questions and concerns addressed.   Family encouraged to call with questions or concerns.  PMT will continue to support holistically.   SUMMARY OF RECOMMENDATIONS    Code Status/Advance Care Planning:  Full code- there was some misunderstanding regarding code  status.  It is clarified this morning as;    Patient will remain full code until stabilized after surgery and then the desire is for a DNR/DNI as previously documented    Symptom Management:   Pain: will need continued assessment and adjustment post -op to manage pain and maximize rehab potential and quality of life.  Palliative Prophylaxis:   Aspiration, Bowel Regimen, Delirium Protocol, Frequent Pain Assessment, Oral Care and Turn Reposition  Additional Recommendations (Limitations, Scope, Preferences):  Full Scope Treatment  Psycho-social/Spiritual:    Additional Recommendations: Education on Hospice   Emotional supported offered to daughter this is a difficult time for her and their  family and she wants to "do the right thing"  Prognosis:   Unable to determine  Discharge Planning: To Be Determined      Primary Diagnoses: Present on Admission: . HTN (hypertension) . Multiple myeloma ([place]) . Anemia in neoplastic disease . CKD (chronic kidney disease), stage III . Hip fracture, pathological ([place]) . Acute lower UTI . Pathologic hip fracture ([place]) . Hyperkalemia   I have reviewed the medical record, interviewed the patient and family, and examined the patient. The following aspects are pertinent.  Past Medical History:  Diagnosis Date  . Allergy   . Arthritis   . Cancer ([place])   . Chronic kidney disease (CKD), stage III (moderate)    Archie Endo 09/23/2016  . Depression    Risperdal "twice/day" (09/23/2016)  . Glaucoma   . Hiatal hernia   . Hypertension   . Intractable back pain    /notes 09/23/2016  . Obesity   . Osteoporosis    vertebral fracture in 2012  . Upper GI bleed 04/2004   secondary to Mallory-Weiss tear/notes 12/28/2010  . Vertigo    Social History   Social History  . Marital status: Widowed    Spouse name: N/A  . Number of children: N/A  . Years of education: N/A   Social History Main Topics  . Smoking status: Never Smoker  . Smokeless tobacco: Never Used  . Alcohol use Yes     Comment: 09/23/2016 "glass of wine a few times/year"  . Drug use: No  . Sexual activity: No   Other Topics Concern  . None   Social History Narrative  . None   Family History  Problem Relation Age of Onset  . Hypertension Mother   . Osteoporosis Mother   . CVA Mother   . Cancer Neg Hx    Scheduled Meds: . cycloSPORINE  1 drop Both Eyes Daily  . feeding supplement (ENSURE ENLIVE)  237 mL Oral BID BM  . insulin aspart  0-9 Units Subcutaneous Q4H  . mirtazapine  7.5 mg Oral QHS  . morphine  30 mg Oral Q12H  . risperiDONE  0.5 mg Oral BID  . senna-docusate  2 tablet Oral Daily   Continuous Infusions: . cefTRIAXone (ROCEPHIN)  IV Stopped (01/19/17  0134)   PRN Meds:.bisacodyl, HYDROcodone-acetaminophen, meclizine, morphine injection, oxyCODONE, sodium phosphate Medications Prior to Admission:  Prior to Admission medications   Medication Sig Start Date End Date Taking? Authorizing Provider  amLODipine (NORVASC) 5 MG tablet Take 1 tablet (5 mg total) by mouth daily. 02/19/14  Yes Cherre Robins, PharmD  bisacodyl (DULCOLAX) 10 MG suppository Place 1 suppository (10 mg total) rectally daily as needed for moderate constipation. 06/21/16  Yes Debbe Odea, MD  dexamethasone (DECADRON) 4 MG tablet Take 5 tablets (20 mg total) by mouth once a week. 12/05/16  Yes Truitt Merle, MD  feeding supplement, ENSURE ENLIVE, (ENSURE ENLIVE) LIQD Take 237 mLs by mouth 2 (two) times daily between meals. 10/25/16  Yes Florencia Reasons, MD  magnesium oxide (MAG-OX) 400 (241.3 Mg) MG tablet Take 1 tablet (400 mg total) by mouth daily. 10/26/16  Yes Sheikh, Omair Latif, DO  meclizine (ANTIVERT) 25 MG tablet TAKE ONE TABLET BY MOUTH THREE TIMES DAILY AS NEEDED Patient taking differently: Take 25 mg by mouth 3 (three) times daily as needed for dizziness. TAKE ONE TABLET BY MOUTH THREE TIMES DAILY AS NEEDED 06/20/14  Yes Chipper Herb, MD  mirtazapine (REMERON) 7.5 MG tablet TAKE 1 TABLET (7.5 MG TOTAL) BY MOUTH AT BEDTIME. 01/16/17  Yes Truitt Merle, MD  morphine ([name]) 30 MG 12 hr tablet Take 30 mg by mouth every 12 (twelve) hours. 01/13/17  Yes [provider]  ondansetron (ZOFRAN ODT) 4 MG disintegrating tablet Take 1-2 tablets (4-8 mg total) by mouth every 8 (eight) hours as needed for nausea or vomiting. 11/17/16  Yes Truitt Merle, MD  Oxycodone HCl 10 MG TABS Take 1 tablet (10 mg total) by mouth every 4 (four) hours. 01/02/17  Yes Truitt Merle, MD  RESTASIS 0.05 % ophthalmic emulsion Place 1 drop into both eyes daily.  01/31/13  Yes [provider]  risperiDONE (RISPERDAL) 0.5 MG tablet Take 0.5 mg by mouth 2 (two) times daily.   Yes [provider]    sennosides-docusate sodium (SENOKOT-S) 8.6-50 MG tablet Take 2 tablets by mouth daily.   Yes [provider]  lenalidomide (REVLIMID) 15 MG capsule Take 1 capsule (15 mg total) by mouth daily. Take 15 mg daily for  21  Days  ,  Off  7  Days. 12/29/16   Truitt Merle, MD   Allergies  Allergen Reactions  . Alendronate Nausea Only and Other (See Comments)    Hip pain  . Penicillins Rash    Has patient had a PCN reaction causing immediate rash, facial/tongue/throat swelling, SOB or lightheadedness with hypotension: no Has patient had a PCN reaction causing severe rash involving mucus membranes or skin necrosis: no Has patient had a PCN reaction that required hospitalization no Has patient had a PCN reaction occurring within the last 10 years:no If all of the above answers are "NO", then may proceed with Cephalosporin use.    . Sulfonamide Derivatives Itching and Rash   Review of Systems  Neurological: Positive for weakness.    Physical Exam  Constitutional: She is oriented to person, place, and time.  -frail, elderly  HENT:  Mouth/Throat: Oropharynx is clear and moist.  Cardiovascular: Normal rate, regular rhythm and normal heart sounds.   Pulmonary/Chest: She has decreased breath sounds in the right lower field and the left lower field.  Abdominal: Soft. Bowel sounds are normal.  Neurological: She is alert and oriented to person, place, and time.  Skin: Skin is warm and dry.    Vital Signs: BP (!) 120/59 (BP Location: Right Arm) Comment: nurse was notified  Pulse 87   Temp 97.8 F (36.6 C) (Oral)   Resp 20   Ht 4' 9"  (1.448 m)   Wt 63.9 kg (140 lb 14 oz)   SpO2 100%   BMI 30.48 kg/m  Pain Assessment: 0-10   Pain Score: 7    SpO2: SpO2: 100 % O2 Device:SpO2: 100 % O2 Flow Rate: .O2 Flow Rate (L/min): 2 L/min  IO: Intake/output summary: No intake or output data in the 24 hours ending 01/19/17 0840  LBM: Last  BM Date: 01/16/17 Baseline Weight: Weight: 63.9 kg  (140 lb 14 oz) Most recent weight: Weight: 63.9 kg (140 lb 14 oz)      Palliative Assessment/Data: 30%   Discussed with Dr Tana Coast and Ortho team with Dr Percell Miller  Time In: 0845 Time Out: 1000 Time Total: 75 minutes Greater than 50%  of this time was spent counseling and coordinating care related to the above assessment and plan.  Signed by: Wadie Lessen, NP   Please contact Palliative Medicine Team phone at [phone] for questions and concerns.  For individual provider: See Shea Evans

## 2017-01-19 NOTE — Op Note (Signed)
01/18/2017 - 01/19/2017  4:06 PM  PATIENT:  Kristin [name]   MRN: [id number]  PRE-OPERATIVE DIAGNOSIS:  right hip fracture  POST-OPERATIVE DIAGNOSIS:  right hip fracture  PROCEDURE:  Procedure(s): RIGHT HIP HEMIARTHROPLASTY  PREOPERATIVE INDICATIONS:  Kristin [name] is an 83 y.o. female who was admitted 01/18/2017 with a diagnosis of Hip fracture, pathological ([place]) and elected for surgical management.  The risks benefits and alternatives were discussed with the patient including but not limited to the risks of nonoperative treatment, versus surgical intervention including infection, bleeding, nerve injury, periprosthetic fracture, the need for revision surgery, dislocation, leg length discrepancy, blood clots, cardiopulmonary complications, morbidity, mortality, among others, and they were willing to proceed.  Predicted outcome is good, although there will be at least a six to nine month expected recovery.   OPERATIVE REPORT     SURGEON:  Edmonia Lynch, MD    ASSISTANT:  Roxan Hockey, PA-C, he was present and scrubbed throughout the case, critical for completion in a timely fashion, and for retraction, instrumentation, and closure.     ANESTHESIA:  General    COMPLICATIONS:  None.      COMPONENTS:  [employer] Acol[employer]: Femoral stem: 2.5, Femoral Head:42, Neck:0   PROCEDURE IN DETAIL: The patient was met in the holding area and identified.  The appropriate hip  was marked at the operative site. The patient was then transported to the OR and  placed under general anesthesia.  At that point, the patient was  placed in the lateral decubitus position with the operative side up and  secured to the operating room table and all bony prominences padded.     The operative lower extremity was prepped from the iliac crest to the toes.  Sterile draping was performed.  Time out was performed prior to incision.      A routine posterolateral approach was utilized via sharp dissection  carried down  to the subcutaneous tissue.  Gross bleeders were Bovie  coagulated.  The iliotibial band was identified and incised  along the length of the skin incision.  Self-retaining retractors were  inserted. I examined the bursa there was significant hematoma and edema I performed a bursectomy here.  With the hip internally rotated, the short external rotators  were identified. The piriformis was tagged with FiberWire, and the hip capsule released in a T-type fashion.  The femoral neck was exposed, and I resected the femoral neck using the appropriate jig. This was performed at approximately a thumb's breadth above the lesser trochanter.    I then exposed the deep acetabulum, cleared out any tissue including the ligamentum teres, and included the hip capsule in the FiberWire used above and below the T.    I then prepared the proximal femur using the cookie-cutter, the lateralizing reamer, and then sequentially broached.  A trial utilized, and I reduced the hip and it was found to have excellent stability with functional range of motion. The trial components were then removed.   The canal and acetabulum were thoroughly irrigated  I inserted the pressfit stem and placed the head and neck collar. The hip was reduced with appropriate force and was stable through a range of motion.   I then used a 2 mm drill bits to pass the FiberWire suture from the capsule and puriform is through the greater trochanter, and secured this. Excellent posterior capsular repair was achieved. I also closed the T in the capsule.  I then irrigated the hip copiously again with pulse lavage,  and repaired the fascia with Vicryl, followed by Vicryl for the subcutaneous tissue, Monocryl for the skin, Steri-Strips and sterile gauze. The wounds were injected. The patient was then awakened and returned to PACU in stable and satisfactory condition. There were no complications.  POST-OP PLAN: Weight bearing as tolerated. DVT px will consist of  SCD's and chemical px  Edmonia Lynch, MD Orthopedic Surgeon [phone]   01/19/2017 4:06 PM

## 2017-01-19 NOTE — Care Management Note (Signed)
Case Management Note  Patient Details  Name: Kristin [name] MRN: [id number] Date of Birth: 1930/12/22  Subjective/Objective:     Admitted with   pathologic fracture of the right femoral neck. From home with daughter.             Action/Plan:   Expected Discharge Date:                  Expected Discharge Plan:  [facility])  In-House Referral:     Discharge planning Services  CM Consult  Post Acute Care Choice:    Choice offered to:     DME Arranged:    DME Agency:     HH Arranged:    HH Agency:     Status of Service:  In process, will continue to follow  If discussed at Long Length of Stay Meetings, dates discussed:    Additional Comments:  Sharin Mons, RN 01/19/2017, 9:35 AM

## 2017-01-19 NOTE — H&P (View-Only) (Signed)
ORTHOPAEDIC CONSULTATION  REQUESTING PHYSICIAN: Rai, Vernelle Emerald, MD  Chief Complaint: Right Hip pain  Assessment / Plan: Principal Problem:   Hip fracture, pathological (HCC) Active Problems:   HTN (hypertension)   Anemia in neoplastic disease   Multiple myeloma (HCC)   CKD (chronic kidney disease), stage III   Acute lower UTI   Pathologic hip fracture (HCC)   Hyperkalemia  Possible Operative fixation as soon as this afternoon. Discussion with daughter and patient about possible surgery pending  -NPO  -Medicine team to admit and pre-op clearance -Type and Cross for 2 units held for OR -PT/OT post op -Will ammend WB status postop, bedrest for now  HPI: Kristin [name] is a 83 y.o. female who complains of right hip pain worsening over the past 2 weeks becoming severe and limiting ability to bear weight. Presented to the ED where x-rays showed pathologic fracture of the right femoral neck. A large lytic lesion in the superior aspect of the lateral femoral head is identified.  Orthopedics was consulted for evaluation.  Her treatment is complicated considering multiple myeloma currently receiving palliative treatments.  The risks benefits and alternatives of the procedure were discussed with the patient.  The main benefits and goals of surgery would be to relieve pain and allow for the possibility of bearing weight.  The patient says that she understands the risks of the surgery and would like to have the surgery, but will have additional discussions with her daughter this morning to arrive a final decision.  Past Medical History:  Diagnosis Date  . Allergy   . Arthritis   . Cancer ([place])   . Chronic kidney disease (CKD), stage III (moderate)    Archie Endo 09/23/2016  . Depression    Risperdal "twice/day" (09/23/2016)  . Glaucoma   . Hiatal hernia   . Hypertension   . Intractable back pain    /notes 09/23/2016  . Obesity   . Osteoporosis    vertebral fracture in 2012  . Upper  GI bleed 04/2004   secondary to Mallory-Weiss tear/notes 12/28/2010  . Vertigo    Past Surgical History:  Procedure Laterality Date  . ABDOMINAL HYSTERECTOMY  1974   partial  . APPENDECTOMY     as a child/notes 12/28/2010  . BACK SURGERY    . BREAST BIOPSY Left 1999   Archie Endo 12/28/2010  . CATARACT EXTRACTION, BILATERAL Bilateral 2000s  . ESOPHAGOGASTRODUODENOSCOPY  04/2004   Archie Endo 12/28/2010  . KYPHOPLASTY  05/2011   T10/notes 05/25/2011  . LAPAROSCOPIC CHOLECYSTECTOMY  10/2007   Archie Endo 12/14/2010   Social History   Social History  . Marital status: Widowed    Spouse name: N/A  . Number of children: N/A  . Years of education: N/A   Social History Main Topics  . Smoking status: Never Smoker  . Smokeless tobacco: Never Used  . Alcohol use Yes     Comment: 09/23/2016 "glass of wine a few times/year"  . Drug use: No  . Sexual activity: No   Other Topics Concern  . None   Social History Narrative  . None   Family History  Problem Relation Age of Onset  . Hypertension Mother   . Osteoporosis Mother   . CVA Mother   . Cancer Neg Hx    Allergies  Allergen Reactions  . Alendronate Nausea Only and Other (See Comments)    Hip pain  . Penicillins Rash    Has patient had a PCN reaction causing immediate rash,  facial/tongue/throat swelling, SOB or lightheadedness with hypotension: no Has patient had a PCN reaction causing severe rash involving mucus membranes or skin necrosis: no Has patient had a PCN reaction that required hospitalization no Has patient had a PCN reaction occurring within the last 10 years:no If all of the above answers are "NO", then may proceed with Cephalosporin use.    . Sulfonamide Derivatives Itching and Rash   Prior to Admission medications   Medication Sig Start Date End Date Taking? Authorizing Provider  amLODipine (NORVASC) 5 MG tablet Take 1 tablet (5 mg total) by mouth daily. 02/19/14  Yes Cherre Robins, PharmD  bisacodyl (DULCOLAX) 10 MG  suppository Place 1 suppository (10 mg total) rectally daily as needed for moderate constipation. 06/21/16  Yes Debbe Odea, MD  dexamethasone (DECADRON) 4 MG tablet Take 5 tablets (20 mg total) by mouth once a week. 12/05/16  Yes Truitt Merle, MD  feeding supplement, ENSURE ENLIVE, (ENSURE ENLIVE) LIQD Take 237 mLs by mouth 2 (two) times daily between meals. 10/25/16  Yes Florencia Reasons, MD  magnesium oxide (MAG-OX) 400 (241.3 Mg) MG tablet Take 1 tablet (400 mg total) by mouth daily. 10/26/16  Yes Sheikh, Omair Latif, DO  meclizine (ANTIVERT) 25 MG tablet TAKE ONE TABLET BY MOUTH THREE TIMES DAILY AS NEEDED Patient taking differently: Take 25 mg by mouth 3 (three) times daily as needed for dizziness. TAKE ONE TABLET BY MOUTH THREE TIMES DAILY AS NEEDED 06/20/14  Yes Chipper Herb, MD  mirtazapine (REMERON) 7.5 MG tablet TAKE 1 TABLET (7.5 MG TOTAL) BY MOUTH AT BEDTIME. 01/16/17  Yes Truitt Merle, MD  morphine ([name]) 30 MG 12 hr tablet Take 30 mg by mouth every 12 (twelve) hours. 01/13/17  Yes [provider]  ondansetron (ZOFRAN ODT) 4 MG disintegrating tablet Take 1-2 tablets (4-8 mg total) by mouth every 8 (eight) hours as needed for nausea or vomiting. 11/17/16  Yes Truitt Merle, MD  Oxycodone HCl 10 MG TABS Take 1 tablet (10 mg total) by mouth every 4 (four) hours. 01/02/17  Yes Truitt Merle, MD  RESTASIS 0.05 % ophthalmic emulsion Place 1 drop into both eyes daily.  01/31/13  Yes [provider]  risperiDONE (RISPERDAL) 0.5 MG tablet Take 0.5 mg by mouth 2 (two) times daily.   Yes [provider]  sennosides-docusate sodium (SENOKOT-S) 8.6-50 MG tablet Take 2 tablets by mouth daily.   Yes [provider]  lenalidomide (REVLIMID) 15 MG capsule Take 1 capsule (15 mg total) by mouth daily. Take 15 mg daily for  21  Days  ,  Off  7  Days. 12/29/16   Truitt Merle, MD   Ct Hip Right Wo Contrast  Result Date: 01/18/2017 CLINICAL DATA:  Chronic right hip pain in patient with history of  multiple myeloma. No known injury. Initial encounter. EXAM: CT OF THE RIGHT HIP WITHOUT CONTRAST TECHNIQUE: Multidetector CT imaging of the right hip was performed according to the standard protocol. Multiplanar CT image reconstructions were also generated. COMPARISON:  Plain films right hip 01/02/2017. FINDINGS: Bones/Joint/Cartilage The patient has a fracture through the right femoral neck. The fracture is mainly basicervical. There is a large lytic lesion in the superior aspect of the lateral femoral head. Extensive bony loss is present at the site of the fracture consistent with a pathologic injury in this patient with a history of multiple myeloma. There may be an incomplete and nondisplaced fracture of the right inferior pubic ramus. No other fracture is identified. All  imaged bones demonstrate findings consistent with diffuse myelomatous involvement with loss of medullary bone and extensive endosteal scalloping. Ligaments Suboptimally assessed by CT. Muscles and Tendons Intact. Soft tissues Some soft tissue edema about the right hip. Atherosclerotic calcifications are noted. IMPRESSION: Findings consistent with a pathologic fracture of the right femoral neck. A large lytic lesion in the superior aspect of the lateral femoral head is identified. Possible nondisplaced fracture of the right inferior pubic ramus. Diffuse myeloma. Electronically Signed   By: Inge Rise M.D.   On: 01/18/2017 15:59   Dg Chest Port 1 View  Result Date: 01/18/2017 CLINICAL DATA:  patient from home, hx multiple myeloma. Daughter noticed light pink spotting in brief beginning last night. Acute/chronic right hip pain. Denies abdominal pain, N/V/D, chest pain and SOB. Hx hypertension - hx chronic kidney disease EXAM: PORTABLE CHEST - 1 VIEW COMPARISON:  10/22/2016 FINDINGS: Low lung volumes.  No focal infiltrate or overt edema. Heart size upper limits normal.  Tortuous atheromatous aorta. No effusion. Degenerative spurring in  bilateral shoulders. Changes of previous mid thoracic vertebral augmentation. IMPRESSION: Low volumes.  No acute disease. Electronically Signed   By: Lucrezia Europe M.D.   On: 01/18/2017 20:19    Positive ROS: All other systems have been reviewed and were otherwise negative with the exception of those mentioned in the HPI and as above.  Objective: Labs cbc  Recent Labs  01/18/17 1803 01/19/17 0644  WBC 6.9 4.8  HGB 8.6* 8.1*  HCT 26.0* 25.0*  PLT 165 168    Labs inflam No results for input(s): CRP in the last 72 hours.  Invalid input(s): ESR  Labs coag No results for input(s): INR, PTT in the last 72 hours.  Invalid input(s): PT   Recent Labs  01/18/17 1803 01/19/17 0644  NA 138 135  K 5.4* 4.4  CL 105 104  CO2 24 23  GLUCOSE 153* 106*  BUN 37* 28*  CREATININE 1.11* 0.91  CALCIUM 7.2* 6.6*    Physical Exam: Vitals:   01/19/17 0152 01/19/17 0325  BP: (!) 115/48 (!) 120/59  Pulse: 82 87  Resp: (!) 22 20  Temp: 97.9 F (36.6 C) 97.8 F (36.6 C)   General: Alert, no acute distress Mental status: Alert and Oriented x3 Neurologic: Speech Clear and organized, no gross focal findings or movement disorder appreciated. Respiratory: No cyanosis, no use of accessory musculature Cardiovascular: No pedal edema GI: Abdomen is soft and non-tender, non-distended. Skin: Warm and dry.  No lesions in the area of chief complaint  Extremities: Warm w/o pitting edema Psychiatric: Patient is competent for consent with normal mood and affect  MUSCULOSKELETAL:  Right leg pain with range of motion at the hip. Externally rotated. Feet warm. Neurovascularly intact distally. EHL, FHL, dorsiflexion, plantar flexion intact. Other extremities are atraumatic with painless ROM and NVI.   Prudencio Burly III PA-C 01/19/2017 8:33 AM

## 2017-01-19 NOTE — Progress Notes (Signed)
Paged Carlyon Shadow Tech and requested overhead patient helper or trapeze, as per order. He said it would be done in the am.

## 2017-01-19 NOTE — Interval H&P Note (Signed)
History and Physical Interval Note:  01/19/2017 11:07 AM  Kristin [name]  has presented today for surgery, with the diagnosis of HIP FRACTURE  The various methods of treatment have been discussed with the patient and family. After consideration of risks, benefits and other options for treatment, the patient has consented to  Procedure(s): RIGHT HIP HEMIARTHROPLASTY (Right) as a surgical intervention .  The patient's history has been reviewed, patient examined, no change in status, stable for surgery.  I have reviewed the patient's chart and labs.  Questions were answered to the patient's satisfaction.     Ranard Harte D

## 2017-01-19 NOTE — Progress Notes (Signed)
Initial Nutrition Assessment  DOCUMENTATION CODES:   Obesity unspecified  INTERVENTION:   -RD will follow for diet advancement and supplement as appropriate  NUTRITION DIAGNOSIS:   Inadequate oral intake related to inability to eat as evidenced by NPO status.  GOAL:   Patient will meet greater than or equal to 90% of their needs  MONITOR:   Diet advancement, Labs, Weight trends, Skin, I & O's  REASON FOR ASSESSMENT:   Consult Hip fracture protocol  ASSESSMENT:   Kristin [name] is a 83 y.o. female with medical history significant for hypertension, chronic kidney disease stage III, depression, and multiple myeloma receiving palliative treatments, now presenting to the emergency department for evaluation of severe right hip pain and blood spots in her underwear  Pt admitted with rt hip fx.  Pt is followed by hospice, however, per Palliative Care note, pt desires to be full code until fully recovered from surgery, which she will then revert back to DNR.   Pt either receiving nursing care or down in OR at time of visit. Family has elected to undergo operative fixation this afternoon.   Reviewed wt hx, which reveals a 11.3% wt loss over the past 4 months. Wt has been stable over the past 3 months.   Unable to complete Nutrition-Focused physical exam at this time.   Labs reviewed: CBGS: CBGS: 106-116.   Diet Order:  Diet NPO time specified Diet NPO time specified Except for: Sips with Meds  Skin:  Reviewed, no issues  Last BM:  01/16/17  Height:   Ht Readings from Last 1 Encounters:  01/18/17 4' 9" (1.448 m)    Weight:   Wt Readings from Last 1 Encounters:  01/18/17 140 lb 14 oz (63.9 kg)    Ideal Body Weight:  43.2 kg  BMI:  Body mass index is 30.48 kg/m.  Estimated Nutritional Needs:   Kcal:  1600-1800  Protein:  75-90 grams  Fluid:  1.6-1.8 L  EDUCATION NEEDS:   No education needs identified at this time   A. , RD, LDN,  CDE Pager: [phone] After hours Pager: [phone] 

## 2017-01-20 ENCOUNTER — Encounter (HOSPITAL_COMMUNITY): Payer: Self-pay | Admitting: Orthopedic Surgery

## 2017-01-20 ENCOUNTER — Telehealth: Payer: Self-pay | Admitting: *Deleted

## 2017-01-20 DIAGNOSIS — D649 Anemia, unspecified: Secondary | ICD-10-CM

## 2017-01-20 DIAGNOSIS — R630 Anorexia: Secondary | ICD-10-CM

## 2017-01-20 LAB — CBC
HEMATOCRIT: 20.1 % — AB (ref 36.0–46.0)
HEMOGLOBIN: 6.4 g/dL — AB (ref 12.0–15.0)
MCH: 33 pg (ref 26.0–34.0)
MCHC: 31.8 g/dL (ref 30.0–36.0)
MCV: 103.6 fL — AB (ref 78.0–100.0)
Platelets: 148 10*3/uL — ABNORMAL LOW (ref 150–400)
RBC: 1.94 MIL/uL — AB (ref 3.87–5.11)
RDW: 16.1 % — ABNORMAL HIGH (ref 11.5–15.5)
WBC: 5.5 10*3/uL (ref 4.0–10.5)

## 2017-01-20 LAB — BASIC METABOLIC PANEL
Anion gap: 9 (ref 5–15)
BUN: 15 mg/dL (ref 6–20)
CALCIUM: 6.4 mg/dL — AB (ref 8.9–10.3)
CO2: 23 mmol/L (ref 22–32)
Chloride: 106 mmol/L (ref 101–111)
Creatinine, Ser: 0.86 mg/dL (ref 0.44–1.00)
GFR calc Af Amer: >60 mL/min (ref >60)
GLUCOSE: 134 mg/dL — AB (ref 65–99)
Potassium: 3.8 mmol/L (ref 3.5–5.1)
Sodium: 138 mmol/L (ref 135–145)

## 2017-01-20 LAB — URINE CULTURE

## 2017-01-20 LAB — HEMOGLOBIN AND HEMATOCRIT, BLOOD
HEMATOCRIT: 20.7 % — AB (ref 36.0–46.0)
Hemoglobin: 6.6 g/dL — CL (ref 12.0–15.0)

## 2017-01-20 LAB — GLUCOSE, CAPILLARY
GLUCOSE-CAPILLARY: 121 mg/dL — AB (ref 65–99)
GLUCOSE-CAPILLARY: 121 mg/dL — AB (ref 65–99)
GLUCOSE-CAPILLARY: 141 mg/dL — AB (ref 65–99)
Glucose-Capillary: 124 mg/dL — ABNORMAL HIGH (ref 65–99)
Glucose-Capillary: 133 mg/dL — ABNORMAL HIGH (ref 65–99)
Glucose-Capillary: 121 mg/dL — ABNORMAL HIGH (ref 65–99)
Glucose-Capillary: 125 mg/dL — ABNORMAL HIGH (ref 65–99)

## 2017-01-20 LAB — POTASSIUM: POTASSIUM: 3.9 mmol/L (ref 3.5–5.1)

## 2017-01-20 LAB — PREPARE RBC (CROSSMATCH)

## 2017-01-20 MED ORDER — ENSURE ENLIVE PO LIQD
237.0000 mL | Freq: Three times a day (TID) | ORAL | Status: DC
  Administered 2017-01-20 – 2017-01-23 (×7): 237 mL via ORAL

## 2017-01-20 MED ORDER — SODIUM CHLORIDE 0.9 % IV SOLN
Freq: Once | INTRAVENOUS | Status: AC
  Administered 2017-01-20: 10:00:00 via INTRAVENOUS

## 2017-01-20 MED ORDER — ACETAMINOPHEN 325 MG PO TABS
650.0000 mg | ORAL_TABLET | Freq: Once | ORAL | Status: AC
  Administered 2017-01-20: 650 mg via ORAL
  Filled 2017-01-20: qty 2

## 2017-01-20 MED ORDER — ACETAMINOPHEN 325 MG PO TABS: 650.0000 mg | ORAL_TABLET | Freq: Four times a day (QID) | ORAL | Status: DC | PRN

## 2017-01-20 NOTE — Progress Notes (Signed)
Calcium 6.4  On call MD notified.  Will continue to monitor.

## 2017-01-20 NOTE — Progress Notes (Signed)
PT Cancellation Note  Patient Details Name: Kristin [name] MRN: [id number] DOB: [birth year]   Cancelled Treatment:    Reason Eval/Treat Not Completed: Patient declined, no reason specified. Pt requesting PT check back tomorrow for evaluation.    Scheryl Marten PT, DPT  [phone]  01/20/2017, 3:27 PM

## 2017-01-20 NOTE — Progress Notes (Signed)
PT Cancellation Note  Patient Details Name: [name] MRN: [id number] DOB: 07-16-1931   Cancelled Treatment:    Reason Eval/Treat Not Completed: Medical issues which prohibited therapy. HGB 6.4/ RN starting blood transfusion now. Will check back later as time allows.    Scheryl Marten PT, DPT  [phone]  01/20/2017, 11:19 AM

## 2017-01-20 NOTE — Progress Notes (Signed)
[facility] MSW note: This is a related hospice admission. Pt is a full code. Met with daughters-Brenda, Katharine Look and Constance Holster. Pt fell asleep during visit. Pt only spoke one word to this MSW . Daughters reported that pt has not been out of bed yet since her surgery yesterday. Daughters desire for pt to go to rehab after hospitalization. Family understands that they will choose to revoke hpcg services once pt goes to rehab in order for medicare to pay facility. Family is also desiring to choose aggressive treatment for pt's cancer if given the option once they discuss with Dr. Burr Medico. Family understands that this aggressive treatment may not be in the hospice plan of care. MSW discussed discharge from [facility] process if pt goes to rehab. MSW met with Percell Locus, social worker to discuss case.   Marilynne Halsted, MSW [phone]

## 2017-01-20 NOTE — Progress Notes (Signed)
OT Cancellation Note  Patient Details Name: Kristin [name] MRN: [id number] DOB: May 03, 1931   Cancelled Treatment:    Reason Eval/Treat Not Completed: Medical issues which prohibited therapy (Pt with hgb of 6.4. Will follow.)  [name] 01/20/2017, 8:05 AM  [phone]

## 2017-01-20 NOTE — Progress Notes (Signed)
CM received consult:  Equipment     Home health needs    Medication needs    Other (see comments)       Comments   PT / OT / SLP DME as needed   Pt currently active with [facility]. [facility] follows daily and will assist pt with home health needs @ d/c. CM will continue to follow as needs presents. Whitman Hero RN,BSN,CM

## 2017-01-20 NOTE — Progress Notes (Signed)
CRITICAL VALUE ALERT  Critical Value:  Hgb 6.4  Date & Time Notied:  6/8//18 0736  Provider Notified: Dr. Tana Coast [phone]  Orders Received/Actions taken: repeat H&H ordered

## 2017-01-20 NOTE — Progress Notes (Signed)
   Assessment: 1 Day Post-Op  S/P Procedure(s) (LRB): RIGHT HIP HEMIARTHROPLASTY (Right) by Dr. Ernesta Amble. Percell Miller on 01/19/17  Principal Problem:   Hip fracture, pathological ([place]) Active Problems:   HTN (hypertension)   Anemia in neoplastic disease   Multiple myeloma (HCC)   CKD (chronic kidney disease), stage III   Acute lower UTI   Pathologic hip fracture (HCC)   Hyperkalemia   Palliative care by specialist  ADDITIONAL DIAGNOSIS:  Acute Blood Loss Anemia, Hgb 6.4 < 8.1, 8.1 (pre and post-op).  Likely with dilutional component.  No sign of active bleeding.  Intra-op EBL ~250 ml.  Feeling a bit weak this morning.    Pain significantly improved and overall feeling better than pre-op.  Feeling weak - likely dt chronic dz and anemia.   Plan: Up with therapy as tolerated Incentive Spirometry Apply ice  Weight Bearing: Weight Bearing as Tolerated (WBAT) RLE.  Posterior Hip precautions.  Knee immobilizer only needed QHS to facilitate Posterior hip precautions. Dressings: PRN.  VTE prophylaxis:     Lovenox recommended - Held dt ABLA - Restart when Hgb acceptable.  SCDs, ambulation.  Recommend Lovenox for 30 days post op. Dispo: Per primary.    Subjective: Patient reports pain as mild to moderate, improved, and controlled.  Feeling "a little weak."  Objective:   VITALS:   Vitals:   01/20/17 0000 01/20/17 0101 01/20/17 0436 01/20/17 0620  BP: (!) 127/50 113/61 (!) 124/48   Pulse: 93 (!) 101 (!) 102   Resp:   20   Temp: 98.7 F (37.1 C) 98 F (36.7 C) (!) 100.8 F (38.2 C) 99 F (37.2 C)  TempSrc: Oral   Oral  SpO2: 97%  97%   Weight:      Height:       CBC Latest Ref Rng & Units 01/20/2017 01/19/2017 01/19/2017  WBC 4.0 - 10.5 K/uL 5.5 4.4 4.8  Hemoglobin 12.0 - 15.0 g/dL 6.4(LL) 8.1(L) 8.1(L)  Hematocrit 36.0 - 46.0 % 20.1(L) 25.7(L) 25.0(L)  Platelets 150 - 400 K/uL 148(L) 138(L) 168   BMP Latest Ref Rng & Units 01/20/2017 01/20/2017 01/19/2017  Glucose 65 - 99 mg/dL 134(H) -  -  BUN 6 - 20 mg/dL 15 - -  Creatinine 0.44 - 1.00 mg/dL 0.86 - -  BUN/Creat Ratio 11 - 26 - - -  Sodium 135 - 145 mmol/L 138 - -  Potassium 3.5 - 5.1 mmol/L 3.8 3.9 4.3  Chloride 101 - 111 mmol/L 106 - -  CO2 22 - 32 mmol/L 23 - -  Calcium 8.9 - 10.3 mg/dL 6.4(LL) - -   Intake/Output      06/07 0701 - 06/08 0700 06/08 0701 - 06/09 0700   P.O. 1200    I.V. (mL/kg) 2295.8 (35.9)    IV Piggyback 300    Total Intake(mL/kg) 3795.8 (59.4)    Urine (mL/kg/hr) 1375 (0.9)    Blood 250 (0.2)    Total Output 1625     Net +2170.8           Physical Exam: General: NAD.  Supine in bed.  Conversant.  No increased WOB. MSK Neurovascularly intact Sensation intact distally Feet warm Dorsiflexion/Plantar flexion intact Incision: dressing C/D/I   Prudencio Burly III, PA-C 01/20/2017, 8:01 AM

## 2017-01-20 NOTE — Telephone Encounter (Signed)
Received call from Melanie/RN/Palliative Team stating pt was previously on hospice but now wants to persue aggressive treatment.  She just had hip surgery & will be sent to SNF when ready for d/c.  Pt is in room 2, 5W at [facility]/  Notified Dr Burr Medico.

## 2017-01-20 NOTE — Progress Notes (Signed)
[facility]                                                                              Patient Demographics  Kristin [name], is a 83 y.o. female, DOB - October 10, 1930, RWE:[id number]  Admit date - 01/18/2017   Admitting Physician [name], MD  Outpatient Primary MD for the patient is [name], MD  Outpatient specialists:   LOS - 2  days   Medical records reviewed and are as summarized below:    Chief Complaint  Patient presents with  . Vaginal Bleeding       Brief summary   Patient is a 83 year old female with hypertension, CKD stage III, depression, multiple myeloma receiving palliative treatments presented to the ED after severe right hip pain. Patient reported being bedbound for past 2 weeks due to severe pain in the right hip and prior to this she was able to walk with a walker. Patient's daughter also noted some pink spots that appear to be blood on her underwear last night. CT of the hip showed pathology to fracture in the right femoral neck with large lytic lesion at the superior aspect of the lateral femoral head and diffuse myeloma. UA was positive for UTI and grossly bloody.   Assessment & Plan    Principal Problem:  Pathologic right hip fracture Secondary to multiple myeloma -Patient presented with severe right hip pain, found to have pathological right hip fracture with a large lytic lesion on the CT scan.  - Orthopedics was consulted and patient underwent right hip hemiarthroplasty on 6/7, postop day #1 - Hemoglobin 6.4, will transfuse 2 units packed RBCs - Pain control, DVT prophylaxis, PT orders per orthopedics - Bowel regimen added   Multiple myeloma  -  follows oncology, Dr. Burr Medico palliative treatments  - She was treated with lenalidomide, currently on hold.     Hyperkalemia  - Serum potassium 5.4 on admission without EKG changes  - Likely secondary to renal insufficiency and potassium currently stable     acute on chronic  blood loss anemia secondary to surgery, chronic Macrocytic anemia -  transfuse 2 units packed RBCs  - Patient has chronic anemia secondary to multiple myeloma   UTI  Urine cultures consistent with more than 100,000 colonies of Escherichia coli - Continue IV Rocephin  Hypertension  -  BP currently stable, Norvasc held  CKD stage III  - SCr is 1.11 on admission, improved  Nutrition - Advance diet to regular diet, DC IV fluid hydration, creatinine has improved   Code Status: Full CODE STATUS DVT Prophylaxis:   SCD's Family Communication: Discussed in detail with the patient, all imaging results, lab results explained to the patient And patient's daughter at the bedside.    Disposition Plan: I had a long discussion with patient, patient's daughter, hospice nurse at the bedside this morning ~ 8:30 AM. Explained the postoperative period to the patient and the daughter. The patient and her daughter developed the hospice orders at this time in the requested for skilled nursing facility for rehabilitation. If she wants any further treatments for the multiple myeloma she will not be  hospice candidate per the hospice nurse. She will need to enroll back into the hospice after the rehabilitation is completed.   Time Spent in minutes 35 minutes  Procedures:  RIGHT HIP HEMIARTHROPLASTY (Right)  Consultants:   Orthopedics Palliative medicine  Antimicrobials :   IV Rocephin 6/6    Medications  Scheduled Meds: . cycloSPORINE  1 drop Both Eyes Daily  . feeding supplement (ENSURE ENLIVE)  237 mL Oral TID BM  . insulin aspart  0-9 Units Subcutaneous Q4H  . mirtazapine  7.5 mg Oral QHS  . morphine  30 mg Oral Q12H  . polyethylene glycol  17 g Oral Daily  . risperiDONE  0.5 mg Oral BID  . senna-docusate  2 tablet Oral Daily   Continuous Infusions: . cefTRIAXone (ROCEPHIN)  IV Stopped (01/19/17 2356)   PRN Meds:.bisacodyl, HYDROcodone-acetaminophen, meclizine, morphine injection,  oxyCODONE, sodium phosphate   Antibiotics   Anti-infectives    Start     Dose/Rate Route Frequency Ordered Stop   01/20/17 0600  ceFAZolin (ANCEF) IVPB 2g/100 mL premix     2 g 200 mL/hr over 30 Minutes Intravenous On call to O.R. 01/19/17 1348 01/19/17 1535   01/19/17 1347  ceFAZolin (ANCEF) 2-4 GM/100ML-% IVPB    Comments:  Gallman, Kathie   : cabinet override      01/19/17 1347 01/19/17 1505   01/18/17 2200  cefTRIAXone (ROCEPHIN) 1 g in dextrose 5 % 50 mL IVPB     1 g 100 mL/hr over 30 Minutes Intravenous Every 24 hours 01/18/17 2049     01/18/17 1730  cephALEXin (KEFLEX) capsule 500 mg     500 mg Oral  Once 01/18/17 1717 01/18/17 1751        Subjective:   [name] was seen and examined today. Right hip pain 7-8/10, intermittent, worse on movement, sharp. Patient denies dizziness, chest pain, shortness of breath, abdominal pain, No acute events overnight. No BM since yesterday. Low-grade temp overnight.   Objective:   Vitals:   01/20/17 0436 01/20/17 0620 01/20/17 1109 01/20/17 1137  BP: (!) 124/48  (!) 114/45 (!) 114/43  Pulse: (!) 102  78 77  Resp: 20  20 18   Temp: (!) 100.8 F (38.2 C) 99 F (37.2 C) 98.7 F (37.1 C) 99.6 F (37.6 C)  TempSrc:  Oral Oral Oral  SpO2: 97%     Weight:      Height:        Intake/Output Summary (Last 24 hours) at 01/20/17 1401 Last data filed at 01/20/17 0800  Gross per 24 hour  Intake          3795.83 ml  Output             1875 ml  Net          1920.83 ml     Wt Readings from Last 3 Encounters:  01/18/17 63.9 kg (140 lb 14 oz)  01/02/17 64 kg (141 lb)  12/05/16 63.2 kg (139 lb 6.4 oz)     Exam  General: Alert and oriented 3, NAD    Eyes: PERRLA, EOMI    HEENT:  atraumatic, normocephalic   Cardiovascular:  S1 and S2 clear, RRR  RespiratoryCTA B  Gastrointestinal: Soft,NT, ND, NBS  Ext: no pedal edema bilaterally  Neuro: No FND  Musculoskeletal : No digital cyanosis, clubbing.  right leg in the  immobilizer   Skin: No rashes  Psych alert and oriented 3, normal affect   Data Reviewed:  I have  personally reviewed following labs and imaging studies  Micro Results Recent Results (from the past 240 hour(s))  Urine culture     Status: Abnormal   Collection Time: 01/18/17  3:24 PM  Result Value Ref Range Status   Specimen Description URINE, CLEAN CATCH  Final   Special Requests NONE  Final   Culture >=100,000 COLONIES/mL ESCHERICHIA COLI (A)  Final   Report Status 01/20/2017 FINAL  Final   Organism ID, Bacteria ESCHERICHIA COLI (A)  Final      Susceptibility   Escherichia coli - MIC*    AMPICILLIN 4 SENSITIVE Sensitive     CEFAZOLIN <=4 SENSITIVE Sensitive     CEFTRIAXONE <=1 SENSITIVE Sensitive     CIPROFLOXACIN <=0.25 SENSITIVE Sensitive     GENTAMICIN <=1 SENSITIVE Sensitive     IMIPENEM <=0.25 SENSITIVE Sensitive     NITROFURANTOIN <=16 SENSITIVE Sensitive     TRIMETH/SULFA <=20 SENSITIVE Sensitive     AMPICILLIN/SULBACTAM <=2 SENSITIVE Sensitive     PIP/TAZO <=4 SENSITIVE Sensitive     Extended ESBL NEGATIVE Sensitive     * >=100,000 COLONIES/mL ESCHERICHIA COLI  Surgical pcr screen     Status: None   Collection Time: 01/19/17 11:54 AM  Result Value Ref Range Status   MRSA, PCR NEGATIVE NEGATIVE Final   Staphylococcus aureus NEGATIVE NEGATIVE Final    Comment:        The Xpert SA Assay (FDA approved for NASAL specimens in patients over 1 years of age), is one component of a comprehensive surveillance program.  Test performance has been validated by [facility] for patients greater than or equal to 3 year old. It is not intended to diagnose infection nor to guide or monitor treatment.     Radiology Reports Pelvis Portable  Result Date: 01/19/2017 CLINICAL DATA:  Femur fracture. EXAM: PORTABLE PELVIS 1-2 VIEWS COMPARISON:  CT scan 01/18/2017 FINDINGS: One-view portable exam of the lower pelvis at 1755 hours shows the patient be status post right hip  hemiarthroplasty. Bones diffusely demineralized. No evidence for immediate hardware complications. IMPRESSION: Status post right hip hemiarthroplasty. Electronically Signed   By: Misty Stanley M.D.   On: 01/19/2017 18:42   Ct Hip Right Wo Contrast  Result Date: 01/18/2017 CLINICAL DATA:  Chronic right hip pain in patient with history of multiple myeloma. No known injury. Initial encounter. EXAM: CT OF THE RIGHT HIP WITHOUT CONTRAST TECHNIQUE: Multidetector CT imaging of the right hip was performed according to the standard protocol. Multiplanar CT image reconstructions were also generated. COMPARISON:  Plain films right hip 01/02/2017. FINDINGS: Bones/Joint/Cartilage The patient has a fracture through the right femoral neck. The fracture is mainly basicervical. There is a large lytic lesion in the superior aspect of the lateral femoral head. Extensive bony loss is present at the site of the fracture consistent with a pathologic injury in this patient with a history of multiple myeloma. There may be an incomplete and nondisplaced fracture of the right inferior pubic ramus. No other fracture is identified. All imaged bones demonstrate findings consistent with diffuse myelomatous involvement with loss of medullary bone and extensive endosteal scalloping. Ligaments Suboptimally assessed by CT. Muscles and Tendons Intact. Soft tissues Some soft tissue edema about the right hip. Atherosclerotic calcifications are noted. IMPRESSION: Findings consistent with a pathologic fracture of the right femoral neck. A large lytic lesion in the superior aspect of the lateral femoral head is identified. Possible nondisplaced fracture of the right inferior pubic ramus. Diffuse myeloma. Electronically Signed  By: Inge Rise M.D.   On: 01/18/2017 15:59   Dg Chest Port 1 View  Result Date: 01/18/2017 CLINICAL DATA:  patient from home, hx multiple myeloma. Daughter noticed light pink spotting in brief beginning last night.  Acute/chronic right hip pain. Denies abdominal pain, N/V/D, chest pain and SOB. Hx hypertension - hx chronic kidney disease EXAM: PORTABLE CHEST - 1 VIEW COMPARISON:  10/22/2016 FINDINGS: Low lung volumes.  No focal infiltrate or overt edema. Heart size upper limits normal.  Tortuous atheromatous aorta. No effusion. Degenerative spurring in bilateral shoulders. Changes of previous mid thoracic vertebral augmentation. IMPRESSION: Low volumes.  No acute disease. Electronically Signed   By: Lucrezia Europe M.D.   On: 01/18/2017 20:19   Dg Hip Unilat W Or W/o Pelvis 2-3 Views Right  Result Date: 01/02/2017 CLINICAL DATA:  Right hip pain.  No injury.  Multiple myeloma . EXAM: DG HIP (WITH OR WITHOUT PELVIS) 2-3V RIGHT COMPARISON:  06/14/2016. FINDINGS: Degenerative changes lumbar spine and both hips. No evidence of fracture or dislocation. Multiple tiny lucencies are noted in the proximal femurs bilaterally. These changes may be related to multiple mild lumbar given the patient's history. Peripheral vascular calcification. Surgical clips right upper quadrant . IMPRESSION: 1. Degenerative changes lumbar spine and both hips. No evidence of fracture or dislocation. 2. Diffuse osteopenia. Multiple tiny lucencies are noted throughout both proximal femurs. These may be secondary to the patient's known multiple myeloma. Electronically Signed   By: Marcello Moores  Register   On: 01/02/2017 13:52    Lab Data:  CBC:  Recent Labs Lab 01/18/17 1803 01/19/17 0644 01/19/17 1718 01/20/17 0636 01/20/17 0830  WBC 6.9 4.8 4.4 5.5  --   NEUTROABS 3.4  --   --   --   --   HGB 8.6* 8.1* 8.1* 6.4* 6.6*  HCT 26.0* 25.0* 25.7* 20.1* 20.7*  MCV 102.0* 103.3* 105.3* 103.6*  --   PLT 165 168 138* 148*  --    Basic Metabolic Panel:  Recent Labs Lab 01/18/17 1803 01/19/17 0644 01/19/17 1159 01/19/17 1718 01/19/17 2131 01/20/17 0021 01/20/17 0524  NA 138 135  --   --   --   --  138  K 5.4* 4.4 4.5 4.3 4.3 3.9 3.8  CL 105 104   --   --   --   --  106  CO2 24 23  --   --   --   --  23  GLUCOSE 153* 106*  --   --   --   --  134*  BUN 37* 28*  --   --   --   --  15  CREATININE 1.11* 0.91  --  0.87  --   --  0.86  CALCIUM 7.2* 6.6*  --   --   --   --  6.4*   GFR: Estimated Creatinine Clearance: 37.4 mL/min (by C-G formula based on SCr of 0.86 mg/dL). Liver Function Tests: No results for input(s): AST, ALT, ALKPHOS, BILITOT, PROT, ALBUMIN in the last 168 hours. No results for input(s): LIPASE, AMYLASE in the last 168 hours. No results for input(s): AMMONIA in the last 168 hours. Coagulation Profile: No results for input(s): INR, PROTIME in the last 168 hours. Cardiac Enzymes: No results for input(s): CKTOTAL, CKMB, CKMBINDEX, TROPONINI in the last 168 hours. BNP (last 3 results) No results for input(s): PROBNP in the last 8760 hours. HbA1C: No results for input(s): HGBA1C in the last 72 hours. CBG:  Recent  Labs Lab 01/20/17 0000 01/20/17 0414 01/20/17 0554 01/20/17 0800 01/20/17 1203  GLUCAP 133* 124* 133* 121* 125*   Lipid Profile: No results for input(s): CHOL, HDL, LDLCALC, TRIG, CHOLHDL, LDLDIRECT in the last 72 hours. Thyroid Function Tests: No results for input(s): TSH, T4TOTAL, FREET4, T3FREE, THYROIDAB in the last 72 hours. Anemia Panel: No results for input(s): VITAMINB12, FOLATE, FERRITIN, TIBC, IRON, RETICCTPCT in the last 72 hours. Urine analysis:    Component Value Date/Time   COLORURINE YELLOW 01/18/2017 1524   APPEARANCEUR CLOUDY (A) 01/18/2017 1524   LABSPEC 1.012 01/18/2017 1524   PHURINE 5.0 01/18/2017 1524   GLUCOSEU NEGATIVE 01/18/2017 1524   HGBUR LARGE (A) 01/18/2017 1524   BILIRUBINUR NEGATIVE 01/18/2017 1524   KETONESUR NEGATIVE 01/18/2017 1524   PROTEINUR 30 (A) 01/18/2017 1524   UROBILINOGEN 0.2 10/18/2007 0945   NITRITE NEGATIVE 01/18/2017 1524   LEUKOCYTESUR LARGE (A) 01/18/2017 1524     Tannie Koskela M.D. [facility] 01/20/2017, 2:01 PM  Pager:  [phone] Between 7am to 7pm - call Pager - [phone]  After 7pm go to [email or web address] - password [email or web address]  Call night coverage person covering after 7pm

## 2017-01-20 NOTE — Progress Notes (Signed)
[facility] with patient in her room. Initially daughter, Hassan Rowan was present, but she went downstairs to allow patient to speak freely with Probation officer. Patient states she is "doing the best she can" and has some pain. Hassan Rowan said patient just received pain medication.   Hassan Rowan made aware by writer and [facility] RN Margaretmary Eddy that [facility] Social Worker, Marilynne Halsted, will be visiting later this morning to discuss family's wishes concerning rehab and treatment. Chaplain completed initial spiritual care assessment, according to patient.   Patient is from [place], where she was raised [employer]alian. Patient is a member of [employer] [facility] [place]. Patient says her minister "knows everything" of her status and has visited her in the hospital, bringing communion. Confirmed with daughter, Hassan Rowan, that their pastor spent the day with them at the hospital yesterday.   Patient began to doze from pain medication, speaking of her faith in between short dozes. Patient repeated several times that she is "taking one day at a time" and "turning lemons into lemonade." Patient said her "life philosophy" is that we do not know what will come tomorrow, we have no control over it, and she and her family are "held in the hands of God." Patient's primary concern is being "incapacitated" and recovering from surgery and pursuing treatment. She asked that writer would pray that "God would take their burden" from them and "help" as they move forward.   Chaplain provided spiritual presence with affirmation of faith and facilitated prayer. Chaplain provided spiritual counsel to support coping with grief, surgery recovery, and illness. Chaplain provided spiritual support privately with daughter, Hassan Rowan.   [facility] will continue to follow. Please call with questions/concerns.  Peggye Form, MDiv, [place]

## 2017-01-20 NOTE — Progress Notes (Signed)
Kristin [name]   DOB:1931-06-12   ZO#:[id number]   JWJ#:[id number]  Oncology follow-up note  Subjective: Patient is well-known to me, under my care for her multiple myeloma. Patient was admitted to hospital for severe right hip pain, found to have right hip fracture secondary to her multiple myeloma. She underwent right hip hemiarthroplasty by Dr. Percell Miller yesterday.    Objective:  Vitals:   01/20/17 1522 01/20/17 1655  BP: (!) 120/58   Pulse: 82   Resp: 18   Temp: 100.2 F (37.9 C) 98.8 F (37.1 C)    Body mass index is 30.48 kg/m.  Intake/Output Summary (Last 24 hours) at 01/20/17 2051 Last data filed at 01/20/17 1822  Gross per 24 hour  Intake          2455.83 ml  Output             1625 ml  Net           830.83 ml    Alert and oriented   Sclerae unicteric  Oropharynx clear  No peripheral adenopathy  Lungs clear -- no rales or rhonchi  Heart regular rate and rhythm  Abdomen benign    CBG (last 3)   Recent Labs  01/20/17 1203 01/20/17 1742 01/20/17 2008  GLUCAP 125* 121* 121*     Labs:  Lab Results  Component Value Date   WBC 5.5 01/20/2017   HGB 6.6 (LL) 01/20/2017   HCT 20.7 (L) 01/20/2017   MCV 103.6 (H) 01/20/2017   PLT 148 (L) 01/20/2017   NEUTROABS 3.4 01/18/2017   CMP Latest Ref Rng & Units 01/20/2017 01/20/2017 01/19/2017  Glucose 65 - 99 mg/dL 134(H) - -  BUN 6 - 20 mg/dL 15 - -  Creatinine 0.44 - 1.00 mg/dL 0.86 - -  Sodium 135 - 145 mmol/L 138 - -  Potassium 3.5 - 5.1 mmol/L 3.8 3.9 4.3  Chloride 101 - 111 mmol/L 106 - -  CO2 22 - 32 mmol/L 23 - -  Calcium 8.9 - 10.3 mg/dL 6.4(LL) - -  Total Protein 6.0 - 8.5 g/dL - - -  Total Bilirubin 0.20 - 1.20 mg/dL - - -  Alkaline Phos 40 - 150 U/L - - -  AST 5 - 34 U/L - - -  ALT 0 - 55 U/L - - -    Urine Studies No results for input(s): UHGB, CRYS in the last 72 hours.  Invalid input(s): UACOL, UAPR, USPG, UPH, UTP, UGL, UKET, UBIL, UNIT, UROB, ULEU, UEPI, UWBC, URBC, UBAC, CAST, [place],  [place]  Basic Metabolic Panel:  Recent Labs Lab 01/18/17 1803 01/19/17 0644  01/19/17 1718  01/20/17 0021 01/20/17 0524  NA 138 135  --   --   --   --  138  K 5.4* 4.4  < > 4.3  < > 3.9 3.8  CL 105 104  --   --   --   --  106  CO2 24 23  --   --   --   --  23  GLUCOSE 153* 106*  --   --   --   --  134*  BUN 37* 28*  --   --   --   --  15  CREATININE 1.11* 0.91  --  0.87  --   --  0.86  CALCIUM 7.2* 6.6*  --   --   --   --  6.4*  < > = values in this interval not displayed. GFR Estimated  Creatinine Clearance: 37.4 mL/min (by C-G formula based on SCr of 0.86 mg/dL). Liver Function Tests: No results for input(s): AST, ALT, ALKPHOS, BILITOT, PROT, ALBUMIN in the last 168 hours. No results for input(s): LIPASE, AMYLASE in the last 168 hours. No results for input(s): AMMONIA in the last 168 hours. Coagulation profile No results for input(s): INR, PROTIME in the last 168 hours.  CBC:  Recent Labs Lab 01/18/17 1803 01/19/17 0644 01/19/17 1718 01/20/17 0636 01/20/17 0830  WBC 6.9 4.8 4.4 5.5  --   NEUTROABS 3.4  --   --   --   --   HGB 8.6* 8.1* 8.1* 6.4* 6.6*  HCT 26.0* 25.0* 25.7* 20.1* 20.7*  MCV 102.0* 103.3* 105.3* 103.6*  --   PLT 165 168 138* 148*  --    Cardiac Enzymes: No results for input(s): CKTOTAL, CKMB, CKMBINDEX, TROPONINI in the last 168 hours. BNP: Invalid input(s): POCBNP CBG:  Recent Labs Lab 01/20/17 0554 01/20/17 0800 01/20/17 1203 01/20/17 1742 01/20/17 2008  GLUCAP 133* 121* 125* 121* 121*   D-Dimer No results for input(s): DDIMER in the last 72 hours. Hgb A1c No results for input(s): HGBA1C in the last 72 hours. Lipid Profile No results for input(s): CHOL, HDL, LDLCALC, TRIG, CHOLHDL, LDLDIRECT in the last 72 hours. Thyroid function studies No results for input(s): TSH, T4TOTAL, T3FREE, THYROIDAB in the last 72 hours.  Invalid input(s): FREET3 Anemia work up No results for input(s): VITAMINB12, FOLATE, FERRITIN, TIBC, IRON,  RETICCTPCT in the last 72 hours. Microbiology Recent Results (from the past 240 hour(s))  Urine culture     Status: Abnormal   Collection Time: 01/18/17  3:24 PM  Result Value Ref Range Status   Specimen Description URINE, CLEAN CATCH  Final   Special Requests NONE  Final   Culture >=100,000 COLONIES/mL ESCHERICHIA COLI (A)  Final   Report Status 01/20/2017 FINAL  Final   Organism ID, Bacteria ESCHERICHIA COLI (A)  Final      Susceptibility   Escherichia coli - MIC*    AMPICILLIN 4 SENSITIVE Sensitive     CEFAZOLIN <=4 SENSITIVE Sensitive     CEFTRIAXONE <=1 SENSITIVE Sensitive     CIPROFLOXACIN <=0.25 SENSITIVE Sensitive     GENTAMICIN <=1 SENSITIVE Sensitive     IMIPENEM <=0.25 SENSITIVE Sensitive     NITROFURANTOIN <=16 SENSITIVE Sensitive     TRIMETH/SULFA <=20 SENSITIVE Sensitive     AMPICILLIN/SULBACTAM <=2 SENSITIVE Sensitive     PIP/TAZO <=4 SENSITIVE Sensitive     Extended ESBL NEGATIVE Sensitive     * >=100,000 COLONIES/mL ESCHERICHIA COLI  Surgical pcr screen     Status: None   Collection Time: 01/19/17 11:54 AM  Result Value Ref Range Status   MRSA, PCR NEGATIVE NEGATIVE Final   Staphylococcus aureus NEGATIVE NEGATIVE Final    Comment:        The Xpert SA Assay (FDA approved for NASAL specimens in patients over 56 years of age), is one component of a comprehensive surveillance program.  Test performance has been validated by [facility] for patients greater than or equal to 6 year old. It is not intended to diagnose infection nor to guide or monitor treatment.       Studies:  Pelvis Portable  Result Date: 01/19/2017 CLINICAL DATA:  Femur fracture. EXAM: PORTABLE PELVIS 1-2 VIEWS COMPARISON:  CT scan 01/18/2017 FINDINGS: One-view portable exam of the lower pelvis at 1755 hours shows the patient be status post right hip hemiarthroplasty. Bones diffusely demineralized.  No evidence for immediate hardware complications. IMPRESSION: Status post right hip  hemiarthroplasty. Electronically Signed   By: Misty Stanley M.D.   On: 01/19/2017 18:42    Assessment: 83 y.o. with multiple myeloma, was admitted for right hip fracture, status post right hip hemiarthroplasty  1. Right hip pathological fracture, s/p hemiarthroplasty 2. Multiple myeloma 3. Acute on chronic anemia, secondary to surgery and her underline MM 4. HTN 5. CKD stage III 6. Anorexia    Plan:  -Pt previously enrolled to hospice due to unbearable right hip pain. She has had right hip surgery, I anticipate that she will recover well -Patient wishes to Crestor from hospice, and he resume treatment for her multiple myeloma. I think it is reasonable and I agree. -She previously responded to dexamethasone and Revlimid, I recommend her to hold for now due to the surgery. I will consider to restart it in 3-4 weeks when I see her back in my clinic  -I agree with blood transfusion to keep Hb>8.0 -Patient's daughter is very concerned about her low appetite, I recommend Megace, we discussed that slightly increased risk of thrombosis from Megace. She is also at a significant risk of thrombosis due to her hip surgery, immobility, and underlying malignancy. I recommend her to have prophylactic Lovenox 40 mg daily, if is okay with her orthopedic surgeon and primary team. I'll defer this to the primary care team to order Lovenox and Megace if they agree.  -I plan to see her back in 3-4 weeks. -I will follow up as needed. Please do not hesitate to call me if anything I can help with.  -I appreciate the excellent care from the hospitalist team, palliative care service and orthpedics   Truitt Merle, MD 01/20/2017  8:51 PM

## 2017-01-20 NOTE — Progress Notes (Signed)
Palliative Medicine RN Note: Rec'd message that pt will be revoking hospice and going to SNF at d/c. POD #1 hip repair. Spoke with Dr Tana Coast; pt will be getting two units PRBC today, and she agrees with consulting Dr Burr Medico if it is in line with pt's goals. I obtained the order from PMT medical director, and I called the referral in to Dr Ernestina Penna nurse [place] at [phone].  Marjie Skiff Tavaras Goody, RN, BSN, [facility] 01/20/2017 9:40 AM Cell [phone] 8:00-4:00 Monday-Friday Office [phone]

## 2017-01-20 NOTE — Progress Notes (Signed)
Pts. tempeture at 1451-98.3 nurse retook temp at the end of pts.1st unit of blood temp was 100.2 pt currently sleeping and is warm to touch MD notified. Will continue to monitor

## 2017-01-20 NOTE — Progress Notes (Addendum)
0900--[facility] [place] - GIP RN visit  This is a related and covered GIP hospital admission of 01/18/17 with [facility] DX of Multiple Myeloma per Dr. Antonieta Loveless.  Patient was admitted from home after complaints of R hip pain worsening over the past 2 weeks where Xrays show a pathologic fracture of the right femoral neck. [facility] was not notified prior to patient arriving in the ED for evaluation of vaginal bleeding.  Pt changed code status 01/19/17 from DNR to Full Code.  Pt underwent R hip hemiarthroplasty 01/19/17.   Visited with patient in room, at this time she denies any pain. Daughter Hassan Rowan arrived during visit and states that pt and family will be seeking rehab after hospitalization and further treatment for multiple myeloma.  PMT office called to inform of pt and family wishes for further treatment. PMT will reach out to Dr. Burr Medico for further evaluation while patient in the hospital.  Dr. Tana Coast and team rounding. Hgb 6.4. 2 units PRBC ordered for today.  Pt on room air, sats 97%. LR had been running at 125 cc/hr, now discontinued at 0826. Ceftriaxone 1G Q 24 hours. Receiving [name] 30 mg Q 12 hours scheduled and has received Norco 5-325 2 tablets x 1 at 0516 and Morphine 2 mg IV at 0121 am.  [facility] CSW, Marilynne Halsted to visit today to further discuss pt and family wishes regarding further treatment and revocation.  [facility] will continue to follow and anticipate any discharge needs.  Please feel free to call with any hospice related questions and/or concerns.  Thank you, Margaretmary Eddy, [place] [phone]  [place] are on AMION.

## 2017-01-20 NOTE — Progress Notes (Signed)
Nutrition Follow-up  DOCUMENTATION CODES:   Obesity unspecified  INTERVENTION:   -Ensure Enlive po TID, each supplement provides 350 kcal and 20 grams of protein  NUTRITION DIAGNOSIS:   Inadequate oral intake related to poor appetite as evidenced by meal completion < 50%, per patient/family report.  Ongoing  GOAL:   Patient will meet greater than or equal to 90% of their needs  Progressing  MONITOR:   PO intake, Supplement acceptance, Labs, Weight trends, Skin, I & O's  REASON FOR ASSESSMENT:   Consult Hip fracture protocol  ASSESSMENT:   Kristin [name] is a 83 y.o. female with medical history significant for hypertension, chronic kidney disease stage III, depression, and multiple myeloma receiving palliative treatments, now presenting to the emergency department for evaluation of severe right hip pain and blood spots in her underwear  S/P Procedure(s) (LRB) on 01/19/17: RIGHT HIP HEMIARTHROPLASTY (Right)  Pt sleepy at time of visit. Hx obtained from pt daughter at bedside, who reports minimal intake ("next to nothing") over the past week related to dehydration and pain. Per pt daughter, pt generally consumes 3 meals per day, but portions are very small- pt consumes about 50% less than she used to. Daughter has been supplementing with 2-3 Ensure Plus supplements daily, which pt enjoys. She estimates pt consumed about 25% of breakfast, but suspects intake will increase now that pain is better controlled.   Nutrition-Focused physical exam deferred at this time per pt daughter request, due to pt's pain level. No signs of fat or muscle depletion noted on orbital, temple, or clavicle regions.   Pt daughter requesting Ensure supplement per home regimen, which RD will order. Discussed importance of good meal and supplement intake to promote healing.   Reviewed palliative care not from this morning; pt's family is revoking hospice benefit in order for pt to discharge to SNF.    Labs reviewed: CBGS: 121-125.  Diet Order:  Diet Carb Modified Fluid consistency: Thin; Room service appropriate? Yes  Skin:   (closed rt hip incision)  Last BM:  01/16/17  Height:   Ht Readings from Last 1 Encounters:  01/18/17 4' 9"  (1.448 m)    Weight:   Wt Readings from Last 1 Encounters:  01/18/17 140 lb 14 oz (63.9 kg)    Ideal Body Weight:  43.2 kg  BMI:  Body mass index is 30.48 kg/m.  Estimated Nutritional Needs:   Kcal:  1600-1800  Protein:  75-90 grams  Fluid:  1.6-1.8 L  EDUCATION NEEDS:   Education needs addressed  Kristin [name] A. Jimmye Norman, RD, LDN, CDE Pager: [phone] After hours Pager: [phone]

## 2017-01-21 LAB — BASIC METABOLIC PANEL
ANION GAP: 8 (ref 5–15)
BUN: 16 mg/dL (ref 6–20)
CHLORIDE: 106 mmol/L (ref 101–111)
CO2: 24 mmol/L (ref 22–32)
CREATININE: 0.76 mg/dL (ref 0.44–1.00)
Calcium: 6.6 mg/dL — ABNORMAL LOW (ref 8.9–10.3)
GFR calc non Af Amer: >60 mL/min (ref >60)
Glucose, Bld: 98 mg/dL (ref 65–99)
Potassium: 3.8 mmol/L (ref 3.5–5.1)
SODIUM: 138 mmol/L (ref 135–145)

## 2017-01-21 LAB — TYPE AND SCREEN
ABO/RH(D): O POS
ANTIBODY SCREEN: NEGATIVE
UNIT DIVISION: 0
Unit division: 0

## 2017-01-21 LAB — BPAM RBC
BLOOD PRODUCT EXPIRATION DATE: 201806272359
Blood Product Expiration Date: 201806272359
ISSUE DATE / TIME: 201806081110
ISSUE DATE / TIME: 201806082119
UNIT TYPE AND RH: 5100
Unit Type and Rh: 5100

## 2017-01-21 LAB — CBC
HCT: 29.2 % — ABNORMAL LOW (ref 36.0–46.0)
HEMOGLOBIN: 9.5 g/dL — AB (ref 12.0–15.0)
MCH: 30.9 pg (ref 26.0–34.0)
MCHC: 32.5 g/dL (ref 30.0–36.0)
MCV: 95.1 fL (ref 78.0–100.0)
Platelets: 176 10*3/uL (ref 150–400)
RBC: 3.07 MIL/uL — AB (ref 3.87–5.11)
RDW: 19.8 % — ABNORMAL HIGH (ref 11.5–15.5)
WBC: 8.9 10*3/uL (ref 4.0–10.5)

## 2017-01-21 LAB — GLUCOSE, CAPILLARY
GLUCOSE-CAPILLARY: 86 mg/dL (ref 65–99)
GLUCOSE-CAPILLARY: 168 mg/dL — AB (ref 65–99)
Glucose-Capillary: 125 mg/dL — ABNORMAL HIGH (ref 65–99)
Glucose-Capillary: 108 mg/dL — ABNORMAL HIGH (ref 65–99)
Glucose-Capillary: 119 mg/dL — ABNORMAL HIGH (ref 65–99)

## 2017-01-21 MED ORDER — SENNOSIDES-DOCUSATE SODIUM 8.6-50 MG PO TABS
2.0000 | ORAL_TABLET | Freq: Two times a day (BID) | ORAL | Status: DC
  Administered 2017-01-21 – 2017-01-23 (×5): 2 via ORAL
  Filled 2017-01-21 (×5): qty 2

## 2017-01-21 MED ORDER — POLYETHYLENE GLYCOL 3350 17 G PO PACK
17.0000 g | PACK | Freq: Two times a day (BID) | ORAL | Status: DC
  Administered 2017-01-21 – 2017-01-23 (×5): 17 g via ORAL
  Filled 2017-01-21 (×5): qty 1

## 2017-01-21 MED ORDER — ENOXAPARIN SODIUM 40 MG/0.4ML ~~LOC~~ SOLN
40.0000 mg | SUBCUTANEOUS | Status: DC
  Administered 2017-01-21 – 2017-01-23 (×3): 40 mg via SUBCUTANEOUS
  Filled 2017-01-21 (×3): qty 0.4

## 2017-01-21 MED ORDER — MEGESTROL ACETATE 40 MG PO TABS
40.0000 mg | ORAL_TABLET | Freq: Every day | ORAL | Status: DC
  Administered 2017-01-21 – 2017-01-23 (×3): 40 mg via ORAL
  Filled 2017-01-21 (×3): qty 1

## 2017-01-21 MED ORDER — MAGNESIUM CITRATE PO SOLN
1.0000 | Freq: Once | ORAL | Status: AC
  Administered 2017-01-21: 1 via ORAL
  Filled 2017-01-21: qty 296

## 2017-01-21 MED ORDER — BISACODYL 10 MG RE SUPP
10.0000 mg | Freq: Once | RECTAL | Status: AC
  Administered 2017-01-21: 10 mg via RECTAL
  Filled 2017-01-21: qty 1

## 2017-01-21 NOTE — Progress Notes (Signed)
[facility]                                                                              Patient Demographics  Kristin [name], is a 83 y.o. female, DOB - 24-Nov-1930, IRW:[id number]  Admit date - 01/18/2017   Admitting Physician [name], MD  Outpatient Primary MD for the patient is [name], MD  Outpatient specialists:   LOS - 3  days   Medical records reviewed and are as summarized below:    Chief Complaint  Patient presents with  . Vaginal Bleeding       Brief summary   Patient is a 83 year old female with hypertension, CKD stage III, depression, multiple myeloma receiving palliative treatments presented to the ED after severe right hip pain. Patient reported being bedbound for past 2 weeks due to severe pain in the right hip and prior to this she was able to walk with a walker. Patient's daughter also noted some pink spots that appear to be blood on her underwear last night. CT of the hip showed pathology to fracture in the right femoral neck with large lytic lesion at the superior aspect of the lateral femoral head and diffuse myeloma. UA was positive for UTI and grossly bloody.   Assessment & Plan    Principal Problem:  Pathologic right hip fracture Secondary to multiple myeloma -Patient presented with severe right hip pain, found to have pathological right hip fracture with a large lytic lesion on the CT scan.  - Orthopedics was consulted and patient underwent right hip hemiarthroplasty on 6/7, postop day # 2 - Transfuse 2 units packed RBCs on 6/8, hemoglobin stable today at 9.5 - Pain control, PT orders per orthopedics - Appreciate hematology recommendations, placed on Lovenox for DVT prophylaxis and also added megace for appetite stimulant  Constipation - No BM since admission, Placed on bowel regimen with Senokot, MiraLAX, Dulcolax suppository, mag citrate   Multiple myeloma  -  follows oncology, Dr. Burr Medico palliative treatments  - She  was treated with lenalidomide, currently on hold.     Hyperkalemia  - Serum potassium 5.4 on admission without EKG changes , now resolved - Likely secondary to renal insufficiency and potassium currently stable     acute on chronic blood loss anemia secondary to surgery, chronic Macrocytic anemia -  transfused 2 units packed RBCs on 6/8, hemoglobin stable  - Patient has chronic anemia secondary to multiple myeloma  Escherichia coli UTI  Urine cultures consistent with more than 100,000 colonies of Escherichia coli, pansensitive - Continue IV Rocephin  Hypertension  -  BP currently stable, Norvasc held  CKD stage III  - SCr is 1.11 on admission, improved  Nutrition - Advance diet to regular diet, DC IV fluid hydration, creatinine has improved   Code Status: Full CODE STATUS DVT Prophylaxis:   SCD's Family Communication: Discussed in detail with the patient, all imaging results, lab results explained to the patient and patient's daughter at the bedside.    Disposition Plan:  Per plan, DC to skilled nursing facility once bed available Time Spent in minutes25 minutes  Procedures:  RIGHT HIP HEMIARTHROPLASTY (  Right)  Consultants:   Orthopedics Palliative medicine  Antimicrobials :   IV Rocephin 6/6    Medications  Scheduled Meds: . cycloSPORINE  1 drop Both Eyes Daily  . enoxaparin (LOVENOX) injection  40 mg Subcutaneous Q24H  . feeding supplement (ENSURE ENLIVE)  237 mL Oral TID BM  . insulin aspart  0-9 Units Subcutaneous Q4H  . megestrol  40 mg Oral Daily  . mirtazapine  7.5 mg Oral QHS  . morphine  30 mg Oral Q12H  . polyethylene glycol  17 g Oral BID  . risperiDONE  0.5 mg Oral BID  . senna-docusate  2 tablet Oral BID   Continuous Infusions: . cefTRIAXone (ROCEPHIN)  IV Stopped (01/20/17 2328)   PRN Meds:.acetaminophen, HYDROcodone-acetaminophen, meclizine, morphine injection, oxyCODONE, sodium phosphate   Antibiotics   Anti-infectives    Start      Dose/Rate Route Frequency Ordered Stop   01/20/17 0600  ceFAZolin (ANCEF) IVPB 2g/100 mL premix     2 g 200 mL/hr over 30 Minutes Intravenous On call to O.R. 01/19/17 1348 01/19/17 1535   01/19/17 1347  ceFAZolin (ANCEF) 2-4 GM/100ML-% IVPB    Comments:  Gallman, Kathie   : cabinet override      01/19/17 1347 01/19/17 1505   01/18/17 2200  cefTRIAXone (ROCEPHIN) 1 g in dextrose 5 % 50 mL IVPB     1 g 100 mL/hr over 30 Minutes Intravenous Every 24 hours 01/18/17 2049     01/18/17 1730  cephALEXin (KEFLEX) capsule 500 mg     500 mg Oral  Once 01/18/17 1717 01/18/17 1751        Subjective:   [name] was seen and examined today. Complaining of constipation. Daughter at the bedside reports that patient is not eating well at all. Does not like any food. Right hip pain currently controlled on the pain medications. Patient denies dizziness, chest pain, shortness of breath, abdominal pain, No acute events overnight.  Objective:   Vitals:   01/20/17 2149 01/21/17 0030 01/21/17 0248 01/21/17 0428  BP: (!) 109/43 110/77 (!) 120/50 124/65  Pulse: 78 78 78 78  Resp: (!) 24 (!) _0 Temp: 99.8 F (37.7 C) 98.8 F (37.1 C) 99 F (37.2 C) 98.3 F (36.8 C)  TempSrc: Oral Oral Oral Oral  SpO2: 96% 94% 100%   Weight:      Height:        Intake/Output Summary (Last 24 hours) at 01/21/17 1246 Last data filed at 01/21/17 0936  Gross per 24 hour  Intake              928 ml  Output             1000 ml  Net              -72 ml     Wt Readings from Last 3 Encounters:  01/18/17 63.9 kg (140 lb 14 oz)  01/02/17 64 kg (141 lb)  12/05/16 63.2 kg (139 lb 6.4 oz)     Exam  General: Alert and oriented 3, NAD    Eyes: PERRLA, EOMI  HEENT:  atraumatic, normocephalic, normal oropharynx   Cardiovascular:  S1 and S2 clear, regular rate and rhythm  Respiratory: Clear to auscultation bilaterally  Gastrointestinal:  soft nontender, nondistended, normal bowel sounds  Ext: no  pedal edema bilaterally  Neuro: No focal neurological deficits  Musculoskeletal : No digital cyanosis, clubbing.  right leg in the immobilizer   Skin: No  rashes  Psych alert and oriented 3, normal affect   Data Reviewed:  I have personally reviewed following labs and imaging studies  Micro Results Recent Results (from the past 240 hour(s))  Urine culture     Status: Abnormal   Collection Time: 01/18/17  3:24 PM  Result Value Ref Range Status   Specimen Description URINE, CLEAN CATCH  Final   Special Requests NONE  Final   Culture >=100,000 COLONIES/mL ESCHERICHIA COLI (A)  Final   Report Status 01/20/2017 FINAL  Final   Organism ID, Bacteria ESCHERICHIA COLI (A)  Final      Susceptibility   Escherichia coli - MIC*    AMPICILLIN 4 SENSITIVE Sensitive     CEFAZOLIN <=4 SENSITIVE Sensitive     CEFTRIAXONE <=1 SENSITIVE Sensitive     CIPROFLOXACIN <=0.25 SENSITIVE Sensitive     GENTAMICIN <=1 SENSITIVE Sensitive     IMIPENEM <=0.25 SENSITIVE Sensitive     NITROFURANTOIN <=16 SENSITIVE Sensitive     TRIMETH/SULFA <=20 SENSITIVE Sensitive     AMPICILLIN/SULBACTAM <=2 SENSITIVE Sensitive     PIP/TAZO <=4 SENSITIVE Sensitive     Extended ESBL NEGATIVE Sensitive     * >=100,000 COLONIES/mL ESCHERICHIA COLI  Surgical pcr screen     Status: None   Collection Time: 01/19/17 11:54 AM  Result Value Ref Range Status   MRSA, PCR NEGATIVE NEGATIVE Final   Staphylococcus aureus NEGATIVE NEGATIVE Final    Comment:        The Xpert SA Assay (FDA approved for NASAL specimens in patients over 51 years of age), is one component of a comprehensive surveillance program.  Test performance has been validated by [facility] for patients greater than or equal to 56 year old. It is not intended to diagnose infection nor to guide or monitor treatment.     Radiology Reports Pelvis Portable  Result Date: 01/19/2017 CLINICAL DATA:  Femur fracture. EXAM: PORTABLE PELVIS 1-2 VIEWS COMPARISON:   CT scan 01/18/2017 FINDINGS: One-view portable exam of the lower pelvis at 1755 hours shows the patient be status post right hip hemiarthroplasty. Bones diffusely demineralized. No evidence for immediate hardware complications. IMPRESSION: Status post right hip hemiarthroplasty. Electronically Signed   By: Misty Stanley M.D.   On: 01/19/2017 18:42   Ct Hip Right Wo Contrast  Result Date: 01/18/2017 CLINICAL DATA:  Chronic right hip pain in patient with history of multiple myeloma. No known injury. Initial encounter. EXAM: CT OF THE RIGHT HIP WITHOUT CONTRAST TECHNIQUE: Multidetector CT imaging of the right hip was performed according to the standard protocol. Multiplanar CT image reconstructions were also generated. COMPARISON:  Plain films right hip 01/02/2017. FINDINGS: Bones/Joint/Cartilage The patient has a fracture through the right femoral neck. The fracture is mainly basicervical. There is a large lytic lesion in the superior aspect of the lateral femoral head. Extensive bony loss is present at the site of the fracture consistent with a pathologic injury in this patient with a history of multiple myeloma. There may be an incomplete and nondisplaced fracture of the right inferior pubic ramus. No other fracture is identified. All imaged bones demonstrate findings consistent with diffuse myelomatous involvement with loss of medullary bone and extensive endosteal scalloping. Ligaments Suboptimally assessed by CT. Muscles and Tendons Intact. Soft tissues Some soft tissue edema about the right hip. Atherosclerotic calcifications are noted. IMPRESSION: Findings consistent with a pathologic fracture of the right femoral neck. A large lytic lesion in the superior aspect of the lateral femoral head is  identified. Possible nondisplaced fracture of the right inferior pubic ramus. Diffuse myeloma. Electronically Signed   By: Inge Rise M.D.   On: 01/18/2017 15:59   Dg Chest Port 1 View  Result Date:  01/18/2017 CLINICAL DATA:  patient from home, hx multiple myeloma. Daughter noticed light pink spotting in brief beginning last night. Acute/chronic right hip pain. Denies abdominal pain, N/V/D, chest pain and SOB. Hx hypertension - hx chronic kidney disease EXAM: PORTABLE CHEST - 1 VIEW COMPARISON:  10/22/2016 FINDINGS: Low lung volumes.  No focal infiltrate or overt edema. Heart size upper limits normal.  Tortuous atheromatous aorta. No effusion. Degenerative spurring in bilateral shoulders. Changes of previous mid thoracic vertebral augmentation. IMPRESSION: Low volumes.  No acute disease. Electronically Signed   By: Lucrezia Europe M.D.   On: 01/18/2017 20:19   Dg Hip Unilat W Or W/o Pelvis 2-3 Views Right  Result Date: 01/02/2017 CLINICAL DATA:  Right hip pain.  No injury.  Multiple myeloma . EXAM: DG HIP (WITH OR WITHOUT PELVIS) 2-3V RIGHT COMPARISON:  06/14/2016. FINDINGS: Degenerative changes lumbar spine and both hips. No evidence of fracture or dislocation. Multiple tiny lucencies are noted in the proximal femurs bilaterally. These changes may be related to multiple mild lumbar given the patient's history. Peripheral vascular calcification. Surgical clips right upper quadrant . IMPRESSION: 1. Degenerative changes lumbar spine and both hips. No evidence of fracture or dislocation. 2. Diffuse osteopenia. Multiple tiny lucencies are noted throughout both proximal femurs. These may be secondary to the patient's known multiple myeloma. Electronically Signed   By: Marcello Moores  Register   On: 01/02/2017 13:52    Lab Data:  CBC:  Recent Labs Lab 01/18/17 1803 01/19/17 0644 01/19/17 1718 01/20/17 0636 01/20/17 0830 01/21/17 0502  WBC 6.9 4.8 4.4 5.5  --  8.9  NEUTROABS 3.4  --   --   --   --   --   HGB 8.6* 8.1* 8.1* 6.4* 6.6* 9.5*  HCT 26.0* 25.0* 25.7* 20.1* 20.7* 29.2*  MCV 102.0* 103.3* 105.3* 103.6*  --  95.1  PLT 165 168 138* 148*  --  397   Basic Metabolic Panel:  Recent Labs Lab  01/18/17 1803 01/19/17 0644  01/19/17 1718 01/19/17 2131 01/20/17 0021 01/20/17 0524 01/21/17 0502  NA 138 135  --   --   --   --  138 138  K 5.4* 4.4  < > 4.3 4.3 3.9 3.8 3.8  CL 105 104  --   --   --   --  106 106  CO2 24 23  --   --   --   --  23 24  GLUCOSE 153* 106*  --   --   --   --  134* 98  BUN 37* 28*  --   --   --   --  15 16  CREATININE 1.11* 0.91  --  0.87  --   --  0.86 0.76  CALCIUM 7.2* 6.6*  --   --   --   --  6.4* 6.6*  < > = values in this interval not displayed. GFR: Estimated Creatinine Clearance: 40.2 mL/min (by C-G formula based on SCr of 0.76 mg/dL). Liver Function Tests: No results for input(s): AST, ALT, ALKPHOS, BILITOT, PROT, ALBUMIN in the last 168 hours. No results for input(s): LIPASE, AMYLASE in the last 168 hours. No results for input(s): AMMONIA in the last 168 hours. Coagulation Profile: No results for input(s): INR, PROTIME in the last  168 hours. Cardiac Enzymes: No results for input(s): CKTOTAL, CKMB, CKMBINDEX, TROPONINI in the last 168 hours. BNP (last 3 results) No results for input(s): PROBNP in the last 8760 hours. HbA1C: No results for input(s): HGBA1C in the last 72 hours. CBG:  Recent Labs Lab 01/20/17 2008 01/20/17 2355 01/21/17 0424 01/21/17 0828 01/21/17 1154  GLUCAP 121* 141* 86 168* 125*   Lipid Profile: No results for input(s): CHOL, HDL, LDLCALC, TRIG, CHOLHDL, LDLDIRECT in the last 72 hours. Thyroid Function Tests: No results for input(s): TSH, T4TOTAL, FREET4, T3FREE, THYROIDAB in the last 72 hours. Anemia Panel: No results for input(s): VITAMINB12, FOLATE, FERRITIN, TIBC, IRON, RETICCTPCT in the last 72 hours. Urine analysis:    Component Value Date/Time   COLORURINE YELLOW 01/18/2017 1524   APPEARANCEUR CLOUDY (A) 01/18/2017 1524   LABSPEC 1.012 01/18/2017 1524   PHURINE 5.0 01/18/2017 1524   GLUCOSEU NEGATIVE 01/18/2017 1524   HGBUR LARGE (A) 01/18/2017 1524   BILIRUBINUR NEGATIVE 01/18/2017 1524    KETONESUR NEGATIVE 01/18/2017 1524   PROTEINUR 30 (A) 01/18/2017 1524   UROBILINOGEN 0.2 10/18/2007 0945   NITRITE NEGATIVE 01/18/2017 1524   LEUKOCYTESUR LARGE (A) 01/18/2017 1524     Ripudeep Rai M.D. [facility] 01/21/2017, 12:46 PM  Pager: [phone] Between 7am to 7pm - call Pager - [phone]  After 7pm go to [email or web address] - password [email or web address]  Call night coverage person covering after 7pm

## 2017-01-21 NOTE — Progress Notes (Signed)
ANTICOAGULATION CONSULT NOTE - Initial Consult  Pharmacy Consult for Lovenox Indication: VTE prophylaxis  Assessment: 68 yoF admitted for a pathologic hip fracture and underwent R hip hemiarthroplasty on 6/7. Pharmacy consulted to dose Lovenox for VTE prophylaxis based on weight and renal function. Patient's weight is 63.9 kg, which is above the adjustment threshold of < 45 kg. Patient's renal function has improved with Scr 0.76 and CrCl ~42 ml/min, which is above the adjustment threshold of < 30 ml/min. Hemoglobin and platelets have improved (patient with chronic anemia secondary to multiple myeloma).   Enoxaparin 25m daily for VTE prophylaxis is appropriate at this time.   Goal of Therapy:  Monitor platelets by anticoagulation protocol: Yes   Plan:  Lovenox 40 mg Hoffman daily Monitor CBC as needed, s/sx's of bleeding   ABelia Heman PharmD PGY1 Pharmacy Resident [phone](Pager) 01/21/2017 8:30 AM

## 2017-01-21 NOTE — Evaluation (Signed)
Occupational Therapy Evaluation Patient Details Name: Kristin [name] MRN: [id number] DOB: [birth year] Today's Date: 01/21/2017    History of Present Illness Pt is an 83 y.o. female s/p R hip hemiarthroplasty. She suffered 2 weeks of worsening hip pain becoming severe and limiting ability to bear weight and x-ray revealed pathologic fracture to R femoral neck. Pt has been on comfort care measures for multiple myeloma but family would now like to pursue aggressive measures.  Pt has a PMH significant for multiple myeloma, arthritis, chronic kidney disease (stage III), depression, glaucoma, hiatal hernia, hypertension, obesity, and osteoporosis.    Clinical Impression   Prior to increasing hip pain, pt reports that she required assistance for dressing and bathing tasks but was able to ambulate without assistive devices (although she has a RW) for functional mobility. She currently requires max assist +2 for toilet transfers, LB ADL, and toilet hygiene. She additionally requires set-up for feeding and grooming tasks and min assist for UB ADL. Pt would benefit from continued OT services while admitted to improve independence with ADL and functional mobility. Feel pt would best benefit from SNF level rehabilitation post-acute D/C in order to maximize independence with ADL and functional mobility in preparation for return home.     Follow Up Recommendations  SNF;Supervision/Assistance - 24 hour    Equipment Recommendations  Other (comment) (TBD at next venue of care)    Recommendations for Other Services       Precautions / Restrictions Precautions Precautions: Posterior Hip Precaution Booklet Issued: No Precaution Comments: Reviewed posterior hip precautions verbally with pt Required Braces or Orthoses: Knee Immobilizer - Right Knee Immobilizer - Right: On at all times Restrictions Weight Bearing Restrictions: Yes RLE Weight Bearing: Weight bearing as tolerated      Mobility Bed  Mobility Overal bed mobility: Needs Assistance Bed Mobility: Supine to Sit     Supine to sit: Max assist;+2 for physical assistance     General bed mobility comments: Pt reporting fear of falling and some dizziness during bed mobility. Max assist +2 with pad to mobilize legs to EOB.   Transfers Overall transfer level: Needs assistance Equipment used: Rolling walker (2 wheeled) Transfers: Sit to/from [facility] Sit to Stand: Max assist;+2 physical assistance Stand pivot transfers: Max assist;+2 physical assistance       General transfer comment: Max assist to progress R LE during stand-pivot transfer.    Balance Overall balance assessment: Needs assistance Sitting-balance support: Bilateral upper extremity supported;Feet supported Sitting balance-Leahy Scale: Poor Sitting balance - Comments: Reliant on UE support or external assistance   Standing balance support: Bilateral upper extremity supported;During functional activity Standing balance-Leahy Scale: Poor Standing balance comment: Reliant on B UE support from RW and external assist.                            ADL either performed or assessed with clinical judgement   ADL Overall ADL's : Needs assistance/impaired Eating/Feeding: Set up;Sitting   Grooming: Set up;Sitting   Upper Body Bathing: Minimal assistance;Sitting   Lower Body Bathing: Maximal assistance;Sit to/from stand   Upper Body Dressing : Minimal assistance;Sitting   Lower Body Dressing: Maximal assistance;Sit to/from stand   Toilet Transfer: Maximal assistance;+2 for physical assistance;Stand-pivot;RW;BSC Toilet Transfer Details (indicate cue type and reason): Simulated from bed to chair.  Toileting- Clothing Manipulation and Hygiene: Maximal assistance;Sit to/from stand       Functional mobility during ADLs: Maximal assistance;+2 for physical assistance (  stand-pivot) General ADL Comments: Pt educated on posterior hip  precautions during ADL. She reports fear of falling once seated at EOB.      Vision Patient Visual Report: No change from baseline Vision Assessment?: No apparent visual deficits     Perception     Praxis      Pertinent Vitals/Pain Pain Assessment: Faces Faces Pain Scale: Hurts even more Pain Location: R hip Pain Descriptors / Indicators: Operative site guarding;Sore Pain Intervention(s): Limited activity within patient's tolerance;Monitored during session;Repositioned     Hand Dominance  ("both")   Extremity/Trunk Assessment Upper Extremity Assessment Upper Extremity Assessment: Generalized weakness   Lower Extremity Assessment Lower Extremity Assessment: RLE deficits/detail RLE Deficits / Details: Decreased strength and ROM as expected post-operatively.        Communication Communication Communication: No difficulties   Cognition Arousal/Alertness: Awake/alert Behavior During Therapy: WFL for tasks assessed/performed Overall Cognitive Status: Within Functional Limits for tasks assessed                                     General Comments       Exercises     Shoulder Instructions      Home Living Family/patient expects to be discharged to:: Skilled nursing facility Living Arrangements:  (per pt alone, per chart with children)                                      Prior Functioning/Environment Level of Independence: Needs assistance  Gait / Transfers Assistance Needed: Prior to increase in hip pain, pt reports that she has a RW but does not use this. ADL's / Homemaking Assistance Needed: Reports she requires assistance for dressing, bathing, and IADL tasks.    Comments: Pt reports that prior to increasing hip pain (2 weeks ago) she has been able to ambulate without a RW.        OT Problem List: Decreased strength;Decreased range of motion;Decreased activity tolerance;Impaired balance (sitting and/or standing);Decreased safety  awareness;Decreased knowledge of use of DME or AE;Decreased knowledge of precautions;Pain      OT Treatment/Interventions: Self-care/ADL training;Therapeutic exercise;Energy conservation;DME and/or AE instruction;Therapeutic activities;Patient/family education;Balance training    OT Goals(Current goals can be found in the care plan section) Acute Rehab OT Goals Patient Stated Goal: not to fall OT Goal Formulation: With patient Time For Goal Achievement: 02/04/17 Potential to Achieve Goals: Good ADL Goals Pt Will Perform Grooming: with min guard assist;standing Pt Will Perform Lower Body Bathing: with min assist;with adaptive equipment;sit to/from stand Pt Will Perform Lower Body Dressing: with min assist;with adaptive equipment;sit to/from stand Pt Will Transfer to Toilet: with min assist;ambulating;bedside commode (BSC over toilet) Additional ADL Goal #1: Pt will independently generalize 3/3 posterior hip precautions into morning ADL routine.   OT Frequency: Min 2X/week   Barriers to D/C:            Co-evaluation              AM-PAC PT "6 Clicks" Daily Activity     Outcome Measure Help from another person eating meals?: A Little Help from another person taking care of personal grooming?: A Little Help from another person toileting, which includes using toliet, bedpan, or urinal?: A Lot Help from another person bathing (including washing, rinsing, drying)?: A Lot Help from another person to put on and taking  off regular upper body clothing?: A Little Help from another person to put on and taking off regular lower body clothing?: A Lot 6 Click Score: 15   End of Session Equipment Utilized During Treatment: Gait belt;Rolling walker Nurse Communication: Mobility status (transfer methods)  Activity Tolerance: Patient tolerated treatment well Patient left: in chair;with call bell/phone within reach;with bed alarm set  OT Visit Diagnosis: Other abnormalities of gait and  mobility (R26.89);Pain;Muscle weakness (generalized) (M62.81) Pain - Right/Left: Right Pain - part of body: Hip                Time: 8309-4076 OT Time Calculation (min): 30 min Charges:  OT General Charges $OT Visit: 1 Procedure OT Evaluation $OT Eval Moderate Complexity: 1 Procedure G-Codes:     Norman Herrlich, MS OTR/L  Pager: [place] A Araly Kaas 01/21/2017, 3:15 PM

## 2017-01-21 NOTE — Progress Notes (Signed)
1415--[facility] - [facility] RN visit  This is a related and covered GIP hospital admission of 01/18/17 with [facility] DX of Multiple Myeloma per Dr. Antonieta Loveless. Patient was admitted from home after complaints of R hip pain worsening over the past 2 weeks where Xrays show a pathologic fracture of the right femoral neck. [facility] was not notified prior to patient arriving in the ED for evaluation of vaginal bleeding.  Pt changed code status 01/19/17 from DNR to Full Code.  Pt underwent R hip hemiarthroplasty 01/19/17.   Visited with patient and daughter Hassan Rowan in room, pt sitting up in chair. States she is having pain 5/10 but does not want anything to help with pain, "it is tolerable". Pt states she is eating "okay", she states she needs time and God's hand. We discussed that her doctor is also adding a medication to stimulate her appetite.  Pt received 2 units PRBC 01/20/17, Hgb 9.5.  Pt is receiving Rocephin 1 Gm every 24 hours, [name] 30 mg scheduled every 12 hours. She has had Oxycodone 10 mg X 1 today at 1046 and Hydrocodone-acetaminophen 5-325 2 tablets x 1 yesterday 1432.  Per review of notes Dr. Burr Medico did consult with patient 01/20/17 and will plan to restart dexamethasone and Revlimid in 3-4 weeks. Plan at this time is still to discharge to SNF for rehab when time appropriate. Hospice benefit will be revoked to pursue PT and oncology treament.  [facility] will continue to follow and anticipate any discharge needs.  Please feel free to call with any hospice related questions or concerns.  Thank you, Margaretmary Eddy, [facility] Liaison [phone]

## 2017-01-21 NOTE — Progress Notes (Signed)
Nutrition Consult/Brief Note  RD consulted for assessment of nutrition requirements/status.  Initial nutrition assessment completed 01/20/17.  Ensure Enlive ordered TID.  No further interventions at this time.  Arthur Holms, RD, LDN Pager #: [phone] After-Hours Pager #: [phone]

## 2017-01-21 NOTE — Progress Notes (Signed)
Foley removed per protocol.

## 2017-01-21 NOTE — Progress Notes (Signed)
Subjective: 2 Days Post-Op Procedure(s) (LRB): RIGHT HIP HEMIARTHROPLASTY (Right) Patient reports pain as mild.    Objective: Vital signs in last 24 hours: Temp:  [98.3 F (36.8 C)-100 F (37.8 C)] 98.3 F (36.8 C) (06/09 0428) Pulse Rate:  [77-81] 78 (06/09 0428) Resp:  [16-24] 18 (06/09 0428) BP: (109-130)/(43-77) 124/65 (06/09 0428) SpO2:  [94 %-100 %] 100 % (06/09 0248)  Intake/Output from previous day: 06/08 0701 - 06/09 0700 In: 808 [P.O.:120; Blood:638; IV Piggyback:50] Out: 700 [Urine:700] Intake/Output this shift: Total I/O In: 120 [P.O.:120] Out: 550 [Urine:300; Stool:250]   Recent Labs  01/19/17 0644 01/19/17 1718 01/20/17 0636 01/20/17 0830 01/21/17 0502  HGB 8.1* 8.1* 6.4* 6.6* 9.5*    Recent Labs  01/20/17 0636 01/20/17 0830 01/21/17 0502  WBC 5.5  --  8.9  RBC 1.94*  --  3.07*  HCT 20.1* 20.7* 29.2*  PLT 148*  --  176    Recent Labs  01/20/17 0524 01/21/17 0502  NA 138 138  K 3.8 3.8  CL 106 106  CO2 23 24  BUN 15 16  CREATININE 0.86 0.76  GLUCOSE 134* 98  CALCIUM 6.4* 6.6*   No results for input(s): LABPT, INR in the last 72 hours.  ABD soft Neurovascular intact Sensation intact distally Intact pulses distally Dorsiflexion/Plantar flexion intact Incision: dressing C/D/I  Assessment/Plan: 2 Days Post-Op Procedure(s) (LRB): RIGHT HIP HEMIARTHROPLASTY (Right)  Principal Problem:   Hip fracture, pathological (HCC) Active Problems:   HTN (hypertension)   Anemia in neoplastic disease   Multiple myeloma (HCC)   CKD (chronic kidney disease), stage III   Acute lower UTI   Pathologic hip fracture ([place])   Hyperkalemia   Palliative care by specialist  Advance diet Up with therapy  Jaun Galluzzo J 01/21/2017, 3:38 PM

## 2017-01-21 NOTE — Evaluation (Signed)
Physical Therapy Evaluation Patient Details Name: Kristin [name] MRN: [id number] DOB: 1930-10-17 Today's Date: 01/21/2017   History of Present Illness  Pt is an 83 y.o. female s/p R hip hemiarthroplasty. She suffered 2 weeks of worsening hip pain becoming severe and limiting ability to bear weight and x-ray revealed pathologic fracture to R femoral neck. Pt has been on comfort care measures for multiple myeloma but family would now like to pursue aggressive measures.  Pt has a PMH significant for multiple myeloma, arthritis, chronic kidney disease (stage III), depression, glaucoma, hiatal hernia, hypertension, obesity, and osteoporosis.   Clinical Impression  Pt presents with the above diagnosis and below deficits for therapy evaluation. Prior to admission, pt reports living alone but per chart lived with her daughter. Pt had been bed-bound x 2 weeks prior to admission due to pathological fracture. Pt requires Max +2 assistance this session to perform bed mobility and transfers due to pain and fear of mobilizing. Pt will benefit from continued acute rehab services in order to address the below deficits prior to DC to venue recommended below.     Follow Up Recommendations SNF;Supervision for mobility/OOB    Equipment Recommendations  None recommended by PT    Recommendations for Other Services       Precautions / Restrictions Precautions Precautions: Posterior Hip Precaution Booklet Issued: No Precaution Comments: Reviewed posterior hip precautions verbally with pt Required Braces or Orthoses: Knee Immobilizer - Right Knee Immobilizer - Right: On at all times Restrictions Weight Bearing Restrictions: Yes RLE Weight Bearing: Weight bearing as tolerated      Mobility  Bed Mobility Overal bed mobility: Needs Assistance Bed Mobility: Supine to Sit     Supine to sit: Max assist;+2 for physical assistance     General bed mobility comments: Pt reporting fear of falling and some  dizziness during bed mobility. Max assist +2 with pad to mobilize legs to EOB.   Transfers Overall transfer level: Needs assistance Equipment used: Rolling walker (2 wheeled) Transfers: Sit to/from [facility] Sit to Stand: Max assist;+2 physical assistance Stand pivot transfers: Max assist;+2 physical assistance       General transfer comment: Max assist to progress R LE during stand-pivot transfer with use of RW.   Ambulation/Gait                Stairs            Wheelchair Mobility    Modified Rankin (Stroke Patients Only)       Balance Overall balance assessment: Needs assistance Sitting-balance support: Bilateral upper extremity supported;Feet supported Sitting balance-Leahy Scale: Poor Sitting balance - Comments: Reliant on UE support or external assistance   Standing balance support: Bilateral upper extremity supported;During functional activity Standing balance-Leahy Scale: Poor Standing balance comment: Reliant on B UE support from RW and external assist.                              Pertinent Vitals/Pain Pain Assessment: Faces Faces Pain Scale: Hurts even more Pain Location: R hip Pain Descriptors / Indicators: Operative site guarding;Sore Pain Intervention(s): Limited activity within patient's tolerance;Monitored during session;Repositioned    Home Living Family/patient expects to be discharged to:: Skilled nursing facility Living Arrangements: Other (Comment) (Per pt she lived alone, per chart with children)                    Prior Function Level of Independence: Needs assistance  Gait / Transfers Assistance Needed: Prior to increase in hip pain, pt reports that she has a RW but does not use this.  ADL's / Homemaking Assistance Needed: Reports she requires assistance for dressing, bathing, and IADL tasks.   Comments: Pt reports that prior to increasing hip pain (2 weeks ago) she has been able to ambulate  without a RW.     Hand Dominance   Dominant Hand:  (ambidextrous)    Extremity/Trunk Assessment   Upper Extremity Assessment Upper Extremity Assessment: Defer to OT evaluation    Lower Extremity Assessment Lower Extremity Assessment: RLE deficits/detail;LLE deficits/detail RLE Deficits / Details: Pt is post op pain and weakness. At least 2/5 ankle knee and hip grossly.  RLE: Unable to fully assess due to pain LLE Deficits / Details: Pt with at least 3/5 grossly LLE       Communication   Communication: No difficulties  Cognition Arousal/Alertness: Awake/alert Behavior During Therapy: WFL for tasks assessed/performed Overall Cognitive Status: No family/caregiver present to determine baseline cognitive functioning                                        General Comments      Exercises     Assessment/Plan    PT Assessment Patient needs continued PT services  PT Problem List Decreased strength;Decreased range of motion;Decreased activity tolerance;Decreased balance;Decreased mobility;Decreased knowledge of use of DME;Pain       PT Treatment Interventions DME instruction;Gait training;Functional mobility training;Therapeutic activities;Therapeutic exercise;Balance training    PT Goals (Current goals can be found in the Care Plan section)  Acute Rehab PT Goals Patient Stated Goal: not to fall PT Goal Formulation: With patient Time For Goal Achievement: 01/28/17 Potential to Achieve Goals: Fair    Frequency Min 2X/week   Barriers to discharge        Co-evaluation PT/OT/SLP Co-Evaluation/Treatment: Yes Reason for Co-Treatment: Complexity of the patient's impairments (multi-system involvement);To address functional/ADL transfers;For patient/therapist safety PT goals addressed during session: Mobility/safety with mobility         AM-PAC PT "6 Clicks" Daily Activity  Outcome Measure Difficulty turning over in bed (including adjusting bedclothes,  sheets and blankets)?: Total Difficulty moving from lying on back to sitting on the side of the bed? : Total Difficulty sitting down on and standing up from a chair with arms (e.g., wheelchair, bedside commode, etc,.)?: Total Help needed moving to and from a bed to chair (including a wheelchair)?: Total Help needed walking in hospital room?: Total Help needed climbing 3-5 steps with a railing? : Total 6 Click Score: 6    End of Session Equipment Utilized During Treatment: Gait belt Activity Tolerance: Patient tolerated treatment well Patient left: in chair;with call bell/phone within reach;with chair alarm set Nurse Communication: Mobility status;Precautions PT Visit Diagnosis: Unsteadiness on feet (R26.81);Muscle weakness (generalized) (M62.81);Difficulty in walking, not elsewhere classified (R26.2);Pain Pain - Right/Left: Right Pain - part of body: Hip    Time: 0300-9233 PT Time Calculation (min) (ACUTE ONLY): 31 min   Charges:   PT Evaluation $PT Eval Moderate Complexity: 1 Procedure     PT G Codes:        Scheryl Marten PT, DPT  [phone]   Shanon Rosser 01/21/2017, 3:23 PM

## 2017-01-22 LAB — BASIC METABOLIC PANEL
Anion gap: 7 (ref 5–15)
BUN: 17 mg/dL (ref 6–20)
CALCIUM: 6.4 mg/dL — AB (ref 8.9–10.3)
CO2: 24 mmol/L (ref 22–32)
CREATININE: 0.89 mg/dL (ref 0.44–1.00)
Chloride: 106 mmol/L (ref 101–111)
GFR calc non Af Amer: 58 mL/min — ABNORMAL LOW (ref >60)
GLUCOSE: 117 mg/dL — AB (ref 65–99)
Potassium: 3.8 mmol/L (ref 3.5–5.1)
Sodium: 137 mmol/L (ref 135–145)

## 2017-01-22 LAB — HEPATIC FUNCTION PANEL
ALBUMIN: 2.1 g/dL — AB (ref 3.5–5.0)
ALK PHOS: 80 U/L (ref 38–126)
ALT: 15 U/L (ref 14–54)
AST: 24 U/L (ref 15–41)
BILIRUBIN DIRECT: 0.3 mg/dL (ref 0.1–0.5)
BILIRUBIN TOTAL: 0.9 mg/dL (ref 0.3–1.2)
Indirect Bilirubin: 0.6 mg/dL (ref 0.3–0.9)
Total Protein: 4.9 g/dL — ABNORMAL LOW (ref 6.5–8.1)

## 2017-01-22 LAB — CBC
HEMATOCRIT: 27.8 % — AB (ref 36.0–46.0)
Hemoglobin: 9.1 g/dL — ABNORMAL LOW (ref 12.0–15.0)
MCH: 31.7 pg (ref 26.0–34.0)
MCHC: 32.7 g/dL (ref 30.0–36.0)
MCV: 96.9 fL (ref 78.0–100.0)
Platelets: 216 10*3/uL (ref 150–400)
RBC: 2.87 MIL/uL — ABNORMAL LOW (ref 3.87–5.11)
RDW: 19.8 % — AB (ref 11.5–15.5)
WBC: 8.3 10*3/uL (ref 4.0–10.5)

## 2017-01-22 LAB — GLUCOSE, CAPILLARY
Glucose-Capillary: 102 mg/dL — ABNORMAL HIGH (ref 65–99)
Glucose-Capillary: 105 mg/dL — ABNORMAL HIGH (ref 65–99)
Glucose-Capillary: 113 mg/dL — ABNORMAL HIGH (ref 65–99)
Glucose-Capillary: 121 mg/dL — ABNORMAL HIGH (ref 65–99)
Glucose-Capillary: 126 mg/dL — ABNORMAL HIGH (ref 65–99)
Glucose-Capillary: 149 mg/dL — ABNORMAL HIGH (ref 65–99)

## 2017-01-22 LAB — MAGNESIUM: Magnesium: 2.3 mg/dL (ref 1.7–2.4)

## 2017-01-22 MED ORDER — SODIUM CHLORIDE 0.9 % IV SOLN
1.0000 g | Freq: Once | INTRAVENOUS | Status: AC
  Administered 2017-01-22: 1 g via INTRAVENOUS
  Filled 2017-01-22: qty 10

## 2017-01-22 NOTE — Progress Notes (Signed)
[facility]                                                                              Patient Demographics  Kristin [name], is a 83 y.o. female, DOB - Sep 02, 1930, OBS:[id number]  Admit date - 01/18/2017   Admitting Physician [name], MD  Outpatient Primary MD for the patient is [name], MD  Outpatient specialists:   LOS - 4  days   Medical records reviewed and are as summarized below:    Chief Complaint  Patient presents with  . Vaginal Bleeding       Brief summary   Patient is a 83 year old female with hypertension, CKD stage III, depression, multiple myeloma receiving palliative treatments presented to the ED after severe right hip pain. Patient reported being bedbound for past 2 weeks due to severe pain in the right hip and prior to this she was able to walk with a walker. Patient's daughter also noted some pink spots that appear to be blood on her underwear last night. CT of the hip showed pathology to fracture in the right femoral neck with large lytic lesion at the superior aspect of the lateral femoral head and diffuse myeloma. UA was positive for UTI and grossly bloody.   Assessment & Plan    Principal Problem:  Pathologic right hip fracture Secondary to multiple myeloma -Patient presented with severe right hip pain, found to have pathological right hip fracture with a large lytic lesion on the CT scan.  - Orthopedics was consulted and patient underwent right hip hemiarthroplasty on 6/7, postop day # 3 - Transfused 2 units packed RBCs on 6/8, H&H stable, 9.1 - Appreciate hematology recommendations, placed on Lovenox for DVT prophylaxis and also added megace for appetite stimulant - Patient working with physical therapy, recommended skilled nursing facility  Constipation - Resolved, continue bowel regimen    Multiple myeloma  -  follows oncology, Dr. Burr Medico palliative treatments  - She was treated with lenalidomide, currently on  hold.     Hyperkalemia  - Serum potassium 5.4 on admission without EKG changes , now resolved - Likely secondary to renal insufficiency and potassium currently stable     acute on chronic blood loss anemia secondary to surgery, chronic Macrocytic anemia -  transfused 2 units packed RBCs on 6/8, hemoglobin now stable, 9.1 today - Patient has chronic anemia secondary to multiple myeloma  Escherichia coli UTI  Urine cultures consistent with more than 100,000 colonies of Escherichia coli, pansensitive - Continue IV Rocephin  Hypertension  -  BP now stable   CKD stage III  - SCr is 1.11 on admission,  - Creatinine 0.8 today   Hypocalcemia - Calcium 6.4 will check albumin, magnesium -  calcium gluconate 1 g given today  Nutrition - Advance diet to regular diet, DC IV fluid hydration, creatinine has improved   Code Status: Full CODE STATUS DVT Prophylaxis:   SCD's Family Communication: Discussed in detail with the patient, all imaging results, lab results explained to the patient.   Disposition Plan:  Per plan, DC to skilled nursing facility once bed available, likely tomorrow Time Spent  in minutes 25 minutes  Procedures:  RIGHT HIP HEMIARTHROPLASTY (Right)  Consultants:   Orthopedics Palliative medicine  Antimicrobials :   IV Rocephin 6/6    Medications  Scheduled Meds: . cycloSPORINE  1 drop Both Eyes Daily  . enoxaparin (LOVENOX) injection  40 mg Subcutaneous Q24H  . feeding supplement (ENSURE ENLIVE)  237 mL Oral TID BM  . insulin aspart  0-9 Units Subcutaneous Q4H  . megestrol  40 mg Oral Daily  . mirtazapine  7.5 mg Oral QHS  . morphine  30 mg Oral Q12H  . polyethylene glycol  17 g Oral BID  . risperiDONE  0.5 mg Oral BID  . senna-docusate  2 tablet Oral BID   Continuous Infusions: . cefTRIAXone (ROCEPHIN)  IV Stopped (01/21/17 2157)   PRN Meds:.acetaminophen, HYDROcodone-acetaminophen, meclizine, morphine injection, oxyCODONE, sodium  phosphate   Antibiotics   Anti-infectives    Start     Dose/Rate Route Frequency Ordered Stop   01/20/17 0600  ceFAZolin (ANCEF) IVPB 2g/100 mL premix     2 g 200 mL/hr over 30 Minutes Intravenous On call to O.R. 01/19/17 1348 01/19/17 1535   01/19/17 1347  ceFAZolin (ANCEF) 2-4 GM/100ML-% IVPB    Comments:  Gallman, Kathie   : cabinet override      01/19/17 1347 01/19/17 1505   01/18/17 2200  cefTRIAXone (ROCEPHIN) 1 g in dextrose 5 % 50 mL IVPB     1 g 100 mL/hr over 30 Minutes Intravenous Every 24 hours 01/18/17 2049     01/18/17 1730  cephALEXin (KEFLEX) capsule 500 mg     500 mg Oral  Once 01/18/17 1717 01/18/17 1751        Subjective:   Kristin [name] was seen and examined today.Feeling somewhat better today, constipation resolved had a bowel movement yesterday. Right hip pain is better today .  Patient denies dizziness, chest pain, shortness of breath, abdominal pain, No acute events overnight.  Objective:   Vitals:   01/21/17 1601 01/21/17 2014 01/22/17 0013 01/22/17 0424  BP: 137/61 (!) 116/58 (!) 116/56 119/67  Pulse: (!) 111 83 93 92  Resp: 14 20 18 18   Temp: 98 F (36.7 C) 98.6 F (37 C) 98.4 F (36.9 C) 99.1 F (37.3 C)  TempSrc: Oral     SpO2: 96% 100% 100% 100%  Weight:      Height:        Intake/Output Summary (Last 24 hours) at 01/22/17 1054 Last data filed at 01/21/17 1724  Gross per 24 hour  Intake              240 ml  Output                0 ml  Net              240 ml     Wt Readings from Last 3 Encounters:  01/18/17 63.9 kg (140 lb 14 oz)  01/02/17 64 kg (141 lb)  12/05/16 63.2 kg (139 lb 6.4 oz)     Exam  General: Alert and oriented 3, NAD    Eyes: PERRLA, EOMI  HEENT:  Normocephalic atraumatic.   Cardiovascular:  S1, S2 clear, RRR    Respiratory:  CTA B  Gastrointestinal:   soft, NT, ND, NBS   Ext: no pedal edema bilaterally  Neuro: No new focal neurological deficits  Musculoskeletal : No  cyanosis, clubbing    Skin: No rashes  Psych alert and oriented 3, normal affect  Data Reviewed:  I have personally reviewed following labs and imaging studies  Micro Results Recent Results (from the past 240 hour(s))  Urine culture     Status: Abnormal   Collection Time: 01/18/17  3:24 PM  Result Value Ref Range Status   Specimen Description URINE, CLEAN CATCH  Final   Special Requests NONE  Final   Culture >=100,000 COLONIES/mL ESCHERICHIA COLI (A)  Final   Report Status 01/20/2017 FINAL  Final   Organism ID, Bacteria ESCHERICHIA COLI (A)  Final      Susceptibility   Escherichia coli - MIC*    AMPICILLIN 4 SENSITIVE Sensitive     CEFAZOLIN <=4 SENSITIVE Sensitive     CEFTRIAXONE <=1 SENSITIVE Sensitive     CIPROFLOXACIN <=0.25 SENSITIVE Sensitive     GENTAMICIN <=1 SENSITIVE Sensitive     IMIPENEM <=0.25 SENSITIVE Sensitive     NITROFURANTOIN <=16 SENSITIVE Sensitive     TRIMETH/SULFA <=20 SENSITIVE Sensitive     AMPICILLIN/SULBACTAM <=2 SENSITIVE Sensitive     PIP/TAZO <=4 SENSITIVE Sensitive     Extended ESBL NEGATIVE Sensitive     * >=100,000 COLONIES/mL ESCHERICHIA COLI  Surgical pcr screen     Status: None   Collection Time: 01/19/17 11:54 AM  Result Value Ref Range Status   MRSA, PCR NEGATIVE NEGATIVE Final   Staphylococcus aureus NEGATIVE NEGATIVE Final    Comment:        The Xpert SA Assay (FDA approved for NASAL specimens in patients over 6 years of age), is one component of a comprehensive surveillance program.  Test performance has been validated by [facility] for patients greater than or equal to 85 year old. It is not intended to diagnose infection nor to guide or monitor treatment.     Radiology Reports Pelvis Portable  Result Date: 01/19/2017 CLINICAL DATA:  Femur fracture. EXAM: PORTABLE PELVIS 1-2 VIEWS COMPARISON:  CT scan 01/18/2017 FINDINGS: One-view portable exam of the lower pelvis at 1755 hours shows the patient be status post right hip  hemiarthroplasty. Bones diffusely demineralized. No evidence for immediate hardware complications. IMPRESSION: Status post right hip hemiarthroplasty. Electronically Signed   By: Misty Stanley M.D.   On: 01/19/2017 18:42   Ct Hip Right Wo Contrast  Result Date: 01/18/2017 CLINICAL DATA:  Chronic right hip pain in patient with history of multiple myeloma. No known injury. Initial encounter. EXAM: CT OF THE RIGHT HIP WITHOUT CONTRAST TECHNIQUE: Multidetector CT imaging of the right hip was performed according to the standard protocol. Multiplanar CT image reconstructions were also generated. COMPARISON:  Plain films right hip 01/02/2017. FINDINGS: Bones/Joint/Cartilage The patient has a fracture through the right femoral neck. The fracture is mainly basicervical. There is a large lytic lesion in the superior aspect of the lateral femoral head. Extensive bony loss is present at the site of the fracture consistent with a pathologic injury in this patient with a history of multiple myeloma. There may be an incomplete and nondisplaced fracture of the right inferior pubic ramus. No other fracture is identified. All imaged bones demonstrate findings consistent with diffuse myelomatous involvement with loss of medullary bone and extensive endosteal scalloping. Ligaments Suboptimally assessed by CT. Muscles and Tendons Intact. Soft tissues Some soft tissue edema about the right hip. Atherosclerotic calcifications are noted. IMPRESSION: Findings consistent with a pathologic fracture of the right femoral neck. A large lytic lesion in the superior aspect of the lateral femoral head is identified. Possible nondisplaced fracture of the right inferior pubic ramus. Diffuse  myeloma. Electronically Signed   By: Inge Rise M.D.   On: 01/18/2017 15:59   Dg Chest Port 1 View  Result Date: 01/18/2017 CLINICAL DATA:  patient from home, hx multiple myeloma. Daughter noticed light pink spotting in brief beginning last night.  Acute/chronic right hip pain. Denies abdominal pain, N/V/D, chest pain and SOB. Hx hypertension - hx chronic kidney disease EXAM: PORTABLE CHEST - 1 VIEW COMPARISON:  10/22/2016 FINDINGS: Low lung volumes.  No focal infiltrate or overt edema. Heart size upper limits normal.  Tortuous atheromatous aorta. No effusion. Degenerative spurring in bilateral shoulders. Changes of previous mid thoracic vertebral augmentation. IMPRESSION: Low volumes.  No acute disease. Electronically Signed   By: Lucrezia Europe M.D.   On: 01/18/2017 20:19   Dg Hip Unilat W Or W/o Pelvis 2-3 Views Right  Result Date: 01/02/2017 CLINICAL DATA:  Right hip pain.  No injury.  Multiple myeloma . EXAM: DG HIP (WITH OR WITHOUT PELVIS) 2-3V RIGHT COMPARISON:  06/14/2016. FINDINGS: Degenerative changes lumbar spine and both hips. No evidence of fracture or dislocation. Multiple tiny lucencies are noted in the proximal femurs bilaterally. These changes may be related to multiple mild lumbar given the patient's history. Peripheral vascular calcification. Surgical clips right upper quadrant . IMPRESSION: 1. Degenerative changes lumbar spine and both hips. No evidence of fracture or dislocation. 2. Diffuse osteopenia. Multiple tiny lucencies are noted throughout both proximal femurs. These may be secondary to the patient's known multiple myeloma. Electronically Signed   By: Marcello Moores  Register   On: 01/02/2017 13:52    Lab Data:  CBC:  Recent Labs Lab 01/18/17 1803 01/19/17 2951 01/19/17 1718 01/20/17 0636 01/20/17 0830 01/21/17 0502 01/22/17 0513  WBC 6.9 4.8 4.4 5.5  --  8.9 8.3  NEUTROABS 3.4  --   --   --   --   --   --   HGB 8.6* 8.1* 8.1* 6.4* 6.6* 9.5* 9.1*  HCT 26.0* 25.0* 25.7* 20.1* 20.7* 29.2* 27.8*  MCV 102.0* 103.3* 105.3* 103.6*  --  95.1 96.9  PLT 165 168 138* 148*  --  176 884   Basic Metabolic Panel:  Recent Labs Lab 01/18/17 1803 01/19/17 0644  01/19/17 1718 01/19/17 2131 01/20/17 0021 01/20/17 0524  01/21/17 0502 01/22/17 0513  NA 138 135  --   --   --   --  138 138 137  K 5.4* 4.4  < > 4.3 4.3 3.9 3.8 3.8 3.8  CL 105 104  --   --   --   --  106 106 106  CO2 24 23  --   --   --   --  23 24 24   GLUCOSE 153* 106*  --   --   --   --  134* 98 117*  BUN 37* 28*  --   --   --   --  15 16 17   CREATININE 1.11* 0.91  --  0.87  --   --  0.86 0.76 0.89  CALCIUM 7.2* 6.6*  --   --   --   --  6.4* 6.6* 6.4*  < > = values in this interval not displayed. GFR: Estimated Creatinine Clearance: 36.2 mL/min (by C-G formula based on SCr of 0.89 mg/dL). Liver Function Tests: No results for input(s): AST, ALT, ALKPHOS, BILITOT, PROT, ALBUMIN in the last 168 hours. No results for input(s): LIPASE, AMYLASE in the last 168 hours. No results for input(s): AMMONIA in the last 168 hours. Coagulation Profile:  No results for input(s): INR, PROTIME in the last 168 hours. Cardiac Enzymes: No results for input(s): CKTOTAL, CKMB, CKMBINDEX, TROPONINI in the last 168 hours. BNP (last 3 results) No results for input(s): PROBNP in the last 8760 hours. HbA1C: No results for input(s): HGBA1C in the last 72 hours. CBG:  Recent Labs Lab 01/21/17 1723 01/21/17 2010 01/22/17 0010 01/22/17 0422 01/22/17 0846  GLUCAP 108* 119* 126* 121* 102*   Lipid Profile: No results for input(s): CHOL, HDL, LDLCALC, TRIG, CHOLHDL, LDLDIRECT in the last 72 hours. Thyroid Function Tests: No results for input(s): TSH, T4TOTAL, FREET4, T3FREE, THYROIDAB in the last 72 hours. Anemia Panel: No results for input(s): VITAMINB12, FOLATE, FERRITIN, TIBC, IRON, RETICCTPCT in the last 72 hours. Urine analysis:    Component Value Date/Time   COLORURINE YELLOW 01/18/2017 1524   APPEARANCEUR CLOUDY (A) 01/18/2017 1524   LABSPEC 1.012 01/18/2017 1524   PHURINE 5.0 01/18/2017 1524   GLUCOSEU NEGATIVE 01/18/2017 1524   HGBUR LARGE (A) 01/18/2017 1524   BILIRUBINUR NEGATIVE 01/18/2017 1524   KETONESUR NEGATIVE 01/18/2017 1524    PROTEINUR 30 (A) 01/18/2017 1524   UROBILINOGEN 0.2 10/18/2007 0945   NITRITE NEGATIVE 01/18/2017 1524   LEUKOCYTESUR LARGE (A) 01/18/2017 1524     Brenna Friesenhahn M.D. [facility] 01/22/2017, 10:54 AM  Pager: [phone] Between 7am to 7pm - call Pager - [phone]  After 7pm go to [email or web address] - password [email or web address]  Call night coverage person covering after 7pm

## 2017-01-22 NOTE — Plan of Care (Signed)
Problem: Pain Managment: Goal: General experience of comfort will improve Patient encouraged to voice pain levels as its our goal to manage pain well/patient agreeable

## 2017-01-22 NOTE — Progress Notes (Signed)
1345--[facility] [place] - GIP RN visit  This is a related and covered GIP hospital admission of 01/18/17 with [facility] DX of Multiple Myeloma per Dr. Antonieta Loveless. Patient was admitted from home after complaints of R hip pain worsening over the past 2 weeks where Xrays show a pathologic fracture of the right femoral neck. [facility] was not notified prior to patient arriving in the ED for evaluation of vaginal bleeding.  Pt changed code status 01/19/17 from DNR to Full Code.  Pt underwent R hip hemiarthroplasty 01/19/17.   Visited with patient in room, pt sitting in bed. States she is having pain 7/10 and has asked nurse for something for pain. Pt states she ambulated in the hallway yesterday. Pt states she is eating "okay", "just going to take time.   Pt is receiving Rocephin 1 Gm every 24 hours, [name] 30 mg scheduled every 12 hours. She has had Hydrocodone-acetaminophen 5-325 1 tablet x 2 today.  Per review of notes likely plan for discharge to SNF tomorrow if bed available.  [facility] will continue to follow and anticipate any discharge needs.  Please feel free to call with any hospice related questions or concerns.  Margaretmary Eddy, RN [facility] Liaison [phone]  [place] are on [place].

## 2017-01-23 DIAGNOSIS — C9000 Multiple myeloma not having achieved remission: Secondary | ICD-10-CM

## 2017-01-23 DIAGNOSIS — M84551A Pathological fracture in neoplastic disease, right femur, initial encounter for fracture: Principal | ICD-10-CM

## 2017-01-23 DIAGNOSIS — Z515 Encounter for palliative care: Secondary | ICD-10-CM

## 2017-01-23 DIAGNOSIS — Z7189 Other specified counseling: Secondary | ICD-10-CM

## 2017-01-23 LAB — GLUCOSE, CAPILLARY
GLUCOSE-CAPILLARY: 110 mg/dL — AB (ref 65–99)
GLUCOSE-CAPILLARY: 113 mg/dL — AB (ref 65–99)
GLUCOSE-CAPILLARY: 118 mg/dL — AB (ref 65–99)

## 2017-01-23 LAB — BASIC METABOLIC PANEL
Anion gap: 6 (ref 5–15)
BUN: 17 mg/dL (ref 6–20)
CHLORIDE: 105 mmol/L (ref 101–111)
CO2: 23 mmol/L (ref 22–32)
CREATININE: 0.8 mg/dL (ref 0.44–1.00)
Calcium: 6.9 mg/dL — ABNORMAL LOW (ref 8.9–10.3)
GFR calc Af Amer: >60 mL/min (ref >60)
GFR calc non Af Amer: >60 mL/min (ref >60)
GLUCOSE: 112 mg/dL — AB (ref 65–99)
POTASSIUM: 3.7 mmol/L (ref 3.5–5.1)
SODIUM: 134 mmol/L — AB (ref 135–145)

## 2017-01-23 LAB — MAGNESIUM: Magnesium: 2.3 mg/dL (ref 1.7–2.4)

## 2017-01-23 LAB — CBC
HEMATOCRIT: 29.2 % — AB (ref 36.0–46.0)
Hemoglobin: 9.4 g/dL — ABNORMAL LOW (ref 12.0–15.0)
MCH: 31.1 pg (ref 26.0–34.0)
MCHC: 32.2 g/dL (ref 30.0–36.0)
MCV: 96.7 fL (ref 78.0–100.0)
PLATELETS: 254 10*3/uL (ref 150–400)
RBC: 3.02 MIL/uL — ABNORMAL LOW (ref 3.87–5.11)
RDW: 18.9 % — AB (ref 11.5–15.5)
WBC: 7.2 10*3/uL (ref 4.0–10.5)

## 2017-01-23 MED ORDER — MEGESTROL ACETATE 40 MG PO TABS: 40.0000 mg | ORAL_TABLET | Freq: Every day | ORAL | Status: DC

## 2017-01-23 MED ORDER — ENOXAPARIN SODIUM 40 MG/0.4ML ~~LOC~~ SOLN: 40.0000 mg | SUBCUTANEOUS | 0 refills | Status: DC

## 2017-01-23 MED ORDER — OXYCODONE HCL 10 MG PO TABS: 10.0000 mg | ORAL_TABLET | ORAL | 0 refills | Status: DC | PRN

## 2017-01-23 MED ORDER — LENALIDOMIDE 15 MG PO CAPS: 15.0000 mg | ORAL_CAPSULE | Freq: Every day | ORAL | 0 refills | Status: DC

## 2017-01-23 MED ORDER — POLYETHYLENE GLYCOL 3350 17 G PO PACK: 17.0000 g | PACK | Freq: Two times a day (BID) | ORAL | 0 refills | Status: DC

## 2017-01-23 MED ORDER — MORPHINE SULFATE ER 30 MG PO TBCR: 30.0000 mg | EXTENDED_RELEASE_TABLET | Freq: Two times a day (BID) | ORAL | 0 refills | Status: DC

## 2017-01-23 NOTE — Progress Notes (Signed)
Physical Therapy Treatment Patient Details Name: Kristin [name] MRN: [id number] DOB: 10-10-1930 Today's Date: 01/23/2017    History of Present Illness Pt is an 83 y.o. female s/p R hip hemiarthroplasty. She suffered 2 weeks of worsening hip pain becoming severe and limiting ability to bear weight and x-ray revealed pathologic fracture to R femoral neck. Pt has been on comfort care measures for multiple myeloma but family would now like to pursue aggressive measures.  Pt has a PMH significant for multiple myeloma, arthritis, chronic kidney disease (stage III), depression, glaucoma, hiatal hernia, hypertension, obesity, and osteoporosis.     PT Comments    Slow improvements.  Emphasis on exercise, moving to EOB and coming up to EOB.  Standing in RW and pivoting to/from [facility].   Follow Up Recommendations  SNF;Supervision for mobility/OOB     Equipment Recommendations  None recommended by PT    Recommendations for Other Services       Precautions / Restrictions Precautions Precautions: Posterior Hip Precaution Comments: Reviewed posterior hip precautions verbally with pt and dtr Required Braces or Orthoses: Knee Immobilizer - Right (abduction brace) Knee Immobilizer - Right: On at all times Restrictions RLE Weight Bearing: Weight bearing as tolerated    Mobility  Bed Mobility Overal bed mobility: Needs Assistance Bed Mobility: Supine to Sit;Sit to Supine     Supine to sit: Max assist;+2 for physical assistance Sit to supine: Mod assist;+2 for physical assistance   General bed mobility comments: Pt assist to bridge to EOB, pt helped minimally through bring trunk forward.  She assisted significantly scoot herself back into the bed with LE's needing assist to get back into bed.  Transfers Overall transfer level: Needs assistance Equipment used: Rolling walker (2 wheeled) Transfers: Sit to/from [facility] Sit to Stand: Mod assist;+2 physical assistance Stand  pivot transfers: Mod assist;+2 safety/equipment;+2 physical assistance       General transfer comment: cues for hand placement, posture and cues/assist to move AD and support while pt tentatively and painfully pivoted to the [facility] then later back to the bed.  Ambulation/Gait             General Gait Details: not tested today.  pt fatigued for transfer to [facility] and back.   Stairs            Wheelchair Mobility    Modified Rankin (Stroke Patients Only)       Balance Overall balance assessment: Needs assistance   Sitting balance-Leahy Scale: Poor Sitting balance - Comments: Reliant on UE support or external assistance   Standing balance support: Bilateral upper extremity supported;During functional activity Standing balance-Leahy Scale: Poor Standing balance comment: Reliant on B UE support from RW and external assist.                             Cognition Arousal/Alertness: Awake/alert Behavior During Therapy: WFL for tasks assessed/performed Overall Cognitive Status: Within Functional Limits for tasks assessed                                        Exercises Total Joint Exercises Quad Sets: AROM;Strengthening;Both;10 reps;Supine Heel Slides: AAROM;Strengthening;Both;10 reps;Supine;Other (comment) (graded resistance in extension, Assist to flex.) Bridges: AROM;Strengthening;5 reps;Left;Supine    General Comments        Pertinent Vitals/Pain Pain Assessment: Faces Faces Pain Scale: Hurts even more Pain Location:  R hip Pain Descriptors / Indicators: Operative site guarding;Sore Pain Intervention(s): Monitored during session;Repositioned;Limited activity within patient's tolerance    Home Living                      Prior Function            PT Goals (current goals can now be found in the care plan section) Acute Rehab PT Goals Patient Stated Goal: not to fall PT Goal Formulation: With patient Time For Goal  Achievement: 01/28/17 Potential to Achieve Goals: Fair Progress towards PT goals: Progressing toward goals    Frequency    Min 2X/week      PT Plan Current plan remains appropriate    Co-evaluation              AM-PAC PT "6 Clicks" Daily Activity  Outcome Measure  Difficulty turning over in bed (including adjusting bedclothes, sheets and blankets)?: Total Difficulty moving from lying on back to sitting on the side of the bed? : Total Difficulty sitting down on and standing up from a chair with arms (e.g., wheelchair, bedside commode, etc,.)?: Total Help needed moving to and from a bed to chair (including a wheelchair)?: A Lot Help needed walking in hospital room?: A Lot Help needed climbing 3-5 steps with a railing? : A Lot 6 Click Score: 9    End of Session   Activity Tolerance: Patient tolerated treatment well Patient left: in bed;with call bell/phone within reach;with bed alarm set;with family/visitor present Nurse Communication: Mobility status;Precautions PT Visit Diagnosis: Unsteadiness on feet (R26.81);Muscle weakness (generalized) (M62.81);Pain Pain - Right/Left: Right Pain - part of body: Hip     Time: 3474-2595 PT Time Calculation (min) (ACUTE ONLY): 23 min  Charges:  $Therapeutic Exercise: 8-22 mins $Therapeutic Activity: 8-22 mins                    G Codes:       31-Jan-2017  Donnella Sham, PT [phone]  (pager)   Tessie Fass Aicha Clingenpeel Jan 31, 2017, 5:22 PM

## 2017-01-23 NOTE — [place] FL2 (Signed)
[place] MEDICAID FL2 LEVEL OF CARE SCREENING TOOL     IDENTIFICATION  Patient Name: Kristin [name] Birthdate: 09/14/1930 Sex: female Admission Date (Current Location): 01/18/2017  [facility] and [place] Number:  Herbalist and Address:  The . [facility], [place] [address], [place], [place] [address]      Provider Number: [id number]  Attending Physician Name and Address:  Mendel Corning, MD  Relative Name and Phone Number:       Current Level of Care: Hospital Recommended Level of Care: [place] Prior Approval Number:    Date Approved/Denied:   PASRR Number: [id number]  Discharge Plan: SNF    Current Diagnoses: Patient Active Problem List   Diagnosis Date Noted  . Palliative care by specialist   . CKD (chronic kidney disease), stage III 01/18/2017  . Hip fracture, pathological ([place]) 01/18/2017  . Acute lower UTI 01/18/2017  . Pathologic hip fracture ([place]) 01/18/2017  . Hyperkalemia 01/18/2017  . DNR (do not resuscitate) discussion 11/01/2016  . Multiple myeloma ([place]) 10/31/2016  . Acute encephalopathy 10/18/2016  . Hypercalcemia 10/18/2016  . Fall   . Anemia in neoplastic disease 09/23/2016  . Sepsis ([place]) 09/23/2016  . Back pain 09/23/2016  . Intractable back pain 06/20/2016  . Other malaise and fatigue 11/26/2013  . Vertigo   . Obesity   . Arthritis of knee, left 07/09/2013  . Need for prophylactic vaccination and inoculation against influenza 07/09/2013  . Acute renal failure superimposed on stage 3 chronic kidney disease ([place]) 01/01/2013  . Osteoporosis with pathological fracture 01/01/2013  . HLD (hyperlipidemia) 01/01/2013  . HTN (hypertension) 01/01/2013  . Chronic back pain 01/01/2013  . Vertebral compression fracture ([place]) 01/01/2013  . Seasonal allergic rhinitis 01/01/2013    Orientation RESPIRATION BLADDER Height & Weight     Self, Place  Normal Incontinent, External catheter Weight: 63.9 kg (140 lb  14 oz) Height:  4' 9"  (144.8 cm)  BEHAVIORAL SYMPTOMS/MOOD NEUROLOGICAL BOWEL NUTRITION STATUS      Incontinent Diet (Please see DC Summary)  AMBULATORY STATUS COMMUNICATION OF NEEDS Skin   Extensive Assist Verbally Surgical wounds (Closed incision on hip)                       Personal Care Assistance Level of Assistance  Bathing, Feeding, Dressing Bathing Assistance: Maximum assistance Feeding assistance: Independent Dressing Assistance: Limited assistance     Functional Limitations Info             SPECIAL CARE FACTORS FREQUENCY  PT (By licensed PT)     PT Frequency: 5x/week              Contractures      Additional Factors Info  Code Status, Allergies Code Status Info: Full Allergies Info: Penicillins, Alendronate, Sulfonamide Derivatives           Current Medications (01/23/2017):  This is the current hospital active medication list Current Facility-Administered Medications  Medication Dose Route Frequency Provider Last Rate Last Dose  . acetaminophen (TYLENOL) tablet 650 mg  650 mg Oral Q6H PRN Rise Patience, MD      . cycloSPORINE (RESTASIS) 0.05 % ophthalmic emulsion 1 drop  1 drop Both Eyes Daily Opyd, Ilene Qua, MD   1 drop at 01/22/17 1013  . enoxaparin (LOVENOX) injection 40 mg  40 mg Subcutaneous Q24H Rai, Ripudeep K, MD   40 mg at 01/22/17 1013  . feeding supplement (ENSURE ENLIVE) (ENSURE ENLIVE)  liquid 237 mL  237 mL Oral TID BM Rai, Ripudeep K, MD   237 mL at 01/22/17 2013  . HYDROcodone-acetaminophen (NORCO/VICODIN) 5-325 MG per tablet 1-2 tablet  1-2 tablet Oral Q6H PRN Opyd, Ilene Qua, MD   1 tablet at 01/22/17 1958  . meclizine (ANTIVERT) tablet 25 mg  25 mg Oral TID PRN Opyd, Ilene Qua, MD      . megestrol (MEGACE) tablet 40 mg  40 mg Oral Daily Rai, Ripudeep K, MD   40 mg at 01/22/17 1013  . mirtazapine (REMERON) tablet 7.5 mg  7.5 mg Oral QHS Opyd, Ilene Qua, MD   7.5 mg at 01/22/17 2222  . morphine ([name]) 12 hr tablet 30  mg  30 mg Oral Q12H Opyd, Ilene Qua, MD   30 mg at 01/22/17 2222  . morphine 2 MG/ML injection 1-2 mg  1-2 mg Intravenous Q2H PRN Opyd, Ilene Qua, MD   2 mg at 01/20/17 0121  . oxyCODONE (Oxy IR/ROXICODONE) immediate release tablet 10 mg  10 mg Oral Q4H PRN Opyd, Ilene Qua, MD   10 mg at 01/21/17 1745  . polyethylene glycol (MIRALAX / GLYCOLAX) packet 17 g  17 g Oral BID Rai, Ripudeep K, MD   17 g at 01/22/17 2223  . risperiDONE (RISPERDAL) tablet 0.5 mg  0.5 mg Oral BID Opyd, Ilene Qua, MD   0.5 mg at 01/22/17 2223  . senna-docusate (Senokot-S) tablet 2 tablet  2 tablet Oral BID Rai, Ripudeep K, MD   2 tablet at 01/22/17 2223     Discharge Medications: Please see discharge summary for a list of discharge medications.  Relevant Imaging Results:  Relevant Lab Results:   Additional Information ss # [id number]  Benard Halsted, LCSWA

## 2017-01-23 NOTE — Progress Notes (Signed)
Daily Progress Note   Patient Name: Kristin [name]       Date: 01/23/2017 DOB: [birth year]  Age: 83 y.o. MRN#: [id number] Attending Physician: Mendel Corning, MD Primary Care Physician: [name], MD Admit Date: 01/18/2017  Reason for Consultation/Follow-up: Establishing goals of care  Subjective:  Patient in bed, sitting up, eating. States today pain is not as bad. Notes her appetite is improving. She is looking forward to discharge.  Met privately outside with patient's daughter. She states they are hopeful for patient to go to rehab and start cancer therapy with a goal of patient surviving a few more months so that family members can visit from [place]. They are interested in follow-up from outpatient Palliative Medicine. We discussed code status. Patient was previously DNR, but code status was changed prior to surgery with plan to change back after surgery. Daughters plan to discuss and will notify nursing as to their decision so that patient's status can be changed if needed.     ROS  Length of Stay: 5  Current Medications: Scheduled Meds:  . cycloSPORINE  1 drop Both Eyes Daily  . enoxaparin (LOVENOX) injection  40 mg Subcutaneous Q24H  . feeding supplement (ENSURE ENLIVE)  237 mL Oral TID BM  . megestrol  40 mg Oral Daily  . mirtazapine  7.5 mg Oral QHS  . morphine  30 mg Oral Q12H  . polyethylene glycol  17 g Oral BID  . risperiDONE  0.5 mg Oral BID  . senna-docusate  2 tablet Oral BID    Continuous Infusions:   PRN Meds: acetaminophen, HYDROcodone-acetaminophen, meclizine, morphine injection, oxyCODONE  Physical Exam  Constitutional: She appears well-developed and well-nourished.  Cardiovascular: Normal rate.   Pulmonary/Chest: Effort normal.  Neurological: She  is alert.  Skin: Skin is warm and dry.  Psychiatric: She has a normal mood and affect. Her behavior is normal.  Nursing note and vitals reviewed.           Vital Signs: BP 110/60 (BP Location: Left Arm)   Pulse 89   Temp 98.2 F (36.8 C)   Resp 14   Ht 4' 9"  (1.448 m)   Wt 63.9 kg (140 lb 14 oz)   SpO2 97%   BMI 30.48 kg/m  SpO2: SpO2: 97 % O2 Device: O2 Device: Not  Delivered O2 Flow Rate: O2 Flow Rate (L/min): 1 L/min  Intake/output summary:  Intake/Output Summary (Last 24 hours) at 01/23/17 1312 Last data filed at 01/23/17 1017  Gross per 24 hour  Intake              290 ml  Output                0 ml  Net              290 ml   LBM: Last BM Date: 01/22/17 Baseline Weight: Weight: 63.9 kg (140 lb 14 oz) Most recent weight: Weight: 63.9 kg (140 lb 14 oz)       Palliative Assessment/Data: PPS: 40%   Flowsheet Rows     Most Recent Value  Intake Tab  Referral Department  Hospitalist  Unit at Time of Referral  Med/Surg Unit  Palliative Care Primary Diagnosis  Cancer  Date Notified  01/18/17  Palliative Care Type  New Palliative care  Reason for referral  Clarify Goals of Care  Date of Admission  01/18/17  Date first seen by Palliative Care  01/19/17  # of days Palliative referral response time  1 Day(s)  # of days IP prior to Palliative referral  0  Clinical Assessment  Psychosocial & Spiritual Assessment  Palliative Care Outcomes      Patient Active Problem List   Diagnosis Date Noted  . Palliative care by specialist   . CKD (chronic kidney disease), stage III 01/18/2017  . Hip fracture, pathological ([place]) 01/18/2017  . Acute lower UTI 01/18/2017  . Pathologic hip fracture ([place]) 01/18/2017  . Hyperkalemia 01/18/2017  . DNR (do not resuscitate) discussion 11/01/2016  . Multiple myeloma ([place]) 10/31/2016  . Acute encephalopathy 10/18/2016  . Hypercalcemia 10/18/2016  . Fall   . Anemia in neoplastic disease 09/23/2016  . Sepsis ([place]) 09/23/2016  . Back  pain 09/23/2016  . Intractable back pain 06/20/2016  . Other malaise and fatigue 11/26/2013  . Vertigo   . Obesity   . Arthritis of knee, left 07/09/2013  . Need for prophylactic vaccination and inoculation against influenza 07/09/2013  . Acute renal failure superimposed on stage 3 chronic kidney disease ([place]) 01/01/2013  . Osteoporosis with pathological fracture 01/01/2013  . HLD (hyperlipidemia) 01/01/2013  . HTN (hypertension) 01/01/2013  . Chronic back pain 01/01/2013  . Vertebral compression fracture ([place]) 01/01/2013  . Seasonal allergic rhinitis 01/01/2013    Palliative Care Assessment & Plan   Patient Profile: 83 y.o. female   admitted on 01/18/2017 with past  medical history significant forhypertension, chronic kidney disease stage III, depression, and multiple myeloma receiving palliative treatments/ Dr Burr Medico, admitted  to the emergency department for evaluation of severe right hip pain.In ED  CTof the hip demonstrated pathologic fracture in the right femoral neck with a large lytic lesion at the superior aspect of the lateral femoral head and diffuse myeloma. Palliative medicine consulted for advanced directives and GOC.   Assessment/Recommendations/Plan   D/C to SNF for rehab  Outpatient palliative f/u at Rehab    Code Status:  Full code  Prognosis:   Unable to determine  Discharge Planning:  [facility] for rehab with Palliative care service follow-up  Care plan was discussed with patient and her 2 daughters.  Thank you for allowing the Palliative Medicine Team to assist in the care of this patient.   Time In: 1245 Time Out: 1320 Total Time 40 mins Prolonged Time Billed No  Greater than 50%  of this time was spent counseling and coordinating care related to the above assessment and plan.  Mariana Kaufman, AGNP-C Palliative Medicine   Please contact Palliative Medicine Team phone at [phone] for questions and concerns.

## 2017-01-23 NOTE — Discharge Instructions (Signed)
**   Primary MD- Refer for Outpatient Palliative services after admitted to [facility] for continued GOC and symptom management of advanced multiple myeloma

## 2017-01-23 NOTE — Discharge Summary (Signed)
Physician Discharge Summary   Patient ID: Kristin [name] MRN: [id number] DOB/AGE: Jan 05, 1931 83 y.o.  Admit date: 01/18/2017 Discharge date: 01/23/2017  Primary Care Physician:  [name], MD  Discharge Diagnoses:   . Right Hip fracture, pathological ([place] [place] [place]) . Acute Ecoli UTI . HTN (hypertension) . Multiple myeloma ([place]) . Anemia in neoplastic disease . CKD (chronic kidney disease), stage III . Constipation . acute on chronic blood loss anemia secondary to surgery, chronic Macrocytic anemia    Consults:  Orthopedics Hematology oncology  Recommendations for Outpatient Follow-up:  1. Skilled nursing facility recommended by physical therapy 2. Please repeat CBC/BMET at next visit 3. Per hematology/oncology, Revlimid is currently on hold, Dr Burr Medico will consider to restart it in 3-4 weeks when she will see her back in the clinic 4. Weightbearing as tolerated, Lovenox for DVT prophylaxis   DIET: Heart healthy diet    Allergies:   Allergies  Allergen Reactions  . Penicillins Rash    Has patient had a PCN reaction causing immediate rash, facial/tongue/throat swelling, SOB or lightheadedness with hypotension: no Has patient had a PCN reaction causing severe rash involving mucus membranes or skin necrosis: no Has patient had a PCN reaction that required hospitalization no Has patient had a PCN reaction occurring within the last 10 years:no If all of the above answers are "NO", then may proceed with Cephalosporin use.    . Alendronate Nausea Only and Other (See Comments)    Hip pain  . Sulfonamide Derivatives Itching and Rash     DISCHARGE MEDICATIONS: Current Discharge Medication List    START taking these medications   Details  enoxaparin (LOVENOX) 40 MG/0.4ML injection Inject 0.4 mLs (40 mg total) into the skin daily. Qty: 21 Syringe, Refills: 0    megestrol (MEGACE) 40 MG tablet Take 1 tablet (40 mg total) by mouth daily.    polyethylene glycol (MIRALAX /  GLYCOLAX) packet Take 17 g by mouth 2 (two) times daily. Qty: 14 each, Refills: 0      CONTINUE these medications which have CHANGED   Details  lenalidomide (REVLIMID) 15 MG capsule Take 1 capsule (15 mg total) by mouth daily. ON HOLD UNTIL FURTHER INSTRUCTIONS BY DR Burr Medico. Take 15 mg daily for  21  Days  ,  Off  7  Days. Qty: 21 capsule, Refills: 0   Associated Diagnoses: Multiple myeloma not having achieved remission (HCC)    morphine ([name]) 30 MG 12 hr tablet Take 1 tablet (30 mg total) by mouth every 12 (twelve) hours. Qty: 10 tablet, Refills: 0    Oxycodone HCl 10 MG TABS Take 1 tablet (10 mg total) by mouth every 4 (four) hours as needed. FOR MODERATE OR SEVERE PAIN Qty: 20 tablet, Refills: 0      CONTINUE these medications which have NOT CHANGED   Details  bisacodyl (DULCOLAX) 10 MG suppository Place 1 suppository (10 mg total) rectally daily as needed for moderate constipation. Qty: 12 suppository, Refills: 0    dexamethasone (DECADRON) 4 MG tablet Take 5 tablets (20 mg total) by mouth once a week. Qty: 30 tablet, Refills: 1    feeding supplement, ENSURE ENLIVE, (ENSURE ENLIVE) LIQD Take 237 mLs by mouth 2 (two) times daily between meals. Qty: 237 mL, Refills: 12    magnesium oxide (MAG-OX) 400 (241.3 Mg) MG tablet Take 1 tablet (400 mg total) by mouth daily. Qty: 7 tablet, Refills: 0    meclizine (ANTIVERT) 25 MG tablet TAKE ONE TABLET BY MOUTH THREE  TIMES DAILY AS NEEDED Qty: 90 tablet, Refills: 0    mirtazapine (REMERON) 7.5 MG tablet TAKE 1 TABLET (7.5 MG TOTAL) BY MOUTH AT BEDTIME. Qty: 30 tablet, Refills: 0    ondansetron (ZOFRAN ODT) 4 MG disintegrating tablet Take 1-2 tablets (4-8 mg total) by mouth every 8 (eight) hours as needed for nausea or vomiting. Qty: 20 tablet, Refills: 2    RESTASIS 0.05 % ophthalmic emulsion Place 1 drop into both eyes daily.     risperiDONE (RISPERDAL) 0.5 MG tablet Take 0.5 mg by mouth 2 (two) times daily.     sennosides-docusate sodium (SENOKOT-S) 8.6-50 MG tablet Take 2 tablets by mouth daily.      STOP taking these medications     amLODipine (NORVASC) 5 MG tablet          Brief H and P: For complete details please refer to admission H and P, but in brief Patient is a 83 year old female with hypertension, CKD stage III, depression, multiple myeloma receiving palliative treatments presented to the ED after severe right hip pain. Patient reported being bedbound for past 2 weeks due to severe pain in the right hip and prior to this she was able to walk with a walker. Patient's daughter also noted some pink spots that appear to be blood on her underwear last night. CT of the hip showed pathology to fracture in the right femoral neck with large lytic lesion at the superior aspect of the lateral femoral head and diffuse myeloma. UA was positive for UTI and grossly bloody.  Hospital Course:  Pathologic right hip fracture Secondary to multiple myeloma -Patient presented with severe right hip pain, found to have pathological right hip fracture with a large lytic lesion on the CT scan.  - Orthopedics was consulted and patient underwent right hip hemiarthroplasty on 6/7, postop day # 4 at discharge - Patient was transfused 2 units packed RBCs on 6/84 the hemoglobin of 6.4. At the time of discharge, hemoglobin is stable at 9.4. - Appreciate hematology recommendations, placed on Lovenox for DVT prophylaxis and also added megace for appetite stimulant - Patient working with physical therapy, recommended skilled nursing facility  Constipation - Resolved, continue bowel regimen    Multiple myeloma  -Patient  follows oncology, Dr. Burr Medico  who followed the patient during hospitalization - She was treated with lenalidomide, currently on hold.  Dr. Pilar Plate will reconsider starting lenalidomide in 3-4 weeks when she sees her back in clinic    Hyperkalemia - Serum potassium 5.4 on admission without EKG changes  , now resolved - Likely secondary to renal insufficiency and potassium currently stable. Potassium 3.7 at the time of discharge.      acute on chronic blood loss anemia secondary to surgery, chronic Macrocytic anemia -Patient was  transfused 2 units packed RBCs on 6/8, hemoglobin now stable, 9.4 at the time of discharge  - Patient has chronic anemia secondary to multiple myeloma  Escherichia coli UTI  Urine cultures consistent with more than 100,000 colonies of Escherichia coli, pansensitive - Patient has received 5 days of IV Rocephin, does not need any further Rocephin at the time of discharge.   Hypertension  -  BP now stable. Norvasc is discontinued    CKD stage III  - SCr is 1.11 on admission,  - Creatinine 0.8 today   Hypocalcemia - Patient was given calcium gluconate 1 g. magnesium is 2.3 - Corrected calcium today 8.4   Nutrition -Patient tolerating regular diet  Day of Discharge BP  110/60 (BP Location: Left Arm)   Pulse 89   Temp 98.2 F (36.8 C)   Resp 14   Ht 4' 9"  (1.448 m)   Wt 63.9 kg (140 lb 14 oz)   SpO2 97%   BMI 30.48 kg/m   Physical Exam: General: Alert and awake oriented x3 not in any acute distress. HEENT: anicteric sclera, pupils reactive to light and accommodation CVS: S1-S2 clear no murmur rubs or gallops Chest: clear to auscultation bilaterally, no wheezing rales or rhonchi Abdomen: soft nontender, nondistended, normal bowel sounds Extremities: no cyanosis, clubbing or edema noted bilaterally , right leg in immobilizer  Neuro: Cranial nerves II-XII intact, no focal neurological deficits   The results of significant diagnostics from this hospitalization (including imaging, microbiology, ancillary and laboratory) are listed below for reference.    LAB RESULTS: Basic Metabolic Panel:  Recent Labs Lab 01/22/17 0513  01/23/17 0326  NA 137  --  134*  K 3.8  --  3.7  CL 106  --  105  CO2 24  --  23  GLUCOSE 117*  --  112*  BUN 17   --  17  CREATININE 0.89  --  0.80  CALCIUM 6.4*  --  6.9*  MG  --   < > 2.3  < > = values in this interval not displayed. Liver Function Tests:  Recent Labs Lab 01/22/17 1130  AST 24  ALT 15  ALKPHOS 80  BILITOT 0.9  PROT 4.9*  ALBUMIN 2.1*   No results for input(s): LIPASE, AMYLASE in the last 168 hours. No results for input(s): AMMONIA in the last 168 hours. CBC:  Recent Labs Lab 01/18/17 1803  01/22/17 0513 01/23/17 0326  WBC 6.9  < > 8.3 7.2  NEUTROABS 3.4  --   --   --   HGB 8.6*  < > 9.1* 9.4*  HCT 26.0*  < > 27.8* 29.2*  MCV 102.0*  < > 96.9 96.7  PLT 165  < > 216 254  < > = values in this interval not displayed. Cardiac Enzymes: No results for input(s): CKTOTAL, CKMB, CKMBINDEX, TROPONINI in the last 168 hours. BNP: Invalid input(s): POCBNP CBG:  Recent Labs Lab 01/23/17 0427 01/23/17 0754  GLUCAP 113* 118*    Significant Diagnostic Studies:  Pelvis Portable  Result Date: 01/19/2017 CLINICAL DATA:  Femur fracture. EXAM: PORTABLE PELVIS 1-2 VIEWS COMPARISON:  CT scan 01/18/2017 FINDINGS: One-view portable exam of the lower pelvis at 1755 hours shows the patient be status post right hip hemiarthroplasty. Bones diffusely demineralized. No evidence for immediate hardware complications. IMPRESSION: Status post right hip hemiarthroplasty. Electronically Signed   By: Misty Stanley M.D.   On: 01/19/2017 18:42   Ct Hip Right Wo Contrast  Result Date: 01/18/2017 CLINICAL DATA:  Chronic right hip pain in patient with history of multiple myeloma. No known injury. Initial encounter. EXAM: CT OF THE RIGHT HIP WITHOUT CONTRAST TECHNIQUE: Multidetector CT imaging of the right hip was performed according to the standard protocol. Multiplanar CT image reconstructions were also generated. COMPARISON:  Plain films right hip 01/02/2017. FINDINGS: Bones/Joint/Cartilage The patient has a fracture through the right femoral neck. The fracture is mainly basicervical. There is a large  lytic lesion in the superior aspect of the lateral femoral head. Extensive bony loss is present at the site of the fracture consistent with a pathologic injury in this patient with a history of multiple myeloma. There may be an incomplete and nondisplaced fracture of the right  inferior pubic ramus. No other fracture is identified. All imaged bones demonstrate findings consistent with diffuse myelomatous involvement with loss of medullary bone and extensive endosteal scalloping. Ligaments Suboptimally assessed by CT. Muscles and Tendons Intact. Soft tissues Some soft tissue edema about the right hip. Atherosclerotic calcifications are noted. IMPRESSION: Findings consistent with a pathologic fracture of the right femoral neck. A large lytic lesion in the superior aspect of the lateral femoral head is identified. Possible nondisplaced fracture of the right inferior pubic ramus. Diffuse myeloma. Electronically Signed   By: Inge Rise M.D.   On: 01/18/2017 15:59   Dg Chest Port 1 View  Result Date: 01/18/2017 CLINICAL DATA:  patient from home, hx multiple myeloma. Daughter noticed light pink spotting in brief beginning last night. Acute/chronic right hip pain. Denies abdominal pain, N/V/D, chest pain and SOB. Hx hypertension - hx chronic kidney disease EXAM: PORTABLE CHEST - 1 VIEW COMPARISON:  10/22/2016 FINDINGS: Low lung volumes.  No focal infiltrate or overt edema. Heart size upper limits normal.  Tortuous atheromatous aorta. No effusion. Degenerative spurring in bilateral shoulders. Changes of previous mid thoracic vertebral augmentation. IMPRESSION: Low volumes.  No acute disease. Electronically Signed   By: Lucrezia Europe M.D.   On: 01/18/2017 20:19    2D ECHO:   Disposition and Follow-up: Discharge Instructions    Diet - low sodium heart healthy    Complete by:  As directed    Increase activity slowly    Complete by:  As directed        [place]    Renette Butters, MD Follow up in 2 week(s).   Specialty:  Orthopedic Surgery Contact information: [address] [place] [address] [phone]        [name], MD. Schedule an appointment as soon as possible for a visit in 2 week(s).   Specialty:  Family Medicine Contact information: [place] [address] [phone]        Truitt Merle, MD. Schedule an appointment as soon as possible for a visit in 3 week(s).   Specialties:  Hematology, Oncology Why:  3-4 weeks for follow-up  Contact information: [place] [address] [phone]            Time spent on Discharge: 45 minutes  Signed:   Estill Cotta M.D. [facility] 01/23/2017, 12:30 PM Pager: [phone]

## 2017-01-23 NOTE — Progress Notes (Signed)
   Assessment: 4 Days Post-Op  S/P Procedure(s) (LRB): RIGHT HIP HEMIARTHROPLASTY (Right) by Dr. Ernesta Amble. Percell Miller on 01/19/17  Principal Problem:   Hip fracture, pathological ([place]) Active Problems:   HTN (hypertension)   Anemia in neoplastic disease   Multiple myeloma (HCC)   CKD (chronic kidney disease), stage III   Acute lower UTI   Pathologic hip fracture (HCC)   Hyperkalemia   Palliative care by specialist  ADDITIONAL DIAGNOSIS:  Acute Blood Loss Anemia, Hgb stable  Pain significantly improved and overall feeling better than pre-op.  Stable for d/c from an orthopedic perspective.  Plan: Up with therapy as tolerated Incentive Spirometry Apply ice  Follow up in the office with Dr. Alain Marion in 2 weeks.  Please call with questions.   Weight Bearing: Weight Bearing as Tolerated (WBAT) RLE.  Posterior Hip precautions.  Knee immobilizer only needed QHS to facilitate Posterior hip precautions. Dressings: PRN.  VTE prophylaxis:     Lovenox.  SCDs, ambulation.  Recommend Lovenox for 30 days post op. Dispo: Per primary.  SNF planning in progress.  Subjective: Patient reports pain as mild to moderate, improved, and controlled.    Objective:   VITALS:   Vitals:   01/22/17 0013 01/22/17 0424 01/22/17 1246 01/22/17 1252  BP: (!) 116/56 119/67 (!) 96/48 110/60  Pulse: 93 92 89   Resp: 18 18 14    Temp: 98.4 F (36.9 C) 99.1 F (37.3 C) 98.2 F (36.8 C)   TempSrc:      SpO2: 100% 100% 97%   Weight:      Height:       CBC Latest Ref Rng & Units 01/23/2017 01/22/2017 01/21/2017  WBC 4.0 - 10.5 K/uL 7.2 8.3 8.9  Hemoglobin 12.0 - 15.0 g/dL 9.4(L) 9.1(L) 9.5(L)  Hematocrit 36.0 - 46.0 % 29.2(L) 27.8(L) 29.2(L)  Platelets 150 - 400 K/uL 254 216 176   BMP Latest Ref Rng & Units 01/23/2017 01/22/2017 01/21/2017  Glucose 65 - 99 mg/dL 112(H) 117(H) 98  BUN 6 - 20 mg/dL 17 17 16   Creatinine 0.44 - 1.00 mg/dL 0.80 0.89 0.76  BUN/Creat Ratio 11 - 26 - - -  Sodium 135 - 145 mmol/L  134(L) 137 138  Potassium 3.5 - 5.1 mmol/L 3.7 3.8 3.8  Chloride 101 - 111 mmol/L 105 106 106  CO2 22 - 32 mmol/L 23 24 24   Calcium 8.9 - 10.3 mg/dL 6.9(L) 6.4(LL) 6.6(L)   Intake/Output      06/10 0701 - 06/11 0700 06/11 0701 - 06/12 0700   P.O. 120    IV Piggyback 50    Total Intake(mL/kg) 170 (2.7)    Urine (mL/kg/hr)     Stool     Total Output       Net +170          Urine Occurrence 1 x    Stool Occurrence 2 x     Physical Exam: General: NAD.  Supine in bed.  Conversant.  No increased WOB.  Daughter at bedside. MSK Neurovascularly intact Sensation intact distally Feet warm Dorsiflexion/Plantar flexion intact Incision: dressing C/D/I   Prudencio Burly III, PA-C 01/23/2017, 8:26 AM

## 2017-01-23 NOTE — Clinical Social Work Note (Signed)
Clinical Social Work Assessment  Patient Details  Name: Kristin [name] MRN: [id number] Date of Birth: 06/27/1931  Date of referral:  01/23/17               Reason for consult:  Facility Placement                Permission sought to share information with:  Facility Sport and exercise psychologist, Family Supports Permission granted to share information::  Yes, Verbal Permission Granted  Name::     Midwife::  SNFs  Relationship::  Daughter  Contact Information:  [phone]  Housing/Transportation Living arrangements for the past 2 months:  [place] of Information:  Patient, Adult Children Patient Interpreter Needed:  None Criminal Activity/Legal Involvement Pertinent to Current Situation/Hospitalization:  No - Comment as needed Significant Relationships:  Adult Children Lives with:  Adult Children Do you feel safe going back to the place where you live?  No Need for family participation in patient care:  Yes (Comment)  Care giving concerns:  CSW received consult for possible SNF placement at time of discharge. CSW spoke with patient and her daughter, Kristin [name], regarding PT recommendation of SNF placement at time of discharge. Patient's daughter explained that they wanted to revoke their hospice benefit to pursue treatment. Patient expressed understanding of PT recommendation and is agreeable to SNF placement at time of discharge. CSW to continue to follow and assist with discharge planning needs.   Social Worker assessment / plan:  CSW spoke with patient and her daughter concerning possibility of rehab at [facility] before returning home.  Employment status:  Retired Nurse, adult PT Recommendations:  [place] / Referral to community resources:  [place]  Patient/Family's Response to care:  Patient recognizes need for rehab before returning home and is agreeable to a SNF in [place]. Patient  reported preference for [facility] and will need PTAR.   Patient/Family's Understanding of and Emotional Response to Diagnosis, Current Treatment, and Prognosis:  Patient/family is realistic regarding therapy needs and expressed being hopeful for SNF placement. Patient expressed understanding of CSW role and discharge process as well as her medical condition. No questions/concerns about plan or treatment.    Emotional Assessment Appearance:  Appears stated age Attitude/Demeanor/Rapport:  Other (Appropriate) Affect (typically observed):  Accepting, Appropriate Orientation:  Oriented to Self, Oriented to Place, Oriented to Situation Alcohol / Substance use:  Not Applicable Psych involvement (Current and /or in the community):  No (Comment)  Discharge Needs  Concerns to be addressed:  Care Coordination Readmission within the last 30 days:  No Current discharge risk:  None Barriers to Discharge:  No Barriers Identified   Benard Halsted, [place] 01/23/2017, 12:46 PM

## 2017-01-23 NOTE — Progress Notes (Signed)
[facility] Liaison:  RN  Followed up with Claiborne Billings, RN.  Patient was discharged to [facility].  No longer in the hospital.   Thanks, Edyth Gunnels, RN, [facility] Liaison [phone]

## 2017-01-23 NOTE — Progress Notes (Signed)
Patient will DC to: [facility] Anticipated DC date: 01/23/17 Family notified: Daughters Transport by: Corey Harold   Per MD patient ready for DC to [place]. RN, patient, patient's family, and facility notified of DC. Discharge Summary sent to facility. RN given number for report. DC packet on chart. Ambulance transport requested for patient.   CSW signing off.  Cedric Fishman, [place] Social Worker [phone]

## 2017-01-23 NOTE — Clinical Social Work Placement (Signed)
   CLINICAL SOCIAL WORK PLACEMENT  NOTE  Date:  01/23/2017  Patient Details  Name: Kristin [name] MRN: [id number] Date of Birth: 1931-04-06  Clinical Social Work is seeking post-discharge placement for this patient at the [place] level of care (*CSW will initial, date and re-position this form in  chart as items are completed):  Yes   Patient/family provided with [facility] Work Department's list of facilities offering this level of care within the geographic area requested by the patient (or if unable, by the patient's family).  Yes   Patient/family informed of their freedom to choose among providers that offer the needed level of care, that participate in Medicare, Medicaid or managed care program needed by the patient, have an available bed and are willing to accept the patient.  Yes   Patient/family informed of [facility]'s ownership interest in [facility] and [facility], as well as of the fact that they are under no obligation to receive care at these facilities.  PASRR submitted to EDS on       PASRR number received on       Existing PASRR number confirmed on 01/23/17     FL2 transmitted to all facilities in geographic area requested by pt/family on 01/23/17     FL2 transmitted to all facilities within larger geographic area on       Patient informed that his/her managed care company has contracts with or will negotiate with certain facilities, including the following:        Yes   Patient/family informed of bed offers received.  Patient chooses bed at [facility]     Physician recommends and patient chooses bed at      Patient to be transferred to [facility] on 01/23/17.  Patient to be transferred to facility by PTAR     Patient family notified on 01/23/17 of transfer.  Name of family member notified:  Daughters     PHYSICIAN       Additional Comment:    _______________________________________________ Benard Halsted, [place] 01/23/2017, 12:49 PM

## 2017-01-23 NOTE — Progress Notes (Signed)
NURSING PROGRESS NOTE  Kristin [name] [id number] Discharge Data: 01/23/2017 3:55 PM Attending Provider: Mendel Corning, MD [name], Kristin Shell, MD     Kristin [name] to be D/C'd Kristin [name] a  Skilled nursing facility per MD order.  All IV's discontinued with no bleeding noted. All belongings returned to patient for patient to take home. Report called to Kristin [name], Therapist, sports at [facility].   Last Vital Signs:  Blood pressure 110/60, pulse 89, temperature 98.2 F (36.8 C), resp. rate 14, height 4' 9"  (1.448 m), weight 63.9 kg (140 lb 14 oz), SpO2 97 %.  Discharge Medication List Allergies as of 01/23/2017      Reactions   Penicillins Rash   Has patient had a PCN reaction causing immediate rash, facial/tongue/throat swelling, SOB or lightheadedness with hypotension: no Has patient had a PCN reaction causing severe rash involving mucus membranes or skin necrosis: no Has patient had a PCN reaction that required hospitalization no Has patient had a PCN reaction occurring within the last 10 years:no If all of the above answers are "NO", then may proceed with Cephalosporin use.   Alendronate Nausea Only, Other (See Comments)   Hip pain   Sulfonamide Derivatives Itching, Rash      Medication List    STOP taking these medications   amLODipine 5 MG tablet Commonly known as:  NORVASC     TAKE these medications   bisacodyl 10 MG suppository Commonly known as:  DULCOLAX Place 1 suppository (10 mg total) rectally daily as needed for moderate constipation.   dexamethasone 4 MG tablet Commonly known as:  DECADRON Take 5 tablets (20 mg total) by mouth once a week. Notes to patient:  Pt has not received this while in the hospital. Not sure what one day of the week she takes this medication.    enoxaparin 40 MG/0.4ML injection Commonly known as:  LOVENOX Inject 0.4 mLs (40 mg total) into the skin daily. Start taking on:  01/24/2017   feeding supplement (ENSURE ENLIVE) Liqd Take 237 mLs by  mouth 2 (two) times daily between meals.   lenalidomide 15 MG capsule Commonly known as:  REVLIMID Take 1 capsule (15 mg total) by mouth daily. ON HOLD UNTIL FURTHER INSTRUCTIONS BY Kristin [name]. Take 15 mg daily for  21  Days  ,  Off  7  Days. What changed:  additional instructions Notes to patient:  Medication is on Hold until Kristin. Burr [name] instructs further.   magnesium oxide 400 (241.3 Mg) MG tablet Commonly known as:  MAG-OX Take 1 tablet (400 mg total) by mouth daily.   meclizine 25 MG tablet Commonly known as:  ANTIVERT TAKE ONE TABLET BY MOUTH THREE TIMES DAILY AS NEEDED What changed:  how much to take  how to take this  when to take this  reasons to take this  additional instructions   megestrol 40 MG tablet Commonly known as:  MEGACE Take 1 tablet (40 mg total) by mouth daily. Start taking on:  01/24/2017   mirtazapine 7.5 MG tablet Commonly known as:  REMERON TAKE 1 TABLET (7.5 MG TOTAL) BY MOUTH AT BEDTIME.   morphine 30 MG 12 hr tablet Commonly known as:  [name] Take 1 tablet (30 mg total) by mouth every 12 (twelve) hours.   ondansetron 4 MG disintegrating tablet Commonly known as:  ZOFRAN ODT Take 1-2 tablets (4-8 mg total) by mouth every 8 (eight) hours as needed for nausea or vomiting.   Oxycodone HCl 10 MG  Tabs Take 1 tablet (10 mg total) by mouth every 4 (four) hours as needed. FOR MODERATE OR SEVERE PAIN What changed:  when to take this  reasons to take this  additional instructions Notes to patient:  Last dose given at 1538 on 01/23/17   polyethylene glycol packet Commonly known as:  MIRALAX / GLYCOLAX Take 17 g by mouth 2 (two) times daily.   RESTASIS 0.05 % ophthalmic emulsion Generic drug:  cycloSPORINE Place 1 drop into both eyes daily.   risperiDONE 0.5 MG tablet Commonly known as:  RISPERDAL Take 0.5 mg by mouth 2 (two) times daily.   sennosides-docusate sodium 8.6-50 MG tablet Commonly known as:  SENOKOT-S Take 2 tablets by mouth  daily.

## 2017-01-30 ENCOUNTER — Ambulatory Visit: Payer: Medicare Other | Admitting: Hematology

## 2017-01-30 ENCOUNTER — Ambulatory Visit: Payer: Medicare Other

## 2017-01-30 ENCOUNTER — Other Ambulatory Visit: Payer: Medicare Other

## 2017-02-17 ENCOUNTER — Telehealth: Payer: Self-pay | Admitting: *Deleted

## 2017-02-17 NOTE — Telephone Encounter (Signed)
Called & spoke with daughter & she reports pt is at [facility] for rehab.  She was thinking pt would start revlimid after MD/Lab visit 02/27/17.  She does have some revlimid at home-not sure how much.  Daughter will talk with pt to see how she feels about starting it while she is getting rehab & get back with [place] on Monday.

## 2017-02-20 ENCOUNTER — Other Ambulatory Visit: Payer: Self-pay | Admitting: *Deleted

## 2017-02-20 DIAGNOSIS — C9000 Multiple myeloma not having achieved remission: Secondary | ICD-10-CM

## 2017-02-20 MED ORDER — LENALIDOMIDE 15 MG PO CAPS: 15.0000 mg | ORAL_CAPSULE | Freq: Every day | ORAL | 0 refills | Status: DC

## 2017-02-24 NOTE — Progress Notes (Signed)
[facility]  [phone]) [phone] [phone]  Clinic Follow Up Note   Patient Care Team: [name], MD as PCP - General (Family Medicine) 02/27/2017  CHIEF COMPLAINTS:  Follow up Multiple Myeloma      Multiple myeloma ([place])   10/18/2016 - 10/26/2016 Hospital Admission    Pt was admitted for symptomatic hypercalcemia, AKI, bone marrow biopsy done during hospital stay, and she received Aredia       10/19/2016 Tumor Marker    SPEP showed M-protein 0.4g/dl, serum lambda light chain 5362, ratio 0.00      10/25/2016 Bone Marrow Biopsy     Overall, the marrow is hypercellular with increased lambda-restricted plasma cells (62% aspirate, 90% CD138). There is partial expression of CD20, but no monoclonal B-cell population is detected by flow cytometry.       10/25/2016 Initial Diagnosis    Multiple myeloma ([place])      10/25/2016 Miscellaneous    Cytogenetics and FISH showed loss of 13q, which is intermediate risk for MM       11/10/2016 -  Chemotherapy    Revlimid 10 mg (increased to 17m on cycle 2) daily, 3 weeks on, and one week off, dexamethasone 20 mg weekly, was off from early June to mid July 2018 due to her right hip fracture and hospitalization.       01/02/2017 Imaging     DG Hip Unilat w or w/o Pelvis IMPRESSION 1. Degenerative changes lumbar spine and both hips. No evidence of fracture or dislocation.  2. Diffuse osteopenia. Multiple tiny lucencies are noted throughout both proximal femurs. These may be secondary to the patient's known multiple myeloma.      01/18/2017 Imaging     CT hip right wo contrast  IMPRESSION: Findings consistent with a pathologic fracture of the right femoral neck. A large lytic lesion in the superior aspect of the lateral femoral head is identified.  Possible nondisplaced fracture of the right inferior pubic ramus.  Diffuse myeloma.      01/18/2017 - 01/23/2017 Hospital Admission    She present to the  ED with complaints of severe right hip pain. she recieved a right hip hemiarthroplasty with Dr. MPercell Miller      HISTORY OF PRESENTING ILLNESS (10/20/2016):  83year old African-American female, with past medical history of hypertension, chronic back pain, [name] stage III, who presents with altered mental status. She was admitted for hypercalcemia. I was called for consult to rule out multiple myeloma.   The patient is joined by family today. They help to report the patient's history. The patient has been lethargic and confused for the past week. She has issues with mobility at this time. She is constipated.   The family reports the patient fell last October, and her PCP suspected she broke her back. The patient received a cortisone injection in January, which was followed by onset of pain and decreased mobility. The patient was subsequently admitted to [name], where the family reports they discovered she was being given someone else's medicine. This led to the patient's confused mental status and difficulty with ambulation. The patient fell again while in [name], and she was brought back to home by her family. She has 24/7 care at home. In the ED, she was found to have calcium more than 15, she was admitted for further management. She received calcitonin, her calcium came down to 12.7 today. Her SPEP was positive for M protein-0.4 g/dl, possible represent a monoclonal gammopathy. Her  24-hour urine light chains are pending. Her PTH was normal at 20, PTH-rp is pending.   CURRENT THERAPY:  1. RD (Revlimid 10 mg daily, increased to 72m from cycle 3, 3 weeks on, one week off, and weekly dexamethasone 267m, started on 11/10/2016 2. Aredia 6061mvery 4 weeks, started on 10/24/2016 for hypercalcemia. Switch to Xgeva injections every 4 weeks starting 01/02/17  INTERIM HISTORY: Kristin [name] 83o. female is here for follow up accompanied by her family. She had surgery on her right  hip on 6/7 with Dr. MurPercell MillerShe has been having some pain in her left side. She has been exercising more now in therapy. She started Revilimid last week. She denies any other concerns. She denies cough or burning sensation.  MEDICAL HISTORY:  Past Medical History:  Diagnosis Date  . Allergy   . Arthritis   . Cancer ([facility] . Chronic kidney disease (CKD), stage III (moderate)    /noArchie Endo9/2018  . Depression    Risperdal "twice/day" (09/23/2016)  . Glaucoma   . Hiatal hernia   . Hypertension   . Intractable back pain    /notes 09/23/2016  . Obesity   . Osteoporosis    vertebral fracture in 2012  . Upper GI bleed 04/2004   secondary to Mallory-Weiss tear/notes 12/28/2010  . Vertigo     SURGICAL HISTORY: Past Surgical History:  Procedure Laterality Date  . ABDOMINAL HYSTERECTOMY  1974   partial  . APPENDECTOMY     as a child/notes 12/28/2010  . BACK SURGERY    . BREAST BIOPSY Left 1999   /noArchie Endo15/2012  . CATARACT EXTRACTION, BILATERAL Bilateral 2000s  . ESOPHAGOGASTRODUODENOSCOPY  04/2004   /noArchie Endo15/2012  . HIP ARTHROPLASTY Right 01/19/2017   Procedure: RIGHT HIP HEMIARTHROPLASTY;  Surgeon: MurRenette ButtersD;  [facility]  Laterality: Right;  . KYPHOPLASTY  05/2011   T10/notes 05/25/2011  . LAPAROSCOPIC CHOLECYSTECTOMY  10/2007   /noArchie Endo1/2012    SOCIAL HISTORY: Social History   Social History  . Marital status: Widowed    Spouse name: N/A  . Number of children: N/A  . Years of education: N/A   Occupational History  . Not on file.   Social History Main Topics  . Smoking status: Never Smoker  . Smokeless tobacco: Never Used  . Alcohol use Yes     Comment: 09/23/2016 "glass of wine a few times/year"  . Drug use: No  . Sexual activity: No   Other Topics Concern  . Not on file   Social History Narrative  . No narrative on file    FAMILY HISTORY: Family History  Problem Relation Age of Onset  . Hypertension Mother   .  Osteoporosis Mother   . CVA Mother   . Cancer Neg Hx     ALLERGIES:  is allergic to penicillins; alendronate; and sulfonamide derivatives.  MEDICATIONS:  Current Outpatient Prescriptions  Medication Sig Dispense Refill  . aspirin 81 MG chewable tablet Chew 81 mg by mouth daily.    . bisacodyl (DULCOLAX) 10 MG suppository Place 1 suppository (10 mg total) rectally daily as needed for moderate constipation. 12 suppository 0  . dexamethasone (DECADRON) 4 MG tablet Take 5 tablets (20 mg total) by mouth once a week. 30 tablet 1  . enoxaparin (LOVENOX) 40 MG/0.4ML injection Inject 0.4 mLs (40 mg total) into the skin daily. 21 Syringe 0  . feeding supplement, ENSURE ENLIVE, (ENSURE ENLIVE) LIQD Take 237  mLs by mouth 2 (two) times daily between meals. 237 mL 12  . lenalidomide (REVLIMID) 15 MG capsule Take 1 capsule (15 mg total) by mouth daily. Take 15 mg daily for  21  Days  ,  Off  7  Days. 21 capsule 0  . magnesium oxide (MAG-OX) 400 (241.3 Mg) MG tablet Take 1 tablet (400 mg total) by mouth daily. 7 tablet 0  . meclizine (ANTIVERT) 25 MG tablet TAKE ONE TABLET BY MOUTH THREE TIMES DAILY AS NEEDED (Patient taking differently: Take 25 mg by mouth 3 (three) times daily as needed for dizziness. TAKE ONE TABLET BY MOUTH THREE TIMES DAILY AS NEEDED) 90 tablet 0  . megestrol (MEGACE) 40 MG tablet Take 1 tablet (40 mg total) by mouth daily.    . mirtazapine (REMERON) 7.5 MG tablet TAKE 1 TABLET (7.5 MG TOTAL) BY MOUTH AT BEDTIME. 30 tablet 0  . morphine ([name]) 30 MG 12 hr tablet Take 1 tablet (30 mg total) by mouth every 12 (twelve) hours. 10 tablet 0  . ondansetron (ZOFRAN ODT) 4 MG disintegrating tablet Take 1-2 tablets (4-8 mg total) by mouth every 8 (eight) hours as needed for nausea or vomiting. 30 tablet 2  . Oxycodone HCl 10 MG TABS Take 1 tablet (10 mg total) by mouth every 6 (six) hours as needed. FOR MODERATE OR SEVERE PAIN 60 tablet 0  . polyethylene glycol (MIRALAX / GLYCOLAX) packet  Take 17 g by mouth 2 (two) times daily. 14 each 0  . RESTASIS 0.05 % ophthalmic emulsion Place 1 drop into both eyes daily.     . risperiDONE (RISPERDAL) 0.5 MG tablet Take 0.5 mg by mouth 2 (two) times daily.    . sennosides-docusate sodium (SENOKOT-S) 8.6-50 MG tablet Take 2 tablets by mouth daily.     No current facility-administered medications for this visit.     REVIEW OF SYSTEMS:  Constitutional: Denies fevers, chills or abnormal night sweats (+) decreased fatigue and increased strength (+) improved appetite Eyes: Denies blurriness of vision, double vision or watery eyes Ears, nose, mouth, throat, and face: Denies mucositis or sore throat (+) vertigo Respiratory: Denies cough, dyspnea or wheezes Cardiovascular: Denies palpitation, chest discomfort or lower extremity swelling Gastrointestinal:  Denies heartburn or change in bowel habits  Skin: Denies abnormal skin rashes Lymphatics: Denies new lymphadenopathy or easy bruising Neurological:Denies numbness, tingling or new weaknesses  Behavioral/Psych: Mood is stable, no new changes  Musculoskeletal: (+) severe right hip pain, resolving now (+)right side pain All other systems were reviewed with the patient and are negative.  PHYSICAL EXAMINATION: ECOG PERFORMANCE STATUS: 3  Vitals:   02/27/17 1425  BP: (!) 129/52  Pulse: (!) 102  Resp: 18  Temp: 99.9 F (37.7 C)   Filed Weights   02/27/17 1425  Weight: 159 lb 6.4 oz (72.3 kg)     GENERAL:alert, no distress and comfortable, sitting in wheelchair  SKIN: skin color, texture, turgor are normal, no rashes or significant lesions EYES: normal, conjunctiva are pink and non-injected, sclera clear OROPHARYNX:no exudate, no erythema and lips, buccal mucosa, and tongue normal  NECK: supple, thyroid normal size, non-tender, without nodularity LYMPH:  no palpable lymphadenopathy in the cervical, axillary or inguinal LUNGS: clear to auscultation and percussion with normal breathing  effort HEART: regular rate & rhythm and no murmurs and no lower extremity edema ABDOMEN:abdomen soft, non-tender and normal bowel sounds Musculoskeletal:no cyanosis of digits and no clubbing  PSYCH: alert & oriented x 3 with fluent speech  NEURO: no focal motor/sensory deficits  LABORATORY DATA:  I have reviewed the data as listed CBC Latest Ref Rng & Units 02/27/2017 01/23/2017 01/22/2017  WBC 3.9 - 10.3 10e3/uL 6.5 7.2 8.3  Hemoglobin 11.6 - 15.9 g/dL 10.3(L) 9.4(L) 9.1(L)  Hematocrit 34.8 - 46.6 % 31.5(L) 29.2(L) 27.8(L)  Platelets 145 - 400 10e3/uL 282 254 216   CMP Latest Ref Rng & Units 02/27/2017 01/23/2017 01/22/2017  Glucose 70 - 140 mg/dl 243(H) 112(H) 117(H)  BUN 7.0 - 26.0 mg/dL 12.7 17 17   Creatinine 0.6 - 1.1 mg/dL 0.9 0.80 0.89  Sodium 136 - 145 mEq/L 139 134(L) 137  Potassium 3.5 - 5.1 mEq/L 4.1 3.7 3.8  Chloride 101 - 111 mmol/L - 105 106  CO2 22 - 29 mEq/L 22 23 24   Calcium 8.4 - 10.4 mg/dL 8.8 6.9(L) 6.4(LL)  Total Protein 6.4 - 8.3 g/dL 5.9(L) - 4.9(L)  Total Bilirubin 0.20 - 1.20 mg/dL 0.59 - 0.9  Alkaline Phos 40 - 150 U/L 112 - 80  AST 5 - 34 U/L 11 - 24  ALT 0-55 U/L U/L <6 - 15    MM LAB: M-protein (g/dl) 10/19/2016: 0.4 11/01/2016: Not Det 12/05/16: Not Det  Serum kappa, lamda light chains and ratio (mg/L) 10/21/2016: 14.8, 5362, 0.00 11/01/2016: 1.49, 10.88, 0.14 12/05/2016: 2.16, 19.31, 0.11   24 hour urine (total protein, free lambda, ration) 10/21/2016: 1463, 1570, 0.06  PATHOLOGY: Diagnosis 01/19/2017 Femoral head, fracture, Right - FRACTURE, SEE COMMENT. Microscopic Comment There are scattered plasma cells seen with CD138, predominately in the area of fracture. Light chain in situ hybridization is non-contributory, likely due to heavy decalcification. Overt plasma cell neoplasm is not appreciated.  Interpretation 10/25/2016 Bone Marrow Flow Cytometry - NO MONOCLONAL B-CELL OR PHENOTYPICALLY ABERRANT T-CELL POPULATION IDENTIFIED.  Diagnosis  10/25/2016 Bone Marrow, Aspirate,Biopsy, and Clot - PLASMA CELL MYELOMA. - SEE COMMENT. PERIPHERAL BLOOD: - NORMOCYTIC ANEMIA. Diagnosis Note Overall, the marrow is hypercellular with increased lambda-restricted plasma cells (62% aspirate, 90% CD138). There is partial expression of CD20, but no monoclonal B-cell population is detected by flow cytometry.     RADIOGRAPHIC STUDIES: I have personally reviewed the radiological images as listed and agreed with the findings in the report. No results found.    ASSESSMENT & PLAN:  83 y.o.woman with HTN and CKD, presented with hypercalcemia, worsening anemia since 06/2016, s/p multiple fall, multiple hospitalization recently for back pain , CT scan showedT2 vertebral body compression fracture with mild height loss.  1. 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 -We previously discussed treatment options.  due to advanced age, multiple comorbidities, very limited performance status, I do not believe she is a candidate for bone marrow transplant -I previously recommend her dexamethasone and Revlimid. Start on dexamethasone 84m weekly and Revlimid 168mdaily; 3 weeks on, 1 week off, at adjusted for her renal function. She has been tolerating well, dose increased to 1523mrom cycle 3 -due to her Intermediated risk disease, she may need Velcade. Due to her advanced age and comorbilities, I am concerned about her tolerance to Velcade. I will try RD for a few cycles, if she does not have  good response, I will add Velcade.  -The goal of therapy is  palliative, for disease control, improve her quality of life and prolong life. - Repeat multiple myeloma panel every 4 weeks  - she has underwent right hip surgery with Dr. Percell Miller in June 2018, she was off Revlimid for about 6 weeks. - she has restarted Revlimid last week. We again reviewed potential side effects, especially fatigue, risk of infection, neuropathy, secondary cancer, etc.  - labs reviewed. Hgb 10.3 today. She is clinically doing well. - if she has a fever of >100.4 she knows to call us and we can prescribe antibiotics  -We'll restart Xgeva and continue every 4 weeks  2. Anemia  -secondary to MM and Revlimid. -She received blood transfusion in hospital -will monitor CBC closely -consider blood transfusion if Hb<8.0 or symptomatic anemia with Hb 8-9 -Her anemia has increased slightly improved since he came off Revlimid, hemoglobin 10.3 today  3. Hypercalcemia  -She has received one dose of Aredia, hypercalcemia has nearly resolved now. We'll continue every 4 weeks for multiple myeloma, or more frequent if her hypercalcemia gets worse. -Her calcium is back to normal now. -will start back on calcium soon (pending) if her calcium normalized after Xgeva - I encouraged her to take vitD, 800 unit daily   4. Chronic CKD, stage II -much improved, EGFR 50-60 -Avoid nephrotoxic medicine or contrast   5. Severe back pain and right hip pain from pathologic fracture -T2 compression fracture in the right hip is logical fracture secondary to her multiple myeloma -She will continue [name] every 12 hours and oxycodone as needed -we discussed taking laxative due to increased oxycodone prescription -We discussed using walker and avoid weight bearing on her right hip -Will give referral to orthopedic surgery for further evaluation   6. HTN -controlled with medication -managed by PCP  7. Osteoporosis and fractures -Takes Boniva, which has been switched to Aredia  -Due to her poor IV  access, I'll switch Aredia to Xgeva every 4 weeks -Start Xgeva 01/02/17, we'll resume today, and continue every 4 weeks   8. Goal of care discussion  -We previoulsy discussed the incurable nature of her MM, and the overall poor prognosis, especially if she does not have good response to chemotherapy or progress on chemo -The patient understands the goal of care is palliative. -I recommend DNR/DNI, she will think about it   9. Nausea - I previously recommended she take some type of nausea medication. She is hesitant, but agreed totry Zofran 4-83m. She can take up to 3x daily. -She has constipation secondary to Zofran -continue Compazine for her nausea   10. Anorexia - I have previously encouraged her to eat snacks in between her meals -I previously recommended her to try mirtazapine 7.581mHS, may increase dose in future if needed  -Remeron is working well and her appetite has improved lately.   Plan -Refill Oxycodone 1044moday and Morphine ER 45m51mxgeva injection today  and continue every 4 weeks -Continue Revlimid 15 mg daily, 3 weeks on, one-week off, she started to cycle on 02/24/2017, dexamethasone 20 mg weekly - f/u in 4 weeks   All questions were answered. The patient knows to call the clinic with any problems, questions or concerns.  I spent 20 minutes counseling the patient face to face. The total time spent in the appointment was 30 minutes and more than 50% was on counseling.  This document serves as a record of services personally performed  by Truitt Merle, MD. It was created on her behalf by Brandt Loosen, a trained medical scribe. The creation of this record is based on the scribe's personal observations and the provider's statements to them. This document has been checked and approved by the attending provider.   I have reviewed the above documentation for accuracy and completeness and I agree with the above.   Truitt Merle, MD 02/27/2017

## 2017-02-27 ENCOUNTER — Ambulatory Visit (HOSPITAL_BASED_OUTPATIENT_CLINIC_OR_DEPARTMENT_OTHER): Payer: Medicare Other

## 2017-02-27 ENCOUNTER — Telehealth: Payer: Self-pay | Admitting: Hematology

## 2017-02-27 ENCOUNTER — Other Ambulatory Visit (HOSPITAL_BASED_OUTPATIENT_CLINIC_OR_DEPARTMENT_OTHER): Payer: Medicare Other

## 2017-02-27 ENCOUNTER — Encounter: Payer: Self-pay | Admitting: Hematology

## 2017-02-27 ENCOUNTER — Ambulatory Visit (HOSPITAL_BASED_OUTPATIENT_CLINIC_OR_DEPARTMENT_OTHER): Payer: Medicare Other | Admitting: Hematology

## 2017-02-27 VITALS — BP 129/52 | HR 102 | Temp 99.9°F | Resp 18 | Ht <= 58 in | Wt 159.4 lb

## 2017-02-27 DIAGNOSIS — D6481 Anemia due to antineoplastic chemotherapy: Secondary | ICD-10-CM

## 2017-02-27 DIAGNOSIS — M84459A Pathological fracture, hip, unspecified, initial encounter for fracture: Secondary | ICD-10-CM

## 2017-02-27 DIAGNOSIS — N182 Chronic kidney disease, stage 2 (mild): Secondary | ICD-10-CM | POA: Diagnosis not present

## 2017-02-27 DIAGNOSIS — M810 Age-related osteoporosis without current pathological fracture: Secondary | ICD-10-CM | POA: Diagnosis not present

## 2017-02-27 DIAGNOSIS — C9000 Multiple myeloma not having achieved remission: Secondary | ICD-10-CM

## 2017-02-27 DIAGNOSIS — I10 Essential (primary) hypertension: Secondary | ICD-10-CM | POA: Diagnosis not present

## 2017-02-27 DIAGNOSIS — D630 Anemia in neoplastic disease: Secondary | ICD-10-CM

## 2017-02-27 DIAGNOSIS — Z7189 Other specified counseling: Secondary | ICD-10-CM

## 2017-02-27 DIAGNOSIS — M8448XA Pathological fracture, other site, initial encounter for fracture: Secondary | ICD-10-CM

## 2017-02-27 LAB — CBC WITH DIFFERENTIAL/PLATELET
BASO%: 0.2 % (ref 0.0–2.0)
BASOS ABS: 0 10*3/uL (ref 0.0–0.1)
EOS%: 0 % (ref 0.0–7.0)
Eosinophils Absolute: 0 10*3/uL (ref 0.0–0.5)
HCT: 31.5 % — ABNORMAL LOW (ref 34.8–46.6)
HGB: 10.3 g/dL — ABNORMAL LOW (ref 11.6–15.9)
LYMPH%: 7.3 % — AB (ref 14.0–49.7)
MCH: 32.4 pg (ref 25.1–34.0)
MCHC: 32.7 g/dL (ref 31.5–36.0)
MCV: 99.1 fL (ref 79.5–101.0)
MONO#: 0 10*3/uL — ABNORMAL LOW (ref 0.1–0.9)
MONO%: 0.6 % (ref 0.0–14.0)
NEUT#: 5.9 10*3/uL (ref 1.5–6.5)
NEUT%: 91.9 % — AB (ref 38.4–76.8)
Platelets: 282 10*3/uL (ref 145–400)
RBC: 3.18 10*6/uL — AB (ref 3.70–5.45)
RDW: 17.2 % — ABNORMAL HIGH (ref 11.2–14.5)
WBC: 6.5 10*3/uL (ref 3.9–10.3)
lymph#: 0.5 10*3/uL — ABNORMAL LOW (ref 0.9–3.3)

## 2017-02-27 LAB — COMPREHENSIVE METABOLIC PANEL
ALT: <6 U/L (ref 0–55)
AST: 11 U/L (ref 5–34)
Albumin: 3.2 g/dL — ABNORMAL LOW (ref 3.5–5.0)
Alkaline Phosphatase: 112 U/L (ref 40–150)
Anion Gap: 11 mEq/L (ref 3–11)
BUN: 12.7 mg/dL (ref 7.0–26.0)
CALCIUM: 8.8 mg/dL (ref 8.4–10.4)
CHLORIDE: 106 meq/L (ref 98–109)
CO2: 22 meq/L (ref 22–29)
Creatinine: 0.9 mg/dL (ref 0.6–1.1)
EGFR: 69 mL/min/{1.73_m2} — ABNORMAL LOW (ref >90)
Glucose: 243 mg/dl — ABNORMAL HIGH (ref 70–140)
POTASSIUM: 4.1 meq/L (ref 3.5–5.1)
SODIUM: 139 meq/L (ref 136–145)
Total Bilirubin: 0.59 mg/dL (ref 0.20–1.20)
Total Protein: 5.9 g/dL — ABNORMAL LOW (ref 6.4–8.3)

## 2017-02-27 MED ORDER — MORPHINE SULFATE ER 30 MG PO TBCR: 30.0000 mg | EXTENDED_RELEASE_TABLET | Freq: Two times a day (BID) | ORAL | 0 refills | Status: DC

## 2017-02-27 MED ORDER — DENOSUMAB 120 MG/1.7ML ~~LOC~~ SOLN
120.0000 mg | Freq: Once | SUBCUTANEOUS | Status: AC
  Administered 2017-02-27: 120 mg via SUBCUTANEOUS
  Filled 2017-02-27: qty 1.7

## 2017-02-27 MED ORDER — EPINEPHRINE HCL 0.1 MG/ML IJ SOLN: 0.2500 mg | Freq: Once | INTRAMUSCULAR | Status: DC | PRN

## 2017-02-27 MED ORDER — OXYCODONE HCL 10 MG PO TABS: 10.0000 mg | ORAL_TABLET | Freq: Four times a day (QID) | ORAL | 0 refills | Status: DC | PRN

## 2017-02-27 MED ORDER — DIPHENHYDRAMINE HCL 50 MG/ML IJ SOLN: 50.0000 mg | Freq: Once | INTRAMUSCULAR | Status: DC | PRN

## 2017-02-27 MED ORDER — SODIUM CHLORIDE 0.9 % IV SOLN: Freq: Once | INTRAVENOUS | Status: DC | PRN

## 2017-02-27 MED ORDER — DIPHENHYDRAMINE HCL 50 MG/ML IJ SOLN: 25.0000 mg | Freq: Once | INTRAMUSCULAR | Status: DC | PRN

## 2017-02-27 MED ORDER — ONDANSETRON 4 MG PO TBDP: 4.0000 mg | ORAL_TABLET | Freq: Three times a day (TID) | ORAL | 2 refills | Status: DC | PRN

## 2017-02-27 MED ORDER — METHYLPREDNISOLONE SODIUM SUCC 125 MG IJ SOLR: 125.0000 mg | Freq: Once | INTRAMUSCULAR | Status: DC | PRN

## 2017-02-27 MED ORDER — ALBUTEROL SULFATE (2.5 MG/3ML) 0.083% IN NEBU
2.5000 mg | INHALATION_SOLUTION | Freq: Once | RESPIRATORY_TRACT | Status: DC | PRN
  Filled 2017-02-27: qty 3

## 2017-02-27 NOTE — Telephone Encounter (Signed)
Scheduled appt per 7/16 los - Gave patient AVS and calender per los.

## 2017-02-27 NOTE — Patient Instructions (Signed)

## 2017-02-28 LAB — KAPPA/LAMBDA LIGHT CHAINS
IG KAPPA FREE LIGHT CHAIN: 14.9 mg/L (ref 3.3–19.4)
IG LAMBDA FREE LIGHT CHAIN: 206.4 mg/L — AB (ref 5.7–26.3)
Kappa/Lambda FluidC Ratio: 0.07 — ABNORMAL LOW (ref 0.26–1.65)

## 2017-03-04 LAB — MULTIPLE MYELOMA PANEL, SERUM
Albumin SerPl Elph-Mcnc: 3.2 g/dL (ref 2.9–4.4)
Albumin/Glob SerPl: 1.4 (ref 0.7–1.7)
Alpha 1: 0.3 g/dL (ref 0.0–0.4)
Alpha2 Glob SerPl Elph-Mcnc: 0.6 g/dL (ref 0.4–1.0)
B-Globulin SerPl Elph-Mcnc: 0.9 g/dL (ref 0.7–1.3)
Gamma Glob SerPl Elph-Mcnc: 0.6 g/dL (ref 0.4–1.8)
Globulin, Total: 2.4 g/dL (ref 2.2–3.9)
IgA, Qn, Serum: 119 mg/dL (ref 64–422)
IgG, Qn, Serum: 666 mg/dL — ABNORMAL LOW (ref 700–1600)
IgM, Qn, Serum: 16 mg/dL — ABNORMAL LOW (ref 26–217)
Total Protein: 5.6 g/dL — ABNORMAL LOW (ref 6.0–8.5)

## 2017-03-15 ENCOUNTER — Telehealth: Payer: Self-pay | Admitting: Hematology

## 2017-03-15 ENCOUNTER — Other Ambulatory Visit: Payer: Self-pay | Admitting: *Deleted

## 2017-03-15 DIAGNOSIS — C9000 Multiple myeloma not having achieved remission: Secondary | ICD-10-CM

## 2017-03-15 MED ORDER — LENALIDOMIDE 15 MG PO CAPS: 15.0000 mg | ORAL_CAPSULE | Freq: Every day | ORAL | 0 refills | Status: DC

## 2017-03-15 NOTE — Telephone Encounter (Signed)
sw pt daughter to confirm r/s appt to 8/10 at 130 per sch msg

## 2017-03-24 ENCOUNTER — Telehealth: Payer: Self-pay | Admitting: Hematology

## 2017-03-24 ENCOUNTER — Other Ambulatory Visit (HOSPITAL_BASED_OUTPATIENT_CLINIC_OR_DEPARTMENT_OTHER): Payer: Medicare Other

## 2017-03-24 ENCOUNTER — Ambulatory Visit (HOSPITAL_BASED_OUTPATIENT_CLINIC_OR_DEPARTMENT_OTHER): Payer: Medicare Other

## 2017-03-24 ENCOUNTER — Ambulatory Visit (HOSPITAL_BASED_OUTPATIENT_CLINIC_OR_DEPARTMENT_OTHER): Payer: Medicare Other | Admitting: Hematology

## 2017-03-24 VITALS — BP 149/60 | HR 92 | Temp 98.8°F | Resp 16 | Ht <= 58 in | Wt 152.7 lb

## 2017-03-24 DIAGNOSIS — C9000 Multiple myeloma not having achieved remission: Secondary | ICD-10-CM | POA: Diagnosis not present

## 2017-03-24 DIAGNOSIS — Z7189 Other specified counseling: Secondary | ICD-10-CM

## 2017-03-24 DIAGNOSIS — I10 Essential (primary) hypertension: Secondary | ICD-10-CM

## 2017-03-24 DIAGNOSIS — M810 Age-related osteoporosis without current pathological fracture: Secondary | ICD-10-CM

## 2017-03-24 DIAGNOSIS — N183 Chronic kidney disease, stage 3 (moderate): Secondary | ICD-10-CM | POA: Diagnosis not present

## 2017-03-24 DIAGNOSIS — M25551 Pain in right hip: Secondary | ICD-10-CM

## 2017-03-24 DIAGNOSIS — M7989 Other specified soft tissue disorders: Secondary | ICD-10-CM

## 2017-03-24 DIAGNOSIS — M8000XS Age-related osteoporosis with current pathological fracture, unspecified site, sequela: Secondary | ICD-10-CM

## 2017-03-24 DIAGNOSIS — D630 Anemia in neoplastic disease: Secondary | ICD-10-CM

## 2017-03-24 DIAGNOSIS — D6481 Anemia due to antineoplastic chemotherapy: Secondary | ICD-10-CM

## 2017-03-24 LAB — CBC WITH DIFFERENTIAL/PLATELET
BASO%: 1.1 % (ref 0.0–2.0)
BASOS ABS: 0.1 10*3/uL (ref 0.0–0.1)
EOS%: 5.8 % (ref 0.0–7.0)
Eosinophils Absolute: 0.4 10*3/uL (ref 0.0–0.5)
HEMATOCRIT: 29.2 % — AB (ref 34.8–46.6)
HEMOGLOBIN: 9.7 g/dL — AB (ref 11.6–15.9)
LYMPH#: 2 10*3/uL (ref 0.9–3.3)
LYMPH%: 30.8 % (ref 14.0–49.7)
MCH: 32.3 pg (ref 25.1–34.0)
MCHC: 33.2 g/dL (ref 31.5–36.0)
MCV: 97.3 fL (ref 79.5–101.0)
MONO#: 0.8 10*3/uL (ref 0.1–0.9)
MONO%: 12.1 % (ref 0.0–14.0)
NEUT#: 3.3 10*3/uL (ref 1.5–6.5)
NEUT%: 50.2 % (ref 38.4–76.8)
PLATELETS: 233 10*3/uL (ref 145–400)
RBC: 3 10*6/uL — ABNORMAL LOW (ref 3.70–5.45)
RDW: 15.9 % — AB (ref 11.2–14.5)
WBC: 6.6 10*3/uL (ref 3.9–10.3)

## 2017-03-24 LAB — COMPREHENSIVE METABOLIC PANEL
ALBUMIN: 3 g/dL — AB (ref 3.5–5.0)
ALK PHOS: 70 U/L (ref 40–150)
ANION GAP: 7 meq/L (ref 3–11)
AST: 8 U/L (ref 5–34)
BILIRUBIN TOTAL: 0.63 mg/dL (ref 0.20–1.20)
BUN: 12.3 mg/dL (ref 7.0–26.0)
CALCIUM: 8.2 mg/dL — AB (ref 8.4–10.4)
CHLORIDE: 110 meq/L — AB (ref 98–109)
CO2: 25 mEq/L (ref 22–29)
CREATININE: 0.9 mg/dL (ref 0.6–1.1)
EGFR: 64 mL/min/{1.73_m2} — ABNORMAL LOW (ref >90)
Glucose: 115 mg/dl (ref 70–140)
Potassium: 3.4 mEq/L — ABNORMAL LOW (ref 3.5–5.1)
Sodium: 142 mEq/L (ref 136–145)
Total Protein: 5.5 g/dL — ABNORMAL LOW (ref 6.4–8.3)

## 2017-03-24 MED ORDER — DENOSUMAB 120 MG/1.7ML ~~LOC~~ SOLN
120.0000 mg | Freq: Once | SUBCUTANEOUS | Status: AC
  Administered 2017-03-24: 120 mg via SUBCUTANEOUS
  Filled 2017-03-24: qty 1.7

## 2017-03-24 NOTE — Patient Instructions (Signed)

## 2017-03-24 NOTE — Telephone Encounter (Signed)
Scheduled appt per 8/10 los - Gave patient AVS and calender per los.

## 2017-03-24 NOTE — Progress Notes (Signed)
[place]  [phone] [phone]  Clinic Follow Up Note   Patient Care Team: [name], MD as PCP - General (Family Medicine) 03/24/2017  CHIEF COMPLAINTS:  Follow up Multiple Myeloma      Multiple myeloma ([place])   10/18/2016 - 10/26/2016 Hospital Admission    Pt was admitted for symptomatic hypercalcemia, AKI, bone marrow biopsy done during hospital stay, and she received Aredia       10/19/2016 Tumor Marker    SPEP showed M-protein 0.4g/dl, serum lambda light chain 5362, ratio 0.00      10/25/2016 Bone Marrow Biopsy     Overall, the marrow is hypercellular with increased lambda-restricted plasma cells (62% aspirate, 90% CD138). There is partial expression of CD20, but no monoclonal B-cell population is detected by flow cytometry.       10/25/2016 Initial Diagnosis    Multiple myeloma ([place])      10/25/2016 Miscellaneous    Cytogenetics and FISH showed loss of 13q, which is intermediate risk for MM       11/10/2016 -  Chemotherapy    Revlimid 10 mg (increased to 59m on cycle 2) daily, 3 weeks on, and one week off, dexamethasone 20 mg weekly, was off from early June to mid July 2018 due to her right hip fracture and hospitalization.       01/02/2017 Imaging     DG Hip Unilat w or w/o Pelvis IMPRESSION 1. Degenerative changes lumbar spine and both hips. No evidence of fracture or dislocation.  2. Diffuse osteopenia. Multiple tiny lucencies are noted throughout both proximal femurs. These may be secondary to the patient's known multiple myeloma.      01/18/2017 Imaging     CT hip right wo contrast  IMPRESSION: Findings consistent with a pathologic fracture of the right femoral neck. A large lytic lesion in the superior aspect of the lateral femoral head is identified.  Possible nondisplaced fracture of the right inferior pubic ramus.  Diffuse myeloma.      01/18/2017 - 01/23/2017 Hospital Admission    She present to the  ED with complaints of severe right hip pain. she recieved a right hip hemiarthroplasty with Dr. MPercell Miller      HISTORY OF PRESENTING ILLNESS (10/20/2016):  83year old African-American female, with past medical history of hypertension, chronic back pain, [name] stage III, who presents with altered mental status. She was admitted for hypercalcemia. I was called for consult to rule out multiple myeloma.   The patient is joined by family today. They help to report the patient's history. The patient has been lethargic and confused for the past week. She has issues with mobility at this time. She is constipated.   The family reports the patient fell last October, and her PCP suspected she broke her back. The patient received a cortisone injection in January, which was followed by onset of pain and decreased mobility. The patient was subsequently admitted to [name], where the family reports they discovered she was being given someone else's medicine. This led to the patient's confused mental status and difficulty with ambulation. The patient fell again while in [name], and she was brought back to home by her family. She has 24/7 care at home. In the ED, she was found to have calcium more than 15, she was admitted for further management. She received calcitonin, her calcium came down to 12.7 today. Her SPEP was positive for M protein-0.4 g/dl, possible represent a monoclonal gammopathy. Her  24-hour urine light chains are pending. Her PTH was normal at 20, PTH-rp is pending.   CURRENT THERAPY:  1. RD (Revlimid 10 mg daily, increased to 54m from cycle 3, 3 weeks on, one week off, and weekly dexamethasone 289m, started on 11/10/2016 2. Aredia 6082mvery 4 weeks, started on 10/24/2016 for hypercalcemia. Switch to Xgeva injections every 4 weeks on 01/02/17  INTERIM HISTORY: ChrJACOLE CAPLEY 83o. female is here for follow up accompanied by her family. She reports pain but not sever in  her right hip. The pain is much better after surgery .She walks with walker at home. She sees a PT 3 times a week at her home for walking and exercise.  She may move to a cane soon. She takes some pain medication morphine twice a day and oxycodone. She is taking them well, she is not overusing it based on the palliative care nurse. She is managing her pain well.  Her daughter will say she gets dizzy when she gets back on revlimid. She is able to tolerate overall. She denied numbness and tingling. She takes dexamethasone 5 tablets.  She will have left side pain occasionally. Her daughter says she has swelling in her lower extremity so she was put on lasix 76m76mily by Dr. WongJacelyn Gripe has noticed improvement.  She tolerated Xgeva well.    MEDICAL HISTORY:  Past Medical History:  Diagnosis Date  . Allergy   . Arthritis   . Cancer (HCC)[place]. Chronic kidney disease (CKD), stage III (moderate)    /notArchie Endo/2018  . Depression    Risperdal "twice/day" (09/23/2016)  . Glaucoma   . Hiatal hernia   . Hypertension   . Intractable back pain    /notes 09/23/2016  . Obesity   . Osteoporosis    vertebral fracture in 2012  . Upper GI bleed 04/2004   secondary to Mallory-Weiss tear/notes 12/28/2010  . Vertigo     SURGICAL HISTORY: Past Surgical History:  Procedure Laterality Date  . ABDOMINAL HYSTERECTOMY  1974   partial  . APPENDECTOMY     as a child/notes 12/28/2010  . BACK SURGERY    . BREAST BIOPSY Left 1999   /notArchie Endo5/2012  . CATARACT EXTRACTION, BILATERAL Bilateral 2000s  . ESOPHAGOGASTRODUODENOSCOPY  04/2004   /notArchie Endo5/2012  . HIP ARTHROPLASTY Right 01/19/2017   Procedure: RIGHT HIP HEMIARTHROPLASTY;  Surgeon: MurpRenette Butters;  [facility]ics;  Laterality: Right;  . KYPHOPLASTY  05/2011   T10/notes 05/25/2011  . LAPAROSCOPIC CHOLECYSTECTOMY  10/2007   /notArchie Endo/2012    SOCIAL HISTORY: Social History   Social History  . Marital status: Widowed     Spouse name: N/A  . Number of children: N/A  . Years of education: N/A   Occupational History  . Not on file.   Social History Main Topics  . Smoking status: Never Smoker  . Smokeless tobacco: Never Used  . Alcohol use Yes     Comment: 09/23/2016 "glass of wine a few times/year"  . Drug use: No  . Sexual activity: No   Other Topics Concern  . Not on file   Social History Narrative  . No narrative on file    FAMILY HISTORY: Family History  Problem Relation Age of Onset  . Hypertension Mother   . Osteoporosis Mother   . CVA Mother   . Cancer Neg Hx     ALLERGIES:  is allergic to penicillins; alendronate; and sulfonamide  derivatives.  MEDICATIONS:  Current Outpatient Prescriptions  Medication Sig Dispense Refill  . aspirin 81 MG chewable tablet Chew 81 mg by mouth daily.    . cefdinir (OMNICEF) 300 MG capsule TAKE 1 CAPSULE BY MOUTH EVERY 12 HOURS FOR 10 DAYS  0  . dexamethasone (DECADRON) 4 MG tablet Take 5 tablets (20 mg total) by mouth once a week. 30 tablet 1  . feeding supplement, ENSURE ENLIVE, (ENSURE ENLIVE) LIQD Take 237 mLs by mouth 2 (two) times daily between meals. 237 mL 12  . furosemide (LASIX) 20 MG tablet Take 20 mg by mouth daily.  1  . lenalidomide (REVLIMID) 15 MG capsule Take 1 capsule (15 mg total) by mouth daily. Take 15 mg daily for  21  Days  ,  Off  7  Days. 21 capsule 0  . magnesium oxide (MAG-OX) 400 (241.3 Mg) MG tablet Take 1 tablet (400 mg total) by mouth daily. 7 tablet 0  . megestrol (MEGACE) 40 MG tablet Take 1 tablet (40 mg total) by mouth daily.    . mirtazapine (REMERON) 7.5 MG tablet TAKE 1 TABLET (7.5 MG TOTAL) BY MOUTH AT BEDTIME. 30 tablet 0  . morphine ([name]) 30 MG 12 hr tablet Take 1 tablet (30 mg total) by mouth every 12 (twelve) hours. 10 tablet 0  . Oxycodone HCl 10 MG TABS Take 1 tablet (10 mg total) by mouth every 6 (six) hours as needed. FOR MODERATE OR SEVERE PAIN 60 tablet 0  . phenazopyridine (PYRIDIUM) 200 MG tablet  TAKE 1 TABLET 3 TIMES A DAY AFTER MEALS  0  . RESTASIS 0.05 % ophthalmic emulsion Place 1 drop into both eyes daily.     . risperiDONE (RISPERDAL) 0.5 MG tablet Take 0.5 mg by mouth 2 (two) times daily.    . sennosides-docusate sodium (SENOKOT-S) 8.6-50 MG tablet Take 2 tablets by mouth daily.    Marland Kitchen amLODipine (NORVASC) 5 MG tablet Take 5 mg by mouth daily.  3  . bisacodyl (DULCOLAX) 10 MG suppository Place 1 suppository (10 mg total) rectally daily as needed for moderate constipation. (Patient not taking: Reported on 03/24/2017) 12 suppository 0  . ezetimibe (ZETIA) 10 MG tablet Take 10 mg by mouth daily.  3  . losartan-hydrochlorothiazide (HYZAAR) 50-12.5 MG tablet Take 1 tablet by mouth daily.  3  . meclizine (ANTIVERT) 25 MG tablet TAKE ONE TABLET BY MOUTH THREE TIMES DAILY AS NEEDED (Patient not taking: Reported on 03/24/2017) 90 tablet 0  . ondansetron (ZOFRAN ODT) 4 MG disintegrating tablet Take 1-2 tablets (4-8 mg total) by mouth every 8 (eight) hours as needed for nausea or vomiting. (Patient not taking: Reported on 03/24/2017) 30 tablet 2  . polyethylene glycol (MIRALAX / GLYCOLAX) packet Take 17 g by mouth 2 (two) times daily. (Patient not taking: Reported on 03/24/2017) 14 each 0   No current facility-administered medications for this visit.     REVIEW OF SYSTEMS:  Constitutional: Denies fevers, chills or abnormal night sweats (+) decreased fatigue and increased strength (+) improved appetite Eyes: Denies blurriness of vision, double vision or watery eyes Ears, nose, mouth, throat, and face: Denies mucositis or sore throat (+) vertigo Respiratory: Denies cough, dyspnea or wheezes Cardiovascular: Denies palpitation, chest discomfort (+) lower extremity swelling Gastrointestinal:  Denies heartburn or change in bowel habits  Skin: Denies abnormal skin rashes Lymphatics: Denies new lymphadenopathy or easy bruising Neurological:Denies numbness, tingling or new weaknesses  Behavioral/Psych:  Mood is stable, no new changes  Musculoskeletal: (+)  improved right hip pain (+) left side pain All other systems were reviewed with the patient and are negative.  PHYSICAL EXAMINATION: ECOG PERFORMANCE STATUS: 3  Vitals:   03/24/17 1426  BP: (!) 149/60  Pulse: 92  Resp: 16  Temp: 98.8 F (37.1 C)  SpO2: 100%   Filed Weights   03/24/17 1426  Weight: 152 lb 11.2 oz (69.3 kg)     GENERAL:alert, no distress and comfortable, sitting in wheelchair  SKIN: skin color, texture, turgor are normal, no rashes or significant lesions EYES: normal, conjunctiva are pink and non-injected, sclera clear OROPHARYNX:no exudate, no erythema and lips, buccal mucosa, and tongue normal  NECK: supple, thyroid normal size, non-tender, without nodularity LYMPH:  no palpable lymphadenopathy in the cervical, axillary or inguinal LUNGS: clear to auscultation and percussion with normal breathing effort HEART: regular rate & rhythm and no murmurs and no lower extremity edema ABDOMEN:abdomen soft, non-tender and normal bowel sounds Musculoskeletal:no cyanosis of digits and no clubbing  PSYCH: alert & oriented x 3 with fluent speech NEURO: no focal motor/sensory deficits  LABORATORY DATA:  I have reviewed the data as listed CBC Latest Ref Rng & Units 03/24/2017 02/27/2017 01/23/2017  WBC 3.9 - 10.3 10e3/uL 6.6 6.5 7.2  Hemoglobin 11.6 - 15.9 g/dL 9.7(L) 10.3(L) 9.4(L)  Hematocrit 34.8 - 46.6 % 29.2(L) 31.5(L) 29.2(L)  Platelets 145 - 400 10e3/uL 233 282 254   CMP Latest Ref Rng & Units 03/24/2017 02/27/2017 02/27/2017  Glucose 70 - 140 mg/dl 115 243(H) -  BUN 7.0 - 26.0 mg/dL 12.3 12.7 -  Creatinine 0.6 - 1.1 mg/dL 0.9 0.9 -  Sodium 136 - 145 mEq/L 142 139 -  Potassium 3.5 - 5.1 mEq/L 3.4(L) 4.1 -  Chloride 101 - 111 mmol/L - - -  CO2 22 - 29 mEq/L 25 22 -  Calcium 8.4 - 10.4 mg/dL 8.2(L) 8.8 -  Total Protein 6.4 - 8.3 g/dL 5.5(L) 5.9(L) 5.6(L)  Total Bilirubin 0.20 - 1.20 mg/dL 0.63 0.59 -  Alkaline  Phos 40 - 150 U/L 70 112 -  AST 5 - 34 U/L 8 11 -  ALT 0-55 U/L U/L <6 <6 -    MM LAB: M-protein (g/dl) 10/19/2016: 0.4 11/01/2016: Not Det 12/05/16: Not Det 01/02/17: Not det 02/27/17: Not det 03/24/17: PENDING    Serum kappa, lamda light chains and ratio (mg/L) 10/21/2016: 14.8, 5362, 0.00 11/01/2016: 1.49, 10.88, 0.14 12/05/2016: 2.16, 19.31, 0.11 01/02/17: 3.01, 10.90, 0.28 02/27/17: 1.49, 20.64, 0.07 03/24/17: PENDING   24 hour urine (total protein, free lambda, ration) 10/21/2016: 1463, 1570, 0.06   PATHOLOGY  Diagnosis 01/19/2017 Femoral head, fracture, Right - FRACTURE, SEE COMMENT. Microscopic Comment There are scattered plasma cells seen with CD138, predominately in the area of fracture. Light chain in situ hybridization is non-contributory, likely due to heavy decalcification. Overt plasma cell neoplasm is not appreciated.  Interpretation 10/25/2016 Bone Marrow Flow Cytometry - NO MONOCLONAL B-CELL OR PHENOTYPICALLY ABERRANT T-CELL POPULATION IDENTIFIED.  Diagnosis 10/25/2016 Bone Marrow, Aspirate,Biopsy, and Clot - PLASMA CELL MYELOMA. - SEE COMMENT. PERIPHERAL BLOOD: - NORMOCYTIC ANEMIA. Diagnosis Note Overall, the marrow is hypercellular with increased lambda-restricted plasma cells (62% aspirate, 90% CD138). There is partial expression of CD20, but no monoclonal B-cell population is detected by flow cytometry.     RADIOGRAPHIC STUDIES: I have personally reviewed the radiological images as listed and agreed with the findings in the report. No results found.   DG Hip Unilateral 01/02/17 IMPRESSION: 1. Degenerative changes lumbar spine and both hips.  No evidence of fracture or dislocation. 2. Diffuse osteopenia. Multiple tiny lucencies are noted throughout both proximal femurs. These may be secondary to the patient's known multiple myeloma.   ASSESSMENT & PLAN:  83 y.o.woman with HTN and CKD, presented with hypercalcemia, worsening anemia since 06/2016,  s/p multiple fall, multiple hospitalization recently for back pain , CT scan showedT2 vertebral body compression fracture with mild height loss.  1. 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 -We previously discussed treatment options.  due to advanced age, multiple comorbidities, very limited performance status, I do not believe she is a candidate for bone marrow transplant -I previously recommend her dexamethasone and Revlimid. Start on dexamethasone 43m weekly and Revlimid 130mdaily; 3 weeks on, 1 week off, at adjusted for her renal function. She has been tolerating well, dose increased to 1573mrom cycle 3 -due to her Intermediated risk disease, she may need Velcade. Due to her advanced age and comorbilities, I am concerned about her tolerance to Velcade. I will try RD for a few cycles, if she does not have good response, I will add Velcade.  -The goal of therapy is palliative, for disease control, improve her quality of life and prolong life. - Repeat multiple myeloma panel every 4 weeks  - she has underwent right hip surgery with Dr. MurPercell Miller June 2018, she was off Revlimid for about 6 weeks. - she has restarted Revlimid, tolerating well.  - if she has a fever of >100.4 she knows to call us [place] we can prescribe antibiotics  -continue Xgeva every 4 weeks -Labs reviewed and her counts are overall stable. Her potassium is slightly low today. I suggest she eat more K-riched food  -Her M-protein is negative and she is responding well to treatment. Will continue revlimid.  -Continue  weekly dexamethasone 20 mg when he is on Revlimid.   2. Anemia  -secondary to MM and Revlimid. -She received blood transfusion in hospital -will monitor CBC closely -consider blood transfusion if Hb<8.0 or symptomatic anemia with Hb 8-9 -Her anemia has slightly improved since he came off Revlimid, hemoglobin 10.3 (02/27/17) -Anemia is 9.7 today (04/01/09), does not need blood  transfusion. We will continue to monitor  3. Hypercalcemia  -She has received one dose of Aredia, hypercalcemia has nearly resolved now. We'll continue every 4 weeks for multiple myeloma, or more frequent if her hypercalcemia gets worse. -Her calcium is back to normal now. -will start back on calcium soon (pending) if her calcium normalized after XgeDelton SeeI encouraged her to take vitD, 800 unit daily  -Her Calcium is now 8.2 so I suggest she takes some Vitamin D with Calcium once a day.   4. Chronic CKD, stage II -much improved, EGFR 50-60 -Avoid nephrotoxic medicine or contrast   5. Severe back pain and right hip pain from pathologic fracture -T2 compression fracture in the right hip is logical fracture secondary to her multiple myeloma -She will continue [name] every 12 hours and oxycodone as needed -we discussed taking laxative due to increased oxycodone prescription -We discussed using walker and avoid weight bearing on her right hip -Will give referral to  orthopedic surgery for further evaluation  -Her pain is well managed now with morphine and oxycodone.  6. HTN -controlled with medication -managed by PCP  7. Osteoporosis and fractures -Takes Boniva, which has been switched to Aredia  -Due to her poor IV access, I'll switch Aredia to Xgeva every 4 weeks -Start Xgeva 01/02/17, and continue every 4 weeks, tolerating well  8. Goal of care discussion  -We previoulsy discussed the incurable nature of her MM, and the overall poor prognosis, especially if she does not have good response to chemotherapy or  progress on chemo -The patient understands the goal of care is palliative. -I recommend DNR/DNI, she will think about it   9. Nausea - I previously recommended she take some type of nausea medication. She is hesitant, but agreed totry Zofran 4-93m. She can take up to 3x daily. -She has constipation secondary to Zofran -continue Compazine for her nausea   10. Anorexia - I have previously encouraged her to eat snacks in between her meals -I previously recommended her to try mirtazapine 7.570mHS, may increase dose in future if needed  -Remeron is working well and her appetite has improved lately.   11. Swelling in lower extremity -Prescribed Lasix by Dr. WoJacelyn GripI discussed that she can take it as needed to not significantly effect her kidney function, potassium and calcium levels.  -I recommend her to elevate her feet and wears compression socks to help.   Plan - xgeva injection today and continue every 4 weeks -Continue Revlimid 15 mg daily, 3 weeks on, one-week off, she started current cycle on 8/8, dexamethasone 20 mg weekly -Lab, f/u and xgeva injection in 4 weeks   All questions were answered. The patient knows to call the clinic with any problems, questions or concerns.  I spent 20 minutes counseling the patient face to face. The total time spent in the appointment was 30 minutes and more than 50% was on counseling.  This document serves as a record of services personally performed by YaTruitt MerleMD. It was created on her behalf by AmJoslyn Devona trained medical scribe. The creation of this record is based on the scribe's personal observations and the provider's statements to them. This document has been checked and approved by the attending provider.    I have reviewed the above documentation for accuracy and completeness and I agree with the above.   FeTruitt MerleMD 03/24/2017

## 2017-03-25 ENCOUNTER — Encounter: Payer: Self-pay | Admitting: Hematology

## 2017-03-27 ENCOUNTER — Other Ambulatory Visit

## 2017-03-27 ENCOUNTER — Ambulatory Visit: Admitting: Hematology

## 2017-03-27 ENCOUNTER — Ambulatory Visit

## 2017-03-27 LAB — MULTIPLE MYELOMA PANEL, SERUM
ALBUMIN/GLOB SERPL: 1.4 (ref 0.7–1.7)
ALPHA2 GLOB SERPL ELPH-MCNC: 0.6 g/dL (ref 0.4–1.0)
Albumin SerPl Elph-Mcnc: 3 g/dL (ref 2.9–4.4)
Alpha 1: 0.2 g/dL (ref 0.0–0.4)
B-GLOBULIN SERPL ELPH-MCNC: 0.7 g/dL (ref 0.7–1.3)
GAMMA GLOB SERPL ELPH-MCNC: 0.7 g/dL (ref 0.4–1.8)
GLOBULIN, TOTAL: 2.2 g/dL (ref 2.2–3.9)
IGG (IMMUNOGLOBIN G), SERUM: 701 mg/dL (ref 700–1600)
IgA, Qn, Serum: 148 mg/dL (ref 64–422)
IgM, Qn, Serum: 18 mg/dL — ABNORMAL LOW (ref 26–217)
Total Protein: 5.2 g/dL — ABNORMAL LOW (ref 6.0–8.5)

## 2017-03-27 LAB — KAPPA/LAMBDA LIGHT CHAINS
IG KAPPA FREE LIGHT CHAIN: 21.9 mg/L — AB (ref 3.3–19.4)
Ig Lambda Free Light Chain: 39 mg/L — ABNORMAL HIGH (ref 5.7–26.3)
KAPPA/LAMBDA FLC RATIO: 0.56 (ref 0.26–1.65)

## 2017-03-31 ENCOUNTER — Other Ambulatory Visit: Payer: Self-pay | Admitting: *Deleted

## 2017-03-31 ENCOUNTER — Telehealth: Payer: Self-pay

## 2017-03-31 DIAGNOSIS — C9000 Multiple myeloma not having achieved remission: Secondary | ICD-10-CM

## 2017-03-31 MED ORDER — OXYCODONE HCL 10 MG PO TABS: 10.0000 mg | ORAL_TABLET | Freq: Four times a day (QID) | ORAL | 0 refills | Status: DC | PRN

## 2017-03-31 MED ORDER — MORPHINE SULFATE ER 30 MG PO TBCR: 30.0000 mg | EXTENDED_RELEASE_TABLET | Freq: Two times a day (BID) | ORAL | 0 refills | Status: DC

## 2017-03-31 NOTE — Telephone Encounter (Signed)
Family called for oxycodone and morphine refill.

## 2017-04-04 ENCOUNTER — Telehealth: Payer: Self-pay

## 2017-04-04 DIAGNOSIS — C9000 Multiple myeloma not having achieved remission: Secondary | ICD-10-CM

## 2017-04-04 NOTE — Telephone Encounter (Signed)
OK to refill for one month if she is doing well with MS-contin, thanks   Truitt Merle MD

## 2017-04-04 NOTE — Telephone Encounter (Signed)
[name] is calling for ms contin refill. Last refill was for quantity of 10. She is asking for larger quantity rather than 5 day supply. Next ov 04/24/17.

## 2017-04-05 MED ORDER — MORPHINE SULFATE ER 30 MG PO TBCR: 30.0000 mg | EXTENDED_RELEASE_TABLET | Freq: Two times a day (BID) | ORAL | 0 refills | Status: DC

## 2017-04-05 NOTE — Telephone Encounter (Signed)
S/w Lucita Ferrara and she is doing well with the [name]. rx prepared for 30 day supply for signature. Instructed her to pick up by 4 pm.

## 2017-04-05 NOTE — Addendum Note (Signed)
Addended by: Janace Hoard on: 04/05/2017 12:50 PM   Modules accepted: Orders

## 2017-04-12 ENCOUNTER — Other Ambulatory Visit: Payer: Self-pay | Admitting: *Deleted

## 2017-04-12 DIAGNOSIS — C9000 Multiple myeloma not having achieved remission: Secondary | ICD-10-CM

## 2017-04-12 MED ORDER — LENALIDOMIDE 15 MG PO CAPS: 15.0000 mg | ORAL_CAPSULE | Freq: Every day | ORAL | 0 refills | Status: DC

## 2017-04-12 MED ORDER — DEXAMETHASONE 4 MG PO TABS: 20.0000 mg | ORAL_TABLET | ORAL | 1 refills | Status: DC

## 2017-04-13 ENCOUNTER — Other Ambulatory Visit: Payer: Self-pay | Admitting: Hematology

## 2017-04-13 DIAGNOSIS — C9000 Multiple myeloma not having achieved remission: Secondary | ICD-10-CM

## 2017-04-14 ENCOUNTER — Other Ambulatory Visit: Payer: Self-pay | Admitting: Hematology

## 2017-04-14 DIAGNOSIS — C9000 Multiple myeloma not having achieved remission: Secondary | ICD-10-CM

## 2017-04-20 ENCOUNTER — Telehealth: Payer: Self-pay | Admitting: *Deleted

## 2017-04-20 NOTE — Telephone Encounter (Signed)
Called & spoke with pt's daughter, Lucita Ferrara since we received a message from [facility] stating that daughter had called last eve frustrated b/c she has been trying to get a refill on decadron for 2 wks now.  Lucita Ferrara is upset because script should have been called to [facility] [place].  Explained that pt had 5 pharmacies listed in chart,  deleted [facility] per her request.  Requested that she verify pharmacy with any refills.  She is still frustrated but accepted my apology.  She states that she came by office today & found out script went to [facility] has picked it up now so no need to send [facility] to [facility] at this time.  She states that the nurse that sent in script was incompetent.  There was nothing I could say to make her feel any different.  Informed to call & speak to nurse any time there is a problem & we will try to take care of it. Message routed to Dr Burr Medico.

## 2017-04-20 NOTE — Progress Notes (Signed)
[facility]  [phone] [phone]) [phone]  Clinic Follow Up Note   Patient Care Team: Kristin Shanks, MD as PCP - General (Family Medicine) 04/24/2017  CHIEF COMPLAINTS:  Follow up Multiple Myeloma      Multiple myeloma ([place])   10/18/2016 - 10/26/2016 Hospital Admission    Pt was admitted for symptomatic hypercalcemia, AKI, bone marrow biopsy done during hospital stay, and she received Aredia       10/19/2016 Tumor Marker    SPEP showed M-protein 0.4g/dl, serum lambda light chain 5362, ratio 0.00      10/25/2016 Bone Marrow Biopsy     Overall, the marrow is hypercellular with increased lambda-restricted plasma cells (62% aspirate, 90% CD138). There is partial expression of CD20, but no monoclonal B-cell population is detected by flow cytometry.       10/25/2016 Initial Diagnosis    Multiple myeloma ([place])      10/25/2016 Miscellaneous    Cytogenetics and FISH showed loss of 13q, which is intermediate risk for MM       11/10/2016 -  Chemotherapy    Revlimid 10 mg (increased to 10m on cycle 2) daily, 3 weeks on, and one week off, dexamethasone 20 mg weekly, was off from early June to mid July 2018 due to her right hip fracture and hospitalization.       01/02/2017 Imaging     DG Hip Unilat w or w/o Pelvis IMPRESSION 1. Degenerative changes lumbar spine and both hips. No evidence of fracture or dislocation.  2. Diffuse osteopenia. Multiple tiny lucencies are noted throughout both proximal femurs. These may be secondary to the patient's known multiple myeloma.      01/18/2017 Imaging     CT hip right wo contrast  IMPRESSION: Findings consistent with a pathologic fracture of the right femoral neck. A large lytic lesion in the superior aspect of the lateral femoral head is identified.  Possible nondisplaced fracture of the right inferior pubic ramus.  Diffuse myeloma.      01/18/2017 - 01/23/2017 Hospital Admission    She present to the  ED with complaints of severe right hip pain. she recieved a right hip hemiarthroplasty with Dr. MPercell Miller      HISTORY OF PRESENTING ILLNESS (10/20/2016):  83year old African-American female, with past medical history of hypertension, chronic back pain, see Kristin [name] stage III, who presents with altered mental status. She was admitted for hypercalcemia. I was called for consult to rule out multiple myeloma.   The patient is joined by family today. They help to report the patient's history. The patient has been lethargic and confused for the past week. She has issues with mobility at this time. She is constipated.   The family reports the patient fell last October, and her PCP suspected she broke her back. The patient received a cortisone injection in January, which was followed by onset of pain and decreased mobility. The patient was subsequently admitted [name], where the family reports they discovered she was being given someone else's medicine. This led to the patient's confused mental status and difficulty with ambulation. The patient fell again while in [name], and she was brought back to home by her family. She has 24/7 care at home. In the ED, she was found to have calcium more than 15, she was admitted for further management. She received calcitonin, her calcium came down to 12.7 today. Her SPEP was positive for M protein-0.4 g/dl, possible represent a monoclonal gammopathy. Her  24-hour urine light chains are pending. Her PTH was normal at 20, PTH-rp is pending.   CURRENT THERAPY:  1. RD (Revlimid 10 mg daily, increased to 3m from cycle 3, 3 weeks on, one week off, and weekly dexamethasone 224m, started on 11/10/2016 2. Aredia 6057mvery 4 weeks, started on 10/24/2016 for hypercalcemia. Switch to Xgeva injections every 4 weeks on 01/02/17  INTERIM HISTORY: Kristin [name] 83o. female is here for follow up accompanied by her daughter. She reports her pain is on and  off. Her daughter says the pain is in her back and on her side. The back pain got better with pain medication. Her right upper arm is swollen since last week. This area is sore for her.  She fell at nursing home and bruised her right hip and fractured her right collar bone. The fall was quite a while ago. She mostly sleep on her right side. She will try to sleep on her back or left side with pillow to elevate her right arm. She is sleeping better with mirtazapine.   She reports her appetite has gotten much better. She still does PT at her residence.    MEDICAL HISTORY:  Past Medical History:  Diagnosis Date  . Allergy   . Arthritis   . [place] ([facility] . Chronic kidney disease (CKD), stage III (moderate)    /noArchie Endo9/2018  . Depression    Risperdal "twice/day" (09/23/2016)  . Glaucoma   . Hiatal hernia   . Hypertension   . Intractable back pain    /notes 09/23/2016  . Obesity   . Osteoporosis    vertebral fracture in 2012  . Upper GI bleed 04/2004   secondary to Mallory-Weiss tear/notes 12/28/2010  . Vertigo     SURGICAL HISTORY: Past Surgical History:  Procedure Laterality Date  . ABDOMINAL HYSTERECTOMY  1974   partial  . APPENDECTOMY     as a child/notes 12/28/2010  . BACK SURGERY    . BREAST BIOPSY Left 1999   /noArchie Endo15/2012  . CATARACT EXTRACTION, BILATERAL Bilateral 2000s  . ESOPHAGOGASTRODUODENOSCOPY  04/2004   /noArchie Endo15/2012  . HIP ARTHROPLASTY Right 01/19/2017   Procedure: RIGHT HIP HEMIARTHROPLASTY;  Surgeon: MurRenette ButtersD;  [facility]  Laterality: Right;  . KYPHOPLASTY  05/2011   T10/notes 05/25/2011  . LAPAROSCOPIC CHOLECYSTECTOMY  10/2007   /noArchie Endo1/2012    SOCIAL HISTORY: Social History   Social History  . Marital status: Widowed    Spouse name: N/A  . Number of children: N/A  . Years of education: N/A   Occupational History  . Not on file.   Social History Main Topics  . Smoking status: Never Smoker  .  Smokeless tobacco: Never Used  . Alcohol use Yes     Comment: 09/23/2016 "glass of wine a few times/year"  . Drug use: No  . Sexual activity: No   Other Topics Concern  . Not on file   Social History Narrative  . No narrative on file    FAMILY HISTORY: Family History  Problem Relation Age of Onset  . Hypertension Mother   . Osteoporosis Mother   . CVA Mother   . Cancer Neg Hx     ALLERGIES:  is allergic to penicillins; alendronate; and sulfonamide derivatives.  MEDICATIONS:  Current Outpatient Prescriptions  Medication Sig Dispense Refill  . aspirin 81 MG chewable tablet Chew 81 mg by mouth daily.    . bisacodyl (DULCOLAX)  10 MG suppository Place 1 suppository (10 mg total) rectally daily as needed for moderate constipation. 12 suppository 0  . cefdinir (OMNICEF) 300 MG capsule TAKE 1 CAPSULE BY MOUTH EVERY 12 HOURS FOR 10 DAYS  0  . dexamethasone (DECADRON) 4 MG tablet Take 5 tablets (20 mg total) by mouth once a week. 30 tablet 1  . ezetimibe (ZETIA) 10 MG tablet Take 10 mg by mouth daily.  3  . feeding supplement, ENSURE ENLIVE, (ENSURE ENLIVE) LIQD Take 237 mLs by mouth 2 (two) times daily between meals. 237 mL 12  . furosemide (LASIX) 20 MG tablet Take 20 mg by mouth daily.  1  . lenalidomide (REVLIMID) 15 MG capsule Take 1 capsule (15 mg total) by mouth daily. Take 15 mg daily for  21  Days  ,  Off  7  Days. 21 capsule 0  . magnesium oxide (MAG-OX) 400 (241.3 Mg) MG tablet Take 1 tablet (400 mg total) by mouth daily. 7 tablet 0  . meclizine (ANTIVERT) 25 MG tablet TAKE ONE TABLET BY MOUTH THREE TIMES DAILY AS NEEDED 90 tablet 0  . megestrol (MEGACE) 40 MG tablet Take 1 tablet (40 mg total) by mouth daily.    Marland Kitchen morphine ([name]) 30 MG 12 hr tablet Take 1 tablet (30 mg total) by mouth every 12 (twelve) hours. 60 tablet 0  . ondansetron (ZOFRAN ODT) 4 MG disintegrating tablet Take 1-2 tablets (4-8 mg total) by mouth every 8 (eight) hours as needed for nausea or vomiting. 30  tablet 2  . Oxycodone HCl 10 MG TABS Take 1 tablet (10 mg total) by mouth every 6 (six) hours as needed. FOR MODERATE OR SEVERE PAIN 60 tablet 0  . phenazopyridine (PYRIDIUM) 200 MG tablet TAKE 1 TABLET 3 TIMES A DAY AFTER MEALS  0  . polyethylene glycol (MIRALAX / GLYCOLAX) packet Take 17 g by mouth 2 (two) times daily. 14 each 0  . RESTASIS 0.05 % ophthalmic emulsion Place 1 drop into both eyes daily.     . risperiDONE (RISPERDAL) 0.5 MG tablet Take 0.5 mg by mouth 2 (two) times daily.    . sennosides-docusate sodium (SENOKOT-S) 8.6-50 MG tablet Take 2 tablets by mouth daily.    . mirtazapine (REMERON) 15 MG tablet Take 1 tablet (15 mg total) by mouth at bedtime. 30 tablet 2   No current facility-administered medications for this visit.     REVIEW OF SYSTEMS:  Constitutional: Denies fevers, chills or abnormal night sweats (+) decreased fatigue and increased strength (+) improved appetite Eyes: Denies blurriness of vision, double vision or watery eyes Ears, nose, mouth, throat, and face: Denies mucositis or sore throat (+) vertigo Respiratory: Denies cough, dyspnea or wheezes Cardiovascular: Denies palpitation, chest discomfort (+) lower extremity swelling (+) right arm swelling  Gastrointestinal:  Denies heartburn or change in bowel habits  Skin: Denies abnormal skin rashes Lymphatics: Denies new lymphadenopathy or easy bruising  Neurological:Denies numbness, tingling or new weaknesses  Behavioral/Psych: Mood is stable, no new changes  Musculoskeletal: (+) improved right hip pain (+) left side pain All other systems were reviewed with the patient and are negative.  PHYSICAL EXAMINATION: ECOG PERFORMANCE STATUS: 3  Vitals:   04/24/17 1417  BP: 131/60  Pulse: 84  Resp: 18  Temp: 98.4 F (36.9 C)  SpO2: 94%   Filed Weights   04/24/17 1417  Weight: 148 lb 4.8 oz (67.3 kg)     GENERAL:alert, no distress and comfortable, sitting in wheelchair  SKIN:  skin color, texture, turgor  are normal, no rashes or significant lesions EYES: normal, conjunctiva are pink and non-injected, sclera clear OROPHARYNX:no exudate, no erythema and lips, buccal mucosa, and tongue normal  NECK: supple, thyroid normal size, non-tender, without nodularity LYMPH:  no palpable lymphadenopathy in the cervical, axillary or inguinal LUNGS: clear to auscultation and percussion with normal breathing effort HEART: regular rate & rhythm and no murmurs and no lower extremity edema ABDOMEN:abdomen soft, non-tender and normal bowel sounds Musculoskeletal:no cyanosis of digits and no clubbing  PSYCH: alert & oriented x 3 with fluent speech NEURO: no focal motor/sensory deficits  LABORATORY DATA:  I have reviewed the data as listed CBC Latest Ref Rng & Units 04/24/2017 03/24/2017 02/27/2017  WBC 3.9 - 10.3 10e3/uL 2.9(L) 6.6 6.5  Hemoglobin 11.6 - 15.9 g/dL 9.4(L) 9.7(L) 10.3(L)  Hematocrit 34.8 - 46.6 % 28.2(L) 29.2(L) 31.5(L)  Platelets 145 - 400 10e3/uL 199 233 282   CMP Latest Ref Rng & Units 04/24/2017 03/24/2017 03/24/2017  Glucose 70 - 140 mg/dl 161(H) 115 -  BUN 7.0 - 26.0 mg/dL 11.4 12.3 -  Creatinine 0.6 - 1.1 mg/dL 1.1 0.9 -  Sodium 136 - 145 mEq/L 138 142 -  Potassium 3.5 - 5.1 mEq/L 4.3 3.4(L) -  Chloride 101 - 111 mmol/L - - -  CO2 22 - 29 mEq/L 23 25 -  Calcium 8.4 - 10.4 mg/dL 8.0(L) 8.2(L) -  Total Protein 6.4 - 8.3 g/dL 6.1(L) 5.5(L) 5.2(L)  Total Bilirubin 0.20 - 1.20 mg/dL 0.55 0.63 -  Alkaline Phos 40 - 150 U/L 68 70 -  AST 5 - 34 U/L 11 8 -  ALT 0-55 U/L U/L <6 <6 -    MM LAB: M-protein (g/dl) 10/19/2016: 0.4 11/01/2016: Not Det 12/05/16: Not Det 01/02/17: Not det 02/27/17: Not det 03/24/17: not det.  04/24/17: PENDING    Serum kappa, lamda light chains and ratio (mg/L) 10/21/2016: 14.8, 5362, 0.00 11/01/2016: 1.49, 10.88, 0.14 12/05/2016: 2.16, 19.31, 0.11 01/02/17: 3.01, 10.90, 0.28 02/27/17: 1.49, 20.64, 0.07 03/24/17: 2.19, 3.90, 0.56 04/24/17: PENDING  24 hour urine  (total protein, free lambda, ration) 10/21/2016: 1463, 1570, 0.06   PATHOLOGY  Diagnosis 01/19/2017 Femoral head, fracture, Right - FRACTURE, SEE COMMENT. Microscopic Comment There are scattered plasma cells seen with CD138, predominately in the area of fracture. Light chain in situ hybridization is non-contributory, likely due to heavy decalcification. Overt plasma cell neoplasm is not appreciated.  Interpretation 10/25/2016 Bone Marrow Flow Cytometry - NO MONOCLONAL B-CELL OR PHENOTYPICALLY ABERRANT T-CELL POPULATION IDENTIFIED.  Diagnosis 10/25/2016 Bone Marrow, Aspirate,Biopsy, and Clot - PLASMA CELL MYELOMA. - SEE COMMENT. PERIPHERAL BLOOD: - NORMOCYTIC ANEMIA. Diagnosis Note Overall, the marrow is hypercellular with increased lambda-restricted plasma cells (62% aspirate, 90% CD138). There is partial expression of CD20, but no monoclonal B-cell population is detected by flow cytometry.     RADIOGRAPHIC STUDIES: I have personally reviewed the radiological images as listed and agreed with the findings in the report. No results found.   DG Hip Unilateral 01/02/17 IMPRESSION: 1. Degenerative changes lumbar spine and both hips. No evidence of fracture or dislocation. 2. Diffuse osteopenia. Multiple tiny lucencies are noted throughout both proximal femurs. These may be secondary to the patient's known multiple myeloma.   ASSESSMENT & PLAN:  83 y.o.woman with HTN and CKD, presented with hypercalcemia, worsening anemia since 06/2016, s/p multiple fall, multiple hospitalization recently for back pain , CT scan showedT2 vertebral body compression fracture with mild height loss.  1. Multiple Myeloma, lambda  light chain disease, staging III, intermediate risk (13q-) -I previously reviewed the bone marrow biopsy results with the patient and her family, along with the scan findings  -she presented with hypercalcemia, anemia, mild renal failure and multiple fractures after fall -I  previously reviewed her cytogenetics and Fish testing results, which showed chromosome 13 every deletion, which is at intermediate risk for multiple myeloma. -We previously discussed treatment options.  due to advanced age, multiple comorbidities, very limited performance status, I do not believe she is a candidate for bone marrow transplant -I previously recommend her dexamethasone and Revlimid. Start on dexamethasone 18m weekly and Revlimid 122mdaily; 3 weeks on, 1 week off, at adjusted for her renal function. She has been tolerating well, dose increased to 1550mrom cycle 3 -due to her Intermediated risk disease, she may need Velcade. Due to her advanced age and comorbilities, I am concerned about her tolerance to Velcade. I will try RD for a few cycles, if she does not have good response, I will add Velcade.  -The goal of therapy is palliative, for disease control, improve her quality of life and prolong life. - Repeat multiple myeloma panel every 4 weeks  - she has underwent right hip surgery with Dr. MurPercell Miller June 2018, she was off Revlimid for about 6 weeks. - she has restarted Revlimid, tolerating well.  - if she has a fever of >100.4 she knows to call us [place] we can prescribe antibiotics  -continue Xgeva every 4 weeks -Her M-protein is negative and she is responding well to treatment. Will continue revlimid.  -Labs reviewed and her counts are overall stable. Potassium is normal now. Her Calcium is 8.0, secondary to Xgeva injection, so I want her to take 800 mg of calcium vitamin D. -She started revlimid next cycle on 04/22/09 -Continue weekly dexamethasone 20 mg when he is on Revlimid. I discussed possibly stopping dexamethasone in 2 months -I suggest she keeps her pain undercontrol and her appetite up so she can gain more weight.  -She has achieved excellent partial response, I will consider switching her to low dose Revlimid maintenance therapy and stopped dexamethasone in a few  months -f/u on 10/5    2. Anemia  -secondary to MM and Revlimid. -She received blood transfusion in hospital -will monitor CBC closely -consider blood transfusion if Hb<8.0 or symptomatic anemia with Hb 8-9 -Her anemia has slightly improved since he came off Revlimid, hemoglobin 10.3 (02/27/17) -Anemia is 9.4 today (04/24/17), does not need blood  transfusion. We will continue to monitor    3. Hypercalcemia  -She has received one dose of Aredia, hypercalcemia has nearly resolved now. We'll continue every 4 weeks for multiple myeloma, or more frequent if her hypercalcemia gets worse. -Her reached normal limits previously  - I previously encouraged her to take vitD, 800 unit daily  -Her Calcium is now 8.0 (04/24/17) so I suggest she takes some Vitamin D with Calcium 800 mg once a day  4. Chronic CKD, stage II -much improved, EGFR 50-60 -Avoid nephrotoxic medicine or contrast   5. Severe back pain and right hip pain from pathologic fracture -T2 compression fracture in the right hip is logical fracture secondary to her multiple myeloma -She will continue [name] every 12 hours and oxycodone as needed -we discussed taking laxative due to increased oxycodone prescription -We discussed using walker and avoid weight bearing on her right hip -Will give referral to orthopedic surgery for further evaluation  -Her pain is well managed  now with morphine and oxycodone, refill today  6. HTN -controlled with medication -managed by PCP  7. Osteoporosis and fractures -Takes Boniva, which has been switched to Aredia  -Due to her poor IV access, I'll switch Aredia to Xgeva every 4 weeks -Start Xgeva 01/02/17, and continue every 4 weeks, tolerating well  8. Goal of care discussion  -We previoulsy discussed the incurable nature of her MM, and the overall poor prognosis, especially if she does not have good response to chemotherapy or progress on chemo -The patient understands the goal of care is  palliative. -I recommend DNR/DNI, she will think about it   9. Nausea - I previously recommended she take some type of nausea medication. She is hesitant, but agreed totry Zofran 4-77m. She can take up to 3x daily. -She has constipation secondary to Zofran -continue Compazine for her nausea   10. Anorexia - I have previously encouraged her to eat snacks in between her meals -I previously recommended her to try mirtazapine 7.523mHS, may increase dose in future if needed  -Remeron is working well and her appetite has improved lately.  -Will increase mirtazapine to 1542m(04/24/17)  11. Swelling in lower extremity, swelling in right arm -Prescribed Lasix by Dr. WonJacelyn Grip discussed that she can take it as needed to not significantly effect her kidney function, potassium and calcium levels.  -I recommend her to elevate her feet and wear compression socks to help.  -Her right arm swelling is possible positional (she sleeps on her right side). I suggest sleeping on her left side or back and elevate her right arm, if no improvement, I'll get a Doppler to ruled out DVT. She plans to wait on doppler.   Plan -Refill Morphine and Oxycodone and mirtazapine (incrase to 67m10moday -xgeva injection today and continue every 4 weeks -Continue Revlimid 15 mg daily, 3 weeks on, one-week off, she started current cycle on 9/8, dexamethasone 20 mg weekly -Lab, f/u and xgeva injection on 10/5   All questions were answered. The patient knows to call the clinic with any problems, questions or concerns.  I spent 20 minutes counseling the patient face to face. The total time spent in the appointment was 30 minutes and more than 50% was on counseling.  This document serves as a record of services personally performed by Geronimo Diliberto Truitt Merle. It was created on her behalf by AmoyJoslyn Devontrained medical scribe. The creation of this record is based on the scribe's personal observations and the provider's statements to  them. This document has been checked and approved by the attending provider.    I have reviewed the above documentation for accuracy and completeness and I agree with the above.   FengTruitt Merle 04/24/2017

## 2017-04-24 ENCOUNTER — Other Ambulatory Visit (HOSPITAL_BASED_OUTPATIENT_CLINIC_OR_DEPARTMENT_OTHER): Payer: Medicare Other

## 2017-04-24 ENCOUNTER — Ambulatory Visit (HOSPITAL_BASED_OUTPATIENT_CLINIC_OR_DEPARTMENT_OTHER): Payer: Medicare Other

## 2017-04-24 ENCOUNTER — Telehealth: Payer: Self-pay

## 2017-04-24 ENCOUNTER — Ambulatory Visit (HOSPITAL_BASED_OUTPATIENT_CLINIC_OR_DEPARTMENT_OTHER): Payer: Medicare Other | Admitting: Hematology

## 2017-04-24 VITALS — BP 131/60 | HR 84 | Temp 98.4°F | Resp 18 | Ht <= 58 in | Wt 148.3 lb

## 2017-04-24 DIAGNOSIS — M7989 Other specified soft tissue disorders: Secondary | ICD-10-CM | POA: Diagnosis not present

## 2017-04-24 DIAGNOSIS — M8000XS Age-related osteoporosis with current pathological fracture, unspecified site, sequela: Secondary | ICD-10-CM

## 2017-04-24 DIAGNOSIS — D6481 Anemia due to antineoplastic chemotherapy: Secondary | ICD-10-CM | POA: Diagnosis not present

## 2017-04-24 DIAGNOSIS — C9000 Multiple myeloma not having achieved remission: Secondary | ICD-10-CM

## 2017-04-24 DIAGNOSIS — I10 Essential (primary) hypertension: Secondary | ICD-10-CM

## 2017-04-24 DIAGNOSIS — Z7189 Other specified counseling: Secondary | ICD-10-CM | POA: Diagnosis not present

## 2017-04-24 DIAGNOSIS — R110 Nausea: Secondary | ICD-10-CM | POA: Diagnosis not present

## 2017-04-24 DIAGNOSIS — N182 Chronic kidney disease, stage 2 (mild): Secondary | ICD-10-CM

## 2017-04-24 DIAGNOSIS — D630 Anemia in neoplastic disease: Secondary | ICD-10-CM | POA: Diagnosis not present

## 2017-04-24 LAB — CBC WITH DIFFERENTIAL/PLATELET
BASO%: 1 % (ref 0.0–2.0)
BASOS ABS: 0 10*3/uL (ref 0.0–0.1)
EOS%: 1 % (ref 0.0–7.0)
Eosinophils Absolute: 0 10*3/uL (ref 0.0–0.5)
HEMATOCRIT: 28.2 % — AB (ref 34.8–46.6)
HEMOGLOBIN: 9.4 g/dL — AB (ref 11.6–15.9)
LYMPH%: 10.1 % — ABNORMAL LOW (ref 14.0–49.7)
MCH: 31.8 pg (ref 25.1–34.0)
MCHC: 33.3 g/dL (ref 31.5–36.0)
MCV: 95.3 fL (ref 79.5–101.0)
MONO#: 0.1 10*3/uL (ref 0.1–0.9)
MONO%: 1.7 % (ref 0.0–14.0)
NEUT#: 2.5 10*3/uL (ref 1.5–6.5)
NEUT%: 86.2 % — AB (ref 38.4–76.8)
PLATELETS: 199 10*3/uL (ref 145–400)
RBC: 2.96 10*6/uL — ABNORMAL LOW (ref 3.70–5.45)
RDW: 15.7 % — AB (ref 11.2–14.5)
WBC: 2.9 10*3/uL — ABNORMAL LOW (ref 3.9–10.3)
lymph#: 0.3 10*3/uL — ABNORMAL LOW (ref 0.9–3.3)

## 2017-04-24 LAB — COMPREHENSIVE METABOLIC PANEL
ALBUMIN: 3.2 g/dL — AB (ref 3.5–5.0)
ALK PHOS: 68 U/L (ref 40–150)
ALT: <6 U/L (ref 0–55)
ANION GAP: 8 meq/L (ref 3–11)
AST: 11 U/L (ref 5–34)
BUN: 11.4 mg/dL (ref 7.0–26.0)
CALCIUM: 8 mg/dL — AB (ref 8.4–10.4)
CO2: 23 mEq/L (ref 22–29)
Chloride: 107 mEq/L (ref 98–109)
Creatinine: 1.1 mg/dL (ref 0.6–1.1)
EGFR: 54 mL/min/{1.73_m2} — AB (ref >90)
Glucose: 161 mg/dl — ABNORMAL HIGH (ref 70–140)
POTASSIUM: 4.3 meq/L (ref 3.5–5.1)
Sodium: 138 mEq/L (ref 136–145)
Total Bilirubin: 0.55 mg/dL (ref 0.20–1.20)
Total Protein: 6.1 g/dL — ABNORMAL LOW (ref 6.4–8.3)

## 2017-04-24 MED ORDER — MORPHINE SULFATE ER 30 MG PO TBCR: 30.0000 mg | EXTENDED_RELEASE_TABLET | Freq: Two times a day (BID) | ORAL | 0 refills | Status: DC

## 2017-04-24 MED ORDER — OXYCODONE HCL 10 MG PO TABS: 10.0000 mg | ORAL_TABLET | Freq: Four times a day (QID) | ORAL | 0 refills | Status: DC | PRN

## 2017-04-24 MED ORDER — MIRTAZAPINE 15 MG PO TABS: 15.0000 mg | ORAL_TABLET | Freq: Every day | ORAL | 2 refills | Status: DC

## 2017-04-24 MED ORDER — DENOSUMAB 120 MG/1.7ML ~~LOC~~ SOLN
120.0000 mg | Freq: Once | SUBCUTANEOUS | Status: AC
  Administered 2017-04-24: 120 mg via SUBCUTANEOUS
  Filled 2017-04-24: qty 1.7

## 2017-04-24 NOTE — Telephone Encounter (Signed)
Gave avs and calender to patient with upcoming appointment for 10/5. Per los

## 2017-04-25 ENCOUNTER — Encounter: Payer: Self-pay | Admitting: Hematology

## 2017-04-25 LAB — KAPPA/LAMBDA LIGHT CHAINS
IG LAMBDA FREE LIGHT CHAIN: 40.2 mg/L — AB (ref 5.7–26.3)
Ig Kappa Free Light Chain: 25.8 mg/L — ABNORMAL HIGH (ref 3.3–19.4)
KAPPA/LAMBDA FLC RATIO: 0.64 (ref 0.26–1.65)

## 2017-04-26 ENCOUNTER — Other Ambulatory Visit: Payer: Self-pay | Admitting: Hematology

## 2017-04-26 DIAGNOSIS — C9000 Multiple myeloma not having achieved remission: Secondary | ICD-10-CM

## 2017-04-26 MED ORDER — DEXAMETHASONE 4 MG PO TABS: 20.0000 mg | ORAL_TABLET | ORAL | 1 refills | Status: DC

## 2017-04-27 LAB — MULTIPLE MYELOMA PANEL, SERUM
Albumin SerPl Elph-Mcnc: 3.3 g/dL (ref 2.9–4.4)
Albumin/Glob SerPl: 1.4 (ref 0.7–1.7)
Alpha 1: 0.3 g/dL (ref 0.0–0.4)
Alpha2 Glob SerPl Elph-Mcnc: 0.7 g/dL (ref 0.4–1.0)
B-Globulin SerPl Elph-Mcnc: 0.9 g/dL (ref 0.7–1.3)
Gamma Glob SerPl Elph-Mcnc: 0.7 g/dL (ref 0.4–1.8)
Globulin, Total: 2.5 g/dL (ref 2.2–3.9)
IgA, Qn, Serum: 250 mg/dL (ref 64–422)
IgG, Qn, Serum: 821 mg/dL (ref 700–1600)
IgM, Qn, Serum: 19 mg/dL — ABNORMAL LOW (ref 26–217)
Total Protein: 5.8 g/dL — ABNORMAL LOW (ref 6.0–8.5)

## 2017-05-11 ENCOUNTER — Other Ambulatory Visit: Payer: Self-pay | Admitting: *Deleted

## 2017-05-11 ENCOUNTER — Telehealth: Payer: Self-pay | Admitting: *Deleted

## 2017-05-11 DIAGNOSIS — C9000 Multiple myeloma not having achieved remission: Secondary | ICD-10-CM

## 2017-05-11 MED ORDER — LENALIDOMIDE 15 MG PO CAPS: 15.0000 mg | ORAL_CAPSULE | Freq: Every day | ORAL | 0 refills | Status: DC

## 2017-05-11 NOTE — Telephone Encounter (Signed)
Spoke with daughter  on home phone,  and informed her to assist pt with calling [employer] to take pt survey for Revlimid refill.   This daughter stated she and her sister Lucita Ferrara will help pt.

## 2017-05-18 NOTE — Progress Notes (Signed)
[name]  [phone] Fax:([phone]) [phone]  Clinic Follow Up Note   Patient Care Team: [name], MD as PCP - General (Family Medicine) 05/19/2017  CHIEF COMPLAINTS:  Follow up Multiple Myeloma      Multiple myeloma ([name])   10/18/2016 - 10/26/2016 Hospital Admission    Pt was admitted for symptomatic hypercalcemia, AKI, bone marrow biopsy done during hospital stay, and she received Aredia       10/19/2016 Tumor Marker    SPEP showed M-protein 0.4g/dl, serum lambda light chain 5362, ratio 0.00      10/25/2016 Bone Marrow Biopsy     Overall, the marrow is hypercellular with increased lambda-restricted plasma cells (62% aspirate, 90% CD138). There is partial expression of CD20, but no monoclonal B-cell population is detected by flow cytometry.       10/25/2016 Initial Diagnosis    Multiple myeloma ([name])      10/25/2016 Miscellaneous    Cytogenetics and FISH showed loss of 13q, which is intermediate risk for MM       11/10/2016 -  Chemotherapy    Revlimid 10 mg (increased to 27m on cycle 2) daily, 3 weeks on, and one week off, dexamethasone 20 mg weekly, was off from early June to mid July 2018 due to her right hip fracture and hospitalization.       01/02/2017 Imaging     DG Hip Unilat w or w/o Pelvis IMPRESSION 1. Degenerative changes lumbar spine and both hips. No evidence of fracture or dislocation.  2. Diffuse osteopenia. Multiple tiny lucencies are noted throughout both proximal femurs. These may be secondary to the patient's known multiple myeloma.      01/18/2017 Imaging     CT hip right wo contrast  IMPRESSION: Findings consistent with a pathologic fracture of the right femoral neck. A large lytic lesion in the superior aspect of the lateral femoral head is identified.  Possible nondisplaced fracture of the right inferior pubic ramus.  Diffuse myeloma.      01/18/2017 - 01/23/2017 Hospital Admission    She present to the  ED with complaints of severe right hip pain. she recieved a right hip hemiarthroplasty with Dr. MPercell Miller      HISTORY OF PRESENTING ILLNESS (10/20/2016):  83year old African-American female, with past medical history of hypertension, chronic back pain, [name] stage III, who presents with altered mental status. She was admitted for hypercalcemia. I was called for consult to rule out multiple myeloma.   The patient is joined by family today. They help to report the patient's history. The patient has been lethargic and confused for the past week. She has issues with mobility at this time. She is constipated.   The family reports the patient fell last October, and her PCP suspected she broke her back. The patient received a cortisone injection in January, which was followed by onset of pain and decreased mobility. The patient was subsequently admitted to [name], where the family reports they discovered she was being given someone else's medicine. This led to the patient's confused mental status and difficulty with ambulation. The patient fell again while in [name], and she was brought back to home by her family. She has 24/7 care at home. In the ED, she was found to have calcium more than 15, she was admitted for further management. She received calcitonin, her calcium came down to 12.7 today. Her SPEP was positive for M protein-0.4 g/dl, possible represent a monoclonal gammopathy. Her  24-hour urine light chains are pending. Her PTH was normal at 20, PTH-rp is pending.   CURRENT THERAPY:  1. RD (Revlimid 10 mg daily, increased to 8m from cycle 3, 3 weeks on, one week off, and weekly dexamethasone 239m, started on 11/10/2016, Change to maintenence Revlimid 1066mnd stop dexamethasone starting next cycle, 06/10/17 2. Aredia 42m9mery 4 weeks, started on 10/24/2016 for hypercalcemia. Switch to Xgeva injections every 4 weeks on 01/02/17  INTERIM HISTORY: [name].  female is here for follow up accompanied by her sitter.  She notes she no changes since last visit. She feels she has a bladder infection. For the past week she has dysuria with burning. She is willing to do urine test today. She was previously taking monistat for the symptoms and that only helped a little. She has not tried cranberry juice but is willing to try it 2-3 times a day. Her pain overall is not bad, her back pain occurs occasionally and [name] every 12 hours. She takes oxycodone BID and tylenol as needed. Her BMs can get slow, constipated. She will take [place]. Her daughter is helping to take care of her. She is eating better, walking more and overall feeling better. She opted in for Flu vaccination today    MEDICAL HISTORY:  Past Medical History:  Diagnosis Date  . Allergy   . Arthritis   . Cancer (HCC)[place]. Chronic kidney disease (CKD), stage III (moderate) (HCC)[place] /notArchie Endo/2018  . Depression    Risperdal "twice/day" (09/23/2016)  . Glaucoma   . Hiatal hernia   . Hypertension   . Intractable back pain    /notes 09/23/2016  . Obesity   . Osteoporosis    vertebral fracture in 2012  . Upper GI bleed 04/2004   secondary to Mallory-Weiss tear/notes 12/28/2010  . Vertigo     SURGICAL HISTORY: Past Surgical History:  Procedure Laterality Date  . ABDOMINAL HYSTERECTOMY  1974   partial  . APPENDECTOMY     as a child/notes 12/28/2010  . BACK SURGERY    . BREAST BIOPSY Left 1999   /notArchie Endo5/2012  . CATARACT EXTRACTION, BILATERAL Bilateral 2000s  . ESOPHAGOGASTRODUODENOSCOPY  04/2004   /notArchie Endo5/2012  . HIP ARTHROPLASTY Right 01/19/2017   Procedure: RIGHT HIP HEMIARTHROPLASTY;  Surgeon: MurpRenette Butters;  [facility];  Laterality: Right;  . KYPHOPLASTY  05/2011   T10/notes 05/25/2011  . LAPAROSCOPIC CHOLECYSTECTOMY  10/2007   /notArchie Endo/2012    SOCIAL HISTORY: Social History   Social History  . Marital status: Widowed     Spouse name: N/A  . Number of children: N/A  . Years of education: N/A   Occupational History  . Not on file.   Social History Main Topics  . Smoking status: Never Smoker  . Smokeless tobacco: Never Used  . Alcohol use Yes     Comment: 09/23/2016 "glass of wine a few times/year"  . Drug use: No  . Sexual activity: No   Other Topics Concern  . Not on file   Social History Narrative  . No narrative on file    FAMILY HISTORY: Family History  Problem Relation Age of Onset  . Hypertension Mother   . Osteoporosis Mother   . CVA Mother   . Cancer Neg Hx     ALLERGIES:  is allergic to penicillins; alendronate; and sulfonamide derivatives.  MEDICATIONS:  Current Outpatient Prescriptions  Medication Sig Dispense Refill  . aspirin 81 MG chewable tablet Chew 81 mg by mouth daily.    . bisacodyl (DULCOLAX) 10 MG suppository Place 1 suppository (10 mg total) rectally daily as needed for moderate constipation. 12 suppository 0  . cefdinir (OMNICEF) 300 MG capsule TAKE 1 CAPSULE BY MOUTH EVERY 12 HOURS FOR 10 DAYS  0  . dexamethasone (DECADRON) 4 MG tablet Take 5 tablets (20 mg total) by mouth once a week. 30 tablet 1  . ezetimibe (ZETIA) 10 MG tablet Take 10 mg by mouth daily.  3  . feeding supplement, ENSURE ENLIVE, (ENSURE ENLIVE) LIQD Take 237 mLs by mouth 2 (two) times daily between meals. 237 mL 12  . furosemide (LASIX) 20 MG tablet Take 20 mg by mouth daily.  1  . lenalidomide (REVLIMID) 15 MG capsule Take 1 capsule (15 mg total) by mouth daily. Take 15 mg daily for  21  Days  ,  Off  7  Days. 21 capsule 0  . magnesium oxide (MAG-OX) 400 (241.3 Mg) MG tablet Take 1 tablet (400 mg total) by mouth daily. 7 tablet 0  . magnesium oxide (MAG-OX) 400 MG tablet Take 1 tablet by mouth daily.  5  . meclizine (ANTIVERT) 25 MG tablet TAKE ONE TABLET BY MOUTH THREE TIMES DAILY AS NEEDED 90 tablet 0  . megestrol (MEGACE) 40 MG tablet Take 1 tablet (40 mg total) by mouth daily.    .  mirtazapine (REMERON) 15 MG tablet Take 1 tablet (15 mg total) by mouth at bedtime. 30 tablet 2  . mirtazapine (REMERON) 7.5 MG tablet Take 7.5 mg by mouth at bedtime.  5  . morphine ([name]) 30 MG 12 hr tablet Take 1 tablet (30 mg total) by mouth every 12 (twelve) hours. 60 tablet 0  . ondansetron (ZOFRAN ODT) 4 MG disintegrating tablet Take 1-2 tablets (4-8 mg total) by mouth every 8 (eight) hours as needed for nausea or vomiting. 30 tablet 2  . Oxycodone HCl 10 MG TABS Take 1 tablet (10 mg total) by mouth every 6 (six) hours as needed. FOR MODERATE OR SEVERE PAIN 75 tablet 0  . phenazopyridine (PYRIDIUM) 200 MG tablet TAKE 1 TABLET 3 TIMES A DAY AFTER MEALS  0  . polyethylene glycol (MIRALAX / GLYCOLAX) packet Take 17 g by mouth 2 (two) times daily. 14 each 0  . RESTASIS 0.05 % ophthalmic emulsion Place 1 drop into both eyes daily.     . risperiDONE (RISPERDAL) 0.5 MG tablet Take 0.5 mg by mouth 2 (two) times daily.    . sennosides-docusate sodium (SENOKOT-S) 8.6-50 MG tablet Take 2 tablets by mouth daily.    Marland Kitchen lenalidomide (REVLIMID) 10 MG capsule Take 1 capsule (10 mg total) by mouth daily. 21 capsule 2   No current facility-administered medications for this visit.     REVIEW OF SYSTEMS:  Constitutional: Denies fevers, chills or abnormal night sweats (+) decreased fatigue and increased strength (+) improved appetite Eyes: Denies blurriness of vision, double vision or watery eyes Ears, nose, mouth, throat, and face: Denies mucositis or sore throat  Respiratory: Denies cough, dyspnea or wheezes Cardiovascular: Denies palpitation, chest discomfort  Gastrointestinal:  Denies heartburn (+) occasional constipation  Skin: Denies abnormal skin rashes Lymphatics: Denies new lymphadenopathy or easy bruising  Neurological:Denies numbness, tingling or new weaknesses  Behavioral/Psych: Mood is stable, no new changes  Musculoskeletal: (+) improved back pain All other systems were reviewed with  the patient and are negative.  PHYSICAL  EXAMINATION: ECOG PERFORMANCE STATUS: 3  Vitals:   05/19/17 1044  BP: (!) 127/54  Pulse: 92  Resp: 18  Temp: 99 F (37.2 C)  SpO2: 98%   Filed Weights   05/19/17 1044  Weight: 142 lb 6.4 oz (64.6 kg)     GENERAL:alert, no distress and comfortable, sitting in wheelchair  SKIN: skin color, texture, turgor are normal, no rashes or significant lesions EYES: normal, conjunctiva are pink and non-injected, sclera clear OROPHARYNX:no exudate, no erythema and lips, buccal mucosa, and tongue normal  NECK: supple, thyroid normal size, non-tender, without nodularity LYMPH:  no palpable lymphadenopathy in the cervical, axillary or inguinal LUNGS: clear to auscultation and percussion with normal breathing effort HEART: regular rate & rhythm and no murmurs and no lower extremity edema ABDOMEN:abdomen soft, non-tender and normal bowel sounds Musculoskeletal:no cyanosis of digits and no clubbing  PSYCH: alert & oriented x 3 with fluent speech NEURO: no focal motor/sensory deficits  LABORATORY DATA:  I have reviewed the data as listed CBC Latest Ref Rng & Units 05/19/2017 04/24/2017 03/24/2017  WBC 3.9 - 10.3 10e3/uL 5.1 2.9(L) 6.6  Hemoglobin 11.6 - 15.9 g/dL 9.8(L) 9.4(L) 9.7(L)  Hematocrit 34.8 - 46.6 % 29.3(L) 28.2(L) 29.2(L)  Platelets 145 - 400 10e3/uL 256 199 233   CMP Latest Ref Rng & Units 05/19/2017 04/24/2017 04/24/2017  Glucose 70 - 140 mg/dl 145(H) 161(H) -  BUN 7.0 - 26.0 mg/dL 10.1 11.4 -  Creatinine 0.6 - 1.1 mg/dL 1.0 1.1 -  Sodium 136 - 145 mEq/L 139 138 -  Potassium 3.5 - 5.1 mEq/L 3.9 4.3 -  Chloride 101 - 111 mmol/L - - -  CO2 22 - 29 mEq/L 23 23 -  Calcium 8.4 - 10.4 mg/dL 8.1(L) 8.0(L) -  Total Protein 6.4 - 8.3 g/dL 5.8(L) 6.1(L) 5.8(L)  Total Bilirubin 0.20 - 1.20 mg/dL 0.73 0.55 -  Alkaline Phos 40 - 150 U/L 61 68 -  AST 5 - 34 U/L 9 11 -  ALT 0-55 U/L U/L <6 <6 -    MM LAB: M-protein (g/dl) 10/19/2016: 0.4 11/01/2016:  Not Det 12/05/16: Not Det 01/02/17: Not det 02/27/17: Not det 03/24/17: not det.  04/24/17: not det.  05/19/17: PENDING    Serum kappa, lamda light chains and ratio (mg/L) 10/21/2016: 14.8, 5362, 0.00 11/01/2016: 1.49, 10.88, 0.14 12/05/2016: 2.16, 19.31, 0.11 01/02/17: 3.01, 10.90, 0.28 02/27/17: 1.49, 20.64, 0.07 03/24/17: 2.19, 3.90, 0.56 04/24/17: 2.58, 4.02, 0.64 05/19/17: PENDING  24 hour urine (total protein, free lambda, ration) 10/21/2016: 1463, 1570, 0.06   PATHOLOGY  Diagnosis 01/19/2017 Femoral head, fracture, Right - FRACTURE, SEE COMMENT. Microscopic Comment There are scattered plasma cells seen with CD138, predominately in the area of fracture. Light chain in situ hybridization is non-contributory, likely due to heavy decalcification. Overt plasma cell neoplasm is not appreciated.  Interpretation 10/25/2016 Bone Marrow Flow Cytometry - NO MONOCLONAL B-CELL OR PHENOTYPICALLY ABERRANT T-CELL POPULATION IDENTIFIED.  Diagnosis 10/25/2016 Bone Marrow, Aspirate,Biopsy, and Clot - PLASMA CELL MYELOMA. - SEE COMMENT. PERIPHERAL BLOOD: - NORMOCYTIC ANEMIA. Diagnosis Note Overall, the marrow is hypercellular with increased lambda-restricted plasma cells (62% aspirate, 90% CD138). There is partial expression of CD20, but no monoclonal B-cell population is detected by flow cytometry.     RADIOGRAPHIC STUDIES: I have personally reviewed the radiological images as listed and agreed with the findings in the report. No results found.   DG Hip Unilateral 01/02/17 IMPRESSION: 1. Degenerative changes lumbar spine and both hips. No evidence of fracture or dislocation.  2. Diffuse osteopenia. Multiple tiny lucencies are noted throughout both proximal femurs. These may be secondary to the patient's known multiple myeloma.   ASSESSMENT & PLAN:  83 y.o.woman with HTN and CKD, presented with hypercalcemia, worsening anemia since 06/2016, s/p multiple fall, multiple hospitalization  recently for back pain , CT scan showedT2 vertebral body compression fracture with mild height loss.  1. 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 -We previously discussed treatment options.  due to advanced age, multiple comorbidities, very limited performance status, I do not believe she is a candidate for bone marrow transplant -I previously recommend her dexamethasone and Revlimid. Start on dexamethasone 77m weekly and Revlimid 111mdaily; 3 weeks on, 1 week off, at adjusted for her renal function. She has been tolerating well, dose increased to 1563mrom cycle 3 -due to her Intermediated risk disease, she may need Velcade. Due to her advanced age and comorbilities, I am concerned about her tolerance to Velcade. I will try RD for a few cycles, if she does not have good response, I will add Velcade.  -The goal of therapy is palliative, for disease control, improve her quality of life and prolong life. - Repeat multiple myeloma panel every 4 weeks  - she has underwent right hip surgery with Dr. MurPercell Miller June 2018, she was off Revlimid for about 6 weeks. - she has been tolerating Revlimid and dexamethasone well -Her M-protein is negative and she is responding well to treatment. Will continue revlimid.  -Given her excellent response to treatment, I will change her very limited to maintenance therapy from next cycle, 10 mg daily, for 3 weeks on and one-week off, and was stopped dexamethasone. This will likely reduce her risk of infection.  -Labs reviewed and she sis sightly anemic, Hg 9.8,  but does not need blood transfusion. Her other counts are overall stable. Her M protein has been negative lately and her Lambda light chain has reduced to 39.0. Her disease is well controlled on Revelimid.  -She will finish this current cycle in 2 weeks. Then she will change to low dose manintence Revlimid 10 mg and stop her dexamethasone starting next cycle. She will continue unless disease progresses.  -She opted in for Flu vaccination today, and I suggest she get her Pneumonia vaccination with her PCP.  -f/u in 4 weeks    2. Anemia  -secondary to MM and Revlimid. -She received blood transfusion in hospital -will monitor CBC closely -consider blood transfusion if Hb<8.0 or symptomatic anemia with Hb 8-9 -Her anemia has slightly improved since he came off Revlimid, hemoglobin 10.3 (02/27/17) -Anemia is 9.4 today (04/24/17), does not need blood  transfusion. We will continue to monitor -HG slightly improved but overall stable, Hg at 9.8 (05/19/17)    3. History of ypercalcemia/hypocalcemia  -She has received one dose of Aredia, hypercalcemia has nearly resolved now. We'll continue every 4 weeks for multiple myeloma, or more frequent if her hypercalcemia gets worse. -Her reached normal limits previously  - I previously encouraged her to take vitD, 800 unit daily  -Her Calcium is now 8.0 (04/24/17) so I suggest she takes some Vitamin D with  Calcium 800 mg once a day, I'll increase her calcium to twice daily -calcium stable at 8.1 (05/19/17), no Xgeva today, we'll resume Xgeva injection next month if her calcium level improves  4. Chronic CKD, stage II -much improved, EGFR 50-60 -Avoid nephrotoxic medicine or contrast   5. Severe back pain and right hip pain from pathologic fracture -T2 compression fracture in the right hip is logical fracture secondary to her multiple myeloma -She will continue [name] every 12 hours and oxycodone as needed -we discussed taking laxative due to increased  oxycodone prescription -We discussed using walker and avoid weight bearing on her right hip -Will give referral to orthopedic surgery for further evaluation  -Her pain is well managed now with morphine and [name] and oxycodone, refilled today (05/19/17) -She is fine to take tylenol, but she should avoid advil due to her MM.   6. HTN -controlled with medication -managed by PCP  7. Osteoporosis and fractures -Takes Boniva, which has been switched to Aredia  -Due to her poor IV access, I'll switch Aredia to Xgeva every 4 weeks -Started Xgeva 01/02/17, and continue every 4 weeks, tolerating well. She will continue for the next 2 years  8. Goal of care discussion  -We previoulsy discussed the incurable nature of her MM, and the overall poor prognosis, especially if she does not have good response to chemotherapy or progress on chemo -The patient understands the goal of care is palliative. -I recommend DNR/DNI, she will think about it   9. Nausea  - I previously recommended she take some type of nausea medication. She is hesitant, but agreed to try Zofran 4-14m. She can take up to 3x daily. -She has constipation secondary to Zofran -continue Compazine for her nausea  -Overall improved  10. Anorexia  - I have previously encouraged her to eat snacks in between her meals -I previously recommended her to try mirtazapine 7.557mHS, may increase dose in future if needed  -Remeron is working well and her appetite has improved lately.  -Will increase mirtazapine to 1550m(04/24/17) -Appetite has improved.   11. Swelling in lower extremity, swelling in right arm  -Prescribed Lasix by Dr. WonJacelyn Grip discussed that she can take it as needed to not significantly effect her kidney function, potassium and calcium levels.  -I recommend her to elevate her feet and wear compression socks to help.  -Her right arm swelling is possibly positional (she sleeps on her right side). I suggest sleeping on her left  side or back and elevate her right arm, if no improvement, I'll get a Doppler to ruled out DVT. She plans to wait on doppler.   12. Dysuria, possible UTI -For the past few days she has been experiencing burning with urination -Will do a Urine test today and if results show UTI I will call in antibiotics for her to take   Plan -Refill Oxycodone and [name] today, her pain is controlled -Flu vaccination today  -Complete current cycle Revlimid 15 mg daily and next cycle change to low dose 10 mg Revlimid and stop dexamethasone -Urine test today, her UA was positive, I called in Cipro 500 Mabel twice daily for 5 days, we'll follow-up her urine culture results -Due to 8.1 Calcium, she will cancel Xgeva today, resume next month if calcium level improves  -Lab, f/u and injection in 4 weeks   All questions were answered. The patient knows to call the clinic with any problems, questions or concerns.  I spent  20 minutes counseling the patient face to face. The total time spent in the appointment was 30 minutes and more than 50% was on counseling.  This document serves as a record of services personally performed by Truitt Merle, MD. It was created on her behalf by Joslyn Devon, a trained medical scribe. The creation of this record is based on the scribe's personal observations and the provider's statements to them. This document has been checked and approved by the attending provider.    I have reviewed the above documentation for accuracy and completeness and I agree with the above.   Truitt Merle, MD 05/19/2017

## 2017-05-19 ENCOUNTER — Ambulatory Visit (HOSPITAL_BASED_OUTPATIENT_CLINIC_OR_DEPARTMENT_OTHER): Payer: Medicare Other

## 2017-05-19 ENCOUNTER — Telehealth: Payer: Self-pay | Admitting: Hematology

## 2017-05-19 ENCOUNTER — Encounter: Payer: Self-pay | Admitting: Hematology

## 2017-05-19 ENCOUNTER — Telehealth: Payer: Self-pay | Admitting: *Deleted

## 2017-05-19 ENCOUNTER — Ambulatory Visit: Payer: Medicare Other

## 2017-05-19 ENCOUNTER — Other Ambulatory Visit (HOSPITAL_BASED_OUTPATIENT_CLINIC_OR_DEPARTMENT_OTHER): Payer: Medicare Other

## 2017-05-19 ENCOUNTER — Ambulatory Visit (HOSPITAL_BASED_OUTPATIENT_CLINIC_OR_DEPARTMENT_OTHER): Payer: Medicare Other | Admitting: Hematology

## 2017-05-19 VITALS — BP 127/54 | HR 92 | Temp 99.0°F | Resp 18 | Ht <= 58 in | Wt 142.4 lb

## 2017-05-19 DIAGNOSIS — M545 Low back pain: Secondary | ICD-10-CM

## 2017-05-19 DIAGNOSIS — N3000 Acute cystitis without hematuria: Secondary | ICD-10-CM

## 2017-05-19 DIAGNOSIS — Z7189 Other specified counseling: Secondary | ICD-10-CM | POA: Diagnosis not present

## 2017-05-19 DIAGNOSIS — Z23 Encounter for immunization: Secondary | ICD-10-CM

## 2017-05-19 DIAGNOSIS — D630 Anemia in neoplastic disease: Secondary | ICD-10-CM | POA: Diagnosis not present

## 2017-05-19 DIAGNOSIS — C9000 Multiple myeloma not having achieved remission: Secondary | ICD-10-CM

## 2017-05-19 DIAGNOSIS — I10 Essential (primary) hypertension: Secondary | ICD-10-CM | POA: Diagnosis not present

## 2017-05-19 DIAGNOSIS — M4850XA Collapsed vertebra, not elsewhere classified, site unspecified, initial encounter for fracture: Secondary | ICD-10-CM

## 2017-05-19 DIAGNOSIS — N182 Chronic kidney disease, stage 2 (mild): Secondary | ICD-10-CM | POA: Diagnosis not present

## 2017-05-19 DIAGNOSIS — D6481 Anemia due to antineoplastic chemotherapy: Secondary | ICD-10-CM

## 2017-05-19 DIAGNOSIS — K5900 Constipation, unspecified: Secondary | ICD-10-CM | POA: Diagnosis not present

## 2017-05-19 DIAGNOSIS — R300 Dysuria: Secondary | ICD-10-CM

## 2017-05-19 DIAGNOSIS — M810 Age-related osteoporosis without current pathological fracture: Secondary | ICD-10-CM

## 2017-05-19 LAB — CBC WITH DIFFERENTIAL/PLATELET
BASO%: 2.6 % — ABNORMAL HIGH (ref 0.0–2.0)
BASOS ABS: 0.1 10*3/uL (ref 0.0–0.1)
EOS%: 2.8 % (ref 0.0–7.0)
Eosinophils Absolute: 0.1 10*3/uL (ref 0.0–0.5)
HCT: 29.3 % — ABNORMAL LOW (ref 34.8–46.6)
HGB: 9.8 g/dL — ABNORMAL LOW (ref 11.6–15.9)
LYMPH%: 30.4 % (ref 14.0–49.7)
MCH: 33 pg (ref 25.1–34.0)
MCHC: 33.4 g/dL (ref 31.5–36.0)
MCV: 98.7 fL (ref 79.5–101.0)
MONO#: 0.7 10*3/uL (ref 0.1–0.9)
MONO%: 13.6 % (ref 0.0–14.0)
NEUT#: 2.6 10*3/uL (ref 1.5–6.5)
NEUT%: 50.6 % (ref 38.4–76.8)
Platelets: 256 10*3/uL (ref 145–400)
RBC: 2.97 10*6/uL — ABNORMAL LOW (ref 3.70–5.45)
RDW: 17.8 % — AB (ref 11.2–14.5)
WBC: 5.1 10*3/uL (ref 3.9–10.3)
lymph#: 1.5 10*3/uL (ref 0.9–3.3)

## 2017-05-19 LAB — COMPREHENSIVE METABOLIC PANEL
ALT: <6 U/L (ref 0–55)
AST: 9 U/L (ref 5–34)
Albumin: 3.2 g/dL — ABNORMAL LOW (ref 3.5–5.0)
Alkaline Phosphatase: 61 U/L (ref 40–150)
Anion Gap: 8 mEq/L (ref 3–11)
BUN: 10.1 mg/dL (ref 7.0–26.0)
CHLORIDE: 108 meq/L (ref 98–109)
CO2: 23 meq/L (ref 22–29)
CREATININE: 1 mg/dL (ref 0.6–1.1)
Calcium: 8.1 mg/dL — ABNORMAL LOW (ref 8.4–10.4)
EGFR: 59 mL/min/{1.73_m2} — ABNORMAL LOW (ref >90)
Glucose: 145 mg/dl — ABNORMAL HIGH (ref 70–140)
POTASSIUM: 3.9 meq/L (ref 3.5–5.1)
SODIUM: 139 meq/L (ref 136–145)
Total Bilirubin: 0.73 mg/dL (ref 0.20–1.20)
Total Protein: 5.8 g/dL — ABNORMAL LOW (ref 6.4–8.3)

## 2017-05-19 LAB — URINALYSIS, MICROSCOPIC - CHCC
BILIRUBIN (URINE): NEGATIVE
Glucose: NEGATIVE mg/dL
KETONES: NEGATIVE mg/dL
NITRITE: POSITIVE
Protein: 30 mg/dL
Specific Gravity, Urine: 1.01 (ref 1.003–1.035)
Urobilinogen, UR: 0.2 mg/dL (ref 0.2–1)
pH: 6.5 (ref 4.6–8.0)

## 2017-05-19 MED ORDER — INFLUENZA VAC SPLIT QUAD 0.5 ML IM SUSY
0.5000 mL | PREFILLED_SYRINGE | Freq: Once | INTRAMUSCULAR | Status: AC
  Administered 2017-05-19: 0.5 mL via INTRAMUSCULAR
  Filled 2017-05-19: qty 0.5

## 2017-05-19 MED ORDER — MORPHINE SULFATE ER 30 MG PO TBCR: 30.0000 mg | EXTENDED_RELEASE_TABLET | Freq: Two times a day (BID) | ORAL | 0 refills | Status: DC

## 2017-05-19 MED ORDER — CIPROFLOXACIN HCL 500 MG PO TABS: 500.0000 mg | ORAL_TABLET | Freq: Two times a day (BID) | ORAL | 0 refills | Status: DC

## 2017-05-19 MED ORDER — LENALIDOMIDE 10 MG PO CAPS: 10.0000 mg | ORAL_CAPSULE | Freq: Every day | ORAL | 2 refills | Status: DC

## 2017-05-19 MED ORDER — OXYCODONE HCL 10 MG PO TABS: 10.0000 mg | ORAL_TABLET | Freq: Four times a day (QID) | ORAL | 0 refills | Status: DC | PRN

## 2017-05-19 NOTE — Telephone Encounter (Signed)
Called daughter, Lucita Ferrara & informed of positive urinalysis & ATB was sent in by Dr Burr Medico.  Informed if culture grew anything & ATB needs to be changed, we would call.  She expressed understanding.

## 2017-05-19 NOTE — Telephone Encounter (Signed)
Gave patient AVS and calendar of upcoming November appointments.

## 2017-05-22 ENCOUNTER — Telehealth: Payer: Self-pay | Admitting: Hematology

## 2017-05-22 LAB — KAPPA/LAMBDA LIGHT CHAINS
Ig Kappa Free Light Chain: 22.2 mg/L — ABNORMAL HIGH (ref 3.3–19.4)
Ig Lambda Free Light Chain: 35.5 mg/L — ABNORMAL HIGH (ref 5.7–26.3)
Kappa/Lambda FluidC Ratio: 0.63 (ref 0.26–1.65)

## 2017-05-22 LAB — URINE CULTURE

## 2017-05-22 NOTE — Telephone Encounter (Signed)
05/19/17 prescription refill. Prescription scanned into media

## 2017-05-24 ENCOUNTER — Telehealth: Payer: Self-pay | Admitting: Hematology

## 2017-05-24 LAB — MULTIPLE MYELOMA PANEL, SERUM
ALBUMIN SERPL ELPH-MCNC: 2.7 g/dL — AB (ref 2.9–4.4)
Albumin/Glob SerPl: 1.2 (ref 0.7–1.7)
Alpha 1: 0.2 g/dL (ref 0.0–0.4)
Alpha2 Glob SerPl Elph-Mcnc: 0.6 g/dL (ref 0.4–1.0)
B-GLOBULIN SERPL ELPH-MCNC: 0.8 g/dL (ref 0.7–1.3)
Gamma Glob SerPl Elph-Mcnc: 0.7 g/dL (ref 0.4–1.8)
Globulin, Total: 2.4 g/dL (ref 2.2–3.9)
IGG (IMMUNOGLOBIN G), SERUM: 819 mg/dL (ref 700–1600)
IgA, Qn, Serum: 221 mg/dL (ref 64–422)
IgM, Qn, Serum: 23 mg/dL — ABNORMAL LOW (ref 26–217)
TOTAL PROTEIN: 5.1 g/dL — AB (ref 6.0–8.5)

## 2017-05-24 NOTE — Telephone Encounter (Signed)
05/23/17 prescription refill scanned in media to view

## 2017-05-25 ENCOUNTER — Telehealth: Payer: Self-pay | Admitting: *Deleted

## 2017-05-25 ENCOUNTER — Telehealth: Payer: Self-pay | Admitting: Hematology

## 2017-05-25 ENCOUNTER — Other Ambulatory Visit: Payer: Self-pay | Admitting: *Deleted

## 2017-05-25 DIAGNOSIS — C9000 Multiple myeloma not having achieved remission: Secondary | ICD-10-CM

## 2017-05-25 MED ORDER — MIRTAZAPINE 15 MG PO TABS: 15.0000 mg | ORAL_TABLET | Freq: Every day | ORAL | 1 refills | Status: DC

## 2017-05-25 NOTE — Telephone Encounter (Signed)
Yes, please refill her mirtazapine and revlimid 70m, thanks   YTruitt MerleMD

## 2017-05-25 NOTE — Telephone Encounter (Signed)
05/24/17 prescription refill scanned in media to view

## 2017-05-25 NOTE — Telephone Encounter (Signed)
TCT patient's home and spoke with pt's daughter. Advised daughter that pt's urine culture was negative. Daughter states pt is feeling better on Cipro. Encouraged daughter to have pt push fluids, water, cranberry juice. Advised to call back to cancer center if pt not feeling better or symptoms return.  Daughter voiced understanding.

## 2017-05-31 ENCOUNTER — Other Ambulatory Visit: Payer: Self-pay | Admitting: *Deleted

## 2017-06-01 ENCOUNTER — Telehealth: Payer: Self-pay

## 2017-06-01 MED ORDER — LENALIDOMIDE 10 MG PO CAPS: 10.0000 mg | ORAL_CAPSULE | Freq: Every day | ORAL | 2 refills | Status: DC

## 2017-06-01 NOTE — Telephone Encounter (Signed)
[name] called asking if the revlimid was decreasing from 15 mg to 10 mg. The pharmacy was asking her about this. Called back and LVM that yes the revlimid is decreasing and the pt is to stop her dex at that time as well. This is per OV note 05/19/17

## 2017-06-13 NOTE — Progress Notes (Signed)
[name]  [phone] [phone]  Clinic Follow Up Note   Patient Care Team: [name], MD as PCP - General (Family Medicine) 06/16/2017  CHIEF COMPLAINTS:  Follow up Multiple Myeloma      Multiple myeloma ([place] [name])   10/18/2016 - 10/26/2016 Hospital Admission    Pt was admitted for symptomatic hypercalcemia, AKI, bone marrow biopsy done during hospital stay, and she received Aredia       10/19/2016 Tumor Marker    SPEP showed M-protein 0.4g/dl, serum lambda light chain 5362, ratio 0.00      10/25/2016 Bone Marrow Biopsy     Overall, the marrow is hypercellular with increased lambda-restricted plasma cells (62% aspirate, 90% CD138). There is partial expression of CD20, but no monoclonal B-cell population is detected by flow cytometry.       10/25/2016 Initial Diagnosis    Multiple myeloma ([place])      10/25/2016 Miscellaneous    Cytogenetics and FISH showed loss of 13q, which is intermediate risk for MM       11/10/2016 -  Chemotherapy    Revlimid 10 mg (increased to 63m on cycle 2) daily, 3 weeks on, and one week off, dexamethasone 20 mg weekly, was off from early June to mid July 2018 due to her right hip fracture and hospitalization.  Changed to maintenence Revlimid 159mand stopped dexamethasone starting next cycle, 06/10/17         01/02/2017 Imaging     DG Hip Unilat w or w/o Pelvis IMPRESSION 1. Degenerative changes lumbar spine and both hips. No evidence of fracture or dislocation.  2. Diffuse osteopenia. Multiple tiny lucencies are noted throughout both proximal femurs. These may be secondary to the patient's known multiple myeloma.      01/18/2017 Imaging     CT hip right wo contrast  IMPRESSION: Findings consistent with a pathologic fracture of the right femoral neck. A large lytic lesion in the superior aspect of the lateral femoral head is identified.  Possible nondisplaced fracture of the right inferior pubic  ramus.  Diffuse myeloma.      01/18/2017 - 01/23/2017 Hospital Admission    She present to the ED with complaints of severe right hip pain. she recieved a right hip hemiarthroplasty with Dr. MuPercell Miller     HISTORY OF PRESENTING ILLNESS (10/20/2016):  [age]ear old African-American female, with past medical history of hypertension, chronic back pain, [name] stage III, who presents with altered mental status. She was admitted for hypercalcemia. I was called for consult to rule out multiple myeloma.   The patient is joined by family today. They help to report the patient's history. The patient has been lethargic and confused for the past week. She has issues with mobility at this time. She is constipated.   The family reports the patient fell last October, and her PCP suspected she broke her back. The patient received a cortisone injection in January, which was followed by onset of pain and decreased mobility. The patient was subsequently admitted to [name], where the family reports they discovered she was being given someone else's medicine. This led to the patient's confused mental status and difficulty with ambulation. The patient fell again while in [name], and she was brought back to home by her family. She has 24/7 care at home. In the ED, she was found to have calcium more than 15, she was admitted for further management. She received calcitonin, her calcium came down to 12.7  today. Her SPEP was positive for M protein-0.4 g/dl, possible represent a monoclonal gammopathy. Her 24-hour urine light chains are pending. Her PTH was normal at 20, PTH-rp is pending.   CURRENT THERAPY:  1. RD (Revlimid 10 mg daily, increased to 21m from cycle 3, 3 weeks on, one week off, and weekly dexamethasone 27m, started on 11/10/2016. 2.Change to maintenence Revlimid 1057maily for 3 weeks on and 1 week off, and stopped dexamethasone started on 06/10/17 3. Aredia 73m8mery 4 weeks, started  on 10/24/2016 for hypercalcemia. Switch to Xgeva injections every 4 weeks on 01/02/17  INTERIM HISTORY: [name] 83 y24. female here for follow up accompanied by her daughter. She reports things are going well for her. The UTI she had previously has resolved and she no longer has dysuria. She notes the pain in her right side is better managed with Morphine BID and oxycodone BID. She denies cough or abdominal pain. Her daughter reports since PT she has become more independent. She does now use a walker to walk. She will walk outside when she can.  She had mild nausea and diarrhea this morning which she thinks this is related to something she ate. Her current cycle started last week.  She takes Megace and Mirtazapine for her appetite and this works for her.     MEDICAL HISTORY:  Past Medical History:  Diagnosis Date  . Allergy   . Arthritis   . Cancer (HCC)[place]. Chronic kidney disease (CKD), stage III (moderate) (HCC)[place] /notArchie Endo/2018  . Depression    Risperdal "twice/day" (09/23/2016)  . Glaucoma   . Hiatal hernia   . Hypertension   . Intractable back pain    /notes 09/23/2016  . Obesity   . Osteoporosis    vertebral fracture in 2012  . Upper GI bleed 04/2004   secondary to Mallory-Weiss tear/notes 12/28/2010  . Vertigo     SURGICAL HISTORY: Past Surgical History:  Procedure Laterality Date  . ABDOMINAL HYSTERECTOMY  1974   partial  . APPENDECTOMY     as a child/notes 12/28/2010  . BACK SURGERY    . BREAST BIOPSY Left 1999   /notArchie Endo5/2012  . CATARACT EXTRACTION, BILATERAL Bilateral 2000s  . ESOPHAGOGASTRODUODENOSCOPY  04/2004   /notArchie Endo5/2012  . HIP ARTHROPLASTY Right 01/19/2017   Procedure: RIGHT HIP HEMIARTHROPLASTY;  Surgeon: MurpRenette Butters;  [facility];  Laterality: Right;  . KYPHOPLASTY  05/2011   T10/notes 05/25/2011  . LAPAROSCOPIC CHOLECYSTECTOMY  10/2007   /notArchie Endo/2012    SOCIAL HISTORY: Social History    Social History  . Marital status: Widowed    Spouse name: N/A  . Number of children: N/A  . Years of education: N/A   Occupational History  . Not on file.   Social History Main Topics  . Smoking status: Never Smoker  . Smokeless tobacco: Never Used  . Alcohol use Yes     Comment: 09/23/2016 "glass of wine a few times/year"  . Drug use: No  . Sexual activity: No   Other Topics Concern  . Not on file   Social History Narrative  . No narrative on file    FAMILY HISTORY: Family History  Problem Relation Age of Onset  . Hypertension Mother   . Osteoporosis Mother   . CVA Mother   . Cancer Neg Hx     ALLERGIES:  is allergic to penicillins; alendronate; and sulfonamide derivatives.  MEDICATIONS:  Current Outpatient Prescriptions  Medication Sig Dispense Refill  . aspirin 81 MG chewable tablet Chew 81 mg by mouth daily.    . feeding supplement, ENSURE ENLIVE, (ENSURE ENLIVE) LIQD Take 237 mLs by mouth 2 (two) times daily between meals. 237 mL 12  . furosemide (LASIX) 20 MG tablet Take 20 mg by mouth daily.  1  . lenalidomide (REVLIMID) 10 MG capsule Take 1 capsule (10 mg total) by mouth daily. 21 capsule 2  . magnesium oxide (MAG-OX) 400 (241.3 Mg) MG tablet Take 1 tablet (400 mg total) by mouth daily. 7 tablet 0  . meclizine (ANTIVERT) 25 MG tablet TAKE ONE TABLET BY MOUTH THREE TIMES DAILY AS NEEDED 90 tablet 0  . megestrol (MEGACE) 40 MG tablet Take 1 tablet (40 mg total) by mouth daily.    . mirtazapine (REMERON) 15 MG tablet Take 1 tablet (15 mg total) by mouth at bedtime. 90 tablet 1  . ondansetron (ZOFRAN ODT) 4 MG disintegrating tablet Take 1-2 tablets (4-8 mg total) by mouth every 8 (eight) hours as needed for nausea or vomiting. 30 tablet 2  . RESTASIS 0.05 % ophthalmic emulsion Place 1 drop into both eyes daily.     . risperiDONE (RISPERDAL) 0.5 MG tablet Take 0.5 mg by mouth 2 (two) times daily.    . sennosides-docusate sodium (SENOKOT-S) 8.6-50 MG tablet Take 2  tablets by mouth daily.    . bisacodyl (DULCOLAX) 10 MG suppository Place 1 suppository (10 mg total) rectally daily as needed for moderate constipation. (Patient not taking: Reported on 06/16/2017) 12 suppository 0  . morphine ([name]) 30 MG 12 hr tablet Take 1 tablet (30 mg total) by mouth every 12 (twelve) hours. 60 tablet 0  . Oxycodone HCl 10 MG TABS Take 1 tablet (10 mg total) by mouth every 6 (six) hours as needed. FOR MODERATE OR SEVERE PAIN 75 tablet 0   No current facility-administered medications for this visit.     REVIEW OF SYSTEMS:  Constitutional: Denies fevers, chills or abnormal night sweats (+) decreased fatigue and increased strength (+) improved appetite Eyes: Denies blurriness of vision, double vision or watery eyes Ears, nose, mouth, throat, and face: Denies mucositis or sore throat  Respiratory: Denies cough, dyspnea or wheezes Cardiovascular: Denies palpitation, chest discomfort  Gastrointestinal:  Denies heartburn (+) mild nausea and diarrhea, today Skin: Denies abnormal skin rashes Lymphatics: Denies new lymphadenopathy or easy bruising  Neurological:Denies numbness, tingling or new weaknesses  Behavioral/Psych: Mood is stable, no new changes  Musculoskeletal: (+) back and right side pain, managed with Morphine  All other systems were reviewed with the patient and are negative.  PHYSICAL EXAMINATION: ECOG PERFORMANCE STATUS: 3 Blood pressure 136/53, heart rate 77, respiratory rate 18, pulse ox 96%, temperature 99.1, weight 146 pounds GENERAL:alert, no distress and comfortable, sitting in wheelchair  SKIN: skin color, texture, turgor are normal, no rashes or significant lesions EYES: normal, conjunctiva are pink and non-injected, sclera clear OROPHARYNX:no exudate, no erythema and lips, buccal mucosa, and tongue normal  NECK: supple, thyroid normal size, non-tender, without nodularity LYMPH:  no palpable lymphadenopathy in the cervical, axillary or  inguinal LUNGS: clear to auscultation and percussion with normal breathing effort HEART: regular rate & rhythm and no murmurs and no lower extremity edema ABDOMEN:abdomen soft, non-tender and normal bowel sounds Musculoskeletal:no cyanosis of digits and no clubbing  PSYCH: alert & oriented x 3 with fluent speech NEURO: no focal motor/sensory deficits  LABORATORY DATA:  I have reviewed  the data as listed CBC Latest Ref Rng & Units 06/16/2017 05/19/2017 04/24/2017  WBC 3.9 - 10.3 10e3/uL 4.3 5.1 2.9(L)  Hemoglobin 11.6 - 15.9 g/dL 10.4(L) 9.8(L) 9.4(L)  Hematocrit 34.8 - 46.6 % 31.9(L) 29.3(L) 28.2(L)  Platelets 145 - 400 10e3/uL 115(L) 256 199   CMP Latest Ref Rng & Units 06/16/2017 05/19/2017 05/19/2017  Glucose 70 - 140 mg/dl 119 145(H) -  BUN 7.0 - 26.0 mg/dL 10.8 10.1 -  Creatinine 0.6 - 1.1 mg/dL 0.9 1.0 -  Sodium 136 - 145 mEq/L 139 139 -  Potassium 3.5 - 5.1 mEq/L 3.9 3.9 -  Chloride 101 - 111 mmol/L - - -  CO2 22 - 29 mEq/L 20(L) 23 -  Calcium 8.4 - 10.4 mg/dL 7.4(L) 8.1(L) -  Total Protein 6.4 - 8.3 g/dL 5.3(L) 5.8(L) 5.1(L)  Total Bilirubin 0.20 - 1.20 mg/dL 0.60 0.73 -  Alkaline Phos 40 - 150 U/L 61 61 -  AST 5 - 34 U/L 13 9 -  ALT 0-55 U/L U/L <6 <6 -    MM LAB: M-protein (g/dl) 10/19/2016: 0.4 11/01/2016: Not Det 12/05/16: Not Det 01/02/17: Not det 02/27/17: Not det 03/24/17: not det.  04/24/17: not det.  05/19/17: not det. 06/16/17:: PENDING    Serum kappa, lamda light chains and ratio (mg/L) 10/21/2016: 14.8, 5362, 0.00 11/01/2016: 1.49, 10.88, 0.14 12/05/2016: 2.16, 19.31, 0.11 01/02/17: 3.01, 10.90, 0.28 02/27/17: 1.49, 20.64, 0.07 03/24/17: 2.19, 3.90, 0.56 04/24/17: 2.58, 4.02, 0.64 05/19/17: 2.22, 3.55, 0.63 06/16/17:: PENDING   24 hour urine (total protein, free lambda, ration) 10/21/2016: 1463, 1570, 0.06   PATHOLOGY  Diagnosis 01/19/2017 Femoral head, fracture, Right - FRACTURE, SEE COMMENT. Microscopic Comment There are scattered plasma cells seen with  CD138, predominately in the area of fracture. Light chain in situ hybridization is non-contributory, likely due to heavy decalcification. Overt plasma cell neoplasm is not appreciated.  Interpretation 10/25/2016 Bone Marrow Flow Cytometry - NO MONOCLONAL B-CELL OR PHENOTYPICALLY ABERRANT T-CELL POPULATION IDENTIFIED.  Diagnosis 10/25/2016 Bone Marrow, Aspirate,Biopsy, and Clot - PLASMA CELL MYELOMA. - SEE COMMENT. PERIPHERAL BLOOD: - NORMOCYTIC ANEMIA. Diagnosis Note Overall, the marrow is hypercellular with increased lambda-restricted plasma cells (62% aspirate, 90% CD138). There is partial expression of CD20, but no monoclonal B-cell population is detected by flow cytometry.     RADIOGRAPHIC STUDIES: I have personally reviewed the radiological images as listed and agreed with the findings in the report. No results found.   DG Hip Unilateral 01/02/17 IMPRESSION: 1. Degenerative changes lumbar spine and both hips. No evidence of fracture or dislocation. 2. Diffuse osteopenia. Multiple tiny lucencies are noted throughout both proximal femurs. These may be secondary to the patient's known multiple myeloma.   ASSESSMENT & PLAN:  83 y.o.woman with HTN and CKD, presented with hypercalcemia, worsening anemia since 06/2016, s/p multiple fall, multiple hospitalization recently for back pain , CT scan showedT2 vertebral body compression fracture with mild height loss.  1. 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 -We previously discussed treatment options.  due to advanced age,  multiple comorbidities, very limited performance status, I do not believe she is a candidate for bone marrow transplant -I previously  recommend her dexamethasone and Revlimid. Start on dexamethasone 76m weekly and Revlimid 158mdaily; 3 weeks on, 1 week off, at adjusted for her renal function. She has been tolerating well, dose increased to 1542mrom cycle 3 -due to her Intermediated risk disease, she may need Velcade. Due to her advanced age and comorbilities, I am concerned about her tolerance to Velcade. I will try RD for a few cycles, if she does not have good response, I will add Velcade.  -The goal of therapy is palliative, for disease control, improve her quality of life and prolong life. - Repeat multiple myeloma panel every 4 weeks  - she has underwent right hip surgery with Dr. MurPercell Miller June 2018, she was off Revlimid for about 6 weeks. - she has been tolerating Revlimid and dexamethasone well -Her M-protein is negative and she is responding well to treatment. Will continue revlimid.  -Given her excellent response to treatment, I will change her very limited to maintenance therapy from next cycle, 10 mg daily, for 3 weeks on and one-week off, and was stopped dexamethasone. This will likely reduce her risk of infection.  -Labs reviewed and she sis sightly anemic, Hg 9.8, but does not need blood transfusion. Her other counts are overall stable. Her M protein has been negative lately and her Lambda light chain has reduced to 39.0. Her disease is well controlled on Revelimid.  -I have changed her to maintenence Revlimid 10 mg daily for 3 weeks, and one-week off. She will continue this until disease progresses.  She understands her multiple myeloma is not curable and physical therapy is disease control.  -Labs reviewed and her plt are 115K related to her Revlimid and her Hg improved to 10.4, continue maintenance Revlimid -f/u in 5 weeks    2. Anemia  -secondary to MM and Revlimid. -She  received blood transfusion in hospital -will monitor CBC closely -consider blood transfusion if Hb<8.0 or symptomatic anemia with Hb 8-9 -Her anemia has slightly improved since he came off Revlimid, hemoglobin 10.3 (02/27/17) -Anemia is 9.4 on (04/24/17), does not need blood  transfusion. We will continue to monitor -HG slightly improved but overall stable, Hg at 9.8 (05/19/17) -Hg still improving, now 10.4 (06/16/17)   3. History of hypercalcemia/hypocalcemia  -She has received one dose of Aredia, hypercalcemia has nearly resolved now. We'll continue every 4 weeks for multiple myeloma, or more frequent if her hypercalcemia gets worse. -Her reached normal limits previously  - I previously encouraged her to take vitD, 800 unit daily  -Her Calcium is now 8.0 (04/24/17) so I suggest she takes some Vitamin D with Calcium 800 mg once a day, I'll increase her calcium to twice daily -calcium stable at 8.1 (05/19/17), no Xgeva was given that day -Calcium has dropped to 7.4 today (06/16/17), No injection today.  -I strongly encouraged her to restart calcium with vitamin D BID and increase dairy intake. We'll resume Xgeva injection next month if her calcium level improves   4. Chronic CKD, stage II -much improved, EGFR 50-60 -Avoid nephrotoxic medicine or contrast   5. Severe back pain and right hip pain from pathologic fracture -T2 compression fracture in the right hip is logical fracture secondary to her multiple myeloma -She will continue [name] every 12 hours and oxycodone as needed -we discussed taking laxative due to increased oxycodone prescription -We discussed using walker and avoid weight bearing on her right hip -Will give referral to orthopedic surgery for further evaluation  -She is fine  to take tylenol, but she should avoid advil due to her MM.  -Pain is controlled with Morphine and oxycodone BID, refill today (06/16/17)  6. HTN -controlled with medication -managed by PCP  7.  Osteoporosis and fractures -Takes Boniva, which has been switched to Aredia  -Due to her poor IV access, I'll switch Aredia to Xgeva every 4 weeks -Started Xgeva 01/02/17, and continue every 4 weeks, tolerating well. She will continue for the next 2 years  8. Goal of care discussion  -We previously discussed the incurable nature of her MM, and the overall poor prognosis, especially if she does not have good response to chemotherapy or progress on chemo -The patient understands the goal of care is palliative. -I recommend DNR/DNI, she will think about it   9. Nausea  - I previously recommended she take some type of nausea medication. She is hesitant, but agreed to try Zofran 4-44m. She can take up to 3x daily. -She has constipation secondary to Zofran -continue Compazine for her nausea  -Overall improved  10. Anorexia  - I have previously encouraged her to eat snacks in between her meals -I previously recommended her to try mirtazapine 7.536mHS, may increase dose in future if needed  -Remeron is working well and her appetite has improved lately.  -Increased mirtazapine to 1532m9/10/18) -Appetite has improved.   11. Swelling in lower extremity, swelling in right arm  -Prescribed Lasix by Dr. WonJacelyn Grip discussed that she can take it as needed to not significantly effect her kidney function, potassium and calcium levels.  -I recommend her to elevate her feet and wear compression socks to help.  -Her right arm swelling is possibly positional (she sleeps on her right side). I suggest sleeping on her left side or back and elevate her right arm, if no improvement, I'll get a Doppler to ruled out DVT. She plans to wait on doppler.     Plan -Refill Oxycodone and [name] today, her pain is controlled -Continue Maintenance Revlimid -Due to her severe hypocalcemia, will hold Xgeva today -Lab, f/u and injection in 5 weeks    All questions were answered. The patient knows to call the clinic  with any problems, questions or concerns.  I spent 20 minutes counseling the patient face to face. The total time spent in the appointment was 30 minutes and more than 50% was on counseling.  This document serves as a record of services personally performed by YanTruitt MerleD. It was created on her behalf by AmoJoslyn Devon trained medical scribe. The creation of this record is based on the scribe's personal observations and the provider's statements to them. This document has been checked and approved by the attending provider.    I have reviewed the above documentation for accuracy and completeness and I agree with the above.   FenTruitt MerleD 06/16/2017

## 2017-06-16 ENCOUNTER — Ambulatory Visit: Payer: Medicare Other

## 2017-06-16 ENCOUNTER — Ambulatory Visit (HOSPITAL_BASED_OUTPATIENT_CLINIC_OR_DEPARTMENT_OTHER): Payer: Medicare Other | Admitting: Hematology

## 2017-06-16 ENCOUNTER — Telehealth: Payer: Self-pay | Admitting: Hematology

## 2017-06-16 ENCOUNTER — Other Ambulatory Visit (HOSPITAL_BASED_OUTPATIENT_CLINIC_OR_DEPARTMENT_OTHER): Payer: Medicare Other

## 2017-06-16 ENCOUNTER — Other Ambulatory Visit: Payer: Self-pay | Admitting: *Deleted

## 2017-06-16 ENCOUNTER — Encounter: Payer: Self-pay | Admitting: Hematology

## 2017-06-16 DIAGNOSIS — M810 Age-related osteoporosis without current pathological fracture: Secondary | ICD-10-CM | POA: Diagnosis not present

## 2017-06-16 DIAGNOSIS — M7989 Other specified soft tissue disorders: Secondary | ICD-10-CM

## 2017-06-16 DIAGNOSIS — Z7189 Other specified counseling: Secondary | ICD-10-CM

## 2017-06-16 DIAGNOSIS — M8000XS Age-related osteoporosis with current pathological fracture, unspecified site, sequela: Secondary | ICD-10-CM

## 2017-06-16 DIAGNOSIS — M4850XA Collapsed vertebra, not elsewhere classified, site unspecified, initial encounter for fracture: Secondary | ICD-10-CM

## 2017-06-16 DIAGNOSIS — D6481 Anemia due to antineoplastic chemotherapy: Secondary | ICD-10-CM

## 2017-06-16 DIAGNOSIS — I10 Essential (primary) hypertension: Secondary | ICD-10-CM | POA: Diagnosis not present

## 2017-06-16 DIAGNOSIS — R630 Anorexia: Secondary | ICD-10-CM | POA: Diagnosis not present

## 2017-06-16 DIAGNOSIS — C9000 Multiple myeloma not having achieved remission: Secondary | ICD-10-CM | POA: Diagnosis not present

## 2017-06-16 DIAGNOSIS — N183 Chronic kidney disease, stage 3 (moderate): Secondary | ICD-10-CM | POA: Diagnosis not present

## 2017-06-16 DIAGNOSIS — M84551D Pathological fracture in neoplastic disease, right femur, subsequent encounter for fracture with routine healing: Secondary | ICD-10-CM

## 2017-06-16 DIAGNOSIS — D630 Anemia in neoplastic disease: Secondary | ICD-10-CM

## 2017-06-16 DIAGNOSIS — R110 Nausea: Secondary | ICD-10-CM | POA: Diagnosis not present

## 2017-06-16 LAB — CBC WITH DIFFERENTIAL/PLATELET
BASO%: 1 % (ref 0.0–2.0)
BASOS ABS: 0 10*3/uL (ref 0.0–0.1)
EOS%: 1.2 % (ref 0.0–7.0)
Eosinophils Absolute: 0.1 10*3/uL (ref 0.0–0.5)
HEMATOCRIT: 31.9 % — AB (ref 34.8–46.6)
HEMOGLOBIN: 10.4 g/dL — AB (ref 11.6–15.9)
LYMPH#: 1.3 10*3/uL (ref 0.9–3.3)
LYMPH%: 29.1 % (ref 14.0–49.7)
MCH: 32.6 pg (ref 25.1–34.0)
MCHC: 32.7 g/dL (ref 31.5–36.0)
MCV: 99.9 fL (ref 79.5–101.0)
MONO#: 0.2 10*3/uL (ref 0.1–0.9)
MONO%: 5.5 % (ref 0.0–14.0)
NEUT%: 63.2 % (ref 38.4–76.8)
NEUTROS ABS: 2.7 10*3/uL (ref 1.5–6.5)
Platelets: 115 10*3/uL — ABNORMAL LOW (ref 145–400)
RBC: 3.19 10*6/uL — AB (ref 3.70–5.45)
RDW: 17.7 % — AB (ref 11.2–14.5)
WBC: 4.3 10*3/uL (ref 3.9–10.3)

## 2017-06-16 LAB — COMPREHENSIVE METABOLIC PANEL
ALT: <6 U/L (ref 0–55)
ANION GAP: 8 meq/L (ref 3–11)
AST: 13 U/L (ref 5–34)
Albumin: 2.8 g/dL — ABNORMAL LOW (ref 3.5–5.0)
Alkaline Phosphatase: 61 U/L (ref 40–150)
BUN: 10.8 mg/dL (ref 7.0–26.0)
CHLORIDE: 111 meq/L — AB (ref 98–109)
CO2: 20 meq/L — AB (ref 22–29)
Calcium: 7.4 mg/dL — ABNORMAL LOW (ref 8.4–10.4)
Creatinine: 0.9 mg/dL (ref 0.6–1.1)
Glucose: 119 mg/dl (ref 70–140)
Potassium: 3.9 mEq/L (ref 3.5–5.1)
Sodium: 139 mEq/L (ref 136–145)
Total Bilirubin: 0.6 mg/dL (ref 0.20–1.20)
Total Protein: 5.3 g/dL — ABNORMAL LOW (ref 6.4–8.3)

## 2017-06-16 MED ORDER — MORPHINE SULFATE ER 30 MG PO TBCR: 30.0000 mg | EXTENDED_RELEASE_TABLET | Freq: Two times a day (BID) | ORAL | 0 refills | Status: DC

## 2017-06-16 MED ORDER — OXYCODONE HCL 10 MG PO TABS: 10.0000 mg | ORAL_TABLET | Freq: Four times a day (QID) | ORAL | 0 refills | Status: DC | PRN

## 2017-06-16 NOTE — Telephone Encounter (Signed)
Gave avs and calendar for December

## 2017-06-19 LAB — KAPPA/LAMBDA LIGHT CHAINS
IG LAMBDA FREE LIGHT CHAIN: 55.5 mg/L — AB (ref 5.7–26.3)
Ig Kappa Free Light Chain: 41.8 mg/L — ABNORMAL HIGH (ref 3.3–19.4)
KAPPA/LAMBDA FLC RATIO: 0.75 (ref 0.26–1.65)

## 2017-06-20 LAB — MULTIPLE MYELOMA PANEL, SERUM
ALBUMIN/GLOB SERPL: 1.2 (ref 0.7–1.7)
ALPHA 1: 0.2 g/dL (ref 0.0–0.4)
ALPHA2 GLOB SERPL ELPH-MCNC: 0.6 g/dL (ref 0.4–1.0)
Albumin SerPl Elph-Mcnc: 2.9 g/dL (ref 2.9–4.4)
B-GLOBULIN SERPL ELPH-MCNC: 0.8 g/dL (ref 0.7–1.3)
GLOBULIN, TOTAL: 2.5 g/dL (ref 2.2–3.9)
Gamma Glob SerPl Elph-Mcnc: 0.9 g/dL (ref 0.4–1.8)
IGG (IMMUNOGLOBIN G), SERUM: 894 mg/dL (ref 700–1600)
IGM (IMMUNOGLOBIN M), SRM: 23 mg/dL — AB (ref 26–217)
IgA, Qn, Serum: 270 mg/dL (ref 64–422)
Total Protein: 5.4 g/dL — ABNORMAL LOW (ref 6.0–8.5)

## 2017-07-11 ENCOUNTER — Other Ambulatory Visit: Payer: Self-pay | Admitting: *Deleted

## 2017-07-11 MED ORDER — LENALIDOMIDE 10 MG PO CAPS: 10.0000 mg | ORAL_CAPSULE | Freq: Every day | ORAL | 0 refills | Status: DC

## 2017-07-20 ENCOUNTER — Other Ambulatory Visit: Payer: Self-pay | Admitting: Hematology

## 2017-07-20 NOTE — Progress Notes (Signed)
[place]  [phone] [phone]  Clinic Follow Up Note   Patient Care Team: [name], MD as PCP - General (Family Medicine)   Date of Service: 07/21/2017  CHIEF COMPLAINTS:  Follow up Multiple Myeloma      Multiple myeloma ([place])   10/18/2016 - 10/26/2016 Hospital Admission    Pt was admitted for symptomatic hypercalcemia, AKI, bone marrow biopsy done during hospital stay, and she received Aredia       10/19/2016 Tumor Marker    SPEP showed M-protein 0.4g/dl, serum lambda light chain 5362, ratio 0.00      10/25/2016 Bone Marrow Biopsy     Overall, the marrow is hypercellular with increased lambda-restricted plasma cells (62% aspirate, 90% CD138). There is partial expression of CD20, but no monoclonal B-cell population is detected by flow cytometry.       10/25/2016 Initial Diagnosis    Multiple myeloma ([place])      10/25/2016 Miscellaneous    Cytogenetics and FISH showed loss of 13q, which is intermediate risk for MM       11/10/2016 -  Chemotherapy    Revlimid 10 mg (increased to 17m on cycle 2) daily, 3 weeks on, and one week off, dexamethasone 20 mg weekly, was off from early June to mid July 2018 due to her right hip fracture and hospitalization.  Changed to maintenence Revlimid 18mand stopped dexamethasone starting next cycle, 06/10/17         01/02/2017 Imaging     DG Hip Unilat w or w/o Pelvis IMPRESSION 1. Degenerative changes lumbar spine and both hips. No evidence of fracture or dislocation.  2. Diffuse osteopenia. Multiple tiny lucencies are noted throughout both proximal femurs. These may be secondary to the patient's known multiple myeloma.      01/18/2017 Imaging     CT hip right wo contrast  IMPRESSION: Findings consistent with a pathologic fracture of the right femoral neck. A large lytic lesion in the superior aspect of the lateral femoral head is identified.  Possible nondisplaced fracture of the  right inferior pubic ramus.  Diffuse myeloma.      01/18/2017 - 01/23/2017 Hospital Admission    She present to the ED with complaints of severe right hip pain. she recieved a right hip hemiarthroplasty with Dr. MuPercell Miller     HISTORY OF PRESENTING ILLNESS (10/20/2016):  [age]ear old African-American female, with past medical history of hypertension, chronic back pain, [name] stage III, who presents with altered mental status. She was admitted for hypercalcemia. I was called for consult to rule out multiple myeloma.   The patient is joined by family today. They help to report the patient's history. The patient has been lethargic and confused for the past week. She has issues with mobility at this time. She is constipated.   The family reports the patient fell last October, and her PCP suspected she broke her back. The patient received a cortisone injection in January, which was followed by onset of pain and decreased mobility. The patient was subsequently admitted to [name], where the family reports they discovered she was being given someone else's medicine. This led to the patient's confused mental status and difficulty with ambulation. The patient fell again while in [name], and she was brought back to home by her family. She has 24/7 care at home. In the ED, she was found to have calcium more than 15, she was admitted for further management. She received calcitonin, her  calcium came down to 12.7 today. Her SPEP was positive for M protein-0.4 g/dl, possible represent a monoclonal gammopathy. Her 24-hour urine light chains are pending. Her PTH was normal at 20, PTH-rp is pending.   CURRENT THERAPY:  1. RD (Revlimid 10 mg daily, increased to 82m from cycle 3, 3 weeks on, one week off, and weekly dexamethasone 225m, started on 11/10/2016. 2.Change to maintenance Revlimid 107maily for 3 weeks on and 1 week off, and stopped dexamethasone started on 06/10/17 3. Aredia 72m23mvery 4 weeks, started on 10/24/2016 for hypercalcemia. Switch to Xgeva injections every 4 weeks on 01/02/17  INTERIM HISTORY: ChriLEYNA VANDERKOLKa 86 y37. female here for follow up accompanied by her daughter.  She notes she is doing well. Last two days she had swelling in her right arm. She has trouble getting her blood drawn, she was stuck 3 times today. Today went better than prior times. She has lower extremity swelling. She notes mostly having left lower back pain. She takes heating pad that helps. Her right hip is not hurting much anymore. She is eating well. She takes calcium BID and tries to drink enough water.     MEDICAL HISTORY:  Past Medical History:  Diagnosis Date  . Allergy   . Arthritis   . Cancer (HCC)[place]. Chronic kidney disease (CKD), stage III (moderate) (HCC)[place] /notArchie Endo/2018  . Depression    Risperdal "twice/day" (09/23/2016)  . Glaucoma   . Hiatal hernia   . Hypertension   . Intractable back pain    /notes 09/23/2016  . Obesity   . Osteoporosis    vertebral fracture in 2012  . Upper GI bleed 04/2004   secondary to Mallory-Weiss tear/notes 12/28/2010  . Vertigo     SURGICAL HISTORY: Past Surgical History:  Procedure Laterality Date  . ABDOMINAL HYSTERECTOMY  1974   partial  . APPENDECTOMY     as a child/notes 12/28/2010  . BACK SURGERY    . BREAST BIOPSY Left 1999   /notArchie Endo5/2012  . CATARACT EXTRACTION, BILATERAL Bilateral 2000s  . ESOPHAGOGASTRODUODENOSCOPY  04/2004   /notArchie Endo5/2012  . HIP ARTHROPLASTY Right 01/19/2017   Procedure: RIGHT HIP HEMIARTHROPLASTY;  Surgeon: MurpRenette Butters;  [facility];  Laterality: Right;  . KYPHOPLASTY  05/2011   T10/notes 05/25/2011  . LAPAROSCOPIC CHOLECYSTECTOMY  10/2007   /notArchie Endo/2012    SOCIAL HISTORY: Social History   Socioeconomic History  . Marital status: Widowed    Spouse name: Not on file  . Number of children: Not on file  . Years of education: Not on file    . Highest education level: Not on file  Social Needs  . Financial resource strain: Not on file  . Food insecurity - worry: Not on file  . Food insecurity - inability: Not on file  . Transportation needs - medical: Not on file  . Transportation needs - non-medical: Not on file  Occupational History  . Not on file  Tobacco Use  . Smoking status: Never Smoker  . Smokeless tobacco: Never Used  Substance and Sexual Activity  . Alcohol use: Yes    Comment: 09/23/2016 "glass of wine a few times/year"  . Drug use: No  . Sexual activity: No  Other Topics Concern  . Not on file  Social History Narrative  . Not on file    FAMILY HISTORY: Family History  Problem Relation Age of Onset  .  Hypertension Mother   . Osteoporosis Mother   . CVA Mother   . Cancer Neg Hx     ALLERGIES:  is allergic to penicillins; alendronate; and sulfonamide derivatives.  MEDICATIONS:  Current Outpatient Medications  Medication Sig Dispense Refill  . aspirin 81 MG chewable tablet Chew 81 mg by mouth daily.    . feeding supplement, ENSURE ENLIVE, (ENSURE ENLIVE) LIQD Take 237 mLs by mouth 2 (two) times daily between meals. 237 mL 12  . furosemide (LASIX) 20 MG tablet Take 20 mg by mouth daily.  1  . lenalidomide (REVLIMID) 10 MG capsule Take 1 capsule (10 mg total) by mouth daily. 21 capsule 0  . magnesium oxide (MAG-OX) 400 (241.3 Mg) MG tablet Take 1 tablet (400 mg total) by mouth daily. 7 tablet 0  . meclizine (ANTIVERT) 25 MG tablet TAKE ONE TABLET BY MOUTH THREE TIMES DAILY AS NEEDED 90 tablet 0  . megestrol (MEGACE) 40 MG tablet Take 1 tablet (40 mg total) by mouth daily.    . mirtazapine (REMERON) 15 MG tablet Take 1 tablet (15 mg total) by mouth at bedtime. 90 tablet 1  . morphine ([name]) 30 MG 12 hr tablet Take 1 tablet (30 mg total) by mouth every 12 (twelve) hours. 60 tablet 0  . ondansetron (ZOFRAN ODT) 4 MG disintegrating tablet Take 1-2 tablets (4-8 mg total) by mouth every 8 (eight) hours  as needed for nausea or vomiting. 30 tablet 2  . Oxycodone HCl 10 MG TABS Take 1 tablet (10 mg total) by mouth every 6 (six) hours as needed. FOR MODERATE OR SEVERE PAIN 75 tablet 0  . RESTASIS 0.05 % ophthalmic emulsion Place 1 drop into both eyes daily.     . risperiDONE (RISPERDAL) 0.5 MG tablet Take 0.5 mg by mouth 2 (two) times daily.    . sennosides-docusate sodium (SENOKOT-S) 8.6-50 MG tablet Take 2 tablets by mouth daily.     No current facility-administered medications for this visit.     REVIEW OF SYSTEMS:  Constitutional: Denies fevers, chills or abnormal night sweats  Eyes: Denies blurriness of vision, double vision or watery eyes Ears, nose, mouth, throat, and face: Denies mucositis or sore throat  Respiratory: Denies cough, dyspnea or wheezes Cardiovascular: Denies palpitation, chest discomfort (+) mild lower extremity swelling and right arm swelling Gastrointestinal:  Denies heartburn Skin: Denies abnormal skin rashes Lymphatics: Denies new lymphadenopathy or easy bruising  Neurological:Denies numbness, tingling or new weaknesses  Behavioral/Psych: Mood is stable, no new changes  Musculoskeletal: (+) left lower back and improved right side pain, managed with Morphine  All other systems were reviewed with the patient and are negative.  PHYSICAL EXAMINATION: ECOG PERFORMANCE STATUS: 3 Vitals:   07/21/17 1345  BP: (!) 152/60  Pulse: 84  Resp: 17  Temp: 98.4 F (36.9 C)  TempSrc: Oral  SpO2: 99%  Weight: 148 lb 12.8 oz (67.5 kg)  Height: 4' 9"  (1.448 m)     GENERAL:alert, no distress and comfortable, sitting in wheelchair  SKIN: skin color, texture, turgor are normal, no rashes or significant lesions EYES: normal, conjunctiva are pink and non-injected, sclera clear OROPHARYNX:no exudate, no erythema and lips, buccal mucosa, and tongue normal  NECK: supple, thyroid normal size, non-tender, without nodularity LYMPH:  no palpable lymphadenopathy in the cervical,  axillary or inguinal LUNGS: clear to auscultation and percussion with normal breathing effort HEART: regular rate & rhythm and no murmurs and no lower extremity edema ABDOMEN:abdomen soft, non-tender and normal bowel sounds  Musculoskeletal:no cyanosis of digits and no clubbing  PSYCH: alert & oriented x 3 with fluent speech NEURO: no focal motor/sensory deficits  LABORATORY DATA:  I have reviewed the data as listed CBC Latest Ref Rng & Units 07/21/2017 06/16/2017 05/19/2017  WBC 3.9 - 10.3 10e3/uL 4.6 4.3 5.1  Hemoglobin 11.6 - 15.9 g/dL 10.5(L) 10.4(L) 9.8(L)  Hematocrit 34.8 - 46.6 % 31.3(L) 31.9(L) 29.3(L)  Platelets 145 - 400 10e3/uL 174 115(L) 256   CMP Latest Ref Rng & Units 07/21/2017 06/16/2017 06/16/2017  Glucose 70 - 140 mg/dl 105 119 -  BUN 7.0 - 26.0 mg/dL 13.1 10.8 -  Creatinine 0.6 - 1.1 mg/dL 1.0 0.9 -  Sodium 136 - 145 mEq/L 140 139 -  Potassium 3.5 - 5.1 mEq/L 3.6 3.9 -  Chloride 101 - 111 mmol/L - - -  CO2 22 - 29 mEq/L 23 20(L) -  Calcium 8.4 - 10.4 mg/dL 8.5 7.4(L) -  Total Protein 6.4 - 8.3 g/dL 6.1(L) 5.3(L) 5.4(L)  Total Bilirubin 0.20 - 1.20 mg/dL 0.50 0.60 -  Alkaline Phos 40 - 150 U/L 71 61 -  AST 5 - 34 U/L 12 13 -  ALT 0-55 U/L U/L <6 <6 -    MM LAB: M-protein (g/dl) 10/19/2016: 0.4 11/01/2016: Not Det 12/05/16: Not Det 01/02/17: Not det 02/27/17: Not det 03/24/17: not det.  04/24/17: not det.  05/19/17: not det. 06/16/17:: not det 07/21/17: PENDING   Serum kappa, lamda light chains and ratio (mg/L) 10/21/2016: 14.8, 5362, 0.00 11/01/2016: 1.49, 10.88, 0.14 12/05/2016: 2.16, 19.31, 0.11 01/02/17: 3.01, 10.90, 0.28 02/27/17: 1.49, 20.64, 0.07 03/24/17: 2.19, 3.90, 0.56 04/24/17: 2.58, 4.02, 0.64 05/19/17: 2.22, 3.55, 0.63 06/16/17:: 4.18, 5.55, 0.75 07/21/17: PENDING   24 hour urine (total protein, free lambda, ration) 10/21/2016: 1463, 1570, 0.06   PATHOLOGY  Diagnosis 01/19/2017 Femoral head, fracture, Right - FRACTURE, SEE COMMENT. Microscopic  Comment There are scattered plasma cells seen with CD138, predominately in the area of fracture. Light chain in situ hybridization is non-contributory, likely due to heavy decalcification. Overt plasma cell neoplasm is not appreciated.  Interpretation 10/25/2016 Bone Marrow Flow Cytometry - NO MONOCLONAL B-CELL OR PHENOTYPICALLY ABERRANT T-CELL POPULATION IDENTIFIED.  Diagnosis 10/25/2016 Bone Marrow, Aspirate,Biopsy, and Clot - PLASMA CELL MYELOMA. - SEE COMMENT. PERIPHERAL BLOOD: - NORMOCYTIC ANEMIA. Diagnosis Note Overall, the marrow is hypercellular with increased lambda-restricted plasma cells (62% aspirate, 90% CD138). There is partial expression of CD20, but no monoclonal B-cell population is detected by flow cytometry.     RADIOGRAPHIC STUDIES: I have personally reviewed the radiological images as listed and agreed with the findings in the report. No results found.   DG Hip Unilateral 01/02/17 IMPRESSION: 1. Degenerative changes lumbar spine and both hips. No evidence of fracture or dislocation. 2. Diffuse osteopenia. Multiple tiny lucencies are noted throughout both proximal femurs. These may be secondary to the patient's known multiple myeloma.   ASSESSMENT & PLAN:  83 y.o.woman with HTN and CKD, presented with hypercalcemia, worsening anemia since 06/2016, s/p multiple fall, multiple hospitalization recently for back pain , CT scan showedT2 vertebral body compression fracture with mild height loss.  1. Multiple Myeloma, lambda light chain disease, staging III, intermediate risk (13q-) -I previously reviewed the bone marrow biopsy results with the patient and her family, along with the scan findings  -she presented with hypercalcemia, anemia, mild renal failure and multiple fractures after fall -I previously reviewed her cytogenetics and Fish testing results, which showed chromosome 13 every deletion, which is at intermediate  risk for multiple myeloma. -We  previously discussed treatment options.  due to advanced age, multiple comorbidities, very limited performance status, I do not believe she is a candidate for bone marrow transplant -I previously recommend her dexamethasone and Revlimid. Start on dexamethasone 72m weekly and Revlimid 176mdaily; 3 weeks on, 1 week off, at adjusted for her renal function. She has been tolerating well, dose increased to 1563mrom cycle 3 -due to her Intermediated risk disease, she may need Velcade. Due to her advanced age and comorbidities, I am concerned about her tolerance to Velcade. I will try RD for a few cycles, if she does not have good response, I will add Velcade.  -The goal of therapy is palliative, for disease control, improve her quality of life and prolong life. - Repeat multiple myeloma panel every 4 weeks  - she has underwent right hip surgery with Dr. MurPercell Miller June 2018, she was off Revlimid for about 6 weeks. - she has been tolerating Revlimid and dexamethasone well -Her M-protein is negative and she is responding well to treatment. Will continue Revlimid.  -Given her excellent response to treatment, I will change her very limited to maintenance therapy from next cycle, 10 mg daily, for 3 weeks on and one-week off, and was stopped dexamethasone. This will likely reduce her risk of infection.  -Labs reviewed and she sis sightly anemic, Hg 9.8, but does not need blood transfusion. Her other counts are overall stable. Her M protein has been negative lately and her Lambda light chain has reduced to 39.0. Her disease is well controlled on Revlimid.  -I have changed her to maintenence Revlimid 10 mg daily for 3 weeks, and one-week off. She will continue this until disease progresses.  She understands her multiple myeloma is not curable and the goal of therapy is disease control.  -Labs reviewed, potassium normalized, Hg 10.5. Her M-protein panel is still pending. She will continue Revlimid Maintenance. She is  clinically doing well  -If her response remains good and her labs stable I will space out her labs and visits.  She has difficult vein access -F/u in 4 weeks     2. Anemia  -secondary to MM and Revlimid. -She received blood transfusion in hospital -will monitor CBC closely -consider blood transfusion if Hb<8.0 or symptomatic anemia with Hb 8-9 -Her anemia has slightly improved since he came off Revlimid, hemoglobin 10.3 (02/27/17) -Anemia is 9.4 on (04/24/17), does not need blood  transfusion. We will continue to monitor -HG slightly improved but overall stable, Hg at 9.8 (05/19/17) -Hg still improving, now 10.4 (06/16/17)  -Stable   3. History of hypercalcemia/hypocalcemia  -She has received one dose of Aredia, hypercalcemia has nearly resolved now. We'll continue every 4 weeks for multiple myeloma, or more frequent if her hypercalcemia gets worse. -Her reached normal limits previously  - I previously encouraged her to take vitD, 800 unit daily  -Her Calcium is now 8.0 (04/24/17) so I suggest she takes some Vitamin D with Calcium 800 mg once a day, I'll increase her calcium to twice daily -calcium stable at 8.1 (05/19/17), no Xgeva was given that day -Calcium has dropped to 7.4 on (06/16/17), No injection was given that day. -I strongly encouraged her to restart calcium with vitamin D BID and increase dairy intake. We'll resume Xgeva injection next month if her calcium level improves -She has responded well to calcium BID, calcium now 8.5 today (07/21/17) and will receive Xgeva injection    4. Chronic  CKD, stage II -much improved, EGFR 50-60 -Avoid nephrotoxic medicine or contrast   5. Severe back pain and right hip pain from pathologic fracture -T2 compression fracture in the right hip is logical fracture secondary to her multiple myeloma -She will continue [name] every 12 hours and oxycodone as needed -we discussed taking laxative due to increased oxycodone prescription -We discussed  using walker and avoid weight bearing on her right hip -Will give referral to orthopedic surgery for further evaluation  -She is fine to take tylenol, but she should avoid advil due to her MM.  -Pain is controlled with Morphine and oxycodone BID, refill on (06/16/17)  6. HTN -controlled with medication -managed by PCP  7. Osteoporosis and fractures -Takes Boniva, which has been switched to Aredia  -Due to her poor IV access, I'll switch Aredia to Xgeva every 4 weeks -Started Xgeva 01/02/17, and continue every 4 weeks, tolerating well. She will continue for the next 2 years  8. Goal of care discussion  -We previously discussed the incurable nature of her MM, and the overall poor prognosis, especially if she does not have good response to chemotherapy or progress on chemo -The patient understands the goal of care is palliative. -I recommend DNR/DNI, she will think about it   9. Nausea  - I previously recommended she take some type of nausea medication. She is hesitant, but agreed to try Zofran 4-17m. She can take up to 3x daily. -She has constipation secondary to Zofran -continue Compazine for her nausea  -Overall much improved  10. Anorexia  - I have previously encouraged her to eat snacks in between her meals -I previously recommended her to try mirtazapine 7.538mHS, may increase dose in future if needed  -Remeron is working well and her appetite has improved lately.  -Increased mirtazapine to 1531m9/10/18) -Appetite has improved.   11. Swelling in lower extremity, swelling in right arm  -Prescribed Lasix by Dr. WonJacelyn Grip discussed that she can take it as needed to not significantly effect her kidney function, potassium and calcium levels.  -I recommend her to elevate her feet and wear compression socks to help.  -Her right arm swelling is possibly positional (she sleeps on her right side). I suggest sleeping on her left side or back and elevate her right arm, if no improvement,  I'll get a Doppler to ruled out DVT. She plans to wait on doppler.   -Will monitor. Overall stable   Plan -Labs reviewed, calcium is 8.5, Xgeva injection today and continue every 4 weeks  -Lab and f/u in 4 weeks    All questions were answered. The patient knows to call the clinic with any problems, questions or concerns.  I spent 20 minutes counseling the patient face to face. The total time spent in the appointment was 30 minutes and more than 50% was on counseling.  This document serves as a record of services personally performed by YanTruitt MerleD. It was created on her behalf by AmoJoslyn Devon trained medical scribe. The creation of this record is based on the scribe's personal observations and the provider's statements to them.   I have reviewed the above documentation for accuracy and completeness, and I agree with the above.    FenTruitt MerleD 07/21/2017

## 2017-07-21 ENCOUNTER — Ambulatory Visit (HOSPITAL_BASED_OUTPATIENT_CLINIC_OR_DEPARTMENT_OTHER): Payer: Medicare Other | Admitting: Hematology

## 2017-07-21 ENCOUNTER — Encounter: Payer: Self-pay | Admitting: Hematology

## 2017-07-21 ENCOUNTER — Ambulatory Visit (HOSPITAL_BASED_OUTPATIENT_CLINIC_OR_DEPARTMENT_OTHER): Payer: Medicare Other

## 2017-07-21 ENCOUNTER — Other Ambulatory Visit (HOSPITAL_BASED_OUTPATIENT_CLINIC_OR_DEPARTMENT_OTHER): Payer: Medicare Other

## 2017-07-21 ENCOUNTER — Telehealth: Payer: Self-pay | Admitting: Hematology

## 2017-07-21 VITALS — BP 152/60 | HR 84 | Temp 98.4°F | Resp 17 | Ht <= 58 in | Wt 148.8 lb

## 2017-07-21 DIAGNOSIS — I10 Essential (primary) hypertension: Secondary | ICD-10-CM

## 2017-07-21 DIAGNOSIS — M545 Low back pain: Secondary | ICD-10-CM | POA: Diagnosis not present

## 2017-07-21 DIAGNOSIS — D630 Anemia in neoplastic disease: Secondary | ICD-10-CM | POA: Diagnosis not present

## 2017-07-21 DIAGNOSIS — M7989 Other specified soft tissue disorders: Secondary | ICD-10-CM

## 2017-07-21 DIAGNOSIS — C9000 Multiple myeloma not having achieved remission: Secondary | ICD-10-CM | POA: Diagnosis not present

## 2017-07-21 DIAGNOSIS — M810 Age-related osteoporosis without current pathological fracture: Secondary | ICD-10-CM | POA: Diagnosis not present

## 2017-07-21 DIAGNOSIS — D6481 Anemia due to antineoplastic chemotherapy: Secondary | ICD-10-CM | POA: Diagnosis not present

## 2017-07-21 DIAGNOSIS — M8000XS Age-related osteoporosis with current pathological fracture, unspecified site, sequela: Secondary | ICD-10-CM

## 2017-07-21 DIAGNOSIS — Z7189 Other specified counseling: Secondary | ICD-10-CM | POA: Diagnosis not present

## 2017-07-21 DIAGNOSIS — N182 Chronic kidney disease, stage 2 (mild): Secondary | ICD-10-CM

## 2017-07-21 LAB — COMPREHENSIVE METABOLIC PANEL
ANION GAP: 10 meq/L (ref 3–11)
AST: 12 U/L (ref 5–34)
Albumin: 3.3 g/dL — ABNORMAL LOW (ref 3.5–5.0)
Alkaline Phosphatase: 71 U/L (ref 40–150)
BILIRUBIN TOTAL: 0.5 mg/dL (ref 0.20–1.20)
BUN: 13.1 mg/dL (ref 7.0–26.0)
CHLORIDE: 107 meq/L (ref 98–109)
CO2: 23 meq/L (ref 22–29)
CREATININE: 1 mg/dL (ref 0.6–1.1)
Calcium: 8.5 mg/dL (ref 8.4–10.4)
EGFR: 57 mL/min/{1.73_m2} — ABNORMAL LOW (ref >60)
GLUCOSE: 105 mg/dL (ref 70–140)
Potassium: 3.6 mEq/L (ref 3.5–5.1)
SODIUM: 140 meq/L (ref 136–145)
TOTAL PROTEIN: 6.1 g/dL — AB (ref 6.4–8.3)

## 2017-07-21 LAB — CBC WITH DIFFERENTIAL/PLATELET
BASO%: 0.2 % (ref 0.0–2.0)
BASOS ABS: 0 10*3/uL (ref 0.0–0.1)
EOS ABS: 0.3 10*3/uL (ref 0.0–0.5)
EOS%: 5.7 % (ref 0.0–7.0)
HEMATOCRIT: 31.3 % — AB (ref 34.8–46.6)
HEMOGLOBIN: 10.5 g/dL — AB (ref 11.6–15.9)
LYMPH%: 37.9 % (ref 14.0–49.7)
MCH: 33.4 pg (ref 25.1–34.0)
MCHC: 33.5 g/dL (ref 31.5–36.0)
MCV: 99.7 fL (ref 79.5–101.0)
MONO#: 0.3 10*3/uL (ref 0.1–0.9)
MONO%: 7 % (ref 0.0–14.0)
NEUT#: 2.3 10*3/uL (ref 1.5–6.5)
NEUT%: 49.2 % (ref 38.4–76.8)
Platelets: 174 10*3/uL (ref 145–400)
RBC: 3.14 10*6/uL — ABNORMAL LOW (ref 3.70–5.45)
RDW: 16.2 % — AB (ref 11.2–14.5)
WBC: 4.6 10*3/uL (ref 3.9–10.3)
lymph#: 1.7 10*3/uL (ref 0.9–3.3)

## 2017-07-21 MED ORDER — DENOSUMAB 120 MG/1.7ML ~~LOC~~ SOLN
120.0000 mg | Freq: Once | SUBCUTANEOUS | Status: AC
  Administered 2017-07-21: 120 mg via SUBCUTANEOUS

## 2017-07-21 MED ORDER — DENOSUMAB 120 MG/1.7ML ~~LOC~~ SOLN
SUBCUTANEOUS | Status: AC
  Filled 2017-07-21: qty 1.7

## 2017-07-21 NOTE — Patient Instructions (Signed)

## 2017-07-21 NOTE — Telephone Encounter (Signed)
Gave avs and calendar for January 2019

## 2017-07-24 LAB — MULTIPLE MYELOMA PANEL, SERUM
ALPHA 1: 0.2 g/dL (ref 0.0–0.4)
ALPHA2 GLOB SERPL ELPH-MCNC: 0.5 g/dL (ref 0.4–1.0)
Albumin SerPl Elph-Mcnc: 3.5 g/dL (ref 2.9–4.4)
Albumin/Glob SerPl: 1.5 (ref 0.7–1.7)
B-Globulin SerPl Elph-Mcnc: 0.8 g/dL (ref 0.7–1.3)
Gamma Glob SerPl Elph-Mcnc: 0.9 g/dL (ref 0.4–1.8)
Globulin, Total: 2.4 g/dL (ref 2.2–3.9)
IGA/IMMUNOGLOBULIN A, SERUM: 323 mg/dL (ref 64–422)
IGG (IMMUNOGLOBIN G), SERUM: 999 mg/dL (ref 700–1600)
IGM (IMMUNOGLOBIN M), SRM: 24 mg/dL — AB (ref 26–217)
TOTAL PROTEIN: 5.9 g/dL — AB (ref 6.0–8.5)

## 2017-07-24 LAB — KAPPA/LAMBDA LIGHT CHAINS
Ig Kappa Free Light Chain: 38.6 mg/L — ABNORMAL HIGH (ref 3.3–19.4)
Ig Lambda Free Light Chain: 51 mg/L — ABNORMAL HIGH (ref 5.7–26.3)
Kappa/Lambda FluidC Ratio: 0.76 (ref 0.26–1.65)

## 2017-07-25 ENCOUNTER — Telehealth: Payer: Self-pay

## 2017-07-25 NOTE — Telephone Encounter (Signed)
12/7 inbasket stated to add an inj and no need to call pt,done  Gregory Dowe

## 2017-08-02 ENCOUNTER — Other Ambulatory Visit: Payer: Self-pay | Admitting: *Deleted

## 2017-08-02 DIAGNOSIS — C9000 Multiple myeloma not having achieved remission: Secondary | ICD-10-CM

## 2017-08-02 MED ORDER — LENALIDOMIDE 10 MG PO CAPS: 10.0000 mg | ORAL_CAPSULE | Freq: Every day | ORAL | 0 refills | Status: DC

## 2017-08-14 ENCOUNTER — Other Ambulatory Visit: Payer: Self-pay | Admitting: *Deleted

## 2017-08-14 DIAGNOSIS — C9000 Multiple myeloma not having achieved remission: Secondary | ICD-10-CM

## 2017-08-14 MED ORDER — MORPHINE SULFATE ER 30 MG PO TBCR: 30.0000 mg | EXTENDED_RELEASE_TABLET | Freq: Two times a day (BID) | ORAL | 0 refills | Status: DC

## 2017-08-14 MED ORDER — OXYCODONE HCL 10 MG PO TABS: 10.0000 mg | ORAL_TABLET | Freq: Four times a day (QID) | ORAL | 0 refills | Status: DC | PRN

## 2017-08-14 NOTE — Telephone Encounter (Signed)
Received call from pt's daughter stating that mother needs refill on pain meds.  Will call when ready.

## 2017-08-18 ENCOUNTER — Ambulatory Visit (HOSPITAL_BASED_OUTPATIENT_CLINIC_OR_DEPARTMENT_OTHER): Payer: Medicare Other | Admitting: Nurse Practitioner

## 2017-08-18 ENCOUNTER — Encounter: Payer: Self-pay | Admitting: Nurse Practitioner

## 2017-08-18 ENCOUNTER — Ambulatory Visit (HOSPITAL_BASED_OUTPATIENT_CLINIC_OR_DEPARTMENT_OTHER): Payer: Medicare Other

## 2017-08-18 ENCOUNTER — Telehealth: Payer: Self-pay | Admitting: Nurse Practitioner

## 2017-08-18 ENCOUNTER — Other Ambulatory Visit (HOSPITAL_BASED_OUTPATIENT_CLINIC_OR_DEPARTMENT_OTHER): Payer: Medicare Other

## 2017-08-18 VITALS — BP 161/66 | HR 74 | Temp 98.4°F | Resp 20 | Ht <= 58 in | Wt 142.1 lb

## 2017-08-18 DIAGNOSIS — D649 Anemia, unspecified: Secondary | ICD-10-CM | POA: Diagnosis not present

## 2017-08-18 DIAGNOSIS — C9000 Multiple myeloma not having achieved remission: Secondary | ICD-10-CM | POA: Diagnosis not present

## 2017-08-18 DIAGNOSIS — N182 Chronic kidney disease, stage 2 (mild): Secondary | ICD-10-CM

## 2017-08-18 DIAGNOSIS — M810 Age-related osteoporosis without current pathological fracture: Secondary | ICD-10-CM | POA: Diagnosis not present

## 2017-08-18 DIAGNOSIS — I10 Essential (primary) hypertension: Secondary | ICD-10-CM | POA: Diagnosis not present

## 2017-08-18 DIAGNOSIS — D630 Anemia in neoplastic disease: Secondary | ICD-10-CM

## 2017-08-18 LAB — COMPREHENSIVE METABOLIC PANEL
ALK PHOS: 58 U/L (ref 40–150)
ALT: 8 U/L (ref 0–55)
AST: 12 U/L (ref 5–34)
Albumin: 3.4 g/dL — ABNORMAL LOW (ref 3.5–5.0)
Anion Gap: 8 mEq/L (ref 3–11)
BILIRUBIN TOTAL: 0.57 mg/dL (ref 0.20–1.20)
BUN: 14.7 mg/dL (ref 7.0–26.0)
CO2: 25 meq/L (ref 22–29)
CREATININE: 1.1 mg/dL (ref 0.6–1.1)
Calcium: 9 mg/dL (ref 8.4–10.4)
Chloride: 107 mEq/L (ref 98–109)
EGFR: 54 mL/min/{1.73_m2} — ABNORMAL LOW (ref >60)
GLUCOSE: 109 mg/dL (ref 70–140)
Potassium: 4.1 mEq/L (ref 3.5–5.1)
SODIUM: 140 meq/L (ref 136–145)
TOTAL PROTEIN: 6.3 g/dL — AB (ref 6.4–8.3)

## 2017-08-18 LAB — CBC WITH DIFFERENTIAL/PLATELET
BASO%: 1.5 % (ref 0.0–2.0)
BASOS ABS: 0.1 10*3/uL (ref 0.0–0.1)
EOS%: 5.6 % (ref 0.0–7.0)
Eosinophils Absolute: 0.3 10*3/uL (ref 0.0–0.5)
HEMATOCRIT: 33.3 % — AB (ref 34.8–46.6)
HGB: 11.1 g/dL — ABNORMAL LOW (ref 11.6–15.9)
LYMPH#: 1.6 10*3/uL (ref 0.9–3.3)
LYMPH%: 25.6 % (ref 14.0–49.7)
MCH: 33.1 pg (ref 25.1–34.0)
MCHC: 33.3 g/dL (ref 31.5–36.0)
MCV: 99.4 fL (ref 79.5–101.0)
MONO#: 0.5 10*3/uL (ref 0.1–0.9)
MONO%: 8.9 % (ref 0.0–14.0)
NEUT#: 3.5 10*3/uL (ref 1.5–6.5)
NEUT%: 58.4 % (ref 38.4–76.8)
Platelets: 187 10*3/uL (ref 145–400)
RBC: 3.35 10*6/uL — AB (ref 3.70–5.45)
RDW: 16.2 % — ABNORMAL HIGH (ref 11.2–14.5)
WBC: 6.1 10*3/uL (ref 3.9–10.3)

## 2017-08-18 MED ORDER — DENOSUMAB 120 MG/1.7ML ~~LOC~~ SOLN
120.0000 mg | Freq: Once | SUBCUTANEOUS | Status: AC
  Administered 2017-08-18: 120 mg via SUBCUTANEOUS

## 2017-08-18 NOTE — Progress Notes (Signed)
[facility]  Telephone:([phone]) [phone] Fax:(336) [phone]  Clinic Follow up Note   Patient Care Team: [name], MD as PCP - General (Family Medicine) 08/18/2017  CHIEF COMPLAINT: Follow up multiple myeloma  SUMMARY OF ONCOLOGIC HISTORY:   Multiple myeloma ([place])   10/18/2016 - 10/26/2016 Hospital Admission    Pt was admitted for symptomatic hypercalcemia, AKI, bone marrow biopsy done during hospital stay, and she received Aredia       10/19/2016 Tumor Marker    SPEP showed M-protein 0.4g/dl, serum lambda light chain 5362, ratio 0.00      10/25/2016 Bone Marrow Biopsy     Overall, the marrow is hypercellular with increased lambda-restricted plasma cells (62% aspirate, 90% CD138). There is partial expression of CD20, but no monoclonal B-cell population is detected by flow cytometry.       10/25/2016 Initial Diagnosis    Multiple myeloma ([place])      10/25/2016 Miscellaneous    Cytogenetics and FISH showed loss of 13q, which is intermediate risk for MM       11/10/2016 -  Chemotherapy    Revlimid 10 mg (increased to 50m on cycle 2) daily, 3 weeks on, and one week off, dexamethasone 20 mg weekly, was off from early June to mid July 2018 due to her right hip fracture and hospitalization.  Changed to maintenence Revlimid 123mand stopped dexamethasone starting next cycle, 06/10/17         01/02/2017 Imaging     DG Hip Unilat w or w/o Pelvis IMPRESSION 1. Degenerative changes lumbar spine and both hips. No evidence of fracture or dislocation.  2. Diffuse osteopenia. Multiple tiny lucencies are noted throughout both proximal femurs. These may be secondary to the patient's known multiple myeloma.      01/18/2017 Imaging     CT hip right wo contrast  IMPRESSION: Findings consistent with a pathologic fracture of the right femoral neck. A large lytic lesion in the superior aspect of the lateral femoral head is identified.  Possible nondisplaced fracture  of the right inferior pubic ramus.  Diffuse myeloma.      01/18/2017 - 01/23/2017 Hospital Admission    She present to the ED with complaints of severe right hip pain. she recieved a right hip hemiarthroplasty with Dr. MuPercell Miller   CURRENT THERAPY:  1. RD (Revlimid 10 mg daily, increased to 1547mrom cycle 3, 3 weeks on, one week off, and weekly dexamethasone 59m41mstarted on 11/10/2016. 2.Change to maintenance Revlimid 10mg59mly for 3 weeks on and 1 week off, and stopped dexamethasone started on 06/10/17 3. Aredia 60mg 62my 4 weeks, started on 10/24/2016 for hypercalcemia. Switch to Xgeva injections every 4 weeks on 01/02/17  INTERVAL HISTORY: Ms. Cade rGarczynskins for follow up as scheduled. She is mid-way through Revlimid cycle 3 weeks on and 1 week off. On 12/8 she developed itching raised red skin rash to bilateral arms and legs, none on face, trunk, or back. Benadryl did not improve. She went to PCP Dr. Wong oJacelyn Grip/18 who prescribed prednisone taper and a topical agent. The rash improved. Went back to PCP for 2-week follow up on 08/17/17 and was restarted on prednisone 1 tablet daily x10 days.   Daughter reports she is getting stronger in general. Eating and drinking well with increase appetite on prednisone and megace. Also drinking ensure. Pain is well controlled on [name] BID and oxycodone once per day; no new pain. Lower extremity swelling is stable, wearing compression  stockings; no upper extremity edema.   REVIEW OF SYSTEMS:   Constitutional: Denies fevers, chills (+) abnormal weight loss Eyes: Denies blurriness of vision Ears, nose, mouth, throat, and face: Denies mucositis or sore throat Respiratory: Denies cough, dyspnea or wheezes Cardiovascular: Denies palpitation, chest discomfort (+) lower extremity swelling Gastrointestinal:  Denies nausea, vomiting, constipation, diarrhea, heartburn or change in bowel habits Skin: (+) abnormal skin rash to arms and legs began on 07/22/17  improving on prednisone  Lymphatics: Denies new lymphadenopathy or easy bruising Neurological:Denies numbness, tingling or new weaknesses Behavioral/Psych: Mood is stable, no new changes  MSK: (+) R hip pain, well controlled on [name] BID and oxycodone approx once per day All other systems were reviewed with the patient and are negative.  MEDICAL HISTORY:  Past Medical History:  Diagnosis Date  . Allergy   . Arthritis   . Cancer ([place])   . Chronic kidney disease (CKD), stage III (moderate) ([place])    Archie Endo 09/23/2016  . Depression    Risperdal "twice/day" (09/23/2016)  . Glaucoma   . Hiatal hernia   . Hypertension   . Intractable back pain    /notes 09/23/2016  . Obesity   . Osteoporosis    vertebral fracture in 2012  . Upper GI bleed 04/2004   secondary to Mallory-Weiss tear/notes 12/28/2010  . Vertigo     SURGICAL HISTORY: Past Surgical History:  Procedure Laterality Date  . ABDOMINAL HYSTERECTOMY  1974   partial  . APPENDECTOMY     as a child/notes 12/28/2010  . BACK SURGERY    . BREAST BIOPSY Left 1999   Archie Endo 12/28/2010  . CATARACT EXTRACTION, BILATERAL Bilateral 2000s  . ESOPHAGOGASTRODUODENOSCOPY  04/2004   Archie Endo 12/28/2010  . HIP ARTHROPLASTY Right 01/19/2017   Procedure: RIGHT HIP HEMIARTHROPLASTY;  Surgeon: Renette Butters, MD;  Location: [facility];  Service: Orthopedics;  Laterality: Right;  . KYPHOPLASTY  05/2011   T10/notes 05/25/2011  . LAPAROSCOPIC CHOLECYSTECTOMY  10/2007   Archie Endo 12/14/2010    I have reviewed the social history and family history with the patient and they are unchanged from previous note.  ALLERGIES:  is allergic to penicillins; alendronate; and sulfonamide derivatives.  MEDICATIONS:  Current Outpatient Medications  Medication Sig Dispense Refill  . feeding supplement, ENSURE ENLIVE, (ENSURE ENLIVE) LIQD Take 237 mLs by mouth 2 (two) times daily between meals. 237 mL 12  . furosemide (LASIX) 20 MG tablet Take 20 mg by mouth daily.  1  .  lenalidomide (REVLIMID) 10 MG capsule Take 1 capsule (10 mg total) by mouth daily. Take Revlimid 10 mg by mouth daily for  21 days ;  Off  7 days. 21 capsule 0  . magnesium oxide (MAG-OX) 400 (241.3 Mg) MG tablet Take 1 tablet (400 mg total) by mouth daily. 7 tablet 0  . megestrol (MEGACE) 40 MG tablet Take 1 tablet (40 mg total) by mouth daily.    . mirtazapine (REMERON) 15 MG tablet Take 1 tablet (15 mg total) by mouth at bedtime. 90 tablet 1  . morphine ([name]) 30 MG 12 hr tablet Take 1 tablet (30 mg total) by mouth every 12 (twelve) hours. 60 tablet 0  . Oxycodone HCl 10 MG TABS Take 1 tablet (10 mg total) by mouth every 6 (six) hours as needed. FOR MODERATE OR SEVERE PAIN 75 tablet 0  . RESTASIS 0.05 % ophthalmic emulsion Place 1 drop into both eyes daily.     . risperiDONE (RISPERDAL) 0.5 MG tablet Take  0.5 mg by mouth 2 (two) times daily.    . sennosides-docusate sodium (SENOKOT-S) 8.6-50 MG tablet Take 2 tablets by mouth daily.    Marland Kitchen aspirin 81 MG chewable tablet Chew 81 mg by mouth daily.    . meclizine (ANTIVERT) 25 MG tablet TAKE ONE TABLET BY MOUTH THREE TIMES DAILY AS NEEDED 90 tablet 0  . ondansetron (ZOFRAN ODT) 4 MG disintegrating tablet Take 1-2 tablets (4-8 mg total) by mouth every 8 (eight) hours as needed for nausea or vomiting. (Patient not taking: Reported on 08/18/2017) 30 tablet 2   No current facility-administered medications for this visit.     PHYSICAL EXAMINATION: ECOG PERFORMANCE STATUS: 3 - Symptomatic, >50% confined to bed  Vitals:   08/18/17 1350  BP: (!) 161/66  Pulse: 74  Resp: 20  Temp: 98.4 F (36.9 C)  SpO2: 98%   Filed Weights   08/18/17 1350  Weight: 142 lb 1.6 oz (64.5 kg)    GENERAL:alert, no distress and comfortable SKIN: skin color, texture, turgor are normal, no significant lesions (+) no obvious rash on upper or lower extremities  EYES: normal, Conjunctiva are pink and non-injected, sclera clear OROPHARYNX:no exudate, no erythema and  lips, buccal mucosa, and tongue normal  NECK: supple, thyroid normal size, non-tender, without nodularity LYMPH:  no palpable cervical, supraclavicular, axillary lymphadenopathy  LUNGS: clear to auscultation bilaterally with normal breathing effort HEART: regular rate & rhythm and no murmurs (+) mild bilateral lower extremity edema ABDOMEN:abdomen soft, non-tender and normal bowel sounds Musculoskeletal:no cyanosis of digits and no clubbing  NEURO: alert & oriented x 3 with fluent speech, no focal motor/sensory deficits  LABORATORY DATA:  I have reviewed the data as listed CBC Latest Ref Rng & Units 08/18/2017 07/21/2017 06/16/2017  WBC 3.9 - 10.3 10e3/uL 6.1 4.6 4.3  Hemoglobin 11.6 - 15.9 g/dL 11.1(L) 10.5(L) 10.4(L)  Hematocrit 34.8 - 46.6 % 33.3(L) 31.3(L) 31.9(L)  Platelets 145 - 400 10e3/uL 187 174 115(L)     CMP Latest Ref Rng & Units 08/18/2017 07/21/2017 07/21/2017  Glucose 70 - 140 mg/dl 109 105 -  BUN 7.0 - 26.0 mg/dL 14.7 13.1 -  Creatinine 0.6 - 1.1 mg/dL 1.1 1.0 -  Sodium 136 - 145 mEq/L 140 140 -  Potassium 3.5 - 5.1 mEq/L 4.1 3.6 -  Chloride 101 - 111 mmol/L - - -  CO2 22 - 29 mEq/L 25 23 -  Calcium 8.4 - 10.4 mg/dL 9.0 8.5 -  Total Protein 6.4 - 8.3 g/dL 6.3(L) 6.1(L) 5.9(L)  Total Bilirubin 0.20 - 1.20 mg/dL 0.57 0.50 -  Alkaline Phos 40 - 150 U/L 58 71 -  AST 5 - 34 U/L 12 12 -  ALT 0 - 55 U/L 8 <6 -   MM LAB: M-protein (g/dl) 10/19/2016: 0.4 11/01/2016: Not Det 12/05/16: Not Det 01/02/17: Not det 02/27/17: Not det 03/24/17: not det.  04/24/17: not det.  05/19/17: not det. 06/16/17:: not det 07/21/17: not det 08/18/17: PENDING  Serum kappa, lamda light chains and ratio (mg/L) 10/21/2016: 14.8, 5362, 0.00 11/01/2016: 1.49, 10.88, 0.14 12/05/2016: 2.16, 19.31, 0.11 01/02/17: 3.01, 10.90, 0.28 02/27/17: 1.49, 20.64, 0.07 03/24/17: 2.19, 3.90, 0.56 04/24/17: 2.58, 4.02, 0.64 05/19/17: 2.22, 3.55, 0.63 06/16/17:: 4.18, 5.55, 0.75 07/21/17: 3.86, 5.10, 0.76 08/18/17:  PENDING   24 hour urine (total protein, free lambda, ration) 10/21/2016: 1463, 1570, 0.06   PATHOLOGY  Diagnosis 01/19/2017 Femoral head, fracture, Right - FRACTURE, SEE COMMENT. Microscopic Comment There are scattered plasma cells seen with CD138, predominately  in the area of fracture. Light chain in situ hybridization is non-contributory, likely due to heavy decalcification. Overt plasma cell neoplasm is not appreciated.  Interpretation 10/25/2016 Bone Marrow Flow Cytometry - NO MONOCLONAL B-CELL OR PHENOTYPICALLY ABERRANT T-CELL POPULATION IDENTIFIED.  Diagnosis 10/25/2016 Bone Marrow, Aspirate,Biopsy, and Clot - PLASMA CELL MYELOMA. - SEE COMMENT. PERIPHERAL BLOOD: - NORMOCYTIC ANEMIA. Diagnosis Note Overall, the marrow is hypercellular with increased lambda-restricted plasma cells (62% aspirate, 90% CD138). There is partial expression of CD20, but no monoclonal B-cell population is detected by flow cytometry.         RADIOGRAPHIC STUDIES: I have personally reviewed the radiological images as listed and agreed with the findings in the report. No results found.   ASSESSMENT & PLAN: 84 y.o.woman with HTN and CKD, presented with hypercalcemia, worsening anemia since 06/2016, s/p multiple fall, multiple hospitalization recently for back pain , CT scan showedT2 vertebral body compression fracture with mild height loss.  1. Multiple Myeloma, lambda light chain disease, staging III, intermediate risk (13q-) 2. Anemia 3. History of hypercalcemia/hypocalcemia 4. CKD, stage II 5. Severe back pain and right hip pain secondary to pathologic fracture  6. HTN 7. Osteoporosis, fractures 8. Goals of care discussion 9. Skin rash to upper and lower extremities  10. Decubitus sacral ulcer   Ms. Orsino appears stable today. I was not able to appreciate any rash to hands or distal aspects of upper extremities. Dr. Burr Medico discussed with Dr. Jacelyn Grip who reports there is still rash  to proximal lower extremity as well as decubitus sacral ulcer; these areas were not examined today. Her daughter will examine tonight to see if the rash is still there; if rash is resolved, she will take prednisone for 1 more day to complete 3 days. If the rash persists, she will complete 10 day course of steroids. Unclear if rash is due to Revlimid, it is unusual not to have facial or truncal rash if drug-related. If she continues to have rash on Revlimid, Dr. Burr Medico recommends changing multiple myeloma therapy, however, her disease appears well controlled at this time, today's MM panel is pending. Labs reviewed, CBC stable, Cmet unremarkable. She will continue maintenance revlimid 10 mg daily x3 weeks 1 and 1 week off.  She will continue Xgeva for osteoporosis and bone fractures, creatinine is normal today. She will return in 4 weeks for f/u with Dr. Burr Medico and Delton See.   PLAN -Examine rash, if rash is resolved, take prednisone for 1 more day then stop; if rash is present, complete 10 day course of prednisone  -Wound care apt 08/21/17 -Return in 4 weeks for f/u with Dr. Burr Medico and injection  All questions were answered. The patient knows to call the clinic with any problems, questions or concerns. No barriers to learning was detected.     Alla Feeling, NP 08/18/17   Addendum  I have seen the patient, examined her. I agree with the assessment and and plan and have edited the notes.   Ms. Newcomer is clinically doing well.  She developed significant skin rash on her arms and legs in early December 2018, resolved after a course of steroids.  The clinical presentation was was not very typical for Revlimid caused skin rash, although I cannot rule out completely.  Lab looks stable, M-protein has been negative, she will continue Revlimid, and keep [place] posted if she develops new rash.  Truitt Merle  08/18/2017

## 2017-08-18 NOTE — Patient Instructions (Signed)

## 2017-08-18 NOTE — Telephone Encounter (Signed)
Scheduled appt per 1/4 los - Gave patient AVS and calender per los.

## 2017-08-21 ENCOUNTER — Encounter (HOSPITAL_BASED_OUTPATIENT_CLINIC_OR_DEPARTMENT_OTHER): Payer: Medicare Other | Attending: Internal Medicine

## 2017-08-21 DIAGNOSIS — C9000 Multiple myeloma not having achieved remission: Secondary | ICD-10-CM | POA: Insufficient documentation

## 2017-08-21 DIAGNOSIS — L89312 Pressure ulcer of right buttock, stage 2: Secondary | ICD-10-CM | POA: Insufficient documentation

## 2017-08-21 DIAGNOSIS — N186 End stage renal disease: Secondary | ICD-10-CM | POA: Insufficient documentation

## 2017-08-21 DIAGNOSIS — L97311 Non-pressure chronic ulcer of right ankle limited to breakdown of skin: Secondary | ICD-10-CM | POA: Insufficient documentation

## 2017-08-21 DIAGNOSIS — Z79899 Other long term (current) drug therapy: Secondary | ICD-10-CM | POA: Insufficient documentation

## 2017-08-21 DIAGNOSIS — Z9221 Personal history of antineoplastic chemotherapy: Secondary | ICD-10-CM | POA: Insufficient documentation

## 2017-08-21 DIAGNOSIS — I120 Hypertensive chronic kidney disease with stage 5 chronic kidney disease or end stage renal disease: Secondary | ICD-10-CM | POA: Insufficient documentation

## 2017-08-21 DIAGNOSIS — L89323 Pressure ulcer of left buttock, stage 3: Secondary | ICD-10-CM | POA: Insufficient documentation

## 2017-08-21 LAB — MULTIPLE MYELOMA PANEL, SERUM
ALBUMIN SERPL ELPH-MCNC: 3.2 g/dL (ref 2.9–4.4)
Albumin/Glob SerPl: 1.2 (ref 0.7–1.7)
Alpha 1: 0.2 g/dL (ref 0.0–0.4)
Alpha2 Glob SerPl Elph-Mcnc: 0.6 g/dL (ref 0.4–1.0)
B-Globulin SerPl Elph-Mcnc: 0.9 g/dL (ref 0.7–1.3)
GLOBULIN, TOTAL: 2.8 g/dL (ref 2.2–3.9)
Gamma Glob SerPl Elph-Mcnc: 1 g/dL (ref 0.4–1.8)
IGA/IMMUNOGLOBULIN A, SERUM: 295 mg/dL (ref 64–422)
IGM (IMMUNOGLOBIN M), SRM: 21 mg/dL — AB (ref 26–217)
IgG, Qn, Serum: 963 mg/dL (ref 700–1600)
TOTAL PROTEIN: 6 g/dL (ref 6.0–8.5)

## 2017-08-21 LAB — KAPPA/LAMBDA LIGHT CHAINS
Ig Kappa Free Light Chain: 29 mg/L — ABNORMAL HIGH (ref 3.3–19.4)
Ig Lambda Free Light Chain: 35.8 mg/L — ABNORMAL HIGH (ref 5.7–26.3)
KAPPA/LAMBDA FLC RATIO: 0.81 (ref 0.26–1.65)

## 2017-08-28 DIAGNOSIS — L89323 Pressure ulcer of left buttock, stage 3: Secondary | ICD-10-CM | POA: Diagnosis not present

## 2017-09-04 ENCOUNTER — Other Ambulatory Visit: Payer: Self-pay | Admitting: *Deleted

## 2017-09-04 DIAGNOSIS — L89323 Pressure ulcer of left buttock, stage 3: Secondary | ICD-10-CM | POA: Diagnosis not present

## 2017-09-04 DIAGNOSIS — C9000 Multiple myeloma not having achieved remission: Secondary | ICD-10-CM

## 2017-09-04 MED ORDER — LENALIDOMIDE 10 MG PO CAPS: 10.0000 mg | ORAL_CAPSULE | Freq: Every day | ORAL | 0 refills | Status: DC

## 2017-09-11 ENCOUNTER — Other Ambulatory Visit (HOSPITAL_COMMUNITY)
Admission: RE | Admit: 2017-09-11 | Discharge: 2017-09-11 | Disposition: A | Discharge status: Home / Self Care | Payer: Medicare Other | Source: Other Acute Inpatient Hospital | Attending: Internal Medicine | Admitting: Internal Medicine

## 2017-09-11 DIAGNOSIS — Z882 Allergy status to sulfonamides status: Secondary | ICD-10-CM | POA: Insufficient documentation

## 2017-09-11 DIAGNOSIS — Z888 Allergy status to other drugs, medicaments and biological substances status: Secondary | ICD-10-CM | POA: Insufficient documentation

## 2017-09-11 DIAGNOSIS — L97311 Non-pressure chronic ulcer of right ankle limited to breakdown of skin: Secondary | ICD-10-CM | POA: Diagnosis present

## 2017-09-11 DIAGNOSIS — Z880 Allergy status to penicillin: Secondary | ICD-10-CM | POA: Diagnosis not present

## 2017-09-11 DIAGNOSIS — L89323 Pressure ulcer of left buttock, stage 3: Secondary | ICD-10-CM | POA: Diagnosis not present

## 2017-09-13 NOTE — Progress Notes (Signed)
[name]  [phone] [phone]  Clinic Follow Up Note   Patient Care Team: [name], MD as PCP - General (Family Medicine)   Date of Service: 09/15/2017  CHIEF COMPLAINTS:  Follow up Multiple Myeloma      [name]   10/18/2016 - 10/26/2016 Hospital Admission    Pt was admitted for symptomatic hypercalcemia, AKI, bone marrow biopsy done during hospital stay, and she received Aredia       10/19/2016 Tumor Marker    SPEP showed M-protein 0.4g/dl, serum lambda light chain 5362, ratio 0.00      10/25/2016 Bone Marrow Biopsy     Overall, the marrow is hypercellular with increased lambda-restricted plasma cells (62% aspirate, 90% CD138). There is partial expression of CD20, but no monoclonal B-cell population is detected by flow cytometry.       10/25/2016 Initial Diagnosis    [name]      10/25/2016 Miscellaneous    Cytogenetics and FISH showed loss of 13q, which is intermediate risk for MM       11/10/2016 -  Chemotherapy    Revlimid 10 mg (increased to 72m on cycle 2) daily, 3 weeks on, and one week off, dexamethasone 20 mg weekly, was off from early June to mid July 2018 due to her right hip fracture and hospitalization.  Changed to maintenence Revlimid 198mand stopped dexamethasone starting next cycle, 06/10/17         01/02/2017 Imaging     DG Hip Unilat w or w/o Pelvis IMPRESSION 1. Degenerative changes lumbar spine and both hips. No evidence of fracture or dislocation.  2. Diffuse osteopenia. Multiple tiny lucencies are noted throughout both proximal femurs. These may be secondary to the patient's known multiple myeloma.      01/18/2017 Imaging     CT hip right wo contrast  IMPRESSION: Findings consistent with a pathologic fracture of the right femoral neck. A large lytic lesion in the superior aspect of the lateral femoral head is identified.  Possible nondisplaced fracture of the  right inferior pubic ramus.  Diffuse myeloma.      01/18/2017 - 01/23/2017 Hospital Admission    She present to the ED with complaints of severe right hip pain. she recieved a right hip hemiarthroplasty with Dr. MuPercell Miller     HISTORY OF PRESENTING ILLNESS (10/20/2016):  84-year old African-American female, with past medical history of hypertension, chronic back pain, see Kristin [name] stage III, who presents with altered mental status. She was admitted for hypercalcemia. I was called for consult to rule out multiple myeloma.   The patient is joined by family today. They help to report the patient's history. The patient has been lethargic and confused for the past week. She has issues with mobility at this time. She is constipated.   The family reports the patient fell last October, and her PCP suspected she broke her back. The patient received a cortisone injection in January, which was followed by onset of pain and decreased mobility. The patient was subsequently admitted to [name], where the family reports they discovered she was being given someone else's medicine. This led to the patient's confused mental status and difficulty with ambulation. The patient fell again while in [name], and she was brought back to home by her family. She has 24/7 care at home. In the ED, she was found to have calcium more than 15, she was admitted for further management. She received calcitonin, her  calcium came down to 12.7 today. Her SPEP was positive for M protein-0.4 g/dl, possible represent a monoclonal gammopathy. Her 24-hour urine light chains are pending. Her PTH was normal at 20, PTH-rp is pending.   CURRENT THERAPY:  1. RD (Revlimid 10 mg daily, increased to 21m from cycle 3, 3 weeks on, one week off, and weekly dexamethasone 243m, started on 11/10/2016. 2.Change to maintenance Revlimid 1065maily for 3 weeks on and 1 week off, and stopped dexamethasone started on 06/10/17 3. Aredia 52m23mvery 4 weeks, started on 10/24/2016 for hypercalcemia. Switch to Xgeva injections every 4 weeks on 01/02/17  INTERIM HISTORY: [name] 84 y69. female here for follow up accompanied by her daughter. She notes she is doing well. She reports she is doing okay overall.   She reports she has a wound in her lower back/butt area and right ankle onset 1 month ago. She is being treated for this by Wound care. She goes weekly. She has dressings changed daily and she is taking antibiotics. She has continued complaints of pain in her right hip where she had surgery. She reports it hurts sometimes when she is still and sometimes when she walks with her walker. She has been taking more of her Oxycodone (2-3 a day) since this pain started.   On review of systems, pt denies rash, or any other complaints at this time. Pertinent positives are listed and detailed within the above HPI.   MEDICAL HISTORY:  Past Medical History:  Diagnosis Date  . Allergy   . Arthritis   . Cancer (HCC)[place]. Chronic kidney disease (CKD), stage III (moderate) (HCC)[place] /notArchie Endo/2018  . Depression    Risperdal "twice/day" (09/23/2016)  . Glaucoma   . Hiatal hernia   . Hypertension   . Intractable back pain    /notes 09/23/2016  . Obesity   . Osteoporosis    vertebral fracture in 2012  . Upper GI bleed 04/2004   secondary to Mallory-Weiss tear/notes 12/28/2010  . Vertigo     SURGICAL HISTORY: Past Surgical History:  Procedure Laterality Date  . ABDOMINAL HYSTERECTOMY  1974   partial  . APPENDECTOMY     as a child/notes 12/28/2010  . BACK SURGERY    . BREAST BIOPSY Left 1999   /notArchie Endo5/2012  . CATARACT EXTRACTION, BILATERAL Bilateral 2000s  . ESOPHAGOGASTRODUODENOSCOPY  04/2004   /notArchie Endo5/2012  . HIP ARTHROPLASTY Right 01/19/2017   Procedure: RIGHT HIP HEMIARTHROPLASTY;  Surgeon: MurpRenette Butters;  [facility];  Laterality: Right;  . KYPHOPLASTY  05/2011   T10/notes  05/25/2011  . LAPAROSCOPIC CHOLECYSTECTOMY  10/2007   /notArchie Endo/2012    SOCIAL HISTORY: Social History   Socioeconomic History  . Marital status: Widowed    Spouse name: Not on file  . Number of children: Not on file  . Years of education: Not on file  . Highest education level: Not on file  Social Needs  . Financial resource strain: Not on file  . Food insecurity - worry: Not on file  . Food insecurity - inability: Not on file  . Transportation needs - medical: Not on file  . Transportation needs - non-medical: Not on file  Occupational History  . Not on file  Tobacco Use  . Smoking status: Never Smoker  . Smokeless tobacco: Never Used  Substance and Sexual Activity  . Alcohol use: Yes    Comment: 09/23/2016 "glass  of wine a few times/year"  . Drug use: No  . Sexual activity: No  Other Topics Concern  . Not on file  Social History Narrative  . Not on file    FAMILY HISTORY: Family History  Problem Relation Age of Onset  . Hypertension Mother   . Osteoporosis Mother   . CVA Mother   . Cancer Neg Hx     ALLERGIES:  is allergic to penicillins; alendronate; and sulfonamide derivatives.  MEDICATIONS:  Current Outpatient Medications  Medication Sig Dispense Refill  . aspirin 81 MG chewable tablet Chew 81 mg by mouth daily.    Marland Kitchen doxycycline (MONODOX) 100 MG capsule Take 1 capsule by mouth 2 (two) times daily.  0  . feeding supplement, ENSURE ENLIVE, (ENSURE ENLIVE) LIQD Take 237 mLs by mouth 2 (two) times daily between meals. 237 mL 12  . furosemide (LASIX) 20 MG tablet Take 20 mg by mouth daily.  1  . lenalidomide (REVLIMID) 10 MG capsule Take 1 capsule (10 mg total) by mouth daily. Take Revlimid 10 mg by mouth daily for  21 days ;  Off  7 days. 21 capsule 0  . magnesium oxide (MAG-OX) 400 (241.3 Mg) MG tablet Take 1 tablet (400 mg total) by mouth daily. 7 tablet 0  . meclizine (ANTIVERT) 25 MG tablet TAKE ONE TABLET BY MOUTH THREE TIMES DAILY AS NEEDED 90 tablet 0    . megestrol (MEGACE) 40 MG tablet Take 1 tablet (40 mg total) by mouth daily.    . mirtazapine (REMERON) 15 MG tablet Take 1 tablet (15 mg total) by mouth at bedtime. 90 tablet 1  . morphine ([name]) 30 MG 12 hr tablet Take 1 tablet (30 mg total) by mouth every 12 (twelve) hours. 60 tablet 0  . Oxycodone HCl 10 MG TABS Take 1 tablet (10 mg total) by mouth every 6 (six) hours as needed. FOR MODERATE OR SEVERE PAIN 90 tablet 0  . RESTASIS 0.05 % ophthalmic emulsion Place 1 drop into both eyes daily.     . risperiDONE (RISPERDAL) 0.5 MG tablet Take 0.5 mg by mouth 2 (two) times daily.    . sennosides-docusate sodium (SENOKOT-S) 8.6-50 MG tablet Take 2 tablets by mouth daily.    . clotrimazole-betamethasone (LOTRISONE) cream APPLY TOPICALLY TO AFFECTED AREA EVERY DAY  1  . ondansetron (ZOFRAN ODT) 4 MG disintegrating tablet Take 1-2 tablets (4-8 mg total) by mouth every 8 (eight) hours as needed for nausea or vomiting. (Patient not taking: Reported on 08/18/2017) 30 tablet 2   No current facility-administered medications for this visit.    Facility-Administered Medications Ordered in Other Visits  Medication Dose Route Frequency Provider Last Rate Last Dose  . denosumab (XGEVA) injection 120 mg  120 mg Subcutaneous Once Kristin Merle, MD        REVIEW OF SYSTEMS:  Constitutional: Denies fevers, chills or abnormal night sweats  Eyes: Denies blurriness of vision, double vision or watery eyes Ears, nose, mouth, throat, and face: Denies mucositis or sore throat  Respiratory: Denies cough, dyspnea or wheezes Cardiovascular: Denies palpitation, chest discomfort (+) mild lower extremity swelling and right arm swelling Gastrointestinal:  Denies heartburn Skin: Denies abnormal skin rashes (+) wound on lower back/buttock and right ankle  Lymphatics: Denies new lymphadenopathy or easy bruising  Neurological:Denies numbness, tingling or new weaknesses  Behavioral/Psych: Mood is stable, no new changes   Musculoskeletal: (+) left lower back and improved right side pain, managed with Morphine  All other systems were  reviewed with the patient and are negative.  PHYSICAL EXAMINATION: ECOG PERFORMANCE STATUS: 3 Vitals:   09/15/17 1343  BP: 119/60  Pulse: 81  Resp: 18  Temp: 98.3 F (36.8 C)  TempSrc: Oral  SpO2: 99%  Weight: 138 lb 8 oz (62.8 kg)  Height: 4' 9"  (1.448 m)     GENERAL:alert, no distress and comfortable, sitting in wheelchair  SKIN: skin color, texture, turgor are normal, no rashes or significant lesions EYES: normal, conjunctiva are pink and non-injected, sclera clear OROPHARYNX:no exudate, no erythema and lips, buccal mucosa, and tongue normal  NECK: supple, thyroid normal size, non-tender, without nodularity LYMPH:  no palpable lymphadenopathy in the cervical, axillary or inguinal LUNGS: clear to auscultation and percussion with normal breathing effort HEART: regular rate & rhythm and no murmurs and no lower extremity edema ABDOMEN:abdomen soft, non-tender and normal bowel sounds Musculoskeletal:no cyanosis of digits and no clubbing  PSYCH: alert & oriented x 3 with fluent speech NEURO: no focal motor/sensory deficits  LABORATORY DATA:  I have reviewed the data as listed CBC Latest Ref Rng & Units 09/15/2017 08/18/2017 07/21/2017  WBC 3.9 - 10.3 K/uL 4.6 6.1 4.6  Hemoglobin 11.6 - 15.9 g/dL 10.1(L) 11.1(L) 10.5(L)  Hematocrit 34.8 - 46.6 % 30.6(L) 33.3(L) 31.3(L)  Platelets 145 - 400 K/uL 201 187 174   CMP Latest Ref Rng & Units 09/15/2017 08/18/2017 08/18/2017  Glucose 70 - 140 mg/dL 97 109 -  BUN 7 - 26 mg/dL 17 14.7 -  Creatinine 0.60 - 1.10 mg/dL 1.14(H) 1.1 -  Sodium 136 - 145 mmol/L 140 140 -  Potassium 3.5 - 5.1 mmol/L 4.7 4.1 -  Chloride 98 - 109 mmol/L 106 - -  CO2 22 - 29 mmol/L 26 25 -  Calcium 8.4 - 10.4 mg/dL 8.4 9.0 -  Total Protein 6.4 - 8.3 g/dL 6.2(L) 6.3(L) 6.0  Total Bilirubin 0.2 - 1.2 mg/dL 0.4 0.57 -  Alkaline Phos 40 - 150 U/L 68 58 -  AST  5 - 34 U/L 15 12 -  ALT 0 - 55 U/L 6 8 -    MM LAB: M-protein (g/dl) 10/19/2016: 0.4 11/01/2016: Not Det 12/05/16: Not Det 01/02/17: Not det 02/27/17: Not det 03/24/17: not det.  04/24/17: not det.  05/19/17: not det. 06/16/17:: not det 07/21/17: not det 08/18/17: not det 09/15/17: PENDING  Serum kappa, lamda light chains and ratio (mg/L) 10/21/2016: 14.8, 5362, 0.00 11/01/2016: 1.49, 10.88, 0.14 12/05/2016: 2.16, 19.31, 0.11 01/02/17: 3.01, 10.90, 0.28 02/27/17: 1.49, 20.64, 0.07 03/24/17: 2.19, 3.90, 0.56 04/24/17: 2.58, 4.02, 0.64 05/19/17: 2.22, 3.55, 0.63 06/16/17:: 4.18, 5.55, 0.75 07/21/17: 3.86, 5.10, 0.76 08/18/17: 2.90, 3.58, 0.81 09/15/17: PENDING  24 hour urine (total protein, free lambda, ration) 10/21/2016: 1463, 1570, 0.06   PATHOLOGY  Diagnosis 01/19/2017 Femoral head, fracture, Right - FRACTURE, SEE COMMENT. Microscopic Comment There are scattered plasma cells seen with CD138, predominately in the area of fracture. Light chain in situ hybridization is non-contributory, likely due to heavy decalcification. Overt plasma cell neoplasm is not appreciated.  Interpretation 10/25/2016 Bone Marrow Flow Cytometry - NO MONOCLONAL B-CELL OR PHENOTYPICALLY ABERRANT T-CELL POPULATION IDENTIFIED.  Diagnosis 10/25/2016 Bone Marrow, Aspirate,Biopsy, and Clot - PLASMA CELL MYELOMA. - SEE COMMENT. PERIPHERAL BLOOD: - NORMOCYTIC ANEMIA. Diagnosis Note Overall, the marrow is hypercellular with increased lambda-restricted plasma cells (62% aspirate, 90% CD138). There is partial expression of CD20, but no monoclonal B-cell population is detected by flow cytometry.     RADIOGRAPHIC STUDIES: I have personally reviewed the radiological  images as listed and agreed with the findings in the report. No results found.   DG Hip Unilateral 01/02/17 IMPRESSION: 1. Degenerative changes lumbar spine and both hips. No evidence of fracture or dislocation. 2. Diffuse osteopenia. Multiple tiny lucencies  are noted throughout both proximal femurs. These may be secondary to the patient's known multiple myeloma.   ASSESSMENT & PLAN:  84 y.o.woman with HTN and CKD, presented with hypercalcemia, worsening anemia since 06/2016, s/p multiple fall, multiple hospitalization recently for back pain , CT scan showedT2 vertebral body compression fracture with mild height loss.  1. 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 -We previously discussed treatment options.  due to advanced age, multiple comorbidities, very limited performance status, I do not believe she is a candidate for bone marrow transplant -she was started in induction RD. Start on dexamethasone 76m weekly and Revlimid 172mdaily; 3 weeks on, 1 week off, at adjusted for her renal function. She has been tolerating well, dose increased to 158mrom cycle 3 -due to her Intermediated risk disease, she may need Velcade. Due to her advanced age and comorbidities, I am concerned about her tolerance to Velcade. I will try RD for a few cycles, if she does not have good response, I will add Velcade.  -The goal of therapy is palliative, for disease control, improve her quality of life and prolong life. - Repeat multiple myeloma panel every 4 weeks  - she has underwent right hip surgery with Dr. MurPercell Miller June 2018, she was off Revlimid for about 6 weeks. - she has been tolerating Revlimid and dexamethasone well -Her M-protein is negative and she is responding well to treatment. Will continue Revlimid.  -Given her excellent response to  treatment, I have changed her to maintenance therapy. -she is currently on maintenence Revlimid 10 mg daily for 3 weeks, and one-week off. She will continue this until disease progresses.  She understands her multiple myeloma is not curable and the goal of therapy is disease control.  -Labs reviewed, potassium normalized, Hg 10.1. Her M-protein panel is still pending. She will continue Revlimid Maintenance. She is clinically doing well.  -If her response remains good and her labs stable I will space out her labs and visits. She has difficult vein access -she is clinically stable, except mild worsening back and hip pain, she will f/u with her orthopedic surgeon -Her M protein has been undetectable, no disease progression so far. -Continue monthly Xgeva for a total of 2 years  -F/u in 6 weeks    2. Anemia  -secondary to MM and Revlimid. -She received blood transfusion in hospital -will monitor CBC closely -consider blood transfusion if Hb<8.0 or symptomatic anemia with Hb 8-9 -Her anemia has slightly improved since he came off Revlimid, hemoglobin 10.3 (02/27/17) -Anemia is 9.4 on (04/24/17), does not need blood  transfusion. We will continue to monitor -HG slightly improved but overall stable, Hg at 9.8 (05/19/17) -Hg still improving, now 10.4 (06/16/17)  -Stable   3. History of hypercalcemia/hypocalcemia  -She has received one dose of Aredia, hypercalcemia has nearly resolved now. We'll continue every 4 weeks for multiple myeloma, or more  frequent if her hypercalcemia gets worse. -Her reached normal limits previously  - I previously encouraged her to take vitD, 800 unit daily  -Her Calcium is now 8.0 (04/24/17) so I suggest she takes some Vitamin D with Calcium 800 mg once a day, I'll increase her calcium to twice daily -calcium stable at 8.1 (05/19/17), no Xgeva was given that day -Calcium has dropped to 7.4 on (06/16/17), No injection was given that day. -I strongly encouraged her to restart  calcium with vitamin D BID and increase dairy intake. We'll resume Xgeva injection next month if her calcium level improves -Her Ca is 8.4 today (09/15/17) She can get Xgeva today, but we will see her back in 6 weeks instead of 4 to allow her Ca to get higher. She is compliant with her CA supplement and takes it twice a day.    4. Chronic CKD, stage II -much improved, EGFR 50-60 -Avoid nephrotoxic medicine or contrast   5. Severe back pain and right hip pain from pathologic fracture -T2 compression fracture in the right hip is logical fracture secondary to her multiple myeloma -She will continue [name] every 12 hours and oxycodone as needed -we discussed taking laxative due to increased oxycodone prescription -We discussed using walker and avoid weight bearing on her right hip -Will give referral to orthopedic surgery for further evaluation  -She is fine to take tylenol, but she should avoid advil due to her MM.  -Pain is controlled with Morphine and oxycodone BID, refilled on (09/15/17)  6. HTN -controlled with medication -managed by PCP  7. Osteoporosis and fractures -Takes Boniva, which has been switched to Aredia  -Due to her poor IV access, I'll switch Aredia to Xgeva every 4 weeks -Started Xgeva 01/02/17, and continue every 4 weeks, tolerating well. She will continue for the next 2 years  8. Goal of care discussion  -We previously discussed the incurable nature of her MM, and the overall poor prognosis, especially if she does not have good response to chemotherapy or progress on chemo -The patient understands the goal of care is palliative. -I recommend DNR/DNI, she will think about it   9. Nausea  - I previously recommended she take some type of nausea medication. She is hesitant, but agreed to try Zofran 4-39m. She can take up to 3x daily. -She has constipation secondary to Zofran -continue Compazine for her nausea  -Overall much improved  10. Anorexia  - I have  previously encouraged her to eat snacks in between her meals -I previously recommended her to try mirtazapine 7.535mHS, may increase dose in future if needed  -Remeron is working well and her appetite has improved lately.  -Increased mirtazapine to 1545m9/10/18) -Appetite has improved.   11. Swelling in lower extremity, swelling in right arm  -Prescribed Lasix by Dr. WonJacelyn Grip discussed that she can take it as needed to not significantly effect her kidney function, potassium and calcium levels.  -I recommend her to elevate her feet and wear compression socks to help.  -Her right arm swelling is possibly positional (she sleeps on her right side). I suggest sleeping on her left side or back and elevate her right arm, if no improvement, I'll get a Doppler to ruled out DVT. She plans to wait on doppler.  -Will monitor. Overall improved   12. Malnutrition -Her appetite is low and she states she really does not like Ensure boosts. I recommend that she try milkshakes and ice cream -She has been  loosing weight, I would like her to see nutritionist. She is agreeable   Plan -Refilled oxycodone and morphine today -Lab f/u and injection in 6 weeks  -Continue Revlimid   All questions were answered. The patient knows to call the clinic with any problems, questions or concerns.  I spent 20 minutes counseling the patient face to face. The total time spent in the appointment was 30 minutes and more than 50% was on counseling.  This document serves as a record of services personally performed by Kristin Merle, MD. It was created on her behalf by Theresia Bough, a trained medical scribe. The creation of this record is based on the scribe's personal observations and the provider's statements to them.   I have reviewed the above documentation for accuracy and completeness, and I agree with the above.    Kristin Merle, MD 09/15/2017 2:40 PM

## 2017-09-15 ENCOUNTER — Inpatient Hospital Stay: Payer: Medicare Other | Attending: Hematology | Admitting: Hematology

## 2017-09-15 ENCOUNTER — Inpatient Hospital Stay: Payer: Medicare Other

## 2017-09-15 VITALS — BP 119/60 | HR 81 | Temp 98.3°F | Resp 18 | Ht <= 58 in | Wt 138.5 lb

## 2017-09-15 DIAGNOSIS — F329 Major depressive disorder, single episode, unspecified: Secondary | ICD-10-CM | POA: Insufficient documentation

## 2017-09-15 DIAGNOSIS — K449 Diaphragmatic hernia without obstruction or gangrene: Secondary | ICD-10-CM

## 2017-09-15 DIAGNOSIS — C9000 Multiple myeloma not having achieved remission: Secondary | ICD-10-CM | POA: Diagnosis present

## 2017-09-15 DIAGNOSIS — Z79899 Other long term (current) drug therapy: Secondary | ICD-10-CM | POA: Insufficient documentation

## 2017-09-15 DIAGNOSIS — D630 Anemia in neoplastic disease: Secondary | ICD-10-CM

## 2017-09-15 DIAGNOSIS — N183 Chronic kidney disease, stage 3 (moderate): Secondary | ICD-10-CM | POA: Insufficient documentation

## 2017-09-15 DIAGNOSIS — M549 Dorsalgia, unspecified: Secondary | ICD-10-CM | POA: Diagnosis not present

## 2017-09-15 DIAGNOSIS — M25551 Pain in right hip: Secondary | ICD-10-CM | POA: Diagnosis not present

## 2017-09-15 DIAGNOSIS — Z8719 Personal history of other diseases of the digestive system: Secondary | ICD-10-CM | POA: Diagnosis not present

## 2017-09-15 DIAGNOSIS — S31000S Unspecified open wound of lower back and pelvis without penetration into retroperitoneum, sequela: Secondary | ICD-10-CM | POA: Insufficient documentation

## 2017-09-15 DIAGNOSIS — E46 Unspecified protein-calorie malnutrition: Secondary | ICD-10-CM | POA: Insufficient documentation

## 2017-09-15 DIAGNOSIS — R630 Anorexia: Secondary | ICD-10-CM | POA: Insufficient documentation

## 2017-09-15 DIAGNOSIS — Z9049 Acquired absence of other specified parts of digestive tract: Secondary | ICD-10-CM

## 2017-09-15 DIAGNOSIS — Z9181 History of falling: Secondary | ICD-10-CM | POA: Diagnosis not present

## 2017-09-15 DIAGNOSIS — S91001S Unspecified open wound, right ankle, sequela: Secondary | ICD-10-CM

## 2017-09-15 DIAGNOSIS — K5900 Constipation, unspecified: Secondary | ICD-10-CM | POA: Insufficient documentation

## 2017-09-15 DIAGNOSIS — Z8781 Personal history of (healed) traumatic fracture: Secondary | ICD-10-CM | POA: Diagnosis not present

## 2017-09-15 DIAGNOSIS — M8000XS Age-related osteoporosis with current pathological fracture, unspecified site, sequela: Secondary | ICD-10-CM | POA: Diagnosis not present

## 2017-09-15 DIAGNOSIS — I10 Essential (primary) hypertension: Secondary | ICD-10-CM

## 2017-09-15 DIAGNOSIS — Z9221 Personal history of antineoplastic chemotherapy: Secondary | ICD-10-CM | POA: Diagnosis not present

## 2017-09-15 DIAGNOSIS — E669 Obesity, unspecified: Secondary | ICD-10-CM

## 2017-09-15 DIAGNOSIS — R110 Nausea: Secondary | ICD-10-CM | POA: Diagnosis not present

## 2017-09-15 DIAGNOSIS — I129 Hypertensive chronic kidney disease with stage 1 through stage 4 chronic kidney disease, or unspecified chronic kidney disease: Secondary | ICD-10-CM | POA: Insufficient documentation

## 2017-09-15 DIAGNOSIS — M129 Arthropathy, unspecified: Secondary | ICD-10-CM | POA: Insufficient documentation

## 2017-09-15 DIAGNOSIS — D509 Iron deficiency anemia, unspecified: Secondary | ICD-10-CM

## 2017-09-15 DIAGNOSIS — N179 Acute kidney failure, unspecified: Secondary | ICD-10-CM | POA: Diagnosis not present

## 2017-09-15 DIAGNOSIS — Z90710 Acquired absence of both cervix and uterus: Secondary | ICD-10-CM | POA: Insufficient documentation

## 2017-09-15 DIAGNOSIS — Z7982 Long term (current) use of aspirin: Secondary | ICD-10-CM | POA: Insufficient documentation

## 2017-09-15 LAB — COMPREHENSIVE METABOLIC PANEL
ALBUMIN: 3.1 g/dL — AB (ref 3.5–5.0)
ALK PHOS: 68 U/L (ref 40–150)
ALT: 6 U/L (ref 0–55)
ANION GAP: 8 (ref 3–11)
AST: 15 U/L (ref 5–34)
BILIRUBIN TOTAL: 0.4 mg/dL (ref 0.2–1.2)
BUN: 17 mg/dL (ref 7–26)
CALCIUM: 8.4 mg/dL (ref 8.4–10.4)
CO2: 26 mmol/L (ref 22–29)
Chloride: 106 mmol/L (ref 98–109)
Creatinine, Ser: 1.14 mg/dL — ABNORMAL HIGH (ref 0.60–1.10)
GFR calc Af Amer: 49 mL/min — ABNORMAL LOW (ref >60)
GFR, EST NON AFRICAN AMERICAN: 43 mL/min — AB (ref >60)
GLUCOSE: 97 mg/dL (ref 70–140)
POTASSIUM: 4.7 mmol/L (ref 3.5–5.1)
Sodium: 140 mmol/L (ref 136–145)
TOTAL PROTEIN: 6.2 g/dL — AB (ref 6.4–8.3)

## 2017-09-15 LAB — CBC WITH DIFFERENTIAL/PLATELET
BASOS ABS: 0.1 10*3/uL (ref 0.0–0.1)
BASOS PCT: 1 %
EOS PCT: 3 %
Eosinophils Absolute: 0.2 10*3/uL (ref 0.0–0.5)
HEMATOCRIT: 30.6 % — AB (ref 34.8–46.6)
Hemoglobin: 10.1 g/dL — ABNORMAL LOW (ref 11.6–15.9)
LYMPHS PCT: 31 %
Lymphs Abs: 1.4 10*3/uL (ref 0.9–3.3)
MCH: 32.3 pg (ref 25.1–34.0)
MCHC: 33 g/dL (ref 31.5–36.0)
MCV: 97.8 fL (ref 79.5–101.0)
Monocytes Absolute: 0.4 10*3/uL (ref 0.1–0.9)
Monocytes Relative: 8 %
NEUTROS ABS: 2.6 10*3/uL (ref 1.5–6.5)
Neutrophils Relative %: 57 %
PLATELETS: 201 10*3/uL (ref 145–400)
RBC: 3.13 MIL/uL — AB (ref 3.70–5.45)
RDW: 15.3 % — AB (ref 11.2–14.5)
WBC: 4.6 10*3/uL (ref 3.9–10.3)

## 2017-09-15 MED ORDER — DENOSUMAB 120 MG/1.7ML ~~LOC~~ SOLN
120.0000 mg | Freq: Once | SUBCUTANEOUS | Status: AC
  Administered 2017-09-15: 120 mg via SUBCUTANEOUS

## 2017-09-15 MED ORDER — MORPHINE SULFATE ER 30 MG PO TBCR: 30.0000 mg | EXTENDED_RELEASE_TABLET | Freq: Two times a day (BID) | ORAL | 0 refills | Status: DC

## 2017-09-15 MED ORDER — OXYCODONE HCL 10 MG PO TABS: 10.0000 mg | ORAL_TABLET | Freq: Four times a day (QID) | ORAL | 0 refills | Status: DC | PRN

## 2017-09-15 MED ORDER — DENOSUMAB 120 MG/1.7ML ~~LOC~~ SOLN
SUBCUTANEOUS | Status: AC
  Filled 2017-09-15: qty 1.7

## 2017-09-16 ENCOUNTER — Encounter: Payer: Self-pay | Admitting: Hematology

## 2017-09-16 LAB — AEROBIC/ANAEROBIC CULTURE (SURGICAL/DEEP WOUND)

## 2017-09-16 LAB — AEROBIC/ANAEROBIC CULTURE W GRAM STAIN (SURGICAL/DEEP WOUND): Gram Stain: NONE SEEN

## 2017-09-18 ENCOUNTER — Encounter (HOSPITAL_BASED_OUTPATIENT_CLINIC_OR_DEPARTMENT_OTHER): Payer: Medicare Other | Attending: Internal Medicine

## 2017-09-18 DIAGNOSIS — N186 End stage renal disease: Secondary | ICD-10-CM | POA: Diagnosis not present

## 2017-09-18 DIAGNOSIS — C9000 Multiple myeloma not having achieved remission: Secondary | ICD-10-CM | POA: Diagnosis not present

## 2017-09-18 DIAGNOSIS — B379 Candidiasis, unspecified: Secondary | ICD-10-CM | POA: Diagnosis not present

## 2017-09-18 DIAGNOSIS — L89322 Pressure ulcer of left buttock, stage 2: Secondary | ICD-10-CM | POA: Diagnosis not present

## 2017-09-18 DIAGNOSIS — L89513 Pressure ulcer of right ankle, stage 3: Secondary | ICD-10-CM | POA: Insufficient documentation

## 2017-09-18 DIAGNOSIS — I120 Hypertensive chronic kidney disease with stage 5 chronic kidney disease or end stage renal disease: Secondary | ICD-10-CM | POA: Insufficient documentation

## 2017-09-18 DIAGNOSIS — Z9221 Personal history of antineoplastic chemotherapy: Secondary | ICD-10-CM | POA: Diagnosis not present

## 2017-09-18 LAB — KAPPA/LAMBDA LIGHT CHAINS
Kappa free light chain: 55.3 mg/L — ABNORMAL HIGH (ref 3.3–19.4)
Kappa, lambda light chain ratio: 1.01 (ref 0.26–1.65)
Lambda free light chains: 54.7 mg/L — ABNORMAL HIGH (ref 5.7–26.3)

## 2017-09-19 LAB — MULTIPLE MYELOMA PANEL, SERUM
ALBUMIN/GLOB SERPL: 1.1 (ref 0.7–1.7)
Albumin SerPl Elph-Mcnc: 3 g/dL (ref 2.9–4.4)
Alpha 1: 0.3 g/dL (ref 0.0–0.4)
Alpha2 Glob SerPl Elph-Mcnc: 0.6 g/dL (ref 0.4–1.0)
B-Globulin SerPl Elph-Mcnc: 0.9 g/dL (ref 0.7–1.3)
Gamma Glob SerPl Elph-Mcnc: 1 g/dL (ref 0.4–1.8)
Globulin, Total: 2.8 g/dL (ref 2.2–3.9)
IGA: 434 mg/dL — AB (ref 64–422)
IGG (IMMUNOGLOBIN G), SERUM: 1003 mg/dL (ref 700–1600)
IGM (IMMUNOGLOBULIN M), SRM: 18 mg/dL — AB (ref 26–217)
TOTAL PROTEIN ELP: 5.8 g/dL — AB (ref 6.0–8.5)

## 2017-09-25 DIAGNOSIS — L89513 Pressure ulcer of right ankle, stage 3: Secondary | ICD-10-CM | POA: Diagnosis not present

## 2017-09-26 ENCOUNTER — Other Ambulatory Visit: Payer: Self-pay | Admitting: *Deleted

## 2017-09-26 DIAGNOSIS — C9000 Multiple myeloma not having achieved remission: Secondary | ICD-10-CM

## 2017-09-26 MED ORDER — LENALIDOMIDE 10 MG PO CAPS: 10.0000 mg | ORAL_CAPSULE | Freq: Every day | ORAL | 0 refills | Status: DC

## 2017-09-28 ENCOUNTER — Telehealth (HOSPITAL_COMMUNITY): Payer: Self-pay | Admitting: Surgery

## 2017-09-28 NOTE — Telephone Encounter (Signed)
Received an order from [facility] for LE arterial duplex with ABI's/TBI's.  Our next available appointment is 03/27.  The daughter declined the appointment stating her mother would need to be seen before then.   I spoke with Shanon Brow at [facility] to advise.

## 2017-09-29 ENCOUNTER — Other Ambulatory Visit: Payer: Self-pay | Admitting: Internal Medicine

## 2017-09-29 DIAGNOSIS — L97319 Non-pressure chronic ulcer of right ankle with unspecified severity: Secondary | ICD-10-CM

## 2017-10-02 ENCOUNTER — Other Ambulatory Visit (HOSPITAL_COMMUNITY): Payer: Self-pay

## 2017-10-02 ENCOUNTER — Ambulatory Visit (HOSPITAL_COMMUNITY)
Admission: RE | Admit: 2017-10-02 | Discharge: 2017-10-02 | Disposition: A | Discharge status: Home / Self Care | Payer: Medicare Other | Source: Ambulatory Visit | Attending: Cardiovascular Disease | Admitting: Cardiovascular Disease

## 2017-10-02 ENCOUNTER — Ambulatory Visit (HOSPITAL_COMMUNITY)
Admission: RE | Admit: 2017-10-02 | Discharge: 2017-10-02 | Disposition: A | Discharge status: Home / Self Care | Payer: Medicare Other | Source: Ambulatory Visit | Attending: Internal Medicine | Admitting: Internal Medicine

## 2017-10-02 DIAGNOSIS — L89513 Pressure ulcer of right ankle, stage 3: Secondary | ICD-10-CM | POA: Diagnosis not present

## 2017-10-02 DIAGNOSIS — I10 Essential (primary) hypertension: Secondary | ICD-10-CM | POA: Diagnosis not present

## 2017-10-02 DIAGNOSIS — I739 Peripheral vascular disease, unspecified: Secondary | ICD-10-CM | POA: Diagnosis present

## 2017-10-02 DIAGNOSIS — R9389 Abnormal findings on diagnostic imaging of other specified body structures: Secondary | ICD-10-CM | POA: Insufficient documentation

## 2017-10-02 DIAGNOSIS — L97319 Non-pressure chronic ulcer of right ankle with unspecified severity: Secondary | ICD-10-CM

## 2017-10-02 DIAGNOSIS — I70203 Unspecified atherosclerosis of native arteries of extremities, bilateral legs: Secondary | ICD-10-CM | POA: Diagnosis not present

## 2017-10-02 DIAGNOSIS — E785 Hyperlipidemia, unspecified: Secondary | ICD-10-CM | POA: Insufficient documentation

## 2017-10-02 DIAGNOSIS — M7989 Other specified soft tissue disorders: Secondary | ICD-10-CM | POA: Diagnosis not present

## 2017-10-04 ENCOUNTER — Encounter: Payer: Self-pay | Admitting: Cardiovascular Disease

## 2017-10-04 ENCOUNTER — Ambulatory Visit (INDEPENDENT_AMBULATORY_CARE_PROVIDER_SITE_OTHER): Payer: Medicare Other | Admitting: Cardiovascular Disease

## 2017-10-04 VITALS — BP 141/76 | HR 99 | Ht <= 58 in | Wt 145.2 lb

## 2017-10-04 DIAGNOSIS — I70229 Atherosclerosis of native arteries of extremities with rest pain, unspecified extremity: Secondary | ICD-10-CM

## 2017-10-04 DIAGNOSIS — I998 Other disorder of circulatory system: Secondary | ICD-10-CM

## 2017-10-04 HISTORY — DX: Atherosclerosis of native arteries of extremities with rest pain, unspecified extremity: I70.229

## 2017-10-04 NOTE — Assessment & Plan Note (Signed)
Kristin [name] was referred to me by Dr. Dellia Nims for right lower extremity critical limb ischemia. She developed a right lateral malleolar ulcer approximately 2 months ago which has gotten progressively worse. She is not diabetic nor has she smoked. Dopplers performed on 10/02/17 revealed a right ABI of 0.77 with tibial vessel disease. She has preserved renal function. My plan is to perform angiography and potential endovascular therapy for CLI/ limb  salvage on Monday morning.

## 2017-10-04 NOTE — Patient Instructions (Signed)
   [place] [address] [place] [address] Dept: [phone] Loc: [phone]  Kristin [name]  10/04/2017  You are scheduled for a Peripheral Angiogram on Monday, February 25 with Dr. Quay Burow.  1. Please arrive at the [facility] (Main Entrance A) at [facility]: [address] [place], [place] [address] at 5:30 AM (two hours before your procedure to ensure your preparation). Free valet parking service is available.   Special note: Every effort is made to have your procedure done on time. Please understand that emergencies sometimes delay scheduled procedures.  2. Diet: Do not eat or drink anything after midnight prior to your procedure except sips of water to take medications.  3. Labs: Please have pre-procedure blood work drawn today in our office.  4. Medication instructions in preparation for your procedure:  On the morning of your procedure, take your Aspirin and any morning medicines NOT listed above.  You may use sips of water.  5. Plan for one night stay--bring personal belongings. 6. Bring a current list of your medications and current insurance cards. 7. You MUST have a responsible person to drive you home. 8. Someone MUST be with you the first 24 hours after you arrive home or your discharge will be delayed. 9. Please wear clothes that are easy to get on and off and wear slip-on shoes.  Thank you for allowing [place] to care for you!   -- [facility]   Post-procedure Follow-Up:  1 WEEK AFTER PROCEDURE. Your physician has requested that you have a lower extremity arterial duplex. During this test, ultrasound is used to evaluate arterial blood flow in the legs. Allow one hour for this exam. There are no restrictions or special instructions.  Your physician has requested that you have an ankle brachial index (ABI). During this test  an ultrasound and blood pressure cuff are used to evaluate the arteries that supply the arms and legs with blood. Allow thirty minutes for this exam. There are no restrictions or special instructions.  Your physician recommends that you schedule a follow-up appointment in: 2 weeks with Dr. Gwenlyn Found after procedure.

## 2017-10-04 NOTE — Progress Notes (Signed)
10/04/2017 Kristin [name]   [birth year]  [id number]  Primary Physician [name], MD Primary Cardiologist: Lorretta Harp MD [name], [place], [place]  HPI:  Kristin [name] is a 84 y.o. moderately overweight divorced African-American female mother of 5 children, grandmother and 7 grandchildren and is accompanied by one of her daughters today. She was was referred by Dr. Dellia Nims for peripheral vascular evaluation because of critical limb ischemia and a nonhealing right lateral malleolar ulcer. She has a history of hypertension and hyperlipidemia. She's never had a heart attack or stroke. She denies chest pain or shortness of breath. She does have multiple myeloma. She developed a right lateral malleolar ulcer 2 months ago which has gotten progressively worse. Dopplers performed in our office on 10/02/17 revealed tibial vessel disease with a right ABI of 0.77. She hasn't been on several rounds of antibiotics.   Current Meds  Medication Sig  . aspirin 81 MG chewable tablet Chew 81 mg by mouth daily.  . clotrimazole-betamethasone (LOTRISONE) cream APPLY TOPICALLY TO AFFECTED AREA EVERY DAY  . feeding supplement, ENSURE ENLIVE, (ENSURE ENLIVE) LIQD Take 237 mLs by mouth 2 (two) times daily between meals.  Marland Kitchen lenalidomide (REVLIMID) 10 MG capsule Take 1 capsule (10 mg total) by mouth daily. Take Revlimid 10 mg by mouth daily for  21 days ;  Off  7 days.  . magnesium oxide (MAG-OX) 400 (241.3 Mg) MG tablet Take 1 tablet (400 mg total) by mouth daily.  . meclizine (ANTIVERT) 25 MG tablet TAKE ONE TABLET BY MOUTH THREE TIMES DAILY AS NEEDED  . megestrol (MEGACE) 40 MG tablet Take 1 tablet (40 mg total) by mouth daily.  . mirtazapine (REMERON) 15 MG tablet Take 1 tablet (15 mg total) by mouth at bedtime.  Marland Kitchen morphine ([name]) 30 MG 12 hr tablet Take 1 tablet (30 mg total) by mouth every 12 (twelve) hours.  . ondansetron (ZOFRAN ODT) 4 MG disintegrating tablet Take 1-2 tablets (4-8 mg total)  by mouth every 8 (eight) hours as needed for nausea or vomiting.  . Oxycodone HCl 10 MG TABS Take 1 tablet (10 mg total) by mouth every 6 (six) hours as needed. FOR MODERATE OR SEVERE PAIN  . RESTASIS 0.05 % ophthalmic emulsion Place 1 drop into both eyes daily.   . risperiDONE (RISPERDAL) 0.5 MG tablet Take 0.5 mg by mouth 2 (two) times daily.  . sennosides-docusate sodium (SENOKOT-S) 8.6-50 MG tablet Take 2 tablets by mouth daily.  . [DISCONTINUED] doxycycline (MONODOX) 100 MG capsule Take 1 capsule by mouth 2 (two) times daily.  . [DISCONTINUED] furosemide (LASIX) 20 MG tablet Take 20 mg by mouth daily.     Allergies  Allergen Reactions  . Penicillins Rash    Has patient had a PCN reaction causing immediate rash, facial/tongue/throat swelling, SOB or lightheadedness with hypotension: no Has patient had a PCN reaction causing severe rash involving mucus membranes or skin necrosis: no Has patient had a PCN reaction that required hospitalization no Has patient had a PCN reaction occurring within the last 10 years:no If all of the above answers are "NO", then may proceed with Cephalosporin use.    . Alendronate Nausea Only and Other (See Comments)    Hip pain  . Sulfonamide Derivatives Itching and Rash    Social History   Socioeconomic History  . Marital status: Widowed    Spouse name: Not on file  . Number of children: Not on file  . Years of  education: Not on file  . Highest education level: Not on file  Social Needs  . Financial resource strain: Not on file  . Food insecurity - worry: Not on file  . Food insecurity - inability: Not on file  . Transportation needs - medical: Not on file  . Transportation needs - non-medical: Not on file  Occupational History  . Not on file  Tobacco Use  . Smoking status: Never Smoker  . Smokeless tobacco: Never Used  Substance and Sexual Activity  . Alcohol use: Yes    Comment: 09/23/2016 "glass of wine a few times/year"  . Drug use: No    . Sexual activity: No  Other Topics Concern  . Not on file  Social History Narrative  . Not on file     Review of Systems: General: negative for chills, fever, night sweats or weight changes.  Cardiovascular: negative for chest pain, dyspnea on exertion, edema, orthopnea, palpitations, paroxysmal nocturnal dyspnea or shortness of breath Dermatological: negative for rash Respiratory: negative for cough or wheezing Urologic: negative for hematuria Abdominal: negative for nausea, vomiting, diarrhea, bright red blood per rectum, melena, or hematemesis Neurologic: negative for visual changes, syncope, or dizziness All other systems reviewed and are otherwise negative except as noted above.    Blood pressure (!) 141/76, pulse 99, height 4' 9"  (1.448 m), weight 145 lb 3.2 oz (65.9 kg).  General appearance: alert and no distress Neck: no adenopathy, no carotid bruit, no JVD, supple, symmetrical, trachea midline and thyroid not enlarged, symmetric, no tenderness/mass/nodules Lungs: clear to auscultation bilaterally Heart: regular rate and rhythm, S1, S2 normal, no murmur, click, rub or gallop Extremities: extremities normal, atraumatic, no cyanosis or edema Pulses: Absent pedal pulses Skin: Deep ulcer right lateral malleolus Neurologic: Alert and oriented X 3, normal strength and tone. Normal symmetric reflexes. Normal coordination and gait  EKG sinus rhythm at 99 with left axis deviation. I  Personally reviewed this EKG  ASSESSMENT AND PLAN:   Critical lower limb ischemia Kristin [name] was referred to me by Dr. Dellia Nims for right lower extremity critical limb ischemia. She developed a right lateral malleolar ulcer approximately 2 months ago which has gotten progressively worse. She is not diabetic nor has she smoked. Dopplers performed on 10/02/17 revealed a right ABI of 0.77 with tibial vessel disease. She has preserved renal function. My plan is to perform angiography and potential endovascular  therapy for CLI/ limb  salvage on Monday morning.      Lorretta Harp MD FACP,FACC,FAHA, [facility] 10/04/2017 12:18 PM

## 2017-10-04 NOTE — H&P (View-Only) (Signed)
10/04/2017 Kristin [name]   June 04, 1931  [id number]  Primary Physician [name], MD Primary Cardiologist: Lorretta Harp MD Kristin [name], [place], [place]  HPI:  Kristin [name] is a 84 y.o. moderately overweight divorced African-American female mother of 5 children, grandmother and 7 grandchildren and is accompanied by one of her daughters today. She was was referred by Dr. Dellia Nims for peripheral vascular evaluation because of critical limb ischemia and a nonhealing right lateral malleolar ulcer. She has a history of hypertension and hyperlipidemia. She's never had a heart attack or stroke. She denies chest pain or shortness of breath. She does have multiple myeloma. She developed a right lateral malleolar ulcer 2 months ago which has gotten progressively worse. Dopplers performed in our office on 10/02/17 revealed tibial vessel disease with a right ABI of 0.77. She hasn't been on several rounds of antibiotics.   Current Meds  Medication Sig  . aspirin 81 MG chewable tablet Chew 81 mg by mouth daily.  . clotrimazole-betamethasone (LOTRISONE) cream APPLY TOPICALLY TO AFFECTED AREA EVERY DAY  . feeding supplement, ENSURE ENLIVE, (ENSURE ENLIVE) LIQD Take 237 mLs by mouth 2 (two) times daily between meals.  Marland Kitchen lenalidomide (REVLIMID) 10 MG capsule Take 1 capsule (10 mg total) by mouth daily. Take Revlimid 10 mg by mouth daily for  21 days ;  Off  7 days.  . magnesium oxide (MAG-OX) 400 (241.3 Mg) MG tablet Take 1 tablet (400 mg total) by mouth daily.  . meclizine (ANTIVERT) 25 MG tablet TAKE ONE TABLET BY MOUTH THREE TIMES DAILY AS NEEDED  . megestrol (MEGACE) 40 MG tablet Take 1 tablet (40 mg total) by mouth daily.  . mirtazapine (REMERON) 15 MG tablet Take 1 tablet (15 mg total) by mouth at bedtime.  Marland Kitchen morphine ([name]) 30 MG 12 hr tablet Take 1 tablet (30 mg total) by mouth every 12 (twelve) hours.  . ondansetron (ZOFRAN ODT) 4 MG disintegrating tablet Take 1-2 tablets (4-8 mg total)  by mouth every 8 (eight) hours as needed for nausea or vomiting.  . Oxycodone HCl 10 MG TABS Take 1 tablet (10 mg total) by mouth every 6 (six) hours as needed. FOR MODERATE OR SEVERE PAIN  . RESTASIS 0.05 % ophthalmic emulsion Place 1 drop into both eyes daily.   . risperiDONE (RISPERDAL) 0.5 MG tablet Take 0.5 mg by mouth 2 (two) times daily.  . sennosides-docusate sodium (SENOKOT-S) 8.6-50 MG tablet Take 2 tablets by mouth daily.  . [DISCONTINUED] doxycycline (MONODOX) 100 MG capsule Take 1 capsule by mouth 2 (two) times daily.  . [DISCONTINUED] furosemide (LASIX) 20 MG tablet Take 20 mg by mouth daily.     Allergies  Allergen Reactions  . Penicillins Rash    Has patient had a PCN reaction causing immediate rash, facial/tongue/throat swelling, SOB or lightheadedness with hypotension: no Has patient had a PCN reaction causing severe rash involving mucus membranes or skin necrosis: no Has patient had a PCN reaction that required hospitalization no Has patient had a PCN reaction occurring within the last 10 years:no If all of the above answers are "NO", then may proceed with Cephalosporin use.    . Alendronate Nausea Only and Other (See Comments)    Hip pain  . Sulfonamide Derivatives Itching and Rash    Social History   Socioeconomic History  . Marital status: Widowed    Spouse name: Not on file  . Number of children: Not on file  . Years of  education: Not on file  . Highest education level: Not on file  Social Needs  . Financial resource strain: Not on file  . Food insecurity - worry: Not on file  . Food insecurity - inability: Not on file  . Transportation needs - medical: Not on file  . Transportation needs - non-medical: Not on file  Occupational History  . Not on file  Tobacco Use  . Smoking status: Never Smoker  . Smokeless tobacco: Never Used  Substance and Sexual Activity  . Alcohol use: Yes    Comment: 09/23/2016 "glass of wine a few times/year"  . Drug use: No    . Sexual activity: No  Other Topics Concern  . Not on file  Social History Narrative  . Not on file     Review of Systems: General: negative for chills, fever, night sweats or weight changes.  Cardiovascular: negative for chest pain, dyspnea on exertion, edema, orthopnea, palpitations, paroxysmal nocturnal dyspnea or shortness of breath Dermatological: negative for rash Respiratory: negative for cough or wheezing Urologic: negative for hematuria Abdominal: negative for nausea, vomiting, diarrhea, bright red blood per rectum, melena, or hematemesis Neurologic: negative for visual changes, syncope, or dizziness All other systems reviewed and are otherwise negative except as noted above.    Blood pressure (!) 141/76, pulse 99, height 4' 9"  (1.448 m), weight 145 lb 3.2 oz (65.9 kg).  General appearance: alert and no distress Neck: no adenopathy, no carotid bruit, no JVD, supple, symmetrical, trachea midline and thyroid not enlarged, symmetric, no tenderness/mass/nodules Lungs: clear to auscultation bilaterally Heart: regular rate and rhythm, S1, S2 normal, no murmur, click, rub or gallop Extremities: extremities normal, atraumatic, no cyanosis or edema Pulses: Absent pedal pulses Skin: Deep ulcer right lateral malleolus Neurologic: Alert and oriented X 3, normal strength and tone. Normal symmetric reflexes. Normal coordination and gait  EKG sinus rhythm at 99 with left axis deviation. I  Personally reviewed this EKG  ASSESSMENT AND PLAN:   Critical lower limb ischemia [name] was referred to me by Dr. Dellia Nims for right lower extremity critical limb ischemia. She developed a right lateral malleolar ulcer approximately 2 months ago which has gotten progressively worse. She is not diabetic nor has she smoked. Dopplers performed on 10/02/17 revealed a right ABI of 0.77 with tibial vessel disease. She has preserved renal function. My plan is to perform angiography and potential endovascular  therapy for CLI/ limb  salvage on Monday morning.      Lorretta Harp MD FACP,FACC,FAHA, [facility] 10/04/2017 12:18 PM

## 2017-10-06 LAB — CBC WITH DIFFERENTIAL/PLATELET
BASOS ABS: 0.1 10*3/uL (ref 0.0–0.2)
Basos: 1 %
EOS (ABSOLUTE): 0.1 10*3/uL (ref 0.0–0.4)
EOS: 3 %
HEMATOCRIT: 32.6 % — AB (ref 34.0–46.6)
HEMOGLOBIN: 10.6 g/dL — AB (ref 11.1–15.9)
Immature Grans (Abs): 0 10*3/uL (ref 0.0–0.1)
Immature Granulocytes: 0 %
LYMPHS ABS: 1.5 10*3/uL (ref 0.7–3.1)
LYMPHS: 32 %
MCH: 32.1 pg (ref 26.6–33.0)
MCHC: 32.5 g/dL (ref 31.5–35.7)
MCV: 99 fL — ABNORMAL HIGH (ref 79–97)
Monocytes Absolute: 0.7 10*3/uL (ref 0.1–0.9)
Monocytes: 14 %
NEUTROS ABS: 2.5 10*3/uL (ref 1.4–7.0)
NEUTROS PCT: 50 %
Platelets: 228 10*3/uL (ref 150–379)
RBC: 3.3 x10E6/uL — ABNORMAL LOW (ref 3.77–5.28)
RDW: 16 % — ABNORMAL HIGH (ref 12.3–15.4)
WBC: 4.9 10*3/uL (ref 3.4–10.8)

## 2017-10-06 LAB — BASIC METABOLIC PANEL
BUN/Creatinine Ratio: 13 (ref 12–28)
BUN: 15 mg/dL (ref 8–27)
CALCIUM: 8.7 mg/dL (ref 8.7–10.3)
CO2: 20 mmol/L (ref 20–29)
CREATININE: 1.12 mg/dL — AB (ref 0.57–1.00)
Chloride: 103 mmol/L (ref 96–106)
GFR calc Af Amer: 52 mL/min/{1.73_m2} — ABNORMAL LOW (ref >59)
GFR, EST NON AFRICAN AMERICAN: 45 mL/min/{1.73_m2} — AB (ref >59)
GLUCOSE: 102 mg/dL — AB (ref 65–99)
POTASSIUM: 3.6 mmol/L (ref 3.5–5.2)
SODIUM: 142 mmol/L (ref 134–144)

## 2017-10-06 LAB — APTT: APTT: 32 s (ref 24–33)

## 2017-10-06 LAB — PROTIME-INR
INR: 1 (ref 0.8–1.2)
Prothrombin Time: 10.4 s (ref 9.1–12.0)

## 2017-10-09 ENCOUNTER — Ambulatory Visit (HOSPITAL_COMMUNITY)
Admission: RE | Admit: 2017-10-09 | Discharge: 2017-10-10 | Disposition: A | Discharge status: Home / Self Care | Payer: Medicare Other | Source: Ambulatory Visit | Attending: Cardiovascular Disease | Admitting: Cardiovascular Disease

## 2017-10-09 ENCOUNTER — Other Ambulatory Visit: Payer: Self-pay

## 2017-10-09 ENCOUNTER — Encounter (HOSPITAL_COMMUNITY): Payer: Self-pay | Admitting: General Practice

## 2017-10-09 ENCOUNTER — Ambulatory Visit (HOSPITAL_COMMUNITY)
Admission: RE | Disposition: A | Discharge status: Home / Self Care | Payer: Self-pay | Source: Ambulatory Visit | Attending: Cardiovascular Disease

## 2017-10-09 DIAGNOSIS — Z888 Allergy status to other drugs, medicaments and biological substances status: Secondary | ICD-10-CM | POA: Insufficient documentation

## 2017-10-09 DIAGNOSIS — L97319 Non-pressure chronic ulcer of right ankle with unspecified severity: Secondary | ICD-10-CM | POA: Insufficient documentation

## 2017-10-09 DIAGNOSIS — E785 Hyperlipidemia, unspecified: Secondary | ICD-10-CM | POA: Diagnosis not present

## 2017-10-09 DIAGNOSIS — C9000 Multiple myeloma not having achieved remission: Secondary | ICD-10-CM | POA: Insufficient documentation

## 2017-10-09 DIAGNOSIS — I70229 Atherosclerosis of native arteries of extremities with rest pain, unspecified extremity: Secondary | ICD-10-CM

## 2017-10-09 DIAGNOSIS — Z882 Allergy status to sulfonamides status: Secondary | ICD-10-CM | POA: Diagnosis not present

## 2017-10-09 DIAGNOSIS — Z880 Allergy status to penicillin: Secondary | ICD-10-CM | POA: Insufficient documentation

## 2017-10-09 DIAGNOSIS — Z7982 Long term (current) use of aspirin: Secondary | ICD-10-CM | POA: Insufficient documentation

## 2017-10-09 DIAGNOSIS — I998 Other disorder of circulatory system: Secondary | ICD-10-CM | POA: Diagnosis present

## 2017-10-09 DIAGNOSIS — I70233 Atherosclerosis of native arteries of right leg with ulceration of ankle: Secondary | ICD-10-CM | POA: Insufficient documentation

## 2017-10-09 DIAGNOSIS — I10 Essential (primary) hypertension: Secondary | ICD-10-CM | POA: Insufficient documentation

## 2017-10-09 DIAGNOSIS — Z79899 Other long term (current) drug therapy: Secondary | ICD-10-CM | POA: Diagnosis not present

## 2017-10-09 DIAGNOSIS — Z79891 Long term (current) use of opiate analgesic: Secondary | ICD-10-CM | POA: Insufficient documentation

## 2017-10-09 DIAGNOSIS — I70238 Atherosclerosis of native arteries of right leg with ulceration of other part of lower right leg: Secondary | ICD-10-CM | POA: Diagnosis not present

## 2017-10-09 HISTORY — PX: PERIPHERAL VASCULAR BALLOON ANGIOPLASTY: CATH118281

## 2017-10-09 HISTORY — PX: LOWER EXTREMITY ANGIOGRAPHY: CATH118251

## 2017-10-09 HISTORY — DX: Malignant neoplasm of bone and articular cartilage, unspecified: C41.9

## 2017-10-09 HISTORY — DX: Unspecified osteoarthritis, unspecified site: M19.90

## 2017-10-09 HISTORY — DX: Peripheral vascular disease, unspecified: I73.9

## 2017-10-09 LAB — POCT ACTIVATED CLOTTING TIME
ACTIVATED CLOTTING TIME: 230 s
ACTIVATED CLOTTING TIME: 263 s
ACTIVATED CLOTTING TIME: 180 s
Activated Clotting Time: 191 seconds

## 2017-10-09 SURGERY — LOWER EXTREMITY ANGIOGRAPHY
Anesthesia: LOCAL

## 2017-10-09 MED ORDER — SODIUM CHLORIDE 0.9 % IV SOLN: INTRAVENOUS | Status: AC

## 2017-10-09 MED ORDER — LIDOCAINE HCL (PF) 1 % IJ SOLN
INTRAMUSCULAR | Status: DC | PRN
  Administered 2017-10-09: 25 mL

## 2017-10-09 MED ORDER — SODIUM CHLORIDE 0.9 % WEIGHT BASED INFUSION: 1.0000 mL/kg/h | INTRAVENOUS | Status: DC

## 2017-10-09 MED ORDER — MORPHINE SULFATE (PF) 4 MG/ML IV SOLN
2.0000 mg | INTRAVENOUS | Status: DC | PRN
  Administered 2017-10-09 – 2017-10-10 (×5): 2 mg via INTRAVENOUS
  Filled 2017-10-09 (×5): qty 1

## 2017-10-09 MED ORDER — ASPIRIN 81 MG PO CHEW: 81.0000 mg | CHEWABLE_TABLET | ORAL | Status: DC

## 2017-10-09 MED ORDER — HYDRALAZINE HCL 20 MG/ML IJ SOLN: 5.0000 mg | INTRAMUSCULAR | Status: DC | PRN

## 2017-10-09 MED ORDER — FENTANYL CITRATE (PF) 100 MCG/2ML IJ SOLN
INTRAMUSCULAR | Status: DC | PRN
  Administered 2017-10-09 (×2): 25 ug via INTRAVENOUS

## 2017-10-09 MED ORDER — SODIUM CHLORIDE 0.9% FLUSH: 3.0000 mL | INTRAVENOUS | Status: DC | PRN

## 2017-10-09 MED ORDER — FENTANYL CITRATE (PF) 100 MCG/2ML IJ SOLN
INTRAMUSCULAR | Status: AC
  Filled 2017-10-09: qty 2

## 2017-10-09 MED ORDER — LIDOCAINE HCL 1 % IJ SOLN
INTRAMUSCULAR | Status: AC
  Filled 2017-10-09: qty 20

## 2017-10-09 MED ORDER — ASPIRIN 81 MG PO CHEW
81.0000 mg | CHEWABLE_TABLET | Freq: Every day | ORAL | Status: DC
  Administered 2017-10-10: 81 mg via ORAL
  Filled 2017-10-09: qty 1

## 2017-10-09 MED ORDER — FUROSEMIDE 20 MG PO TABS
20.0000 mg | ORAL_TABLET | Freq: Every day | ORAL | Status: DC
  Administered 2017-10-10: 09:00:00 20 mg via ORAL
  Filled 2017-10-09: qty 1

## 2017-10-09 MED ORDER — ATORVASTATIN CALCIUM 80 MG PO TABS
80.0000 mg | ORAL_TABLET | Freq: Every day | ORAL | Status: DC
  Filled 2017-10-09: qty 1

## 2017-10-09 MED ORDER — ONDANSETRON HCL 4 MG/2ML IJ SOLN: 4.0000 mg | Freq: Four times a day (QID) | INTRAMUSCULAR | Status: DC | PRN

## 2017-10-09 MED ORDER — SODIUM CHLORIDE 0.9 % IV SOLN: 250.0000 mL | INTRAVENOUS | Status: DC | PRN

## 2017-10-09 MED ORDER — IODIXANOL 320 MG/ML IV SOLN
INTRAVENOUS | Status: DC | PRN
  Administered 2017-10-09: 225 mL via INTRA_ARTERIAL

## 2017-10-09 MED ORDER — LABETALOL HCL 5 MG/ML IV SOLN: 10.0000 mg | INTRAVENOUS | Status: DC | PRN

## 2017-10-09 MED ORDER — SODIUM CHLORIDE 0.9 % WEIGHT BASED INFUSION
3.0000 mL/kg/h | INTRAVENOUS | Status: DC
  Administered 2017-10-09: 3 mL/kg/h via INTRAVENOUS

## 2017-10-09 MED ORDER — MIDAZOLAM HCL 2 MG/2ML IJ SOLN
INTRAMUSCULAR | Status: DC | PRN
  Administered 2017-10-09: 1 mg via INTRAVENOUS

## 2017-10-09 MED ORDER — HEPARIN (PORCINE) IN NACL 2-0.9 UNIT/ML-% IJ SOLN
INTRAMUSCULAR | Status: AC
  Filled 2017-10-09: qty 1000

## 2017-10-09 MED ORDER — MIDAZOLAM HCL 2 MG/2ML IJ SOLN
INTRAMUSCULAR | Status: AC
  Filled 2017-10-09: qty 2

## 2017-10-09 MED ORDER — ACETAMINOPHEN 325 MG PO TABS
650.0000 mg | ORAL_TABLET | ORAL | Status: DC | PRN
  Filled 2017-10-09: qty 2

## 2017-10-09 MED ORDER — MORPHINE SULFATE (PF) 4 MG/ML IV SOLN
INTRAVENOUS | Status: AC
  Filled 2017-10-09: qty 1

## 2017-10-09 MED ORDER — SODIUM CHLORIDE 0.9% FLUSH: 3.0000 mL | Freq: Two times a day (BID) | INTRAVENOUS | Status: DC

## 2017-10-09 MED ORDER — SODIUM CHLORIDE 0.9% FLUSH
3.0000 mL | Freq: Two times a day (BID) | INTRAVENOUS | Status: DC
  Administered 2017-10-10: 3 mL via INTRAVENOUS

## 2017-10-09 MED ORDER — HEPARIN SODIUM (PORCINE) 1000 UNIT/ML IJ SOLN
INTRAMUSCULAR | Status: DC | PRN
  Administered 2017-10-09: 7000 [IU] via INTRAVENOUS

## 2017-10-09 MED ORDER — HEPARIN SODIUM (PORCINE) 1000 UNIT/ML IJ SOLN
INTRAMUSCULAR | Status: AC
  Filled 2017-10-09: qty 1

## 2017-10-09 MED ORDER — MORPHINE SULFATE (PF) 10 MG/ML IV SOLN
2.0000 mg | INTRAVENOUS | Status: DC | PRN
  Administered 2017-10-09 (×2): 2 mg via INTRAVENOUS

## 2017-10-09 MED ORDER — HEPARIN (PORCINE) IN NACL 2-0.9 UNIT/ML-% IJ SOLN
INTRAMUSCULAR | Status: AC | PRN
  Administered 2017-10-09 (×2): 500 mL

## 2017-10-09 MED ORDER — ASPIRIN EC 81 MG PO TBEC: 81.0000 mg | DELAYED_RELEASE_TABLET | Freq: Every day | ORAL | Status: DC

## 2017-10-09 MED ORDER — SODIUM CHLORIDE 0.9% FLUSH
3.0000 mL | INTRAVENOUS | Status: DC | PRN
  Administered 2017-10-09: 20:00:00 3 mL via INTRAVENOUS
  Filled 2017-10-09: qty 3

## 2017-10-09 SURGICAL SUPPLY — 23 items
BAG SNAP BAND KOVER 36X36 (MISCELLANEOUS) ×1 IMPLANT
CATH ANGIO 5F PIGTAIL 65CM (CATHETERS) ×1 IMPLANT
CATH CXI SUPP ST 2.6FR 150CM (CATHETERS) ×1 IMPLANT
CATH STRAIGHT 5FR 65CM (CATHETERS) ×1 IMPLANT
CATH TEMPO 5F RIM 65CM (CATHETERS) ×1 IMPLANT
DEVICE TORQUE .014-.018 (MISCELLANEOUS) IMPLANT
GLIDEWIRE ANGLED NITR .018X260 (WIRE) ×1 IMPLANT
GUIDEWIRE ANGLED .035X150CM (WIRE) ×1 IMPLANT
GUIDEWIRE ASTATO XS 20G 300CM (WIRE) ×1 IMPLANT
KIT ESSENTIALS PG (KITS) ×1 IMPLANT
KIT PV (KITS) ×3 IMPLANT
SHEATH HIGHFLEX ANSEL 6FRX55 (SHEATH) ×1 IMPLANT
SHEATH PINNACLE 5F 10CM (SHEATH) ×1 IMPLANT
SHEATH PINNACLE 6F 10CM (SHEATH) ×1 IMPLANT
SYRINGE MEDRAD AVANTA MACH 7 (SYRINGE) ×1 IMPLANT
TAPE VIPERTRACK RADIOPAQ (MISCELLANEOUS) IMPLANT
TAPE VIPERTRACK RADIOPAQUE (MISCELLANEOUS) ×3
TORQUE DEVICE .014-.018 (MISCELLANEOUS) ×3
TRANSDUCER W/STOPCOCK (MISCELLANEOUS) ×3 IMPLANT
TRAY PV CATH (CUSTOM PROCEDURE TRAY) ×3 IMPLANT
WIRE BENTSON .035X145CM (WIRE) ×1 IMPLANT
WIRE HITORQ VERSACORE ST 145CM (WIRE) ×1 IMPLANT
WIRE ROSEN-J .035X180CM (WIRE) ×1 IMPLANT

## 2017-10-09 NOTE — Progress Notes (Signed)
Purewick in place, draining clear yellow urine into wall cannister

## 2017-10-09 NOTE — Plan of Care (Signed)
  Progressing Education: Knowledge of General Education information will improve 10/09/2017 2140 - Progressing by Randal Buba, RN Elimination: Will not experience complications related to bowel motility 10/09/2017 2140 - Progressing by Randal Buba, RN Will not experience complications related to urinary retention 10/09/2017 2140 - Progressing by Randal Buba, RN Pain Managment: General experience of comfort will improve 10/09/2017 2140 - Progressing by Randal Buba, RN

## 2017-10-09 NOTE — Progress Notes (Signed)
43f sheath aspirated and removed from LFA by DEddie DibblesR.N.  I took over manual pressure and continued for 20 minutes, site level 0 , tegaderm dressing applied, bedrest instructions given.   Right dp faint with dopper, left peroneal pulse present with doppler.  Bedrest begins at 11:35:00

## 2017-10-09 NOTE — Interval H&P Note (Signed)
History and Physical Interval Note:  10/09/2017 7:27 AM  Kristin [name]  has presented today for surgery, with the diagnosis of ischemia  The various methods of treatment have been discussed with the patient and family. After consideration of risks, benefits and other options for treatment, the patient has consented to  Procedure(s): LOWER EXTREMITY ANGIOGRAPHY (N/A) as a surgical intervention .  The patient's history has been reviewed, patient examined, no change in status, stable for surgery.  I have reviewed the patient's chart and labs.  Questions were answered to the patient's satisfaction.     Quay Burow

## 2017-10-10 ENCOUNTER — Encounter (HOSPITAL_COMMUNITY): Payer: Self-pay | Admitting: Cardiovascular Disease

## 2017-10-10 DIAGNOSIS — I998 Other disorder of circulatory system: Secondary | ICD-10-CM | POA: Diagnosis not present

## 2017-10-10 DIAGNOSIS — R2681 Unsteadiness on feet: Secondary | ICD-10-CM

## 2017-10-10 DIAGNOSIS — I70233 Atherosclerosis of native arteries of right leg with ulceration of ankle: Secondary | ICD-10-CM | POA: Diagnosis not present

## 2017-10-10 DIAGNOSIS — I739 Peripheral vascular disease, unspecified: Secondary | ICD-10-CM

## 2017-10-10 LAB — BASIC METABOLIC PANEL
Anion gap: 10 (ref 5–15)
BUN: 11 mg/dL (ref 6–20)
CALCIUM: 7.9 mg/dL — AB (ref 8.9–10.3)
CO2: 24 mmol/L (ref 22–32)
Chloride: 106 mmol/L (ref 101–111)
Creatinine, Ser: 1.02 mg/dL — ABNORMAL HIGH (ref 0.44–1.00)
GFR calc Af Amer: 56 mL/min — ABNORMAL LOW (ref >60)
GFR, EST NON AFRICAN AMERICAN: 49 mL/min — AB (ref >60)
GLUCOSE: 91 mg/dL (ref 65–99)
Potassium: 3.3 mmol/L — ABNORMAL LOW (ref 3.5–5.1)
Sodium: 140 mmol/L (ref 135–145)

## 2017-10-10 LAB — CBC
HCT: 28.5 % — ABNORMAL LOW (ref 36.0–46.0)
HEMOGLOBIN: 9.5 g/dL — AB (ref 12.0–15.0)
MCH: 31.8 pg (ref 26.0–34.0)
MCHC: 33.3 g/dL (ref 30.0–36.0)
MCV: 95.3 fL (ref 78.0–100.0)
Platelets: 198 10*3/uL (ref 150–400)
RBC: 2.99 MIL/uL — ABNORMAL LOW (ref 3.87–5.11)
RDW: 15.7 % — AB (ref 11.5–15.5)
WBC: 5.1 10*3/uL (ref 4.0–10.5)

## 2017-10-10 MED ORDER — POTASSIUM CHLORIDE CRYS ER 20 MEQ PO TBCR
40.0000 meq | EXTENDED_RELEASE_TABLET | Freq: Once | ORAL | Status: AC
  Administered 2017-10-10: 14:00:00 40 meq via ORAL
  Filled 2017-10-10: qty 2

## 2017-10-10 MED FILL — Lidocaine HCl Local Inj 1%: INTRAMUSCULAR | Qty: 40 | Status: AC

## 2017-10-10 MED FILL — Morphine Sulfate Inj 4 MG/ML: INTRAMUSCULAR | Qty: 0.5 | Status: AC

## 2017-10-10 MED FILL — Heparin Sodium (Porcine) 2 Unit/ML in Sodium Chloride 0.9%: INTRAMUSCULAR | Qty: 1000 | Status: AC

## 2017-10-10 NOTE — Discharge Summary (Addendum)
Discharge Summary    Patient ID: Kristin [name],  MRN: [id number], DOB/AGE: October 01, 1930 84 y.o.  Admit date: 10/09/2017 Discharge date: 10/10/2017  Primary Care Provider: [name] Primary Cardiologist: Dr. Gwenlyn Found   Discharge Diagnoses    Active Problems:   Critical lower limb ischemia   Allergies Allergies  Allergen Reactions  . Penicillins Rash and Other (See Comments)    Has patient had a PCN reaction causing immediate rash, facial/tongue/throat swelling, SOB or lightheadedness with hypotension: no Has patient had a PCN reaction causing severe rash involving mucus membranes or skin necrosis: no Has patient had a PCN reaction that required hospitalization no Has patient had a PCN reaction occurring within the last 10 years:no If all of the above answers are "NO", then may proceed with Cephalosporin use.    . Alendronate Nausea Only and Other (See Comments)    Hip pain  . Sulfonamide Derivatives Itching and Rash    Diagnostic Studies/Procedures    PV angiogram: 10/09/17  Procedure Description: Contralateral access access was obtained with an RN catheter, Glidewire and 6 French 5 cm multipurpose Ansel sheath. The patient received a total of 7000 units of heparin with an ACT of 2:30. Total contrast administered to the patient was 225 mL. I was able to get down to the below the knee tibial vessels with an 018 Glidewire and an 018 150 cm straight CXI end hole  Catheter. I was unable however to cross the peroneal artery with the Glidewire or the 0.14 Astato 20 CTO wire . I ultimately abandon the procedure.  Final Impression: Unsuccessful attempt at perineal PTA. The patient's vessels are diffusely calcified and diseased. The posterior tibial occludes above the ankle and her dorsalis pedis fills by collaterals about the ankle. Her only option is dorsalis pedis access. The Ansel sheath was then exchanged for a short 6 French sheath which was secured in place and the patient  left the lab in stable condition. We will hydrate her overnight and observe given her age. She will be discharged on the morning and see me back in the office next week.  Quay Burow. MD, [facility] _____________   History of Present Illness     84 y.o. female with PMH of HTN and HL who was referred by Dr. Dellia Nims for peripheral vascular evaluation because of critical limb ischemia and a nonhealing right lateral malleolar ulcer. She's never had a heart attack or stroke. She denies chest pain or shortness of breath. She does have multiple myeloma. She developed a right lateral malleolar ulcer 2 months ago which has gotten progressively worse. Dopplers performed in our office on 10/02/17 revealed tibial vessel disease with a right ABI of 0.77. Given this finding she was set up for outpatient PV angiogram.   Hospital Course     She underwent attempted PV intervention to the perineal PTA but unable to access 2/2 diffusely calcified and diseased vessels. DP noted to be filled by collaterals. Plan will be to have her return to the office to discuss possible access via dorsalis pedis. No complications noted post cath. Morning labs were stable. Seen by PT given she was deconditioned and recommended HH PT. This was ordered at discharge.   General: Well developed, well nourished, female appearing in no acute distress. Head: Normocephalic, atraumatic.  Neck: Supple without bruits, JVD. Lungs:  Resp regular and unlabored, CTA. Heart: RRR, S1, S2, no S3, S4, or murmur; no rub. Abdomen: Soft, non-tender, non-distended with normoactive bowel sounds.  No hepatomegaly. No rebound/guarding. No obvious abdominal masses. Extremities: No clubbing, cyanosis, edema.  L femoral cath site stable without bruising or hematoma. Right foot with dry bandage.  Neuro: Alert and oriented X 3. Moves all extremities spontaneously. Psych: Normal affect.  Kristin [name] was seen by Dr. Irish Lack and determined stable for discharge  home. Follow up in the office has been arranged. Medications are listed below.   _____________  Discharge Vitals Blood pressure 131/66, pulse 75, temperature 98.4 F (36.9 C), temperature source Oral, resp. rate (!) 26, height 4' 10"  (1.473 m), weight 145 lb 8.1 oz (66 kg), SpO2 98 %.  Filed Weights   10/09/17 0548 10/10/17 0450  Weight: 146 lb (66.2 kg) 145 lb 8.1 oz (66 kg)    Labs & Radiologic Studies    CBC Recent Labs    10/10/17 0337  WBC 5.1  HGB 9.5*  HCT 28.5*  MCV 95.3  PLT 591   Basic Metabolic Panel Recent Labs    10/10/17 0337  NA 140  K 3.3*  CL 106  CO2 24  GLUCOSE 91  BUN 11  CREATININE 1.02*  CALCIUM 7.9*   Liver Function Tests No results for input(s): AST, ALT, ALKPHOS, BILITOT, PROT, ALBUMIN in the last 72 hours. No results for input(s): LIPASE, AMYLASE in the last 72 hours. Cardiac Enzymes No results for input(s): CKTOTAL, CKMB, CKMBINDEX, TROPONINI in the last 72 hours. BNP Invalid input(s): POCBNP D-Dimer No results for input(s): DDIMER in the last 72 hours. Hemoglobin A1C No results for input(s): HGBA1C in the last 72 hours. Fasting Lipid Panel No results for input(s): CHOL, HDL, LDLCALC, TRIG, CHOLHDL, LDLDIRECT in the last 72 hours. Thyroid Function Tests No results for input(s): TSH, T4TOTAL, T3FREE, THYROIDAB in the last 72 hours.  Invalid input(s): FREET3 _____________  Dg Ankle Complete Right  Result Date: 10/03/2017 CLINICAL DATA:  Right ankle pain.  Ulcer. EXAM: RIGHT ANKLE - COMPLETE 3+ VIEW COMPARISON:  None. FINDINGS: Soft tissue ulceration with gas overlies the lateral malleolus. No underlying bone erosion identified. No fractures or dislocations. Diffuse soft tissue edema identified. Vascular calcifications are noted. IMPRESSION: 1. Lateral soft tissue ulceration. No focal bone abnormality identified. 2. Diffuse soft tissue swelling. Electronically Signed   By: Kerby Moors M.D.   On: 10/03/2017 08:17   Disposition    Pt is being discharged home today in good condition.  Follow-up Plans & Appointments    Follow-up Information    [facility] Follow up.   Specialty:  Home Health Services Why:  HHPT Contact information: [address] [place] [address] [phone]        Lorretta Harp, MD Follow up.   Specialties:  Cardiology, Radiology Why:  The office will call you with a follow up appt.  Contact information: [address] [place] [address] [phone]          Discharge Instructions    Call MD for:  redness, tenderness, or signs of infection (pain, swelling, redness, odor or green/yellow discharge around incision site)   Complete by:  As directed    Diet - low sodium heart healthy   Complete by:  As directed    Discharge instructions   Complete by:  As directed    Groin Site Care Refer to this sheet in the next few weeks. These instructions provide you with information on caring for yourself after your procedure. Your caregiver may also give you more specific instructions. Your  treatment has been planned according to current medical practices, but problems sometimes occur. Call your caregiver if you have any problems or questions after your procedure. HOME CARE INSTRUCTIONS You may shower 24 hours after the procedure. Remove the bandage (dressing) and gently wash the site with plain soap and water. Gently pat the site dry.  Do not apply powder or lotion to the site.  Do not sit in a bathtub, swimming pool, or whirlpool for 5 to 7 days.  No bending, squatting, or lifting anything over 10 pounds (4.5 kg) as directed by your caregiver.  Inspect the site at least twice daily.  Do not drive home if you are discharged the same day of the procedure. Have someone else drive you.  You may drive 24 hours after the procedure unless otherwise instructed by your caregiver.  What to expect: Any bruising will usually fade within 1 to 2 weeks.  Blood  that collects in the tissue (hematoma) may be painful to the touch. It should usually decrease in size and tenderness within 1 to 2 weeks.  SEEK IMMEDIATE MEDICAL CARE IF: You have unusual pain at the groin site or down the affected leg.  You have redness, warmth, swelling, or pain at the groin site.  You have drainage (other than a small amount of blood on the dressing).  You have chills.  You have a fever or persistent symptoms for more than 72 hours.  You have a fever and your symptoms suddenly get worse.  Your leg becomes pale, cool, tingly, or numb.  You have heavy bleeding from the site. Hold pressure on the site. .   Face-to-face encounter (required for Medicare/Medicaid patients)   Complete by:  As directed    I Reino Bellis certify that this patient is under my care and that I, or a nurse practitioner or physician's assistant working with me, had a face-to-face encounter that meets the physician face-to-face encounter requirements with this patient on 10/10/2017. The encounter with the patient was in whole, or in part for the following medical condition(s) which is the primary reason for home health care (List medical condition): PVD and deconditioning   The encounter with the patient was in whole, or in part, for the following medical condition, which is the primary reason for home health care:  PVD, deconditioning   I certify that, based on my findings, the following services are medically necessary home health services:  Physical therapy   Reason for Medically Necessary Home Health Services:  Other See Comments   My clinical findings support the need for the above services:  OTHER SEE COMMENTS   Further, I certify that my clinical findings support that this patient is homebound due to:  Unsafe ambulation due to balance issues   Home Health   Complete by:  As directed    To provide the following care/treatments:  PT   Increase activity slowly   Complete by:  As directed        Discharge Medications     Medication List    TAKE these medications   aspirin 81 MG chewable tablet Chew 81 mg by mouth daily.   CALCIUM 600 PO Take 600 mg by mouth 2 (two) times daily.   feeding supplement (ENSURE ENLIVE) Liqd Take 237 mLs by mouth 2 (two) times daily between meals.   furosemide 20 MG tablet Commonly known as:  LASIX Take 20 mg by mouth daily.   lenalidomide 10 MG capsule Commonly known as:  REVLIMID  Take 1 capsule (10 mg total) by mouth daily. Take Revlimid 10 mg by mouth daily for  21 days ;  Off  7 days. What changed:    when to take this  additional instructions   magnesium oxide 400 (241.3 Mg) MG tablet Commonly known as:  MAG-OX Take 1 tablet (400 mg total) by mouth daily.   meclizine 25 MG tablet Commonly known as:  ANTIVERT TAKE ONE TABLET BY MOUTH THREE TIMES DAILY AS NEEDED What changed:    how much to take  how to take this  when to take this  reasons to take this  additional instructions   megestrol 40 MG tablet Commonly known as:  MEGACE Take 1 tablet (40 mg total) by mouth daily.   mirtazapine 15 MG tablet Commonly known as:  REMERON Take 1 tablet (15 mg total) by mouth at bedtime.   morphine 30 MG 12 hr tablet Commonly known as:  [name] Take 1 tablet (30 mg total) by mouth every 12 (twelve) hours.   ondansetron 4 MG disintegrating tablet Commonly known as:  ZOFRAN ODT Take 1-2 tablets (4-8 mg total) by mouth every 8 (eight) hours as needed for nausea or vomiting.   Oxycodone HCl 10 MG Tabs Take 1 tablet (10 mg total) by mouth every 6 (six) hours as needed. FOR MODERATE OR SEVERE PAIN What changed:    reasons to take this  additional instructions   RESTASIS 0.05 % ophthalmic emulsion Generic drug:  cycloSPORINE Place 1 drop into both eyes daily.   risperiDONE 0.5 MG tablet Commonly known as:  RISPERDAL Take 0.5 mg by mouth 2 (two) times daily.   sennosides-docusate sodium 8.6-50 MG tablet Commonly  known as:  SENOKOT-S Take 2 tablets by mouth daily.         Outstanding Labs/Studies   N/a   Duration of Discharge Encounter   Greater than 30 minutes including physician time.  Signed, Reino Bellis NP-C 10/10/2017, 12:37 PM   I have examined the patient and reviewed assessment and plan and discussed with patient.  Agree with above as stated.  S/p attempted revascularization of the right LE.  PT eval done.  Case manager has helped with discharge needs.  GEN: Well nourished, well developed, in no acute distress  HEENT: normal  Neck: no JVD, carotid bruits, or masses Cardiac: RRR; no murmurs, rubs, or gallops,no edema  Respiratory:  clear to auscultation bilaterally, normal work of breathing GI: soft, nontender, nondistended,  MS: no deformity or atrophy ; left groin without hematoma; right leg bandaged Skin: warm and dry, no rash Neuro:  Strength and sensation are intact Psych: euthymic mood, full affect  Plan discharge today.  Repeat attempt at revascularization to be considered by Dr. Gwenlyn Found.    Larae Grooms

## 2017-10-10 NOTE — Care Management Note (Signed)
Case Management Note  Patient Details  Name: Kristin [name] MRN: [id number] Date of Birth: 1930-11-26  Subjective/Objective:   From home alone, she has an aide with [facility] that is there with her from 9 am to 1 pm every day, then her daughter is there with her. She has a walker that is her with her , a w/chair and a BSC.   She is s/p Unsuccessful attempt at perineal PTA. The patient's vessels are diffusely calcified and diseased. The posterior tibial occludes above the ankle and her dorsalis pedis fills by collaterals about the ankle. Her only option is dorsalis pedis access per cath report.  PT has been ordered per verbal from dr. Earna Coder, await recs, NCM spoke with Hassan Rowan, patient's daughter and she states if she needs HH services she would like to work with [facility] because she had them in the past.   Per pt eval rec HHPT, referral made to [place] with [facility] for [place].  Soc will begin 24-48 hrs post dc.                                 Action/Plan: DC home with HHPT with [facility].  Expected Discharge Date:  10/10/17               Expected Discharge Plan:  [place]  In-House Referral:     Discharge planning Services  CM Consult  Post Acute Care Choice:  Home Health Choice offered to:  Adult Children  DME Arranged:    DME Agency:     HH Arranged:  PT HH Agency:  [facility]  Status of Service:  Completed, signed off  If discussed at [employer], dates discussed:    Additional Comments:  Zenon Mayo, RN 10/10/2017, 12:24 PM

## 2017-10-10 NOTE — Evaluation (Signed)
Physical Therapy Evaluation Patient Details Name: Kristin [name] MRN: [id number] DOB: 1931-02-02 Today's Date: 10/10/2017   History of Present Illness  Pt is an 84 y/o female s/p RLE angiography secondary to RLE critical limb ischemia. PMH includes HTN.   Clinical Impression  Pt admitted secondary to problem above with deficits below. Pt with one LOB during gait, with episode of urinary urgency, and required mod A for steadying. Otherwise required supervision for safety with RW. Educated about safety with mobility at home and importance of proper use of RW. Pt very adamant about going home today, and stated her daughters can provide 24/7 assist. Will continue to follow acutely to maximize functional mobility independence and safety.     Follow Up Recommendations Home health PT;Supervision/Assistance - 24 hour    Equipment Recommendations  None recommended by PT    Recommendations for Other Services       Precautions / Restrictions Precautions Precautions: Fall Restrictions Weight Bearing Restrictions: No      Mobility  Bed Mobility               General bed mobility comments: In chair upon entry.   Transfers Overall transfer level: Needs assistance Equipment used: Rolling walker (2 wheeled) Transfers: Sit to/from Stand Sit to Stand: Supervision         General transfer comment: Supervision for safety. Increased time to perform secondary to pain.   Ambulation/Gait Ambulation/Gait assistance: Supervision;Mod assist Ambulation Distance (Feet): 30 Feet Assistive device: Rolling walker (2 wheeled) Gait Pattern/deviations: Step-through pattern;Decreased stride length Gait velocity: Decreased  Gait velocity interpretation: Below normal speed for age/gender General Gait Details: Pt initially not wanting to walk, however, after education from RN and PT, pt agreeable to participate. Very slow guarded gait. Overall steady with use of RW, requiring supervision. Upon  return to room, pt with increased urgency to urinate and left RW behind when turning towards BSC and had LOB requiring mod A for steadying. No other LOB noted. Educated about importance of taking her time and keeping RW with her to prevent LOB. Further distance limited by pain.   Stairs            Wheelchair Mobility    Modified Rankin (Stroke Patients Only)       Balance Overall balance assessment: Needs assistance Sitting-balance support: No upper extremity supported;Feet supported Sitting balance-Leahy Scale: Good     Standing balance support: Bilateral upper extremity supported;During functional activity Standing balance-Leahy Scale: Poor Standing balance comment: Reliant on BUE support.                              Pertinent Vitals/Pain Pain Assessment: 0-10 Pain Score: 7  Pain Location: RLE pain  Pain Descriptors / Indicators: Aching Pain Intervention(s): Limited activity within patient's tolerance;Monitored during session;Repositioned    Home Living Family/patient expects to be discharged to:: Private residence Living Arrangements: Alone Available Help at Discharge: Personal care attendant;Family;Available 24 hours/day(9am-1pm, 5 days/week ) Type of Home: House Home Access: Stairs to enter Entrance Stairs-Rails: None Entrance Stairs-Number of Steps: (unsure how many steps ) Home Layout: One level Home Equipment: Walker - 2 wheels;Shower seat;Bedside commode Additional Comments: Daughters can stay at night and day     Prior Function Level of Independence: Needs assistance   Gait / Transfers Assistance Needed: Used RW for ambulation   ADL's / Homemaking Assistance Needed: Aide assist with bathing and dressing  Hand Dominance   Dominant Hand: Right    Extremity/Trunk Assessment   Upper Extremity Assessment Upper Extremity Assessment: Generalized weakness    Lower Extremity Assessment Lower Extremity Assessment: Generalized  weakness;RLE deficits/detail RLE Deficits / Details: RLE pain limited movement.     Cervical / Trunk Assessment Cervical / Trunk Assessment: Kyphotic  Communication   Communication: No difficulties  Cognition Arousal/Alertness: Awake/alert Behavior During Therapy: WFL for tasks assessed/performed Overall Cognitive Status: No family/caregiver present to determine baseline cognitive functioning                                        General Comments General comments (skin integrity, edema, etc.): Pt adamant about going home today stating, "I don't want to stay another minute in this place." Educated about need for 24/7 assist and pt states her daughters can provide.     Exercises General Exercises - Lower Extremity Ankle Circles/Pumps: AROM;Both;10 reps Quad Sets: AROM;Right;Other reps (comment)(3 reps; limited by pain ) Heel Slides: (verbal education )   Assessment/Plan    PT Assessment Patient needs continued PT services  PT Problem List Decreased strength;Decreased balance;Decreased activity tolerance;Decreased mobility;Pain;Decreased knowledge of use of DME;Decreased knowledge of precautions;Decreased safety awareness       PT Treatment Interventions DME instruction;Gait training;Stair training;Functional mobility training;Therapeutic activities;Therapeutic exercise;Balance training;Neuromuscular re-education;Patient/family education    PT Goals (Current goals can be found in the Care Plan section)  Acute Rehab PT Goals Patient Stated Goal: to go home today  PT Goal Formulation: With patient Time For Goal Achievement: 10/24/17 Potential to Achieve Goals: Good    Frequency Min 3X/week   Barriers to discharge        Co-evaluation               AM-PAC PT "6 Clicks" Daily Activity  Outcome Measure Difficulty turning over in bed (including adjusting bedclothes, sheets and blankets)?: A Little Difficulty moving from lying on back to sitting on the  side of the bed? : A Little Difficulty sitting down on and standing up from a chair with arms (e.g., wheelchair, bedside commode, etc,.)?: A Little Help needed moving to and from a bed to chair (including a wheelchair)?: A Little Help needed walking in hospital room?: A Little Help needed climbing 3-5 steps with a railing? : A Lot 6 Click Score: 17    End of Session   Activity Tolerance: Patient limited by pain Patient left: in chair;with call bell/phone within reach;with chair alarm set Nurse Communication: Mobility status PT Visit Diagnosis: Unsteadiness on feet (R26.81);Muscle weakness (generalized) (M62.81)    Time: 3532-9924 PT Time Calculation (min) (ACUTE ONLY): 34 min   Charges:   PT Evaluation $PT Eval Low Complexity: 1 Low PT Treatments $Gait Training: 8-22 mins   PT G Codes:        Leighton Ruff, PT, DPT  Acute Rehabilitation Services  Pager: [phone]   Rudean Hitt 10/10/2017, 11:34 AM

## 2017-10-10 NOTE — Care Management Note (Addendum)
Case Management Note  Patient Details  Name: Kristin [name] MRN: [id number] Date of Birth: 1930/09/20  Subjective/Objective:   From home alone, she has an aide with [facility] that is there with her from 9 am to 1 pm every day, then her daughter is there with her. She has a walker that is her with her , a w/chair and a BSC.   She is s/p Unsuccessful attempt at perineal PTA. The patient's vessels are diffusely calcified and diseased. The posterior tibial occludes above the ankle and her dorsalis pedis fills by collaterals about the ankle. Her only option is dorsalis pedis access per cath report.  PT has been ordered per verbal from dr. Earna Coder, await recs, NCM spoke with Hassan Rowan, patient's daughter and she states if she needs [place] services she would like to work with [facility] because she had them in the past.                   Action/Plan: NCM will follow for transition care needs.   Expected Discharge Date:  10/10/17               Expected Discharge Plan:  [place]  In-House Referral:     Discharge planning Services  CM Consult  Post Acute Care Choice:    Choice offered to:  Adult Children  DME Arranged:    DME Agency:     HH Arranged:    HH Agency:     Status of Service:  In process, will continue to follow  If discussed at Long Length of Stay Meetings, dates discussed:    Additional Comments:  Zenon Mayo, RN 10/10/2017, 9:42 AM

## 2017-10-11 ENCOUNTER — Telehealth: Payer: Self-pay | Admitting: Cardiovascular Disease

## 2017-10-11 NOTE — Telephone Encounter (Signed)
Closed Encounter  °

## 2017-10-16 ENCOUNTER — Ambulatory Visit (HOSPITAL_COMMUNITY)
Admission: RE | Admit: 2017-10-16 | Discharge: 2017-10-16 | Disposition: A | Discharge status: Home / Self Care | Payer: Medicare Other | Source: Ambulatory Visit | Attending: Cardiology | Admitting: Cardiology

## 2017-10-16 ENCOUNTER — Encounter (HOSPITAL_COMMUNITY): Payer: Self-pay

## 2017-10-16 ENCOUNTER — Encounter (HOSPITAL_BASED_OUTPATIENT_CLINIC_OR_DEPARTMENT_OTHER): Payer: Medicare Other | Attending: Internal Medicine

## 2017-10-16 ENCOUNTER — Other Ambulatory Visit: Payer: Self-pay | Admitting: Hematology

## 2017-10-16 ENCOUNTER — Telehealth: Payer: Self-pay | Admitting: *Deleted

## 2017-10-16 DIAGNOSIS — M810 Age-related osteoporosis without current pathological fracture: Secondary | ICD-10-CM | POA: Diagnosis not present

## 2017-10-16 DIAGNOSIS — D631 Anemia in chronic kidney disease: Secondary | ICD-10-CM | POA: Insufficient documentation

## 2017-10-16 DIAGNOSIS — C9000 Multiple myeloma not having achieved remission: Secondary | ICD-10-CM | POA: Diagnosis not present

## 2017-10-16 DIAGNOSIS — L97312 Non-pressure chronic ulcer of right ankle with fat layer exposed: Secondary | ICD-10-CM | POA: Insufficient documentation

## 2017-10-16 DIAGNOSIS — Z9221 Personal history of antineoplastic chemotherapy: Secondary | ICD-10-CM | POA: Diagnosis not present

## 2017-10-16 DIAGNOSIS — I70229 Atherosclerosis of native arteries of extremities with rest pain, unspecified extremity: Secondary | ICD-10-CM

## 2017-10-16 DIAGNOSIS — N186 End stage renal disease: Secondary | ICD-10-CM | POA: Insufficient documentation

## 2017-10-16 DIAGNOSIS — I120 Hypertensive chronic kidney disease with stage 5 chronic kidney disease or end stage renal disease: Secondary | ICD-10-CM | POA: Insufficient documentation

## 2017-10-16 DIAGNOSIS — I998 Other disorder of circulatory system: Secondary | ICD-10-CM

## 2017-10-16 MED ORDER — MORPHINE SULFATE ER 30 MG PO TBCR: 30.0000 mg | EXTENDED_RELEASE_TABLET | Freq: Two times a day (BID) | ORAL | 0 refills | Status: DC

## 2017-10-16 NOTE — Telephone Encounter (Signed)
Received call from daughter Faythe Ghee requesting refill of Morphone for pt.  Pt uses  [facility] on [address]. [place]     Phone      [phone].

## 2017-10-16 NOTE — Telephone Encounter (Signed)
I refilled for her.   Truitt Merle MD

## 2017-10-17 ENCOUNTER — Ambulatory Visit (INDEPENDENT_AMBULATORY_CARE_PROVIDER_SITE_OTHER): Payer: Medicare Other | Admitting: Cardiovascular Disease

## 2017-10-17 ENCOUNTER — Encounter: Payer: Self-pay | Admitting: Cardiovascular Disease

## 2017-10-17 VITALS — BP 160/82 | HR 112 | Ht <= 58 in | Wt 138.6 lb

## 2017-10-17 DIAGNOSIS — I70229 Atherosclerosis of native arteries of extremities with rest pain, unspecified extremity: Secondary | ICD-10-CM

## 2017-10-17 DIAGNOSIS — I998 Other disorder of circulatory system: Secondary | ICD-10-CM

## 2017-10-17 NOTE — Telephone Encounter (Signed)
Spoke with daughter Katharine Look and informed her of MS refilled by Dr. Burr Medico.  Katharine Look voiced understanding.

## 2017-10-17 NOTE — Progress Notes (Signed)
Kristin [name] was referred to me by Dr. Dellia Nims for critical limb ischemia. She has a right lateral malleolar ulcer and underwent angiography by myself 10/09/17 revealing occluded tibials on the right. I attempted to cross a peroneal unsuccessfully. She did see Dr. Dellia Nims in the office yesterday who felt that her wound was slightly improving. Her only option would be tibial pedal access of the posterior tibial at the level of the ankle on the right. I will continue to see her back on a monthly basis to follow her wound.  Lorretta Harp, M.D., [place], [facility], [place] [address] [place], [place]  [address]  [phone] 10/17/2017 10:17 AM

## 2017-10-17 NOTE — Assessment & Plan Note (Signed)
Kristin [name] was referred to me by Dr. Dellia Nims for critical limb ischemia. She has a right lateral malleolar ulcer and underwent angiography by myself 10/09/17 revealing occluded tibials on the right. I attempted to cross a peroneal unsuccessfully. She did see Dr. Dellia Nims in the office yesterday who felt that her wound was slightly improving. Her only option would be tibial pedal access of the posterior tibial at the level of the ankle on the right. I will continue to see her back on a monthly basis to follow her wound.

## 2017-10-17 NOTE — Patient Instructions (Signed)
Medication Instructions: Your physician recommends that you continue on your current medications as directed. Please refer to the Current Medication list given to you today.   Follow-Up: Your physician recommends that you schedule a follow-up appointment in: 1 month with Dr. Gwenlyn Found.

## 2017-10-20 ENCOUNTER — Other Ambulatory Visit: Payer: Self-pay | Admitting: Hematology

## 2017-10-20 ENCOUNTER — Ambulatory Visit: Payer: Medicare Other | Admitting: Cardiovascular Disease

## 2017-10-23 DIAGNOSIS — L97312 Non-pressure chronic ulcer of right ankle with fat layer exposed: Secondary | ICD-10-CM | POA: Diagnosis not present

## 2017-10-26 NOTE — Progress Notes (Signed)
[name]  [phone] [phone]  Clinic Follow Up Note   Patient Care Team: [name], MD as PCP - General (Family Medicine)   Date of Service: 10/27/2017  CHIEF COMPLAINTS:  Follow up Multiple Myeloma      Multiple myeloma ([place])   10/18/2016 - 10/26/2016 Hospital Admission    Pt was admitted for symptomatic hypercalcemia, AKI, bone marrow biopsy done during hospital stay, and she received Aredia       10/19/2016 Tumor Marker    SPEP showed M-protein 0.4g/dl, serum lambda light chain 5362, ratio 0.00      10/25/2016 Bone Marrow Biopsy     Overall, the marrow is hypercellular with increased lambda-restricted plasma cells (62% aspirate, 90% CD138). There is partial expression of CD20, but no monoclonal B-cell population is detected by flow cytometry.       10/25/2016 Initial Diagnosis    Multiple myeloma ([place])      10/25/2016 Miscellaneous    Cytogenetics and FISH showed loss of 13q, which is intermediate risk for MM       11/10/2016 -  Chemotherapy    Revlimid 10 mg (increased to 60m on cycle 2) daily, 3 weeks on, and one week off, dexamethasone 20 mg weekly, was off from early June to mid July 2018 due to her right hip fracture and hospitalization.  Changed to maintenence Revlimid 156mand stopped dexamethasone starting next cycle, 06/10/17         01/02/2017 Imaging     DG Hip Unilat w or w/o Pelvis IMPRESSION 1. Degenerative changes lumbar spine and both hips. No evidence of fracture or dislocation.  2. Diffuse osteopenia. Multiple tiny lucencies are noted throughout both proximal femurs. These may be secondary to the patient's known multiple myeloma.      01/18/2017 Imaging     CT hip right wo contrast  IMPRESSION: Findings consistent with a pathologic fracture of the right femoral neck. A large lytic lesion in the superior aspect of the lateral femoral head is identified.  Possible nondisplaced fracture of the  right inferior pubic ramus.  Diffuse myeloma.      01/18/2017 - 01/23/2017 Hospital Admission    She present to the ED with complaints of severe right hip pain. she recieved a right hip hemiarthroplasty with Dr. MuPercell Miller     HISTORY OF PRESENTING ILLNESS (10/20/2016):  84 year old African-American female, with past medical history of hypertension, chronic back pain, see Kristin [name], who presents with altered mental status. She was admitted for hypercalcemia. I was called for consult to rule out multiple myeloma.   The patient is joined by family today. They help to report the patient's history. The patient has been lethargic and confused for the past week. She has issues with mobility at this time. She is constipated.   The family reports the patient fell last October, and her PCP suspected she broke her back. The patient received a cortisone injection in January, which was followed by onset of pain and decreased mobility. The patient was subsequently admitted to [name], where the family reports they discovered she was being given someone else's medicine. This led to the patient's confused mental status and difficulty with ambulation. The patient fell again while in [name], and she was brought back to home by her family. She has 24/7 care at home. In the ED, she was found to have calcium more than 15, she was admitted for further management. She received calcitonin, her  calcium came down to 12.7 today. Her SPEP was positive for M protein-0.4 g/dl, possible represent a monoclonal gammopathy. Her 24-hour urine light chains are pending. Her PTH was normal at 20, PTH-rp is pending.   CURRENT THERAPY:  1. RD (Revlimid 10 mg daily, increased to 72m from cycle 3, 3 weeks on, one week off, and weekly dexamethasone 276m, started on 11/10/2016. 2.Change to maintenance Revlimid 10110maily for 3 weeks on and 1 week off, and stopped dexamethasone started on 06/10/17 3. Aredia 51m75mvery 4 weeks, started on 10/24/2016 for hypercalcemia. Switch to Xgeva injections every 4 weeks on 01/02/17  INTERIM HISTORY: Kristin MERTAa 84 y30. female here for follow up accompanied by her daughters. She notes she is doing well. She reports she is doing okay overall. She still has pain in her hip. She takes morphine in the morning, oxycodone in the afternoon and morphine at bedtime. She reports she had a UTI 3 weeks ago and is still recovering.   She has a slow healing wound on her right ankle. Her blood vessels are blocked in the area and that is causing the poor healing. She has been seen by Dr. GhanTamsen Sniderdhe has started her on medications and will hopefully put a stent in.   On review of systems, pt denies any other complaints at this time. Pertinent positives are listed and detailed within the above HPI.   MEDICAL HISTORY:  Past Medical History:  Diagnosis Date  . [facility] by Dr. FengBurr MedicoeslSan Leong" (10/09/2017)  . Chronic kidney disease (CKD), stage [name] (moderate) (HCC)   . Depression    Risperdal "twice/day" (09/23/2016)  . Glaucoma   . Hiatal hernia   . Hypertension   . Intractable back pain    /notes 09/23/2016  . Obesity   . Osteoarthritis    "all over" (10/09/2017)  . Osteoporosis    vertebral fracture in 2012  . Peripheral vascular disease (HCC)[place]. Upper GI bleed 04/2004   secondary to Mallory-Weiss tear/notes 12/28/2010  . Vertigo     SURGICAL HISTORY: Past Surgical History:  Procedure Laterality Date  . ABDOMINAL HYSTERECTOMY  1974   partial  . APPENDECTOMY     as a child/notes 12/28/2010  . BACK SURGERY    . BREAST BIOPSY Left 1999   /notArchie Endo5/2012  . CATARACT EXTRACTION W/ INTRAOCULAR LENS  IMPLANT, BILATERAL Bilateral 2000s  . ESOPHAGOGASTRODUODENOSCOPY  04/2004   /notArchie Endo5/2012  . FIXATION KYPHOPLASTY THORACIC SPINE  05/2011   T10/notes 05/25/2011  . FRACTURE SURGERY    . HIP ARTHROPLASTY Right 01/19/2017   Procedure:  RIGHT HIP HEMIARTHROPLASTY;  Surgeon: MurpRenette Butters;  [facility];  Laterality: Right;  . LAPAROSCOPIC CHOLECYSTECTOMY  10/2007   /notArchie Endo/2012  . LOWER EXTREMITY ANGIOGRAPHY N/A 10/09/2017   Procedure: LOWER EXTREMITY ANGIOGRAPHY;  Surgeon: BerrLorretta Harp;  [facility];  Service: Cardiovascular;  Laterality: N/A;  bilateral  . PERIPHERAL VASCULAR BALLOON ANGIOPLASTY  10/09/2017   Attempted PTA /notArchie Endo5/2019  . PERIPHERAL VASCULAR BALLOON ANGIOPLASTY  10/09/2017   Procedure: PERIPHERAL VASCULAR BALLOON ANGIOPLASTY;  Surgeon: BerrLorretta Harp;  [facility];  Service: Cardiovascular;;  Attempted PTA     SOCIAL HISTORY: Social History   Socioeconomic History  . Marital status: Widowed    Spouse name: Not on file  . Number of children: Not on  file  . Years of education: Not on file  . Highest education level: Not on file  Social Needs  . Financial resource strain: Not on file  . Food insecurity - worry: Not on file  . Food insecurity - inability: Not on file  . Transportation needs - medical: Not on file  . Transportation needs - non-medical: Not on file  Occupational History  . Not on file  Tobacco Use  . Smoking status: Never Smoker  . Smokeless tobacco: Never Used  Substance and Sexual Activity  . Alcohol use: Yes    Comment: 10/09/2017  "glass of wine a few times/year"  . Drug use: No  . Sexual activity: No  Other Topics Concern  . Not on file  Social History Narrative  . Not on file    FAMILY HISTORY: Family History  Problem Relation Age of Onset  . Hypertension Mother   . Osteoporosis Mother   . CVA Mother   . Cancer Neg Hx     ALLERGIES:  is allergic to penicillins; alendronate; and sulfonamide derivatives.  MEDICATIONS:  Current Outpatient Medications  Medication Sig Dispense Refill  . aspirin 81 MG chewable tablet Chew 81 mg by mouth daily.    . Calcium Carbonate (CALCIUM  600 PO) Take 600 mg by mouth 2 (two) times daily.    . feeding supplement, ENSURE ENLIVE, (ENSURE ENLIVE) LIQD Take 237 mLs by mouth 2 (two) times daily between meals. 237 mL 12  . furosemide (LASIX) 20 MG tablet Take 20 mg by mouth daily.    . magnesium oxide (MAG-OX) 400 (241.3 Mg) MG tablet Take 1 tablet (400 mg total) by mouth daily. 7 tablet 0  . meclizine (ANTIVERT) 25 MG tablet TAKE ONE TABLET BY MOUTH THREE TIMES DAILY AS NEEDED (Patient taking differently: Take 25 mg by mouth 3 (three) times daily as needed for dizziness. ) 90 tablet 0  . megestrol (MEGACE) 40 MG tablet Take 1 tablet (40 mg total) by mouth daily.    . mirtazapine (REMERON) 15 MG tablet Take 1 tablet (15 mg total) by mouth at bedtime. 90 tablet 1  . morphine ([name]) 30 MG 12 hr tablet Take 1 tablet (30 mg total) by mouth every 12 (twelve) hours. 60 tablet 0  . ondansetron (ZOFRAN ODT) 4 MG disintegrating tablet Take 1-2 tablets (4-8 mg total) by mouth every 8 (eight) hours as needed for nausea or vomiting. 30 tablet 2  . Oxycodone HCl 10 MG TABS Take 1 tablet (10 mg total) by mouth every 6 (six) hours as needed. FOR MODERATE OR SEVERE PAIN (Patient taking differently: Take 10 mg by mouth every 6 (six) hours as needed (for moderate or severe pain). ) 90 tablet 0  . RESTASIS 0.05 % ophthalmic emulsion Place 1 drop into both eyes daily.     . risperiDONE (RISPERDAL) 0.5 MG tablet Take 0.5 mg by mouth 2 (two) times daily.    . sennosides-docusate sodium (SENOKOT-S) 8.6-50 MG tablet Take 2 tablets by mouth daily.    Marland Kitchen lenalidomide (REVLIMID) 10 MG capsule Take 1 capsule (10 mg total) by mouth daily. Take Revlimid 10 mg by mouth daily for  21 days ;  Off  7 days. 21 capsule 0   No current facility-administered medications for this visit.     REVIEW OF SYSTEMS:  Constitutional: Denies fevers, chills or abnormal night sweats  Eyes: Denies blurriness of vision, double vision or watery eyes Ears, nose, mouth, throat, and face:  Denies mucositis  or sore throat  Respiratory: Denies cough, dyspnea or wheezes Cardiovascular: Denies palpitation, chest discomfort (+) mild lower extremity swelling and right arm swelling Gastrointestinal:  Denies heartburn Skin: Denies abnormal skin rashes (+) wound on right ankle  Lymphatics: Denies new lymphadenopathy or easy bruising  Neurological:Denies numbness, tingling or new weaknesses  Behavioral/Psych: Mood is stable, no new changes  Musculoskeletal: (+) left lower back and improved right side pain, managed with Morphine  All other systems were reviewed with the patient and are negative.  PHYSICAL EXAMINATION: ECOG PERFORMANCE STATUS: 3 Vitals:   10/27/17 1401  BP: (!) 149/68  Pulse: 83  Resp: 14  Temp: 98.3 F (36.8 C)  TempSrc: Oral  SpO2: 99%  Weight: 136 lb 12.8 oz (62.1 kg)     GENERAL:alert, no distress and comfortable, sitting in wheelchair  SKIN: skin color, texture, turgor are normal, no rashes or significant lesions EYES: normal, conjunctiva are pink and non-injected, sclera clear OROPHARYNX:no exudate, no erythema and lips, buccal mucosa, and tongue normal  NECK: supple, thyroid normal size, non-tender, without nodularity LYMPH:  no palpable lymphadenopathy in the cervical, axillary or inguinal LUNGS: clear to auscultation and percussion with normal breathing effort HEART: regular rate & rhythm and no murmurs and no lower extremity edema ABDOMEN:abdomen soft, non-tender and normal bowel sounds Musculoskeletal:no cyanosis of digits and no clubbing  PSYCH: alert & oriented x 3 with fluent speech NEURO: no focal motor/sensory deficits  LABORATORY DATA:  I have reviewed the data as listed CBC Latest Ref Rng & Units 10/27/2017 10/10/2017 10/05/2017  WBC 3.9 - 10.3 K/uL 4.7 5.1 4.9  Hemoglobin 11.6 - 15.9 g/dL 10.2(L) 9.5(L) 10.6(L)  Hematocrit 34.8 - 46.6 % 30.9(L) 28.5(L) 32.6(L)  Platelets 145 - 400 K/uL 166 198 228   CMP Latest Ref Rng & Units 10/27/2017  10/10/2017 10/05/2017  Glucose 70 - 140 mg/dL 110 91 102(H)  BUN 7 - 26 mg/dL 15 11 15   Creatinine 0.60 - 1.10 mg/dL 1.09 1.02(H) 1.12(H)  Sodium 136 - 145 mmol/L 140 140 142  Potassium 3.5 - 5.1 mmol/L 3.7 3.3(L) 3.6  Chloride 98 - 109 mmol/L 105 106 103  CO2 22 - 29 mmol/L 27 24 20   Calcium 8.4 - 10.4 mg/dL 8.7 7.9(L) 8.7  Total Protein 6.4 - 8.3 g/dL 6.8 - -  Total Bilirubin 0.2 - 1.2 mg/dL 0.5 - -  Alkaline Phos 40 - 150 U/L 62 - -  AST 5 - 34 U/L 12 - -  ALT 0 - 55 U/L 7 - -    MM LAB: M-protein (g/dl) 10/19/2016: 0.4 11/01/2016: Not Det 12/05/16: Not Det 01/02/17: Not det 02/27/17: Not det 03/24/17: not det.  04/24/17: not det.  05/19/17: not det. 06/16/17:: not det 07/21/17: not det 08/18/17: not det 09/15/17: not det 10/27/17: PENDING  Serum kappa, lamda light chains and ratio (mg/L) 10/21/2016: 14.8, 5362, 0.00 11/01/2016: 1.49, 10.88, 0.14 12/05/2016: 2.16, 19.31, 0.11 01/02/17: 3.01, 10.90, 0.28 02/27/17: 1.49, 20.64, 0.07 03/24/17: 2.19, 3.90, 0.56 04/24/17: 2.58, 4.02, 0.64 05/19/17: 2.22, 3.55, 0.63 06/16/17:: 4.18, 5.55, 0.75 07/21/17: 3.86, 5.10, 0.76 08/18/17: 2.90, 3.58, 0.81 09/15/17: 5.53. 5.47, 1.01 10/27/17: PENDING  24 hour urine (total protein, free lambda, ration) 10/21/2016: 1463, 1570, 0.06   PATHOLOGY  Diagnosis 01/19/2017 Femoral head, fracture, Right - FRACTURE, SEE COMMENT. Microscopic Comment There are scattered plasma cells seen with CD138, predominately in the area of fracture. Light chain in situ hybridization is non-contributory, likely due to heavy decalcification. Overt plasma cell neoplasm is not  appreciated.  Interpretation 10/25/2016 Bone Marrow Flow Cytometry - NO MONOCLONAL B-CELL OR PHENOTYPICALLY ABERRANT T-CELL POPULATION IDENTIFIED.  Diagnosis 10/25/2016 Bone Marrow, Aspirate,Biopsy, and Clot - PLASMA CELL MYELOMA. - SEE COMMENT. PERIPHERAL BLOOD: - NORMOCYTIC ANEMIA. Diagnosis Note Overall, the marrow is hypercellular with increased  lambda-restricted plasma cells (62% aspirate, 90% CD138). There is partial expression of CD20, but no monoclonal B-cell population is detected by flow cytometry.     RADIOGRAPHIC STUDIES: I have personally reviewed the radiological images as listed and agreed with the findings in the report. Dg Ankle Complete Right  Result Date: 10/03/2017 CLINICAL DATA:  Right ankle pain.  Ulcer. EXAM: RIGHT ANKLE - COMPLETE 3+ VIEW COMPARISON:  None. FINDINGS: Soft tissue ulceration with gas overlies the lateral malleolus. No underlying bone erosion identified. No fractures or dislocations. Diffuse soft tissue edema identified. Vascular calcifications are noted. IMPRESSION: 1. Lateral soft tissue ulceration. No focal bone abnormality identified. 2. Diffuse soft tissue swelling. Electronically Signed   By: Kerby Moors M.D.   On: 10/03/2017 08:17     DG Hip Unilateral 01/02/17 IMPRESSION: 1. Degenerative changes lumbar spine and both hips. No evidence of fracture or dislocation. 2. Diffuse osteopenia. Multiple tiny lucencies are noted throughout both proximal femurs. These may be secondary to the patient's known multiple myeloma.   ASSESSMENT & PLAN:  84 y.o.woman with HTN and CKD, presented with hypercalcemia, worsening anemia since 06/2016, s/p multiple fall, multiple hospitalization recently for back pain , CT scan showedT2 vertebral body compression fracture with mild height loss.  1. 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 -We previously discussed treatment options.  due to advanced  age, multiple comorbidities, very limited performance status, I do not believe she is a candidate for bone marrow transplant -she was started in induction RD. Start on dexamethasone 44m weekly and Revlimid 162mdaily; 3 weeks on, 1 week off, at adjusted for her renal function. She has been tolerating well, dose increased to 1574mrom cycle 3 -due to her Intermediated risk disease, she may need Velcade. Due to her advanced age and comorbidities, I am concerned about her tolerance to Velcade. I will try RD for a few cycles, if she does not have good response, I will add Velcade.  -The goal of therapy is palliative, for disease control, improve her quality of life and prolong life. - Repeat multiple myeloma panel every 8 weeks  - she has underwent right hip surgery with Dr. MurPercell Miller June 2018, she was off Revlimid for about 6 weeks. - she has been tolerating Revlimid and dexamethasone well -Her M-protein is negative and she is responding well to treatment. Will continue Revlimid.  -Given her excellent response to treatment, I have changed her to maintenance therapy. -she is currently on maintenence Revlimid 10 mg daily for 3 weeks, and one-week off. She will continue this until disease progresses.  She understands her multiple myeloma is not curable and the goal of therapy is disease control.  -Labs reviewed, potassium normalized, Hg 10.2. Her M-protein panel is still pending. She will continue Revlimid Maintenance. She is clinically doing well.  -If her response remains good and her labs  stable I will space out her labs and visits. She has difficult vein access -she is clinically stable, except mild worsening back and hip pain, and poor-healing wound on her right ankle. She will f/u with her orthopedic surgeon and wound specialist  -Her M protein has been undetectable, no disease progression so far. -I reviewed her risk for infection again today   -Continue monthly Xgeva for a total of 2 years  -F/u  in 2 months    2. Anemia  -secondary to MM and Revlimid. -She received blood transfusion in hospital -will monitor CBC closely -consider blood transfusion if Hb<8.0 or symptomatic anemia with Hb 8-9 -Her anemia has slightly improved since he came off Revlimid, hemoglobin 10.3 (02/27/17) -Anemia is 9.4 on (04/24/17), does not need blood  transfusion. We will continue to monitor -HG slightly improved but overall stable, Hg at 9.8 (05/19/17) -Hg still improving, 10.4 (06/16/17)  -Stable   3. History of hypercalcemia/hypocalcemia  -She has received one dose of Aredia, hypercalcemia has nearly resolved now. We'll continue every 4 weeks for multiple myeloma, or more frequent if her hypercalcemia gets worse. -Her reached normal limits previously  - I previously encouraged her to take vitD, 800 unit daily  -Her Calcium was 8.0 (04/24/17) so I suggested she takes some Vitamin D with Calcium 800 mg once a day, I'll increase her calcium to twice daily -calcium stable at 8.1 (05/19/17), no Xgeva was given that day -Calcium dropped to 7.4 on (06/16/17), No injection was given that day. -I strongly encouraged her to restart calcium with vitamin D BID and increase dairy intake previously. We'll resume Xgeva injection next month if her calcium level improves -Her Ca is 8.4 on (09/15/17) She can get Xgeva, but we will see her back in 6 weeks instead of 4 to allow her Ca to get higher. She is compliant with her CA supplement and takes it twice a day.  -Ca 8.7 today (10/27/17) continue with Xgeva today   4. Chronic CKD, stage II -much improved, EGFR 50-60 -Avoid nephrotoxic medicine or contrast   5. Severe back pain and right hip pain from pathologic fracture -T2 compression fracture in the right hip is logical fracture secondary to her multiple myeloma -She will continue [name] every 12 hours and oxycodone as needed -we discussed taking laxative due to increased oxycodone prescription -We discussed using  walker and avoid weight bearing on her right hip -Will give referral to orthopedic surgery for further evaluation  -She is fine to take tylenol, but she should avoid advil due to her MM.  -Pain is controlled with Morphine and oxycodone BID, refilled on (09/15/17)  6. HTN -controlled with medication -managed by PCP  7. Osteoporosis and fractures -Takes Boniva, which has been switched to Aredia  -Due to her poor IV access, I'll switch Aredia to Xgeva every 4 weeks -Started Xgeva 01/02/17, and continue every 4 weeks, tolerating well. She will continue for the next 2 years  8. Goal of care discussion  -We previously discussed the incurable nature of her MM, and the overall poor prognosis, especially if she does not have good response to chemotherapy or progress on chemo -The patient understands the goal of care is palliative. -I recommend DNR/DNI, she will think about it   9. Anorexia  - I have previously encouraged her to eat snacks in between her meals -I previously recommended her to try mirtazapine 7.49m HS, may increase dose in future if needed  -Remeron is working well and  her appetite has improved lately.  -Increased mirtazapine to 64m (04/24/17), she is also on megace  -Appetite has improved.   11. Right ankle wound, right LE PVD -She has had a wound in the right ankle for 2-3 months, follow-up of his wound clinic -She recently found to have severe peripheral vascular disease in the right lower extremity, revascularization with stent was not successful.  She will follow-up with her cardiologist and vascular surgeon for medical treatment    Plan -Lab and injection in 4 weeks  -Lab and f/u in 2 months  -Continue Revlimid   All questions were answered. The patient knows to call the clinic with any problems, questions or concerns.  I spent 20 minutes counseling the patient face to face. The total time spent in the appointment was 25 minutes and more than 50% was on  counseling.  This document serves as a record of services personally performed by YTruitt Merle MD. It was created on her behalf by DTheresia Bough a trained medical scribe. The creation of this record is based on the scribe's personal observations and the provider's statements to them.   I have reviewed the above documentation for accuracy and completeness, and I agree with the above.    YTruitt Merle MD 10/27/2017

## 2017-10-27 ENCOUNTER — Other Ambulatory Visit: Payer: Self-pay | Admitting: *Deleted

## 2017-10-27 ENCOUNTER — Encounter: Payer: Self-pay | Admitting: Hematology

## 2017-10-27 ENCOUNTER — Inpatient Hospital Stay: Payer: Medicare Other

## 2017-10-27 ENCOUNTER — Telehealth: Payer: Self-pay | Admitting: Hematology

## 2017-10-27 ENCOUNTER — Inpatient Hospital Stay (HOSPITAL_BASED_OUTPATIENT_CLINIC_OR_DEPARTMENT_OTHER): Payer: Medicare Other | Admitting: Hematology

## 2017-10-27 ENCOUNTER — Inpatient Hospital Stay: Payer: Medicare Other | Attending: Hematology

## 2017-10-27 VITALS — BP 149/68 | HR 83 | Temp 98.3°F | Resp 14 | Wt 136.8 lb

## 2017-10-27 DIAGNOSIS — K5900 Constipation, unspecified: Secondary | ICD-10-CM | POA: Insufficient documentation

## 2017-10-27 DIAGNOSIS — M810 Age-related osteoporosis without current pathological fracture: Secondary | ICD-10-CM | POA: Diagnosis not present

## 2017-10-27 DIAGNOSIS — C9000 Multiple myeloma not having achieved remission: Secondary | ICD-10-CM

## 2017-10-27 DIAGNOSIS — K449 Diaphragmatic hernia without obstruction or gangrene: Secondary | ICD-10-CM

## 2017-10-27 DIAGNOSIS — D649 Anemia, unspecified: Secondary | ICD-10-CM

## 2017-10-27 DIAGNOSIS — I10 Essential (primary) hypertension: Secondary | ICD-10-CM

## 2017-10-27 DIAGNOSIS — M1990 Unspecified osteoarthritis, unspecified site: Secondary | ICD-10-CM | POA: Insufficient documentation

## 2017-10-27 DIAGNOSIS — Z79899 Other long term (current) drug therapy: Secondary | ICD-10-CM

## 2017-10-27 DIAGNOSIS — E669 Obesity, unspecified: Secondary | ICD-10-CM | POA: Diagnosis not present

## 2017-10-27 DIAGNOSIS — N183 Chronic kidney disease, stage 3 (moderate): Secondary | ICD-10-CM | POA: Diagnosis not present

## 2017-10-27 DIAGNOSIS — D630 Anemia in neoplastic disease: Secondary | ICD-10-CM

## 2017-10-27 DIAGNOSIS — I129 Hypertensive chronic kidney disease with stage 1 through stage 4 chronic kidney disease, or unspecified chronic kidney disease: Secondary | ICD-10-CM

## 2017-10-27 DIAGNOSIS — F329 Major depressive disorder, single episode, unspecified: Secondary | ICD-10-CM

## 2017-10-27 DIAGNOSIS — I739 Peripheral vascular disease, unspecified: Secondary | ICD-10-CM | POA: Insufficient documentation

## 2017-10-27 DIAGNOSIS — M549 Dorsalgia, unspecified: Secondary | ICD-10-CM | POA: Insufficient documentation

## 2017-10-27 LAB — COMPREHENSIVE METABOLIC PANEL
ALBUMIN: 3.1 g/dL — AB (ref 3.5–5.0)
ALT: 7 U/L (ref 0–55)
AST: 12 U/L (ref 5–34)
Alkaline Phosphatase: 62 U/L (ref 40–150)
Anion gap: 8 (ref 3–11)
BUN: 15 mg/dL (ref 7–26)
CO2: 27 mmol/L (ref 22–29)
Calcium: 8.7 mg/dL (ref 8.4–10.4)
Chloride: 105 mmol/L (ref 98–109)
Creatinine, Ser: 1.09 mg/dL (ref 0.60–1.10)
GFR calc Af Amer: 52 mL/min — ABNORMAL LOW (ref >60)
GFR calc non Af Amer: 45 mL/min — ABNORMAL LOW (ref >60)
GLUCOSE: 110 mg/dL (ref 70–140)
POTASSIUM: 3.7 mmol/L (ref 3.5–5.1)
SODIUM: 140 mmol/L (ref 136–145)
Total Bilirubin: 0.5 mg/dL (ref 0.2–1.2)
Total Protein: 6.8 g/dL (ref 6.4–8.3)

## 2017-10-27 LAB — CBC WITH DIFFERENTIAL/PLATELET
Basophils Absolute: 0 10*3/uL (ref 0.0–0.1)
Basophils Relative: 0 %
EOS PCT: 5 %
Eosinophils Absolute: 0.2 10*3/uL (ref 0.0–0.5)
HEMATOCRIT: 30.9 % — AB (ref 34.8–46.6)
Hemoglobin: 10.2 g/dL — ABNORMAL LOW (ref 11.6–15.9)
LYMPHS ABS: 1.5 10*3/uL (ref 0.9–3.3)
LYMPHS PCT: 31 %
MCH: 32.3 pg (ref 25.1–34.0)
MCHC: 33 g/dL (ref 31.5–36.0)
MCV: 97.8 fL (ref 79.5–101.0)
MONO ABS: 0.5 10*3/uL (ref 0.1–0.9)
MONOS PCT: 10 %
NEUTROS ABS: 2.6 10*3/uL (ref 1.5–6.5)
Neutrophils Relative %: 54 %
Platelets: 166 10*3/uL (ref 145–400)
RBC: 3.16 MIL/uL — ABNORMAL LOW (ref 3.70–5.45)
RDW: 16.6 % — AB (ref 11.2–14.5)
WBC: 4.7 10*3/uL (ref 3.9–10.3)

## 2017-10-27 MED ORDER — LENALIDOMIDE 10 MG PO CAPS
10.0000 mg | ORAL_CAPSULE | Freq: Every day | ORAL | 0 refills | Status: DC
Start: 2017-10-27 — End: 2017-11-24

## 2017-10-27 MED ORDER — DENOSUMAB 120 MG/1.7ML ~~LOC~~ SOLN
120.0000 mg | Freq: Once | SUBCUTANEOUS | Status: AC
  Administered 2017-10-27: 120 mg via SUBCUTANEOUS

## 2017-10-27 NOTE — Patient Instructions (Signed)

## 2017-10-27 NOTE — Telephone Encounter (Signed)
Gave patient AVs and calendar of upcoming April and may appointments.

## 2017-10-30 LAB — KAPPA/LAMBDA LIGHT CHAINS
Kappa free light chain: 78.6 mg/L — ABNORMAL HIGH (ref 3.3–19.4)
Kappa, lambda light chain ratio: 0.92 (ref 0.26–1.65)
LAMDA FREE LIGHT CHAINS: 85.8 mg/L — AB (ref 5.7–26.3)

## 2017-10-31 ENCOUNTER — Ambulatory Visit: Payer: Medicare Other | Admitting: Cardiovascular Disease

## 2017-10-31 LAB — MULTIPLE MYELOMA PANEL, SERUM
ALBUMIN SERPL ELPH-MCNC: 3.2 g/dL (ref 2.9–4.4)
ALPHA 1: 0.3 g/dL (ref 0.0–0.4)
Albumin/Glob SerPl: 1 (ref 0.7–1.7)
Alpha2 Glob SerPl Elph-Mcnc: 0.7 g/dL (ref 0.4–1.0)
B-Globulin SerPl Elph-Mcnc: 1 g/dL (ref 0.7–1.3)
Gamma Glob SerPl Elph-Mcnc: 1.3 g/dL (ref 0.4–1.8)
Globulin, Total: 3.3 g/dL (ref 2.2–3.9)
IGA: 446 mg/dL — AB (ref 64–422)
IgG (Immunoglobin G), Serum: 1393 mg/dL (ref 700–1600)
IgM (Immunoglobulin M), Srm: 25 mg/dL — ABNORMAL LOW (ref 26–217)
TOTAL PROTEIN ELP: 6.5 g/dL (ref 6.0–8.5)

## 2017-11-02 ENCOUNTER — Other Ambulatory Visit (HOSPITAL_COMMUNITY)
Admission: RE | Admit: 2017-11-02 | Discharge: 2017-11-02 | Disposition: A | Discharge status: Home / Self Care | Payer: Medicare Other | Source: Other Acute Inpatient Hospital | Attending: Physician Assistant | Admitting: Physician Assistant

## 2017-11-02 DIAGNOSIS — L97312 Non-pressure chronic ulcer of right ankle with fat layer exposed: Secondary | ICD-10-CM | POA: Diagnosis not present

## 2017-11-02 DIAGNOSIS — L97311 Non-pressure chronic ulcer of right ankle limited to breakdown of skin: Secondary | ICD-10-CM | POA: Insufficient documentation

## 2017-11-05 LAB — AEROBIC CULTURE W GRAM STAIN (SUPERFICIAL SPECIMEN)

## 2017-11-05 LAB — AEROBIC CULTURE  (SUPERFICIAL SPECIMEN)

## 2017-11-06 DIAGNOSIS — L97312 Non-pressure chronic ulcer of right ankle with fat layer exposed: Secondary | ICD-10-CM | POA: Diagnosis not present

## 2017-11-13 ENCOUNTER — Encounter (HOSPITAL_BASED_OUTPATIENT_CLINIC_OR_DEPARTMENT_OTHER): Payer: Medicare Other

## 2017-11-14 ENCOUNTER — Other Ambulatory Visit: Payer: Self-pay | Admitting: Cardiology

## 2017-11-14 DIAGNOSIS — I872 Venous insufficiency (chronic) (peripheral): Secondary | ICD-10-CM

## 2017-11-16 ENCOUNTER — Other Ambulatory Visit (HOSPITAL_COMMUNITY)
Admission: RE | Admit: 2017-11-16 | Discharge: 2017-11-16 | Disposition: A | Discharge status: Home / Self Care | Payer: Medicare Other | Source: Other Acute Inpatient Hospital | Attending: Internal Medicine | Admitting: Internal Medicine

## 2017-11-16 ENCOUNTER — Encounter (HOSPITAL_BASED_OUTPATIENT_CLINIC_OR_DEPARTMENT_OTHER): Payer: Medicare Other | Attending: Internal Medicine

## 2017-11-16 DIAGNOSIS — Z79899 Other long term (current) drug therapy: Secondary | ICD-10-CM | POA: Diagnosis not present

## 2017-11-16 DIAGNOSIS — L97312 Non-pressure chronic ulcer of right ankle with fat layer exposed: Secondary | ICD-10-CM | POA: Insufficient documentation

## 2017-11-16 DIAGNOSIS — L97823 Non-pressure chronic ulcer of other part of left lower leg with necrosis of muscle: Secondary | ICD-10-CM | POA: Insufficient documentation

## 2017-11-16 DIAGNOSIS — L97329 Non-pressure chronic ulcer of left ankle with unspecified severity: Secondary | ICD-10-CM | POA: Diagnosis present

## 2017-11-16 DIAGNOSIS — Z86718 Personal history of other venous thrombosis and embolism: Secondary | ICD-10-CM | POA: Diagnosis not present

## 2017-11-16 DIAGNOSIS — D630 Anemia in neoplastic disease: Secondary | ICD-10-CM | POA: Insufficient documentation

## 2017-11-16 DIAGNOSIS — I7090 Unspecified atherosclerosis: Secondary | ICD-10-CM | POA: Diagnosis not present

## 2017-11-16 DIAGNOSIS — C9000 Multiple myeloma not having achieved remission: Secondary | ICD-10-CM | POA: Diagnosis not present

## 2017-11-16 DIAGNOSIS — I120 Hypertensive chronic kidney disease with stage 5 chronic kidney disease or end stage renal disease: Secondary | ICD-10-CM | POA: Diagnosis not present

## 2017-11-16 DIAGNOSIS — N186 End stage renal disease: Secondary | ICD-10-CM | POA: Insufficient documentation

## 2017-11-16 DIAGNOSIS — D631 Anemia in chronic kidney disease: Secondary | ICD-10-CM | POA: Diagnosis not present

## 2017-11-16 DIAGNOSIS — A4901 Methicillin susceptible Staphylococcus aureus infection, unspecified site: Secondary | ICD-10-CM | POA: Diagnosis not present

## 2017-11-17 ENCOUNTER — Ambulatory Visit: Payer: Medicare Other | Admitting: Cardiovascular Disease

## 2017-11-18 ENCOUNTER — Other Ambulatory Visit: Payer: Self-pay | Admitting: Hematology

## 2017-11-18 DIAGNOSIS — C9000 Multiple myeloma not having achieved remission: Secondary | ICD-10-CM

## 2017-11-19 LAB — AEROBIC CULTURE W GRAM STAIN (SUPERFICIAL SPECIMEN)

## 2017-11-20 ENCOUNTER — Encounter: Payer: Self-pay | Admitting: Hematology

## 2017-11-20 ENCOUNTER — Other Ambulatory Visit: Payer: Self-pay

## 2017-11-20 DIAGNOSIS — C9000 Multiple myeloma not having achieved remission: Secondary | ICD-10-CM

## 2017-11-20 MED ORDER — MORPHINE SULFATE ER 30 MG PO TBCR: 30.0000 mg | EXTENDED_RELEASE_TABLET | Freq: Two times a day (BID) | ORAL | 0 refills | Status: DC

## 2017-11-20 NOTE — H&P (Signed)
Kristin [name] 11-26-2017 8:45 AM Location: [facility] Patient #: [id number] DOB: August 17, 1930 Widowed / Language: Cleophus Molt / Race: Black or African American Female   History of Present Illness Gwinda Maine FNP-C; 11/11/2017 5:57 PM) Patient words: Last OV 10/27/2017; 2 week f/u for pad; Pt c/o L ankle is starting to get a wound.  The patient is a 84 year old female who presents for a Follow-up for Peripheral vascular disease. Ms. Tawana Pasch is a elderly African-American female with hypertension, hyperlipidemia, chronic shortness breath and palpitations that is stable, recently diagnosed multiple myeloma being followed by Oncology. She also has known vascular disease with nonhealing right malleoi ulcer managed by wound therapy and previous unsuccessful angioplasty. She had recently presented to me for 2nd opinion.  She presents for 2 week follow up. She was started on Xarelto 2.80m and Irbesartan at her last OV. She continues to have severe pain in her right foot and has now also developed a wound on her left lower extremity. No falls or wound discharge, she is not a diabetic, she does not smoke tobacco.  She was fairly active until she had a fall and broke her right hip and had surgery for the same in March 2018.   Problem List/Past Medical (Frances FurbishJohnson; [id number]-Apr-20198:39 AM) Essential hypertension, benign (I10)  EKG 10/27/2017: Normal sinus rhythm at rate of 84 bpm, left atrial abnormality, left axis deviation, left anterior fascicular block. Poor progression, cannot exclude anteroseptal infarct old. LVH. Other dyspnea and respiratory abnormality (R06.09, R09.89)  Echocardiogram 08/05/2014: Left ventricle cavity is normal in size. Moderate concentric hypertrophy of the left ventricle. Borderline decrease in global wall motion. Doppler evidence of grade I (impaired) diastolic dysfunction. Diastolic dysfunction do not suggest elevated LA/LV endiastolic pressure. Left ventricle  regional wall motion findings: No wall motion abnormalities. Visual EF is 50-55%. Calculated EF 67%. Mild calcification of the aortic valve annulus. Mild aortic valve leaflet calcification. No aortic valve regurgitation noted. Mild mitral regurgitation. Mild calcification of the mitral valve annulus. Mild to moderate tricuspid regurgitation. Mild pulmonary hypertension. IVC is dilated with blunted respiratory response. Mild pulmonic regurgitation. Overall no significant change since 03/19/2012 and mild to moderate PHT was noted previously also. Lexiscan stress 03/28/12: Mild gut uptake artifact. No ischemia. Low risk stress test. Atherosclerosis of native artery of right lower extremity with ulceration of ankle ((H47.654  Peripheral arteriogram 10/09/2017: Two-vessel runoff left lower extremity with occluded posterior tibial, right lower extremity 0 vessel runoff. Unsuccessful attempt at PTCA peroneal artery. Laboratory examination (Z01.89)  Labs 11/03/2017: Serum cholesterol 105, triglycerides 55, HDL 44, LDL 50. Serum glucose 95 mg. BUN 18, creatinine 1.31, eGFR 37/43 mL, potassium 4.2. Labs 10/10/2017: BUN 11, creatinine 1.02, eGFR 56 mL, potassium 3.3. HB 9.5/HCT 28 15, normal indicis, platelets 198. Multiple myeloma, remission status unspecified (C90.00)  Feb 2018: [place] Dr. FBurr Medico MD Pure hypercholesterolemia (272.0)  Chronic kidney disease, stage 3 (N18.3)   Allergies (Frances FurbishJohnson; [id number]:39 AM) SulfADIAZINE *SULFONAMIDES*  Swelling. facial swelling Penicillin V *PENICILLINS*  Hives.  Family History (Cheri Kearns 3April 14, 20198:39 AM) Mother  Deceased. at age 4439from a stroke Father  Deceased. at age 82- no heart conditions. Siblings  Sister - 761years older Brother 129- 172years older BBrother 25 579years younger 2 Brothers Deceased  Social History (Cheri Kearns 304/14/20198:39 AM) Current tobacco use  Never smoker. Alcohol Use  Occasional alcohol use. Rarely drinks  alcohol Marital status  Widowed. Living Situation  Lives alone. in apartment Number of Children  5.  Past Surgical History Cheri Kearns; 11/11/17 8:39 AM) Hysterectomy 1975  Hip Fracture & Surgery - Right [11/2016]: Cataract surgery on both eyes in 2009.  back surgery [2011]:  Medication History Frances Furbish Johnson; 2017-11-11 8:49 AM) Xarelto (2.5MG Tablet, 1 (one) Tablet Oral two times daily with food, Taken starting 10/27/2017) Active. Atorvastatin Calcium (10MG Tablet, 1 (one) Tablet Oral daily, Taken starting 10/27/2017) Active. Irbesartan (75MG Tablet, 1 (one) Tablet Oral every evening after dinner, Taken starting 10/27/2017) Active. Aspirin (81MG Tablet, 1 Oral daily) Active. RisperiDONE (0.5MG Tablet, 1 Oral daily) Active. Restasis (0.05% Emulsion, 1drop in each eye Ophthalmic two times daily) Active. Meclizine HCl (25MG Tablet, 1 Oral three times daily as needed.) Active. Calcium 600 mg (1 daily) Active. Furosemide (20MG Tablet, 1 Oral daily) Active. Lenalidomide (10MG Capsule, 1 for 21 days then off 7 days Oral daily) Active. Megestrol Acetate (40MG Tablet, 1 Oral daily) Active. Morphine Sulfate (30MG Tablet, 1 every 12 hours Oral daily) Active. Ondansetron (4MG Tablet Disint, 1-2 every 8 hrs Oral as needed) Active. OxyCODONE HCl (10MG Tablet, 1 every 6 hrs Oral as needed for pain) Active. Senokot S (8.6-50MG Tablet, 2 Oral daily) Active. Santyl (250UNIT/GM Ointment, External daily) Active. Linezolid (600MG Tablet, 1 Oral two times daily) Active. (has not started yet) Medications Reconciled (verbally)  Diagnostic Studies History Frances Furbish Johnson; 11/11/17 8:39 AM) Colonoscopy 2009 (normal)  Lexiscan stress 03/28/12: Mild gut uptake artifact. No ischemia. Low risk stress test.  Echocardiogram [08/05/2014]: Left ventricle cavity is normal in size. Moderate concentric hypertrophy of the left ventricle. Borderline decrease in global wall motion.  Doppler evidence of grade I (impaired) diastolic dysfunction. Diastolic dysfunction do not suggest elevated LA/LV endiastolic pressure. Left ventricle regional wall motion findings: No wall motion abnormalities. Visual EF is 50-55%. Calculated EF 67%. Mild calcification of the aortic valve annulus. Mild aortic valve leaflet calcification. No aortic valve regurgitation noted. Mild mitral regurgitation. Mild calcification of the mitral valve annulus. Mild to moderate tricuspid regurgitation. Mild pulmonary hypertension. IVC is dilated with blunted respiratory response. Mild pulmonic regurgitation. Overall no significant change since 03/19/2012. Peripheral arteriogram:  10/09/2017: Two-vessel runoff left lower extremity with occluded posterior tibial, right lower extremity 0 vessel runoff. Unsuccessful attempt at PTCA peroneal artery. Doppler Ultrasound  Lower extremity arterial duplex 10/02/2017: Right ABI 0.77, left ABI 0.99. Technically difficult study. Disease below knee bilateral. Holter [04/30/2015]:  Other Problems Frances Furbish Johnson; 11/11/17 8:39 AM) Broken bones in back 2012.     Review of Systems Gwinda Maine FNP-C; 11/11/17 12:59 PM) General Not Present- Fatigue, Fever and Night Sweats. Respiratory Not Present- Dyspnea. Cardiovascular Present- Claudications (right leg worse than left) and Swelling of Extremities (right foot swelling). Not Present- Chest Pain, Fainting, Orthopnea and Palpitations. Gastrointestinal Not Present- Abdominal Pain, Constipation, Diarrhea, Nausea and Vomiting. Musculoskeletal Present- Physical Disability (since right hip replacement in March 2018). Not Present- Joint Swelling. Neurological Not Present- Dizziness, Focal Neurological Symptoms, Headaches and Syncope. Hematology Not Present- Blood Clots, Easy Bruising and Nose Bleed. All other systems negative  Vitals Frances Furbish Johnson; 11-11-17 8:48 AM) 11-11-17 8:41 AM Weight: 136.19 lb Height:  59in Body Surface Area: 1.57 m Body Mass Index: 27.51 kg/m  Pulse: 85 (Regular)  P.OX: 92% (Room air) BP: 130/60 (Sitting, Left Arm, Standard)       Physical Exam Gwinda Maine FNP-C; November 11, 2017 9:09 AM) General Mental Status-Alert. General Appearance-Cooperative, Appears stated age, Not in acute distress. Orientation-Oriented X3. Build & Nutrition-Well nourished and  Moderately built.  Head and Neck Thyroid Gland Characteristics - no palpable nodules, no palpable enlargement.  Chest and Lung Exam Chest and lung exam reveals -quiet, even and easy respiratory effort with no use of accessory muscles. Palpation Tender - No chest wall tenderness.  Cardiovascular Cardiovascular examination reveals -normal heart sounds, regular rate and rhythm with no murmurs and carotid auscultation reveals no bruits. Inspection Jugular vein - Right - No Distention.  Abdomen Palpation/Percussion Normal exam - Non Tender and No hepatosplenomegaly. Auscultation Normal exam - Bowel sounds normal.  Peripheral Vascular Lower Extremity Inspection - Left - Erythematous and Ulcerated(new non-draining, closed ulceration to left lower lateral extremity). Right - Ulcerated(right lateral malleolar area, no bone exposure). Palpation - Temperature - Bilateral - Normal. Edema - Left - No edema. Right - 2+ Pitting edema. Femoral pulse - Bilateral - Normal. Popliteal pulse - Bilateral - Normal. Dorsalis pedis pulse - Left - Normal. Right - Absent. Posterior tibial pulse - Bilateral - Absent. Abdomen-No prominent abdominal aortic pulsation.  Neurologic Motor-Grossly intact without any focal deficits.  Musculoskeletal - Did not examine.    Assessment & Plan Gwinda Maine FNP-C; 11/11/2017 5:59 PM) Atherosclerosis of native artery of right lower extremity with ulceration of ankle (N36.144) Story: Peripheral arteriogram 10/09/2017: Two-vessel runoff left lower extremity with  occluded posterior tibial, right lower extremity 0 vessel runoff. Unsuccessful attempt at PTCA peroneal artery. Impression: Lower extremity arterial duplex 10/02/2017: Right ABI 0.77, left ABI 0.99. Technically difficult study. Disease below knee bilateral. Current Plans METABOLIC PANEL, BASIC (31540) CBC & PLATELETS (AUTO) (08676) PT (PROTHROMBIN TIME) (19509) Essential hypertension, benign (I10) Story: EKG 10/27/2017: Normal sinus rhythm at rate of 84 bpm, left atrial abnormality, left axis deviation, left anterior fascicular block. Poor progression, cannot exclude anteroseptal infarct old. LVH. Multiple myeloma, remission status unspecified (C90.00) Story: Feb 2018: Follows Dr. Burr Medico, MD Chronic kidney disease, stage 3 (N18.3) Venous stasis ulcer (I83.009) Story: left lateral lower extremity Laboratory examination (T26.71) Story: Labs 11/03/2017: Serum cholesterol 105, triglycerides 55, HDL 44, LDL 50. Serum glucose 95 mg. BUN 18, creatinine 1.31, eGFR 37/43 mL, potassium 4.2.  Labs 10/10/2017: BUN 11, creatinine 1.02, eGFR 56 mL, potassium 3.3. HB 9.5/HCT 28 15, normal indicis, platelets 198.  Note:-  Recommendations:  Ms. Kristin [name] is a elderly African-American female with hypertension, hyperlipidemia, chronic shortness breath and palpitations that is stable, recently diagnosed multiple myeloma being followed by Oncology. She also has known vascular disease with nonhealing right malleoi ulcer managed by wound therapy and previous unsuccessful angioplasty. She had recently presented to me for 2nd opinion.  Patient now presents for 2 week follow-up for PAD. Patient continues to have severe claudication symptoms of the right lower extremity and nonhealing ulceration. Newly developed left lower extremity ulceration appears to be venous stasis ulcer and she will need venous insufficiency study in the near future for further evaluation of this. Feel that patient would benefit from  repeat peripheral angiography given her continued symptoms and ulceration. Needs to schedule for peripheral arteriogram and possible angioplasty given symptoms. Patient understands the risks, benefits, alternatives including medical therapy, CT angiography. Patient understands <1-2% risk of death, embolic complications, bleeding, infection, renal failure, urgent surgical revascularization, but not limited to these and wants to proceed. I advised her daughter who is present at bedside to hold Avapro 2 days prior to her procedure. She's been given a prescription for antibiotics by her wound care physician that she will start in the next few days once available at her  pharmacy. We'll attempt to have her procedure scheduled for next week. We'll see her back after the procedure for reevaluation.  *I have discussed this case with Dr. Einar Gip and he personally examined the patient and participated in formulating the plan.*  CC Dr. Yaakov Guthrie. CC: Dossie Der, MD (Wound Care)    Signed by Gwinda Maine, FNP-C (11/11/2017 5:59 PM)

## 2017-11-20 NOTE — H&P (Signed)
OFFICE VISIT NOTES COPIED TO EPIC FOR DOCUMENTATION  . History of Present Illness Kristin Maine FNP-C; 11/11/2017 5:57 PM) Patient words: Last OV 10/27/2017; 2 week f/u for pad; Pt c/o L ankle is starting to get a wound.  The patient is a 84 year old female who presents for a Follow-up for Peripheral vascular disease. Kristin [name] is a elderly African-American female with hypertension, hyperlipidemia, chronic shortness breath and palpitations that is stable, recently diagnosed multiple myeloma being followed by Oncology. She also has known vascular disease with nonhealing right malleoi ulcer managed by wound therapy and previous unsuccessful angioplasty. She had recently presented to me for 2nd opinion.  She presents for 2 week follow up. She was started on Xarelto 2.67m and Irbesartan at her last OV. She continues to have severe pain in her right foot and has now also developed a wound on her left lower extremity. No falls or wound discharge, she is not a diabetic, she does not smoke tobacco.  She was fairly active until she had a fall and broke her right hip and had surgery for the same in March 2018.   Problem List/Past Medical (Kristin [name]; 32019/04/038:39 AM) Essential hypertension, benign (I10)  EKG 10/27/2017: Normal sinus rhythm at rate of 84 bpm, left atrial abnormality, left axis deviation, left anterior fascicular block. Poor progression, cannot exclude anteroseptal infarct old. LVH. Other dyspnea and respiratory abnormality (R06.09, R09.89)  Echocardiogram 08/05/2014: Left ventricle cavity is normal in size. Moderate concentric hypertrophy of the left ventricle. Borderline decrease in global wall motion. Doppler evidence of grade I (impaired) diastolic dysfunction. Diastolic dysfunction do not suggest elevated LA/LV endiastolic pressure. Left ventricle regional wall motion findings: No wall motion abnormalities. Visual EF is 50-55%. Calculated EF 67%. Mild calcification of the  aortic valve annulus. Mild aortic valve leaflet calcification. No aortic valve regurgitation noted. Mild mitral regurgitation. Mild calcification of the mitral valve annulus. Mild to moderate tricuspid regurgitation. Mild pulmonary hypertension. IVC is dilated with blunted respiratory response. Mild pulmonic regurgitation. Overall no significant change since 03/19/2012 and mild to moderate PHT was noted previously also. Lexiscan stress 03/28/12: Mild gut uptake artifact. No ischemia. Low risk stress test. Atherosclerosis of native artery of right lower extremity with ulceration of ankle ((S82.707  Peripheral arteriogram 10/09/2017: Two-vessel runoff left lower extremity with occluded posterior tibial, right lower extremity 0 vessel runoff. Unsuccessful attempt at PTCA peroneal artery. Laboratory examination (Z01.89)  Labs 11/03/2017: Serum cholesterol 105, triglycerides 55, HDL 44, LDL 50. Serum glucose 95 mg. BUN 18, creatinine 1.31, eGFR 37/43 mL, potassium 4.2. Labs 10/10/2017: BUN 11, creatinine 1.02, eGFR 56 mL, potassium 3.3. HB 9.5/HCT 28 15, normal indicis, platelets 198. Multiple myeloma, remission status unspecified (C90.00)  Feb 2018: [place] Dr. FBurr Medico MD Pure hypercholesterolemia (272.0)  Chronic kidney disease, stage 3 (N18.3)   Allergies (Kristin [name]; 3April 03, 20198:39 AM) SulfADIAZINE *SULFONAMIDES*  Swelling. facial swelling Penicillin V *PENICILLINS*  Hives.  Family History (Kristin [name] [id number] AM) Mother  Deceased. at age 7472from a stroke Father  Deceased. at age 82- no heart conditions. Siblings  Sister - 739years older Brother 132- 175years older BBrother 52 5107years younger 2 Brothers Deceased  Social History (Kristin [name] 304/03/20198:39 AM) Current tobacco use  Never smoker. Alcohol Use  Occasional alcohol use. Rarely drinks alcohol Marital status  Widowed. Living Situation  Lives alone. in apartment Number of Children  5.  Past Surgical History  (Kristin [name] [id number] AM)  Hysterectomy 1975  Hip Fracture & Surgery - Right [11/2016]: Cataract surgery on both eyes in 2009.  back surgery [2011]:  Medication History Kristin Furbish Johnson; 2017-11-25 8:49 AM) Xarelto (2.5MG Tablet, 1 (one) Tablet Oral two times daily with food, Taken starting 10/27/2017) Active. Atorvastatin Calcium (10MG Tablet, 1 (one) Tablet Oral daily, Taken starting 10/27/2017) Active. Irbesartan (75MG Tablet, 1 (one) Tablet Oral every evening after dinner, Taken starting 10/27/2017) Active. Aspirin (81MG Tablet, 1 Oral daily) Active. RisperiDONE (0.5MG Tablet, 1 Oral daily) Active. Restasis (0.05% Emulsion, 1drop in each eye Ophthalmic two times daily) Active. Meclizine HCl (25MG Tablet, 1 Oral three times daily as needed.) Active. Calcium 600 mg (1 daily) Active. Furosemide (20MG Tablet, 1 Oral daily) Active. Lenalidomide (10MG Capsule, 1 for 21 days then off 7 days Oral daily) Active. Megestrol Acetate (40MG Tablet, 1 Oral daily) Active. Morphine Sulfate (30MG Tablet, 1 every 12 hours Oral daily) Active. Ondansetron (4MG Tablet Disint, 1-2 every 8 hrs Oral as needed) Active. OxyCODONE HCl (10MG Tablet, 1 every 6 hrs Oral as needed for pain) Active. Senokot S (8.6-50MG Tablet, 2 Oral daily) Active. Santyl (250UNIT/GM Ointment, External daily) Active. Linezolid (600MG Tablet, 1 Oral two times daily) Active. (has not started yet) Medications Reconciled (verbally)  Diagnostic Studies History Kristin Furbish Johnson; 11/25/2017 8:39 AM) Colonoscopy 2009 (normal)  Lexiscan stress 03/28/12: Mild gut uptake artifact. No ischemia. Low risk stress test.  Echocardiogram [08/05/2014]: Left ventricle cavity is normal in size. Moderate concentric hypertrophy of the left ventricle. Borderline decrease in global wall motion. Doppler evidence of grade I (impaired) diastolic dysfunction. Diastolic dysfunction do not suggest elevated LA/LV endiastolic pressure.  Left ventricle regional wall motion findings: No wall motion abnormalities. Visual EF is 50-55%. Calculated EF 67%. Mild calcification of the aortic valve annulus. Mild aortic valve leaflet calcification. No aortic valve regurgitation noted. Mild mitral regurgitation. Mild calcification of the mitral valve annulus. Mild to moderate tricuspid regurgitation. Mild pulmonary hypertension. IVC is dilated with blunted respiratory response. Mild pulmonic regurgitation. Overall no significant change since 03/19/2012. Peripheral arteriogram:  10/09/2017: Two-vessel runoff left lower extremity with occluded posterior tibial, right lower extremity 0 vessel runoff. Unsuccessful attempt at PTCA peroneal artery. Doppler Ultrasound  Lower extremity arterial duplex 10/02/2017: Right ABI 0.77, left ABI 0.99. Technically difficult study. Disease below knee bilateral. Holter [04/30/2015]:  Other Problems Kristin Furbish Johnson; 2017-11-25 8:39 AM) Broken bones in back 2012.     Review of Systems Kristin Maine FNP-C; November 25, 2017 12:59 PM) General Not Present- Fatigue, Fever and Night Sweats. Respiratory Not Present- Dyspnea. Cardiovascular Present- Claudications (right leg worse than left) and Swelling of Extremities (right foot swelling). Not Present- Chest Pain, Fainting, Orthopnea and Palpitations. Gastrointestinal Not Present- Abdominal Pain, Constipation, Diarrhea, Nausea and Vomiting. Musculoskeletal Present- Physical Disability (since right hip replacement in March 2018). Not Present- Joint Swelling. Neurological Not Present- Dizziness, Focal Neurological Symptoms, Headaches and Syncope. Hematology Not Present- Blood Clots, Easy Bruising and Nose Bleed. All other systems negative  Vitals Kristin Furbish Johnson; 11-25-2017 8:48 AM) Nov 25, 2017 8:41 AM Weight: 136.19 lb Height: 59in Body Surface Area: 1.57 m Body Mass Index: 27.51 kg/m  Pulse: 85 (Regular)  P.OX: 92% (Room air) BP: 130/60 (Sitting, Left  Arm, Standard)       Physical Exam Kristin Maine FNP-C; 11-25-17 9:09 AM) General Mental Status-Alert. General Appearance-Cooperative, Appears stated age, Not in acute distress. Orientation-Oriented X3. Build & Nutrition-Well nourished and Moderately built.  Head and Neck Thyroid Gland Characteristics - no palpable nodules, no palpable enlargement.  Chest  and Lung Exam Chest and lung exam reveals -quiet, even and easy respiratory effort with no use of accessory muscles. Palpation Tender - No chest wall tenderness.  Cardiovascular Cardiovascular examination reveals -normal heart sounds, regular rate and rhythm with no murmurs and carotid auscultation reveals no bruits. Inspection Jugular vein - Right - No Distention.  Abdomen Palpation/Percussion Normal exam - Non Tender and No hepatosplenomegaly. Auscultation Normal exam - Bowel sounds normal.  Peripheral Vascular Lower Extremity Inspection - Left - Erythematous and Ulcerated(new non-draining, closed ulceration to left lower lateral extremity). Right - Ulcerated(right lateral malleolar area, no bone exposure). Palpation - Temperature - Bilateral - Normal. Edema - Left - No edema. Right - 2+ Pitting edema. Femoral pulse - Bilateral - Normal. Popliteal pulse - Bilateral - Normal. Dorsalis pedis pulse - Left - Normal. Right - Absent. Posterior tibial pulse - Bilateral - Absent. Abdomen-No prominent abdominal aortic pulsation.  Neurologic Motor-Grossly intact without any focal deficits.  Musculoskeletal - Did not examine.    Assessment & Plan Kristin Maine FNP-C; 11/11/2017 5:59 PM) Atherosclerosis of native artery of right lower extremity with ulceration of ankle (W29.937) Story: Peripheral arteriogram 10/09/2017: Two-vessel runoff left lower extremity with occluded posterior tibial, right lower extremity 0 vessel runoff. Unsuccessful attempt at PTCA peroneal artery. Impression: Lower extremity  arterial duplex 10/02/2017: Right ABI 0.77, left ABI 0.99. Technically difficult study. Disease below knee bilateral. Current Plans METABOLIC PANEL, BASIC (16967) CBC & PLATELETS (AUTO) (89381) PT (PROTHROMBIN TIME) (01751) Essential hypertension, benign (I10) Story: EKG 10/27/2017: Normal sinus rhythm at rate of 84 bpm, left atrial abnormality, left axis deviation, left anterior fascicular block. Poor progression, cannot exclude anteroseptal infarct old. LVH. Multiple myeloma, remission status unspecified (C90.00) Story: Feb 2018: Follows Dr. Burr Medico, MD Chronic kidney disease, stage 3 (N18.3) Venous stasis ulcer (I83.009) Story: left lateral lower extremity Laboratory examination (Z01.89) 11/15/2017: Glucose 151, creatinine 1.32, EGFR 37/42, potassium 4.5, calcium low at 8.4, BMP otherwise normal.  RBC 3.24, hemoglobin 10.2, hematocrit 31.2, RDW 16.1%, CBC otherwise normal.  INR 1.1, prothrombin time 11.0.  Labs 11/03/2017: Serum cholesterol 105, triglycerides 55, HDL 44, LDL 50. Serum glucose 95 mg. BUN 18, creatinine 1.31, eGFR 37/43 mL, potassium 4.2.  Labs 10/10/2017: BUN 11, creatinine 1.02, eGFR 56 mL, potassium 3.3. HB 9.5/HCT 28 15, normal indicis, platelets 198.  Note:-  Recommendations:  Ms. Kristin [name] is a elderly African-American female with hypertension, hyperlipidemia, chronic shortness breath and palpitations that is stable, recently diagnosed multiple myeloma being followed by Oncology. She also has known vascular disease with nonhealing right malleoi ulcer managed by wound therapy and previous unsuccessful angioplasty. She had recently presented to me for 2nd opinion.  Patient now presents for 2 week follow-up for PAD. Patient continues to have severe claudication symptoms of the right lower extremity and nonhealing ulceration. Newly developed left lower extremity ulceration appears to be venous stasis ulcer and she will need venous insufficiency study in the near future  for further evaluation of this. Feel that patient would benefit from repeat peripheral angiography given her continued symptoms and ulceration. Needs to schedule for peripheral arteriogram and possible angioplasty given symptoms. Patient understands the risks, benefits, alternatives including medical therapy, CT angiography. Patient understands <1-2% risk of death, embolic complications, bleeding, infection, renal failure, urgent surgical revascularization, but not limited to these and wants to proceed. I advised her daughter who is present at bedside to hold Avapro 2 days prior to her procedure. She's been given a prescription for antibiotics by her  wound care physician that she will start in the next few days once available at her pharmacy. We'll attempt to have her procedure scheduled for next week. We'll see her back after the procedure for reevaluation.  *I have discussed this case with Dr. Einar Gip and he personally examined the patient and participated in formulating the plan.*  CC Dr. Yaakov Guthrie. CC: Dossie Der, MD (Wound Care)  Signed by Kristin Maine, FNP-C (11/11/2017 5:59 PM)

## 2017-11-20 NOTE — Progress Notes (Deleted)
11/15/2017: Glucose 151, creatinine 1.32, EGFR 37/42, potassium 4.5, calcium low at 8.4, BMP otherwise normal.  RBC 3.24, hemoglobin 10.2, hematocrit 31.2, RDW 16.1%, CBC otherwise normal.  INR 1.1, prothrombin time 11.0.

## 2017-11-21 ENCOUNTER — Ambulatory Visit (HOSPITAL_COMMUNITY)
Admission: RE | Disposition: A | Discharge status: Home / Self Care | Payer: Self-pay | Source: Ambulatory Visit | Attending: Cardiology

## 2017-11-21 ENCOUNTER — Ambulatory Visit (HOSPITAL_COMMUNITY)
Admission: RE | Admit: 2017-11-21 | Discharge: 2017-11-21 | Disposition: A | Discharge status: Home / Self Care | Payer: Medicare Other | Source: Ambulatory Visit | Attending: Cardiology | Admitting: Cardiology

## 2017-11-21 DIAGNOSIS — I70229 Atherosclerosis of native arteries of extremities with rest pain, unspecified extremity: Secondary | ICD-10-CM | POA: Diagnosis present

## 2017-11-21 DIAGNOSIS — C9000 Multiple myeloma not having achieved remission: Secondary | ICD-10-CM | POA: Diagnosis not present

## 2017-11-21 DIAGNOSIS — Z7901 Long term (current) use of anticoagulants: Secondary | ICD-10-CM | POA: Diagnosis not present

## 2017-11-21 DIAGNOSIS — I998 Other disorder of circulatory system: Secondary | ICD-10-CM | POA: Diagnosis present

## 2017-11-21 DIAGNOSIS — Z880 Allergy status to penicillin: Secondary | ICD-10-CM | POA: Insufficient documentation

## 2017-11-21 DIAGNOSIS — L97519 Non-pressure chronic ulcer of other part of right foot with unspecified severity: Secondary | ICD-10-CM | POA: Insufficient documentation

## 2017-11-21 DIAGNOSIS — N183 Chronic kidney disease, stage 3 (moderate): Secondary | ICD-10-CM | POA: Insufficient documentation

## 2017-11-21 DIAGNOSIS — I129 Hypertensive chronic kidney disease with stage 1 through stage 4 chronic kidney disease, or unspecified chronic kidney disease: Secondary | ICD-10-CM | POA: Insufficient documentation

## 2017-11-21 DIAGNOSIS — E7800 Pure hypercholesterolemia, unspecified: Secondary | ICD-10-CM | POA: Diagnosis not present

## 2017-11-21 DIAGNOSIS — Z7982 Long term (current) use of aspirin: Secondary | ICD-10-CM | POA: Insufficient documentation

## 2017-11-21 DIAGNOSIS — I739 Peripheral vascular disease, unspecified: Secondary | ICD-10-CM | POA: Diagnosis present

## 2017-11-21 DIAGNOSIS — Z882 Allergy status to sulfonamides status: Secondary | ICD-10-CM | POA: Diagnosis not present

## 2017-11-21 DIAGNOSIS — I70235 Atherosclerosis of native arteries of right leg with ulceration of other part of foot: Secondary | ICD-10-CM | POA: Diagnosis not present

## 2017-11-21 HISTORY — PX: LOWER EXTREMITY ANGIOGRAPHY: CATH118251

## 2017-11-21 LAB — POCT ACTIVATED CLOTTING TIME
Activated Clotting Time: 312 seconds
Activated Clotting Time: 246 seconds
Activated Clotting Time: 202 seconds
Activated Clotting Time: 191 seconds
Activated Clotting Time: 175 seconds

## 2017-11-21 SURGERY — LOWER EXTREMITY ANGIOGRAPHY
Anesthesia: LOCAL

## 2017-11-21 MED ORDER — SODIUM CHLORIDE 0.9 % IV SOLN: 250.0000 mL | INTRAVENOUS | Status: DC | PRN

## 2017-11-21 MED ORDER — LABETALOL HCL 5 MG/ML IV SOLN: 10.0000 mg | INTRAVENOUS | Status: DC | PRN

## 2017-11-21 MED ORDER — HEPARIN SODIUM (PORCINE) 1000 UNIT/ML IJ SOLN
INTRAMUSCULAR | Status: DC | PRN
  Administered 2017-11-21: 8000 [IU] via INTRAVENOUS

## 2017-11-21 MED ORDER — SODIUM CHLORIDE 0.9% FLUSH: 3.0000 mL | Freq: Two times a day (BID) | INTRAVENOUS | Status: DC

## 2017-11-21 MED ORDER — NITROGLYCERIN 1 MG/10 ML FOR IR/CATH LAB
INTRA_ARTERIAL | Status: AC
  Filled 2017-11-21: qty 10

## 2017-11-21 MED ORDER — SODIUM CHLORIDE 0.9 % IV SOLN: INTRAVENOUS | Status: DC

## 2017-11-21 MED ORDER — SODIUM CHLORIDE 0.9% FLUSH: 3.0000 mL | INTRAVENOUS | Status: DC | PRN

## 2017-11-21 MED ORDER — SODIUM CHLORIDE 0.9 % IV SOLN
INTRAVENOUS | Status: DC
  Administered 2017-11-21: 08:00:00 via INTRAVENOUS

## 2017-11-21 MED ORDER — SODIUM CHLORIDE 0.9 % WEIGHT BASED INFUSION: 1.0000 mL/kg/h | INTRAVENOUS | Status: AC

## 2017-11-21 MED ORDER — MIDAZOLAM HCL 2 MG/2ML IJ SOLN
INTRAMUSCULAR | Status: AC
  Filled 2017-11-21: qty 2

## 2017-11-21 MED ORDER — FENTANYL CITRATE (PF) 100 MCG/2ML IJ SOLN
INTRAMUSCULAR | Status: AC
  Filled 2017-11-21: qty 2

## 2017-11-21 MED ORDER — HEPARIN (PORCINE) IN NACL 2-0.9 UNIT/ML-% IJ SOLN
INTRAMUSCULAR | Status: AC
  Filled 2017-11-21: qty 1000

## 2017-11-21 MED ORDER — HYDRALAZINE HCL 20 MG/ML IJ SOLN: 5.0000 mg | INTRAMUSCULAR | Status: DC | PRN

## 2017-11-21 MED ORDER — LIDOCAINE HCL (PF) 1 % IJ SOLN
INTRAMUSCULAR | Status: DC | PRN
  Administered 2017-11-21: 15 mL

## 2017-11-21 MED ORDER — IODIXANOL 320 MG/ML IV SOLN
INTRAVENOUS | Status: DC | PRN
  Administered 2017-11-21: 75 mL via INTRAVENOUS

## 2017-11-21 MED ORDER — MIDAZOLAM HCL 2 MG/2ML IJ SOLN
INTRAMUSCULAR | Status: DC | PRN
  Administered 2017-11-21: 1 mg via INTRAVENOUS

## 2017-11-21 MED ORDER — FENTANYL CITRATE (PF) 100 MCG/2ML IJ SOLN
INTRAMUSCULAR | Status: DC | PRN
  Administered 2017-11-21 (×2): 25 ug via INTRAVENOUS

## 2017-11-21 MED ORDER — LIDOCAINE HCL 1 % IJ SOLN
INTRAMUSCULAR | Status: AC
  Filled 2017-11-21: qty 20

## 2017-11-21 MED ORDER — ONDANSETRON HCL 4 MG/2ML IJ SOLN: 4.0000 mg | Freq: Four times a day (QID) | INTRAMUSCULAR | Status: DC | PRN

## 2017-11-21 MED ORDER — HEPARIN SODIUM (PORCINE) 1000 UNIT/ML IJ SOLN
INTRAMUSCULAR | Status: AC
  Filled 2017-11-21: qty 1

## 2017-11-21 MED ORDER — ACETAMINOPHEN 325 MG PO TABS: 650.0000 mg | ORAL_TABLET | ORAL | Status: DC | PRN

## 2017-11-21 MED ORDER — HEPARIN (PORCINE) IN NACL 2-0.9 UNIT/ML-% IJ SOLN
INTRAMUSCULAR | Status: AC | PRN
  Administered 2017-11-21 (×2): 500 mL

## 2017-11-21 SURGICAL SUPPLY — 25 items
BAG SNAP BAND KOVER 36X36 (MISCELLANEOUS) ×1 IMPLANT
CATH CXI 2.3F 150 ST (CATHETERS) ×1 IMPLANT
CATH TEMPO 5F RIM 65CM (CATHETERS) ×1 IMPLANT
COVER PRB 48X5XTLSCP FOLD TPE (BAG) IMPLANT
COVER PROBE 5X48 (BAG) ×2
DEVICE CONTINUOUS FLUSH (MISCELLANEOUS) ×1 IMPLANT
KIT HEMO VALVE WATCHDOG (MISCELLANEOUS) ×1 IMPLANT
KIT MICROPUNCTURE NIT STIFF (SHEATH) ×1 IMPLANT
KIT PV (KITS) ×2 IMPLANT
SHEATH AVANTI 11CM 5FR (SHEATH) ×1 IMPLANT
SHEATH AVANTI 11CM 6FR (SHEATH) ×1 IMPLANT
SHEATH SHUTTLE SELECT 6F (SHEATH) ×1 IMPLANT
SHIELD RADPAD SCOOP 12X17 (MISCELLANEOUS) ×1 IMPLANT
STOPCOCK MORSE 400PSI 3WAY (MISCELLANEOUS) ×1 IMPLANT
TAPE VIPERTRACK RADIOPAQ (MISCELLANEOUS) IMPLANT
TAPE VIPERTRACK RADIOPAQUE (MISCELLANEOUS) ×2
TRANSDUCER W/STOPCOCK (MISCELLANEOUS) ×2 IMPLANT
TRAY PV CATH (CUSTOM PROCEDURE TRAY) ×2 IMPLANT
TUBING CIL FLEX 10 FLL-RA (TUBING) ×1 IMPLANT
WIRE APROACH 18G .014X300CM (WIRE) ×1 IMPLANT
WIRE APROACH 25G .014X300CM (WIRE) ×1 IMPLANT
WIRE AQUATRAK .035X260 ANG (WIRE) ×1 IMPLANT
WIRE HITORQ VERSACORE ST 145CM (WIRE) ×1 IMPLANT
WIRE HYDRO ST .014 300CM (WIRE) ×1 IMPLANT
WIRE ROSEN-J .035X260CM (WIRE) ×1 IMPLANT

## 2017-11-21 NOTE — Progress Notes (Signed)
Site area: left groin fa sheath Site Prior to Removal:  Level 0 Pressure Applied For: 20 minutes Manual:   yes Patient Status During Pull:  stable Post Pull Site:  Level 0 Post Pull Instructions Given:  yes Post Pull Pulses Present: dopplered left peroneal Dressing Applied:  Gauze and tegaderm Bedrest begins @  2194 Comments:

## 2017-11-21 NOTE — Discharge Instructions (Signed)

## 2017-11-21 NOTE — Interval H&P Note (Signed)
History and Physical Interval Note:  11/21/2017 7:45 AM  Kristin [name]  has presented today for surgery, with the diagnosis of pad  The various methods of treatment have been discussed with the patient and family. After consideration of risks, benefits and other options for treatment, the patient has consented to  Procedure(s): LOWER EXTREMITY ANGIOGRAPHY (N/A) and possible angioplasty as a surgical intervention .  The patient's history has been reviewed, patient examined, no change in status, stable for surgery.  I have reviewed the patient's chart and labs.  Questions were answered to the patient's satisfaction.     Adrian Prows

## 2017-11-21 NOTE — Progress Notes (Signed)
Site area: LFA Site Prior to Removal:  Level 0 Pressure Applied For: Manual: yes   Patient Status During Pull:   Post Pull Site:  Level Post Pull Instructions Given:  yes Post Pull Pulses Present: unable to access Dressing Applied: gauze  Bedrest begins @  Comments:

## 2017-11-22 ENCOUNTER — Encounter (HOSPITAL_COMMUNITY): Payer: Self-pay | Admitting: Cardiology

## 2017-11-22 MED FILL — Heparin Sodium (Porcine) 2 Unit/ML in Sodium Chloride 0.9%: INTRAMUSCULAR | Qty: 1000 | Status: AC

## 2017-11-22 MED FILL — Lidocaine HCl Local Inj 1%: INTRAMUSCULAR | Qty: 20 | Status: AC

## 2017-11-22 MED FILL — Nitroglycerin IV Soln 100 MCG/ML in D5W: INTRA_ARTERIAL | Qty: 10 | Status: AC

## 2017-11-23 DIAGNOSIS — L97312 Non-pressure chronic ulcer of right ankle with fat layer exposed: Secondary | ICD-10-CM | POA: Diagnosis not present

## 2017-11-24 ENCOUNTER — Other Ambulatory Visit: Payer: Self-pay | Admitting: *Deleted

## 2017-11-24 ENCOUNTER — Inpatient Hospital Stay: Payer: Medicare Other

## 2017-11-24 ENCOUNTER — Inpatient Hospital Stay: Payer: Medicare Other | Attending: Hematology

## 2017-11-24 DIAGNOSIS — C9000 Multiple myeloma not having achieved remission: Secondary | ICD-10-CM

## 2017-11-24 DIAGNOSIS — Z79899 Other long term (current) drug therapy: Secondary | ICD-10-CM | POA: Insufficient documentation

## 2017-11-24 LAB — CBC WITH DIFFERENTIAL/PLATELET
Basophils Absolute: 0.1 10*3/uL (ref 0.0–0.1)
Basophils Relative: 2 %
EOS PCT: 8 %
Eosinophils Absolute: 0.3 10*3/uL (ref 0.0–0.5)
HEMATOCRIT: 26.6 % — AB (ref 34.8–46.6)
Hemoglobin: 8.7 g/dL — ABNORMAL LOW (ref 11.6–15.9)
LYMPHS ABS: 1.2 10*3/uL (ref 0.9–3.3)
LYMPHS PCT: 33 %
MCH: 31.5 pg (ref 25.1–34.0)
MCHC: 32.7 g/dL (ref 31.5–36.0)
MCV: 96.4 fL (ref 79.5–101.0)
MONO ABS: 0.2 10*3/uL (ref 0.1–0.9)
MONOS PCT: 6 %
NEUTROS ABS: 1.9 10*3/uL (ref 1.5–6.5)
Neutrophils Relative %: 51 %
PLATELETS: 137 10*3/uL — AB (ref 145–400)
RBC: 2.76 MIL/uL — ABNORMAL LOW (ref 3.70–5.45)
RDW: 17.1 % — AB (ref 11.2–14.5)
WBC: 3.7 10*3/uL — ABNORMAL LOW (ref 3.9–10.3)

## 2017-11-24 LAB — COMPREHENSIVE METABOLIC PANEL
ALT: 7 U/L (ref 0–55)
AST: 12 U/L (ref 5–34)
Albumin: 2.9 g/dL — ABNORMAL LOW (ref 3.5–5.0)
Alkaline Phosphatase: 62 U/L (ref 40–150)
Anion gap: 8 (ref 3–11)
BILIRUBIN TOTAL: 0.4 mg/dL (ref 0.2–1.2)
BUN: 18 mg/dL (ref 7–26)
CHLORIDE: 108 mmol/L (ref 98–109)
CO2: 24 mmol/L (ref 22–29)
Calcium: 8 mg/dL — ABNORMAL LOW (ref 8.4–10.4)
Creatinine, Ser: 1.43 mg/dL — ABNORMAL HIGH (ref 0.60–1.10)
GFR calc Af Amer: 38 mL/min — ABNORMAL LOW (ref >60)
GFR, EST NON AFRICAN AMERICAN: 32 mL/min — AB (ref >60)
Glucose, Bld: 143 mg/dL — ABNORMAL HIGH (ref 70–140)
POTASSIUM: 3.8 mmol/L (ref 3.5–5.1)
Sodium: 140 mmol/L (ref 136–145)
TOTAL PROTEIN: 6.4 g/dL (ref 6.4–8.3)

## 2017-11-24 MED ORDER — DENOSUMAB 120 MG/1.7ML ~~LOC~~ SOLN
120.0000 mg | Freq: Once | SUBCUTANEOUS | Status: AC
  Administered 2017-11-24: 120 mg via SUBCUTANEOUS

## 2017-11-24 MED ORDER — DENOSUMAB 120 MG/1.7ML ~~LOC~~ SOLN
SUBCUTANEOUS | Status: AC
  Filled 2017-11-24: qty 1.7

## 2017-11-24 MED ORDER — LENALIDOMIDE 10 MG PO CAPS: 10.0000 mg | ORAL_CAPSULE | Freq: Every day | ORAL | 0 refills | Status: DC

## 2017-11-24 NOTE — Progress Notes (Signed)
Pt calcium is 8.0. Was given the ok per pharmacy to give injection. Pt family member confirmed that she is taking calcium twice a day instead of once a day now. Results were given to pt family member. Porsche Cates LPN

## 2017-11-27 LAB — KAPPA/LAMBDA LIGHT CHAINS
KAPPA FREE LGHT CHN: 88.6 mg/L — AB (ref 3.3–19.4)
KAPPA, LAMDA LIGHT CHAIN RATIO: 1.09 (ref 0.26–1.65)
Lambda free light chains: 81.6 mg/L — ABNORMAL HIGH (ref 5.7–26.3)

## 2017-11-28 ENCOUNTER — Telehealth: Payer: Self-pay | Admitting: *Deleted

## 2017-11-28 NOTE — Telephone Encounter (Signed)
Spoke with pt to inform her of lab results.  Pt requested nurse to call her daughter Hassan Rowan for all communication issues.  Pt stated her daughter would be able to answer any questions. Called Hassan Rowan and left message on voice mail requesting a call back to nurse.

## 2017-11-28 NOTE — Telephone Encounter (Signed)
-----   Message from Truitt Merle, MD sent at 11/25/2017 11:31 AM EDT ----- Please let pt know her Cr and anemia was slightly worse yesterday, call us if she feels very fatigued, and we can see her earlier than she scheduled appointment in 4 weeks. Encourage her to drink fluids adequately.   Thanks  Truitt Merle  11/25/2017

## 2017-11-29 LAB — MULTIPLE MYELOMA PANEL, SERUM
ALBUMIN/GLOB SERPL: 0.9 (ref 0.7–1.7)
ALPHA 1: 0.2 g/dL (ref 0.0–0.4)
ALPHA2 GLOB SERPL ELPH-MCNC: 0.7 g/dL (ref 0.4–1.0)
Albumin SerPl Elph-Mcnc: 2.9 g/dL (ref 2.9–4.4)
B-GLOBULIN SERPL ELPH-MCNC: 1 g/dL (ref 0.7–1.3)
Gamma Glob SerPl Elph-Mcnc: 1.4 g/dL (ref 0.4–1.8)
Globulin, Total: 3.3 g/dL (ref 2.2–3.9)
IGG (IMMUNOGLOBIN G), SERUM: 1362 mg/dL (ref 700–1600)
IGM (IMMUNOGLOBULIN M), SRM: 23 mg/dL — AB (ref 26–217)
IgA: 525 mg/dL — ABNORMAL HIGH (ref 64–422)
Total Protein ELP: 6.2 g/dL (ref 6.0–8.5)

## 2017-11-30 DIAGNOSIS — L97312 Non-pressure chronic ulcer of right ankle with fat layer exposed: Secondary | ICD-10-CM | POA: Diagnosis not present

## 2017-12-05 ENCOUNTER — Ambulatory Visit
Admission: RE | Admit: 2017-12-05 | Discharge: 2017-12-05 | Disposition: A | Discharge status: Home / Self Care | Payer: Medicare Other | Source: Ambulatory Visit | Attending: Cardiology | Admitting: Cardiology

## 2017-12-05 ENCOUNTER — Other Ambulatory Visit: Payer: Self-pay | Admitting: Cardiology

## 2017-12-05 DIAGNOSIS — I872 Venous insufficiency (chronic) (peripheral): Secondary | ICD-10-CM

## 2017-12-05 DIAGNOSIS — I82513 Chronic embolism and thrombosis of femoral vein, bilateral: Secondary | ICD-10-CM

## 2017-12-05 HISTORY — DX: Chronic embolism and thrombosis of femoral vein, bilateral: I82.513

## 2017-12-05 NOTE — Consult Note (Signed)
Chief Complaint: Patient was seen in consultation today for No chief complaint on file.  at the request of Ganji,Jay  Referring Physician(s): Ganji,Jay  History of Present Illness: Kristin [name] is a 84 y.o. female who is referred a venous insufficiency evaluation and lower extremity ulcers. According to her daughter, she developed a right leg ulcer which was poorly healing. She has had ulcers on the left as well. She also describes episodic edema at the ankles and lower extremity pain with ambulation. She is gravida 5 para 5. She has not had any prior history of venous insufficiency. She did undergo lower extremity arteriography demonstrating severe disease of the right leg tibial arterial system.  Past Medical History:  Diagnosis Date  . Bone cancer [facility])    "being tx'd by Dr. Burr Medico @ [facility]" (10/09/2017)  . Chronic kidney disease (CKD), stage III (moderate) (HCC)   . Depression    Risperdal "twice/day" (09/23/2016)  . Glaucoma   . Hiatal hernia   . Hypertension   . Intractable back pain    /notes 09/23/2016  . Obesity   . Osteoarthritis    "all over" (10/09/2017)  . Osteoporosis    vertebral fracture in 2012  . Peripheral vascular disease ([place])   . Upper GI bleed 04/2004   secondary to Mallory-Weiss tear/notes 12/28/2010  . Vertigo     Past Surgical History:  Procedure Laterality Date  . ABDOMINAL HYSTERECTOMY  1974   partial  . APPENDECTOMY     as a child/notes 12/28/2010  . BACK SURGERY    . BREAST BIOPSY Left 1999   Archie Endo 12/28/2010  . CATARACT EXTRACTION W/ INTRAOCULAR LENS  IMPLANT, BILATERAL Bilateral 2000s  . ESOPHAGOGASTRODUODENOSCOPY  04/2004   Archie Endo 12/28/2010  . FIXATION KYPHOPLASTY THORACIC SPINE  05/2011   T10/notes 05/25/2011  . FRACTURE SURGERY    . HIP ARTHROPLASTY Right 01/19/2017   Procedure: RIGHT HIP HEMIARTHROPLASTY;  Surgeon: Renette Butters, MD;  Location: [facility];  Service: Orthopedics;  Laterality: Right;  . LAPAROSCOPIC  CHOLECYSTECTOMY  10/2007   Archie Endo 12/14/2010  . LOWER EXTREMITY ANGIOGRAPHY N/A 10/09/2017   Procedure: LOWER EXTREMITY ANGIOGRAPHY;  Surgeon: Lorretta Harp, MD;  Location: [facility];  Service: Cardiovascular;  Laterality: N/A;  bilateral  . LOWER EXTREMITY ANGIOGRAPHY  11/21/2017   Procedure: Lower Extremity Angiography;  Surgeon: Adrian Prows, MD;  Location: [facility];  Service: Cardiovascular;;  . PERIPHERAL VASCULAR BALLOON ANGIOPLASTY  10/09/2017   Attempted PTA Archie Endo 10/09/2017  . PERIPHERAL VASCULAR BALLOON ANGIOPLASTY  10/09/2017   Procedure: PERIPHERAL VASCULAR BALLOON ANGIOPLASTY;  Surgeon: Lorretta Harp, MD;  Location: [facility];  Service: Cardiovascular;;  Attempted PTA     Allergies: Penicillins; Alendronate; and Sulfonamide derivatives  Medications: Prior to Admission medications   Medication Sig Start Date End Date Taking? Authorizing Provider  aspirin 81 MG chewable tablet Chew 81 mg by mouth daily.    [provider]  atorvastatin (LIPITOR) 10 MG tablet Take 10 mg by mouth daily.    [provider]  Calcium Carbonate (CALCIUM 600 PO) Take 600 mg by mouth daily.     [provider]  feeding supplement, ENSURE ENLIVE, (ENSURE ENLIVE) LIQD Take 237 mLs by mouth 2 (two) times daily between meals. Patient taking differently: Take 237 mLs by mouth daily.  10/25/16   Florencia Reasons, MD  furosemide (LASIX) 20 MG tablet Take 20 mg by mouth daily.    [provider]  irbesartan (AVAPRO) 75 MG tablet Take 75 mg by mouth daily after supper.    [provider]  lenalidomide (REVLIMID) 10 MG capsule Take 1 capsule (10 mg total) by mouth daily. Take Revlimid 10 mg by mouth daily for  21 days ;  Off  7 days. 11/24/17   Truitt Merle, MD  magnesium oxide (MAG-OX) 400 (241.3 Mg) MG tablet Take 1 tablet (400 mg total) by mouth daily. 10/26/16   Sheikh, Omair Latif, DO  meclizine (ANTIVERT) 25 MG tablet TAKE ONE TABLET BY MOUTH THREE  TIMES DAILY AS NEEDED Patient taking differently: Take 25 mg by mouth 3 (three) times daily as needed for dizziness.  06/20/14   Chipper Herb, MD  megestrol (MEGACE) 40 MG tablet Take 1 tablet (40 mg total) by mouth daily. 01/24/17   Rai, Ripudeep Raliegh Ip, MD  mirtazapine (REMERON) 15 MG tablet Take 1 tablet (15 mg total) by mouth at bedtime. 05/25/17   Truitt Merle, MD  morphine ([name]) 30 MG 12 hr tablet Take 1 tablet (30 mg total) by mouth every 12 (twelve) hours. 11/20/17   Truitt Merle, MD  ondansetron (ZOFRAN ODT) 4 MG disintegrating tablet Take 1-2 tablets (4-8 mg total) by mouth every 8 (eight) hours as needed for nausea or vomiting. Patient taking differently: Take 4 mg by mouth daily.  02/27/17   Truitt Merle, MD  Oxycodone HCl 10 MG TABS Take 1 tablet (10 mg total) by mouth every 6 (six) hours as needed. FOR MODERATE OR SEVERE PAIN Patient taking differently: Take 10 mg by mouth See admin instructions. Take 10 mg by mouth daily at 1700 09/15/17   Truitt Merle, MD  RESTASIS 0.05 % ophthalmic emulsion Place 1 drop into both eyes daily.  01/31/13   [provider]  risperiDONE (RISPERDAL) 0.5 MG tablet Take 0.5 mg by mouth 2 (two) times daily.    [provider]  SANTYL ointment Apply 1 application topically every other day. 11/07/17   [provider]  sennosides-docusate sodium (SENOKOT-S) 8.6-50 MG tablet Take 1 tablet by mouth 2 (two) times daily.     [provider]  XARELTO 2.5 MG TABS tablet Take 2.5 mg by mouth 2 (two) times daily with a meal. 10/30/17   [provider]     Family History  Problem Relation Age of Onset  . Hypertension Mother   . Osteoporosis Mother   . CVA Mother   . Cancer Neg Hx     Social History   Socioeconomic History  . Marital status: Widowed    Spouse name: Not on file  . Number of children: Not on file  . Years of education: Not on file  . Highest education level: Not on file  Occupational History  . Not on file    Social Needs  . Financial resource strain: Not on file  . Food insecurity:    Worry: Not on file    Inability: Not on file  . Transportation needs:    Medical: Not on file    Non-medical: Not on file  Tobacco Use  . Smoking status: Never Smoker  . Smokeless tobacco: Never Used  Substance and Sexual Activity  . Alcohol use: Yes    Comment: 10/09/2017  "glass of wine a few times/year"  . Drug use: No  . Sexual activity: Never  Lifestyle  . Physical activity:    Days per week: Not on file    Minutes per session: Not on file  . Stress:  Not on file  Relationships  . Social connections:    Talks on phone: Not on file    Gets together: Not on file    Attends religious service: Not on file    Active member of club or organization: Not on file    Attends meetings of clubs or organizations: Not on file    Relationship status: Not on file  Other Topics Concern  . Not on file  Social History Narrative  . Not on file     Review of Systems: A 12 point ROS discussed and pertinent positives are indicated in the HPI above.  All other systems are negative.  Review of Systems  No fevers or chills. No nausea or vomiting. No chest pain.  Vital Signs: There were no vitals taken for this visit.  Physical Exam Examination of the lower extremities demonstrates dressings in place on both legs. There is 1+ edema towards the ankles. Pulses are nonpalpable at the feet. The lower 70s are warm and pink.  Imaging: No results found.  Ultrasound was performed consisting of a venous Doppler study of the lower extremities. Limited examination of the greater saphenous veins demonstrates no evidence of venous insufficiency and no varicosities. There was chronic partially occlusive DVT throughout the bilateral femoral veins.  Labs:  CBC: Recent Labs    10/05/17 1147 10/10/17 0337 10/27/17 1327 11/24/17 1037  WBC 4.9 5.1 4.7 3.7*  HGB 10.6* 9.5* 10.2* 8.7*  HCT 32.6* 28.5* 30.9* 26.6*  PLT  228 198 166 137*    COAGS: Recent Labs    10/05/17 1147  INR 1.0  APTT 32    BMP: Recent Labs    10/05/17 1147 10/10/17 0337 10/27/17 1327 11/24/17 1037  NA 142 140 140 140  K 3.6 3.3* 3.7 3.8  CL 103 106 105 108  CO2 20 24 27 24   GLUCOSE 102* 91 110 143*  BUN 15 11 15 18   CALCIUM 8.7 7.9* 8.7 8.0*  CREATININE 1.12* 1.02* 1.09 1.43*  GFRNONAA 45* 49* 45* 32*  GFRAA 52* 56* 52* 38*    LIVER FUNCTION TESTS: Recent Labs    08/18/17 1317 09/15/17 1310 10/27/17 1327 11/24/17 1037  BILITOT 0.57 0.4 0.5 0.4  AST 12 15 12 12   ALT 8 6 7 7   ALKPHOS 58 68 62 62  PROT 6.3* 6.2* 6.8 6.4  ALBUMIN 3.4* 3.1* 3.1* 2.9*    TUMOR MARKERS: No results for input(s): AFPTM, CEA, CA199, CHROMGRNA in the last 8760 hours.  Assessment and Plan:  Mrs. Lowenstein has a right lower extremity ulcer and history of left leg ulcers. A limited venous insufficiency evaluation demonstrates no superficial vein valvular incompetence. There is chronic partially occlusive thrombus throughout the femoral veins bilaterally indicating a prior history of DVT. Once ulcers have healed, graded compression stockings will be helpful. No intervention for venous disease is indicated at this time. Arterial occlusive disease is also contributing to her lower extremity ulceration.  Thank you for this interesting consult.  I greatly enjoyed meeting LORRA FREEMAN and look forward to participating in their care.  A copy of this report was sent to the requesting provider on this date.  Electronically Signed: Codie Hainer, ART A 12/05/2017, 9:49 AM   I spent a total of  40 Minutes   in face to face in clinical consultation, greater than 50% of which was counseling/coordinating care for lower extremity venous insufficiency.

## 2017-12-11 DIAGNOSIS — L97312 Non-pressure chronic ulcer of right ankle with fat layer exposed: Secondary | ICD-10-CM | POA: Diagnosis not present

## 2017-12-18 ENCOUNTER — Encounter (HOSPITAL_BASED_OUTPATIENT_CLINIC_OR_DEPARTMENT_OTHER): Payer: Medicare Other | Attending: Internal Medicine

## 2017-12-18 ENCOUNTER — Other Ambulatory Visit: Payer: Self-pay | Admitting: Hematology

## 2017-12-18 DIAGNOSIS — L97312 Non-pressure chronic ulcer of right ankle with fat layer exposed: Secondary | ICD-10-CM | POA: Insufficient documentation

## 2017-12-18 DIAGNOSIS — C9000 Multiple myeloma not having achieved remission: Secondary | ICD-10-CM

## 2017-12-18 DIAGNOSIS — L97822 Non-pressure chronic ulcer of other part of left lower leg with fat layer exposed: Secondary | ICD-10-CM | POA: Diagnosis not present

## 2017-12-18 MED ORDER — MORPHINE SULFATE ER 30 MG PO TBCR: 30.0000 mg | EXTENDED_RELEASE_TABLET | Freq: Two times a day (BID) | ORAL | 0 refills | Status: DC

## 2017-12-19 NOTE — Progress Notes (Signed)
[facility]  [phone]) [phone] [phone]) [phone]  Clinic Follow Up Note   Patient Care Team: [name], MD as PCP - General (Family Medicine)   Date of Service: 12/22/2017  CHIEF COMPLAINTS:  Follow up Multiple Myeloma      Multiple myeloma ([place])   10/18/2016 - 10/26/2016 Hospital Admission    Pt was admitted for symptomatic hypercalcemia, AKI, bone marrow biopsy done during hospital stay, and she received Aredia       10/19/2016 Tumor Marker    SPEP showed M-protein 0.4g/dl, serum lambda light chain 5362, ratio 0.00      10/25/2016 Bone Marrow Biopsy     Overall, the marrow is hypercellular with increased lambda-restricted plasma cells (62% aspirate, 90% CD138). There is partial expression of CD20, but no monoclonal B-cell population is detected by flow cytometry.       10/25/2016 Initial Diagnosis    Multiple myeloma ([place])      10/25/2016 Miscellaneous    Cytogenetics and FISH showed loss of 13q, which is intermediate risk for MM       11/10/2016 -  Chemotherapy    Revlimid 10 mg (increased to 51m on cycle 2) daily, 3 weeks on, and one week off, dexamethasone 20 mg weekly, was off from early June to mid July 2018 due to her right hip fracture and hospitalization.  Changed to maintenence Revlimid 145mand stopped dexamethasone starting next cycle, 06/10/17         01/02/2017 Imaging     DG Hip Unilat w or w/o Pelvis IMPRESSION 1. Degenerative changes lumbar spine and both hips. No evidence of fracture or dislocation.  2. Diffuse osteopenia. Multiple tiny lucencies are noted throughout both proximal femurs. These may be secondary to the patient's known multiple myeloma.      01/18/2017 Imaging     CT hip right wo contrast  IMPRESSION: Findings consistent with a pathologic fracture of the right femoral neck. A large lytic lesion in the superior aspect of the lateral femoral head is identified.  Possible nondisplaced fracture of the  right inferior pubic ramus.  Diffuse myeloma.      01/18/2017 - 01/23/2017 Hospital Admission    She present to the ED with complaints of severe right hip pain. she recieved a right hip hemiarthroplasty with Dr. MuPercell Miller     HISTORY OF PRESENTING ILLNESS (10/20/2016):  [age]ear old African-American female, with past medical history of hypertension, chronic back pain, [name] stage III, who presents with altered mental status. She was admitted for hypercalcemia. I was called for consult to rule out multiple myeloma.   The patient is joined by family today. They help to report the patient's history. The patient has been lethargic and confused for the past week. She has issues with mobility at this time. She is constipated.   The family reports the patient fell last October, and her PCP suspected she broke her back. The patient received a cortisone injection in January, which was followed by onset of pain and decreased mobility. The patient was subsequently admitted [name], where the family reports they discovered she was being given someone else's medicine. This led to the patient's confused mental status and difficulty with ambulation. The patient fell again while in [name], and she was brought back to home by her family. She has 24/7 care at home. In the ED, she was found to have calcium more than 15, she was admitted for further management. She received calcitonin, her  calcium came down to 12.7 today. Her SPEP was positive for M protein-0.4 g/dl, possible represent a monoclonal gammopathy. Her 24-hour urine light chains are pending. Her PTH was normal at 20, PTH-rp is pending.   CURRENT THERAPY:  1. RD (Revlimid 10 mg daily, increased to 71m from cycle 3, 3 weeks on, one week off, and weekly dexamethasone 282m, started on 11/10/2016. 2.Change to maintenance Revlimid 1061maily for 3 weeks on and 1 week off, and stopped dexamethasone started on 06/10/17 3. Aredia 69m7mvery 4 weeks, started on 10/24/2016 for hypercalcemia. Switch to Xgeva injections every 4 weeks on 01/02/17  INTERIM HISTORY: ChriJANEVA PEASTERa 84 y52. female here for follow up accompanied by her daughter today. She is doing well overall. She will complete this round of Revlimid in the next couple of days.   She will meet with a vascular surgeon at [facility] to possibly have a stent placed due to blockage to her extremities. She has a wound to her RLE x 3-4 months that is gradually improving that she will have assessed by the vascular surgeon as well. She takes Lasix once daily since October 2018 for the LE swelling.   On review of systems, she denies fever, cough, and any other symptoms.    MEDICAL HISTORY:  Past Medical History:  Diagnosis Date  . [facility] by Dr. FengBurr MedicoeslCarrolltong" (10/09/2017)  . Chronic kidney disease (CKD), stage III (moderate) (HCC)   . Depression    Risperdal "twice/day" (09/23/2016)  . Glaucoma   . Hiatal hernia   . Hypertension   . Intractable back pain    /notes 09/23/2016  . Obesity   . Osteoarthritis    "all over" (10/09/2017)  . Osteoporosis    vertebral fracture in 2012  . Peripheral vascular disease (HCC)[place]. Upper GI bleed 04/2004   secondary to Mallory-Weiss tear/notes 12/28/2010  . Vertigo     SURGICAL HISTORY: Past Surgical History:  Procedure Laterality Date  . ABDOMINAL HYSTERECTOMY  1974   partial  . APPENDECTOMY     as a child/notes 12/28/2010  . BACK SURGERY    . BREAST BIOPSY Left 1999   /notArchie Endo5/2012  . CATARACT EXTRACTION W/ INTRAOCULAR LENS  IMPLANT, BILATERAL Bilateral 2000s  . ESOPHAGOGASTRODUODENOSCOPY  04/2004   /notArchie Endo5/2012  . FIXATION KYPHOPLASTY THORACIC SPINE  05/2011   T10/notes 05/25/2011  . FRACTURE SURGERY    . HIP ARTHROPLASTY Right 01/19/2017   Procedure: RIGHT HIP HEMIARTHROPLASTY;  Surgeon: MurpRenette Butters;  [facility];  Laterality: Right;  .  LAPAROSCOPIC CHOLECYSTECTOMY  10/2007   /notArchie Endo/2012  . LOWER EXTREMITY ANGIOGRAPHY N/A 10/09/2017   Procedure: LOWER EXTREMITY ANGIOGRAPHY;  Surgeon: BerrLorretta Harp;  [facility];  Service: Cardiovascular;  Laterality: N/A;  bilateral  . LOWER EXTREMITY ANGIOGRAPHY  11/21/2017   Procedure: Lower Extremity Angiography;  Surgeon: GanjAdrian Prows;  [facility];  Service: Cardiovascular;;  . PERIPHERAL VASCULAR BALLOON ANGIOPLASTY  10/09/2017   Attempted PTA /notArchie Endo5/2019  . PERIPHERAL VASCULAR BALLOON ANGIOPLASTY  10/09/2017   Procedure: PERIPHERAL VASCULAR BALLOON ANGIOPLASTY;  Surgeon: BerrLorretta Harp;  [facility];  Service: Cardiovascular;;  Attempted PTA     SOCIAL HISTORY: Social History   Socioeconomic History  . Marital status: Widowed    Spouse name: Not on file  . Number  of children: Not on file  . Years of education: Not on file  . Highest education level: Not on file  Occupational History  . Not on file  Social Needs  . Financial resource strain: Not on file  . Food insecurity:    Worry: Not on file    Inability: Not on file  . Transportation needs:    Medical: Not on file    Non-medical: Not on file  Tobacco Use  . Smoking status: Never Smoker  . Smokeless tobacco: Never Used  Substance and Sexual Activity  . Alcohol use: Yes    Comment: 10/09/2017  "glass of wine a few times/year"  . Drug use: No  . Sexual activity: Never  Lifestyle  . Physical activity:    Days per week: Not on file    Minutes per session: Not on file  . Stress: Not on file  Relationships  . Social connections:    Talks on phone: Not on file    Gets together: Not on file    Attends religious service: Not on file    Active member of club or organization: Not on file    Attends meetings of clubs or organizations: Not on file    Relationship status: Not on file  . Intimate partner violence:    Fear of current or ex partner:  Not on file    Emotionally abused: Not on file    Physically abused: Not on file    Forced sexual activity: Not on file  Other Topics Concern  . Not on file  Social History Narrative  . Not on file    FAMILY HISTORY: Family History  Problem Relation Age of Onset  . Hypertension Mother   . Osteoporosis Mother   . CVA Mother   . Cancer Neg Hx     ALLERGIES:  is allergic to penicillins; alendronate; and sulfonamide derivatives.  MEDICATIONS:  Current Outpatient Medications  Medication Sig Dispense Refill  . aspirin 81 MG chewable tablet Chew 81 mg by mouth daily.    Marland Kitchen atorvastatin (LIPITOR) 10 MG tablet Take 10 mg by mouth daily.    . Calcium Carbonate (CALCIUM 600 PO) Take 600 mg by mouth daily.     . feeding supplement, ENSURE ENLIVE, (ENSURE ENLIVE) LIQD Take 237 mLs by mouth 2 (two) times daily between meals. (Patient taking differently: Take 237 mLs by mouth daily. ) 237 mL 12  . furosemide (LASIX) 20 MG tablet Take 20 mg by mouth daily.    . irbesartan (AVAPRO) 75 MG tablet Take 75 mg by mouth daily after supper.    Marland Kitchen lenalidomide (REVLIMID) 10 MG capsule Take 1 capsule (10 mg total) by mouth daily. Take Revlimid 10 mg by mouth daily for  21 days ;  Off  7 days. 21 capsule 0  . magnesium oxide (MAG-OX) 400 (241.3 Mg) MG tablet Take 1 tablet (400 mg total) by mouth daily. 7 tablet 0  . meclizine (ANTIVERT) 25 MG tablet TAKE ONE TABLET BY MOUTH THREE TIMES DAILY AS NEEDED (Patient taking differently: Take 25 mg by mouth 3 (three) times daily as needed for dizziness. ) 90 tablet 0  . megestrol (MEGACE) 40 MG tablet Take 1 tablet (40 mg total) by mouth daily.    . mirtazapine (REMERON) 15 MG tablet Take 1 tablet (15 mg total) by mouth at bedtime. 90 tablet 1  . morphine ([name]) 30 MG 12 hr tablet Take 1 tablet (30 mg total) by mouth every 12 (  twelve) hours. 60 tablet 0  . ondansetron (ZOFRAN ODT) 4 MG disintegrating tablet Take 1-2 tablets (4-8 mg total) by mouth every 8  (eight) hours as needed for nausea or vomiting. (Patient taking differently: Take 4 mg by mouth daily. ) 30 tablet 2  . Oxycodone HCl 10 MG TABS Take 1 tablet (10 mg total) by mouth every 6 (six) hours as needed. FOR MODERATE OR SEVERE PAIN (Patient taking differently: Take 10 mg by mouth See admin instructions. Take 10 mg by mouth daily at 1700) 90 tablet 0  . RESTASIS 0.05 % ophthalmic emulsion Place 1 drop into both eyes daily.     . risperiDONE (RISPERDAL) 0.5 MG tablet Take 0.5 mg by mouth 2 (two) times daily.    Marland Kitchen SANTYL ointment Apply 1 application topically every other day.  0  . sennosides-docusate sodium (SENOKOT-S) 8.6-50 MG tablet Take 1 tablet by mouth 2 (two) times daily.     Alveda Reasons 2.5 MG TABS tablet Take 2.5 mg by mouth 2 (two) times daily with a meal.  2   No current facility-administered medications for this visit.    Facility-Administered Medications Ordered in Other Visits  Medication Dose Route Frequency Provider Last Rate Last Dose  . denosumab (XGEVA) injection 120 mg  120 mg Subcutaneous Once Truitt Merle, MD        REVIEW OF SYSTEMS:  Constitutional: Denies fevers, chills or abnormal night sweats  Eyes: Denies blurriness of vision, double vision or watery eyes Ears, nose, mouth, throat, and face: Denies mucositis or sore throat  Respiratory: Denies cough, dyspnea or wheezes Cardiovascular: Denies palpitation, chest discomfort (+) mild lower extremity swelling and right arm swelling Gastrointestinal:  Denies heartburn Skin: Denies abnormal skin rashes (+) wound on right ankle  Lymphatics: Denies new lymphadenopathy or easy bruising  Neurological:Denies numbness, tingling or new weaknesses  Behavioral/Psych: Mood is stable, no new changes  Musculoskeletal: (+) left lower back and improved right side pain, managed with Morphine  All other systems were reviewed with the patient and are negative.  PHYSICAL EXAMINATION:  ECOG PERFORMANCE STATUS: 3 Vitals:   12/22/17  1058  BP: 126/62  Pulse: 66  Resp: 18  Temp: 98.2 F (36.8 C)  TempSrc: Oral  SpO2: 100%  Weight: 138 lb 9.6 oz (62.9 kg)  Height: 4' 11"  (1.499 m)     GENERAL:alert, no distress and comfortable, sitting in wheelchair  SKIN: skin color, texture, turgor are normal, no rashes or significant lesions EYES: normal, conjunctiva are pink and non-injected, sclera clear OROPHARYNX:no exudate, no erythema and lips, buccal mucosa, and tongue normal  NECK: supple, thyroid normal size, non-tender, without nodularity LYMPH:  no palpable lymphadenopathy in the cervical, axillary or inguinal LUNGS: clear to auscultation and percussion with normal breathing effort HEART: regular rate & rhythm and no murmurs and no lower extremity edema ABDOMEN:abdomen soft, non-tender and normal bowel sounds Musculoskeletal:no cyanosis of digits and no clubbing. BLE wrapped with gauze, per patient, she has small wound on BLE.  PSYCH: alert & oriented x 3 with fluent speech NEURO: no focal motor/sensory deficits  LABORATORY DATA:  I have reviewed the data as listed CBC Latest Ref Rng & Units 12/22/2017 11/24/2017 10/27/2017  WBC 3.9 - 10.3 K/uL 2.7(L) 3.7(L) 4.7  Hemoglobin 11.6 - 15.9 g/dL 9.5(L) 8.7(L) 10.2(L)  Hematocrit 34.8 - 46.6 % 28.4(L) 26.6(L) 30.9(L)  Platelets 145 - 400 K/uL 63(L) 137(L) 166   CMP Latest Ref Rng & Units 12/22/2017 11/24/2017 10/27/2017  Glucose 70 -  140 mg/dL 108 143(H) 110  BUN 7 - 26 mg/dL 17 18 15   Creatinine 0.60 - 1.10 mg/dL 1.34(H) 1.43(H) 1.09  Sodium 136 - 145 mmol/L 137 140 140  Potassium 3.5 - 5.1 mmol/L 3.9 3.8 3.7  Chloride 98 - 109 mmol/L 106 108 105  CO2 22 - 29 mmol/L 27 24 27   Calcium 8.4 - 10.4 mg/dL 8.2(L) 8.0(L) 8.7  Total Protein 6.4 - 8.3 g/dL 6.3(L) 6.4 6.8  Total Bilirubin 0.2 - 1.2 mg/dL 0.6 0.4 0.5  Alkaline Phos 40 - 150 U/L 55 62 62  AST 5 - 34 U/L 14 12 12   ALT 0 - 55 U/L 11 7 7     MM LAB: M-protein (g/dl) 10/19/2016: 0.4 11/01/2016: Not Det 12/05/16:  Not Det 01/02/17: Not det 02/27/17: Not det 03/24/17: not det.  04/24/17: not det.  05/19/17: not det. 06/16/17:: not det 07/21/17: not det 08/18/17: not det 09/15/17: not det 10/27/17: PENDING  Serum kappa, lamda light chains and ratio (mg/L) 10/21/2016: 14.8, 5362, 0.00 11/01/2016: 1.49, 10.88, 0.14 12/05/2016: 2.16, 19.31, 0.11 01/02/17: 3.01, 10.90, 0.28 02/27/17: 1.49, 20.64, 0.07 03/24/17: 2.19, 3.90, 0.56 04/24/17: 2.58, 4.02, 0.64 05/19/17: 2.22, 3.55, 0.63 06/16/17:: 4.18, 5.55, 0.75 07/21/17: 3.86, 5.10, 0.76 08/18/17: 2.90, 3.58, 0.81 09/15/17: 5.53. 5.47, 1.01 10/27/17: PENDING  24 hour urine (total protein, free lambda, ration) 10/21/2016: 1463, 1570, 0.06   PATHOLOGY  Diagnosis 01/19/2017 Femoral head, fracture, Right - FRACTURE, SEE COMMENT. Microscopic Comment There are scattered plasma cells seen with CD138, predominately in the area of fracture. Light chain in situ hybridization is non-contributory, likely due to heavy decalcification. Overt plasma cell neoplasm is not appreciated.  Interpretation 10/25/2016 Bone Marrow Flow Cytometry - NO MONOCLONAL B-CELL OR PHENOTYPICALLY ABERRANT T-CELL POPULATION IDENTIFIED.  Diagnosis 10/25/2016 Bone Marrow, Aspirate,Biopsy, and Clot - PLASMA CELL MYELOMA. - SEE COMMENT. PERIPHERAL BLOOD: - NORMOCYTIC ANEMIA. Diagnosis Note Overall, the marrow is hypercellular with increased lambda-restricted plasma cells (62% aspirate, 90% CD138). There is partial expression of CD20, but no monoclonal B-cell population is detected by flow cytometry.     RADIOGRAPHIC STUDIES: I have personally reviewed the radiological images as listed and agreed with the findings in the report. US Venous Img Lower Bilateral  Result Date: 12/05/2017 CLINICAL DATA:  Nonhealing ulcers of the lower extremities EXAM: BILATERAL LOWER EXTREMITY VENOUS DUPLEX ULTRASOUND TECHNIQUE: Gray-scale sonography with graded compression, as well as color Doppler and duplex  ultrasound, were performed to evaluate the deep and superficial veins of both lower extremities. Spectral Doppler was utilized to evaluate flow at rest and with distal augmentation maneuvers. A complete superficial venous insufficiency exam was performed in the upright standing. I personally performed the technical portion of the exam. COMPARISON:  None. FINDINGS: RIGHT Deep Venous System: The right common femoral vein is compressible. There is nonocclusive thrombus throughout the right femoral vein. The vein is not dilated. There is some recanalization with serpiginous flow through the chronic appearing thrombus. Augmentation was deferred. The popliteal vein is grossly patent and compressible. RIGHT Superficial Venous System: SFJ: No reflux GSV Prox Thigh: No reflux GSV Mid Thigh: No reflux GSV Lower Thigh: No reflux GSV Prox Calf: Not performed due to dressings GSV Mid Calf: Not performed due to dressings GSV Distal Calf: Not performed due to dressings SPJ: Not performed due to dressings SSV Prox: Not performed due to dressings SSV Mid: Not performed due to dressings SSV Distal: Not performed due to dressings Other: Examination of the superficial venous system is limited due to  dressings over the legs. LEFT Deep Venous System: The left common femoral vein is compressible. There is chronic appearing thrombus along the walls of the mid and proximal left femoral vein. There is nearly occlusive thrombus in the distal left femoral vein with recanalization of flow. The left popliteal vein is poorly visualized but grossly patent. Augmentation was deferred. LEFT Superficial Venous System: SFJ: No reflux GSV Prox Thigh: No reflux GSV Mid Thigh: No reflux GSV Lower Thigh: No reflux GSV Prox Calf: Not performed due to dressings GSV Mid Calf: Not performed due to dressings GSV Distal Calf: Not performed due to dressings SPJ: Not performed due to dressings SSV Prox: Not performed due to dressings SSV Mid: Not performed due to  dressings SSV Distal: Not performed due to dressings Other: Examination of the superficial venous system was somewhat limited due to dressings over the legs. IMPRESSION: Examination of the superficial venous system was somewhat limited. There is no evidence of venous insufficiency in the greater saphenous veins. Lesser saphenous veins were not evaluated due to lower extremity dressings in place. There is chronic partially occlusive DVT throughout the bilateral femoral veins as described. There is recanalization of flow throughout the thrombus. Electronically Signed   By: Marybelle Killings M.D.   On: 12/05/2017 09:45   [place] [facility] Eval [facility]mt  Result Date: 12/05/2017 Please refer to "Notes" to see consult details.    DG Hip Unilateral 01/02/17 IMPRESSION: 1. Degenerative changes lumbar spine and both hips. No evidence of fracture or dislocation. 2. Diffuse osteopenia. Multiple tiny lucencies are noted throughout both proximal femurs. These may be secondary to the patient's known multiple myeloma.   ASSESSMENT & PLAN:  84 y.o.woman with HTN and CKD, presented with hypercalcemia, worsening anemia since 06/2016, s/p multiple fall, multiple hospitalization recently for back pain , CT scan showedT2 vertebral body compression fracture with mild height loss.  1. 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 -We previously discussed treatment options.  due to advanced age, multiple comorbidities, very limited performance status, I do not believe she is a candidate for bone marrow transplant -she was  started in induction RD. Start on dexamethasone 69m weekly and Revlimid 144mdaily; 3 weeks on, 1 week off, at adjusted for her renal function. She has been tolerating well, dose increased to 1558mrom cycle 3 -due to her Intermediated risk disease, she may need Velcade. Due to her advanced age and comorbidities, I am concerned about her tolerance to Velcade. I will try RD for a few cycles, if she does not have good response, I will add Velcade.  -The goal of therapy is palliative, for disease control, improve her quality of life and prolong life. - Repeat multiple myeloma panel every 8 weeks  - she has underwent right hip surgery with Dr. MurPercell Miller June 2018, she was off Revlimid for about 6 weeks. - she has been tolerating Revlimid and dexamethasone well -Her M-protein is negative and she is responding well to treatment. Will continue Revlimid.  -Given her excellent response to treatment, I have changed her to maintenance therapy. -she is currently on maintenence Revlimid 10 mg daily for 3 weeks,  and one-week off. She will continue this until disease progresses.  She understands her multiple myeloma is not curable and the goal of therapy is disease control.  -Her M protein has been undetectable, no disease progression so far. -Labs today, 12/22/2017 show PLT at 63K, her thrombocytopenia is new, creatinine 1.34 today, which has been elevated the past 1 months. I advised the patient and her daughter to stop the last couple days of Revlimid for this cycle. I also recommended that the patient come into the clinic for repeat labs next Monday.  -We will postpone her next cycle of Revlimid until her blood counts recovers -If her renal function does not recover well, I may reduce her Revlimid dose further   2. Anemia  -secondary to MM and Revlimid. -She received blood transfusion in hospital -will monitor CBC closely -consider blood transfusion if Hb<8.0 or symptomatic anemia with Hb 8-9 -Her anemia has  slightly improved since he came off Revlimid, hemoglobin 10.3 (02/27/17) -Anemia is 9.4 on (04/24/17), does not need blood  transfusion. We will continue to monitor -HG slightly improved but overall stable   3. History of hypercalcemia/hypocalcemia  -She has received one dose of Aredia, hypercalcemia has nearly resolved now. We'll continue every 4 weeks for multiple myeloma, or more frequent if her hypercalcemia gets worse. -Her reached normal limits previously  - I previously encouraged her to take vitD, 800 unit daily  -Her Calcium was 8.0 (04/24/17) so I suggested she takes some Vitamin D with Calcium 800 mg once a day, I'll increase her calcium to twice daily -calcium stable at 8.1 (05/19/17), no Xgeva was given that day -Calcium dropped to 7.4 on (06/16/17), No injection was given that day. -I strongly encouraged her to restart calcium with vitamin D BID and increase dairy intake previously. We'll resume Xgeva injection next month if her calcium level improves -Her Ca is 8.4 on (09/15/17) She can get Xgeva, but we will see her back in 6 weeks instead of 4 to allow her Ca to get higher. She is compliant with her CA supplement and takes it twice a day.  -Ca 8.7 today (10/27/17) continue with Xgeva today -Ca 8.2 today (12/22/17)   4. Chronic CKD, stage II -previously much improved, EGFR 50-60 -Avoid nephrotoxic medicine or contrast  --Cr at 1.34 today, 12/22/2017. Patient notes that she takes Lasix once daily since October 2018 for her BLE swelling.  -I suggested to the patient to hold Lasix at this time due to the creatinine levels. I suggested that the patient hold the Lasix for 10 days at most, but I also encouraged the patient's daughter to keep a watch on the patient's LE swelling and if the swelling is increasing, then it would be prudent to start Lasix again.   5. Severe back pain and right hip pain from pathologic fracture -T2 compression fracture in the right hip is logical fracture  secondary to her multiple myeloma -She will continue [name] every 12 hours and oxycodone as needed -we discussed taking laxative due to increased oxycodone prescription -We discussed using walker and avoid weight bearing on her right hip -Will give referral to orthopedic surgery for further evaluation  -She is fine to take tylenol, but she should avoid advil due to her MM.  -Pain is controlled with Morphine and oxycodone BID, refilled on (09/15/17)  6. HTN -controlled with medication -managed by PCP -I will send a message to Dr. Jacelyn Grip and Dr. Einar Gip to determine if there needs to be  a HTN medication change in the interim.  7. Osteoporosis and fractures -Takes Boniva, which has been switched to Aredia  -Due to her poor IV access, I'll switch Aredia to Xgeva every 4 weeks -Started Xgeva 01/02/17, and continue every 4 weeks, tolerating well. She will continue for the next 2 years  8. Goal of care discussion  -We previously discussed the incurable nature of her MM, and the overall poor prognosis, especially if she does not have good response to chemotherapy or progress on chemo -The patient understands the goal of care is palliative. -I previously recommended DNR/DNI, she will think about it   9. Anorexia  - I have previously encouraged her to eat snacks in between her meals -I previously recommended her to try mirtazapine 7.45m HS, may increase dose in future if needed  -Remeron is working well and her appetite has improved lately.  -Increased mirtazapine to 165m(04/24/17), she is also on megace  -Appetite has improved.   11. B/L ankle wound, right LE PVD -She has had a wound in the right ankle for 2-3 months, follow-up of his wound clinic -She recently found to have severe peripheral vascular disease in the right lower extremity, revascularization with stent was not successful.   -Daughter notes that the patient is to have [facility]  [facility], 12/[facility].  -I called Dr. AdAndree Elkoday via phone call regarding the patient recent decreased PLT levels from today, 12/22/2017.  Patient is scheduled to have stent placement, her current platelet count is adequate for procedure.  I will repeat her CBC on Monday 5/13 before her procedure the next day    Plan -No injection today due to her renal issue and hypocalcemia -Hold the remainder of revlimid cycle until she recovers -Repeat CBC on May 13 -Lab, follow-up and injection on May 20. Will start next cycle Revlimid after her visit on May 20, may reduce her dose if her Cr does not im[prove     All questions were answered. The patient knows to call the clinic with any problems, questions or concerns.  I spent 20 minutes counseling the patient face to face. The total time spent in the appointment was 30 minutes and more than 50% was on counseling.  This document serves as a record of services personally performed by YaTruitt MerleMD. It was created on her behalf by SoSteva Coldera trained medical scribe. The creation of this record is based on the scribe's personal observations and the provider's statements to them.   I have reviewed the above documentation for accuracy and completeness, and I agree with the above.   YaTruitt MerleMD 12/22/2017

## 2017-12-21 ENCOUNTER — Other Ambulatory Visit: Payer: Self-pay

## 2017-12-21 ENCOUNTER — Telehealth: Payer: Self-pay

## 2017-12-21 DIAGNOSIS — C9000 Multiple myeloma not having achieved remission: Secondary | ICD-10-CM

## 2017-12-21 MED ORDER — LENALIDOMIDE 10 MG PO CAPS: 10.0000 mg | ORAL_CAPSULE | Freq: Every day | ORAL | 0 refills | Status: DC

## 2017-12-21 NOTE — Telephone Encounter (Signed)
Faxed refill for Revlimid 10 mg to [facility] at fax [phone] at 1217 confirmation received.

## 2017-12-22 ENCOUNTER — Telehealth: Payer: Self-pay | Admitting: Hematology

## 2017-12-22 ENCOUNTER — Inpatient Hospital Stay: Payer: Medicare Other

## 2017-12-22 ENCOUNTER — Inpatient Hospital Stay (HOSPITAL_BASED_OUTPATIENT_CLINIC_OR_DEPARTMENT_OTHER): Payer: Medicare Other | Admitting: Hematology

## 2017-12-22 ENCOUNTER — Telehealth: Payer: Self-pay | Admitting: *Deleted

## 2017-12-22 ENCOUNTER — Encounter: Payer: Self-pay | Admitting: Hematology

## 2017-12-22 ENCOUNTER — Inpatient Hospital Stay: Payer: Medicare Other | Attending: Hematology

## 2017-12-22 VITALS — BP 126/62 | HR 66 | Temp 98.2°F | Resp 18 | Ht 59.0 in | Wt 138.6 lb

## 2017-12-22 DIAGNOSIS — Z7901 Long term (current) use of anticoagulants: Secondary | ICD-10-CM | POA: Insufficient documentation

## 2017-12-22 DIAGNOSIS — G549 Nerve root and plexus disorder, unspecified: Secondary | ICD-10-CM | POA: Insufficient documentation

## 2017-12-22 DIAGNOSIS — M1990 Unspecified osteoarthritis, unspecified site: Secondary | ICD-10-CM

## 2017-12-22 DIAGNOSIS — E669 Obesity, unspecified: Secondary | ICD-10-CM | POA: Insufficient documentation

## 2017-12-22 DIAGNOSIS — N183 Chronic kidney disease, stage 3 (moderate): Secondary | ICD-10-CM

## 2017-12-22 DIAGNOSIS — K449 Diaphragmatic hernia without obstruction or gangrene: Secondary | ICD-10-CM | POA: Insufficient documentation

## 2017-12-22 DIAGNOSIS — C9000 Multiple myeloma not having achieved remission: Secondary | ICD-10-CM | POA: Insufficient documentation

## 2017-12-22 DIAGNOSIS — Z79899 Other long term (current) drug therapy: Secondary | ICD-10-CM | POA: Insufficient documentation

## 2017-12-22 DIAGNOSIS — I129 Hypertensive chronic kidney disease with stage 1 through stage 4 chronic kidney disease, or unspecified chronic kidney disease: Secondary | ICD-10-CM | POA: Diagnosis not present

## 2017-12-22 DIAGNOSIS — Z7982 Long term (current) use of aspirin: Secondary | ICD-10-CM | POA: Diagnosis not present

## 2017-12-22 DIAGNOSIS — M810 Age-related osteoporosis without current pathological fracture: Secondary | ICD-10-CM | POA: Diagnosis not present

## 2017-12-22 DIAGNOSIS — F329 Major depressive disorder, single episode, unspecified: Secondary | ICD-10-CM

## 2017-12-22 DIAGNOSIS — Z9221 Personal history of antineoplastic chemotherapy: Secondary | ICD-10-CM | POA: Diagnosis not present

## 2017-12-22 DIAGNOSIS — I739 Peripheral vascular disease, unspecified: Secondary | ICD-10-CM

## 2017-12-22 DIAGNOSIS — R630 Anorexia: Secondary | ICD-10-CM | POA: Diagnosis not present

## 2017-12-22 DIAGNOSIS — K5900 Constipation, unspecified: Secondary | ICD-10-CM

## 2017-12-22 DIAGNOSIS — I10 Essential (primary) hypertension: Secondary | ICD-10-CM

## 2017-12-22 DIAGNOSIS — Z90710 Acquired absence of both cervix and uterus: Secondary | ICD-10-CM

## 2017-12-22 LAB — CBC WITH DIFFERENTIAL/PLATELET
BASOS ABS: 0 10*3/uL (ref 0.0–0.1)
Basophils Relative: 0 %
Eosinophils Absolute: 0.3 10*3/uL (ref 0.0–0.5)
Eosinophils Relative: 12 %
HEMATOCRIT: 28.4 % — AB (ref 34.8–46.6)
Hemoglobin: 9.5 g/dL — ABNORMAL LOW (ref 11.6–15.9)
LYMPHS ABS: 1.4 10*3/uL (ref 0.9–3.3)
LYMPHS PCT: 52 %
MCH: 32.6 pg (ref 25.1–34.0)
MCHC: 33.5 g/dL (ref 31.5–36.0)
MCV: 97.6 fL (ref 79.5–101.0)
MONO ABS: 0.2 10*3/uL (ref 0.1–0.9)
Monocytes Relative: 7 %
NEUTROS ABS: 0.8 10*3/uL — AB (ref 1.5–6.5)
Neutrophils Relative %: 29 %
Platelets: 63 10*3/uL — ABNORMAL LOW (ref 145–400)
RBC: 2.91 MIL/uL — ABNORMAL LOW (ref 3.70–5.45)
RDW: 16.5 % — ABNORMAL HIGH (ref 11.2–14.5)
WBC: 2.7 10*3/uL — ABNORMAL LOW (ref 3.9–10.3)

## 2017-12-22 LAB — COMPREHENSIVE METABOLIC PANEL
ALT: 11 U/L (ref 0–55)
AST: 14 U/L (ref 5–34)
Albumin: 3.2 g/dL — ABNORMAL LOW (ref 3.5–5.0)
Alkaline Phosphatase: 55 U/L (ref 40–150)
Anion gap: 4 (ref 3–11)
BUN: 17 mg/dL (ref 7–26)
CALCIUM: 8.2 mg/dL — AB (ref 8.4–10.4)
CHLORIDE: 106 mmol/L (ref 98–109)
CO2: 27 mmol/L (ref 22–29)
Creatinine, Ser: 1.34 mg/dL — ABNORMAL HIGH (ref 0.60–1.10)
GFR, EST AFRICAN AMERICAN: 41 mL/min — AB (ref >60)
GFR, EST NON AFRICAN AMERICAN: 35 mL/min — AB (ref >60)
Glucose, Bld: 108 mg/dL (ref 70–140)
Potassium: 3.9 mmol/L (ref 3.5–5.1)
Sodium: 137 mmol/L (ref 136–145)
TOTAL PROTEIN: 6.3 g/dL — AB (ref 6.4–8.3)
Total Bilirubin: 0.6 mg/dL (ref 0.2–1.2)

## 2017-12-22 MED ORDER — DENOSUMAB 120 MG/1.7ML ~~LOC~~ SOLN: 120.0000 mg | Freq: Once | SUBCUTANEOUS | Status: AC

## 2017-12-22 MED ORDER — DENOSUMAB 120 MG/1.7ML ~~LOC~~ SOLN
SUBCUTANEOUS | Status: AC
  Filled 2017-12-22: qty 1.7

## 2017-12-22 NOTE — Telephone Encounter (Signed)
NO LOS 5/10

## 2017-12-22 NOTE — Telephone Encounter (Signed)
Faxed labs from today to Dr Andree Elk @ [phone] & called [phone] & informed of low platelet count & asked that he call Dr Burr Medico. Called Daughter Hassan Rowan & informed of need for labs on MOnday & L/F/u, & Inj on following Monday.  Message to schedule sent to Schedulers.

## 2017-12-25 ENCOUNTER — Telehealth: Payer: Self-pay | Admitting: Hematology

## 2017-12-25 ENCOUNTER — Inpatient Hospital Stay: Payer: Medicare Other

## 2017-12-25 ENCOUNTER — Other Ambulatory Visit: Payer: Self-pay

## 2017-12-25 DIAGNOSIS — C9000 Multiple myeloma not having achieved remission: Secondary | ICD-10-CM | POA: Diagnosis not present

## 2017-12-25 LAB — CBC WITH DIFFERENTIAL (CANCER CENTER ONLY)
BASOS ABS: 0 10*3/uL (ref 0.0–0.1)
Basophils Relative: 0 %
EOS ABS: 0.2 10*3/uL (ref 0.0–0.5)
EOS PCT: 7 %
HCT: 28.1 % — ABNORMAL LOW (ref 34.8–46.6)
Hemoglobin: 9.5 g/dL — ABNORMAL LOW (ref 11.6–15.9)
Lymphocytes Relative: 63 %
Lymphs Abs: 2.2 10*3/uL (ref 0.9–3.3)
MCH: 32.9 pg (ref 25.1–34.0)
MCHC: 33.8 g/dL (ref 31.5–36.0)
MCV: 97.2 fL (ref 79.5–101.0)
MONO ABS: 0.2 10*3/uL (ref 0.1–0.9)
Monocytes Relative: 6 %
Neutro Abs: 0.8 10*3/uL — ABNORMAL LOW (ref 1.5–6.5)
Neutrophils Relative %: 24 %
PLATELETS: 80 10*3/uL — AB (ref 145–400)
RBC: 2.89 MIL/uL — AB (ref 3.70–5.45)
RDW: 16.6 % — ABNORMAL HIGH (ref 11.2–14.5)
WBC Count: 3.5 10*3/uL — ABNORMAL LOW (ref 3.9–10.3)

## 2017-12-25 LAB — KAPPA/LAMBDA LIGHT CHAINS
KAPPA, LAMDA LIGHT CHAIN RATIO: 1.15 (ref 0.26–1.65)
Kappa free light chain: 77.9 mg/L — ABNORMAL HIGH (ref 3.3–19.4)
LAMDA FREE LIGHT CHAINS: 67.7 mg/L — AB (ref 5.7–26.3)

## 2017-12-25 NOTE — Telephone Encounter (Signed)
Scheduled appt per 5/10 sch msg - spoke with pt's daughter re appts

## 2017-12-26 LAB — MULTIPLE MYELOMA PANEL, SERUM
ALBUMIN SERPL ELPH-MCNC: 3 g/dL (ref 2.9–4.4)
ALPHA 1: 0.2 g/dL (ref 0.0–0.4)
ALPHA2 GLOB SERPL ELPH-MCNC: 0.6 g/dL (ref 0.4–1.0)
Albumin/Glob SerPl: 1 (ref 0.7–1.7)
B-Globulin SerPl Elph-Mcnc: 0.9 g/dL (ref 0.7–1.3)
GAMMA GLOB SERPL ELPH-MCNC: 1.4 g/dL (ref 0.4–1.8)
GLOBULIN, TOTAL: 3.1 g/dL (ref 2.2–3.9)
IGA: 506 mg/dL — AB (ref 64–422)
IGG (IMMUNOGLOBIN G), SERUM: 1388 mg/dL (ref 700–1600)
IgM (Immunoglobulin M), Srm: 21 mg/dL — ABNORMAL LOW (ref 26–217)
Total Protein ELP: 6.1 g/dL (ref 6.0–8.5)

## 2018-01-01 ENCOUNTER — Telehealth: Payer: Self-pay

## 2018-01-01 ENCOUNTER — Inpatient Hospital Stay: Payer: Medicare Other

## 2018-01-01 ENCOUNTER — Encounter: Payer: Self-pay | Admitting: Hematology

## 2018-01-01 ENCOUNTER — Inpatient Hospital Stay (HOSPITAL_BASED_OUTPATIENT_CLINIC_OR_DEPARTMENT_OTHER): Payer: Medicare Other | Admitting: Hematology

## 2018-01-01 ENCOUNTER — Other Ambulatory Visit: Payer: Self-pay

## 2018-01-01 VITALS — BP 137/63 | HR 70 | Temp 98.2°F | Resp 18 | Ht 59.0 in | Wt 145.2 lb

## 2018-01-01 DIAGNOSIS — K5900 Constipation, unspecified: Secondary | ICD-10-CM | POA: Diagnosis not present

## 2018-01-01 DIAGNOSIS — Z9221 Personal history of antineoplastic chemotherapy: Secondary | ICD-10-CM

## 2018-01-01 DIAGNOSIS — D630 Anemia in neoplastic disease: Secondary | ICD-10-CM

## 2018-01-01 DIAGNOSIS — R630 Anorexia: Secondary | ICD-10-CM

## 2018-01-01 DIAGNOSIS — Z7982 Long term (current) use of aspirin: Secondary | ICD-10-CM

## 2018-01-01 DIAGNOSIS — I739 Peripheral vascular disease, unspecified: Secondary | ICD-10-CM | POA: Diagnosis not present

## 2018-01-01 DIAGNOSIS — Z90710 Acquired absence of both cervix and uterus: Secondary | ICD-10-CM

## 2018-01-01 DIAGNOSIS — C9000 Multiple myeloma not having achieved remission: Secondary | ICD-10-CM

## 2018-01-01 DIAGNOSIS — I10 Essential (primary) hypertension: Secondary | ICD-10-CM

## 2018-01-01 DIAGNOSIS — M810 Age-related osteoporosis without current pathological fracture: Secondary | ICD-10-CM

## 2018-01-01 DIAGNOSIS — F329 Major depressive disorder, single episode, unspecified: Secondary | ICD-10-CM

## 2018-01-01 DIAGNOSIS — G549 Nerve root and plexus disorder, unspecified: Secondary | ICD-10-CM | POA: Diagnosis not present

## 2018-01-01 DIAGNOSIS — M8000XS Age-related osteoporosis with current pathological fracture, unspecified site, sequela: Secondary | ICD-10-CM

## 2018-01-01 DIAGNOSIS — E669 Obesity, unspecified: Secondary | ICD-10-CM | POA: Diagnosis not present

## 2018-01-01 DIAGNOSIS — M1990 Unspecified osteoarthritis, unspecified site: Secondary | ICD-10-CM | POA: Diagnosis not present

## 2018-01-01 DIAGNOSIS — I129 Hypertensive chronic kidney disease with stage 1 through stage 4 chronic kidney disease, or unspecified chronic kidney disease: Secondary | ICD-10-CM | POA: Diagnosis not present

## 2018-01-01 DIAGNOSIS — K449 Diaphragmatic hernia without obstruction or gangrene: Secondary | ICD-10-CM

## 2018-01-01 DIAGNOSIS — L97312 Non-pressure chronic ulcer of right ankle with fat layer exposed: Secondary | ICD-10-CM | POA: Diagnosis not present

## 2018-01-01 DIAGNOSIS — Z7901 Long term (current) use of anticoagulants: Secondary | ICD-10-CM

## 2018-01-01 DIAGNOSIS — N183 Chronic kidney disease, stage 3 (moderate): Secondary | ICD-10-CM

## 2018-01-01 DIAGNOSIS — Z79899 Other long term (current) drug therapy: Secondary | ICD-10-CM

## 2018-01-01 LAB — CBC WITH DIFFERENTIAL/PLATELET
Basophils Absolute: 0 10*3/uL (ref 0.0–0.1)
Basophils Relative: 1 %
Eosinophils Absolute: 0.1 10*3/uL (ref 0.0–0.5)
Eosinophils Relative: 2 %
HEMATOCRIT: 29.3 % — AB (ref 34.8–46.6)
HEMOGLOBIN: 9.7 g/dL — AB (ref 11.6–15.9)
LYMPHS ABS: 2 10*3/uL (ref 0.9–3.3)
LYMPHS PCT: 50 %
MCH: 32.6 pg (ref 25.1–34.0)
MCHC: 33.1 g/dL (ref 31.5–36.0)
MCV: 98.7 fL (ref 79.5–101.0)
MONOS PCT: 11 %
Monocytes Absolute: 0.5 10*3/uL (ref 0.1–0.9)
NEUTROS ABS: 1.5 10*3/uL (ref 1.5–6.5)
NEUTROS PCT: 36 %
Platelets: 161 10*3/uL (ref 145–400)
RBC: 2.97 MIL/uL — AB (ref 3.70–5.45)
RDW: 17.4 % — ABNORMAL HIGH (ref 11.2–14.5)
WBC: 4.1 10*3/uL (ref 3.9–10.3)

## 2018-01-01 LAB — COMPREHENSIVE METABOLIC PANEL
ALT: 15 U/L (ref 0–55)
ANION GAP: 3 (ref 3–11)
AST: 17 U/L (ref 5–34)
Albumin: 2.9 g/dL — ABNORMAL LOW (ref 3.5–5.0)
Alkaline Phosphatase: 58 U/L (ref 40–150)
BUN: 24 mg/dL (ref 7–26)
CHLORIDE: 109 mmol/L (ref 98–109)
CO2: 26 mmol/L (ref 22–29)
CREATININE: 1.14 mg/dL — AB (ref 0.60–1.10)
Calcium: 8.7 mg/dL (ref 8.4–10.4)
GFR, EST AFRICAN AMERICAN: 49 mL/min — AB (ref >60)
GFR, EST NON AFRICAN AMERICAN: 43 mL/min — AB (ref >60)
Glucose, Bld: 71 mg/dL (ref 70–140)
POTASSIUM: 4.6 mmol/L (ref 3.5–5.1)
Sodium: 138 mmol/L (ref 136–145)
Total Bilirubin: 0.7 mg/dL (ref 0.2–1.2)
Total Protein: 5.9 g/dL — ABNORMAL LOW (ref 6.4–8.3)

## 2018-01-01 MED ORDER — LENALIDOMIDE 5 MG PO CAPS: 5.0000 mg | ORAL_CAPSULE | Freq: Every day | ORAL | 1 refills | Status: DC

## 2018-01-01 MED ORDER — OXYCODONE HCL 10 MG PO TABS: 10.0000 mg | ORAL_TABLET | Freq: Four times a day (QID) | ORAL | 0 refills | Status: DC | PRN

## 2018-01-01 MED ORDER — DENOSUMAB 120 MG/1.7ML ~~LOC~~ SOLN
120.0000 mg | Freq: Once | SUBCUTANEOUS | Status: AC
  Administered 2018-01-01: 120 mg via SUBCUTANEOUS

## 2018-01-01 NOTE — Progress Notes (Signed)
[place]  Telephone:([phone]) [phone] [phone]) [phone]  Clinic Follow Up Note   Patient Care Team: [name], MD as PCP - General (Family Medicine)   Date of Service: 01/01/2018  CHIEF COMPLAINTS:  Follow up Multiple Myeloma      Multiple myeloma ([place])   10/18/2016 - 10/26/2016 Hospital Admission    Pt was admitted for symptomatic hypercalcemia, AKI, bone marrow biopsy done during hospital stay, and she received Aredia       10/19/2016 Tumor Marker    SPEP showed M-protein 0.4g/dl, serum lambda light chain 5362, ratio 0.00      10/25/2016 Bone Marrow Biopsy     Overall, the marrow is hypercellular with increased lambda-restricted plasma cells (62% aspirate, 90% CD138). There is partial expression of CD20, but no monoclonal B-cell population is detected by flow cytometry.       10/25/2016 Initial Diagnosis    Multiple myeloma (Combine)      10/25/2016 Miscellaneous    Cytogenetics and FISH showed loss of 13q, which is intermediate risk for MM       11/10/2016 -  Chemotherapy    Revlimid 10 mg (increased to 50m on cycle 2) daily, 3 weeks on, and one week off, dexamethasone 20 mg weekly, was off from early June to mid July 2018 due to her right hip fracture and hospitalization.  Changed to maintenence Revlimid 116mand stopped dexamethasone starting next cycle, 06/10/17         01/02/2017 Imaging     DG Hip Unilat w or w/o Pelvis IMPRESSION 1. Degenerative changes lumbar spine and both hips. No evidence of fracture or dislocation.  2. Diffuse osteopenia. Multiple tiny lucencies are noted throughout both proximal femurs. These may be secondary to the patient's known multiple myeloma.      01/18/2017 Imaging     CT hip right wo contrast  IMPRESSION: Findings consistent with a pathologic fracture of the right femoral neck. A large lytic lesion in the superior aspect of the lateral femoral head is identified.  Possible nondisplaced fracture of the  right inferior pubic ramus.  Diffuse myeloma.      01/18/2017 - 01/23/2017 Hospital Admission    She present to the ED with complaints of severe right hip pain. she recieved a right hip hemiarthroplasty with Dr. MuPercell Miller     HISTORY OF PRESENTING ILLNESS (10/20/2016):  84 year old African-American female, with past medical history of hypertension, chronic back pain, [name] stage III, who presents with altered mental status. She was admitted for hypercalcemia. I was called for consult to rule out multiple myeloma.   The patient is joined by family today. They help to report the patient's history. The patient has been lethargic and confused for the past week. She has issues with mobility at this time. She is constipated.   The family reports the patient fell last October, and her PCP suspected she broke her back. The patient received a cortisone injection in January, which was followed by onset of pain and decreased mobility. The patient was subsequently admitted to [name], where the family reports they discovered she was being given someone else's medicine. This led to the patient's confused mental status and difficulty with ambulation. The patient fell again while in [name], and she was brought back to home by her family. She has 24/7 care at home. In the ED, she was found to have calcium more than 15, she was admitted for further management. She received calcitonin, her  calcium came down to 12.7 today. Her SPEP was positive for M protein-0.4 g/dl, possible represent a monoclonal gammopathy. Her 24-hour urine light chains are pending. Her PTH was normal at 20, PTH-rp is pending.   CURRENT THERAPY:  1. RD (Revlimid 10 mg daily, increased to 36m from cycle 3, 3 weeks on, one week off, and weekly dexamethasone 273m, started on 11/10/2016. 2.Change to maintenance Revlimid 108maily for 3 weeks on and 1 week off, and stopped dexamethasone started on 06/10/17. Reduced to 5mg79m  01/01/18 due to elevated Cr and thrombocytopenia  3. Aredia 60mg75mry 4 weeks, started on 10/24/2016 for hypercalcemia. Switch to Xgeva injections every 4 weeks on 01/02/17  INTERIM HISTORY: Kristin [name] 84 y.64 female here for follow up accompanied by her daughter today. She notes she had a Abdominal Angiogram on 12/26/17 which went well. She was given blood transfusion during her hospital stay due to low hemoglobin. She was able to have stent placed successfully. She now has good pulse in her foot. [facility] She needs a refill of Revlimid.   On review of symptoms, pt notes she has increased appetite and fluid intake.      MEDICAL HISTORY:  Past Medical History:  Diagnosis Date  . Bone cancer (HCC)[facility]"being tx'd by Dr. Rachyl Wuebker [facility] Medic[facility]" (10/09/2017)  . Chronic kidney disease (CKD), stage III (moderate) (HCC)   . Depression    Risperdal "twice/day" (09/23/2016)  . Glaucoma   . Hiatal hernia   . Hypertension   . Intractable back pain    /notes 09/23/2016  . Obesity   . Osteoarthritis    "all over" (10/09/2017)  . Osteoporosis    vertebral fracture in 2012  . Peripheral vascular disease (HCC) [place] [facility] bleed 04/2004   secondary to Mallory-Weiss tear/notes 12/28/2010  . Vertigo     SURGICAL HISTORY: Past Surgical History:  Procedure Laterality Date  . ABDOMINAL HYSTERECTOMY  1974   partial  . APPENDECTOMY     as a child/notes 12/28/2010  . BACK SURGERY    . BREAST BIOPSY Left 1999   /noteArchie Endo/2012  . CATARACT EXTRACTION W/ INTRAOCULAR LENS  IMPLANT, BILATERAL Bilateral 2000s  . ESOPHAGOGASTRODUODENOSCOPY  04/2004   /noteArchie Endo/2012  . FIXATION KYPHOPLASTY THORACIC SPINE  05/2011   T10/notes 05/25/2011  . FRACTURE SURGERY    . HIP ARTHROPLASTY Right 01/19/2017   Procedure: RIGHT HIP HEMIARTHROPLASTY;  Surgeon: MurphRenette Butters  [facility];  Laterality: Right;  . LAPAROSCOPIC CHOLECYSTECTOMY   10/2007   /noteArchie Endo2012  . LOWER EXTREMITY ANGIOGRAPHY N/A 10/09/2017   Procedure: LOWER EXTREMITY ANGIOGRAPHY;  Surgeon: BerryLorretta Harp  [facility];  Service: Cardiovascular;  Laterality: N/A;  bilateral  . LOWER EXTREMITY ANGIOGRAPHY  11/21/2017   Procedure: Lower Extremity Angiography;  Surgeon: GanjiAdrian Prows  [facility];  Service: Cardiovascular;;  . PERIPHERAL VASCULAR BALLOON ANGIOPLASTY  10/09/2017   Attempted PTA /noteArchie Endo/2019  . PERIPHERAL VASCULAR BALLOON ANGIOPLASTY  10/09/2017   Procedure: PERIPHERAL VASCULAR BALLOON ANGIOPLASTY;  Surgeon: BerryLorretta Harp  [facility];  Service: Cardiovascular;;  Attempted PTA     SOCIAL HISTORY: Social History   Socioeconomic History  . Marital status: Widowed    Spouse name: Not on file  . Number of children: Not on file  . Years of  education: Not on file  . Highest education level: Not on file  Occupational History  . Not on file  Social Needs  . Financial resource strain: Not on file  . Food insecurity:    Worry: Not on file    Inability: Not on file  . Transportation needs:    Medical: Not on file    Non-medical: Not on file  Tobacco Use  . Smoking status: Never Smoker  . Smokeless tobacco: Never Used  Substance and Sexual Activity  . Alcohol use: Yes    Comment: 10/09/2017  "glass of wine a few times/year"  . Drug use: No  . Sexual activity: Never  Lifestyle  . Physical activity:    Days per week: Not on file    Minutes per session: Not on file  . Stress: Not on file  Relationships  . Social connections:    Talks on phone: Not on file    Gets together: Not on file    Attends religious service: Not on file    Active member of club or organization: Not on file    Attends meetings of clubs or organizations: Not on file    Relationship status: Not on file  . Intimate partner violence:    Fear of current or ex partner: Not on file    Emotionally  abused: Not on file    Physically abused: Not on file    Forced sexual activity: Not on file  Other Topics Concern  . Not on file  Social History Narrative  . Not on file    FAMILY HISTORY: Family History  Problem Relation Age of Onset  . Hypertension Mother   . Osteoporosis Mother   . CVA Mother   . Cancer Neg Hx     ALLERGIES:  is allergic to penicillins; alendronate; and sulfonamide derivatives.  MEDICATIONS:  Current Outpatient Medications  Medication Sig Dispense Refill  . aspirin 81 MG chewable tablet Chew 81 mg by mouth daily.    Marland Kitchen atorvastatin (LIPITOR) 10 MG tablet Take 10 mg by mouth daily.    . Calcium Carbonate (CALCIUM 600 PO) Take 600 mg by mouth daily.     . clopidogrel (PLAVIX) 75 MG tablet Take 75 mg by mouth daily.    . feeding supplement, ENSURE ENLIVE, (ENSURE ENLIVE) LIQD Take 237 mLs by mouth 2 (two) times daily between meals. (Patient taking differently: Take 237 mLs by mouth daily. ) 237 mL 12  . furosemide (LASIX) 20 MG tablet Take 20 mg by mouth daily.    . irbesartan (AVAPRO) 75 MG tablet Take 75 mg by mouth daily after supper.    . magnesium oxide (MAG-OX) 400 (241.3 Mg) MG tablet Take 1 tablet (400 mg total) by mouth daily. 7 tablet 0  . meclizine (ANTIVERT) 25 MG tablet TAKE ONE TABLET BY MOUTH THREE TIMES DAILY AS NEEDED (Patient taking differently: Take 25 mg by mouth 3 (three) times daily as needed for dizziness. ) 90 tablet 0  . megestrol (MEGACE) 40 MG tablet Take 1 tablet (40 mg total) by mouth daily.    . mirtazapine (REMERON) 15 MG tablet Take 1 tablet (15 mg total) by mouth at bedtime. 90 tablet 1  . morphine ([name]) 30 MG 12 hr tablet Take 1 tablet (30 mg total) by mouth every 12 (twelve) hours. 60 tablet 0  . ondansetron (ZOFRAN ODT) 4 MG disintegrating tablet Take 1-2 tablets (4-8 mg total) by mouth every 8 (eight) hours as needed for nausea  or vomiting. (Patient taking differently: Take 4 mg by mouth daily. ) 30 tablet 2  . Oxycodone  HCl 10 MG TABS Take 1 tablet (10 mg total) by mouth every 6 (six) hours as needed. FOR MODERATE OR SEVERE PAIN 60 tablet 0  . RESTASIS 0.05 % ophthalmic emulsion Place 1 drop into both eyes daily.     . risperiDONE (RISPERDAL) 0.5 MG tablet Take 0.5 mg by mouth 2 (two) times daily.    Marland Kitchen SANTYL ointment Apply 1 application topically every other day.  0  . sennosides-docusate sodium (SENOKOT-S) 8.6-50 MG tablet Take 1 tablet by mouth 2 (two) times daily.     Alveda Reasons 2.5 MG TABS tablet Take 2.5 mg by mouth 2 (two) times daily with a meal.  2  . lenalidomide (REVLIMID) 5 MG capsule Take 1 capsule (5 mg total) by mouth daily. 21 capsule 1   No current facility-administered medications for this visit.    Facility-Administered Medications Ordered in Other Visits  Medication Dose Route Frequency Provider Last Rate Last Dose  . denosumab (XGEVA) injection 120 mg  120 mg Subcutaneous Once Truitt Merle, MD        REVIEW OF SYSTEMS:  Constitutional: Denies fevers, chills or abnormal night sweats  Eyes: Denies blurriness of vision, double vision or watery eyes Ears, nose, mouth, throat, and face: Denies mucositis or sore throat  Respiratory: Denies cough, dyspnea or wheezes Cardiovascular: Denies palpitation, chest discomfort  Gastrointestinal:  Denies heartburn Skin: Denies abnormal skin rashes  Lymphatics: Denies new lymphadenopathy or easy bruising  Neurological: Denies numbness, tingling or new weaknesses  Behavioral/Psych: Mood is stable, no new changes  Musculoskeletal:  All other systems were reviewed with the patient and are negative.  PHYSICAL EXAMINATION:  ECOG PERFORMANCE STATUS: 3 Vitals:   01/01/18 1403  BP: 137/63  Pulse: 70  Resp: 18  Temp: 98.2 F (36.8 C)  TempSrc: Oral  SpO2: 96%  Weight: 145 lb 3.2 oz (65.9 kg)  Height: 4' 11"  (1.499 m)     GENERAL:alert, no distress and comfortable, sitting in wheelchair  SKIN: skin color, texture, turgor are normal, no rashes or  significant lesions EYES: normal, conjunctiva are pink and non-injected, sclera clear OROPHARYNX:no exudate, no erythema and lips, buccal mucosa, and tongue normal  NECK: supple, thyroid normal size, non-tender, without nodularity LYMPH:  no palpable lymphadenopathy in the cervical, axillary or inguinal LUNGS: clear to auscultation and percussion with normal breathing effort HEART: regular rate & rhythm and no murmurs and no lower extremity edema ABDOMEN:abdomen soft, non-tender and normal bowel sounds Musculoskeletal:no cyanosis of digits and no clubbing. BLE wrapped with gauze, per patient, she has small wound on BLE.  PSYCH: alert & oriented x 3 with fluent speech NEURO: no focal motor/sensory deficits  LABORATORY DATA:  I have reviewed the data as listed CBC Latest Ref Rng & Units 01/01/2018 12/25/2017 12/22/2017  WBC 3.9 - 10.3 K/uL 4.1 3.5(L) 2.7(L)  Hemoglobin 11.6 - 15.9 g/dL 9.7(L) 9.5(L) 9.5(L)  Hematocrit 34.8 - 46.6 % 29.3(L) 28.1(L) 28.4(L)  Platelets 145 - 400 K/uL 161 80(L) 63(L)   CMP Latest Ref Rng & Units 01/01/2018 12/22/2017 11/24/2017  Glucose 70 - 140 mg/dL 71 108 143(H)  BUN 7 - 26 mg/dL 24 17 18   Creatinine 0.60 - 1.10 mg/dL 1.14(H) 1.34(H) 1.43(H)  Sodium 136 - 145 mmol/L 138 137 140  Potassium 3.5 - 5.1 mmol/L 4.6 3.9 3.8  Chloride 98 - 109 mmol/L 109 106 108  CO2 22 -  29 mmol/L 26 27 24   Calcium 8.4 - 10.4 mg/dL 8.7 8.2(L) 8.0(L)  Total Protein 6.4 - 8.3 g/dL 5.9(L) 6.3(L) 6.4  Total Bilirubin 0.2 - 1.2 mg/dL 0.7 0.6 0.4  Alkaline Phos 40 - 150 U/L 58 55 62  AST 5 - 34 U/L 17 14 12   ALT 0 - 55 U/L 15 11 7     MM LAB: M-protein (g/dl) 10/19/2016: 0.4 11/01/2016: Not Det 12/05/16: Not Det 01/02/17: Not det 02/27/17: Not det 03/24/17: not det.  04/24/17: not det.  05/19/17: not det. 06/16/17:: not det 07/21/17: not det 08/18/17: not det 09/15/17: not det 10/27/17: Not det 11/24/17: not det 12/22/17: not det  Serum kappa, lamda light chains and ratio  (mg/L) 10/21/2016: 14.8, 5362, 0.00 11/01/2016: 1.49, 10.88, 0.14 12/05/2016: 2.16, 19.31, 0.11 01/02/17: 3.01, 10.90, 0.28 02/27/17: 1.49, 20.64, 0.07 03/24/17: 2.19, 3.90, 0.56 04/24/17: 2.58, 4.02, 0.64 05/19/17: 2.22, 3.55, 0.63 06/16/17:: 4.18, 5.55, 0.75 07/21/17: 3.86, 5.10, 0.76 08/18/17: 2.90, 3.58, 0.81 09/15/17: 5.53. 5.47, 1.01 10/27/17: 7.86, 8.58, 0.92 11/24/17: 8.86, 8.16, 1.09 12/22/17: 7.79, 6.77, 1.15   24 hour urine (total protein, free lambda, ration) 10/21/2016: 1463, 1570, 0.06   PATHOLOGY  Diagnosis 01/19/2017 Femoral head, fracture, Right - FRACTURE, SEE COMMENT. Microscopic Comment There are scattered plasma cells seen with CD138, predominately in the area of fracture. Light chain in situ hybridization is non-contributory, likely due to heavy decalcification. Overt plasma cell neoplasm is not appreciated.  Interpretation 10/25/2016 Bone Marrow Flow Cytometry - NO MONOCLONAL B-CELL OR PHENOTYPICALLY ABERRANT T-CELL POPULATION IDENTIFIED.  Diagnosis 10/25/2016 Bone Marrow, Aspirate,Biopsy, and Clot - PLASMA CELL MYELOMA. - SEE COMMENT. PERIPHERAL BLOOD: - NORMOCYTIC ANEMIA. Diagnosis Note Overall, the marrow is hypercellular with increased lambda-restricted plasma cells (62% aspirate, 90% CD138). There is partial expression of CD20, but no monoclonal B-cell population is detected by flow cytometry.     RADIOGRAPHIC STUDIES: I have personally reviewed the radiological images as listed and agreed with the findings in the report. US Venous Img Lower Bilateral  Result Date: 12/05/2017 CLINICAL DATA:  Nonhealing ulcers of the lower extremities EXAM: BILATERAL LOWER EXTREMITY VENOUS DUPLEX ULTRASOUND TECHNIQUE: Gray-scale sonography with graded compression, as well as color Doppler and duplex ultrasound, were performed to evaluate the deep and superficial veins of both lower extremities. Spectral Doppler was utilized to evaluate flow at rest and with distal augmentation  maneuvers. A complete superficial venous insufficiency exam was performed in the upright standing. I personally performed the technical portion of the exam. COMPARISON:  None. FINDINGS: RIGHT Deep Venous System: The right common femoral vein is compressible. There is nonocclusive thrombus throughout the right femoral vein. The vein is not dilated. There is some recanalization with serpiginous flow through the chronic appearing thrombus. Augmentation was deferred. The popliteal vein is grossly patent and compressible. RIGHT Superficial Venous System: SFJ: No reflux GSV Prox Thigh: No reflux GSV Mid Thigh: No reflux GSV Lower Thigh: No reflux GSV Prox Calf: Not performed due to dressings GSV Mid Calf: Not performed due to dressings GSV Distal Calf: Not performed due to dressings SPJ: Not performed due to dressings SSV Prox: Not performed due to dressings SSV Mid: Not performed due to dressings SSV Distal: Not performed due to dressings Other: Examination of the superficial venous system is limited due to dressings over the legs. LEFT Deep Venous System: The left common femoral vein is compressible. There is chronic appearing thrombus along the walls of the mid and proximal left femoral vein. There is nearly occlusive thrombus  in the distal left femoral vein with recanalization of flow. The left popliteal vein is poorly visualized but grossly patent. Augmentation was deferred. LEFT Superficial Venous System: SFJ: No reflux GSV Prox Thigh: No reflux GSV Mid Thigh: No reflux GSV Lower Thigh: No reflux GSV Prox Calf: Not performed due to dressings GSV Mid Calf: Not performed due to dressings GSV Distal Calf: Not performed due to dressings SPJ: Not performed due to dressings SSV Prox: Not performed due to dressings SSV Mid: Not performed due to dressings SSV Distal: Not performed due to dressings Other: Examination of the superficial venous system was somewhat limited due to dressings over the legs. IMPRESSION: Examination  of the superficial venous system was somewhat limited. There is no evidence of venous insufficiency in the greater saphenous veins. Lesser saphenous veins were not evaluated due to lower extremity dressings in place. There is chronic partially occlusive DVT throughout the bilateral femoral veins as described. There is recanalization of flow throughout the thrombus. Electronically Signed   By: Marybelle Killings M.D.   On: 12/05/2017 09:45   [place] [facility] Eval And Mgmt  Result Date: 12/05/2017 Please refer to "Notes" to see consult details.    DG Hip Unilateral 01/02/17 IMPRESSION: 1. Degenerative changes lumbar spine and both hips. No evidence of fracture or dislocation. 2. Diffuse osteopenia. Multiple tiny lucencies are noted throughout both proximal femurs. These may be secondary to the patient's known multiple myeloma.   ASSESSMENT & PLAN:  84 y.o.woman with HTN and CKD, presented with hypercalcemia, worsening anemia since 06/2016, s/p multiple fall, multiple hospitalization recently for back pain , CT scan showedT2 vertebral body compression fracture with mild height loss.  1. 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 -We previously discussed treatment options.  due to advanced age, multiple comorbidities, very limited performance status, I do not believe she is a candidate for bone marrow transplant -she was started in induction RD. Start on dexamethasone 19m weekly and Revlimid 125mdaily; 3 weeks on, 1 week off, at adjusted for her renal function. She has been tolerating well, dose  increased to 1583mrom cycle 3 -due to her Intermediated risk disease, she may need Velcade. Due to her advanced age and comorbidities, I am concerned about her tolerance to Velcade. I will try RD for a few cycles, if she does not have good response, I will add Velcade.  -The goal of therapy is palliative, for disease control, improve her quality of life and prolong life. - Repeat multiple myeloma panel every 8 weeks  -she has underwent right hip surgery with Dr. MurPercell Miller June 2018, she was off Revlimid for about 6 weeks. -she has been tolerating Revlimid and dexamethasone well -Her M-protein is negative and she is responding well to treatment. Will continue Revlimid.  -Given her excellent response to treatment, I have changed her to maintenance therapy. -She will continue this until disease progresses.  She understands her multiple myeloma is not curable and the goal of therapy is disease control.  -Her M protein has been undetectable, no disease progression so far. -I discussed due to her significant thrombocytopenia and elevated Cr I will restart her  Revlimid at lower dose 60m once daily for 3 weeks on 1 week off to start this week or next Monday. I filled prescription for her today.  -We discussed if she has disease progression on low-dose maintenance Revlimid, then will change to other regimen. We have many other treatment options available. -Labs reviewed today,  Hg returned to 9.7 after blood transfusion last week, Cr at 1.14. Labs overall adequate to proceed with Xgeva today with Ca at 8.7.  -F/u in 4 weeks.     2. Anemia  -secondary to MM and Revlimid. -She received blood transfusion in hospital -will monitor CBC closely -consider blood transfusion if Hb<8.0 or symptomatic anemia with Hb 8-9 -Patient developed a significant anemia [facility].  HG improved after blood transfusion 5/1/419, now at 9.7 (01/01/18)   3.  History of hypercalcemia/hypocalcemia  -She has received one dose of Aredia, hypercalcemia has nearly resolved now. We'll continue every 4 weeks for multiple myeloma, or more frequent if her hypercalcemia gets worse. -Her reached normal limits previously  - I previously encouraged her to take vitD, 800 unit daily  -Her Calcium was 8.0 (04/24/17) so I suggested she takes some Vitamin D with Calcium 800 mg once a day, I'll increase her calcium to twice daily. -I strongly encouraged her to restart calcium with vitamin D BID and increase dairy intake previously. We'll resume Xgeva injection next month if her calcium level improves -Her Ca is 8.4 on (09/15/17) She can get Xgeva, but we will see her back in 6 weeks instead of 4 to allow her Ca to get higher. She is compliant with her CA supplement and takes it twice a day.  -Ca 8.7 today (01/01/18) continue with Xgeva today   4. Chronic CKD, stage II-III -previously much improved, EGFR 50-60 -Avoid nephrotoxic medicine or contrast  --Cr at 1.34 on 12/22/2017. Patient notes that she takes Lasix once daily since October 2018 for her BLE swelling.  -I suggested to the patient to hold Lasix at this time due to the creatinine levels. I suggested that the patient hold the Lasix for 10 days at most, but I also encouraged the patient's daughter to keep a watch on the patient's LE swelling and if the swelling is increasing, then it would be prudent to start Lasix again.  -Cr at 1.14 today, she is fine to restart revlimid at low dose.   5. Severe back pain and right hip pain from pathologic fracture -T2 compression fracture in the right hip is logical fracture secondary to her multiple myeloma -She will continue [name] every 12 hours and oxycodone as needed -we discussed taking laxative due to increased oxycodone prescription -We discussed using walker and avoid weight bearing on her right hip -Will give referral to orthopedic surgery for further evaluation    -She is fine to take tylenol, but she should avoid Advil due to her MM.  -Pain is controlled with Morphine and oxycodone BID, refilled oxycodone on 01/01/18   6. HTN -controlled with medication -managed by PCP -I will send a message to Dr. WJacelyn Gripand Dr. GEinar Gipto determine if there needs to be a HTN medication change in the interim.  7. Osteoporosis and fractures -Takes Boniva, which has been switched to Aredia  -Due to her poor IV access, I'll switch Aredia to Xgeva every 4 weeks -Started Xgeva 01/02/17, and continue every 4 weeks, tolerating well. She will continue for the  next 2 years  8. Goal of care discussion  -We previously discussed the incurable nature of her MM, and the overall poor prognosis, especially if she does not have good response to chemotherapy or progress on chemo -The patient understands the goal of care is palliative. -I previously recommended DNR/DNI, she will think about it   9. Anorexia  - I have previously encouraged her to eat snacks in between her meals -I previously recommended her to try mirtazapine 7.79m HS, may increase dose in future if needed  -Remeron is working well and her appetite has improved lately.  -Increased mirtazapine to 157m(04/24/17), she is also on megace  -Appetite has improved.   11. B/L ankle wound, right LE PVD  -She has had a wound in the right ankle for 2-3 months, follow-up of his wound clinic -She recently found to have severe peripheral vascular disease in the right lower extremity, revascularization with stent was not successful.   -Pt had right LE stent placenmet with Vascular Surgeon, Dr. GeBrunetta Jeanst [facility]5/14/2019.  -her right foot wound will likely heal faster   Plan -Restart Revlimid at 65m13maily this week, filled today, she will be on for 3 weeks, and off for 1 week -Refilled Oxycodone today  -Labs reveiwed and adequate to proceed with Xgeva today and continue monthly if Calcium adequate  -Lab, f/u and injection in  4 weeks    All questions were answered. The patient knows to call the clinic with any problems, questions or concerns.  I spent 20 minutes counseling the patient face to face. The total time spent in the appointment was 25 minutes and more than 50% was on counseling.  This document serves as a record of services personally performed by YanTruitt MerleD. It was created on her behalf by AmoJoslyn Devon trained medical scribe. The creation of this record is based on the scribe's personal observations and the provider's statements to them.    I have reviewed the above documentation for accuracy and completeness, and I agree with the above.     YanTruitt MerleD 01/01/2018

## 2018-01-01 NOTE — Telephone Encounter (Signed)
Called [employer] spoke to [facility] informed dosage of Revlimid has been changed from 10 mg to 5 mg.  Patient will need this asap.

## 2018-01-01 NOTE — Patient Instructions (Signed)

## 2018-01-02 ENCOUNTER — Telehealth: Payer: Self-pay

## 2018-01-02 NOTE — Telephone Encounter (Signed)
Called Johnston Ebbs with [employer] back to concern [employer] authorization [phone].  Order being processed.

## 2018-01-04 ENCOUNTER — Emergency Department (HOSPITAL_COMMUNITY): Payer: Medicare Other

## 2018-01-04 ENCOUNTER — Emergency Department (HOSPITAL_COMMUNITY)
Admission: EM | Admit: 2018-01-04 | Discharge: 2018-01-04 | Disposition: A | Discharge status: Home / Self Care | Payer: Medicare Other | Attending: Emergency Medicine | Admitting: Emergency Medicine

## 2018-01-04 ENCOUNTER — Encounter (HOSPITAL_COMMUNITY): Payer: Self-pay | Admitting: Emergency Medicine

## 2018-01-04 DIAGNOSIS — M549 Dorsalgia, unspecified: Secondary | ICD-10-CM | POA: Insufficient documentation

## 2018-01-04 DIAGNOSIS — Z9049 Acquired absence of other specified parts of digestive tract: Secondary | ICD-10-CM | POA: Insufficient documentation

## 2018-01-04 DIAGNOSIS — M25512 Pain in left shoulder: Secondary | ICD-10-CM | POA: Diagnosis not present

## 2018-01-04 DIAGNOSIS — Z85830 Personal history of malignant neoplasm of bone: Secondary | ICD-10-CM | POA: Diagnosis not present

## 2018-01-04 DIAGNOSIS — Z66 Do not resuscitate: Secondary | ICD-10-CM | POA: Insufficient documentation

## 2018-01-04 DIAGNOSIS — R102 Pelvic and perineal pain: Secondary | ICD-10-CM | POA: Insufficient documentation

## 2018-01-04 DIAGNOSIS — Z96641 Presence of right artificial hip joint: Secondary | ICD-10-CM | POA: Insufficient documentation

## 2018-01-04 DIAGNOSIS — M542 Cervicalgia: Secondary | ICD-10-CM | POA: Insufficient documentation

## 2018-01-04 DIAGNOSIS — I129 Hypertensive chronic kidney disease with stage 1 through stage 4 chronic kidney disease, or unspecified chronic kidney disease: Secondary | ICD-10-CM | POA: Diagnosis not present

## 2018-01-04 DIAGNOSIS — Z79899 Other long term (current) drug therapy: Secondary | ICD-10-CM | POA: Insufficient documentation

## 2018-01-04 DIAGNOSIS — M25511 Pain in right shoulder: Secondary | ICD-10-CM | POA: Diagnosis not present

## 2018-01-04 DIAGNOSIS — F329 Major depressive disorder, single episode, unspecified: Secondary | ICD-10-CM | POA: Diagnosis not present

## 2018-01-04 DIAGNOSIS — Z7901 Long term (current) use of anticoagulants: Secondary | ICD-10-CM | POA: Insufficient documentation

## 2018-01-04 DIAGNOSIS — Z7982 Long term (current) use of aspirin: Secondary | ICD-10-CM | POA: Diagnosis not present

## 2018-01-04 DIAGNOSIS — N183 Chronic kidney disease, stage 3 (moderate): Secondary | ICD-10-CM | POA: Insufficient documentation

## 2018-01-04 LAB — BASIC METABOLIC PANEL
Anion gap: 7 (ref 5–15)
BUN: 23 mg/dL — AB (ref 6–20)
CO2: 25 mmol/L (ref 22–32)
CREATININE: 1.22 mg/dL — AB (ref 0.44–1.00)
Calcium: 8.8 mg/dL — ABNORMAL LOW (ref 8.9–10.3)
Chloride: 108 mmol/L (ref 101–111)
GFR, EST AFRICAN AMERICAN: 45 mL/min — AB (ref >60)
GFR, EST NON AFRICAN AMERICAN: 39 mL/min — AB (ref >60)
Glucose, Bld: 100 mg/dL — ABNORMAL HIGH (ref 65–99)
Potassium: 4.6 mmol/L (ref 3.5–5.1)
SODIUM: 140 mmol/L (ref 135–145)

## 2018-01-04 LAB — CBC WITH DIFFERENTIAL/PLATELET
BASOS ABS: 0 10*3/uL (ref 0.0–0.1)
Basophils Relative: 1 %
EOS ABS: 0.1 10*3/uL (ref 0.0–0.7)
EOS PCT: 3 %
HCT: 29.5 % — ABNORMAL LOW (ref 36.0–46.0)
Hemoglobin: 9.6 g/dL — ABNORMAL LOW (ref 12.0–15.0)
Lymphocytes Relative: 44 %
Lymphs Abs: 2 10*3/uL (ref 0.7–4.0)
MCH: 31.7 pg (ref 26.0–34.0)
MCHC: 32.5 g/dL (ref 30.0–36.0)
MCV: 97.4 fL (ref 78.0–100.0)
Monocytes Absolute: 0.6 10*3/uL (ref 0.1–1.0)
Monocytes Relative: 14 %
Neutro Abs: 1.7 10*3/uL (ref 1.7–7.7)
Neutrophils Relative %: 38 %
PLATELETS: 202 10*3/uL (ref 150–400)
RBC: 3.03 MIL/uL — AB (ref 3.87–5.11)
RDW: 16.5 % — ABNORMAL HIGH (ref 11.5–15.5)
WBC: 4.5 10*3/uL (ref 4.0–10.5)

## 2018-01-04 MED ORDER — IOPAMIDOL (ISOVUE-300) INJECTION 61%
80.0000 mL | Freq: Once | INTRAVENOUS | Status: AC | PRN
  Administered 2018-01-04: 80 mL via INTRAVENOUS

## 2018-01-04 MED ORDER — MORPHINE SULFATE (PF) 2 MG/ML IV SOLN
2.0000 mg | Freq: Once | INTRAVENOUS | Status: AC
  Administered 2018-01-04: 2 mg via INTRAVENOUS
  Filled 2018-01-04: qty 1

## 2018-01-04 MED ORDER — IOPAMIDOL (ISOVUE-300) INJECTION 61%
INTRAVENOUS | Status: AC
  Filled 2018-01-04: qty 100
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
## 2018-01-04 NOTE — ED Notes (Signed)
Put rolled towel around pt's neck to remind her to not move it
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
## 2018-01-04 NOTE — ED Notes (Signed)
Patient transported to CT 

## 2018-01-15 ENCOUNTER — Encounter (HOSPITAL_BASED_OUTPATIENT_CLINIC_OR_DEPARTMENT_OTHER): Payer: Medicare Other | Attending: Internal Medicine

## 2018-01-15 DIAGNOSIS — C9000 Multiple myeloma not having achieved remission: Secondary | ICD-10-CM | POA: Diagnosis not present

## 2018-01-15 DIAGNOSIS — I120 Hypertensive chronic kidney disease with stage 5 chronic kidney disease or end stage renal disease: Secondary | ICD-10-CM | POA: Diagnosis not present

## 2018-01-15 DIAGNOSIS — L89513 Pressure ulcer of right ankle, stage 3: Secondary | ICD-10-CM | POA: Diagnosis not present

## 2018-01-15 DIAGNOSIS — N186 End stage renal disease: Secondary | ICD-10-CM | POA: Insufficient documentation

## 2018-01-15 DIAGNOSIS — Z9221 Personal history of antineoplastic chemotherapy: Secondary | ICD-10-CM | POA: Diagnosis not present

## 2018-01-17 ENCOUNTER — Other Ambulatory Visit: Payer: Self-pay | Admitting: Hematology

## 2018-01-17 DIAGNOSIS — C9000 Multiple myeloma not having achieved remission: Secondary | ICD-10-CM

## 2018-01-18 ENCOUNTER — Other Ambulatory Visit: Payer: Self-pay | Admitting: Nurse Practitioner

## 2018-01-18 DIAGNOSIS — C9000 Multiple myeloma not having achieved remission: Secondary | ICD-10-CM

## 2018-01-18 MED ORDER — MORPHINE SULFATE ER 30 MG PO TBCR: 30.0000 mg | EXTENDED_RELEASE_TABLET | Freq: Two times a day (BID) | ORAL | 0 refills | Status: DC

## 2018-01-25 DIAGNOSIS — L89513 Pressure ulcer of right ankle, stage 3: Secondary | ICD-10-CM | POA: Diagnosis not present

## 2018-01-26 ENCOUNTER — Other Ambulatory Visit: Payer: Self-pay

## 2018-01-26 DIAGNOSIS — C9000 Multiple myeloma not having achieved remission: Secondary | ICD-10-CM

## 2018-01-26 MED ORDER — LENALIDOMIDE 5 MG PO CAPS: 5.0000 mg | ORAL_CAPSULE | Freq: Every day | ORAL | 1 refills | Status: DC

## 2018-01-30 ENCOUNTER — Inpatient Hospital Stay (HOSPITAL_BASED_OUTPATIENT_CLINIC_OR_DEPARTMENT_OTHER): Payer: Medicare Other | Admitting: Hematology

## 2018-01-30 ENCOUNTER — Inpatient Hospital Stay: Payer: Medicare Other

## 2018-01-30 ENCOUNTER — Encounter: Payer: Self-pay | Admitting: Hematology

## 2018-01-30 ENCOUNTER — Telehealth: Payer: Self-pay

## 2018-01-30 ENCOUNTER — Inpatient Hospital Stay: Payer: Medicare Other | Attending: Hematology

## 2018-01-30 VITALS — BP 127/67 | HR 69 | Temp 98.0°F | Resp 18 | Ht 59.0 in | Wt 140.1 lb

## 2018-01-30 DIAGNOSIS — E669 Obesity, unspecified: Secondary | ICD-10-CM | POA: Insufficient documentation

## 2018-01-30 DIAGNOSIS — Z7982 Long term (current) use of aspirin: Secondary | ICD-10-CM | POA: Diagnosis not present

## 2018-01-30 DIAGNOSIS — Z9221 Personal history of antineoplastic chemotherapy: Secondary | ICD-10-CM | POA: Diagnosis not present

## 2018-01-30 DIAGNOSIS — Z7901 Long term (current) use of anticoagulants: Secondary | ICD-10-CM

## 2018-01-30 DIAGNOSIS — M810 Age-related osteoporosis without current pathological fracture: Secondary | ICD-10-CM

## 2018-01-30 DIAGNOSIS — D649 Anemia, unspecified: Secondary | ICD-10-CM | POA: Insufficient documentation

## 2018-01-30 DIAGNOSIS — M549 Dorsalgia, unspecified: Secondary | ICD-10-CM | POA: Insufficient documentation

## 2018-01-30 DIAGNOSIS — I129 Hypertensive chronic kidney disease with stage 1 through stage 4 chronic kidney disease, or unspecified chronic kidney disease: Secondary | ICD-10-CM | POA: Insufficient documentation

## 2018-01-30 DIAGNOSIS — I700 Atherosclerosis of aorta: Secondary | ICD-10-CM | POA: Diagnosis not present

## 2018-01-30 DIAGNOSIS — M1990 Unspecified osteoarthritis, unspecified site: Secondary | ICD-10-CM

## 2018-01-30 DIAGNOSIS — N183 Chronic kidney disease, stage 3 (moderate): Secondary | ICD-10-CM | POA: Insufficient documentation

## 2018-01-30 DIAGNOSIS — I10 Essential (primary) hypertension: Secondary | ICD-10-CM

## 2018-01-30 DIAGNOSIS — K449 Diaphragmatic hernia without obstruction or gangrene: Secondary | ICD-10-CM

## 2018-01-30 DIAGNOSIS — K5900 Constipation, unspecified: Secondary | ICD-10-CM | POA: Insufficient documentation

## 2018-01-30 DIAGNOSIS — I739 Peripheral vascular disease, unspecified: Secondary | ICD-10-CM | POA: Insufficient documentation

## 2018-01-30 DIAGNOSIS — C9000 Multiple myeloma not having achieved remission: Secondary | ICD-10-CM | POA: Diagnosis present

## 2018-01-30 DIAGNOSIS — Z79899 Other long term (current) drug therapy: Secondary | ICD-10-CM | POA: Diagnosis not present

## 2018-01-30 DIAGNOSIS — F329 Major depressive disorder, single episode, unspecified: Secondary | ICD-10-CM | POA: Diagnosis not present

## 2018-01-30 DIAGNOSIS — M8000XS Age-related osteoporosis with current pathological fracture, unspecified site, sequela: Secondary | ICD-10-CM

## 2018-01-30 DIAGNOSIS — R410 Disorientation, unspecified: Secondary | ICD-10-CM | POA: Insufficient documentation

## 2018-01-30 DIAGNOSIS — D630 Anemia in neoplastic disease: Secondary | ICD-10-CM | POA: Diagnosis not present

## 2018-01-30 LAB — COMPREHENSIVE METABOLIC PANEL
ALBUMIN: 3.6 g/dL (ref 3.5–5.0)
ALT: <6 U/L (ref 0–55)
ANION GAP: 7 (ref 3–11)
AST: 12 U/L (ref 5–34)
Alkaline Phosphatase: 61 U/L (ref 40–150)
BUN: 18 mg/dL (ref 7–26)
CHLORIDE: 109 mmol/L (ref 98–109)
CO2: 24 mmol/L (ref 22–29)
Calcium: 8.8 mg/dL (ref 8.4–10.4)
Creatinine, Ser: 1.25 mg/dL — ABNORMAL HIGH (ref 0.60–1.10)
GFR calc non Af Amer: 38 mL/min — ABNORMAL LOW (ref >60)
GFR, EST AFRICAN AMERICAN: 44 mL/min — AB (ref >60)
GLUCOSE: 116 mg/dL (ref 70–140)
POTASSIUM: 3.8 mmol/L (ref 3.5–5.1)
SODIUM: 140 mmol/L (ref 136–145)
Total Bilirubin: 0.4 mg/dL (ref 0.2–1.2)
Total Protein: 7 g/dL (ref 6.4–8.3)

## 2018-01-30 LAB — CBC WITH DIFFERENTIAL/PLATELET
Basophils Absolute: 0.1 10*3/uL (ref 0.0–0.1)
Basophils Relative: 2 %
Eosinophils Absolute: 0.1 10*3/uL (ref 0.0–0.5)
Eosinophils Relative: 2 %
HEMATOCRIT: 34.1 % — AB (ref 34.8–46.6)
HEMOGLOBIN: 11.1 g/dL — AB (ref 11.6–15.9)
Lymphocytes Relative: 38 %
Lymphs Abs: 1.7 10*3/uL (ref 0.9–3.3)
MCH: 33.3 pg (ref 25.1–34.0)
MCHC: 32.7 g/dL (ref 31.5–36.0)
MCV: 101.9 fL — AB (ref 79.5–101.0)
MONO ABS: 0.4 10*3/uL (ref 0.1–0.9)
MONOS PCT: 10 %
NEUTROS ABS: 2.2 10*3/uL (ref 1.5–6.5)
NEUTROS PCT: 48 %
Platelets: 240 10*3/uL (ref 145–400)
RBC: 3.35 MIL/uL — ABNORMAL LOW (ref 3.70–5.45)
RDW: 17.6 % — AB (ref 11.2–14.5)
WBC: 4.5 10*3/uL (ref 3.9–10.3)

## 2018-01-30 MED ORDER — DENOSUMAB 120 MG/1.7ML ~~LOC~~ SOLN
SUBCUTANEOUS | Status: AC
  Filled 2018-01-30: qty 1.7

## 2018-01-30 MED ORDER — DENOSUMAB 120 MG/1.7ML ~~LOC~~ SOLN
120.0000 mg | Freq: Once | SUBCUTANEOUS | Status: AC
  Administered 2018-01-30: 120 mg via SUBCUTANEOUS

## 2018-01-30 NOTE — Patient Instructions (Signed)
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
## 2018-01-30 NOTE — Telephone Encounter (Signed)
Printed avs and calender of upcoming appointment per 6/18 los

## 2018-01-31 LAB — KAPPA/LAMBDA LIGHT CHAINS
KAPPA FREE LGHT CHN: 57.9 mg/L — AB (ref 3.3–19.4)
KAPPA, LAMDA LIGHT CHAIN RATIO: 1.23 (ref 0.26–1.65)
LAMDA FREE LIGHT CHAINS: 47 mg/L — AB (ref 5.7–26.3)

## 2018-02-01 LAB — MULTIPLE MYELOMA PANEL, SERUM
ALBUMIN SERPL ELPH-MCNC: 3.4 g/dL (ref 2.9–4.4)
ALBUMIN/GLOB SERPL: 1.1 (ref 0.7–1.7)
ALPHA 1: 0.2 g/dL (ref 0.0–0.4)
ALPHA2 GLOB SERPL ELPH-MCNC: 0.6 g/dL (ref 0.4–1.0)
B-GLOBULIN SERPL ELPH-MCNC: 0.9 g/dL (ref 0.7–1.3)
GAMMA GLOB SERPL ELPH-MCNC: 1.4 g/dL (ref 0.4–1.8)
GLOBULIN, TOTAL: 3.1 g/dL (ref 2.2–3.9)
IGA: 516 mg/dL — AB (ref 64–422)
IgG (Immunoglobin G), Serum: 1363 mg/dL (ref 700–1600)
IgM (Immunoglobulin M), Srm: 20 mg/dL — ABNORMAL LOW (ref 26–217)
Total Protein ELP: 6.5 g/dL (ref 6.0–8.5)

## 2018-02-05 DIAGNOSIS — L89513 Pressure ulcer of right ankle, stage 3: Secondary | ICD-10-CM | POA: Diagnosis not present

## 2018-02-20 ENCOUNTER — Telehealth: Payer: Self-pay

## 2018-02-20 NOTE — Telephone Encounter (Signed)
Faxed script and [employer] authorization to [employer] for Revlimid.

## 2018-02-23 ENCOUNTER — Other Ambulatory Visit: Payer: Self-pay | Admitting: Nurse Practitioner

## 2018-02-23 ENCOUNTER — Other Ambulatory Visit: Payer: Self-pay | Admitting: Hematology

## 2018-02-23 DIAGNOSIS — C9000 Multiple myeloma not having achieved remission: Secondary | ICD-10-CM

## 2018-02-23 MED ORDER — OXYCODONE HCL 10 MG PO TABS: 10.0000 mg | ORAL_TABLET | Freq: Four times a day (QID) | ORAL | 0 refills | Status: DC | PRN

## 2018-02-23 MED ORDER — MORPHINE SULFATE ER 30 MG PO TBCR: 30.0000 mg | EXTENDED_RELEASE_TABLET | Freq: Two times a day (BID) | ORAL | 0 refills | Status: DC

## 2018-02-27 ENCOUNTER — Inpatient Hospital Stay: Payer: Medicare Other | Attending: Hematology | Admitting: Nurse Practitioner

## 2018-02-27 ENCOUNTER — Inpatient Hospital Stay: Payer: Medicare Other

## 2018-02-27 ENCOUNTER — Encounter: Payer: Self-pay | Admitting: Nurse Practitioner

## 2018-02-27 ENCOUNTER — Telehealth: Payer: Self-pay | Admitting: Nurse Practitioner

## 2018-02-27 VITALS — BP 148/75 | HR 87 | Temp 97.9°F | Resp 17 | Ht 59.0 in | Wt 134.9 lb

## 2018-02-27 DIAGNOSIS — M810 Age-related osteoporosis without current pathological fracture: Secondary | ICD-10-CM | POA: Diagnosis not present

## 2018-02-27 DIAGNOSIS — Z7901 Long term (current) use of anticoagulants: Secondary | ICD-10-CM | POA: Diagnosis not present

## 2018-02-27 DIAGNOSIS — F329 Major depressive disorder, single episode, unspecified: Secondary | ICD-10-CM | POA: Insufficient documentation

## 2018-02-27 DIAGNOSIS — K449 Diaphragmatic hernia without obstruction or gangrene: Secondary | ICD-10-CM | POA: Diagnosis not present

## 2018-02-27 DIAGNOSIS — Z79899 Other long term (current) drug therapy: Secondary | ICD-10-CM | POA: Insufficient documentation

## 2018-02-27 DIAGNOSIS — C9000 Multiple myeloma not having achieved remission: Secondary | ICD-10-CM

## 2018-02-27 DIAGNOSIS — Z7982 Long term (current) use of aspirin: Secondary | ICD-10-CM | POA: Insufficient documentation

## 2018-02-27 DIAGNOSIS — Z9221 Personal history of antineoplastic chemotherapy: Secondary | ICD-10-CM | POA: Insufficient documentation

## 2018-02-27 DIAGNOSIS — R42 Dizziness and giddiness: Secondary | ICD-10-CM | POA: Insufficient documentation

## 2018-02-27 DIAGNOSIS — I129 Hypertensive chronic kidney disease with stage 1 through stage 4 chronic kidney disease, or unspecified chronic kidney disease: Secondary | ICD-10-CM | POA: Insufficient documentation

## 2018-02-27 DIAGNOSIS — R634 Abnormal weight loss: Secondary | ICD-10-CM | POA: Insufficient documentation

## 2018-02-27 DIAGNOSIS — M549 Dorsalgia, unspecified: Secondary | ICD-10-CM | POA: Diagnosis not present

## 2018-02-27 DIAGNOSIS — I739 Peripheral vascular disease, unspecified: Secondary | ICD-10-CM | POA: Diagnosis not present

## 2018-02-27 DIAGNOSIS — Z8719 Personal history of other diseases of the digestive system: Secondary | ICD-10-CM | POA: Diagnosis not present

## 2018-02-27 DIAGNOSIS — N183 Chronic kidney disease, stage 3 (moderate): Secondary | ICD-10-CM | POA: Diagnosis not present

## 2018-02-27 DIAGNOSIS — D649 Anemia, unspecified: Secondary | ICD-10-CM | POA: Insufficient documentation

## 2018-02-27 DIAGNOSIS — E669 Obesity, unspecified: Secondary | ICD-10-CM | POA: Insufficient documentation

## 2018-02-27 LAB — CBC WITH DIFFERENTIAL/PLATELET
BASOS ABS: 0 10*3/uL (ref 0.0–0.1)
BASOS PCT: 1 %
EOS ABS: 0.1 10*3/uL (ref 0.0–0.5)
Eosinophils Relative: 3 %
HEMATOCRIT: 33.7 % — AB (ref 34.8–46.6)
Hemoglobin: 11.3 g/dL — ABNORMAL LOW (ref 11.6–15.9)
Lymphocytes Relative: 31 %
Lymphs Abs: 1.4 10*3/uL (ref 0.9–3.3)
MCH: 33.5 pg (ref 25.1–34.0)
MCHC: 33.6 g/dL (ref 31.5–36.0)
MCV: 99.8 fL (ref 79.5–101.0)
MONO ABS: 0.4 10*3/uL (ref 0.1–0.9)
Monocytes Relative: 9 %
NEUTROS ABS: 2.5 10*3/uL (ref 1.5–6.5)
NEUTROS PCT: 56 %
Platelets: 264 10*3/uL (ref 145–400)
RBC: 3.38 MIL/uL — ABNORMAL LOW (ref 3.70–5.45)
RDW: 15.8 % — AB (ref 11.2–14.5)
WBC: 4.5 10*3/uL (ref 3.9–10.3)

## 2018-02-27 LAB — COMPREHENSIVE METABOLIC PANEL
ALBUMIN: 3.4 g/dL — AB (ref 3.5–5.0)
ALT: 10 U/L (ref 0–44)
ANION GAP: 6 (ref 5–15)
AST: 12 U/L — ABNORMAL LOW (ref 15–41)
Alkaline Phosphatase: 63 U/L (ref 38–126)
BUN: 19 mg/dL (ref 8–23)
CALCIUM: 9.5 mg/dL (ref 8.9–10.3)
CO2: 27 mmol/L (ref 22–32)
CREATININE: 1.27 mg/dL — AB (ref 0.44–1.00)
Chloride: 107 mmol/L (ref 98–111)
GFR calc Af Amer: 43 mL/min — ABNORMAL LOW (ref >60)
GFR, EST NON AFRICAN AMERICAN: 37 mL/min — AB (ref >60)
Glucose, Bld: 124 mg/dL — ABNORMAL HIGH (ref 70–99)
POTASSIUM: 3.6 mmol/L (ref 3.5–5.1)
Sodium: 140 mmol/L (ref 135–145)
TOTAL PROTEIN: 6.9 g/dL (ref 6.5–8.1)
Total Bilirubin: 0.4 mg/dL (ref 0.3–1.2)

## 2018-02-27 MED ORDER — DENOSUMAB 120 MG/1.7ML ~~LOC~~ SOLN
SUBCUTANEOUS | Status: AC
  Filled 2018-02-27: qty 1.7

## 2018-02-27 MED ORDER — DENOSUMAB 120 MG/1.7ML ~~LOC~~ SOLN
120.0000 mg | Freq: Once | SUBCUTANEOUS | Status: AC
  Administered 2018-02-27: 120 mg via SUBCUTANEOUS

## 2018-02-27 NOTE — Progress Notes (Signed)
[facility]  Telephone:([phone]) [phone] Fax:(336) [phone]  Clinic Follow up Note   Patient Care Team: Kristin Shanks, MD as PCP - General (Family Medicine) 02/27/2018  SUMMARY OF ONCOLOGIC HISTORY:   Multiple myeloma ([place])   10/18/2016 - 10/26/2016 Hospital Admission    Pt was admitted for symptomatic hypercalcemia, AKI, bone marrow biopsy done during hospital stay, and she received Aredia       10/19/2016 Tumor Marker    SPEP showed M-protein 0.4g/dl, serum lambda light chain 5362, ratio 0.00      10/25/2016 Bone Marrow Biopsy     Overall, the marrow is hypercellular with increased lambda-restricted plasma cells (62% aspirate, 90% CD138). There is partial expression of CD20, but no monoclonal B-cell population is detected by flow cytometry.       10/25/2016 Initial Diagnosis    Multiple myeloma ([place])      10/25/2016 Miscellaneous    Cytogenetics and FISH showed loss of 13q, which is intermediate risk for MM       11/10/2016 -  Chemotherapy    Revlimid 10 mg (increased to 55m on cycle 2) daily, 3 weeks on, and one week off, dexamethasone 20 mg weekly, was off from early June to mid July 2018 due to her right hip fracture and hospitalization.  Changed to maintenence Revlimid 128mand stopped dexamethasone starting next cycle, 06/10/17         01/02/2017 Imaging     DG Hip Unilat w or w/o Pelvis IMPRESSION 1. Degenerative changes lumbar spine and both hips. No evidence of fracture or dislocation.  2. Diffuse osteopenia. Multiple tiny lucencies are noted throughout both proximal femurs. These may be secondary to the patient's known multiple myeloma.      01/18/2017 Imaging     CT hip right wo contrast  IMPRESSION: Findings consistent with a pathologic fracture of the right femoral neck. A large lytic lesion in the superior aspect of the lateral femoral head is identified.  Possible nondisplaced fracture of the right inferior pubic ramus.  Diffuse  myeloma.      01/18/2017 - 01/23/2017 Hospital Admission    She present to the ED with complaints of severe right hip pain. she recieved a right hip hemiarthroplasty with Dr. MuPercell [name]   CURRENT THERAPY:  1. RD (Revlimid 10 mg daily, increased to 1590mrom cycle 3, 3 weeks on, one week off, and weekly dexamethasone 64m3mstarted on 11/10/2016. 2.Change to maintenance Revlimid 10mg68mly for 3 weeks on and 1 week off, and stopped dexamethasone started on 06/10/17. Reduced to 5mg o37m/20/19 due to elevated Cr and thrombocytopenia  3. Aredia 60mg e86m 4 weeks, started on 10/24/2016 for hypercalcemia. Switch to Xgeva injections every 4 weeks on 01/02/17   INTERVAL HISTORY: [name] reSiglins for follow up as scheduled. She continues revlimid daily for 3 weeks, she thinks she will be off next week. Her daughter manages her medication. She denies n/v/c/d. Appetite is fair, eats large breakfast, smaller lunch and dinner. She ambulates with walker at home. Had "flu" like illness last week with cough, productive with yellow sputum, mild dyspnea, and rhinorrhea. This resolved. She was never febrile. Today she denies cough, chest pain, or dyspnea.   REVIEW OF SYSTEMS:   Constitutional: Denies fevers, chills or (+) weight loss  (+) appetite is fair  Eyes: Denies blurriness of vision Ears, nose, mouth, throat, and face: Denies mucositis or sore throat (+) rhinorrhea last week, resolved  Respiratory: Denies cough, dyspnea or  wheezes (+) respiratory illness with productive cough and dyspnea last week, resolved  Cardiovascular: Denies palpitation, chest discomfort or lower extremity swelling Gastrointestinal:  Denies nausea, vomiting, constipation, diarrhea, heartburn or change in bowel habits Skin: Denies abnormal skin rashes Lymphatics: Denies new lymphadenopathy or easy bruising Neurological:Denies numbness, tingling or new weaknesses Behavioral/Psych: Mood is stable, no new changes  MSK: (+) chronic hip  pain, not limiting function  All other systems were reviewed with the patient and are negative.  MEDICAL HISTORY:  Past Medical History:  Diagnosis Date  . Bone cancer [facility])    "being tx'd by Dr. Burr [name] @ [facility]" (10/09/2017)  . Chronic kidney disease (CKD), stage III (moderate) (HCC)   . Depression    Risperdal "twice/day" (09/23/2016)  . Glaucoma   . Hiatal hernia   . Hypertension   . Intractable back pain    /notes 09/23/2016  . Obesity   . Osteoarthritis    "all over" (10/09/2017)  . Osteoporosis    vertebral fracture in 2012  . Peripheral vascular disease ([place])   . Upper GI bleed 04/2004   secondary to Mallory-Weiss tear/notes 12/28/2010  . Vertigo     SURGICAL HISTORY: Past Surgical History:  Procedure Laterality Date  . ABDOMINAL HYSTERECTOMY  1974   partial  . APPENDECTOMY     as a child/notes 12/28/2010  . BACK SURGERY    . BREAST BIOPSY Left 1999   Kristin [name] 12/28/2010  . CATARACT EXTRACTION W/ INTRAOCULAR LENS  IMPLANT, BILATERAL Bilateral 2000s  . ESOPHAGOGASTRODUODENOSCOPY  04/2004   Kristin [name] 12/28/2010  . FIXATION KYPHOPLASTY THORACIC SPINE  05/2011   T10/notes 05/25/2011  . FRACTURE SURGERY    . HIP ARTHROPLASTY Right 01/19/2017   Procedure: RIGHT HIP HEMIARTHROPLASTY;  Surgeon: Kristin Butters, MD;  Location: [facility];  Service: Orthopedics;  Laterality: Right;  . LAPAROSCOPIC CHOLECYSTECTOMY  10/2007   Kristin [name] 12/14/2010  . LOWER EXTREMITY ANGIOGRAPHY N/A 10/09/2017   Procedure: LOWER EXTREMITY ANGIOGRAPHY;  Surgeon: Kristin Harp, MD;  Location: [facility];  Service: Cardiovascular;  Laterality: N/A;  bilateral  . LOWER EXTREMITY ANGIOGRAPHY  11/21/2017   Procedure: Lower Extremity Angiography;  Surgeon: Kristin Prows, MD;  Location: [facility];  Service: Cardiovascular;;  . PERIPHERAL VASCULAR BALLOON ANGIOPLASTY  10/09/2017   Attempted PTA Kristin [name] 10/09/2017  . PERIPHERAL VASCULAR BALLOON ANGIOPLASTY  10/09/2017   Procedure: PERIPHERAL VASCULAR  BALLOON ANGIOPLASTY;  Surgeon: Kristin Harp, MD;  Location: [facility];  Service: Cardiovascular;;  Attempted PTA     I have reviewed the social history and family history with the patient and they are unchanged from previous note.  ALLERGIES:  is allergic to penicillins; tramadol; alendronate; and sulfonamide derivatives.  MEDICATIONS:  Current Outpatient Medications  Medication Sig Dispense Refill  . amlodipine-atorvastatin (CADUET) 2.5-10 MG tablet Take 1 tablet by mouth daily.    Marland Kitchen aspirin 81 MG chewable tablet Chew 81 mg by mouth daily.    Marland Kitchen atorvastatin (LIPITOR) 10 MG tablet Take 10 mg by mouth daily.    . Calcium Carbonate (CALCIUM 600 PO) Take 600 mg by mouth daily.     . clopidogrel (PLAVIX) 75 MG tablet Take 75 mg by mouth daily.    . feeding supplement, ENSURE ENLIVE, (ENSURE ENLIVE) LIQD Take 237 mLs by mouth 2 (two) times daily between meals. (Patient taking differently: Take 237 mLs by mouth daily. ) 237 mL 12  . furosemide (LASIX) 20 MG tablet Take 20 mg by mouth daily.    Marland Kitchen  irbesartan (AVAPRO) 75 MG tablet Take 75 mg by mouth daily after supper.    Marland Kitchen lenalidomide (REVLIMID) 5 MG capsule Take 1 capsule (5 mg total) by mouth daily. ADULT Female:  Authorization #[id number]  01/26/2018 21 capsule 1  . magnesium oxide (MAG-OX) 400 (241.3 Mg) MG tablet Take 1 tablet (400 mg total) by mouth daily. 7 tablet 0  . megestrol (MEGACE) 40 MG tablet Take 1 tablet (40 mg total) by mouth daily.    . mirtazapine (REMERON) 15 MG tablet Take 1 tablet (15 mg total) by mouth at bedtime. 90 tablet 1  . mirtazapine (REMERON) 7.5 MG tablet Take 7.5 mg by mouth at bedtime.  3  . morphine ([name]) 30 MG 12 hr tablet Take 1 tablet (30 mg total) by mouth every 12 (twelve) hours. 60 tablet 0  . ondansetron (ZOFRAN ODT) 4 MG disintegrating tablet Take 1-2 tablets (4-8 mg total) by mouth every 8 (eight) hours as needed for nausea or vomiting. (Patient taking differently: Take 4 mg by mouth  daily. ) 30 tablet 2  . Oxycodone HCl 10 MG TABS Take 1 tablet (10 mg total) by mouth every 6 (six) hours as needed. FOR MODERATE OR SEVERE PAIN 60 tablet 0  . RESTASIS 0.05 % ophthalmic emulsion Place 1 drop into both eyes daily.     . risperiDONE (RISPERDAL) 0.5 MG tablet Take 0.5 mg by mouth daily.     Marland Kitchen SANTYL ointment Apply 1 application topically every other day.  0  . sennosides-docusate sodium (SENOKOT-S) 8.6-50 MG tablet Take 1 tablet by mouth 2 (two) times daily.     Marland Kitchen triamcinolone (KENALOG) 0.025 % cream APPLY TO AFFECTED AREA TWICE A DAY FOR 10 DAYS  1  . XARELTO 2.5 MG TABS tablet Take 2.5 mg by mouth 2 (two) times daily with a meal.  2   No current facility-administered medications for this visit.    Facility-Administered Medications Ordered in Other Visits  Medication Dose Route Frequency Provider Last Rate Last Dose  . denosumab (XGEVA) injection 120 mg  120 mg Subcutaneous Once Truitt Merle, MD        PHYSICAL EXAMINATION: ECOG PERFORMANCE STATUS: 1 - Symptomatic but completely ambulatory  Vitals:   02/27/18 1157 02/27/18 1227  BP: (!) 149/66 (!) 148/75  Pulse: 87   Resp: 17   Temp: 97.9 F (36.6 C)   SpO2: 98%    Filed Weights   02/27/18 1157  Weight: 134 lb 14.4 oz (61.2 kg)    GENERAL:alert, no distress and comfortable SKIN: no rashes or significant lesions EYES: normal, Conjunctiva are pink and non-injected, sclera clear OROPHARYNX:no thrush or ulcers  LYMPH:  no palpable cervical or supraclavicular lymphadenopathy  LUNGS: clear to auscultation with normal breathing effort HEART: regular rate & rhythm and no murmurs and no lower extremity edema ABDOMEN:abdomen soft, non-tender and normal bowel sounds Musculoskeletal:no cyanosis of digits and no clubbing  NEURO: alert & oriented x 3 with fluent speech, no focal motor/sensory deficits  LABORATORY DATA:  I have reviewed the data as listed CBC Latest Ref Rng & Units 02/27/2018 01/30/2018 01/04/2018  WBC 3.9  - 10.3 K/uL 4.5 4.5 4.5  Hemoglobin 11.6 - 15.9 g/dL 11.3(L) 11.1(L) 9.6(L)  Hematocrit 34.8 - 46.6 % 33.7(L) 34.1(L) 29.5(L)  Platelets 145 - 400 K/uL 264 240 202     CMP Latest Ref Rng & Units 02/27/2018 01/30/2018 01/04/2018  Glucose 70 - 99 mg/dL 124(H) 116 100(H)  BUN 8 - 23 mg/dL 19  18 23(H)  Creatinine 0.44 - 1.00 mg/dL 1.27(H) 1.25(H) 1.22(H)  Sodium 135 - 145 mmol/L 140 140 140  Potassium 3.5 - 5.1 mmol/L 3.6 3.8 4.6  Chloride 98 - 111 mmol/L 107 109 108  CO2 22 - 32 mmol/L 27 24 25   Calcium 8.9 - 10.3 mg/dL 9.5 8.8 8.8(L)  Total Protein 6.5 - 8.1 g/dL 6.9 7.0 -  Total Bilirubin 0.3 - 1.2 mg/dL 0.4 0.4 -  Alkaline Phos 38 - 126 U/L 63 61 -  AST 15 - 41 U/L 12(L) 12 -  ALT 0 - 44 U/L 10 <6 -   MM LAB:  M-protein (g/dl) 10/19/2016: 0.4 11/01/2016: Not Det 12/05/16: Not Det 01/02/17: Not det 02/27/17: Not det 03/24/17: not det.  04/24/17: not det.  05/19/17: not det. 06/16/17:: not det 07/21/17: not det 08/18/17: not det 09/15/17: not det 10/27/17: Not det 11/24/17: not det 12/22/17: not det 01/30/18: not detected 02/27/18: pending   Serum kappa, lamda light chains and ratio (mg/L) 10/21/2016: 14.8, 5362, 0.00 11/01/2016: 1.49, 10.88, 0.14 12/05/2016: 2.16, 19.31, 0.11 01/02/17: 3.01, 10.90, 0.28 02/27/17: 1.49, 20.64, 0.07 03/24/17: 2.19, 3.90, 0.56 04/24/17: 2.58, 4.02, 0.64 05/19/17: 2.22, 3.55, 0.63 06/16/17:: 4.18, 5.55, 0.75 07/21/17: 3.86, 5.10, 0.76 08/18/17: 2.90, 3.58, 0.81 09/15/17: 5.53. 5.47, 1.01 10/27/17: 7.86, 8.58, 0.92 11/24/17: 8.86, 8.16, 1.09 12/22/17: 7.79, 6.77, 1.15 01/30/18: 5.79, 4.70, 1.23   24 hour urine (total protein, free lambda, ration) 10/21/2016: 1463, 1570, 0.06   PATHOLOGY  Diagnosis 01/19/2017 Femoral head, fracture, Right - FRACTURE, SEE COMMENT. Microscopic Comment There are scattered plasma cells seen with CD138, predominately in the area of fracture. Light chain in situ hybridization is non-contributory, likely due to heavy  decalcification. Overt plasma cell neoplasm is not appreciated.  Interpretation 10/25/2016 Bone Marrow Flow Cytometry - NO MONOCLONAL B-CELL OR PHENOTYPICALLY ABERRANT T-CELL POPULATION IDENTIFIED.  Diagnosis 10/25/2016 Bone Marrow, Aspirate,Biopsy, and Clot - PLASMA CELL MYELOMA. - SEE COMMENT. PERIPHERAL BLOOD: - NORMOCYTIC ANEMIA. Diagnosis Note Overall, the marrow is hypercellular with increased lambda-restricted plasma cells (62% aspirate, 90% CD138). There is partial expression of CD20, but no monoclonal B-cell population is detected by flow cytometry.         RADIOGRAPHIC STUDIES: I have personally reviewed the radiological images as listed and agreed with the findings in the report. No results found.   ASSESSMENT & PLAN: 84 y.o.woman with HTN and CKD, presented with hypercalcemia, worsening anemia since 06/2016, s/p multiple fall, multiple hospitalization recently for back pain , CT scan showedT2 vertebral body compression fracture with mild height loss.  1. Multiple Myeloma, lambda light chain disease, staging III, intermediate risk (13q-) 2. Anemia  3. History of hepercalcemia/hypocalcemia  4. Chronic CKD, stage II-III  5. Severe back pain and right hip pain from pathologic fracture  6. HTN 7. Osteoporosis and fractures  8. Goals of care discussion  9. Anorexia 10. B/l ankle wound, right LE PVD  Kristin [name] appears stable. She continues Revlimid 5 mg daily 3 weeks on/1 week off. She is tolerating treatment well overall. M-protein ws negative last month, Myeloma panel pending today. She has no significant anemia or other cytopenias, labs adequate to continue Revlimid 5 mg daily 3 weeks on/1 week off. Refill has been ordered. Calcium is normal, she will receive Xgeva today. I reviewed if she develops flu-like illness or other signs of infection she will call [facility]. Lab, f/u in 4 weeks with injection.   BP is slightly elevated from her baseline, she will call PCP.  PLAN: -Labs reviewed, adequate to proceed with Xgeva today -Continue revlimid 5 mg daily, 3 weeks on and 1 week off, she will be off next week  -Lab, f/u and injection in 4 weeks   All questions were answered. The patient knows to call the clinic with any problems, questions or concerns. No barriers to learning was detected.     Kristin Feeling, NP 02/27/18

## 2018-02-27 NOTE — Telephone Encounter (Signed)
Scheduled appt per 7/16 los - gave patient aVS and calender per los.

## 2018-02-28 LAB — MULTIPLE MYELOMA PANEL, SERUM
ALBUMIN SERPL ELPH-MCNC: 3.1 g/dL (ref 2.9–4.4)
Albumin/Glob SerPl: 1 (ref 0.7–1.7)
Alpha 1: 0.2 g/dL (ref 0.0–0.4)
Alpha2 Glob SerPl Elph-Mcnc: 0.7 g/dL (ref 0.4–1.0)
B-GLOBULIN SERPL ELPH-MCNC: 0.9 g/dL (ref 0.7–1.3)
Gamma Glob SerPl Elph-Mcnc: 1.6 g/dL (ref 0.4–1.8)
Globulin, Total: 3.4 g/dL (ref 2.2–3.9)
IGA: 505 mg/dL — AB (ref 64–422)
IGM (IMMUNOGLOBULIN M), SRM: 21 mg/dL — AB (ref 26–217)
IgG (Immunoglobin G), Serum: 1459 mg/dL (ref 700–1600)
TOTAL PROTEIN ELP: 6.5 g/dL (ref 6.0–8.5)

## 2018-02-28 LAB — KAPPA/LAMBDA LIGHT CHAINS
KAPPA, LAMDA LIGHT CHAIN RATIO: 1.23 (ref 0.26–1.65)
Kappa free light chain: 52.7 mg/L — ABNORMAL HIGH (ref 3.3–19.4)
LAMDA FREE LIGHT CHAINS: 43 mg/L — AB (ref 5.7–26.3)

## 2018-03-19 ENCOUNTER — Ambulatory Visit: Payer: Medicare Other | Attending: Family Medicine | Admitting: Physical Therapy

## 2018-03-19 ENCOUNTER — Telehealth: Payer: Self-pay | Admitting: Physical Therapy

## 2018-03-19 ENCOUNTER — Encounter: Payer: Self-pay | Admitting: Physical Therapy

## 2018-03-19 ENCOUNTER — Other Ambulatory Visit: Payer: Self-pay

## 2018-03-19 DIAGNOSIS — M25511 Pain in right shoulder: Secondary | ICD-10-CM

## 2018-03-19 DIAGNOSIS — M6281 Muscle weakness (generalized): Secondary | ICD-10-CM | POA: Diagnosis present

## 2018-03-19 DIAGNOSIS — M25551 Pain in right hip: Secondary | ICD-10-CM | POA: Diagnosis present

## 2018-03-19 DIAGNOSIS — M25611 Stiffness of right shoulder, not elsewhere classified: Secondary | ICD-10-CM

## 2018-03-19 NOTE — Patient Instructions (Signed)
Access Code: [email or web address]  URL: [email or web address]  Date: 03/19/2018  Prepared by: Elly Modena   Exercises  Supine Shoulder Flexion with Dowel - 10 reps - 3 sets - 1x daily - 7x weekly  Seated Scapular Retraction - 10 reps - 3 sets - 1x daily - 7x weekly  Seated Shoulder Rolls - 10 reps - 3 sets - 1x daily - 7x weekly  Seated Hip Abduction - 10 reps - 3 sets - 1x daily - 7x weekly   [facility] [address] [place], [place] [address] Phone # [phone] Fax [phone]

## 2018-03-19 NOTE — Telephone Encounter (Signed)
Spoke with pt regarding home health PT services and she confirmed that she is no longer receiving home health PT so that she can have OPPT at this time. She confirmed her evaluation time for today.   8:12 AM,03/19/18 [place], [place] at [facility]
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
## 2018-03-20 ENCOUNTER — Other Ambulatory Visit: Payer: Self-pay

## 2018-03-20 DIAGNOSIS — C9000 Multiple myeloma not having achieved remission: Secondary | ICD-10-CM

## 2018-03-20 MED ORDER — LENALIDOMIDE 5 MG PO CAPS: 5.0000 mg | ORAL_CAPSULE | Freq: Every day | ORAL | 0 refills | Status: DC

## 2018-03-26 ENCOUNTER — Other Ambulatory Visit: Payer: Self-pay | Admitting: Nurse Practitioner

## 2018-03-26 ENCOUNTER — Encounter: Payer: Self-pay | Admitting: Physical Therapy

## 2018-03-26 ENCOUNTER — Ambulatory Visit: Payer: Medicare Other | Admitting: Physical Therapy

## 2018-03-26 DIAGNOSIS — M6281 Muscle weakness (generalized): Secondary | ICD-10-CM

## 2018-03-26 DIAGNOSIS — M25611 Stiffness of right shoulder, not elsewhere classified: Secondary | ICD-10-CM

## 2018-03-26 DIAGNOSIS — M25511 Pain in right shoulder: Secondary | ICD-10-CM

## 2018-03-26 DIAGNOSIS — C9000 Multiple myeloma not having achieved remission: Secondary | ICD-10-CM

## 2018-03-26 DIAGNOSIS — M25551 Pain in right hip: Secondary | ICD-10-CM

## 2018-03-26 NOTE — Therapy (Signed)
[facility] [address], [place], [place], [address] Phone: [phone]   Fax:  [phone]  Physical Therapy Treatment  Patient Details  Name: Kristin [name] MRN: [id number] Date of Birth: 1931-01-30 Referring Provider: Yaakov Guthrie, MD    Encounter Date: 03/26/2018  PT End of Session - 03/26/18 1403    Visit Number  2    Date for PT Re-Evaluation  05/19/18    Authorization Type  Med pay    Authorization Time Period  03/19/18 to 05/19/18    Authorization - Visit Number  2    Authorization - Number of Visits  10    PT Start Time  1402    PT Stop Time  1444    PT Time Calculation (min)  42 min    Activity Tolerance  Patient tolerated treatment well;No increased pain    Behavior During Therapy  WFL for tasks assessed/performed       Past Medical History:  Diagnosis Date  . Bone cancer [facility])    "being tx'd by Dr. Burr Medico @ [name]" (10/09/2017)  . Chronic kidney disease (CKD), stage III (moderate) (HCC)   . Depression    Risperdal "twice/day" (09/23/2016)  . Glaucoma   . Hiatal hernia   . Hypertension   . Intractable back pain    /notes 09/23/2016  . Obesity   . Osteoarthritis    "all over" (10/09/2017)  . Osteoporosis    vertebral fracture in 2012  . Peripheral vascular disease ([place])   . Upper GI bleed 04/2004   secondary to Mallory-Weiss tear/notes 12/28/2010  . Vertigo     Past Surgical History:  Procedure Laterality Date  . ABDOMINAL HYSTERECTOMY  1974   partial  . APPENDECTOMY     as a child/notes 12/28/2010  . BACK SURGERY    . BREAST BIOPSY Left 1999   Archie Endo 12/28/2010  . CATARACT EXTRACTION W/ INTRAOCULAR LENS  IMPLANT, BILATERAL Bilateral 2000s  . ESOPHAGOGASTRODUODENOSCOPY  04/2004   Archie Endo 12/28/2010  . FIXATION KYPHOPLASTY THORACIC SPINE  05/2011   T10/notes 05/25/2011  . FRACTURE SURGERY    . HIP ARTHROPLASTY Right 01/19/2017   Procedure: RIGHT HIP HEMIARTHROPLASTY;  Surgeon: Renette Butters, MD;  Location: [place];  Service: Orthopedics;  Laterality: Right;  . LAPAROSCOPIC CHOLECYSTECTOMY  10/2007   Archie Endo 12/14/2010  . LOWER EXTREMITY ANGIOGRAPHY N/A 10/09/2017   Procedure: LOWER EXTREMITY ANGIOGRAPHY;  Surgeon: Lorretta Harp, MD;  Location: [place] [facility];  Service: Cardiovascular;  Laterality: N/A;  bilateral  . LOWER EXTREMITY ANGIOGRAPHY  11/21/2017   Procedure: Lower Extremity Angiography;  Surgeon: Adrian Prows, MD;  Location: [place] [facility];  Service: Cardiovascular;;  . PERIPHERAL VASCULAR BALLOON ANGIOPLASTY  10/09/2017   Attempted PTA Archie Endo 10/09/2017  . PERIPHERAL VASCULAR BALLOON ANGIOPLASTY  10/09/2017   Procedure: PERIPHERAL VASCULAR BALLOON ANGIOPLASTY;  Surgeon: Lorretta Harp, MD;  Location: [place] [facility];  Service: Cardiovascular;;  Attempted PTA     There were no vitals filed for this visit.  Subjective Assessment - 03/26/18 1405    Subjective  i had pain earlier today but not right now. Early this morning she reports having right side pain.    Pertinent History  back surgery, Rt THA (posterior), multiple myeloma    Currently in Pain?  No/denies    Multiple Pain Sites  No         [facility] PT Assessment - 03/26/18 0001  Observation/Other Assessments   Focus on Therapeutic Outcomes (FOTO)   58% limitation                   [facility] Adult PT Treatment/Exercise - 03/26/18 0001      Lumbar Exercises: Aerobic   Nustep  L1 x 5 min   PTA present to monitor while getting HEP, pt used UE/AAROM     Shoulder Exercises: Seated   Retraction  Strengthening;Both;10 reps    Flexion  AAROM;Both;10 reps   fingers clasped   Other Seated Exercises  shoulder rolls x10 reps (therapist cuing for proper technique)    Other Seated Exercises  UE ranger on floor: 2x10 fwd/bkwrd: circles each way 10x       Shoulder Exercises: Standing   Other Standing Exercises  5 cones to th efirst shelf 2 sets.    Pain in Rt upper arm     Shoulder  Exercises: Pulleys   Flexion  2 minutes   PTA reviewed status and FOTO concurrently   Scaption  2 minutes   FOTO concurrently            PT Education - 03/26/18 1433    Education Details  Hands clapsed forward flexion for HEP    Person(s) Educated  Patient    Methods  Explanation;Demonstration;Tactile cues;Verbal cues;Handout    Comprehension  Returned demonstration;Verbalized understanding       PT Short Term Goals - 03/19/18 1216      PT SHORT TERM GOAL #1   Title  Pt will demo consistency and independence with HEP to improve ROM and decrease pain.    Time  4    Period  Weeks    Status  New    Target Date  04/16/18      PT SHORT TERM GOAL #2   Title  Pt will demo improved Rt shoulder flexion active ROM to atleast 120 deg which will assist with her ability to reach over head into her cabinet.    Time  4    Period  Weeks    Status  New      PT SHORT TERM GOAL #3   Title  Pt will report atleast 30% improvement in her pain from the start of PT.    Time  4    Period  Weeks    Status  New        PT Long Term Goals - 03/19/18 1218      PT LONG TERM GOAL #1   Title  Pt will complete 5x sit to stand in less than 14 sec without UE assistance, to reflect increase in LE strength and power.    Time  8    Period  Weeks    Status  New    Target Date  05/19/18      PT LONG TERM GOAL #2   Title  Pt will demo improved Rt shoulder flexion active ROM to atleast 140 deg which will allow her to reach overhead during daily activity.    Time  8    Period  Weeks    Status  New      PT LONG TERM GOAL #3   Title  Pt will report atleast 60% improvement in her shoulder and hip pain from the start of PT which will improve her quality of life and decrease the need for caregiver support.     Time  8    Period  Weeks    Status  New      PT LONG TERM GOAL #4   Title  Pt will report being able to pick up a small pot of tea without the need for caregiver assistance.     Time  8     Period  Weeks    Status  New            Plan - 03/26/18 1404    Clinical Impression Statement  Pt reports she didn't get to do much HEP because she had family and other visitors in the last few days. She did present today with no complaints of pain. The only exercise she had shoulder pain was cones lifted to first shelf. She was given new HEP for hands clasped together and lifting her arms as high as she could. This was done pain free.  She scored 58% limitation on FOTO and was documented.     Rehab Potential  Good    PT Frequency  2x / week    PT Duration  8 weeks    PT Next Visit Plan  Shoulder ROM, Nustep for LE/hip strength with the arms, begin shoulder isometrics, pain control if needed.     PT Home Exercise Plan  [id number]    Consulted and Agree with Plan of Care  Patient       Patient will benefit from skilled therapeutic intervention in order to improve the following deficits and impairments:  Abnormal gait, Decreased activity tolerance, Decreased strength, Impaired flexibility, Impaired UE functional use, Postural dysfunction, Pain, Improper body mechanics, Decreased range of motion, Difficulty walking, Decreased endurance, Increased muscle spasms, Hypomobility  Visit Diagnosis: Right shoulder pain, unspecified chronicity  Pain in right hip  Muscle weakness (generalized)  Stiffness of right shoulder, not elsewhere classified     Problem List Patient Active Problem List   Diagnosis Date Noted  . Critical lower limb ischemia 10/04/2017  . Advance care planning   . Goals of care, counseling/discussion   . Palliative care by specialist   . CKD (chronic kidney disease), stage III ([place]) 01/18/2017  . Hip fracture, pathological ([place]) 01/18/2017  . Pathologic hip fracture ([place]) 01/18/2017  . DNR (do not resuscitate) discussion 11/01/2016  . Multiple myeloma ([place]) 10/31/2016  . Acute encephalopathy 10/18/2016  . Hypercalcemia 10/18/2016  . Fall   . Anemia in  neoplastic disease 09/23/2016  . Sepsis ([place]) 09/23/2016  . Back pain 09/23/2016  . Intractable back pain 06/20/2016  . Other malaise and fatigue 11/26/2013  . Vertigo   . Obesity   . Arthritis of knee, left 07/09/2013  . Need for prophylactic vaccination and inoculation against influenza 07/09/2013  . Osteoporosis with pathological fracture 01/01/2013  . HLD (hyperlipidemia) 01/01/2013  . HTN (hypertension) 01/01/2013  . Chronic back pain 01/01/2013  . Vertebral compression fracture ([place]) 01/01/2013  . Seasonal allergic rhinitis 01/01/2013    COCHRAN,JENNIFER, PTA 03/26/2018, 2:51 PM  [facility] [address] [place], [place], [address] Phone: [phone]   Fax:  [phone]  Name: Kristin [name] MRN: [id number] Date of Birth: 1930-11-15  Access Code: [email or web address]  URL: [email or web address]  Date: 03/26/2018  Prepared by: Myrene Galas   Exercises  Supine Shoulder Flexion with [place] - 10 reps - 3 sets - 1x daily - 7x weekly  Seated Scapular Retraction - 10 reps - 3 sets - 1x daily - 7x weekly  Seated Shoulder Rolls - 10 reps - 3 sets - 1x daily - 7x weekly  Seated  Hip Abduction - 10 reps - 3 sets - 1x daily - 7x weekly  Seated Shoulder Flexion AAROM with Hemiparetic UE - 10 reps - 1 sets - 3x daily - 7x weekly

## 2018-03-27 ENCOUNTER — Other Ambulatory Visit: Payer: Self-pay | Admitting: Hematology

## 2018-03-27 ENCOUNTER — Encounter: Payer: Self-pay | Admitting: Hematology

## 2018-03-27 ENCOUNTER — Inpatient Hospital Stay (HOSPITAL_BASED_OUTPATIENT_CLINIC_OR_DEPARTMENT_OTHER): Payer: Medicare Other | Admitting: Hematology

## 2018-03-27 ENCOUNTER — Inpatient Hospital Stay: Payer: Medicare Other | Attending: Hematology

## 2018-03-27 ENCOUNTER — Inpatient Hospital Stay: Payer: Medicare Other

## 2018-03-27 ENCOUNTER — Telehealth: Payer: Self-pay | Admitting: Hematology

## 2018-03-27 VITALS — BP 136/61 | HR 68 | Temp 98.2°F | Resp 15 | Ht 59.0 in | Wt 136.1 lb

## 2018-03-27 DIAGNOSIS — R944 Abnormal results of kidney function studies: Secondary | ICD-10-CM

## 2018-03-27 DIAGNOSIS — I739 Peripheral vascular disease, unspecified: Secondary | ICD-10-CM | POA: Insufficient documentation

## 2018-03-27 DIAGNOSIS — Z79899 Other long term (current) drug therapy: Secondary | ICD-10-CM | POA: Insufficient documentation

## 2018-03-27 DIAGNOSIS — M87180 Osteonecrosis due to drugs, jaw: Secondary | ICD-10-CM | POA: Insufficient documentation

## 2018-03-27 DIAGNOSIS — E669 Obesity, unspecified: Secondary | ICD-10-CM

## 2018-03-27 DIAGNOSIS — M810 Age-related osteoporosis without current pathological fracture: Secondary | ICD-10-CM | POA: Insufficient documentation

## 2018-03-27 DIAGNOSIS — K449 Diaphragmatic hernia without obstruction or gangrene: Secondary | ICD-10-CM

## 2018-03-27 DIAGNOSIS — Z8781 Personal history of (healed) traumatic fracture: Secondary | ICD-10-CM | POA: Diagnosis not present

## 2018-03-27 DIAGNOSIS — R531 Weakness: Secondary | ICD-10-CM

## 2018-03-27 DIAGNOSIS — Z7982 Long term (current) use of aspirin: Secondary | ICD-10-CM

## 2018-03-27 DIAGNOSIS — C9000 Multiple myeloma not having achieved remission: Secondary | ICD-10-CM

## 2018-03-27 DIAGNOSIS — Z7901 Long term (current) use of anticoagulants: Secondary | ICD-10-CM

## 2018-03-27 DIAGNOSIS — I129 Hypertensive chronic kidney disease with stage 1 through stage 4 chronic kidney disease, or unspecified chronic kidney disease: Secondary | ICD-10-CM

## 2018-03-27 DIAGNOSIS — N183 Chronic kidney disease, stage 3 (moderate): Secondary | ICD-10-CM | POA: Diagnosis not present

## 2018-03-27 DIAGNOSIS — L03211 Cellulitis of face: Secondary | ICD-10-CM

## 2018-03-27 DIAGNOSIS — D696 Thrombocytopenia, unspecified: Secondary | ICD-10-CM | POA: Diagnosis not present

## 2018-03-27 DIAGNOSIS — M1990 Unspecified osteoarthritis, unspecified site: Secondary | ICD-10-CM | POA: Diagnosis not present

## 2018-03-27 DIAGNOSIS — Z9221 Personal history of antineoplastic chemotherapy: Secondary | ICD-10-CM

## 2018-03-27 DIAGNOSIS — Z9181 History of falling: Secondary | ICD-10-CM | POA: Insufficient documentation

## 2018-03-27 DIAGNOSIS — G8929 Other chronic pain: Secondary | ICD-10-CM | POA: Insufficient documentation

## 2018-03-27 DIAGNOSIS — K5900 Constipation, unspecified: Secondary | ICD-10-CM | POA: Insufficient documentation

## 2018-03-27 DIAGNOSIS — M549 Dorsalgia, unspecified: Secondary | ICD-10-CM | POA: Diagnosis not present

## 2018-03-27 DIAGNOSIS — D649 Anemia, unspecified: Secondary | ICD-10-CM

## 2018-03-27 DIAGNOSIS — F329 Major depressive disorder, single episode, unspecified: Secondary | ICD-10-CM

## 2018-03-27 LAB — COMPREHENSIVE METABOLIC PANEL
ALK PHOS: 68 U/L (ref 38–126)
ALT: 11 U/L (ref 0–44)
AST: 13 U/L — AB (ref 15–41)
Albumin: 3.1 g/dL — ABNORMAL LOW (ref 3.5–5.0)
Anion gap: 11 (ref 5–15)
BUN: 17 mg/dL (ref 8–23)
CALCIUM: 8.6 mg/dL — AB (ref 8.9–10.3)
CHLORIDE: 108 mmol/L (ref 98–111)
CO2: 23 mmol/L (ref 22–32)
CREATININE: 1.2 mg/dL — AB (ref 0.44–1.00)
GFR, EST AFRICAN AMERICAN: 46 mL/min — AB (ref >60)
GFR, EST NON AFRICAN AMERICAN: 40 mL/min — AB (ref >60)
Glucose, Bld: 123 mg/dL — ABNORMAL HIGH (ref 70–99)
Potassium: 3.6 mmol/L (ref 3.5–5.1)
Sodium: 142 mmol/L (ref 135–145)
Total Bilirubin: 0.6 mg/dL (ref 0.3–1.2)
Total Protein: 6.7 g/dL (ref 6.5–8.1)

## 2018-03-27 LAB — CBC WITH DIFFERENTIAL/PLATELET
Basophils Absolute: 0 10*3/uL (ref 0.0–0.1)
Basophils Relative: 1 %
EOS ABS: 0.2 10*3/uL (ref 0.0–0.5)
EOS PCT: 6 %
HCT: 31.5 % — ABNORMAL LOW (ref 34.8–46.6)
Hemoglobin: 10.4 g/dL — ABNORMAL LOW (ref 11.6–15.9)
LYMPHS ABS: 1 10*3/uL (ref 0.9–3.3)
Lymphocytes Relative: 25 %
MCH: 33.1 pg (ref 25.1–34.0)
MCHC: 33.1 g/dL (ref 31.5–36.0)
MCV: 100.2 fL (ref 79.5–101.0)
MONOS PCT: 6 %
Monocytes Absolute: 0.2 10*3/uL (ref 0.1–0.9)
Neutro Abs: 2.4 10*3/uL (ref 1.5–6.5)
Neutrophils Relative %: 62 %
PLATELETS: 244 10*3/uL (ref 145–400)
RBC: 3.15 MIL/uL — AB (ref 3.70–5.45)
RDW: 15 % — ABNORMAL HIGH (ref 11.2–14.5)
WBC: 3.9 10*3/uL (ref 3.9–10.3)

## 2018-03-27 MED ORDER — DENOSUMAB 120 MG/1.7ML ~~LOC~~ SOLN
120.0000 mg | Freq: Once | SUBCUTANEOUS | Status: AC
  Administered 2018-03-27: 120 mg via SUBCUTANEOUS

## 2018-03-27 MED ORDER — DENOSUMAB 120 MG/1.7ML ~~LOC~~ SOLN
SUBCUTANEOUS | Status: AC
  Filled 2018-03-27: qty 1.7

## 2018-03-27 MED ORDER — MORPHINE SULFATE ER 30 MG PO TBCR: 30.0000 mg | EXTENDED_RELEASE_TABLET | Freq: Two times a day (BID) | ORAL | 0 refills | Status: DC

## 2018-03-27 MED ORDER — OXYCODONE HCL 10 MG PO TABS: 10.0000 mg | ORAL_TABLET | Freq: Four times a day (QID) | ORAL | 0 refills | Status: DC | PRN
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
## 2018-03-27 NOTE — Telephone Encounter (Signed)
appts already scheduled per 8/13 los - no additional appts added.

## 2018-03-28 ENCOUNTER — Inpatient Hospital Stay: Payer: Medicare Other

## 2018-03-28 ENCOUNTER — Inpatient Hospital Stay: Payer: Medicare Other | Admitting: Hematology

## 2018-03-28 LAB — KAPPA/LAMBDA LIGHT CHAINS
KAPPA FREE LGHT CHN: 59.8 mg/L — AB (ref 3.3–19.4)
Kappa, lambda light chain ratio: 1 (ref 0.26–1.65)
Lambda free light chains: 59.7 mg/L — ABNORMAL HIGH (ref 5.7–26.3)

## 2018-03-29 ENCOUNTER — Encounter: Payer: Self-pay | Admitting: Physical Therapy

## 2018-03-29 ENCOUNTER — Ambulatory Visit: Payer: Medicare Other | Admitting: Physical Therapy

## 2018-03-29 DIAGNOSIS — M6281 Muscle weakness (generalized): Secondary | ICD-10-CM

## 2018-03-29 DIAGNOSIS — M25551 Pain in right hip: Secondary | ICD-10-CM

## 2018-03-29 DIAGNOSIS — M25511 Pain in right shoulder: Secondary | ICD-10-CM | POA: Diagnosis not present

## 2018-03-29 DIAGNOSIS — M25611 Stiffness of right shoulder, not elsewhere classified: Secondary | ICD-10-CM

## 2018-03-29 LAB — MULTIPLE MYELOMA PANEL, SERUM
Albumin SerPl Elph-Mcnc: 3.1 g/dL (ref 2.9–4.4)
Albumin/Glob SerPl: 1.1 (ref 0.7–1.7)
Alpha 1: 0.2 g/dL (ref 0.0–0.4)
Alpha2 Glob SerPl Elph-Mcnc: 0.6 g/dL (ref 0.4–1.0)
B-GLOBULIN SERPL ELPH-MCNC: 0.9 g/dL (ref 0.7–1.3)
Gamma Glob SerPl Elph-Mcnc: 1.3 g/dL (ref 0.4–1.8)
Globulin, Total: 3.1 g/dL (ref 2.2–3.9)
IGA: 529 mg/dL — AB (ref 64–422)
IgG (Immunoglobin G), Serum: 1370 mg/dL (ref 700–1600)
IgM (Immunoglobulin M), Srm: 20 mg/dL — ABNORMAL LOW (ref 26–217)
TOTAL PROTEIN ELP: 6.2 g/dL (ref 6.0–8.5)

## 2018-03-29 NOTE — Therapy (Signed)
[facility] [address], [place], [place], [address] Phone: [phone]   Fax:  [phone]  Physical Therapy Treatment  Patient Details  Name: Kristin [name] MRN: [id number] Date of Birth: Aug 12, 1931 Referring Provider: Yaakov Guthrie, MD    Encounter Date: 03/29/2018  PT End of Session - 03/29/18 1441    Visit Number  3    Date for PT Re-Evaluation  05/19/18    Authorization Type  Med pay    Authorization Time Period  03/19/18 to 05/19/18    Authorization - Visit Number  3    Authorization - Number of Visits  10    PT Start Time  3903    PT Stop Time  1444    PT Time Calculation (min)  38 min    Activity Tolerance  Patient tolerated treatment well;No increased pain    Behavior During Therapy  WFL for tasks assessed/performed       Past Medical History:  Diagnosis Date  . Bone cancer [facility])    "being tx'd by Dr. Burr Medico @ [facility]" (10/09/2017)  . Chronic kidney disease (CKD), stage III (moderate) (HCC)   . Depression    Risperdal "twice/day" (09/23/2016)  . Glaucoma   . Hiatal hernia   . Hypertension   . Intractable back pain    /notes 09/23/2016  . Obesity   . Osteoarthritis    "all over" (10/09/2017)  . Osteoporosis    vertebral fracture in 2012  . Peripheral vascular disease ([place])   . Upper GI bleed 04/2004   secondary to Mallory-Weiss tear/notes 12/28/2010  . Vertigo     Past Surgical History:  Procedure Laterality Date  . ABDOMINAL HYSTERECTOMY  1974   partial  . APPENDECTOMY     as a child/notes 12/28/2010  . BACK SURGERY    . BREAST BIOPSY Left 1999   Archie Endo 12/28/2010  . CATARACT EXTRACTION W/ INTRAOCULAR LENS  IMPLANT, BILATERAL Bilateral 2000s  . ESOPHAGOGASTRODUODENOSCOPY  04/2004   Archie Endo 12/28/2010  . FIXATION KYPHOPLASTY THORACIC SPINE  05/2011   T10/notes 05/25/2011  . FRACTURE SURGERY    . HIP ARTHROPLASTY Right 01/19/2017   Procedure: RIGHT HIP HEMIARTHROPLASTY;  Surgeon: Renette Butters, MD;  Location: [place];  Service: Orthopedics;  Laterality: Right;  . LAPAROSCOPIC CHOLECYSTECTOMY  10/2007   Archie Endo 12/14/2010  . LOWER EXTREMITY ANGIOGRAPHY N/A 10/09/2017   Procedure: LOWER EXTREMITY ANGIOGRAPHY;  Surgeon: Lorretta Harp, MD;  Location: [place] [facility];  Service: Cardiovascular;  Laterality: N/A;  bilateral  . LOWER EXTREMITY ANGIOGRAPHY  11/21/2017   Procedure: Lower Extremity Angiography;  Surgeon: Adrian Prows, MD;  Location: [place] [facility];  Service: Cardiovascular;;  . PERIPHERAL VASCULAR BALLOON ANGIOPLASTY  10/09/2017   Attempted PTA Archie Endo 10/09/2017  . PERIPHERAL VASCULAR BALLOON ANGIOPLASTY  10/09/2017   Procedure: PERIPHERAL VASCULAR BALLOON ANGIOPLASTY;  Surgeon: Lorretta Harp, MD;  Location: [place] [facility];  Service: Cardiovascular;;  Attempted PTA     There were no vitals filed for this visit.  Subjective Assessment - 03/29/18 1428    Subjective  Pt reports that she has no pain currently, but her shoulder will bother her when she lifts things throughout the day.    Pertinent History  back surgery, Rt THA (posterior), multiple myeloma    Currently in Pain?  No/denies         [facility] PT Assessment - 03/29/18 0001      AROM   Overall AROM  Comments  Rt shoulder scaption 120 deg                    [facility] Adult PT Treatment/Exercise - 03/29/18 0001      Lumbar Exercises: Aerobic   Nustep  L1 x10 PT present to discuss HEP adherence and encourage SPM above 40      Shoulder Exercises: Supine   Flexion  Both;10 reps;Limitations    Flexion Limitations  x2 sets on incline, 2# bar     Shoulder Exercises: Seated   Retraction  Both;20 reps   heavy cuing for technique   Other Seated Exercises  BUE bicep curls with 2# 2x10 reps; BUE shoulder press with 2# 2x10 reps     Other Seated Exercises  UE on floor x20 reps protraction, x20 reps horizontal abduction      Shoulder Exercises: Pulleys   Flexion  2 minutes    Scaption  2  minutes             PT Education - 03/29/18 1441    Education Details  technique with therex    Person(s) Educated  Patient    Methods  Verbal cues;Tactile cues;Explanation    Comprehension  Verbalized understanding;Need further instruction       PT Short Term Goals - 03/29/18 1442      PT SHORT TERM GOAL #1   Title  Pt will demo consistency and independence with HEP to improve ROM and decrease pain.    Time  4    Period  Weeks    Status  On-going      PT SHORT TERM GOAL #2   Title  Pt will demo improved Rt shoulder flexion active ROM to atleast 120 deg which will assist with her ability to reach over head into her cabinet.    Time  4    Period  Weeks    Status  New      PT SHORT TERM GOAL #3   Title  Pt will report atleast 30% improvement in her pain from the start of PT.    Time  4    Period  Weeks    Status  New        PT Long Term Goals - 03/19/18 1218      PT LONG TERM GOAL #1   Title  Pt will complete 5x sit to stand in less than 14 sec without UE assistance, to reflect increase in LE strength and power.    Time  8    Period  Weeks    Status  New    Target Date  05/19/18      PT LONG TERM GOAL #2   Title  Pt will demo improved Rt shoulder flexion active ROM to atleast 140 deg which will allow her to reach overhead during daily activity.    Time  8    Period  Weeks    Status  New      PT LONG TERM GOAL #3   Title  Pt will report atleast 60% improvement in her shoulder and hip pain from the start of PT which will improve her quality of life and decrease the need for caregiver support.     Time  8    Period  Weeks    Status  New      PT LONG TERM GOAL #4   Title  Pt will report being able to pick up a small pot of tea without the  need for caregiver assistance.     Time  8    Period  Weeks    Status  New            Plan - 03/29/18 1508    Clinical Impression Statement  Pt is making progress towards her goals, with up to 120 deg of active  Rt shoulder scaption this session. She denies pain, but does note discomfort still with activity during the day. Therapist introduced resistance exercises and monitored pt's pain. She was encouraged to increase HEP adherence and verbalized understanding of this. Will continue with current POC.     Rehab Potential  Good    PT Frequency  2x / week    PT Duration  8 weeks    PT Next Visit Plan  Shoulder ROM, Nustep for LE/hip strength with the arms, shoulder isometrics, pain control if needed.     PT Home Exercise Plan  [id number]    Consulted and Agree with Plan of Care  Patient       Patient will benefit from skilled therapeutic intervention in order to improve the following deficits and impairments:  Abnormal gait, Decreased activity tolerance, Decreased strength, Impaired flexibility, Impaired UE functional use, Postural dysfunction, Pain, Improper body mechanics, Decreased range of motion, Difficulty walking, Decreased endurance, Increased muscle spasms, Hypomobility  Visit Diagnosis: Right shoulder pain, unspecified chronicity  Pain in right hip  Muscle weakness (generalized)  Stiffness of right shoulder, not elsewhere classified     Problem List Patient Active Problem List   Diagnosis Date Noted  . Critical lower limb ischemia 10/04/2017  . Advance care planning   . Goals of care, counseling/discussion   . Palliative care by specialist   . CKD (chronic kidney disease), stage III ([place]) 01/18/2017  . Hip fracture, pathological ([place]) 01/18/2017  . Pathologic hip fracture ([place]) 01/18/2017  . DNR (do not resuscitate) discussion 11/01/2016  . Multiple myeloma ([place]) 10/31/2016  . Acute encephalopathy 10/18/2016  . Hypercalcemia 10/18/2016  . Fall   . Anemia in neoplastic disease 09/23/2016  . Sepsis ([place]) 09/23/2016  . Back pain 09/23/2016  . Intractable back pain 06/20/2016  . Other malaise and fatigue 11/26/2013  . Vertigo   . Obesity   . Arthritis of knee, left 07/09/2013   . Need for prophylactic vaccination and inoculation against influenza 07/09/2013  . Osteoporosis with pathological fracture 01/01/2013  . HLD (hyperlipidemia) 01/01/2013  . HTN (hypertension) 01/01/2013  . Chronic back pain 01/01/2013  . Vertebral compression fracture ([place]) 01/01/2013  . Seasonal allergic rhinitis 01/01/2013   3:30 PM,03/29/18 Sherol Dade PT, DPT [facility] [address] [place], [place], [address] Phone: [phone]   Fax:  [phone]  Name: Kristin [name] MRN: [id number] Date of Birth: Apr 23, 1931

## 2018-03-30 ENCOUNTER — Other Ambulatory Visit: Payer: Self-pay | Admitting: Family Medicine

## 2018-03-30 ENCOUNTER — Ambulatory Visit
Admission: RE | Admit: 2018-03-30 | Discharge: 2018-03-30 | Disposition: A | Discharge status: Home / Self Care | Payer: Medicare Other | Source: Ambulatory Visit | Attending: Family Medicine | Admitting: Family Medicine

## 2018-03-30 DIAGNOSIS — R609 Edema, unspecified: Secondary | ICD-10-CM

## 2018-03-30 DIAGNOSIS — R52 Pain, unspecified: Secondary | ICD-10-CM

## 2018-04-02 ENCOUNTER — Ambulatory Visit: Payer: Medicare Other | Admitting: Physical Therapy

## 2018-04-02 ENCOUNTER — Encounter: Payer: Self-pay | Admitting: Physical Therapy

## 2018-04-02 DIAGNOSIS — M25611 Stiffness of right shoulder, not elsewhere classified: Secondary | ICD-10-CM

## 2018-04-02 DIAGNOSIS — M25551 Pain in right hip: Secondary | ICD-10-CM

## 2018-04-02 DIAGNOSIS — M6281 Muscle weakness (generalized): Secondary | ICD-10-CM

## 2018-04-02 DIAGNOSIS — M25511 Pain in right shoulder: Secondary | ICD-10-CM | POA: Diagnosis not present

## 2018-04-02 NOTE — Therapy (Signed)
[facility] [address] [place], [place], [address] Phone: [phone]   Fax:  [phone]  Physical Therapy Treatment  Patient Details  Name: Kristin [name] MRN: [id number] Date of Birth: May 27, 1931 Referring Provider: Yaakov Guthrie, MD    Encounter Date: 04/02/2018  PT End of Session - 04/02/18 1100    Visit Number  4    Date for PT Re-Evaluation  05/19/18    Authorization Type  Med pay    Authorization Time Period  03/19/18 to 05/19/18    Authorization - Visit Number  4    Authorization - Number of Visits  10    PT Start Time  1100    PT Stop Time  1143    PT Time Calculation (min)  43 min    Activity Tolerance  Patient tolerated treatment well;No increased pain    Behavior During Therapy  WFL for tasks assessed/performed       Past Medical History:  Diagnosis Date  . Bone cancer [facility])    "being tx'd by Dr. Burr Medico @ [facility]" (10/09/2017)  . Chronic kidney disease (CKD), stage III (moderate) (HCC)   . Depression    Risperdal "twice/day" (09/23/2016)  . Glaucoma   . Hiatal hernia   . Hypertension   . Intractable back pain    /notes 09/23/2016  . Obesity   . Osteoarthritis    "all over" (10/09/2017)  . Osteoporosis    vertebral fracture in 2012  . Peripheral vascular disease ([place])   . Upper GI bleed 04/2004   secondary to Mallory-Weiss tear/notes 12/28/2010  . Vertigo     Past Surgical History:  Procedure Laterality Date  . ABDOMINAL HYSTERECTOMY  1974   partial  . APPENDECTOMY     as a child/notes 12/28/2010  . BACK SURGERY    . BREAST BIOPSY Left 1999   Archie Endo 12/28/2010  . CATARACT EXTRACTION W/ INTRAOCULAR LENS  IMPLANT, BILATERAL Bilateral 2000s  . ESOPHAGOGASTRODUODENOSCOPY  04/2004   Archie Endo 12/28/2010  . FIXATION KYPHOPLASTY THORACIC SPINE  05/2011   T10/notes 05/25/2011  . FRACTURE SURGERY    . HIP ARTHROPLASTY Right 01/19/2017   Procedure: RIGHT HIP HEMIARTHROPLASTY;  Surgeon: Renette Butters, MD;  Location: [place];  Service: Orthopedics;  Laterality: Right;  . LAPAROSCOPIC CHOLECYSTECTOMY  10/2007   Archie Endo 12/14/2010  . LOWER EXTREMITY ANGIOGRAPHY N/A 10/09/2017   Procedure: LOWER EXTREMITY ANGIOGRAPHY;  Surgeon: Lorretta Harp, MD;  Location: [place] [facility];  Service: Cardiovascular;  Laterality: N/A;  bilateral  . LOWER EXTREMITY ANGIOGRAPHY  11/21/2017   Procedure: Lower Extremity Angiography;  Surgeon: Adrian Prows, MD;  Location: [place] [facility];  Service: Cardiovascular;;  . PERIPHERAL VASCULAR BALLOON ANGIOPLASTY  10/09/2017   Attempted PTA Archie Endo 10/09/2017  . PERIPHERAL VASCULAR BALLOON ANGIOPLASTY  10/09/2017   Procedure: PERIPHERAL VASCULAR BALLOON ANGIOPLASTY;  Surgeon: Lorretta Harp, MD;  Location: [place] [facility];  Service: Cardiovascular;;  Attempted PTA     There were no vitals filed for this visit.  Subjective Assessment - 04/02/18 1106    Subjective  I feel better this week than last week. Pt seems to be moving slower today but denies any significant pain.     Currently in Pain?  Yes    Pain Score  3     Pain Location  --   RT side   Pain Descriptors / Indicators  Dull    Aggravating Factors   Not sure  [facility] Adult PT Treatment/Exercise - 04/02/18 0001      Lumbar Exercises: Aerobic   Nustep  L1 x 10 min   PTA present to discuss status/progress     Knee/Hip Exercises: Seated   Sit to Sand  2 sets;10 reps;without UE support      Shoulder Exercises: Seated   Flexion  --   hands clasped, 2# on Rt wrist 2x10   Other Seated Exercises  BUE bicep curls with 2# 2x10 reps; BUE shoulder press with 2# 2x10 reps     Other Seated Exercises  UE ranger on floor: 2x10 fwd/bkwrd: circles each way 20x       Shoulder Exercises: Pulleys   Flexion  2 minutes    Scaption  2 minutes               PT Short Term Goals - 03/29/18 1442      PT SHORT TERM GOAL #1   Title  Pt will demo consistency and  independence with HEP to improve ROM and decrease pain.    Time  4    Period  Weeks    Status  On-going      PT SHORT TERM GOAL #2   Title  Pt will demo improved Rt shoulder flexion active ROM to atleast 120 deg which will assist with her ability to reach over head into her cabinet.    Time  4    Period  Weeks    Status  New      PT SHORT TERM GOAL #3   Title  Pt will report atleast 30% improvement in her pain from the start of PT.    Time  4    Period  Weeks    Status  New        PT Long Term Goals - 04/02/18 1133      PT LONG TERM GOAL #1   Title  Pt will complete 5x sit to stand in less than 14 sec without UE assistance, to reflect increase in LE strength and power.    Time  8    Period  Weeks    Status  Achieved   13 sec           Plan - 04/02/18 1100    Clinical Impression Statement  Pt able to demonstarte sit to stand 5x in 13 seconds today, meeting long term goal. Today we worked on functional activity tolerance by increasing her reps vs weight. Exercises did not increase any pain.  She did mention an xray was taken under her chin that may be Cancer.     Rehab Potential  Good    PT Frequency  2x / week    PT Duration  8 weeks    PT Treatment/Interventions  ADLs/Self Care Home Management;Moist Heat;Electrical Stimulation;Cryotherapy;Therapeutic exercise;Therapeutic activities;Functional mobility training;Balance training;Neuromuscular re-education;Patient/family education;Manual techniques;Passive range of motion;Dry needling;Taping    PT Next Visit Plan  Shoulder ROM, Nustep for LE/hip strength with the arms, shoulder isometrics, pain control if needed. Measure shoulder ROM for goals.    PT Home Exercise Plan  [id number]    Consulted and Agree with Plan of Care  Patient       Patient will benefit from skilled therapeutic intervention in order to improve the following deficits and impairments:  Abnormal gait, Decreased activity tolerance, Decreased strength,  Impaired flexibility, Impaired UE functional use, Postural dysfunction, Pain, Improper body mechanics, Decreased range of motion, Difficulty walking, Decreased endurance, Increased muscle spasms, Hypomobility  Visit Diagnosis: Right shoulder pain, unspecified chronicity  Pain in right hip  Muscle weakness (generalized)  Stiffness of right shoulder, not elsewhere classified     Problem List Patient Active Problem List   Diagnosis Date Noted  . Critical lower limb ischemia 10/04/2017  . Advance care planning   . Goals of care, counseling/discussion   . Palliative care by specialist   . CKD (chronic kidney disease), stage III ([place]) 01/18/2017  . Hip fracture, pathological ([place]) 01/18/2017  . Pathologic hip fracture ([place]) 01/18/2017  . DNR (do not resuscitate) discussion 11/01/2016  . Multiple myeloma ([place]) 10/31/2016  . Acute encephalopathy 10/18/2016  . Hypercalcemia 10/18/2016  . Fall   . Anemia in neoplastic disease 09/23/2016  . Sepsis ([place]) 09/23/2016  . Back pain 09/23/2016  . Intractable back pain 06/20/2016  . Other malaise and fatigue 11/26/2013  . Vertigo   . Obesity   . Arthritis of knee, left 07/09/2013  . Need for prophylactic vaccination and inoculation against influenza 07/09/2013  . Osteoporosis with pathological fracture 01/01/2013  . HLD (hyperlipidemia) 01/01/2013  . HTN (hypertension) 01/01/2013  . Chronic back pain 01/01/2013  . Vertebral compression fracture ([place]) 01/01/2013  . Seasonal allergic rhinitis 01/01/2013    Aryaman Haliburton, PTA 04/02/2018, 4:17 PM  [facility] [address], [place], [place], [address] Phone: [phone]   Fax:  [phone]  Name: Kristin [name] MRN: [id number] Date of Birth: [birth year]

## 2018-04-05 ENCOUNTER — Ambulatory Visit: Payer: Medicare Other | Admitting: Physical Therapy

## 2018-04-05 ENCOUNTER — Encounter: Payer: Self-pay | Admitting: Physical Therapy

## 2018-04-05 DIAGNOSIS — M25511 Pain in right shoulder: Secondary | ICD-10-CM | POA: Diagnosis not present

## 2018-04-05 DIAGNOSIS — M25611 Stiffness of right shoulder, not elsewhere classified: Secondary | ICD-10-CM

## 2018-04-05 DIAGNOSIS — M25551 Pain in right hip: Secondary | ICD-10-CM

## 2018-04-05 DIAGNOSIS — M6281 Muscle weakness (generalized): Secondary | ICD-10-CM

## 2018-04-05 NOTE — Therapy (Signed)
[facility] [address] [place], [place], [address] Phone: [phone]   Fax:  [phone]  Physical Therapy Treatment  Patient Details  Name: Kristin [name] MRN: [id number] Date of Birth: [birth year] Referring Provider: Yaakov Guthrie, MD    Encounter Date: 04/05/2018  PT End of Session - 04/05/18 1256    Visit Number  5    Date for PT Re-Evaluation  05/19/18    Authorization Type  Med pay    Authorization Time Period  03/19/18 to 05/19/18    Authorization - Visit Number  5    Authorization - Number of Visits  10    PT Start Time  1230    PT Stop Time  1300    PT Time Calculation (min)  30 min    Activity Tolerance  Patient tolerated treatment well;No increased pain    Behavior During Therapy  WFL for tasks assessed/performed       Past Medical History:  Diagnosis Date  . Bone cancer [facility])    "being tx'd by Dr. Burr Medico @ [facility]" (10/09/2017)  . Chronic kidney disease (CKD), stage III (moderate) (HCC)   . Depression    Risperdal "twice/day" (09/23/2016)  . Glaucoma   . Hiatal hernia   . Hypertension   . Intractable back pain    /notes 09/23/2016  . Obesity   . Osteoarthritis    "all over" (10/09/2017)  . Osteoporosis    vertebral fracture in 2012  . Peripheral vascular disease ([place])   . Upper GI bleed 04/2004   secondary to Mallory-Weiss tear/notes 12/28/2010  . Vertigo     Past Surgical History:  Procedure Laterality Date  . ABDOMINAL HYSTERECTOMY  1974   partial  . APPENDECTOMY     as a child/notes 12/28/2010  . BACK SURGERY    . BREAST BIOPSY Left 1999   Archie Endo 12/28/2010  . CATARACT EXTRACTION W/ INTRAOCULAR LENS  IMPLANT, BILATERAL Bilateral 2000s  . ESOPHAGOGASTRODUODENOSCOPY  04/2004   Archie Endo 12/28/2010  . FIXATION KYPHOPLASTY THORACIC SPINE  05/2011   T10/notes 05/25/2011  . FRACTURE SURGERY    . HIP ARTHROPLASTY Right 01/19/2017   Procedure: RIGHT HIP HEMIARTHROPLASTY;  Surgeon: Renette Butters, MD;  Location: [facility];  Service: Orthopedics;  Laterality: Right;  . LAPAROSCOPIC CHOLECYSTECTOMY  10/2007   Archie Endo 12/14/2010  . LOWER EXTREMITY ANGIOGRAPHY N/A 10/09/2017   Procedure: LOWER EXTREMITY ANGIOGRAPHY;  Surgeon: Lorretta Harp, MD;  Location: [place] [facility];  Service: Cardiovascular;  Laterality: N/A;  bilateral  . LOWER EXTREMITY ANGIOGRAPHY  11/21/2017   Procedure: Lower Extremity Angiography;  Surgeon: Adrian Prows, MD;  Location: [place] [facility];  Service: Cardiovascular;;  . PERIPHERAL VASCULAR BALLOON ANGIOPLASTY  10/09/2017   Attempted PTA Archie Endo 10/09/2017  . PERIPHERAL VASCULAR BALLOON ANGIOPLASTY  10/09/2017   Procedure: PERIPHERAL VASCULAR BALLOON ANGIOPLASTY;  Surgeon: Lorretta Harp, MD;  Location: [place] [facility];  Service: Cardiovascular;;  Attempted PTA     There were no vitals filed for this visit.  Subjective Assessment - 04/05/18 1239    Subjective  Pt states that her jaw and neck are swollen and painful. She has not had any shoulder pain, but her side is a little.     Currently in Pain?  Yes    Pain Score  4     Pain Location  --   Rt side of abdomen   Pain Orientation  Right    Pain Descriptors /  Indicators  Dull    Pain Type  Acute pain    Pain Radiating Towards  none     Pain Onset  More than a month ago    Pain Frequency  Intermittent    Aggravating Factors   not sure     Pain Relieving Factors  unsure                       [facility] Adult PT Treatment/Exercise - 04/05/18 0001      Exercises   Exercises  Lumbar      Lumbar Exercises: Supine   Heel Slides  10 reps    Heel Slides Limitations  Rt    Other Supine Lumbar Exercises  B clamshell with overpressure x20 reps; Rt hip extension isometric 10x3 sec; hooklying hip adduction ball squeeze x20 reps     Other Supine Lumbar Exercises  Rt hip abduction slide x15 reps       Shoulder Exercises: Seated   Flexion  AAROM;Both;10 reps;Limitations    Flexion  Limitations  x2 sets with [facility]    Other Seated Exercises  UE ranger on floor: x20 fwd/bkwrd: circles each way 20x      Shoulder Exercises: Pulleys   Flexion  2 minutes    Scaption  2 minutes             PT Education - 04/05/18 1251    Education Details  technique with therex    Person(s) Educated  Patient    Methods  Explanation    Comprehension  Verbalized understanding       PT Short Term Goals - 03/29/18 1442      PT SHORT TERM GOAL #1   Title  Pt will demo consistency and independence with HEP to improve ROM and decrease pain.    Time  4    Period  Weeks    Status  On-going      PT SHORT TERM GOAL #2   Title  Pt will demo improved Rt shoulder flexion active ROM to atleast 120 deg which will assist with her ability to reach over head into her cabinet.    Time  4    Period  Weeks    Status  New      PT SHORT TERM GOAL #3   Title  Pt will report atleast 30% improvement in her pain from the start of PT.    Time  4    Period  Weeks    Status  New        PT Long Term Goals - 04/02/18 1133      PT LONG TERM GOAL #1   Title  Pt will complete 5x sit to stand in less than 14 sec without UE assistance, to reflect increase in LE strength and power.    Time  8    Period  Weeks    Status  Achieved   13 sec           Plan - 04/05/18 1256    Clinical Impression Statement  Pt arrived with reports of Lt jaw swelling and soreness, noting she has an appointment tomorrow with her physician. She denies any difficulty breathing or swallowing and she was closely monitored throughout today's session. Focused on UE and trunk exercise this session and pt was able to complete all activities without increase in pain. Pt was falling asleep during today's exercises due to lack of sleep last night and requested to end  today's session early.     Rehab Potential  Good    PT Frequency  2x / week    PT Duration  8 weeks    PT Treatment/Interventions  ADLs/Self Care Home  Management;Moist Heat;Electrical Stimulation;Cryotherapy;Therapeutic exercise;Therapeutic activities;Functional mobility training;Balance training;Neuromuscular re-education;Patient/family education;Manual techniques;Passive range of motion;Dry needling;Taping    PT Next Visit Plan  Shoulder ROM, Nustep for LE/hip strength with the arms, shoulder isometrics, pain control if needed. Measure shoulder ROM for goals.    PT Home Exercise Plan  [id number]    Consulted and Agree with Plan of Care  Patient       Patient will benefit from skilled therapeutic intervention in order to improve the following deficits and impairments:  Abnormal gait, Decreased activity tolerance, Decreased strength, Impaired flexibility, Impaired UE functional use, Postural dysfunction, Pain, Improper body mechanics, Decreased range of motion, Difficulty walking, Decreased endurance, Increased muscle spasms, Hypomobility  Visit Diagnosis: Right shoulder pain, unspecified chronicity  Pain in right hip  Muscle weakness (generalized)  Stiffness of right shoulder, not elsewhere classified     Problem List Patient Active Problem List   Diagnosis Date Noted  . Critical lower limb ischemia 10/04/2017  . Advance care planning   . Goals of care, counseling/discussion   . Palliative care by specialist   . CKD (chronic kidney disease), stage III ([place]) 01/18/2017  . Hip fracture, pathological ([place]) 01/18/2017  . Pathologic hip fracture ([place]) 01/18/2017  . DNR (do not resuscitate) discussion 11/01/2016  . Multiple myeloma ([place]) 10/31/2016  . Acute encephalopathy 10/18/2016  . Hypercalcemia 10/18/2016  . Fall   . Anemia in neoplastic disease 09/23/2016  . Sepsis ([place]) 09/23/2016  . Back pain 09/23/2016  . Intractable back pain 06/20/2016  . Other malaise and fatigue 11/26/2013  . Vertigo   . Obesity   . Arthritis of knee, left 07/09/2013  . Need for prophylactic vaccination and inoculation against influenza 07/09/2013   . Osteoporosis with pathological fracture 01/01/2013  . HLD (hyperlipidemia) 01/01/2013  . HTN (hypertension) 01/01/2013  . Chronic back pain 01/01/2013  . Vertebral compression fracture ([place]) 01/01/2013  . Seasonal allergic rhinitis 01/01/2013    1:09 PM,04/05/18 Sherol Dade PT, DPT [facility] [address] [place], [place], [address] Phone: [phone]   Fax:  [phone]  Name: Kristin [name] MRN: [id number] Date of Birth: 1930/12/21

## 2018-04-06 ENCOUNTER — Inpatient Hospital Stay: Payer: Medicare Other | Admitting: Medical

## 2018-04-06 ENCOUNTER — Inpatient Hospital Stay (HOSPITAL_BASED_OUTPATIENT_CLINIC_OR_DEPARTMENT_OTHER): Payer: Medicare Other | Admitting: Medical

## 2018-04-06 ENCOUNTER — Telehealth: Payer: Self-pay | Admitting: Medical

## 2018-04-06 VITALS — BP 147/66 | HR 74 | Temp 99.1°F | Resp 19 | Ht 59.0 in | Wt 129.9 lb

## 2018-04-06 DIAGNOSIS — D649 Anemia, unspecified: Secondary | ICD-10-CM

## 2018-04-06 DIAGNOSIS — G8929 Other chronic pain: Secondary | ICD-10-CM

## 2018-04-06 DIAGNOSIS — M1990 Unspecified osteoarthritis, unspecified site: Secondary | ICD-10-CM

## 2018-04-06 DIAGNOSIS — C9000 Multiple myeloma not having achieved remission: Secondary | ICD-10-CM | POA: Diagnosis not present

## 2018-04-06 DIAGNOSIS — K5900 Constipation, unspecified: Secondary | ICD-10-CM

## 2018-04-06 DIAGNOSIS — I739 Peripheral vascular disease, unspecified: Secondary | ICD-10-CM

## 2018-04-06 DIAGNOSIS — L03211 Cellulitis of face: Secondary | ICD-10-CM

## 2018-04-06 DIAGNOSIS — Z79899 Other long term (current) drug therapy: Secondary | ICD-10-CM

## 2018-04-06 DIAGNOSIS — Z8781 Personal history of (healed) traumatic fracture: Secondary | ICD-10-CM

## 2018-04-06 DIAGNOSIS — T458X5A Adverse effect of other primarily systemic and hematological agents, initial encounter: Secondary | ICD-10-CM

## 2018-04-06 DIAGNOSIS — E669 Obesity, unspecified: Secondary | ICD-10-CM

## 2018-04-06 DIAGNOSIS — Z9181 History of falling: Secondary | ICD-10-CM

## 2018-04-06 DIAGNOSIS — Z7901 Long term (current) use of anticoagulants: Secondary | ICD-10-CM

## 2018-04-06 DIAGNOSIS — N183 Chronic kidney disease, stage 3 (moderate): Secondary | ICD-10-CM

## 2018-04-06 DIAGNOSIS — I129 Hypertensive chronic kidney disease with stage 1 through stage 4 chronic kidney disease, or unspecified chronic kidney disease: Secondary | ICD-10-CM

## 2018-04-06 DIAGNOSIS — Z9221 Personal history of antineoplastic chemotherapy: Secondary | ICD-10-CM

## 2018-04-06 DIAGNOSIS — K449 Diaphragmatic hernia without obstruction or gangrene: Secondary | ICD-10-CM

## 2018-04-06 DIAGNOSIS — Z7982 Long term (current) use of aspirin: Secondary | ICD-10-CM

## 2018-04-06 DIAGNOSIS — M810 Age-related osteoporosis without current pathological fracture: Secondary | ICD-10-CM

## 2018-04-06 DIAGNOSIS — D696 Thrombocytopenia, unspecified: Secondary | ICD-10-CM

## 2018-04-06 DIAGNOSIS — F329 Major depressive disorder, single episode, unspecified: Secondary | ICD-10-CM

## 2018-04-06 DIAGNOSIS — R531 Weakness: Secondary | ICD-10-CM

## 2018-04-06 DIAGNOSIS — R944 Abnormal results of kidney function studies: Secondary | ICD-10-CM

## 2018-04-06 DIAGNOSIS — M87180 Osteonecrosis due to drugs, jaw: Secondary | ICD-10-CM

## 2018-04-06 DIAGNOSIS — M549 Dorsalgia, unspecified: Secondary | ICD-10-CM

## 2018-04-06 LAB — CMP (CANCER CENTER ONLY)
ALBUMIN: 3.1 g/dL — AB (ref 3.5–5.0)
ALT: 18 U/L (ref 0–44)
ANION GAP: 9 (ref 5–15)
AST: 19 U/L (ref 15–41)
Alkaline Phosphatase: 112 U/L (ref 38–126)
BUN: 13 mg/dL (ref 8–23)
CO2: 26 mmol/L (ref 22–32)
Calcium: 9.5 mg/dL (ref 8.9–10.3)
Chloride: 104 mmol/L (ref 98–111)
Creatinine: 1.07 mg/dL — ABNORMAL HIGH (ref 0.44–1.00)
GFR, EST AFRICAN AMERICAN: 53 mL/min — AB (ref >60)
GFR, Estimated: 46 mL/min — ABNORMAL LOW (ref >60)
GLUCOSE: 101 mg/dL — AB (ref 70–99)
POTASSIUM: 4.1 mmol/L (ref 3.5–5.1)
SODIUM: 139 mmol/L (ref 135–145)
Total Bilirubin: 0.7 mg/dL (ref 0.3–1.2)
Total Protein: 7.4 g/dL (ref 6.5–8.1)

## 2018-04-06 LAB — CBC WITH DIFFERENTIAL (CANCER CENTER ONLY)
BASOS PCT: 1 %
Basophils Absolute: 0 10*3/uL (ref 0.0–0.1)
EOS ABS: 0.3 10*3/uL (ref 0.0–0.5)
EOS PCT: 7 %
HCT: 32.2 % — ABNORMAL LOW (ref 34.8–46.6)
Hemoglobin: 10.8 g/dL — ABNORMAL LOW (ref 11.6–15.9)
LYMPHS ABS: 1.5 10*3/uL (ref 0.9–3.3)
Lymphocytes Relative: 35 %
MCH: 33.2 pg (ref 25.1–34.0)
MCHC: 33.4 g/dL (ref 31.5–36.0)
MCV: 99.4 fL (ref 79.5–101.0)
MONO ABS: 0.5 10*3/uL (ref 0.1–0.9)
MONOS PCT: 11 %
NEUTROS PCT: 46 %
Neutro Abs: 2 10*3/uL (ref 1.5–6.5)
PLATELETS: 201 10*3/uL (ref 145–400)
RBC: 3.24 MIL/uL — ABNORMAL LOW (ref 3.70–5.45)
RDW: 14.9 % — AB (ref 11.2–14.5)
WBC Count: 4.4 10*3/uL (ref 3.9–10.3)

## 2018-04-06 MED ORDER — CLINDAMYCIN HCL 300 MG PO CAPS: 300.0000 mg | ORAL_CAPSULE | Freq: Three times a day (TID) | ORAL | 0 refills | Status: DC

## 2018-04-06 MED ORDER — CLINDAMYCIN HCL 300 MG PO CAPS: 300.0000 mg | ORAL_CAPSULE | Freq: Once | ORAL | Status: DC

## 2018-04-06 MED ORDER — LEVOFLOXACIN 500 MG PO TABS: 500.0000 mg | ORAL_TABLET | Freq: Every day | ORAL | 0 refills | Status: DC

## 2018-04-06 MED ORDER — LEVOFLOXACIN IN D5W 500 MG/100ML IV SOLN: 500.0000 mg | INTRAVENOUS | Status: DC
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
## 2018-04-06 NOTE — Telephone Encounter (Signed)
Spoke to PT regarding upcoming aug appts per 8/23 sch message.

## 2018-04-06 NOTE — Progress Notes (Signed)
Pt presents today with oral swelling along lower lip and L side of lower jaw.  Denies SOB/trouble swallowing but states it makes eating/chewing difficult.  Denies fevers/chills.  No rashes present.  Pt has dentures.  Labs drawn from bilat AC veins.  Not capable of achieving IV access for IV abx at this time.  PA aware.

## 2018-04-06 NOTE — Patient Instructions (Signed)
Stomatitis Stomatitis is a condition that causes swelling (inflammation) in your mouth. It can affect a part of your mouth or your whole mouth. The condition often affects your cheek, teeth, gums, lips, and tongue. Stomatitis can also affect the mucous membranes that surround your mouth (mucosa). Pain from stomatitis can make it hard for you to eat or drink. Very bad cases of this condition can lead to not getting enough fluid in your body (dehydration) or poor nutrition. Follow these instructions at home: Medicines  Take medicines only as told by your doctor.  If you were prescribed an antibiotic, finish all of it even if you start to feel better. Lifestyle  Take good care of your mouth and teeth (oral hygiene): ? Gently brush your teeth with a soft, nylon-bristled toothbrush two times each day. ? Floss your teeth every day. ? Have your teeth cleaned regularly. Do this as told by your dentist.  Eat a balanced diet. Do not eat: ? Spicy foods. ? Citrus, such as oranges. ? Foods that have sharp edges, such as chips.  Avoid any foods or other things that you think may be causing this condition.  If you have dentures, make sure that they fit the way that they should.  Do not use any tobacco products, including cigarettes, chewing tobacco, or electronic cigarettes. If you need help quitting, ask your doctor.  Find ways to lower your stress. Try yoga or meditation. Ask your doctor for other ideas. General instructions  Use a salt-water rinse for pain as told by your doctor. Mix 1 tsp of salt in 2 cups of water.  Drink enough fluid to keep your pee (urine) clear or pale yellow. This will keep you hydrated. Contact a doctor if:  Your symptoms get worse.  You develop new symptoms, especially: ? A rash. ? New symptoms that do not involve your mouth area.  Your symptoms last longer than three weeks.  Your stomatitis goes away and then comes back.  You have a harder time eating and  drinking normally.  You are more tired.  You feel weaker.  You stop feeling hungry.  You feel sick to your stomach (nauseous).  You have a fever. This information is not intended to replace advice given to you by your health care provider. Make sure you discuss any questions you have with your health care provider. Document Released: 07/21/2011 Document Revised: 03/30/2016 Document Reviewed: 07/28/2014 Elsevier Interactive Patient Education  [employer].

## 2018-04-07 ENCOUNTER — Encounter: Payer: Self-pay | Admitting: Medical

## 2018-04-09 ENCOUNTER — Telehealth: Payer: Self-pay | Admitting: Medical

## 2018-04-09 ENCOUNTER — Encounter: Payer: Medicare Other | Admitting: Physical Therapy

## 2018-04-09 NOTE — Progress Notes (Signed)
These preliminary result these preliminary results were noted.  Awaiting final report.

## 2018-04-09 NOTE — Telephone Encounter (Signed)
Continues on Levaquin and clindamycin.  Facial erythema and swelling is improving.  She does not see her dentist until Thursday.  I told her to contact our office should her condition worsen prior to seeing her dentist.  Sandi Mealy, MHS, PA-C Physician Assistant

## 2018-04-10 NOTE — Progress Notes (Signed)
These preliminary result these preliminary results were noted.  Awaiting final report.

## 2018-04-11 ENCOUNTER — Encounter: Payer: Medicare Other | Admitting: Physical Therapy

## 2018-04-11 LAB — CULTURE, BLOOD (SINGLE)
CULTURE: NO GROWTH
CULTURE: NO GROWTH
Special Requests: ADEQUATE
Special Requests: ADEQUATE

## 2018-04-11 NOTE — Progress Notes (Signed)
These preliminary result these preliminary results were noted.  Awaiting final report.

## 2018-04-18 ENCOUNTER — Telehealth: Payer: Self-pay

## 2018-04-18 NOTE — Telephone Encounter (Signed)
Faxed script for Revlimid along with [employer] authorization to [employer].

## 2018-04-19 ENCOUNTER — Ambulatory Visit: Payer: Medicare Other | Attending: Family Medicine | Admitting: Physical Therapy

## 2018-04-19 ENCOUNTER — Encounter: Payer: Self-pay | Admitting: Physical Therapy

## 2018-04-19 DIAGNOSIS — M6281 Muscle weakness (generalized): Secondary | ICD-10-CM | POA: Diagnosis present

## 2018-04-19 DIAGNOSIS — M25551 Pain in right hip: Secondary | ICD-10-CM | POA: Diagnosis present

## 2018-04-19 DIAGNOSIS — M25511 Pain in right shoulder: Secondary | ICD-10-CM | POA: Diagnosis present

## 2018-04-19 DIAGNOSIS — M25611 Stiffness of right shoulder, not elsewhere classified: Secondary | ICD-10-CM

## 2018-04-19 NOTE — Therapy (Signed)
[facility] [address] [place], [place], [address] Phone: [phone]   Fax:  [phone]  Physical Therapy Treatment  Patient Details  Name: Kristin [name] MRN: [id number] Date of Birth: [birth year] Referring Provider: Yaakov Guthrie, MD    Encounter Date: 04/19/2018  PT End of Session - 04/19/18 1238    Visit Number  6    Date for PT Re-Evaluation  05/19/18    Authorization Type  Med pay    Authorization Time Period  03/19/18 to 05/19/18    Authorization - Visit Number  6    Authorization - Number of Visits  10    PT Start Time  3662    PT Stop Time  1308    PT Time Calculation (min)  38 min    Activity Tolerance  Patient tolerated treatment well;No increased pain    Behavior During Therapy  WFL for tasks assessed/performed       Past Medical History:  Diagnosis Date  . Bone cancer [facility])    "being tx'd by Dr. Burr Medico @ [facility]" (10/09/2017)  . Chronic kidney disease (CKD), stage III (moderate) (HCC)   . Depression    Risperdal "twice/day" (09/23/2016)  . Glaucoma   . Hiatal hernia   . Hypertension   . Intractable back pain    /notes 09/23/2016  . Obesity   . Osteoarthritis    "all over" (10/09/2017)  . Osteoporosis    vertebral fracture in 2012  . Peripheral vascular disease ([place])   . Upper GI bleed 04/2004   secondary to Mallory-Weiss tear/notes 12/28/2010  . Vertigo     Past Surgical History:  Procedure Laterality Date  . ABDOMINAL HYSTERECTOMY  1974   partial  . APPENDECTOMY     as a child/notes 12/28/2010  . BACK SURGERY    . BREAST BIOPSY Left 1999   Archie Endo 12/28/2010  . CATARACT EXTRACTION W/ INTRAOCULAR LENS  IMPLANT, BILATERAL Bilateral 2000s  . ESOPHAGOGASTRODUODENOSCOPY  04/2004   Archie Endo 12/28/2010  . FIXATION KYPHOPLASTY THORACIC SPINE  05/2011   T10/notes 05/25/2011  . FRACTURE SURGERY    . HIP ARTHROPLASTY Right 01/19/2017   Procedure: RIGHT HIP HEMIARTHROPLASTY;  Surgeon: Renette Butters, MD;  Location: [place];  Service: Orthopedics;  Laterality: Right;  . LAPAROSCOPIC CHOLECYSTECTOMY  10/2007   Archie Endo 12/14/2010  . LOWER EXTREMITY ANGIOGRAPHY N/A 10/09/2017   Procedure: LOWER EXTREMITY ANGIOGRAPHY;  Surgeon: Lorretta Harp, MD;  Location: [place] [facility];  Service: Cardiovascular;  Laterality: N/A;  bilateral  . LOWER EXTREMITY ANGIOGRAPHY  11/21/2017   Procedure: Lower Extremity Angiography;  Surgeon: Adrian Prows, MD;  Location: [place] [facility];  Service: Cardiovascular;;  . PERIPHERAL VASCULAR BALLOON ANGIOPLASTY  10/09/2017   Attempted PTA Archie Endo 10/09/2017  . PERIPHERAL VASCULAR BALLOON ANGIOPLASTY  10/09/2017   Procedure: PERIPHERAL VASCULAR BALLOON ANGIOPLASTY;  Surgeon: Lorretta Harp, MD;  Location: [facility];  Service: Cardiovascular;;  Attempted PTA     There were no vitals filed for this visit.  Subjective Assessment - 04/19/18 1235    Subjective  Pt reports that she is doing better. She feels that her shoulder is about 75% better and her side will ache her a litte more frequently.     Currently in Pain?  No/denies    Pain Onset  More than a month ago  [place] Adult PT Treatment/Exercise - 04/19/18 0001      Exercises   Exercises  Lumbar      Lumbar Exercises: Stretches   Lower Trunk Rotation  3 reps;20 seconds    Lower Trunk Rotation Limitations  pt had difficulty understanding PT instructions      Lumbar Exercises: Standing   Heel Raises  15 reps      Lumbar Exercises: Seated   Long Arc Quad on Chair  Both;2 sets;10 reps    LAQ on Chair Weights (lbs)  2    Other Seated Lumbar Exercises  seated alternating march x15 reps each     Other Seated Lumbar Exercises  clamshell with yellow TB 2x10 reps; adduction ball squeeze x15 reps       Shoulder Exercises: Seated   Other Seated Exercises  B horizontal abduction 2x10 reps     Other Seated Exercises  B rows with yellow TB 2x10 reps        Shoulder Exercises: Pulleys   Flexion  2 minutes    Scaption  2 minutes             PT Education - 04/19/18 1251    Education Details  technique with therex    Person(s) Educated  Patient    Methods  Explanation;Verbal cues;Tactile cues    Comprehension  Verbalized understanding;Returned demonstration       PT Short Term Goals - 03/29/18 1442      PT SHORT TERM GOAL #1   Title  Pt will demo consistency and independence with HEP to improve ROM and decrease pain.    Time  4    Period  Weeks    Status  On-going      PT SHORT TERM GOAL #2   Title  Pt will demo improved Rt shoulder flexion active ROM to atleast 120 deg which will assist with her ability to reach over head into her cabinet.    Time  4    Period  Weeks    Status  New      PT SHORT TERM GOAL #3   Title  Pt will report atleast 30% improvement in her pain from the start of PT.    Time  4    Period  Weeks    Status  New        PT Long Term Goals - 04/02/18 1133      PT LONG TERM GOAL #1   Title  Pt will complete 5x sit to stand in less than 14 sec without UE assistance, to reflect increase in LE strength and power.    Time  8    Period  Weeks    Status  Achieved   13 sec           Plan - 04/19/18 1303    Clinical Impression Statement  Pt reports atleast 75% improvement in her shoulder since beginning PT. She also notes improved Rt side pain however this will occasionally bother her. During today's session, pt reported no pain and was unable to describe what positions/movements aggravate her side. Session focused on gentle therex to improve LE/UE strength and endurance. Pt was able to complete 6 min on Nustep with therapist encouraging maintained SPM above 30. End of session no increase in pain was reported.     Rehab Potential  Good    PT Frequency  2x / week    PT Duration  8 weeks    PT Treatment/Interventions  ADLs/Self  Care Home Management;Moist Heat;Electrical  Stimulation;Cryotherapy;Therapeutic exercise;Therapeutic activities;Functional mobility training;Balance training;Neuromuscular re-education;Patient/family education;Manual techniques;Passive range of motion;Dry needling;Taping    PT Next Visit Plan  Nustep for LE/hip strength with the arms, shoulder isometrics, gentle trunk ROM    PT Home Exercise Plan  [id number]    Consulted and Agree with Plan of Care  Patient       Patient will benefit from skilled therapeutic intervention in order to improve the following deficits and impairments:  Abnormal gait, Decreased activity tolerance, Decreased strength, Impaired flexibility, Impaired UE functional use, Postural dysfunction, Pain, Improper body mechanics, Decreased range of motion, Difficulty walking, Decreased endurance, Increased muscle spasms, Hypomobility  Visit Diagnosis: Right shoulder pain, unspecified chronicity  Pain in right hip  Muscle weakness (generalized)  Stiffness of right shoulder, not elsewhere classified     Problem List Patient Active Problem List   Diagnosis Date Noted  . Critical lower limb ischemia 10/04/2017  . Advance care planning   . Goals of care, counseling/discussion   . Palliative care by specialist   . CKD (chronic kidney disease), stage III ([place]) 01/18/2017  . Hip fracture, pathological ([place]) 01/18/2017  . Pathologic hip fracture ([place]) 01/18/2017  . DNR (do not resuscitate) discussion 11/01/2016  . Multiple myeloma ([place]) 10/31/2016  . Acute encephalopathy 10/18/2016  . Hypercalcemia 10/18/2016  . Fall   . Anemia in neoplastic disease 09/23/2016  . Sepsis ([place]) 09/23/2016  . Back pain 09/23/2016  . Intractable back pain 06/20/2016  . Other malaise and fatigue 11/26/2013  . Vertigo   . Obesity   . Arthritis of knee, left 07/09/2013  . Need for prophylactic vaccination and inoculation against influenza 07/09/2013  . Osteoporosis with pathological fracture 01/01/2013  . HLD (hyperlipidemia)  01/01/2013  . HTN (hypertension) 01/01/2013  . Chronic back pain 01/01/2013  . Vertebral compression fracture ([place]) 01/01/2013  . Seasonal allergic rhinitis 01/01/2013    1:15 PM,04/19/18 Sherol Dade PT, DPT [place] [facility] [place]  [facility] [address] [place], [place], [address] Phone: [phone]   Fax:  [phone]  Name: WILLETTE MUDRY MRN: [id number] Date of Birth: Apr 29, 1931

## 2018-04-23 ENCOUNTER — Ambulatory Visit: Payer: Medicare Other | Admitting: Physical Therapy

## 2018-04-23 ENCOUNTER — Encounter: Payer: Self-pay | Admitting: Physical Therapy

## 2018-04-23 DIAGNOSIS — M25511 Pain in right shoulder: Secondary | ICD-10-CM

## 2018-04-23 DIAGNOSIS — M25611 Stiffness of right shoulder, not elsewhere classified: Secondary | ICD-10-CM

## 2018-04-23 DIAGNOSIS — M25551 Pain in right hip: Secondary | ICD-10-CM

## 2018-04-23 DIAGNOSIS — M6281 Muscle weakness (generalized): Secondary | ICD-10-CM

## 2018-04-23 NOTE — Therapy (Signed)
[facility] [address] [place], [place], [address] Phone: [phone]   Fax:  [phone]  Physical Therapy Treatment  Patient Details  Name: Kristin [name] MRN: [id number] Date of Birth: 01/04/1931 Referring Provider: Yaakov Guthrie, MD    Encounter Date: 04/23/2018  PT End of Session - 04/23/18 1402    Visit Number  7    Date for PT Re-Evaluation  05/19/18    Authorization Type  Med pay    Authorization Time Period  03/19/18 to 05/19/18    Authorization - Visit Number  7    Authorization - Number of Visits  10    PT Start Time  1400    PT Stop Time  1694    PT Time Calculation (min)  38 min    Activity Tolerance  Patient tolerated treatment well;No increased pain    Behavior During Therapy  WFL for tasks assessed/performed       Past Medical History:  Diagnosis Date  . Bone cancer [facility])    "being tx'd by Dr. Burr Medico @ [facility]" (10/09/2017)  . Chronic kidney disease (CKD), stage III (moderate) (HCC)   . Depression    Risperdal "twice/day" (09/23/2016)  . Glaucoma   . Hiatal hernia   . Hypertension   . Intractable back pain    /notes 09/23/2016  . Obesity   . Osteoarthritis    "all over" (10/09/2017)  . Osteoporosis    vertebral fracture in 2012  . Peripheral vascular disease ([place])   . Upper GI bleed 04/2004   secondary to Mallory-Weiss tear/notes 12/28/2010  . Vertigo     Past Surgical History:  Procedure Laterality Date  . ABDOMINAL HYSTERECTOMY  1974   partial  . APPENDECTOMY     as a child/notes 12/28/2010  . BACK SURGERY    . BREAST BIOPSY Left 1999   Archie Endo 12/28/2010  . CATARACT EXTRACTION W/ INTRAOCULAR LENS  IMPLANT, BILATERAL Bilateral 2000s  . ESOPHAGOGASTRODUODENOSCOPY  04/2004   Archie Endo 12/28/2010  . FIXATION KYPHOPLASTY THORACIC SPINE  05/2011   T10/notes 05/25/2011  . FRACTURE SURGERY    . HIP ARTHROPLASTY Right 01/19/2017   Procedure: RIGHT HIP HEMIARTHROPLASTY;  Surgeon: Renette Butters, MD;  Location: [place];  Service: Orthopedics;  Laterality: Right;  . LAPAROSCOPIC CHOLECYSTECTOMY  10/2007   Archie Endo 12/14/2010  . LOWER EXTREMITY ANGIOGRAPHY N/A 10/09/2017   Procedure: LOWER EXTREMITY ANGIOGRAPHY;  Surgeon: Lorretta Harp, MD;  Location: [place] [facility];  Service: Cardiovascular;  Laterality: N/A;  bilateral  . LOWER EXTREMITY ANGIOGRAPHY  11/21/2017   Procedure: Lower Extremity Angiography;  Surgeon: Adrian Prows, MD;  Location: [place] [facility];  Service: Cardiovascular;;  . PERIPHERAL VASCULAR BALLOON ANGIOPLASTY  10/09/2017   Attempted PTA Archie Endo 10/09/2017  . PERIPHERAL VASCULAR BALLOON ANGIOPLASTY  10/09/2017   Procedure: PERIPHERAL VASCULAR BALLOON ANGIOPLASTY;  Surgeon: Lorretta Harp, MD;  Location: [place] [facility];  Service: Cardiovascular;;  Attempted PTA     There were no vitals filed for this visit.  Subjective Assessment - 04/23/18 1403    Subjective  No pain today, had some yesterday.    Pertinent History  back surgery, Rt THA (posterior), multiple myeloma    Limitations  Lifting    Patient Stated Goals  improve strength in her Rt arm and Rt hip    Currently in Pain?  No/denies    Multiple Pain Sites  No  [place] Adult PT Treatment/Exercise - 04/23/18 0001      Lumbar Exercises: Stretches   Lower Trunk Rotation  5 reps;20 seconds    Lower Trunk Rotation Limitations  Tactile instructions initially to get pt started      Lumbar Exercises: Aerobic   Nustep  L2 x 10 min   PTA present to discuss status/progress     Lumbar Exercises: Standing   Heel Raises  15 reps      Lumbar Exercises: Seated   Long Arc Quad on Chair  Both;2 sets;10 reps   VC to slow speed   LAQ on Chair Weights (lbs)  2    Other Seated Lumbar Exercises  seated alternating march x15 reps each    added 2#, VC to slow speed   Other Seated Lumbar Exercises  clamshell with red TB 2x10 reps; adduction ball squeeze x20 reps    Only  instructional cues     Shoulder Exercises: Seated   Other Seated Exercises  B horizontal abduction 2x10 reps     Other Seated Exercises  B rows with yellow TB 2x10 reps       Shoulder Exercises: Pulleys   Flexion  3 minutes    Scaption  3 minutes               PT Short Term Goals - 03/29/18 1442      PT SHORT TERM GOAL #1   Title  Pt will demo consistency and independence with HEP to improve ROM and decrease pain.    Time  4    Period  Weeks    Status  On-going      PT SHORT TERM GOAL #2   Title  Pt will demo improved Rt shoulder flexion active ROM to atleast 120 deg which will assist with her ability to reach over head into her cabinet.    Time  4    Period  Weeks    Status  New      PT SHORT TERM GOAL #3   Title  Pt will report atleast 30% improvement in her pain from the start of PT.    Time  4    Period  Weeks    Status  New        PT Long Term Goals - 04/02/18 1133      PT LONG TERM GOAL #1   Title  Pt will complete 5x sit to stand in less than 14 sec without UE assistance, to reflect increase in LE strength and power.    Time  8    Period  Weeks    Status  Achieved   13 sec           Plan - 04/23/18 1403    Clinical Impression Statement  Pt presents today painfree. We were able to make incremental changes in more load or reps for her LE/UE strength. No pain during session with exercises. She verbally reports she feels better in her feet when walking and her Rt "side" doesn't hurt as much.    Rehab Potential  Good    PT Frequency  2x / week    PT Duration  8 weeks    PT Treatment/Interventions  ADLs/Self Care Home Management;Moist Heat;Electrical Stimulation;Cryotherapy;Therapeutic exercise;Therapeutic activities;Functional mobility training;Balance training;Neuromuscular re-education;Patient/family education;Manual techniques;Passive range of motion;Dry needling;Taping    PT Next Visit Plan  Nustep for LE/hip strength with the arms, shoulder  isometrics, gentle trunk ROM    PT Home Exercise Plan  [id number]    Consulted and Agree with Plan of Care  Patient       Patient will benefit from skilled therapeutic intervention in order to improve the following deficits and impairments:  Abnormal gait, Decreased activity tolerance, Decreased strength, Impaired flexibility, Impaired UE functional use, Postural dysfunction, Pain, Improper body mechanics, Decreased range of motion, Difficulty walking, Decreased endurance, Increased muscle spasms, Hypomobility  Visit Diagnosis: Right shoulder pain, unspecified chronicity  Pain in right hip  Muscle weakness (generalized)  Stiffness of right shoulder, not elsewhere classified     Problem List Patient Active Problem List   Diagnosis Date Noted  . Critical lower limb ischemia 10/04/2017  . Advance care planning   . Goals of care, counseling/discussion   . Palliative care by specialist   . CKD (chronic kidney disease), stage III ([place]) 01/18/2017  . Hip fracture, pathological ([place]) 01/18/2017  . Pathologic hip fracture ([place]) 01/18/2017  . DNR (do not resuscitate) discussion 11/01/2016  . Multiple myeloma ([place]) 10/31/2016  . Acute encephalopathy 10/18/2016  . Hypercalcemia 10/18/2016  . Fall   . Anemia in neoplastic disease 09/23/2016  . Sepsis ([place]) 09/23/2016  . Back pain 09/23/2016  . Intractable back pain 06/20/2016  . Other malaise and fatigue 11/26/2013  . Vertigo   . Obesity   . Arthritis of knee, left 07/09/2013  . Need for prophylactic vaccination and inoculation against influenza 07/09/2013  . Osteoporosis with pathological fracture 01/01/2013  . HLD (hyperlipidemia) 01/01/2013  . HTN (hypertension) 01/01/2013  . Chronic back pain 01/01/2013  . Vertebral compression fracture ([place]) 01/01/2013  . Seasonal allergic rhinitis 01/01/2013    [place], PTA 04/23/2018, 2:31 PM  [facility] [address],  [place], [place], [address] Phone: [phone]   Fax:  [phone]  Name: LAYAAN MOTT MRN: [id number] Date of Birth: 11/22/1930

## 2018-04-24 ENCOUNTER — Inpatient Hospital Stay: Payer: Medicare Other

## 2018-04-24 ENCOUNTER — Telehealth: Payer: Self-pay | Admitting: Nurse Practitioner

## 2018-04-24 ENCOUNTER — Inpatient Hospital Stay (HOSPITAL_BASED_OUTPATIENT_CLINIC_OR_DEPARTMENT_OTHER): Payer: Medicare Other | Admitting: Nurse Practitioner

## 2018-04-24 ENCOUNTER — Inpatient Hospital Stay: Payer: Medicare Other | Attending: Hematology

## 2018-04-24 ENCOUNTER — Encounter: Payer: Self-pay | Admitting: Nurse Practitioner

## 2018-04-24 VITALS — BP 160/71 | HR 65 | Temp 98.2°F | Resp 20 | Ht 59.0 in | Wt 143.0 lb

## 2018-04-24 DIAGNOSIS — M7989 Other specified soft tissue disorders: Secondary | ICD-10-CM | POA: Diagnosis not present

## 2018-04-24 DIAGNOSIS — C9000 Multiple myeloma not having achieved remission: Secondary | ICD-10-CM | POA: Insufficient documentation

## 2018-04-24 DIAGNOSIS — Z8781 Personal history of (healed) traumatic fracture: Secondary | ICD-10-CM | POA: Insufficient documentation

## 2018-04-24 DIAGNOSIS — Z7901 Long term (current) use of anticoagulants: Secondary | ICD-10-CM | POA: Diagnosis not present

## 2018-04-24 DIAGNOSIS — N183 Chronic kidney disease, stage 3 (moderate): Secondary | ICD-10-CM | POA: Insufficient documentation

## 2018-04-24 DIAGNOSIS — T458X5A Adverse effect of other primarily systemic and hematological agents, initial encounter: Secondary | ICD-10-CM

## 2018-04-24 DIAGNOSIS — Z8719 Personal history of other diseases of the digestive system: Secondary | ICD-10-CM

## 2018-04-24 DIAGNOSIS — M549 Dorsalgia, unspecified: Secondary | ICD-10-CM

## 2018-04-24 DIAGNOSIS — E669 Obesity, unspecified: Secondary | ICD-10-CM

## 2018-04-24 DIAGNOSIS — D630 Anemia in neoplastic disease: Secondary | ICD-10-CM | POA: Diagnosis not present

## 2018-04-24 DIAGNOSIS — Z79899 Other long term (current) drug therapy: Secondary | ICD-10-CM

## 2018-04-24 DIAGNOSIS — Z7982 Long term (current) use of aspirin: Secondary | ICD-10-CM | POA: Diagnosis not present

## 2018-04-24 DIAGNOSIS — K449 Diaphragmatic hernia without obstruction or gangrene: Secondary | ICD-10-CM | POA: Diagnosis not present

## 2018-04-24 DIAGNOSIS — F329 Major depressive disorder, single episode, unspecified: Secondary | ICD-10-CM | POA: Insufficient documentation

## 2018-04-24 DIAGNOSIS — I129 Hypertensive chronic kidney disease with stage 1 through stage 4 chronic kidney disease, or unspecified chronic kidney disease: Secondary | ICD-10-CM

## 2018-04-24 DIAGNOSIS — I739 Peripheral vascular disease, unspecified: Secondary | ICD-10-CM

## 2018-04-24 DIAGNOSIS — M87180 Osteonecrosis due to drugs, jaw: Secondary | ICD-10-CM

## 2018-04-24 DIAGNOSIS — E630 Essential fatty acid [EFA] deficiency: Secondary | ICD-10-CM

## 2018-04-24 DIAGNOSIS — M810 Age-related osteoporosis without current pathological fracture: Secondary | ICD-10-CM

## 2018-04-24 DIAGNOSIS — Z9221 Personal history of antineoplastic chemotherapy: Secondary | ICD-10-CM | POA: Insufficient documentation

## 2018-04-24 LAB — CBC WITH DIFFERENTIAL/PLATELET
BASOS PCT: 1 %
Basophils Absolute: 0 10*3/uL (ref 0.0–0.1)
EOS ABS: 0.1 10*3/uL (ref 0.0–0.5)
EOS PCT: 3 %
HCT: 31 % — ABNORMAL LOW (ref 34.8–46.6)
HEMOGLOBIN: 10.3 g/dL — AB (ref 11.6–15.9)
Lymphocytes Relative: 36 %
Lymphs Abs: 1.3 10*3/uL (ref 0.9–3.3)
MCH: 33 pg (ref 25.1–34.0)
MCHC: 33.3 g/dL (ref 31.5–36.0)
MCV: 98.9 fL (ref 79.5–101.0)
Monocytes Absolute: 0.3 10*3/uL (ref 0.1–0.9)
Monocytes Relative: 8 %
NEUTROS PCT: 52 %
Neutro Abs: 1.9 10*3/uL (ref 1.5–6.5)
PLATELETS: 231 10*3/uL (ref 145–400)
RBC: 3.13 MIL/uL — ABNORMAL LOW (ref 3.70–5.45)
RDW: 15.5 % — ABNORMAL HIGH (ref 11.2–14.5)
WBC: 3.6 10*3/uL — ABNORMAL LOW (ref 3.9–10.3)

## 2018-04-24 LAB — COMPREHENSIVE METABOLIC PANEL
ALK PHOS: 67 U/L (ref 38–126)
ALT: 7 U/L (ref 0–44)
ANION GAP: 9 (ref 5–15)
AST: 15 U/L (ref 15–41)
Albumin: 3.1 g/dL — ABNORMAL LOW (ref 3.5–5.0)
BILIRUBIN TOTAL: 0.5 mg/dL (ref 0.3–1.2)
BUN: 20 mg/dL (ref 8–23)
CALCIUM: 9.1 mg/dL (ref 8.9–10.3)
CO2: 26 mmol/L (ref 22–32)
CREATININE: 1.41 mg/dL — AB (ref 0.44–1.00)
Chloride: 105 mmol/L (ref 98–111)
GFR calc Af Amer: 38 mL/min — ABNORMAL LOW (ref >60)
GFR calc non Af Amer: 33 mL/min — ABNORMAL LOW (ref >60)
Glucose, Bld: 123 mg/dL — ABNORMAL HIGH (ref 70–99)
Potassium: 4.1 mmol/L (ref 3.5–5.1)
SODIUM: 140 mmol/L (ref 135–145)
TOTAL PROTEIN: 6.7 g/dL (ref 6.5–8.1)

## 2018-04-24 MED ORDER — DENOSUMAB 120 MG/1.7ML ~~LOC~~ SOLN: 120.0000 mg | Freq: Once | SUBCUTANEOUS | Status: DC

## 2018-04-24 MED ORDER — OXYCODONE HCL 10 MG PO TABS: 5.0000 mg | ORAL_TABLET | Freq: Four times a day (QID) | ORAL | 0 refills | Status: DC | PRN

## 2018-04-24 MED ORDER — MORPHINE SULFATE ER 30 MG PO TBCR: 30.0000 mg | EXTENDED_RELEASE_TABLET | Freq: Two times a day (BID) | ORAL | 0 refills | Status: DC

## 2018-04-24 NOTE — Progress Notes (Signed)
[place]  [phone] [phone]  Clinic Follow up Note   Patient Care Team: [name], MD as PCP - General (Family Medicine) 04/24/2018  SUMMARY OF ONCOLOGIC HISTORY:   Multiple myeloma ([place])   10/18/2016 - 10/26/2016 Hospital Admission    Pt was admitted for symptomatic hypercalcemia, AKI, bone marrow biopsy done during hospital stay, and she received Aredia     10/19/2016 Tumor Marker    SPEP showed M-protein 0.4g/dl, serum lambda light chain 5362, ratio 0.00    10/25/2016 Bone Marrow Biopsy     Overall, the marrow is hypercellular with increased lambda-restricted plasma cells (62% aspirate, 90% CD138). There is partial expression of CD20, but no monoclonal B-cell population is detected by flow cytometry.     10/25/2016 Initial Diagnosis    Multiple myeloma ([place])    10/25/2016 Miscellaneous    Cytogenetics and FISH showed loss of 13q, which is intermediate risk for MM     11/10/2016 -  Chemotherapy    Revlimid 10 mg (increased to 68m on cycle 2) daily, 3 weeks on, and one week off, dexamethasone 20 mg weekly, was off from early June to mid July 2018 due to her right hip fracture and hospitalization.  Changed to maintenence Revlimid 130mand stopped dexamethasone starting next cycle, 06/10/17       01/02/2017 Imaging     DG Hip Unilat w or w/o Pelvis IMPRESSION 1. Degenerative changes lumbar spine and both hips. No evidence of fracture or dislocation.  2. Diffuse osteopenia. Multiple tiny lucencies are noted throughout both proximal femurs. These may be secondary to the patient's known multiple myeloma.    01/18/2017 Imaging     CT hip right wo contrast  IMPRESSION: Findings consistent with a pathologic fracture of the right femoral neck. A large lytic lesion in the superior aspect of the lateral femoral head is identified.  Possible nondisplaced fracture of the right inferior pubic ramus.  Diffuse myeloma.    01/18/2017  - 01/23/2017 Hospital Admission    She present to the ED with complaints of severe right hip pain. she recieved a right hip hemiarthroplasty with Dr. MuPercell Miller  CURRENT THERAPY:  1. RD (Revlimid 10 mg daily, increased to 1538mrom cycle 3, 3 weeks on, one week off, and weekly dexamethasone 68m55mstarted on 11/10/2016. 2.Change to maintenance Revlimid 10mg86mly for 3 weeks on and 1 week off, and stopped dexamethasone started on 06/10/17. Reduced to 5mg o15m/20/19 due to elevated Cr and thrombocytopenia  3. Aredia 60mg e14m 4 weeks, started on 10/24/2016 for hypercalcemia. Switch to Xgeva injections every 4 weeks on 01/02/17  INTERVAL HISTORY: Ms. Fauver reHoldermans for follow up as scheduled. [facility]. [facility] was treated with 1 week course of cleocin and levaquin which she completed for abscess vs osteonecrosis. Xgeva and revlimid were held since then. She saw her dentist Dr. Miller Sabra Heckeek who prescribed antibiotic mouth wash. She has f/u there next week. Pain and swelling have nearly resolved. No recent fever or chills. She eats soft diet and liquids. appetite is stable without mirtazapine. Denies significant n/v/c/d. Leg edema is stable, on lasix daily. Her pain in general is stable. She occasionally feels too drowsy after oxycodone but feels the dose is adequate for pain control.  Denies cough, chest pain, dyspnea, or neuropathy.     MEDICAL HISTORY:  Past Medical History:  Diagnosis Date  . Bone cancer (  [place])    "being tx'd by Dr. Burr Medico @ [facility]" (10/09/2017)  . Chronic kidney disease (CKD), stage III (moderate) ([facility])   . Depression    Risperdal "twice/day" (09/23/2016)  . Glaucoma   . Hiatal hernia   . Hypertension   . Intractable back pain    /notes 09/23/2016  . Obesity   . Osteoarthritis    "all over" (10/09/2017)  . Osteoporosis    vertebral fracture in 2012  . Peripheral vascular disease ([place])   . Upper GI bleed 04/2004   secondary to  Mallory-Weiss tear/notes 12/28/2010  . Vertigo     SURGICAL HISTORY: Past Surgical History:  Procedure Laterality Date  . ABDOMINAL HYSTERECTOMY  1974   partial  . APPENDECTOMY     as a child/notes 12/28/2010  . BACK SURGERY    . BREAST BIOPSY Left 1999   Archie Endo 12/28/2010  . CATARACT EXTRACTION W/ INTRAOCULAR LENS  IMPLANT, BILATERAL Bilateral 2000s  . ESOPHAGOGASTRODUODENOSCOPY  04/2004   Archie Endo 12/28/2010  . FIXATION KYPHOPLASTY THORACIC SPINE  05/2011   T10/notes 05/25/2011  . FRACTURE SURGERY    . HIP ARTHROPLASTY Right 01/19/2017   Procedure: RIGHT HIP HEMIARTHROPLASTY;  Surgeon: Renette Butters, MD;  Location: [facility];  Service: Orthopedics;  Laterality: Right;  . LAPAROSCOPIC CHOLECYSTECTOMY  10/2007   Archie Endo 12/14/2010  . LOWER EXTREMITY ANGIOGRAPHY N/A 10/09/2017   Procedure: LOWER EXTREMITY ANGIOGRAPHY;  Surgeon: Lorretta Harp, MD;  Location: [place] [facility];  Service: Cardiovascular;  Laterality: N/A;  bilateral  . LOWER EXTREMITY ANGIOGRAPHY  11/21/2017   Procedure: Lower Extremity Angiography;  Surgeon: Adrian Prows, MD;  Location: [place] [facility];  Service: Cardiovascular;;  . PERIPHERAL VASCULAR BALLOON ANGIOPLASTY  10/09/2017   Attempted PTA Archie Endo 10/09/2017  . PERIPHERAL VASCULAR BALLOON ANGIOPLASTY  10/09/2017   Procedure: PERIPHERAL VASCULAR BALLOON ANGIOPLASTY;  Surgeon: Lorretta Harp, MD;  Location: [place] [facility];  Service: Cardiovascular;;  Attempted PTA     I have reviewed the social history and family history with the patient and they are unchanged from previous note.  ALLERGIES:  is allergic to penicillins; tramadol; alendronate; and sulfonamide derivatives.  MEDICATIONS:  Current Outpatient Medications  Medication Sig Dispense Refill  . amlodipine-atorvastatin (CADUET) 2.5-10 MG tablet Take 1 tablet by mouth daily.    Marland Kitchen atorvastatin (LIPITOR) 10 MG tablet Take 10 mg by mouth daily.    . Calcium Carbonate (CALCIUM 600 PO) Take 600 mg by mouth  daily.     . clopidogrel (PLAVIX) 75 MG tablet Take 75 mg by mouth daily.    . feeding supplement, ENSURE ENLIVE, (ENSURE ENLIVE) LIQD Take 237 mLs by mouth 2 (two) times daily between meals. (Patient taking differently: Take 237 mLs by mouth daily. ) 237 mL 12  . furosemide (LASIX) 20 MG tablet Take 20 mg by mouth daily.    . irbesartan (AVAPRO) 75 MG tablet Take 75 mg by mouth daily after supper.    Marland Kitchen lenalidomide (REVLIMID) 5 MG capsule Take 1 capsule (5 mg total) by mouth daily. ADULT Female:  Authorization #[id number]  03/20/2018 21 capsule 0  . magnesium oxide (MAG-OX) 400 (241.3 Mg) MG tablet Take 1 tablet (400 mg total) by mouth daily. 7 tablet 0  . megestrol (MEGACE) 40 MG tablet Take 1 tablet (40 mg total) by mouth daily.    Marland Kitchen morphine ([name]) 30 MG 12 hr tablet Take 1 tablet (30 mg total) by mouth every 12 (twelve) hours. 60 tablet 0  .  ondansetron (ZOFRAN ODT) 4 MG disintegrating tablet Take 1-2 tablets (4-8 mg total) by mouth every 8 (eight) hours as needed for nausea or vomiting. (Patient taking differently: Take 4 mg by mouth daily. ) 30 tablet 2  . Oxycodone HCl 10 MG TABS Take 0.5-1 tablets (5-10 mg total) by mouth every 6 (six) hours as needed. FOR MODERATE OR SEVERE PAIN 60 tablet 0  . RESTASIS 0.05 % ophthalmic emulsion Place 1 drop into both eyes daily.     . risperiDONE (RISPERDAL) 0.5 MG tablet Take 0.5 mg by mouth daily.     Marland Kitchen SANTYL ointment Apply 1 application topically every other day.  0  . sennosides-docusate sodium (SENOKOT-S) 8.6-50 MG tablet Take 1 tablet by mouth 2 (two) times daily.     Marland Kitchen triamcinolone (KENALOG) 0.025 % cream APPLY TO AFFECTED AREA TWICE A DAY FOR 10 DAYS  1  . XARELTO 2.5 MG TABS tablet Take 2.5 mg by mouth 2 (two) times daily with a meal.  2  . aspirin 81 MG chewable tablet Chew 81 mg by mouth daily.    . clindamycin (CLEOCIN) 300 MG capsule Take 1 capsule (300 mg total) by mouth 3 (three) times daily. 21 capsule 0  . levofloxacin (LEVAQUIN)  500 MG tablet Take 1 tablet (500 mg total) by mouth daily. 7 tablet 0  . mirtazapine (REMERON) 15 MG tablet Take 1 tablet (15 mg total) by mouth at bedtime. 90 tablet 1  . mirtazapine (REMERON) 7.5 MG tablet Take 7.5 mg by mouth at bedtime.  3   No current facility-administered medications for this visit.    Facility-Administered Medications Ordered in Other Visits  Medication Dose Route Frequency Provider Last Rate Last Dose  . denosumab (XGEVA) injection 120 mg  120 mg Subcutaneous Once Truitt Merle, MD        PHYSICAL EXAMINATION: ECOG PERFORMANCE STATUS: 1 - Symptomatic but completely ambulatory  Vitals:   04/24/18 1116  BP: (!) 160/71  Pulse: 65  Resp: 20  Temp: 98.2 F (36.8 C)  SpO2: 98%   Filed Weights   04/24/18 1116  Weight: 143 lb (64.9 kg)    GENERAL:alert, no distress and comfortable, presents in wheelchair  SKIN: skin color, texture, turgor are normal, no rashes or significant lesions EYES: sclera clear OROPHARYNX:no thrush. Small area of exposed bone to left lower jaw, no surrounding erythema, edema, or drainage LUNGS: clear to auscultation with normal breathing effort HEART: regular rate & rhythm, mild symmetric lower extremity edema ABDOMEN:abdomen soft, non-tender and normal bowel sounds Musculoskeletal:no cyanosis of digits and no clubbing  NEURO: alert & oriented x 3 with fluent speech  LABORATORY DATA:  I have reviewed the data as listed CBC Latest Ref Rng & Units 04/24/2018 04/06/2018 03/27/2018  WBC 3.9 - 10.3 K/uL 3.6(L) 4.4 3.9  Hemoglobin 11.6 - 15.9 g/dL 10.3(L) 10.8(L) 10.4(L)  Hematocrit 34.8 - 46.6 % 31.0(L) 32.2(L) 31.5(L)  Platelets 145 - 400 K/uL 231 201 244     CMP Latest Ref Rng & Units 04/24/2018 04/06/2018 03/27/2018  Glucose 70 - 99 mg/dL 123(H) 101(H) 123(H)  BUN 8 - 23 mg/dL 20 13 17   Creatinine 0.44 - 1.00 mg/dL 1.41(H) 1.07(H) 1.20(H)  Sodium 135 - 145 mmol/L 140 139 142  Potassium 3.5 - 5.1 mmol/L 4.1 4.1 3.6  Chloride 98 - 111  mmol/L 105 104 108  CO2 22 - 32 mmol/L 26 26 23   Calcium 8.9 - 10.3 mg/dL 9.1 9.5 8.6(L)  Total Protein 6.5 - 8.1  g/dL 6.7 7.4 6.7  Total Bilirubin 0.3 - 1.2 mg/dL 0.5 0.7 0.6  Alkaline Phos 38 - 126 U/L 67 112 68  AST 15 - 41 U/L 15 19 13(L)  ALT 0 - 44 U/L 7 18 11    MM LAB:  M-protein (g/dl) 10/19/2016: 0.4 11/01/2016: Not Det 12/05/16: Not Det 01/02/17: Not det 02/27/17: Not det 03/24/17: not det.  04/24/17: not det.  05/19/17: not det. 06/16/17:: not det 07/21/17: not det 08/18/17: not det 09/15/17: not det 10/27/17: Not det 11/24/17: not det 12/22/17: not det 01/30/18: not det 02/27/18: not det 03/27/18: not det 04/24/18 PENDING   Serum kappa, lamda light chains and ratio (mg/L) 10/21/2016: 14.8, 5362, 0.00 11/01/2016: 1.49, 10.88, 0.14 12/05/2016: 2.16, 19.31, 0.11 01/02/17: 3.01, 10.90, 0.28 02/27/17: 1.49, 20.64, 0.07 03/24/17: 2.19, 3.90, 0.56 04/24/17: 2.58, 4.02, 0.64 05/19/17: 2.22, 3.55, 0.63 06/16/17:: 4.18, 5.55, 0.75 07/21/17: 3.86, 5.10, 0.76 08/18/17: 2.90, 3.58, 0.81 09/15/17: 5.53. 5.47, 1.01 10/27/17: 7.86, 8.58, 0.92 11/24/17: 8.86, 8.16, 1.09 12/22/17: 7.79, 6.77, 1.15 01/30/18: 5.79, 4.70, 1.23 02/27/18: 5.27, 4.30, 1.23 03/27/18: 5.98, 5.97, 1.00 04/24/18: PENDING    24 hour urine (total protein, free lambda, ration) 10/21/2016: 1463, 1570, 0.06   PATHOLOGY  Diagnosis 01/19/2017 Femoral head, fracture, Right - FRACTURE, SEE COMMENT. Microscopic Comment There are scattered plasma cells seen with CD138, predominately in the area of fracture. Light chain in situ hybridization is non-contributory, likely due to heavy decalcification. Overt plasma cell neoplasm is not appreciated.  Interpretation 10/25/2016 Bone Marrow Flow Cytometry - NO MONOCLONAL B-CELL OR PHENOTYPICALLY ABERRANT T-CELL POPULATION IDENTIFIED.  Diagnosis 10/25/2016 Bone Marrow, Aspirate,Biopsy, and Clot - PLASMA CELL MYELOMA. - SEE COMMENT. PERIPHERAL BLOOD: - NORMOCYTIC ANEMIA. Diagnosis  Note Overall, the marrow is hypercellular with increased lambda-restricted plasma cells (62% aspirate, 90% CD138). There is partial expression of CD20, but no monoclonal B-cell population is detected by flow cytometry.       RADIOGRAPHIC STUDIES: I have personally reviewed the radiological images as listed and agreed with the findings in the report. No results found.   ASSESSMENT & PLAN: 84 y.o.woman with HTN and CKD, presented with hypercalcemia, worsening anemia since 06/2016, s/p multiple fall, multiple hospitalization recently for back pain , CT scan showedT2 vertebral body compression fracture with mild height loss.  1. Multiple Myeloma, lambda light chain disease, staging III, intermediate risk (13q-) 2. Anemia, secondary to MM and Revlimid  3. Hypercalcemia/hypocalcemia - previously on Boniva, then Aredia; eventually switched to Xgeva q4 weeks on 01/02/17 due to poor IV access  4. CKD, stage II-III 5. Severe back pain and right hip pain secondary to pathologic fracture - T2 compression fracture; pain control with morphine BID and oxycodone PRN 6. HTN 7. Osteoporosis and fractures  8. Goals of care discussion  9. Anorexia - stable appetite on mirtazapine and megace  10. Bilateral ankle wound, right LE PVD s/p right LE surgery per Vascular surgeon Dr. Brunetta Jeans at [facility] 12/26/17, wound previously resolved  11. 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 Townsend appears stable. She completed oral antibiotics for osteonecrosis vs abscess. Her dental pain and swelling have improved, physical exam reveals exposed bone without other obvious signs of infection. She continues medicated mouthwash per dentist Dr. Sabra Heck and she will f/u there on 9/12. CBC is stable. Given exam findings, I think its safe  to restart Revlimid  5 mg daily for 3 weeks on, 1 week off. She can restart this week. I urged her to call back and/or see her dentist for signs of recurrent infection, which we reviewed today. She agrees. Will continue holding Xgeva.   CMP reviewed: Cr. Slightly increased to 1.4; I recommend she increase po fluids. MM Panel pending. She has increased drowsiness on oxycodone 10 mg, I recommend to try 0.5 - 1 tablet PRN for moderate to severe pain. She agrees to try. I refilled morphine and oxycodone today.   She will return for lab and f/u with Dr. Burr Medico in 4 weeks. The plan was reviewed with Dr. Burr Medico.   PLAN: -Labs reviewed -Increase po liquids for elevated cr.  -Restart Revlimid 5 mg this week, for 3 weeks on and 1 week off -F/u with dentist Dr. Sabra Heck, continue medicated mouthwash and oral care per instructions  -Refilled [name] and oxycodone -lab, f/u with Dr. Burr Medico in 4 weeks   All questions were answered. The patient knows to call the clinic with any problems, questions or concerns. No barriers to learning was detected. I spent 20 minutes counseling the patient face to face. The total time spent in the appointment was 25 minutes and more than 50% was on counseling and review of test results     Alla Feeling, NP 04/24/18

## 2018-04-24 NOTE — Telephone Encounter (Signed)
Gave pt avs and calendar  °

## 2018-04-25 LAB — MULTIPLE MYELOMA PANEL, SERUM
ALPHA2 GLOB SERPL ELPH-MCNC: 0.6 g/dL (ref 0.4–1.0)
Albumin SerPl Elph-Mcnc: 3.2 g/dL (ref 2.9–4.4)
Albumin/Glob SerPl: 1.1 (ref 0.7–1.7)
Alpha 1: 0.2 g/dL (ref 0.0–0.4)
B-GLOBULIN SERPL ELPH-MCNC: 0.9 g/dL (ref 0.7–1.3)
GAMMA GLOB SERPL ELPH-MCNC: 1.4 g/dL (ref 0.4–1.8)
GLOBULIN, TOTAL: 3.2 g/dL (ref 2.2–3.9)
IGG (IMMUNOGLOBIN G), SERUM: 1444 mg/dL (ref 700–1600)
IgA: 506 mg/dL — ABNORMAL HIGH (ref 64–422)
IgM (Immunoglobulin M), Srm: 20 mg/dL — ABNORMAL LOW (ref 26–217)
Total Protein ELP: 6.4 g/dL (ref 6.0–8.5)

## 2018-04-25 LAB — KAPPA/LAMBDA LIGHT CHAINS
KAPPA FREE LGHT CHN: 46.3 mg/L — AB (ref 3.3–19.4)
Kappa, lambda light chain ratio: 1.12 (ref 0.26–1.65)
Lambda free light chains: 41.2 mg/L — ABNORMAL HIGH (ref 5.7–26.3)

## 2018-04-26 ENCOUNTER — Encounter: Payer: Medicare Other | Admitting: Physical Therapy

## 2018-04-30 ENCOUNTER — Encounter: Payer: Self-pay | Admitting: Physical Therapy

## 2018-04-30 ENCOUNTER — Ambulatory Visit: Payer: Medicare Other | Admitting: Physical Therapy

## 2018-04-30 DIAGNOSIS — M25611 Stiffness of right shoulder, not elsewhere classified: Secondary | ICD-10-CM

## 2018-04-30 DIAGNOSIS — M25511 Pain in right shoulder: Secondary | ICD-10-CM

## 2018-04-30 DIAGNOSIS — M25551 Pain in right hip: Secondary | ICD-10-CM

## 2018-04-30 DIAGNOSIS — M6281 Muscle weakness (generalized): Secondary | ICD-10-CM

## 2018-04-30 NOTE — Therapy (Signed)
[facility] [address] [place], [place], [address] Phone: [phone]   Fax:  [phone]  Physical Therapy Treatment  Patient Details  Name: Kristin [name] MRN: [id number] Date of Birth: Jan 08, 1931 Referring Provider: Yaakov Guthrie, MD    Encounter Date: 04/30/2018  PT End of Session - 04/30/18 1436    Visit Number  8    Date for PT Re-Evaluation  05/19/18    Authorization Type  Med pay    Authorization Time Period  03/19/18 to 05/19/18    Authorization - Visit Number  8    Authorization - Number of Visits  10    PT Start Time  1638    PT Stop Time  4536    PT Time Calculation (min)  38 min    Activity Tolerance  Patient tolerated treatment well;No increased pain    Behavior During Therapy  WFL for tasks assessed/performed       Past Medical History:  Diagnosis Date  . Bone cancer [facility])    "being tx'd by Dr. Burr Medico @ [facility]" (10/09/2017)  . Chronic kidney disease (CKD), stage III (moderate) (HCC)   . Depression    Risperdal "twice/day" (09/23/2016)  . Glaucoma   . Hiatal hernia   . Hypertension   . Intractable back pain    /notes 09/23/2016  . Obesity   . Osteoarthritis    "all over" (10/09/2017)  . Osteoporosis    vertebral fracture in 2012  . Peripheral vascular disease ([place])   . Upper GI bleed 04/2004   secondary to Mallory-Weiss tear/notes 12/28/2010  . Vertigo     Past Surgical History:  Procedure Laterality Date  . ABDOMINAL HYSTERECTOMY  1974   partial  . APPENDECTOMY     as a child/notes 12/28/2010  . BACK SURGERY    . BREAST BIOPSY Left 1999   Archie Endo 12/28/2010  . CATARACT EXTRACTION W/ INTRAOCULAR LENS  IMPLANT, BILATERAL Bilateral 2000s  . ESOPHAGOGASTRODUODENOSCOPY  04/2004   Archie Endo 12/28/2010  . FIXATION KYPHOPLASTY THORACIC SPINE  05/2011   T10/notes 05/25/2011  . FRACTURE SURGERY    . HIP ARTHROPLASTY Right 01/19/2017   Procedure: RIGHT HIP HEMIARTHROPLASTY;  Surgeon: Renette Butters, MD;  Location: [place];  Service: Orthopedics;  Laterality: Right;  . LAPAROSCOPIC CHOLECYSTECTOMY  10/2007   Archie Endo 12/14/2010  . LOWER EXTREMITY ANGIOGRAPHY N/A 10/09/2017   Procedure: LOWER EXTREMITY ANGIOGRAPHY;  Surgeon: Lorretta Harp, MD;  Location: [place] [facility];  Service: Cardiovascular;  Laterality: N/A;  bilateral  . LOWER EXTREMITY ANGIOGRAPHY  11/21/2017   Procedure: Lower Extremity Angiography;  Surgeon: Adrian Prows, MD;  Location: [place] [facility];  Service: Cardiovascular;;  . PERIPHERAL VASCULAR BALLOON ANGIOPLASTY  10/09/2017   Attempted PTA Archie Endo 10/09/2017  . PERIPHERAL VASCULAR BALLOON ANGIOPLASTY  10/09/2017   Procedure: PERIPHERAL VASCULAR BALLOON ANGIOPLASTY;  Surgeon: Lorretta Harp, MD;  Location: [place] [facility];  Service: Cardiovascular;;  Attempted PTA     There were no vitals filed for this visit.  Subjective Assessment - 04/30/18 1438    Subjective  I am feeling ok today. No pain right now, had some in my Rt side this AM.     Pertinent History  back surgery, Rt THA (posterior), multiple myeloma    Currently in Pain?  No/denies    Multiple Pain Sites  No         [facility] PT Assessment - 04/30/18 0001  AROM   Overall AROM Comments  RT shoiulder scaption 105 degrees                   [facility] Adult PT Treatment/Exercise - 04/30/18 0001      Lumbar Exercises: Aerobic   Nustep  L2 x 10 min   PTA present to discuss status/progress     Lumbar Exercises: Standing   Heel Raises  20 reps   Standing hip abduction at counter top 10x bil     Lumbar Exercises: Seated   Long Arc Quad on Chair  Strengthening;Both;2 sets;15 reps;Weights    LAQ on Chair Weights (lbs)  2    Other Seated Lumbar Exercises  seated alternating march x15 reps each    added 2#, VC to slow speed   Other Seated Lumbar Exercises  clamshell with red TB 2x15 reps; adduction ball squeeze x25 reps    Only instructional cues     Shoulder Exercises: Seated    Flexion  Strengthening;Both;10 reps;Weights   1#   Flexion Weight (lbs)  --   Seated rows yellow band 2x10, TC for correct technique   Other Seated Exercises  B horizontal abduction 2x10 reps    VC to slow speed     Shoulder Exercises: Pulleys   Flexion  3 minutes   PTA present to discuss status.    Scaption  3 minutes               PT Short Term Goals - 04/30/18 1456      PT SHORT TERM GOAL #1   Title  Pt will demo consistency and independence with HEP to improve ROM and decrease pain.    Time  4    Period  Weeks    Status  On-going   Unsure of consistency     PT SHORT TERM GOAL #2   Title  Pt will demo improved Rt shoulder flexion active ROM to atleast 120 deg which will assist with her ability to reach over head into her cabinet.    Time  4    Period  Weeks    Status  On-going   105 today on scap plane       PT Long Term Goals - 04/02/18 1133      PT LONG TERM GOAL #1   Title  Pt will complete 5x sit to stand in less than 14 sec without UE assistance, to reflect increase in LE strength and power.    Time  8    Period  Weeks    Status  Achieved   13 sec           Plan - 04/30/18 1511    Clinical Impression Statement  Pt reports she feels 40% improved since the start of PT. She met her STG for pain. She still has not consistently met the flexion goal of 120 degrees, today she could only lift her RTUE to 105 degrees. She typically has RT side pain in the morning, when asked what she does when she feels the feels the pain, she reports stretching and doing "exercises." This abolishes the pain. This is the  only time she does exercises.     PT Next Visit Plan  Nustep for LE/hip strength with the arms, shoulder isometrics, gentle trunk ROM. Pt will be by herself today and tomorrow so she will have to reach into the cabinets by herself. Ask how she did doing this.     PT Home Exercise  Plan  [id number]    Consulted and Agree with Plan of Care  Patient        Patient will benefit from skilled therapeutic intervention in order to improve the following deficits and impairments:  Abnormal gait, Decreased activity tolerance, Decreased strength, Impaired flexibility, Impaired UE functional use, Postural dysfunction, Pain, Improper body mechanics, Decreased range of motion, Difficulty walking, Decreased endurance, Increased muscle spasms, Hypomobility  Visit Diagnosis: Right shoulder pain, unspecified chronicity  Pain in right hip  Muscle weakness (generalized)  Stiffness of right shoulder, not elsewhere classified     Problem List Patient Active Problem List   Diagnosis Date Noted  . Critical lower limb ischemia 10/04/2017  . Advance care planning   . Goals of care, counseling/discussion   . Palliative care by specialist   . CKD (chronic kidney disease), stage III ([place]) 01/18/2017  . Hip fracture, pathological ([place]) 01/18/2017  . Pathologic hip fracture ([name]) 01/18/2017  . DNR (do not resuscitate) discussion 11/01/2016  . Multiple myeloma ([place]) 10/31/2016  . Acute encephalopathy 10/18/2016  . Hypercalcemia 10/18/2016  . Fall   . Anemia in neoplastic disease 09/23/2016  . Sepsis ([place]) 09/23/2016  . Back pain 09/23/2016  . Intractable back pain 06/20/2016  . Other malaise and fatigue 11/26/2013  . Vertigo   . Obesity   . Arthritis of knee, left 07/09/2013  . Need for prophylactic vaccination and inoculation against influenza 07/09/2013  . Osteoporosis with pathological fracture 01/01/2013  . HLD (hyperlipidemia) 01/01/2013  . HTN (hypertension) 01/01/2013  . Chronic back pain 01/01/2013  . Vertebral compression fracture ([place]) 01/01/2013  . Seasonal allergic rhinitis 01/01/2013    Twan Harkin, PTA 04/30/2018, 3:15 PM  [facility] [address], [place], [place], [address] Phone: [phone]   Fax:  [phone]  Name: Kristin [name] MRN: [id number] Date  of Birth: August 04, 1931

## 2018-05-02 ENCOUNTER — Ambulatory Visit: Payer: Medicare Other | Admitting: Physical Therapy

## 2018-05-02 ENCOUNTER — Encounter: Payer: Self-pay | Admitting: Physical Therapy

## 2018-05-02 DIAGNOSIS — M25611 Stiffness of right shoulder, not elsewhere classified: Secondary | ICD-10-CM

## 2018-05-02 DIAGNOSIS — M25511 Pain in right shoulder: Secondary | ICD-10-CM

## 2018-05-02 DIAGNOSIS — M6281 Muscle weakness (generalized): Secondary | ICD-10-CM

## 2018-05-02 DIAGNOSIS — M25551 Pain in right hip: Secondary | ICD-10-CM

## 2018-05-02 NOTE — Therapy (Signed)
[facility] [address] [place], [place], [address] Phone: [phone]   Fax:  [phone]  Physical Therapy Treatment  Patient Details  Name: Kristin [name] MRN: [id number] Date of Birth: 1930/09/13 Referring Provider: Yaakov Guthrie, MD    Encounter Date: 05/02/2018  PT End of Session - 05/02/18 1019    Visit Number  9    Date for PT Re-Evaluation  05/19/18    Authorization Type  Med pay    Authorization Time Period  03/19/18 to 05/19/18    Authorization - Visit Number  9    Authorization - Number of Visits  10    PT Start Time  1019    PT Stop Time  1100    PT Time Calculation (min)  41 min    Activity Tolerance  Patient tolerated treatment well;No increased pain    Behavior During Therapy  WFL for tasks assessed/performed       Past Medical History:  Diagnosis Date  . Bone cancer [facility])    "being tx'd by Dr. Burr Medico @ [facility]" (10/09/2017)  . Chronic kidney disease (CKD), stage III (moderate) (HCC)   . Depression    Risperdal "twice/day" (09/23/2016)  . Glaucoma   . Hiatal hernia   . Hypertension   . Intractable back pain    /notes 09/23/2016  . Obesity   . Osteoarthritis    "all over" (10/09/2017)  . Osteoporosis    vertebral fracture in 2012  . Peripheral vascular disease ([place])   . Upper GI bleed 04/2004   secondary to Mallory-Weiss tear/notes 12/28/2010  . Vertigo     Past Surgical History:  Procedure Laterality Date  . ABDOMINAL HYSTERECTOMY  1974   partial  . APPENDECTOMY     as a child/notes 12/28/2010  . BACK SURGERY    . BREAST BIOPSY Left 1999   Archie Endo 12/28/2010  . CATARACT EXTRACTION W/ INTRAOCULAR LENS  IMPLANT, BILATERAL Bilateral 2000s  . ESOPHAGOGASTRODUODENOSCOPY  04/2004   Archie Endo 12/28/2010  . FIXATION KYPHOPLASTY THORACIC SPINE  05/2011   T10/notes 05/25/2011  . FRACTURE SURGERY    . HIP ARTHROPLASTY Right 01/19/2017   Procedure: RIGHT HIP HEMIARTHROPLASTY;  Surgeon: Renette Butters, MD;  Location: [place];  Service: Orthopedics;  Laterality: Right;  . LAPAROSCOPIC CHOLECYSTECTOMY  10/2007   Archie Endo 12/14/2010  . LOWER EXTREMITY ANGIOGRAPHY N/A 10/09/2017   Procedure: LOWER EXTREMITY ANGIOGRAPHY;  Surgeon: Lorretta Harp, MD;  Location: [place] [facility];  Service: Cardiovascular;  Laterality: N/A;  bilateral  . LOWER EXTREMITY ANGIOGRAPHY  11/21/2017   Procedure: Lower Extremity Angiography;  Surgeon: Adrian Prows, MD;  Location: [place] [facility];  Service: Cardiovascular;;  . PERIPHERAL VASCULAR BALLOON ANGIOPLASTY  10/09/2017   Attempted PTA Archie Endo 10/09/2017  . PERIPHERAL VASCULAR BALLOON ANGIOPLASTY  10/09/2017   Procedure: PERIPHERAL VASCULAR BALLOON ANGIOPLASTY;  Surgeon: Lorretta Harp, MD;  Location: [place] [facility];  Service: Cardiovascular;;  Attempted PTA     There were no vitals filed for this visit.  Subjective Assessment - 05/02/18 1020    Subjective  No pain this morning. I am trying hard to use my arms and legs.     Pertinent History  back surgery, Rt THA (posterior), multiple myeloma    Currently in Pain?  No/denies    Multiple Pain Sites  No  [place] Adult PT Treatment/Exercise - 05/02/18 0001      Lumbar Exercises: Aerobic   Nustep  L2 x 10 min   PTA present to discuss status/progress     Lumbar Exercises: Standing   Heel Raises  20 reps   Standing hip abduction at counter top 10x bil     Lumbar Exercises: Seated   Long Arc Quad on Chair  Strengthening;Both;2 sets;10 reps;Weights    LAQ on Chair Weights (lbs)  --   2.5   Other Seated Lumbar Exercises  seated alternating march 2x 15  reps each    added 2.5#   Other Seated Lumbar Exercises  clamshell with red TB 2x15 reps; adduction ball squeeze x25 reps    Only instructional cues     Shoulder Exercises: Seated   Flexion  Strengthening;Both;20 reps;Weights   2x10   Flexion Weight (lbs)  1      Shoulder Exercises: Standing   Other  Standing Exercises  Finger ladder RTUE flexion 5x       Shoulder Exercises: Pulleys   Flexion  3 minutes   PTA present to discuss status.    Scaption  3 minutes               PT Short Term Goals - 04/30/18 1456      PT SHORT TERM GOAL #1   Title  Pt will demo consistency and independence with HEP to improve ROM and decrease pain.    Time  4    Period  Weeks    Status  On-going   Unsure of consistency     PT SHORT TERM GOAL #2   Title  Pt will demo improved Rt shoulder flexion active ROM to atleast 120 deg which will assist with her ability to reach over head into her cabinet.    Time  4    Period  Weeks    Status  On-going   105 today on scap plane       PT Long Term Goals - 04/02/18 1133      PT LONG TERM GOAL #1   Title  Pt will complete 5x sit to stand in less than 14 sec without UE assistance, to reflect increase in LE strength and power.    Time  8    Period  Weeks    Status  Achieved   13 sec           Plan - 05/02/18 1019    Clinical Impression Statement  Pt reports she has been on her own for longer hours the last few days and feels she has been able to do for herself fairly well. She presents again without pain. tried the finger ladder in standing to get RTUE stretching higher. She reported some soreness doing this but finished 5x. Increased LAQ to 2.5#  for continued quad strength.     Rehab Potential  Good    PT Frequency  2x / week    PT Duration  8 weeks    PT Treatment/Interventions  ADLs/Self Care Home Management;Moist Heat;Electrical Stimulation;Cryotherapy;Therapeutic exercise;Therapeutic activities;Functional mobility training;Balance training;Neuromuscular re-education;Patient/family education;Manual techniques;Passive range of motion;Dry needling;Taping    PT Next Visit Plan  Nustep for LE/hip strength with the arms, shoulder strength,  gentle trunk ROM.       PT Home Exercise Plan  [id number]    Consulted and Agree with Plan of Care   Patient       Patient will benefit from skilled therapeutic intervention in  order to improve the following deficits and impairments:  Abnormal gait, Decreased activity tolerance, Decreased strength, Impaired flexibility, Impaired UE functional use, Postural dysfunction, Pain, Improper body mechanics, Decreased range of motion, Difficulty walking, Decreased endurance, Increased muscle spasms, Hypomobility  Visit Diagnosis: Right shoulder pain, unspecified chronicity  Pain in right hip  Muscle weakness (generalized)  Stiffness of right shoulder, not elsewhere classified     Problem List Patient Active Problem List   Diagnosis Date Noted  . Critical lower limb ischemia 10/04/2017  . Advance care planning   . Goals of care, counseling/discussion   . Palliative care by specialist   . CKD (chronic kidney disease), stage III ([place]) 01/18/2017  . Hip fracture, pathological ([place]) 01/18/2017  . Pathologic hip fracture ([place]) 01/18/2017  . DNR (do not resuscitate) discussion 11/01/2016  . Multiple myeloma ([place]) 10/31/2016  . Acute encephalopathy 10/18/2016  . Hypercalcemia 10/18/2016  . Fall   . Anemia in neoplastic disease 09/23/2016  . Sepsis ([place]) 09/23/2016  . Back pain 09/23/2016  . Intractable back pain 06/20/2016  . Other malaise and fatigue 11/26/2013  . Vertigo   . Obesity   . Arthritis of knee, left 07/09/2013  . Need for prophylactic vaccination and inoculation against influenza 07/09/2013  . Osteoporosis with pathological fracture 01/01/2013  . HLD (hyperlipidemia) 01/01/2013  . HTN (hypertension) 01/01/2013  . Chronic back pain 01/01/2013  . Vertebral compression fracture ([place]) 01/01/2013  . Seasonal allergic rhinitis 01/01/2013    Kalya Troeger, PTA 05/02/2018, 10:47 AM  [facility] [address], [place], [place], [address] Phone: [phone]   Fax:  [phone]  Name: Kristin [name] MRN:  [id number] Date of Birth: 21-May-1931

## 2018-05-07 ENCOUNTER — Ambulatory Visit: Payer: Medicare Other | Admitting: Physical Therapy

## 2018-05-07 ENCOUNTER — Encounter: Payer: Self-pay | Admitting: Physical Therapy

## 2018-05-07 DIAGNOSIS — M25511 Pain in right shoulder: Secondary | ICD-10-CM | POA: Diagnosis not present

## 2018-05-07 DIAGNOSIS — M25611 Stiffness of right shoulder, not elsewhere classified: Secondary | ICD-10-CM

## 2018-05-07 DIAGNOSIS — M6281 Muscle weakness (generalized): Secondary | ICD-10-CM

## 2018-05-07 DIAGNOSIS — M25551 Pain in right hip: Secondary | ICD-10-CM

## 2018-05-07 NOTE — Therapy (Signed)
[name] [address] [place], [place], [address] Phone: [phone]   Fax:  [phone]  Physical Therapy Treatment  Patient Details  Name: Kristin [name] MRN: [id number] Date of Birth: [birth year] Referring Provider: Yaakov Guthrie, MD    Encounter Date: 05/07/2018  PT End of Session - 05/07/18 1014    Visit Number  10    Date for PT Re-Evaluation  05/19/18    Authorization Type  Med pay    Authorization Time Period  03/19/18 to 05/19/18    Authorization - Visit Number  10    Authorization - Number of Visits  20    PT Start Time  1014   pain  limited session   PT Stop Time  1100    PT Time Calculation (min)  46 min    Activity Tolerance  Patient tolerated treatment well;No increased pain    Behavior During Therapy  WFL for tasks assessed/performed       Past Medical History:  Diagnosis Date  . Bone cancer [facility])    "being tx'd by Dr. Burr Medico @ [facility]" (10/09/2017)  . Chronic kidney disease (CKD), stage III (moderate) (HCC)   . Depression    Risperdal "twice/day" (09/23/2016)  . Glaucoma   . Hiatal hernia   . Hypertension   . Intractable back pain    /notes 09/23/2016  . Obesity   . Osteoarthritis    "all over" (10/09/2017)  . Osteoporosis    vertebral fracture in 2012  . Peripheral vascular disease ([place])   . Upper GI bleed 04/2004   secondary to Mallory-Weiss tear/notes 12/28/2010  . Vertigo     Past Surgical History:  Procedure Laterality Date  . ABDOMINAL HYSTERECTOMY  1974   partial  . APPENDECTOMY     as a child/notes 12/28/2010  . BACK SURGERY    . BREAST BIOPSY Left 1999   Archie Endo 12/28/2010  . CATARACT EXTRACTION W/ INTRAOCULAR LENS  IMPLANT, BILATERAL Bilateral 2000s  . ESOPHAGOGASTRODUODENOSCOPY  04/2004   Archie Endo 12/28/2010  . FIXATION KYPHOPLASTY THORACIC SPINE  05/2011   T10/notes 05/25/2011  . FRACTURE SURGERY    . HIP ARTHROPLASTY Right 01/19/2017   Procedure: RIGHT HIP  HEMIARTHROPLASTY;  Surgeon: Renette Butters, MD;  Location: [facility];  Service: Orthopedics;  Laterality: Right;  . LAPAROSCOPIC CHOLECYSTECTOMY  10/2007   Archie Endo 12/14/2010  . LOWER EXTREMITY ANGIOGRAPHY N/A 10/09/2017   Procedure: LOWER EXTREMITY ANGIOGRAPHY;  Surgeon: Lorretta Harp, MD;  Location: [place] [facility];  Service: Cardiovascular;  Laterality: N/A;  bilateral  . LOWER EXTREMITY ANGIOGRAPHY  11/21/2017   Procedure: Lower Extremity Angiography;  Surgeon: Adrian Prows, MD;  Location: [place] [facility];  Service: Cardiovascular;;  . PERIPHERAL VASCULAR BALLOON ANGIOPLASTY  10/09/2017   Attempted PTA Archie Endo 10/09/2017  . PERIPHERAL VASCULAR BALLOON ANGIOPLASTY  10/09/2017   Procedure: PERIPHERAL VASCULAR BALLOON ANGIOPLASTY;  Surgeon: Lorretta Harp, MD;  Location: [place] [facility];  Service: Cardiovascular;;  Attempted PTA     There were no vitals filed for this visit.  Subjective Assessment - 05/07/18 1018    Subjective  Not a good weekend, reports she had more frequent pain in her right side. Had some this morning but no pain right now. Reports the pain medication helped when she took it over the weekend.     Pertinent History  back surgery, Rt THA (posterior), multiple myeloma    Currently in Pain?  No/denies    Multiple  Pain Sites  No         [facility] PT Assessment - 05/07/18 0001      AROM   Overall AROM Comments  RT shoulder scaption 113 degrees                   [facility] Adult PT Treatment/Exercise - 05/07/18 0001      Lumbar Exercises: Supine   Other Supine Lumbar Exercises  Semi reclined; tried to bend Rt knee and get foot flat on the mat as this streched inher RT hip 3x 20 sec      Shoulder Exercises: Pulleys   Flexion  3 minutes    Scaption  3 minutes      Moist Heat Therapy   Number Minutes Moist Heat  10 Minutes    Moist Heat Location  --   Rt hip post session     Manual Therapy   Manual therapy comments  soft tissue wotk to RT TFL, RF,  around the greater trochanter, pt is semi reclined  position                PT Short Term Goals - 05/07/18 1053      PT SHORT TERM GOAL #1   Title  Pt will demo consistency and independence with HEP to improve ROM and decrease pain.    Time  4    Period  Weeks    Status  Achieved      PT SHORT TERM GOAL #2   Title  Pt will demo improved Rt shoulder flexion active ROM to atleast 120 deg which will assist with her ability to reach over head into her cabinet.    Time  4    Period  Weeks    Status  On-going   113 degrees today     PT SHORT TERM GOAL #3   Title  Pt will report atleast 30% improvement in her pain from the start of PT.    Time  4    Period  Weeks    Status  Achieved        PT Long Term Goals - 05/07/18 1053      PT LONG TERM GOAL #1   Title  Pt will complete 5x sit to stand in less than 14 sec without UE assistance, to reflect increase in LE strength and power.    Time  8    Period  Weeks    Status  Achieved      PT LONG TERM GOAL #4   Title  Pt will report being able to pick up a small pot of tea without the need for caregiver assistance.     Time  8    Period  Weeks    Status  On-going   Reports she hasn't tried but thinks she would have no problem doing this on her on.           Plan - 05/07/18 1014    Clinical Impression Statement  Pt reports she was in more frequent pain this weekend including this AM ( which is typical, pain in the morning). tretament focused on the soft tissues of her RT hip. Active TPs along the TFL and proximal quads. general soft tissue tightness  throughout the anteriolateral hip.  Gave pt 10 min of heat post manual to decrease post soreness.     Rehab Potential  Good    PT Frequency  2x / week    PT Duration  8 weeks    PT Treatment/Interventions  ADLs/Self Care Home Management;Moist Heat;Electrical Stimulation;Cryotherapy;Therapeutic exercise;Therapeutic activities;Functional mobility training;Balance  training;Neuromuscular re-education;Patient/family education;Manual techniques;Passive range of motion;Dry needling;Taping    PT Next Visit Plan  Concentrate on RT hip mobility and strength    PT Home Exercise Plan  [id number]    Consulted and Agree with Plan of Care  Patient       Patient will benefit from skilled therapeutic intervention in order to improve the following deficits and impairments:  Abnormal gait, Decreased activity tolerance, Decreased strength, Impaired flexibility, Impaired UE functional use, Postural dysfunction, Pain, Improper body mechanics, Decreased range of motion, Difficulty walking, Decreased endurance, Increased muscle spasms, Hypomobility  Visit Diagnosis: Right shoulder pain, unspecified chronicity  Pain in right hip  Muscle weakness (generalized)  Stiffness of right shoulder, not elsewhere classified     Problem List Patient Active Problem List   Diagnosis Date Noted  . Critical lower limb ischemia 10/04/2017  . Advance care planning   . Goals of care, counseling/discussion   . Palliative care by specialist   . CKD (chronic kidney disease), stage III ([place]) 01/18/2017  . Hip fracture, pathological ([place]) 01/18/2017  . Pathologic hip fracture ([place]) 01/18/2017  . DNR (do not resuscitate) discussion 11/01/2016  . Multiple myeloma ([place]) 10/31/2016  . Acute encephalopathy 10/18/2016  . Hypercalcemia 10/18/2016  . Fall   . Anemia in neoplastic disease 09/23/2016  . Sepsis ([place]) 09/23/2016  . Back pain 09/23/2016  . Intractable back pain 06/20/2016  . Other malaise and fatigue 11/26/2013  . Vertigo   . Obesity   . Arthritis of knee, left 07/09/2013  . Need for prophylactic vaccination and inoculation against influenza 07/09/2013  . Osteoporosis with pathological fracture 01/01/2013  . HLD (hyperlipidemia) 01/01/2013  . HTN (hypertension) 01/01/2013  . Chronic back pain 01/01/2013  . Vertebral compression fracture ([place]) 01/01/2013  . Seasonal  allergic rhinitis 01/01/2013    Myrene Galas, PTA 05/07/18 2:34 PM  [facility] [address], [place], [place], [address] Phone: [phone]   Fax:  [phone]  Name: Kristin [name] MRN: [id number] Date of Birth: 12-17-1930

## 2018-05-09 ENCOUNTER — Ambulatory Visit: Payer: Medicare Other | Admitting: Physical Therapy

## 2018-05-09 ENCOUNTER — Encounter: Payer: Self-pay | Admitting: Physical Therapy

## 2018-05-09 DIAGNOSIS — M25611 Stiffness of right shoulder, not elsewhere classified: Secondary | ICD-10-CM

## 2018-05-09 DIAGNOSIS — M6281 Muscle weakness (generalized): Secondary | ICD-10-CM

## 2018-05-09 DIAGNOSIS — M25511 Pain in right shoulder: Secondary | ICD-10-CM | POA: Diagnosis not present

## 2018-05-09 DIAGNOSIS — M25551 Pain in right hip: Secondary | ICD-10-CM

## 2018-05-09 NOTE — Therapy (Signed)
[facility] [address], [place], [place], [address] Phone: [phone]   Fax:  [phone]  Physical Therapy Treatment  Patient Details  Name: Kristin [name] MRN: [id number] Date of Birth: 06/20/1931 Referring Provider: Yaakov Guthrie, MD    Encounter Date: 05/09/2018  PT End of Session - 05/09/18 1014    Visit Number  11    Date for PT Re-Evaluation  05/19/18    Authorization Type  Med pay    Authorization Time Period  03/19/18 to 05/19/18    Authorization - Visit Number  11    Authorization - Number of Visits  20    PT Start Time  9924   pt got sick and had to leave   PT Stop Time  1027    PT Time Calculation (min)  13 min    Activity Tolerance  Patient tolerated treatment well;No increased pain    Behavior During Therapy  WFL for tasks assessed/performed       Past Medical History:  Diagnosis Date  . Bone cancer [facility])    "being tx'd by Dr. Burr Medico @ [facility]" (10/09/2017)  . Chronic kidney disease (CKD), stage III (moderate) (HCC)   . Depression    Risperdal "twice/day" (09/23/2016)  . Glaucoma   . Hiatal hernia   . Hypertension   . Intractable back pain    /notes 09/23/2016  . Obesity   . Osteoarthritis    "all over" (10/09/2017)  . Osteoporosis    vertebral fracture in 2012  . Peripheral vascular disease ([place])   . Upper GI bleed 04/2004   secondary to Mallory-Weiss tear/notes 12/28/2010  . Vertigo     Past Surgical History:  Procedure Laterality Date  . ABDOMINAL HYSTERECTOMY  1974   partial  . APPENDECTOMY     as a child/notes 12/28/2010  . BACK SURGERY    . BREAST BIOPSY Left 1999   Archie Endo 12/28/2010  . CATARACT EXTRACTION W/ INTRAOCULAR LENS  IMPLANT, BILATERAL Bilateral 2000s  . ESOPHAGOGASTRODUODENOSCOPY  04/2004   Archie Endo 12/28/2010  . FIXATION KYPHOPLASTY THORACIC SPINE  05/2011   T10/notes 05/25/2011  . FRACTURE SURGERY    . HIP ARTHROPLASTY Right 01/19/2017   Procedure: RIGHT HIP  HEMIARTHROPLASTY;  Surgeon: Renette Butters, MD;  Location: [place];  Service: Orthopedics;  Laterality: Right;  . LAPAROSCOPIC CHOLECYSTECTOMY  10/2007   Archie Endo 12/14/2010  . LOWER EXTREMITY ANGIOGRAPHY N/A 10/09/2017   Procedure: LOWER EXTREMITY ANGIOGRAPHY;  Surgeon: Lorretta Harp, MD;  Location: [place] [facility];  Service: Cardiovascular;  Laterality: N/A;  bilateral  . LOWER EXTREMITY ANGIOGRAPHY  11/21/2017   Procedure: Lower Extremity Angiography;  Surgeon: Adrian Prows, MD;  Location: [place] [facility];  Service: Cardiovascular;;  . PERIPHERAL VASCULAR BALLOON ANGIOPLASTY  10/09/2017   Attempted PTA Archie Endo 10/09/2017  . PERIPHERAL VASCULAR BALLOON ANGIOPLASTY  10/09/2017   Procedure: PERIPHERAL VASCULAR BALLOON ANGIOPLASTY;  Surgeon: Lorretta Harp, MD;  Location: [place] [facility];  Service: Cardiovascular;;  Attempted PTA     There were no vitals filed for this visit.  Subjective Assessment - 05/09/18 1016    Subjective  Working on my hip felt good. I forgot to masage it on my own. No current pain.    Pertinent History  back surgery, Rt THA (posterior), multiple myeloma    Currently in Pain?  No/denies    Multiple Pain Sites  No  [facility] Adult PT Treatment/Exercise - 05/09/18 0001      Lumbar Exercises: Aerobic   Nustep  L2 x 6 min   PTA present to discuss current status              PT Short Term Goals - 05/07/18 1053      PT SHORT TERM GOAL #1   Title  Pt will demo consistency and independence with HEP to improve ROM and decrease pain.    Time  4    Period  Weeks    Status  Achieved      PT SHORT TERM GOAL #2   Title  Pt will demo improved Rt shoulder flexion active ROM to atleast 120 deg which will assist with her ability to reach over head into her cabinet.    Time  4    Period  Weeks    Status  On-going   113 degrees today     PT SHORT TERM GOAL #3   Title  Pt will report atleast 30% improvement in her pain from  the start of PT.    Time  4    Period  Weeks    Status  Achieved        PT Long Term Goals - 05/07/18 1053      PT LONG TERM GOAL #1   Title  Pt will complete 5x sit to stand in less than 14 sec without UE assistance, to reflect increase in LE strength and power.    Time  8    Period  Weeks    Status  Achieved      PT LONG TERM GOAL #4   Title  Pt will report being able to pick up a small pot of tea without the need for caregiver assistance.     Time  8    Period  Weeks    Status  On-going   Reports she hasn't tried but thinks she would have no problem doing this on her on.           Plan - 05/09/18 1015    Clinical Impression Statement  Pt asked to stop while on the Nustep because she was feeling sick to her stomach. She reported she wanted to go home. Treatment ended for the day.     Rehab Potential  Good    PT Frequency  2x / week    PT Duration  8 weeks    PT Treatment/Interventions  ADLs/Self Care Home Management;Moist Heat;Electrical Stimulation;Cryotherapy;Therapeutic exercise;Therapeutic activities;Functional mobility training;Balance training;Neuromuscular re-education;Patient/family education;Manual techniques;Passive range of motion;Dry needling;Taping    PT Next Visit Plan  Concentrate on RT hip mobility and strength    PT Home Exercise Plan  [id number]    Consulted and Agree with Plan of Care  Patient       Patient will benefit from skilled therapeutic intervention in order to improve the following deficits and impairments:  Abnormal gait, Decreased activity tolerance, Decreased strength, Impaired flexibility, Impaired UE functional use, Postural dysfunction, Pain, Improper body mechanics, Decreased range of motion, Difficulty walking, Decreased endurance, Increased muscle spasms, Hypomobility  Visit Diagnosis: Right shoulder pain, unspecified chronicity  Pain in right hip  Muscle weakness (generalized)  Stiffness of right shoulder, not elsewhere  classified     Problem List Patient Active Problem List   Diagnosis Date Noted  . Critical lower limb ischemia 10/04/2017  . Advance care planning   . Goals of care, counseling/discussion   . Palliative care by specialist   .  CKD (chronic kidney disease), stage III ([place]) 01/18/2017  . Hip fracture, pathological ([place]) 01/18/2017  . Pathologic hip fracture ([place]) 01/18/2017  . DNR (do not resuscitate) discussion 11/01/2016  . Multiple myeloma ([place]) 10/31/2016  . Acute encephalopathy 10/18/2016  . Hypercalcemia 10/18/2016  . Fall   . Anemia in neoplastic disease 09/23/2016  . Sepsis ([place]) 09/23/2016  . Back pain 09/23/2016  . Intractable back pain 06/20/2016  . Other malaise and fatigue 11/26/2013  . Vertigo   . Obesity   . Arthritis of knee, left 07/09/2013  . Need for prophylactic vaccination and inoculation against influenza 07/09/2013  . Osteoporosis with pathological fracture 01/01/2013  . HLD (hyperlipidemia) 01/01/2013  . HTN (hypertension) 01/01/2013  . Chronic back pain 01/01/2013  . Vertebral compression fracture ([place]) 01/01/2013  . Seasonal allergic rhinitis 01/01/2013    Makai Agostinelli, PTA 05/09/2018, 10:29 AM  [facility] [address] [place], [place], [address] Phone: [phone]   Fax:  [phone]  Name: CAIDYN BLOSSOM MRN: [id number] Date of Birth: 24-Mar-1931

## 2018-05-14 ENCOUNTER — Encounter: Payer: Self-pay | Admitting: Physical Therapy

## 2018-05-14 ENCOUNTER — Ambulatory Visit: Payer: Medicare Other | Admitting: Physical Therapy

## 2018-05-14 DIAGNOSIS — M6281 Muscle weakness (generalized): Secondary | ICD-10-CM

## 2018-05-14 DIAGNOSIS — M25511 Pain in right shoulder: Secondary | ICD-10-CM

## 2018-05-14 DIAGNOSIS — M25551 Pain in right hip: Secondary | ICD-10-CM

## 2018-05-14 DIAGNOSIS — M25611 Stiffness of right shoulder, not elsewhere classified: Secondary | ICD-10-CM

## 2018-05-14 NOTE — Therapy (Signed)
[facility] [address] [place], [place], [address] Phone: [phone]   Fax:  [phone]  Physical Therapy Treatment  Patient Details  Name: Kristin [name] MRN: [id number] Date of Birth: [birth year] Referring Provider (PT): Yaakov Guthrie, MD    Encounter Date: 05/14/2018  PT End of Session - 05/14/18 1048    Visit Number  12    Date for PT Re-Evaluation  05/19/18    Authorization Type  Med pay    Authorization Time Period  03/19/18 to 05/19/18    Authorization - Visit Number  12    Authorization - Number of Visits  20    PT Start Time  8270    PT Stop Time  7867    PT Time Calculation (min)  50 min    Activity Tolerance  Patient tolerated treatment well;No increased pain    Behavior During Therapy  WFL for tasks assessed/performed       Past Medical History:  Diagnosis Date  . Bone cancer [facility])    "being tx'd by Dr. Burr Medico @ [facility]" (10/09/2017)  . Chronic kidney disease (CKD), stage III (moderate) (HCC)   . Depression    Risperdal "twice/day" (09/23/2016)  . Glaucoma   . Hiatal hernia   . Hypertension   . Intractable back pain    /notes 09/23/2016  . Obesity   . Osteoarthritis    "all over" (10/09/2017)  . Osteoporosis    vertebral fracture in 2012  . Peripheral vascular disease ([place])   . Upper GI bleed 04/2004   secondary to Mallory-Weiss tear/notes 12/28/2010  . Vertigo     Past Surgical History:  Procedure Laterality Date  . ABDOMINAL HYSTERECTOMY  1974   partial  . APPENDECTOMY     as a child/notes 12/28/2010  . BACK SURGERY    . BREAST BIOPSY Left 1999   Archie Endo 12/28/2010  . CATARACT EXTRACTION W/ INTRAOCULAR LENS  IMPLANT, BILATERAL Bilateral 2000s  . ESOPHAGOGASTRODUODENOSCOPY  04/2004   Archie Endo 12/28/2010  . FIXATION KYPHOPLASTY THORACIC SPINE  05/2011   T10/notes 05/25/2011  . FRACTURE SURGERY    . HIP ARTHROPLASTY Right 01/19/2017   Procedure: RIGHT HIP HEMIARTHROPLASTY;  Surgeon:  Renette Butters, MD;  Location: [facility];  Service: Orthopedics;  Laterality: Right;  . LAPAROSCOPIC CHOLECYSTECTOMY  10/2007   Archie Endo 12/14/2010  . LOWER EXTREMITY ANGIOGRAPHY N/A 10/09/2017   Procedure: LOWER EXTREMITY ANGIOGRAPHY;  Surgeon: Lorretta Harp, MD;  Location: [facility];  Service: Cardiovascular;  Laterality: N/A;  bilateral  . LOWER EXTREMITY ANGIOGRAPHY  11/21/2017   Procedure: Lower Extremity Angiography;  Surgeon: Adrian Prows, MD;  Location: [name] [facility];  Service: Cardiovascular;;  . PERIPHERAL VASCULAR BALLOON ANGIOPLASTY  10/09/2017   Attempted PTA Archie Endo 10/09/2017  . PERIPHERAL VASCULAR BALLOON ANGIOPLASTY  10/09/2017   Procedure: PERIPHERAL VASCULAR BALLOON ANGIOPLASTY;  Surgeon: Lorretta Harp, MD;  Location: [facility] [facility];  Service: Cardiovascular;;  Attempted PTA     There were no vitals filed for this visit.  Subjective Assessment - 05/14/18 1049    Subjective  I feel better than last session. My hip gets sore, mostly in mornings. I want my muscles to be worked on today because that makes it feel better for a few days.     Pertinent History  back surgery, Rt THA (posterior), multiple myeloma    Patient Stated Goals  improve strength in her Rt arm and Rt hip  Currently in Pain?  No/denies    Multiple Pain Sites  No                       [facility] Adult PT Treatment/Exercise - 05/14/18 0001      Lumbar Exercises: Aerobic   Nustep  declined today      Moist Heat Therapy   Number Minutes Moist Heat  15 Minutes    Moist Heat Location  Hip      Manual Therapy   Manual therapy comments  soft tissue wotk to RT TFL, RF, around the greater trochanter, anterior hip and glute medius, pt is semi reclined  position    Active TPs throughout musculature.              PT Short Term Goals - 05/07/18 1053      PT SHORT TERM GOAL #1   Title  Pt will demo consistency and independence with HEP to improve ROM and decrease pain.     Time  4    Period  Weeks    Status  Achieved      PT SHORT TERM GOAL #2   Title  Pt will demo improved Rt shoulder flexion active ROM to atleast 120 deg which will assist with her ability to reach over head into her cabinet.    Time  4    Period  Weeks    Status  On-going   113 degrees today     PT SHORT TERM GOAL #3   Title  Pt will report atleast 30% improvement in her pain from the start of PT.    Time  4    Period  Weeks    Status  Achieved        PT Long Term Goals - 05/07/18 1053      PT LONG TERM GOAL #1   Title  Pt will complete 5x sit to stand in less than 14 sec without UE assistance, to reflect increase in LE strength and power.    Time  8    Period  Weeks    Status  Achieved      PT LONG TERM GOAL #4   Title  Pt will report being able to pick up a small pot of tea without the need for caregiver assistance.     Time  8    Period  Weeks    Status  On-going   Reports she hasn't tried but thinks she would have no problem doing this on her on.           Plan - 05/14/18 1053    Clinical Impression Statement  Pt requests only manual work to her hip today as she feels great pain relief for consecutive days. Active trigger points throughout her general hip. Psoas and glute medius were reportedly most tender. Sound like pt is semi-compliant with her home exercises.     Rehab Potential  Good    PT Frequency  2x / week    PT Duration  8 weeks    PT Treatment/Interventions  ADLs/Self Care Home Management;Moist Heat;Electrical Stimulation;Cryotherapy;Therapeutic exercise;Therapeutic activities;Functional mobility training;Balance training;Neuromuscular re-education;Patient/family education;Manual techniques;Passive range of motion;Dry needling;Taping    PT Next Visit Plan  Re-assessement next visit.     PT Home Exercise Plan  [id number]    Consulted and Agree with Plan of Care  Patient       Patient will benefit from skilled therapeutic intervention in order to  improve  the following deficits and impairments:  Abnormal gait, Decreased activity tolerance, Decreased strength, Impaired flexibility, Impaired UE functional use, Postural dysfunction, Pain, Improper body mechanics, Decreased range of motion, Difficulty walking, Decreased endurance, Increased muscle spasms, Hypomobility  Visit Diagnosis: Right shoulder pain, unspecified chronicity  Pain in right hip  Muscle weakness (generalized)  Stiffness of right shoulder, not elsewhere classified     Problem List Patient Active Problem List   Diagnosis Date Noted  . Critical lower limb ischemia 10/04/2017  . Advance care planning   . Goals of care, counseling/discussion   . Palliative care by specialist   . CKD (chronic kidney disease), stage III ([place]) 01/18/2017  . Hip fracture, pathological ([place]) 01/18/2017  . Pathologic hip fracture ([place]) 01/18/2017  . DNR (do not resuscitate) discussion 11/01/2016  . Multiple myeloma ([place]) 10/31/2016  . Acute encephalopathy 10/18/2016  . Hypercalcemia 10/18/2016  . Fall   . Anemia in neoplastic disease 09/23/2016  . Sepsis ([place]) 09/23/2016  . Back pain 09/23/2016  . Intractable back pain 06/20/2016  . Other malaise and fatigue 11/26/2013  . Vertigo   . Obesity   . Arthritis of knee, left 07/09/2013  . Need for prophylactic vaccination and inoculation against influenza 07/09/2013  . Osteoporosis with pathological fracture 01/01/2013  . HLD (hyperlipidemia) 01/01/2013  . HTN (hypertension) 01/01/2013  . Chronic back pain 01/01/2013  . Vertebral compression fracture ([place]) 01/01/2013  . Seasonal allergic rhinitis 01/01/2013    Marguerette Sheller 05/14/2018, 10:56 AM  [facility] [address], [place], [place], [address] Phone: [phone]   Fax:  [phone]  Name: AZLIN ZILBERMAN MRN: [id number] Date of Birth: 1931/08/13

## 2018-05-15 ENCOUNTER — Encounter: Payer: Medicare Other | Admitting: Physical Therapy

## 2018-05-17 ENCOUNTER — Ambulatory Visit: Payer: Medicare Other | Attending: Family Medicine | Admitting: Physical Therapy

## 2018-05-17 ENCOUNTER — Encounter: Payer: Self-pay | Admitting: Physical Therapy

## 2018-05-17 DIAGNOSIS — M25511 Pain in right shoulder: Secondary | ICD-10-CM | POA: Diagnosis not present

## 2018-05-17 DIAGNOSIS — M25611 Stiffness of right shoulder, not elsewhere classified: Secondary | ICD-10-CM | POA: Diagnosis present

## 2018-05-17 DIAGNOSIS — M25551 Pain in right hip: Secondary | ICD-10-CM | POA: Diagnosis present

## 2018-05-17 DIAGNOSIS — M6281 Muscle weakness (generalized): Secondary | ICD-10-CM
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
## 2018-05-22 ENCOUNTER — Ambulatory Visit: Payer: Medicare Other | Admitting: Physical Therapy

## 2018-05-24 ENCOUNTER — Ambulatory Visit: Payer: Medicare Other | Admitting: Physical Therapy

## 2018-05-25 ENCOUNTER — Inpatient Hospital Stay: Payer: Medicare Other | Admitting: Hematology

## 2018-05-25 ENCOUNTER — Inpatient Hospital Stay: Payer: Medicare Other | Attending: Hematology

## 2018-05-25 DIAGNOSIS — Z79899 Other long term (current) drug therapy: Secondary | ICD-10-CM | POA: Insufficient documentation

## 2018-05-25 DIAGNOSIS — Z9221 Personal history of antineoplastic chemotherapy: Secondary | ICD-10-CM | POA: Insufficient documentation

## 2018-05-25 DIAGNOSIS — F329 Major depressive disorder, single episode, unspecified: Secondary | ICD-10-CM | POA: Insufficient documentation

## 2018-05-25 DIAGNOSIS — M25551 Pain in right hip: Secondary | ICD-10-CM | POA: Insufficient documentation

## 2018-05-25 DIAGNOSIS — I129 Hypertensive chronic kidney disease with stage 1 through stage 4 chronic kidney disease, or unspecified chronic kidney disease: Secondary | ICD-10-CM | POA: Insufficient documentation

## 2018-05-25 DIAGNOSIS — D630 Anemia in neoplastic disease: Secondary | ICD-10-CM | POA: Insufficient documentation

## 2018-05-25 DIAGNOSIS — K449 Diaphragmatic hernia without obstruction or gangrene: Secondary | ICD-10-CM | POA: Insufficient documentation

## 2018-05-25 DIAGNOSIS — R944 Abnormal results of kidney function studies: Secondary | ICD-10-CM | POA: Insufficient documentation

## 2018-05-25 DIAGNOSIS — R410 Disorientation, unspecified: Secondary | ICD-10-CM | POA: Insufficient documentation

## 2018-05-25 DIAGNOSIS — M549 Dorsalgia, unspecified: Secondary | ICD-10-CM | POA: Insufficient documentation

## 2018-05-25 DIAGNOSIS — D696 Thrombocytopenia, unspecified: Secondary | ICD-10-CM | POA: Insufficient documentation

## 2018-05-25 DIAGNOSIS — N183 Chronic kidney disease, stage 3 (moderate): Secondary | ICD-10-CM | POA: Insufficient documentation

## 2018-05-25 DIAGNOSIS — C9000 Multiple myeloma not having achieved remission: Secondary | ICD-10-CM | POA: Insufficient documentation

## 2018-05-25 DIAGNOSIS — Z9181 History of falling: Secondary | ICD-10-CM | POA: Insufficient documentation

## 2018-05-25 DIAGNOSIS — I739 Peripheral vascular disease, unspecified: Secondary | ICD-10-CM | POA: Insufficient documentation

## 2018-05-25 DIAGNOSIS — Z7982 Long term (current) use of aspirin: Secondary | ICD-10-CM | POA: Insufficient documentation

## 2018-05-25 DIAGNOSIS — K5900 Constipation, unspecified: Secondary | ICD-10-CM | POA: Insufficient documentation

## 2018-05-27 ENCOUNTER — Other Ambulatory Visit: Payer: Self-pay | Admitting: Nurse Practitioner

## 2018-05-27 DIAGNOSIS — C9000 Multiple myeloma not having achieved remission: Secondary | ICD-10-CM

## 2018-05-29 ENCOUNTER — Other Ambulatory Visit: Payer: Self-pay | Admitting: Nurse Practitioner

## 2018-05-29 DIAGNOSIS — C9000 Multiple myeloma not having achieved remission: Secondary | ICD-10-CM

## 2018-05-29 MED ORDER — MORPHINE SULFATE ER 30 MG PO TBCR: 30.0000 mg | EXTENDED_RELEASE_TABLET | Freq: Two times a day (BID) | ORAL | 0 refills | Status: DC

## 2018-05-29 MED ORDER — OXYCODONE HCL 10 MG PO TABS: 5.0000 mg | ORAL_TABLET | Freq: Four times a day (QID) | ORAL | 0 refills | Status: DC | PRN

## 2018-06-01 ENCOUNTER — Telehealth: Payer: Self-pay

## 2018-06-01 NOTE — Telephone Encounter (Signed)
Per 10/18 patient daughter called to r/s her missed appointment. Per 10/18 phone msg return calls

## 2018-06-04 ENCOUNTER — Telehealth: Payer: Self-pay

## 2018-06-04 ENCOUNTER — Encounter: Payer: Self-pay | Admitting: Hematology

## 2018-06-04 ENCOUNTER — Inpatient Hospital Stay: Payer: Medicare Other

## 2018-06-04 ENCOUNTER — Inpatient Hospital Stay (HOSPITAL_BASED_OUTPATIENT_CLINIC_OR_DEPARTMENT_OTHER): Payer: Medicare Other | Admitting: Hematology

## 2018-06-04 VITALS — BP 155/82 | HR 84 | Temp 97.6°F | Resp 17 | Ht 59.0 in | Wt 141.4 lb

## 2018-06-04 DIAGNOSIS — D630 Anemia in neoplastic disease: Secondary | ICD-10-CM | POA: Diagnosis not present

## 2018-06-04 DIAGNOSIS — R944 Abnormal results of kidney function studies: Secondary | ICD-10-CM | POA: Diagnosis not present

## 2018-06-04 DIAGNOSIS — M8000XS Age-related osteoporosis with current pathological fracture, unspecified site, sequela: Secondary | ICD-10-CM

## 2018-06-04 DIAGNOSIS — I129 Hypertensive chronic kidney disease with stage 1 through stage 4 chronic kidney disease, or unspecified chronic kidney disease: Secondary | ICD-10-CM | POA: Diagnosis not present

## 2018-06-04 DIAGNOSIS — Z7982 Long term (current) use of aspirin: Secondary | ICD-10-CM | POA: Diagnosis not present

## 2018-06-04 DIAGNOSIS — M810 Age-related osteoporosis without current pathological fracture: Secondary | ICD-10-CM | POA: Diagnosis not present

## 2018-06-04 DIAGNOSIS — K5900 Constipation, unspecified: Secondary | ICD-10-CM | POA: Diagnosis not present

## 2018-06-04 DIAGNOSIS — Z8719 Personal history of other diseases of the digestive system: Secondary | ICD-10-CM

## 2018-06-04 DIAGNOSIS — C9000 Multiple myeloma not having achieved remission: Secondary | ICD-10-CM

## 2018-06-04 DIAGNOSIS — M25551 Pain in right hip: Secondary | ICD-10-CM | POA: Diagnosis not present

## 2018-06-04 DIAGNOSIS — F329 Major depressive disorder, single episode, unspecified: Secondary | ICD-10-CM

## 2018-06-04 DIAGNOSIS — I10 Essential (primary) hypertension: Secondary | ICD-10-CM

## 2018-06-04 DIAGNOSIS — Z9221 Personal history of antineoplastic chemotherapy: Secondary | ICD-10-CM

## 2018-06-04 DIAGNOSIS — Z7901 Long term (current) use of anticoagulants: Secondary | ICD-10-CM

## 2018-06-04 DIAGNOSIS — K449 Diaphragmatic hernia without obstruction or gangrene: Secondary | ICD-10-CM

## 2018-06-04 DIAGNOSIS — Z79899 Other long term (current) drug therapy: Secondary | ICD-10-CM | POA: Diagnosis not present

## 2018-06-04 DIAGNOSIS — N183 Chronic kidney disease, stage 3 (moderate): Secondary | ICD-10-CM | POA: Diagnosis not present

## 2018-06-04 DIAGNOSIS — M549 Dorsalgia, unspecified: Secondary | ICD-10-CM

## 2018-06-04 DIAGNOSIS — D696 Thrombocytopenia, unspecified: Secondary | ICD-10-CM | POA: Diagnosis not present

## 2018-06-04 DIAGNOSIS — I739 Peripheral vascular disease, unspecified: Secondary | ICD-10-CM

## 2018-06-04 DIAGNOSIS — M7989 Other specified soft tissue disorders: Secondary | ICD-10-CM

## 2018-06-04 DIAGNOSIS — R410 Disorientation, unspecified: Secondary | ICD-10-CM | POA: Diagnosis not present

## 2018-06-04 DIAGNOSIS — Z9181 History of falling: Secondary | ICD-10-CM | POA: Diagnosis not present

## 2018-06-04 DIAGNOSIS — E669 Obesity, unspecified: Secondary | ICD-10-CM

## 2018-06-04 DIAGNOSIS — Z8781 Personal history of (healed) traumatic fracture: Secondary | ICD-10-CM

## 2018-06-04 LAB — CBC WITH DIFFERENTIAL/PLATELET
ABS IMMATURE GRANULOCYTES: 0.04 10*3/uL (ref 0.00–0.07)
BASOS ABS: 0.1 10*3/uL (ref 0.0–0.1)
BASOS PCT: 1 %
EOS PCT: 2 %
Eosinophils Absolute: 0.2 10*3/uL (ref 0.0–0.5)
HCT: 34.6 % — ABNORMAL LOW (ref 36.0–46.0)
Hemoglobin: 11.6 g/dL — ABNORMAL LOW (ref 12.0–15.0)
Immature Granulocytes: 1 %
Lymphocytes Relative: 26 %
Lymphs Abs: 1.8 10*3/uL (ref 0.7–4.0)
MCH: 32.8 pg (ref 26.0–34.0)
MCHC: 33.5 g/dL (ref 30.0–36.0)
MCV: 97.7 fL (ref 80.0–100.0)
MONO ABS: 0.6 10*3/uL (ref 0.1–1.0)
Monocytes Relative: 9 %
NEUTROS ABS: 4.2 10*3/uL (ref 1.7–7.7)
NRBC: 0 % (ref 0.0–0.2)
Neutrophils Relative %: 61 %
PLATELETS: 225 10*3/uL (ref 150–400)
RBC: 3.54 MIL/uL — AB (ref 3.87–5.11)
RDW: 15.3 % (ref 11.5–15.5)
WBC: 6.9 10*3/uL (ref 4.0–10.5)

## 2018-06-04 LAB — COMPREHENSIVE METABOLIC PANEL
ALT: 37 U/L (ref 0–44)
AST: 34 U/L (ref 15–41)
Albumin: 3.7 g/dL (ref 3.5–5.0)
Alkaline Phosphatase: 116 U/L (ref 38–126)
Anion gap: 10 (ref 5–15)
BILIRUBIN TOTAL: 0.5 mg/dL (ref 0.3–1.2)
BUN: 27 mg/dL — AB (ref 8–23)
CO2: 25 mmol/L (ref 22–32)
Calcium: 9.6 mg/dL (ref 8.9–10.3)
Chloride: 107 mmol/L (ref 98–111)
Creatinine, Ser: 1.48 mg/dL — ABNORMAL HIGH (ref 0.44–1.00)
GFR, EST AFRICAN AMERICAN: 36 mL/min — AB (ref >60)
GFR, EST NON AFRICAN AMERICAN: 31 mL/min — AB (ref >60)
Glucose, Bld: 140 mg/dL — ABNORMAL HIGH (ref 70–99)
Potassium: 4.1 mmol/L (ref 3.5–5.1)
Sodium: 142 mmol/L (ref 135–145)
TOTAL PROTEIN: 7.6 g/dL (ref 6.5–8.1)

## 2018-06-04 MED ORDER — LENALIDOMIDE 5 MG PO CAPS: 5.0000 mg | ORAL_CAPSULE | Freq: Every day | ORAL | 2 refills | Status: DC
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
## 2018-06-04 NOTE — Telephone Encounter (Signed)
Printed avs and calender of upcoming appointment. Per 10/21 los

## 2018-06-05 LAB — KAPPA/LAMBDA LIGHT CHAINS
KAPPA FREE LGHT CHN: 42.8 mg/L — AB (ref 3.3–19.4)
Kappa, lambda light chain ratio: 1.07 (ref 0.26–1.65)
LAMDA FREE LIGHT CHAINS: 40.1 mg/L — AB (ref 5.7–26.3)

## 2018-06-05 LAB — MULTIPLE MYELOMA PANEL, SERUM
Albumin SerPl Elph-Mcnc: 3.8 g/dL (ref 2.9–4.4)
Albumin/Glob SerPl: 1.1 (ref 0.7–1.7)
Alpha 1: 0.2 g/dL (ref 0.0–0.4)
Alpha2 Glob SerPl Elph-Mcnc: 0.7 g/dL (ref 0.4–1.0)
B-GLOBULIN SERPL ELPH-MCNC: 1 g/dL (ref 0.7–1.3)
GAMMA GLOB SERPL ELPH-MCNC: 1.6 g/dL (ref 0.4–1.8)
GLOBULIN, TOTAL: 3.6 g/dL (ref 2.2–3.9)
IgA: 518 mg/dL — ABNORMAL HIGH (ref 64–422)
IgG (Immunoglobin G), Serum: 1565 mg/dL (ref 700–1600)
IgM (Immunoglobulin M), Srm: 23 mg/dL — ABNORMAL LOW (ref 26–217)
Total Protein ELP: 7.4 g/dL (ref 6.0–8.5)

## 2018-06-24 ENCOUNTER — Other Ambulatory Visit: Payer: Self-pay | Admitting: Nurse Practitioner

## 2018-06-24 DIAGNOSIS — C9000 Multiple myeloma not having achieved remission: Secondary | ICD-10-CM

## 2018-06-26 ENCOUNTER — Other Ambulatory Visit: Payer: Self-pay | Admitting: Nurse Practitioner

## 2018-06-26 DIAGNOSIS — C9000 Multiple myeloma not having achieved remission: Secondary | ICD-10-CM

## 2018-06-26 MED ORDER — OXYCODONE HCL 10 MG PO TABS: 5.0000 mg | ORAL_TABLET | Freq: Four times a day (QID) | ORAL | 0 refills | Status: DC | PRN

## 2018-06-26 MED ORDER — MORPHINE SULFATE ER 30 MG PO TBCR: 30.0000 mg | EXTENDED_RELEASE_TABLET | Freq: Two times a day (BID) | ORAL | 0 refills | Status: DC

## 2018-06-27 ENCOUNTER — Other Ambulatory Visit: Payer: Self-pay | Admitting: *Deleted

## 2018-06-27 DIAGNOSIS — C9000 Multiple myeloma not having achieved remission: Secondary | ICD-10-CM

## 2018-06-27 MED ORDER — LENALIDOMIDE 5 MG PO CAPS: 5.0000 mg | ORAL_CAPSULE | Freq: Every day | ORAL | 2 refills | Status: DC

## 2018-07-13 ENCOUNTER — Telehealth: Payer: Self-pay

## 2018-07-13 NOTE — Telephone Encounter (Signed)
On 06/27/18 script for Revlimid and [employer] authorization was faxed to [facility], sent for scan to chart.

## 2018-07-17 ENCOUNTER — Encounter (HOSPITAL_BASED_OUTPATIENT_CLINIC_OR_DEPARTMENT_OTHER): Payer: Medicare Other | Attending: Internal Medicine

## 2018-07-17 DIAGNOSIS — Z86718 Personal history of other venous thrombosis and embolism: Secondary | ICD-10-CM | POA: Diagnosis not present

## 2018-07-17 DIAGNOSIS — N183 Chronic kidney disease, stage 3 (moderate): Secondary | ICD-10-CM | POA: Insufficient documentation

## 2018-07-17 DIAGNOSIS — Z872 Personal history of diseases of the skin and subcutaneous tissue: Secondary | ICD-10-CM | POA: Insufficient documentation

## 2018-07-17 DIAGNOSIS — Z8579 Personal history of other malignant neoplasms of lymphoid, hematopoietic and related tissues: Secondary | ICD-10-CM | POA: Insufficient documentation

## 2018-07-17 DIAGNOSIS — R600 Localized edema: Secondary | ICD-10-CM | POA: Diagnosis not present

## 2018-07-17 NOTE — Progress Notes (Signed)
[place]   Telephone:([phone] Fax:(336) [phone]   Clinic Follow up Note   Patient Care Team: Kristin Shanks, MD as PCP - General (Family Medicine) 07/18/2018  CHIEF COMPLAINT: F/u on MM   SUMMARY OF ONCOLOGIC HISTORY:   Multiple myeloma ([place])   10/18/2016 - 10/26/2016 Hospital Admission    Pt was admitted for symptomatic hypercalcemia, AKI, bone marrow biopsy done during hospital stay, and she received Aredia     10/19/2016 Tumor Marker    SPEP showed M-protein 0.4g/dl, serum lambda light chain 5362, ratio 0.00    10/25/2016 Bone Marrow Biopsy     Overall, the marrow is hypercellular with increased lambda-restricted plasma cells (62% aspirate, 90% CD138). There is partial expression of CD20, but no monoclonal B-cell population is detected by flow cytometry.     10/25/2016 Initial Diagnosis    Multiple myeloma ([place])    10/25/2016 Miscellaneous    Cytogenetics and FISH showed loss of 13q, which is intermediate risk for MM     11/10/2016 -  Chemotherapy    Revlimid 10 mg (increased to 55m on cycle 2) daily, 3 weeks on, and one week off, dexamethasone 20 mg weekly, was off from early June to mid July 2018 due to her right hip fracture and hospitalization.  Changed to maintenence Revlimid 145mand stopped dexamethasone starting next cycle, 06/10/17       01/02/2017 Imaging     DG Hip Unilat w or w/o Pelvis IMPRESSION 1. Degenerative changes lumbar spine and both hips. No evidence of fracture or dislocation.  2. Diffuse osteopenia. Multiple tiny lucencies are noted throughout both proximal femurs. These may be secondary to the patient's known multiple myeloma.    01/18/2017 Imaging     CT hip right wo contrast  IMPRESSION: Findings consistent with a pathologic fracture of the right femoral neck. A large lytic lesion in the superior aspect of the lateral femoral head is identified.  Possible nondisplaced fracture of the right inferior pubic  ramus.  Diffuse myeloma.    01/18/2017 - 01/23/2017 Hospital Admission    She present to the ED with complaints of severe right hip pain. she recieved a right hip hemiarthroplasty with Dr. MuPercell Miller   CURRENT THERAPY maintenance Revlimid 1080maily for 3 weeks on and 1 week off   INTERVAL HISTORY: Kristin [name] a 84 54o. female who is here for follow-up. Today, she is here with her family member. She was eating during the visit. She is doing well, but reports left sided back/flank pain around the rib cage. The pain is intermittent, and worse with walking and activity. It also happens randomly in the morning or at night. She has good ROM of shoulders and arm. She rates the pain 6/10 and says. She takes OxyContin 3 times a day and is able to maintain daily activities. She had right hip surgery, and denies left hip pain. She has intermittent back pain. She denies injury or trauma to her left back/flank. She denies recent infections or fever. No dysuria. She denies jaw pain.    Pertinent positives and negatives of review of systems are listed and detailed within the above HPI.  REVIEW OF SYSTEMS:    Constitutional: Denies fevers, chills or abnormal weight loss Eyes: Denies blurriness of vision Ears, nose, mouth, throat, and face: Denies mucositis or sore throat Respiratory: Denies cough, dyspnea or wheezes Cardiovascular: Denies palpitation, chest discomfort or lower extremity swelling Gastrointestinal:  Denies nausea, heartburn or change  in bowel habits Skin: Denies abnormal skin rashes Lymphatics: Denies new lymphadenopathy or easy bruising Neurological:Denies numbness, tingling or new weaknesses MSK: (+) intermittent left sided back/flank pain  Behavioral/Psych: Mood is stable, no new changes  All other systems were reviewed with the patient and are negative.  MEDICAL HISTORY:  Past Medical History:  Diagnosis Date  . Bone cancer [facility])    "being tx'd by Dr. Burr Medico @ [facility]"  (10/09/2017)  . Chronic kidney disease (CKD), stage III (moderate) (HCC)   . Depression    Risperdal "twice/day" (09/23/2016)  . Glaucoma   . Hiatal hernia   . Hypertension   . Intractable back pain    /notes 09/23/2016  . Obesity   . Osteoarthritis    "all over" (10/09/2017)  . Osteoporosis    vertebral fracture in 2012  . Peripheral vascular disease ([place])   . Upper GI bleed 04/2004   secondary to Mallory-Weiss tear/notes 12/28/2010  . Vertigo     SURGICAL HISTORY: Past Surgical History:  Procedure Laterality Date  . ABDOMINAL HYSTERECTOMY  1974   partial  . APPENDECTOMY     as a child/notes 12/28/2010  . BACK SURGERY    . BREAST BIOPSY Left 1999   Archie Endo 12/28/2010  . CATARACT EXTRACTION W/ INTRAOCULAR LENS  IMPLANT, BILATERAL Bilateral 2000s  . ESOPHAGOGASTRODUODENOSCOPY  04/2004   Archie Endo 12/28/2010  . FIXATION KYPHOPLASTY THORACIC SPINE  05/2011   T10/notes 05/25/2011  . FRACTURE SURGERY    . HIP ARTHROPLASTY Right 01/19/2017   Procedure: RIGHT HIP HEMIARTHROPLASTY;  Surgeon: Renette Butters, MD;  Location: [place];  Service: Orthopedics;  Laterality: Right;  . LAPAROSCOPIC CHOLECYSTECTOMY  10/2007   Archie Endo 12/14/2010  . LOWER EXTREMITY ANGIOGRAPHY N/A 10/09/2017   Procedure: LOWER EXTREMITY ANGIOGRAPHY;  Surgeon: Lorretta Harp, MD;  Location: [place] [facility];  Service: Cardiovascular;  Laterality: N/A;  bilateral  . LOWER EXTREMITY ANGIOGRAPHY  11/21/2017   Procedure: Lower Extremity Angiography;  Surgeon: Adrian Prows, MD;  Location: [place] [facility];  Service: Cardiovascular;;  . PERIPHERAL VASCULAR BALLOON ANGIOPLASTY  10/09/2017   Attempted PTA Archie Endo 10/09/2017  . PERIPHERAL VASCULAR BALLOON ANGIOPLASTY  10/09/2017   Procedure: PERIPHERAL VASCULAR BALLOON ANGIOPLASTY;  Surgeon: Lorretta Harp, MD;  Location: [place] [facility];  Service: Cardiovascular;;  Attempted PTA     I have reviewed the social history and family history with the patient and they are unchanged  from previous note.  ALLERGIES:  is allergic to penicillins; tramadol; alendronate; and sulfonamide derivatives.  MEDICATIONS:  Current Outpatient Medications  Medication Sig Dispense Refill  . amlodipine-atorvastatin (CADUET) 2.5-10 MG tablet Take 1 tablet by mouth daily.    Marland Kitchen aspirin 81 MG chewable tablet Chew 81 mg by mouth daily.    Marland Kitchen atorvastatin (LIPITOR) 10 MG tablet Take 10 mg by mouth daily.    . Calcium Carbonate (CALCIUM 600 PO) Take 600 mg by mouth daily.     . clindamycin (CLEOCIN) 300 MG capsule Take 1 capsule (300 mg total) by mouth 3 (three) times daily. 21 capsule 0  . clopidogrel (PLAVIX) 75 MG tablet Take 75 mg by mouth daily.    . feeding supplement, ENSURE ENLIVE, (ENSURE ENLIVE) LIQD Take 237 mLs by mouth 2 (two) times daily between meals. (Patient taking differently: Take 237 mLs by mouth daily. ) 237 mL 12  . furosemide (LASIX) 20 MG tablet Take 20 mg by mouth daily.    . irbesartan (AVAPRO) 75 MG tablet Take 75  mg by mouth daily after supper.    Marland Kitchen lenalidomide (REVLIMID) 5 MG capsule Take 1 capsule (5 mg total) by mouth daily. ADULT Female:  Authorization #[id number]  06/27/18-Faxed to [employer] @ [phone] 21 capsule 2  . levofloxacin (LEVAQUIN) 500 MG tablet Take 1 tablet (500 mg total) by mouth daily. 7 tablet 0  . magnesium oxide (MAG-OX) 400 (241.3 Mg) MG tablet Take 1 tablet (400 mg total) by mouth daily. 7 tablet 0  . megestrol (MEGACE) 40 MG tablet Take 1 tablet (40 mg total) by mouth daily.    Marland Kitchen morphine ([name]) 30 MG 12 hr tablet Take 1 tablet (30 mg total) by mouth every 12 (twelve) hours. 60 tablet 0  . ondansetron (ZOFRAN ODT) 4 MG disintegrating tablet Take 1-2 tablets (4-8 mg total) by mouth every 8 (eight) hours as needed for nausea or vomiting. (Patient taking differently: Take 4 mg by mouth daily. ) 30 tablet 2  . Oxycodone HCl 10 MG TABS Take 0.5-1 tablets (5-10 mg total) by mouth every 6 (six) hours as needed. FOR MODERATE OR SEVERE PAIN 90  tablet 0  . RESTASIS 0.05 % ophthalmic emulsion Place 1 drop into both eyes daily.     . risperiDONE (RISPERDAL) 0.5 MG tablet Take 0.5 mg by mouth daily.     Marland Kitchen SANTYL ointment Apply 1 application topically every other day.  0  . sennosides-docusate sodium (SENOKOT-S) 8.6-50 MG tablet Take 1 tablet by mouth 2 (two) times daily.     Marland Kitchen triamcinolone (KENALOG) 0.025 % cream APPLY TO AFFECTED AREA TWICE A DAY FOR 10 DAYS  1   No current facility-administered medications for this visit.    Facility-Administered Medications Ordered in Other Visits  Medication Dose Route Frequency Provider Last Rate Last Dose  . denosumab (XGEVA) injection 120 mg  120 mg Subcutaneous Once Truitt Merle, MD        PHYSICAL EXAMINATION: ECOG PERFORMANCE STATUS: 3 - Symptomatic, >50% confined to bed  Vitals:   07/18/18 1430  BP: 129/65  Pulse: 67  Resp: 18  Temp: 98.9 F (37.2 C)  SpO2: 99%   Filed Weights   07/18/18 1430  Weight: 138 lb 6.4 oz (62.8 kg)    GENERAL:alert, no distress and comfortable (+) on wheelchair SKIN: skin color, texture, turgor are normal, no rashes or significant lesions EYES: normal, Conjunctiva are pink and non-injected, sclera clear OROPHARYNX:no exudate, no erythema and lips, buccal mucosa, and tongue normal  NECK: supple, thyroid normal size, non-tender, without nodularity LYMPH:  no palpable lymphadenopathy in the cervical, axillary or inguinal LUNGS: clear to auscultation and percussion with normal breathing effort HEART: regular rate & rhythm and no murmurs and no lower extremity edema ABDOMEN:abdomen soft, non-tender and normal bowel sounds Musculoskeletal:no cyanosis of digits and no clubbing (+) left coastal lateral margin tenderness NEURO: alert & oriented x 3 with fluent speech, no focal motor/sensory deficits  LABORATORY DATA:  I have reviewed the data as listed CBC Latest Ref Rng & Units 07/18/2018 06/04/2018 04/24/2018  WBC 4.0 - 10.5 K/uL 6.4 6.9 3.6(L)    Hemoglobin 12.0 - 15.0 g/dL 11.1(L) 11.6(L) 10.3(L)  Hematocrit 36.0 - 46.0 % 32.9(L) 34.6(L) 31.0(L)  Platelets 150 - 400 K/uL 193 225 231     CMP Latest Ref Rng & Units 07/18/2018 06/04/2018 04/24/2018  Glucose 70 - 99 mg/dL 133(H) 140(H) 123(H)  BUN 8 - 23 mg/dL 15 27(H) 20  Creatinine 0.44 - 1.00 mg/dL 1.35(H) 1.48(H) 1.41(H)  Sodium 135 -  145 mmol/L 140 142 140  Potassium 3.5 - 5.1 mmol/L 3.7 4.1 4.1  Chloride 98 - 111 mmol/L 106 107 105  CO2 22 - 32 mmol/L 28 25 26   Calcium 8.9 - 10.3 mg/dL 8.9 9.6 9.1  Total Protein 6.5 - 8.1 g/dL 6.8 7.6 6.7  Total Bilirubin 0.3 - 1.2 mg/dL 0.5 0.5 0.5  Alkaline Phos 38 - 126 U/L 127(H) 116 67  AST 15 - 41 U/L 23 34 15  ALT 0 - 44 U/L 24 37 7   MM LAB:  M-protein (g/dl) 10/19/2016: 0.4 11/01/2016: Not Det 12/05/16: Not Det 01/02/17: Not det 02/27/17: Not det 03/24/17: not det.  04/24/17: not det.  05/19/17: not det. 06/16/17:: not det 07/21/17: not det 08/18/17: not det 09/15/17: not det 10/27/17: Not det 11/24/17: not det 12/22/17: not det 01/30/18: not det 02/27/18: not det 03/27/18: Not det 04/24/18: not det.  06/04/18: Not Observed  Serum kappa, lamda light chains and ratio (mg/L) 10/21/2016: 14.8, 5362, 0.00 11/01/2016: 1.49, 10.88, 0.14 12/05/2016: 2.16, 19.31, 0.11 01/02/17: 3.01, 10.90, 0.28 02/27/17: 1.49, 20.64, 0.07 03/24/17: 2.19, 3.90, 0.56 04/24/17: 2.58, 4.02, 0.64 05/19/17: 2.22, 3.55, 0.63 06/16/17:: 4.18, 5.55, 0.75 07/21/17: 3.86, 5.10, 0.76 08/18/17: 2.90, 3.58, 0.81 09/15/17: 5.53. 5.47, 1.01 10/27/17: 7.86, 8.58, 0.92 11/24/17: 8.86, 8.16, 1.09 12/22/17: 7.79, 6.77, 1.15 01/30/18: 5.79, 4.70, 1.23 02/27/18: 5.27, 4.30, 1.23 03/27/18: 5.98, 5.97, 1 04/24/18: 4.63, 4.12, 1.12 06/04/18: 4.28, 4.01, 1.07   24 hour urine (total protein, free lambda, ration) 10/21/2016: 1463, 1570, 0.06   RADIOGRAPHIC STUDIES: I have personally reviewed the radiological images as listed and agreed with the findings in the report. Dg Ribs  Unilateral Left  Result Date: 07/18/2018 CLINICAL DATA:  84 year old female with left axillary rib region pain for 1 week with no known injury. Multiple myeloma. EXAM: LEFT RIBS - 2 VIEW COMPARISON:  Chest CT 10/19/2016. FINDINGS: Chronic destruction of the head of the left clavicle (arrow). A mild pathologic fracture of the posterior left 7th rib also noted in 2018 and remains evident. Osteopenia. Previous T9 vertebral augmentation. The marked area of clinical concern is at the anterior left lower rib level, approximately the anterior 9th rib. No rib abnormality is evident in that region. Chronically low lung volumes. Stable cardiomegaly and mediastinal contours. Negative visible bowel gas pattern. Stable cholecystectomy clips. IMPRESSION: 1. No acute rib lesion is evident radiographically in the area of clinical concern. Sequelae of multiple myeloma with chronically abnormal posterior left 7th rib and left clavicle again noted. 2. Chronic low lung volumes with cardiomegaly. Electronically Signed   By: Genevie Ann M.D.   On: 07/18/2018 15:46     ASSESSMENT & PLAN:  ASPYN WARNKE is a 84 y.o. female with history of  1. Multiple Myeloma, lambda light chain disease, staging III, intermediate risk (13q-) -She was diagnosed in March 2018, initially treated with Revlimid and dexamethasone for 6 month with good response, changed to maintenance Revlimid 5 mg once daily 3 weeks on 1 week off.  -Her M protein has been nondetectable -She will repeat MM panel every 8 weeks -Labs reviewed, CBC showed Hg 11.1. CMP and MM panel pending. -She recently experienced left lateral costal margin pain that is intermittent and 6/10 in severity. I will oder X ray to r/u fractures -Delton See has been held due to concern of jaw necrosis -Continue Revlimid  2. Anemia  -Due to MM and Revlimid. -Will consider blood transfusion if Hb <8 or symptomatic anemia with Hg 8-9 -Labs reviewed,  Hg 11.1.   3.  Hypercalcemia/hypocalcemia -resolved  -today's CMP pending  4. Chronic CKD, stage II-III -Avoid nephrotoxic drugs and contrasts. -Labs reviewed, Hg 11.1. CMP and MM panel pending.   5. Severe back pain and right hip pain from pathologic fracture -Compression fracture at T2.  -Pain controlled with Morphine and oxycodone. Overall mild now   6. HTN -f/u with PCP and continue medications -stable and controlled   7. Osteoporosis -I previously encouraged her to continue calcium supplements  8. Goal of care discussion  -The patient understands the goal of care is palliative. -I previously recommended DNR/DNI, she will think about it   9. Cellulitis vs osteonecrosis of the jaw -I stopped Xgeva -She will continue mouthwash as per Dr. Sabra Heck -She denies new jaw pain  10. Left sided back/flank pain along left costal margin -intermitted and 6/10 in severity. Starts randomly and resolves spontaneously  -will order an xray to r/u fracture    Plan  -refilled morphine and oxycodone today -X ray of left ribs today -f/u in 6 weeks with labs   No problem-specific Assessment & Plan notes found for this encounter.   Orders Placed This Encounter  Procedures  . DG Ribs Unilateral Left    Standing Status:   Future    Number of Occurrences:   1    Standing Expiration Date:   09/19/2019    Order Specific Question:   Reason for Exam (SYMPTOM  OR DIAGNOSIS REQUIRED)    Answer:   rule out fracture, left rib cage pain    Order Specific Question:   Preferred imaging location?    Answer:   [facility]    Order Specific Question:   Radiology Contrast Protocol - do NOT remove file path    Answer:   [email or web address]   All questions were answered. The patient knows to call the clinic with any problems, questions or concerns. No barriers to learning was detected. I spent 20 minutes counseling the patient face to face. The total time spent in the  appointment was 25 minutes and more than 50% was on counseling and review of test results  I, Noor Dweik am acting as scribe for Dr. Truitt Merle.  I have reviewed the above documentation for accuracy and completeness, and I agree with the above.     Truitt Merle, MD 07/18/2018

## 2018-07-18 ENCOUNTER — Inpatient Hospital Stay: Payer: Medicare Other | Attending: Hematology

## 2018-07-18 ENCOUNTER — Telehealth: Payer: Self-pay | Admitting: Hematology

## 2018-07-18 ENCOUNTER — Ambulatory Visit (HOSPITAL_COMMUNITY)
Admission: RE | Admit: 2018-07-18 | Discharge: 2018-07-18 | Disposition: A | Discharge status: Home / Self Care | Payer: Medicare Other | Source: Ambulatory Visit | Attending: Hematology | Admitting: Hematology

## 2018-07-18 ENCOUNTER — Inpatient Hospital Stay (HOSPITAL_BASED_OUTPATIENT_CLINIC_OR_DEPARTMENT_OTHER): Payer: Medicare Other | Admitting: Hematology

## 2018-07-18 ENCOUNTER — Encounter: Payer: Self-pay | Admitting: Hematology

## 2018-07-18 DIAGNOSIS — C9000 Multiple myeloma not having achieved remission: Secondary | ICD-10-CM | POA: Diagnosis present

## 2018-07-18 DIAGNOSIS — R109 Unspecified abdominal pain: Secondary | ICD-10-CM | POA: Diagnosis not present

## 2018-07-18 DIAGNOSIS — N183 Chronic kidney disease, stage 3 (moderate): Secondary | ICD-10-CM | POA: Insufficient documentation

## 2018-07-18 DIAGNOSIS — D649 Anemia, unspecified: Secondary | ICD-10-CM | POA: Insufficient documentation

## 2018-07-18 DIAGNOSIS — M549 Dorsalgia, unspecified: Secondary | ICD-10-CM

## 2018-07-18 DIAGNOSIS — Z7982 Long term (current) use of aspirin: Secondary | ICD-10-CM | POA: Diagnosis not present

## 2018-07-18 DIAGNOSIS — I10 Essential (primary) hypertension: Secondary | ICD-10-CM | POA: Insufficient documentation

## 2018-07-18 DIAGNOSIS — Z90710 Acquired absence of both cervix and uterus: Secondary | ICD-10-CM

## 2018-07-18 DIAGNOSIS — M25551 Pain in right hip: Secondary | ICD-10-CM

## 2018-07-18 DIAGNOSIS — Z79899 Other long term (current) drug therapy: Secondary | ICD-10-CM | POA: Insufficient documentation

## 2018-07-18 DIAGNOSIS — Z9221 Personal history of antineoplastic chemotherapy: Secondary | ICD-10-CM

## 2018-07-18 LAB — COMPREHENSIVE METABOLIC PANEL
ALBUMIN: 3.2 g/dL — AB (ref 3.5–5.0)
ALT: 24 U/L (ref 0–44)
AST: 23 U/L (ref 15–41)
Alkaline Phosphatase: 127 U/L — ABNORMAL HIGH (ref 38–126)
Anion gap: 6 (ref 5–15)
BILIRUBIN TOTAL: 0.5 mg/dL (ref 0.3–1.2)
BUN: 15 mg/dL (ref 8–23)
CHLORIDE: 106 mmol/L (ref 98–111)
CO2: 28 mmol/L (ref 22–32)
Calcium: 8.9 mg/dL (ref 8.9–10.3)
Creatinine, Ser: 1.35 mg/dL — ABNORMAL HIGH (ref 0.44–1.00)
GFR calc Af Amer: 41 mL/min — ABNORMAL LOW (ref >60)
GFR calc non Af Amer: 36 mL/min — ABNORMAL LOW (ref >60)
Glucose, Bld: 133 mg/dL — ABNORMAL HIGH (ref 70–99)
POTASSIUM: 3.7 mmol/L (ref 3.5–5.1)
Sodium: 140 mmol/L (ref 135–145)
Total Protein: 6.8 g/dL (ref 6.5–8.1)

## 2018-07-18 LAB — CBC WITH DIFFERENTIAL/PLATELET
Abs Immature Granulocytes: 0.02 10*3/uL (ref 0.00–0.07)
Basophils Absolute: 0 10*3/uL (ref 0.0–0.1)
Basophils Relative: 1 %
EOS ABS: 0.4 10*3/uL (ref 0.0–0.5)
Eosinophils Relative: 6 %
HEMATOCRIT: 32.9 % — AB (ref 36.0–46.0)
Hemoglobin: 11.1 g/dL — ABNORMAL LOW (ref 12.0–15.0)
Immature Granulocytes: 0 %
LYMPHS ABS: 1.9 10*3/uL (ref 0.7–4.0)
LYMPHS PCT: 30 %
MCH: 31.9 pg (ref 26.0–34.0)
MCHC: 33.7 g/dL (ref 30.0–36.0)
MCV: 94.5 fL (ref 80.0–100.0)
Monocytes Absolute: 0.7 10*3/uL (ref 0.1–1.0)
Monocytes Relative: 11 %
Neutro Abs: 3.4 10*3/uL (ref 1.7–7.7)
Neutrophils Relative %: 52 %
PLATELETS: 193 10*3/uL (ref 150–400)
RBC: 3.48 MIL/uL — ABNORMAL LOW (ref 3.87–5.11)
RDW: 14.1 % (ref 11.5–15.5)
WBC: 6.4 10*3/uL (ref 4.0–10.5)
nRBC: 0 % (ref 0.0–0.2)

## 2018-07-18 MED ORDER — MORPHINE SULFATE ER 30 MG PO TBCR: 30.0000 mg | EXTENDED_RELEASE_TABLET | Freq: Two times a day (BID) | ORAL | 0 refills | Status: DC

## 2018-07-18 MED ORDER — OXYCODONE HCL 10 MG PO TABS: 5.0000 mg | ORAL_TABLET | Freq: Four times a day (QID) | ORAL | 0 refills | Status: DC | PRN

## 2018-07-18 NOTE — Telephone Encounter (Signed)
Scheduled appt per 12/4 los - gave patient AVS and calender per los.

## 2018-07-19 ENCOUNTER — Encounter: Payer: Self-pay | Admitting: Hematology

## 2018-07-19 LAB — KAPPA/LAMBDA LIGHT CHAINS
KAPPA FREE LGHT CHN: 59.7 mg/L — AB (ref 3.3–19.4)
Kappa, lambda light chain ratio: 1.07 (ref 0.26–1.65)
LAMDA FREE LIGHT CHAINS: 55.6 mg/L — AB (ref 5.7–26.3)

## 2018-07-20 ENCOUNTER — Telehealth: Payer: Self-pay

## 2018-07-20 NOTE — Telephone Encounter (Signed)
-----   Message from Truitt Merle, MD sent at 07/19/2018 10:50 PM EST ----- Please let pt or her daughter know the x-ray results, thanks   Truitt Merle  07/19/2018

## 2018-07-20 NOTE — Telephone Encounter (Signed)
Spoke with patient's daughter Hassan Rowan regarding x ray results, per Dr. Burr Medico x ray was negative for fracture or lesions, she verbalized an understanding.

## 2018-07-21 LAB — MULTIPLE MYELOMA PANEL, SERUM
ALBUMIN SERPL ELPH-MCNC: 3.4 g/dL (ref 2.9–4.4)
ALPHA 1: 0.2 g/dL (ref 0.0–0.4)
Albumin/Glob SerPl: 1.2 (ref 0.7–1.7)
Alpha2 Glob SerPl Elph-Mcnc: 0.7 g/dL (ref 0.4–1.0)
B-Globulin SerPl Elph-Mcnc: 0.9 g/dL (ref 0.7–1.3)
GLOBULIN, TOTAL: 3 g/dL (ref 2.2–3.9)
Gamma Glob SerPl Elph-Mcnc: 1.2 g/dL (ref 0.4–1.8)
IGA: 491 mg/dL — AB (ref 64–422)
IGG (IMMUNOGLOBIN G), SERUM: 1367 mg/dL (ref 700–1600)
IGM (IMMUNOGLOBULIN M), SRM: 30 mg/dL (ref 26–217)
TOTAL PROTEIN ELP: 6.4 g/dL (ref 6.0–8.5)

## 2018-07-25 ENCOUNTER — Telehealth: Payer: Self-pay

## 2018-07-25 NOTE — Telephone Encounter (Signed)
Faxed refill on Revlimid to [facility] sent to HIM for scanning to chart.

## 2018-08-17 ENCOUNTER — Other Ambulatory Visit: Payer: Self-pay

## 2018-08-17 DIAGNOSIS — C9000 Multiple myeloma not having achieved remission: Secondary | ICD-10-CM

## 2018-08-17 MED ORDER — LENALIDOMIDE 5 MG PO CAPS: 5.0000 mg | ORAL_CAPSULE | Freq: Every day | ORAL | 0 refills | Status: DC

## 2018-08-17 MED ORDER — LENALIDOMIDE 5 MG PO CAPS: 5.0000 mg | ORAL_CAPSULE | Freq: Every day | ORAL | 2 refills | Status: DC

## 2018-08-22 ENCOUNTER — Telehealth: Payer: Self-pay

## 2018-08-22 NOTE — Telephone Encounter (Signed)
Faxed signed script and [employer] authorization to [employer], sent to [facility] for scan.

## 2018-08-27 NOTE — Progress Notes (Addendum)
[place]   [phone]) [phone] [phone]) [phone]   Clinic Follow up Note   Patient Care Team: [name], MD as PCP - General (Family Medicine)  Date of Service:  08/29/2018  CHIEF COMPLAINT: F/u of MM  SUMMARY OF ONCOLOGIC HISTORY:   Multiple myeloma ([place])   10/18/2016 - 10/26/2016 Hospital Admission    Pt was admitted for symptomatic hypercalcemia, AKI, bone marrow biopsy done during hospital stay, and she received Aredia     10/19/2016 Tumor Marker    SPEP showed M-protein 0.4g/dl, serum lambda light chain 5362, ratio 0.00    10/25/2016 Bone Marrow Biopsy     Overall, the marrow is hypercellular with increased lambda-restricted plasma cells (62% aspirate, 90% CD138). There is partial expression of CD20, but no monoclonal B-cell population is detected by flow cytometry.     10/25/2016 Initial Diagnosis    Multiple myeloma ([place])    10/25/2016 Miscellaneous    Cytogenetics and FISH showed loss of 13q, which is intermediate risk for MM     11/10/2016 -  Chemotherapy    Revlimid 10 mg (increased to 94m on cycle 2) daily, 3 weeks on, and one week off, dexamethasone 20 mg weekly, was off from early June to mid July 2018 due to her right hip fracture and hospitalization.  Changed to maintenence Revlimid 136mand stopped dexamethasone starting next cycle, 06/10/17, Revlimid was changed to 63m56maily 3 weeks on, 1 week off in 12/2017        01/02/2017 Imaging     DG Hip Unilat w or w/o Pelvis IMPRESSION 1. Degenerative changes lumbar spine and both hips. No evidence of fracture or dislocation.  2. Diffuse osteopenia. Multiple tiny lucencies are noted throughout both proximal femurs. These may be secondary to the patient's known multiple myeloma.    01/18/2017 Imaging     CT hip right wo contrast  IMPRESSION: Findings consistent with a pathologic fracture of the right femoral neck. A large lytic lesion in the superior aspect of the lateral femoral head  is identified.  Possible nondisplaced fracture of the right inferior pubic ramus.  Diffuse myeloma.    01/18/2017 - 01/23/2017 Hospital Admission    She present to the ED with complaints of severe right hip pain. she recieved a right hip hemiarthroplasty with Dr. MurPercell Miller   CURRENT THERAPY:  maintenance Revlimid 63mg763mily for 3 weeks on and 1 week off starting 05/2017  INTERVAL HISTORY:  Kristin ABBRUZZESEhere for a follow up of MM. She presents to the clinic today with her daughter. She notes no new changes. She notes last week she had some pains in her right hip which has improved, tolerable. She takes [name] BID and oxycodone TID.  She started her newest cycle today. She denies any changes from being on and off Revlimid. She is tolerating well. She notes her BMs are adequate and declines constipation. She takes Metamucil daily. Her daughter notes she is eating better but with a lot of sweets. Her daughter notes pt walks with walker but she hopes to increase in exercise.     REVIEW OF SYSTEMS:   Constitutional: Denies fevers, chills or abnormal weight loss Eyes: Denies blurriness of vision Ears, nose, mouth, throat, and face: Denies mucositis or sore throat Respiratory: Denies cough, dyspnea or wheezes Cardiovascular: Denies palpitation, chest discomfort or lower extremity swelling Gastrointestinal:  Denies nausea, heartburn or change in bowel habits Skin: Denies abnormal skin rashes MSK: (+)  Right hip pain Lymphatics: Denies new lymphadenopathy or easy bruising Neurological:Denies numbness, tingling or new weaknesses Behavioral/Psych: Mood is stable, no new changes  All other systems were reviewed with the patient and are negative.  MEDICAL HISTORY:  Past Medical History:  Diagnosis Date  . Bone cancer [facility])    "being tx'd by Dr. Burr Medico @ [facility]" (10/09/2017)  . Chronic kidney disease (CKD), stage III (moderate) (HCC)   . Depression    Risperdal "twice/day"  (09/23/2016)  . Glaucoma   . Hiatal hernia   . Hypertension   . Intractable back pain    /notes 09/23/2016  . Obesity   . Osteoarthritis    "all over" (10/09/2017)  . Osteoporosis    vertebral fracture in 2012  . Peripheral vascular disease ([place])   . Upper GI bleed 04/2004   secondary to Mallory-Weiss tear/notes 12/28/2010  . Vertigo     SURGICAL HISTORY: Past Surgical History:  Procedure Laterality Date  . ABDOMINAL HYSTERECTOMY  1974   partial  . APPENDECTOMY     as a child/notes 12/28/2010  . BACK SURGERY    . BREAST BIOPSY Left 1999   Archie Endo 12/28/2010  . CATARACT EXTRACTION W/ INTRAOCULAR LENS  IMPLANT, BILATERAL Bilateral 2000s  . ESOPHAGOGASTRODUODENOSCOPY  04/2004   Archie Endo 12/28/2010  . FIXATION KYPHOPLASTY THORACIC SPINE  05/2011   T10/notes 05/25/2011  . FRACTURE SURGERY    . HIP ARTHROPLASTY Right 01/19/2017   Procedure: RIGHT HIP HEMIARTHROPLASTY;  Surgeon: Renette Butters, MD;  Location: [facility];  Service: Orthopedics;  Laterality: Right;  . LAPAROSCOPIC CHOLECYSTECTOMY  10/2007   Archie Endo 12/14/2010  . LOWER EXTREMITY ANGIOGRAPHY N/A 10/09/2017   Procedure: LOWER EXTREMITY ANGIOGRAPHY;  Surgeon: Lorretta Harp, MD;  Location: [facility];  Service: Cardiovascular;  Laterality: N/A;  bilateral  . LOWER EXTREMITY ANGIOGRAPHY  11/21/2017   Procedure: Lower Extremity Angiography;  Surgeon: Adrian Prows, MD;  Location: [place] [facility];  Service: Cardiovascular;;  . PERIPHERAL VASCULAR BALLOON ANGIOPLASTY  10/09/2017   Attempted PTA Archie Endo 10/09/2017  . PERIPHERAL VASCULAR BALLOON ANGIOPLASTY  10/09/2017   Procedure: PERIPHERAL VASCULAR BALLOON ANGIOPLASTY;  Surgeon: Lorretta Harp, MD;  Location: [place] [facility];  Service: Cardiovascular;;  Attempted PTA     I have reviewed the social history and family history with the patient and they are unchanged from previous note.  ALLERGIES:  is allergic to penicillins; tramadol; alendronate; and sulfonamide  derivatives.  MEDICATIONS:  Current Outpatient Medications  Medication Sig Dispense Refill  . amlodipine-atorvastatin (CADUET) 2.5-10 MG tablet Take 1 tablet by mouth daily.    Marland Kitchen aspirin 81 MG chewable tablet Chew 81 mg by mouth daily.    Marland Kitchen atorvastatin (LIPITOR) 10 MG tablet Take 10 mg by mouth daily.    . Calcium Carbonate (CALCIUM 600 PO) Take 600 mg by mouth daily.     . clindamycin (CLEOCIN) 300 MG capsule Take 1 capsule (300 mg total) by mouth 3 (three) times daily. 21 capsule 0  . clopidogrel (PLAVIX) 75 MG tablet Take 75 mg by mouth daily.    . feeding supplement, ENSURE ENLIVE, (ENSURE ENLIVE) LIQD Take 237 mLs by mouth 2 (two) times daily between meals. (Patient taking differently: Take 237 mLs by mouth daily. ) 237 mL 12  . furosemide (LASIX) 20 MG tablet Take 20 mg by mouth daily.    . irbesartan (AVAPRO) 75 MG tablet Take 75 mg by mouth daily after supper.    Marland Kitchen lenalidomide (REVLIMID) 5  MG capsule Take 1 capsule (5 mg total) by mouth daily. ADULT Female:  Authorization # [id number] 08/17/2018 Faxed to [employer] @ [phone] 21 capsule 0  . levofloxacin (LEVAQUIN) 500 MG tablet Take 1 tablet (500 mg total) by mouth daily. 7 tablet 0  . magnesium oxide (MAG-OX) 400 (241.3 Mg) MG tablet Take 1 tablet (400 mg total) by mouth daily. 7 tablet 0  . megestrol (MEGACE) 40 MG tablet Take 1 tablet (40 mg total) by mouth daily.    Marland Kitchen morphine ([name]) 30 MG 12 hr tablet Take 1 tablet (30 mg total) by mouth every 12 (twelve) hours. 60 tablet 0  . ondansetron (ZOFRAN ODT) 4 MG disintegrating tablet Take 1-2 tablets (4-8 mg total) by mouth every 8 (eight) hours as needed for nausea or vomiting. (Patient taking differently: Take 4 mg by mouth daily. ) 30 tablet 2  . Oxycodone HCl 10 MG TABS Take 0.5-1 tablets (5-10 mg total) by mouth every 6 (six) hours as needed. FOR MODERATE OR SEVERE PAIN 90 tablet 0  . RESTASIS 0.05 % ophthalmic emulsion Place 1 drop into both eyes daily.     . risperiDONE  (RISPERDAL) 0.5 MG tablet Take 0.5 mg by mouth daily.     Marland Kitchen SANTYL ointment Apply 1 application topically every other day.  0  . sennosides-docusate sodium (SENOKOT-S) 8.6-50 MG tablet Take 1 tablet by mouth 2 (two) times daily.     Marland Kitchen triamcinolone (KENALOG) 0.025 % cream APPLY TO AFFECTED AREA TWICE A DAY FOR 10 DAYS  1   No current facility-administered medications for this visit.    Facility-Administered Medications Ordered in Other Visits  Medication Dose Route Frequency Provider Last Rate Last Dose  . denosumab (XGEVA) injection 120 mg  120 mg Subcutaneous Once Truitt Merle, MD        PHYSICAL EXAMINATION: ECOG PERFORMANCE STATUS: 3 - Symptomatic, >50% confined to bed  Vitals:   08/29/18 1606  BP: (!) 153/67  Pulse: 60  Resp: 17  Temp: 98.1 F (36.7 C)  SpO2: 98%   Filed Weights   08/29/18 1606  Weight: 145 lb 3.2 oz (65.9 kg)    GENERAL:alert, no distress and comfortable SKIN: skin color, texture, turgor are normal, no rashes or significant lesions EYES: normal, Conjunctiva are pink and non-injected, sclera clear OROPHARYNX:no exudate, no erythema and lips, buccal mucosa, and tongue normal  NECK: supple, thyroid normal size, non-tender, without nodularity LYMPH:  no palpable lymphadenopathy in the cervical, axillary or inguinal LUNGS: clear to auscultation and percussion with normal breathing effort HEART: regular rate & rhythm and no murmurs (+) very mild b/l lower extremity edema ABDOMEN:abdomen soft, non-tender and normal bowel sounds Musculoskeletal:no cyanosis of digits and no clubbing  NEURO: alert & oriented x 3 with fluent speech, no focal motor/sensory deficits  LABORATORY DATA:  I have reviewed the data as listed CBC Latest Ref Rng & Units 08/29/2018 07/18/2018 06/04/2018  WBC 4.0 - 10.5 K/uL 5.7 6.4 6.9  Hemoglobin 12.0 - 15.0 g/dL 10.8(L) 11.1(L) 11.6(L)  Hematocrit 36.0 - 46.0 % 32.0(L) 32.9(L) 34.6(L)  Platelets 150 - 400 K/uL 155 193 225     CMP  Latest Ref Rng & Units 08/29/2018 07/18/2018 06/04/2018  Glucose 70 - 99 mg/dL 126(H) 133(H) 140(H)  BUN 8 - 23 mg/dL 22 15 27(H)  Creatinine 0.44 - 1.00 mg/dL 1.71(H) 1.35(H) 1.48(H)  Sodium 135 - 145 mmol/L 141 140 142  Potassium 3.5 - 5.1 mmol/L 3.5 3.7 4.1  Chloride 98 -  111 mmol/L 105 106 107  CO2 22 - 32 mmol/L 29 28 25   Calcium 8.9 - 10.3 mg/dL 9.1 8.9 9.6  Total Protein 6.5 - 8.1 g/dL 7.0 6.8 7.6  Total Bilirubin 0.3 - 1.2 mg/dL 0.6 0.5 0.5  Alkaline Phos 38 - 126 U/L 94 127(H) 116  AST 15 - 41 U/L 26 23 34  ALT 0 - 44 U/L 23 24 37    MM LAB:  M-protein (g/dl) 10/19/2016: 0.4 11/01/2016: Not Det 12/05/16: Not Det 01/02/17: Not det 02/27/17: Not det 03/24/17: not det.  04/24/17: not det.  05/19/17: not det. 06/16/17:: not det 07/21/17: not det 08/18/17: not det 09/15/17: not det 10/27/17: Not det 11/24/17: not det 12/22/17: not det 01/30/18: not det 02/27/18: not det 03/27/18: Not det 04/24/18: not det.  06/04/18: Not Observed 07/18/18: Not Observed  Serum kappa, lamda light chains and ratio (mg/L) 10/21/2016: 14.8, 5362, 0.00 11/01/2016: 1.49, 10.88, 0.14 12/05/2016: 2.16, 19.31, 0.11 01/02/17: 3.01, 10.90, 0.28 02/27/17: 1.49, 20.64, 0.07 03/24/17: 2.19, 3.90, 0.56 04/24/17: 2.58, 4.02, 0.64 05/19/17: 2.22, 3.55, 0.63 06/16/17:: 4.18, 5.55, 0.75 07/21/17: 3.86, 5.10, 0.76 08/18/17: 2.90, 3.58, 0.81 09/15/17: 5.53. 5.47, 1.01 10/27/17: 7.86, 8.58, 0.92 11/24/17: 8.86, 8.16, 1.09 12/22/17: 7.79, 6.77, 1.15 01/30/18: 5.79, 4.70, 1.23 02/27/18: 5.27, 4.30, 1.23 03/27/18: 5.98, 5.97, 1 04/24/18: 4.63, 4.12, 1.12 06/04/18: 4.28, 4.01, 1.07 07/18/18: 5.97, 5.5.6, 1.07   24 hour urine (total protein, free lambda, ration) 10/21/2016: 1463, 1570, 0.06   RADIOGRAPHIC STUDIES: I have personally reviewed the radiological images as listed and agreed with the findings in the report. No results found.   ASSESSMENT & PLAN:  Kristin [name] is a 85 y.o. female with   1. Multiple Myeloma, lambda  light chain disease, staging III, intermediate risk (13q-) -She was diagnosed in March 2018, initially treated with Revlimid and dexamethasone for 6 month with good response and ir currently being treated with maintenance Revlimid 5 mg once daily 3 weeks on 1 week off. -Delton See has been held due to concern of jaw necrosis -Her M protein has been nondetectable. She will repeat MM panel every 8 weeks -She is clinically doing well, Labs reviewed, CBC WNL except hg at 10.8, CMP and MM panel is still pending. Physical exam unremarkable.  -I encouraged her to continue eating adequately, she does not have to take Megace unless she needs it.  -Her daughter suggest PT to get her more physically active, but she was reluctant. She does currently ambulate with walker. I will send referral and she will think about it.  -Continue Revlimid -Now that MM is stable, I will see her every 2 months   2. Anemia  -Due to MM and Revlimid. -Will consider blood transfusion if Hb <8 or symptomatic anemia with Hg 8-9 -Labs reviewed, Hg at 10.8 today (08/29/18) stable overall  3. Hypercalcemia/hypocalcemia -resolved  -today's CMP pending   4. Chronic CKD, stage III -Avoid nephrotoxic drugs and contrasts. -Cr at 1.71 today (08/29/18)  5. Severe back pain and right hip pain from pathologic fracture -Compression fracture at T2 -Pain controlled with Morphine and oxycodone. I refilled today  -Pain mild and stable.   6. HTN -f/u with PCP and continue medications -BP elevated at 153/67 today (08/29/18)  7. Osteoporosis -Xgeva started in 01/02/18 and has been held since 03/27/18 due to concern for jaw necrosis -Continue calcium supplements  8. Goal of care discussion  -The patient understands the goal of care is palliative. -I previously recommended DNR/DNI, she will  think about it   9. Cellulitis vs osteonecrosis of the jaw -Off Xgeva -She will continue mouthwash as per Dr. Sabra Heck -She denies jaw pain,  stable.   10. Left sided back/flank pain along left costal margin, chronic right hip pain  -overall stable and controlled  -She takes [name] 30 mg twice daily, and oxycodone 5-35m 3 times a day as needed, pain has been well controlled lately.   Plan  -refilled morphine and oxycodone today -Continue Revlimid  -Lab and f/u in 2 months     No problem-specific Assessment & Plan notes found for this encounter.   Orders Placed This Encounter  Procedures  . Ambulatory referral to Physical Therapy    Referral Priority:   Routine    Referral Type:   Physical Medicine    Referral Reason:   Specialty Services Required    Requested Specialty:   Physical Therapy    Number of Visits Requested:   1   All questions were answered. The patient knows to call the clinic with any problems, questions or concerns. No barriers to learning was detected. I spent 20 minutes counseling the patient face to face. The total time spent in the appointment was 25 minutes and more than 50% was on counseling and review of test results     YTruitt Merle MD 08/29/2018   I, AJoslyn Devon am acting as scribe for YTruitt Merle MD.   I have reviewed the above documentation for accuracy and completeness, and I agree with the above.

## 2018-08-29 ENCOUNTER — Inpatient Hospital Stay: Payer: Medicare Other

## 2018-08-29 ENCOUNTER — Telehealth: Payer: Self-pay | Admitting: Hematology

## 2018-08-29 ENCOUNTER — Inpatient Hospital Stay: Payer: Medicare Other | Attending: Hematology | Admitting: Hematology

## 2018-08-29 ENCOUNTER — Encounter: Payer: Self-pay | Admitting: Hematology

## 2018-08-29 VITALS — BP 153/67 | HR 60 | Temp 98.1°F | Resp 17 | Ht 59.0 in | Wt 145.2 lb

## 2018-08-29 DIAGNOSIS — D630 Anemia in neoplastic disease: Secondary | ICD-10-CM

## 2018-08-29 DIAGNOSIS — N183 Chronic kidney disease, stage 3 (moderate): Secondary | ICD-10-CM | POA: Insufficient documentation

## 2018-08-29 DIAGNOSIS — I129 Hypertensive chronic kidney disease with stage 1 through stage 4 chronic kidney disease, or unspecified chronic kidney disease: Secondary | ICD-10-CM | POA: Insufficient documentation

## 2018-08-29 DIAGNOSIS — L03211 Cellulitis of face: Secondary | ICD-10-CM | POA: Insufficient documentation

## 2018-08-29 DIAGNOSIS — Z9221 Personal history of antineoplastic chemotherapy: Secondary | ICD-10-CM | POA: Diagnosis not present

## 2018-08-29 DIAGNOSIS — M25551 Pain in right hip: Secondary | ICD-10-CM | POA: Insufficient documentation

## 2018-08-29 DIAGNOSIS — K449 Diaphragmatic hernia without obstruction or gangrene: Secondary | ICD-10-CM | POA: Insufficient documentation

## 2018-08-29 DIAGNOSIS — D649 Anemia, unspecified: Secondary | ICD-10-CM | POA: Insufficient documentation

## 2018-08-29 DIAGNOSIS — M810 Age-related osteoporosis without current pathological fracture: Secondary | ICD-10-CM | POA: Diagnosis not present

## 2018-08-29 DIAGNOSIS — I739 Peripheral vascular disease, unspecified: Secondary | ICD-10-CM | POA: Insufficient documentation

## 2018-08-29 DIAGNOSIS — C9000 Multiple myeloma not having achieved remission: Secondary | ICD-10-CM | POA: Insufficient documentation

## 2018-08-29 DIAGNOSIS — Z7982 Long term (current) use of aspirin: Secondary | ICD-10-CM | POA: Diagnosis not present

## 2018-08-29 DIAGNOSIS — M1990 Unspecified osteoarthritis, unspecified site: Secondary | ICD-10-CM | POA: Insufficient documentation

## 2018-08-29 DIAGNOSIS — M8000XS Age-related osteoporosis with current pathological fracture, unspecified site, sequela: Secondary | ICD-10-CM

## 2018-08-29 DIAGNOSIS — Z79899 Other long term (current) drug therapy: Secondary | ICD-10-CM | POA: Diagnosis not present

## 2018-08-29 DIAGNOSIS — E669 Obesity, unspecified: Secondary | ICD-10-CM | POA: Diagnosis not present

## 2018-08-29 DIAGNOSIS — I10 Essential (primary) hypertension: Secondary | ICD-10-CM

## 2018-08-29 DIAGNOSIS — M549 Dorsalgia, unspecified: Secondary | ICD-10-CM | POA: Diagnosis not present

## 2018-08-29 DIAGNOSIS — F329 Major depressive disorder, single episode, unspecified: Secondary | ICD-10-CM | POA: Insufficient documentation

## 2018-08-29 LAB — CBC WITH DIFFERENTIAL/PLATELET
Abs Immature Granulocytes: 0.02 10*3/uL (ref 0.00–0.07)
Basophils Absolute: 0.1 10*3/uL (ref 0.0–0.1)
Basophils Relative: 1 %
Eosinophils Absolute: 0.5 10*3/uL (ref 0.0–0.5)
Eosinophils Relative: 8 %
HCT: 32 % — ABNORMAL LOW (ref 36.0–46.0)
Hemoglobin: 10.8 g/dL — ABNORMAL LOW (ref 12.0–15.0)
Immature Granulocytes: 0 %
LYMPHS PCT: 44 %
Lymphs Abs: 2.5 10*3/uL (ref 0.7–4.0)
MCH: 32 pg (ref 26.0–34.0)
MCHC: 33.8 g/dL (ref 30.0–36.0)
MCV: 95 fL (ref 80.0–100.0)
Monocytes Absolute: 0.5 10*3/uL (ref 0.1–1.0)
Monocytes Relative: 9 %
Neutro Abs: 2.2 10*3/uL (ref 1.7–7.7)
Neutrophils Relative %: 38 %
Platelets: 155 10*3/uL (ref 150–400)
RBC: 3.37 MIL/uL — AB (ref 3.87–5.11)
RDW: 15.3 % (ref 11.5–15.5)
WBC: 5.7 10*3/uL (ref 4.0–10.5)
nRBC: 0 % (ref 0.0–0.2)

## 2018-08-29 LAB — COMPREHENSIVE METABOLIC PANEL
ALT: 23 U/L (ref 0–44)
AST: 26 U/L (ref 15–41)
Albumin: 3.4 g/dL — ABNORMAL LOW (ref 3.5–5.0)
Alkaline Phosphatase: 94 U/L (ref 38–126)
Anion gap: 7 (ref 5–15)
BUN: 22 mg/dL (ref 8–23)
CHLORIDE: 105 mmol/L (ref 98–111)
CO2: 29 mmol/L (ref 22–32)
CREATININE: 1.71 mg/dL — AB (ref 0.44–1.00)
Calcium: 9.1 mg/dL (ref 8.9–10.3)
GFR calc Af Amer: 31 mL/min — ABNORMAL LOW (ref >60)
GFR calc non Af Amer: 27 mL/min — ABNORMAL LOW (ref >60)
Glucose, Bld: 126 mg/dL — ABNORMAL HIGH (ref 70–99)
POTASSIUM: 3.5 mmol/L (ref 3.5–5.1)
Sodium: 141 mmol/L (ref 135–145)
Total Bilirubin: 0.6 mg/dL (ref 0.3–1.2)
Total Protein: 7 g/dL (ref 6.5–8.1)

## 2018-08-29 MED ORDER — OXYCODONE HCL 10 MG PO TABS: 5.0000 mg | ORAL_TABLET | Freq: Four times a day (QID) | ORAL | 0 refills | Status: DC | PRN

## 2018-08-29 MED ORDER — MORPHINE SULFATE ER 30 MG PO TBCR: 30.0000 mg | EXTENDED_RELEASE_TABLET | Freq: Two times a day (BID) | ORAL | 0 refills | Status: DC

## 2018-08-29 NOTE — Telephone Encounter (Signed)
Printed calendar and avs. °

## 2018-08-30 LAB — KAPPA/LAMBDA LIGHT CHAINS
Kappa free light chain: 76.1 mg/L — ABNORMAL HIGH (ref 3.3–19.4)
Kappa, lambda light chain ratio: 1.24 (ref 0.26–1.65)
LAMDA FREE LIGHT CHAINS: 61.2 mg/L — AB (ref 5.7–26.3)

## 2018-08-31 LAB — MULTIPLE MYELOMA PANEL, SERUM
ALBUMIN SERPL ELPH-MCNC: 3.6 g/dL (ref 2.9–4.4)
Albumin/Glob SerPl: 1.1 (ref 0.7–1.7)
Alpha 1: 0.2 g/dL (ref 0.0–0.4)
Alpha2 Glob SerPl Elph-Mcnc: 0.7 g/dL (ref 0.4–1.0)
B-Globulin SerPl Elph-Mcnc: 0.9 g/dL (ref 0.7–1.3)
Gamma Glob SerPl Elph-Mcnc: 1.5 g/dL (ref 0.4–1.8)
Globulin, Total: 3.3 g/dL (ref 2.2–3.9)
IgA: 563 mg/dL — ABNORMAL HIGH (ref 64–422)
IgG (Immunoglobin G), Serum: 1526 mg/dL (ref 700–1600)
IgM (Immunoglobulin M), Srm: 56 mg/dL (ref 26–217)
Total Protein ELP: 6.9 g/dL (ref 6.0–8.5)

## 2018-09-05 ENCOUNTER — Encounter: Payer: Self-pay | Admitting: Physical Therapy

## 2018-09-05 ENCOUNTER — Other Ambulatory Visit: Payer: Self-pay

## 2018-09-05 ENCOUNTER — Ambulatory Visit: Payer: Medicare Other | Attending: Hematology | Admitting: Physical Therapy

## 2018-09-05 DIAGNOSIS — M6281 Muscle weakness (generalized): Secondary | ICD-10-CM | POA: Diagnosis present

## 2018-09-05 DIAGNOSIS — R2689 Other abnormalities of gait and mobility: Secondary | ICD-10-CM | POA: Insufficient documentation

## 2018-09-05 NOTE — Patient Instructions (Signed)
  Access Code: [email or web address]  URL: [email or web address]  Date: 09/05/2018  Prepared by: Venetia Night Beuhring   Exercises  Seated Heel Toe Raises - 10 reps - 3 sets - 1x daily - 7x weekly  Seated Long Arc Quad - 10 reps - 3 sets - 1x daily - 7x weekly  Proper Sit to Stand Technique - 5 reps - 2 sets - 1x daily - 7x weekly

## 2018-09-05 NOTE — Therapy (Signed)
[facility] [address], [place], [place], [address] Phone: [phone]   Fax:  [phone]  Physical Therapy Evaluation  Patient Details  Name: Kristin [name] MRN: [id number] Date of Birth: [birth year] Referring Provider (PT): Truitt Merle, MD   Encounter Date: 09/05/2018  PT End of Session - 09/05/18 1658    Visit Number  1    Date for PT Re-Evaluation  10/31/18    Authorization Time Period  09/05/18-10/31/18    PT Start Time  1620    PT Stop Time  1655    PT Time Calculation (min)  35 min    Activity Tolerance  Patient limited by fatigue    Behavior During Therapy  [facility] for tasks assessed/performed       Past Medical History:  Diagnosis Date  . Bone cancer [facility])    "being tx'd by Dr. Burr Medico @ [facility]" (10/09/2017)  . Chronic kidney disease (CKD), stage III (moderate) (HCC)   . Depression    Risperdal "twice/day" (09/23/2016)  . Glaucoma   . Hiatal hernia   . Hypertension   . Intractable back pain    /notes 09/23/2016  . Obesity   . Osteoarthritis    "all over" (10/09/2017)  . Osteoporosis    vertebral fracture in 2012  . Peripheral vascular disease ([place])   . Upper GI bleed 04/2004   secondary to Mallory-Weiss tear/notes 12/28/2010  . Vertigo     Past Surgical History:  Procedure Laterality Date  . ABDOMINAL HYSTERECTOMY  1974   partial  . APPENDECTOMY     as a child/notes 12/28/2010  . BACK SURGERY    . BREAST BIOPSY Left 1999   Archie Endo 12/28/2010  . CATARACT EXTRACTION W/ INTRAOCULAR LENS  IMPLANT, BILATERAL Bilateral 2000s  . ESOPHAGOGASTRODUODENOSCOPY  04/2004   Archie Endo 12/28/2010  . FIXATION KYPHOPLASTY THORACIC SPINE  05/2011   T10/notes 05/25/2011  . FRACTURE SURGERY    . HIP ARTHROPLASTY Right 01/19/2017   Procedure: RIGHT HIP HEMIARTHROPLASTY;  Surgeon: Renette Butters, MD;  Location: [place];  Service: Orthopedics;  Laterality: Right;  . LAPAROSCOPIC CHOLECYSTECTOMY  10/2007   Archie Endo 12/14/2010   . LOWER EXTREMITY ANGIOGRAPHY N/A 10/09/2017   Procedure: LOWER EXTREMITY ANGIOGRAPHY;  Surgeon: Lorretta Harp, MD;  Location: [place] [facility];  Service: Cardiovascular;  Laterality: N/A;  bilateral  . LOWER EXTREMITY ANGIOGRAPHY  11/21/2017   Procedure: Lower Extremity Angiography;  Surgeon: Adrian Prows, MD;  Location: [place] [facility];  Service: Cardiovascular;;  . PERIPHERAL VASCULAR BALLOON ANGIOPLASTY  10/09/2017   Attempted PTA Archie Endo 10/09/2017  . PERIPHERAL VASCULAR BALLOON ANGIOPLASTY  10/09/2017   Procedure: PERIPHERAL VASCULAR BALLOON ANGIOPLASTY;  Surgeon: Lorretta Harp, MD;  Location: [place] [facility];  Service: Cardiovascular;;  Attempted PTA     There were no vitals filed for this visit.   Subjective Assessment - 09/05/18 1629    Subjective  Pt reports to PT with full time use of rollator walker.  She has been fighting multiple myeloma and feels weak and unwell overall.  She feels unstable on her feet.      Pertinent History  complex medical history including mulitple myeloma    Limitations  Standing;Walking;House hold activities;Lifting    How long can you stand comfortably?  30 min    How long can you walk comfortably?  avoids walking, only goes as far as she needs to    Patient Stated Goals  walk greater distances  Currently in Pain?  No/denies         [facility] PT Assessment - 09/05/18 0001      Assessment   Medical Diagnosis  C90.00 (ICD-10-CM) - Multiple myeloma not having achieved remission [facility])    Referring Provider (PT)  Truitt Merle, MD    Hand Dominance  Right    Next MD Visit  --   2 months   Prior Therapy  yes, seen at this facility in 2018      Precautions   Precautions  Fall      Restrictions   Weight Bearing Restrictions  No      Balance Screen   Has the patient fallen in the past 6 months  No    Has the patient had a decrease in activity level because of a fear of falling?   No    Is the patient reluctant to leave their home because  of a fear of falling?   No      Home Environment   Living Environment  Private residence    Living Arrangements  Alone    Available Help at Discharge  --   has an aide that comes 5 days a week in morning   Type of Home  Apartment    Home Access  Level entry    [place] bars - toilet;Grab bars - tub/shower;Shower seat;Bedside commode;Walker - 4 wheels;Wheelchair - manual      Prior Function   Level of Independence  Needs assistance with ADLs    Vocation  Retired    Surveyor, mining comes M-F AM only      Cognition   Overall Cognitive Status  Within Functional Limits for tasks assessed      Posture/Postural Control   Posture/Postural Control  Postural limitations    Postural Limitations  Flexed trunk;Forward head;Rounded Shoulders      ROM / Strength   AROM / PROM / Strength  Strength      Strength   Overall Strength Comments  grossly 3+/5 throughout bil LEs      Transfers   Five time sit to stand comments   BUE support x 37 sec      Ambulation/Gait   Ambulation/Gait  Yes    Ambulation/Gait Assistance  6: Modified independent (Device/Increase time)    Ambulation Distance (Feet)  100 Feet    Assistive device  Rollator    Gait Pattern  Step-through pattern;Trunk flexed;Wide base of support;Decreased step length - right;Decreased step length - left    Ambulation Surface  Level;Indoor      Standardized Balance Assessment   Standardized Balance Assessment  Timed Up and Go Test      Timed Up and Go Test   TUG Comments  next visit, Pt requested to stop evaluation due to fatigue                Objective measurements completed on examination: See above findings.              PT Education - 09/05/18 1700    Education Details  Access Code: [email or web address]    Person(s) Educated  Patient    Methods  Explanation;Demonstration;Verbal cues;Handout    Comprehension  Verbalized understanding;Returned demonstration       PT Short  Term Goals - 09/05/18 1708      PT SHORT TERM GOAL #1   Title  I in initial HEP targeting UE/LE strength for  posture and improved strength for gait and transfers.    Time  4    Period  Weeks    Status  New    Target Date  10/03/18      PT SHORT TERM GOAL #2   Title  Pt will be able to perform TUG test to estabilish baseline for a LTG.    Time  2    Period  Weeks    Status  New    Target Date  09/19/18      PT SHORT TERM GOAL #3   Title  Pt will report at least 20% greater endurance with gait for household distances.    Time  4    Period  Weeks    Status  New    Target Date  10/03/18        PT Long Term Goals - 09/05/18 1710      PT LONG TERM GOAL #1   Title  Pt will be I in advanced HEP to maintain gains from PT.    Time  8    Period  Weeks    Status  New    Target Date  10/31/18      PT LONG TERM GOAL #2   Title  Pt will perform 5x sit to stand in </= 28 sec demonstrating reduction in fall risk.    Time  8    Period  Weeks    Status  New    Target Date  10/31/18      PT LONG TERM GOAL #3   Title  Pt will report at least 40% greater tolerance with walking household distances with rollator.    Time  8    Period  Weeks    Status  New    Target Date  10/31/18      PT LONG TERM GOAL #4   Title  Pt with achieve at least 4-/5 throughout bil LEs to improve strength for gait and transfers.    Time  8    Period  Weeks    Status  New    Target Date  10/31/18             Plan - 09/05/18 1701    Clinical Impression Statement  Pt is an 85yo female with complex medical history including multiple myeloma who is referred to PT to increase overall mobility.  Her specific goal is to walk greater distances.  She uses a rollator full time and reports she rarely leaves the house.  She has an aide M-F in the AM hours to help with ADLs.  She was easily fatigued during today's evaluation session and asked to stop before all information was collected and measured.  She has  grosly 3+/5 strength in bil LEs, gait dysfunction with rolling walker including flexed trunk, wide base of support and short stride length, and time of 37 sec on 5x sit to stand placing her at significant fall risk.  PT began her on 3 exercises today and will perform TUG test next visit to complete goals.    History and Personal Factors relevant to plan of care:  high fall risk, multiple myeloma, Rt THA 2018    Clinical Presentation  Stable    Clinical Presentation due to:  complex medical history, inactivity    Clinical Decision Making  Low    Rehab Potential  Good    PT Frequency  2x / week    PT Duration  8 weeks  PT Treatment/Interventions  ADLs/Self Care Home Management;Moist Heat;Therapeutic exercise;Therapeutic activities;Functional mobility training;Balance training;Neuromuscular re-education;Patient/family education;Manual techniques;Passive range of motion;Taping;Electrical Stimulation    PT Next Visit Plan  f/u on HEP, perform TUG and add LTG for TUG, gait, endurance, sit to stand, seated UE/LE ther ex    PT Home Exercise Plan  Access Code: [email or web address]    Consulted and Agree with Plan of Care  Patient       Patient will benefit from skilled therapeutic intervention in order to improve the following deficits and impairments:  Abnormal gait, Improper body mechanics, Decreased mobility, Postural dysfunction, Decreased activity tolerance, Decreased endurance, Decreased range of motion, Decreased strength, Decreased balance, Difficulty walking, Impaired flexibility  Visit Diagnosis: Muscle weakness (generalized) - Plan: PT plan of care cert/re-cert  Other abnormalities of gait and mobility - Plan: PT plan of care cert/re-cert     Problem List Patient Active Problem List   Diagnosis Date Noted  . Critical lower limb ischemia 10/04/2017  . Advance care planning   . Goals of care, counseling/discussion   . Palliative care by specialist   . CKD (chronic kidney disease), stage III  ([place]) 01/18/2017  . Hip fracture, pathological ([place]) 01/18/2017  . Pathologic hip fracture ([place]) 01/18/2017  . DNR (do not resuscitate) discussion 11/01/2016  . Multiple myeloma ([place]) 10/31/2016  . Acute encephalopathy 10/18/2016  . Hypercalcemia 10/18/2016  . Fall   . Anemia in neoplastic disease 09/23/2016  . Sepsis ([place]) 09/23/2016  . Back pain 09/23/2016  . Intractable back pain 06/20/2016  . Other malaise and fatigue 11/26/2013  . Vertigo   . Obesity   . Arthritis of knee, left 07/09/2013  . Need for prophylactic vaccination and inoculation against influenza 07/09/2013  . Osteoporosis with pathological fracture 01/01/2013  . HLD (hyperlipidemia) 01/01/2013  . HTN (hypertension) 01/01/2013  . Chronic back pain 01/01/2013  . Vertebral compression fracture ([place]) 01/01/2013  . Seasonal allergic rhinitis 01/01/2013    Baruch Merl, PT 09/05/18 5:17 PM   [place] [address] [place], [place], [address] Phone: [phone]   Fax:  [phone]  Name: Kristin [name] MRN: [id number] Date of Birth: December 05, 1930

## 2018-09-12 ENCOUNTER — Ambulatory Visit: Payer: Medicare Other | Admitting: Physical Therapy

## 2018-09-12 ENCOUNTER — Encounter: Payer: Self-pay | Admitting: Physical Therapy

## 2018-09-12 DIAGNOSIS — M6281 Muscle weakness (generalized): Secondary | ICD-10-CM | POA: Diagnosis not present

## 2018-09-12 DIAGNOSIS — R2689 Other abnormalities of gait and mobility: Secondary | ICD-10-CM

## 2018-09-12 NOTE — Therapy (Signed)
[facility] [address] [place], [place], [address] Phone: [phone]   Fax:  [phone]  Physical Therapy Treatment  Patient Details  Name: Kristin [name] MRN: [id number] Date of Birth: [birth year] Referring Provider (PT): Truitt Merle, MD   Encounter Date: 09/12/2018  PT End of Session - 09/12/18 1542    Visit Number  2    Date for PT Re-Evaluation  10/31/18    Authorization Type  Med pay    Authorization Time Period  09/05/18-10/31/18    PT Start Time  1537    PT Stop Time  1610    PT Time Calculation (min)  33 min    Activity Tolerance  Patient limited by fatigue    Behavior During Therapy  [facility] for tasks assessed/performed       Past Medical History:  Diagnosis Date  . Bone cancer [facility])    "being tx'd by Dr. Burr Medico @ [facility]" (10/09/2017)  . Chronic kidney disease (CKD), stage III (moderate) (HCC)   . Depression    Risperdal "twice/day" (09/23/2016)  . Glaucoma   . Hiatal hernia   . Hypertension   . Intractable back pain    /notes 09/23/2016  . Obesity   . Osteoarthritis    "all over" (10/09/2017)  . Osteoporosis    vertebral fracture in 2012  . Peripheral vascular disease ([place])   . Upper GI bleed 04/2004   secondary to Mallory-Weiss tear/notes 12/28/2010  . Vertigo     Past Surgical History:  Procedure Laterality Date  . ABDOMINAL HYSTERECTOMY  1974   partial  . APPENDECTOMY     as a child/notes 12/28/2010  . BACK SURGERY    . BREAST BIOPSY Left 1999   Archie Endo 12/28/2010  . CATARACT EXTRACTION W/ INTRAOCULAR LENS  IMPLANT, BILATERAL Bilateral 2000s  . ESOPHAGOGASTRODUODENOSCOPY  04/2004   Archie Endo 12/28/2010  . FIXATION KYPHOPLASTY THORACIC SPINE  05/2011   T10/notes 05/25/2011  . FRACTURE SURGERY    . HIP ARTHROPLASTY Right 01/19/2017   Procedure: RIGHT HIP HEMIARTHROPLASTY;  Surgeon: Renette Butters, MD;  Location: [place];  Service: Orthopedics;  Laterality: Right;  . LAPAROSCOPIC  CHOLECYSTECTOMY  10/2007   Archie Endo 12/14/2010  . LOWER EXTREMITY ANGIOGRAPHY N/A 10/09/2017   Procedure: LOWER EXTREMITY ANGIOGRAPHY;  Surgeon: Lorretta Harp, MD;  Location: [place] [facility];  Service: Cardiovascular;  Laterality: N/A;  bilateral  . LOWER EXTREMITY ANGIOGRAPHY  11/21/2017   Procedure: Lower Extremity Angiography;  Surgeon: Adrian Prows, MD;  Location: [place] [facility];  Service: Cardiovascular;;  . PERIPHERAL VASCULAR BALLOON ANGIOPLASTY  10/09/2017   Attempted PTA Archie Endo 10/09/2017  . PERIPHERAL VASCULAR BALLOON ANGIOPLASTY  10/09/2017   Procedure: PERIPHERAL VASCULAR BALLOON ANGIOPLASTY;  Surgeon: Lorretta Harp, MD;  Location: [place] [facility];  Service: Cardiovascular;;  Attempted PTA     There were no vitals filed for this visit.  Subjective Assessment - 09/12/18 1541    Subjective  Not a good day for me. I do not feel good overall today.    Pertinent History  complex medical history including mulitple myeloma    Currently in Pain?  Yes    Pain Score  3     Pain Orientation  Right   side   Aggravating Factors   Good days, not so good days    Pain Relieving Factors  Unsure    Multiple Pain Sites  No         [facility] PT  Assessment - 09/12/18 0001      Timed Up and Go Test   Normal TUG (seconds)  38   with rollator                  [facility] Adult PT Treatment/Exercise - 09/12/18 0001      Ambulation/Gait   Ambulation Distance (Feet)  80 Feet   rest, then 160 feet with Rollator: SBA    Assistive device  Rollator    Ambulation Surface  Level;Indoor      Lumbar Exercises: Aerobic   Nustep  L1 x 4 min   PTA present to monitor fatigue     Knee/Hip Exercises: Seated   Long Arc Quad  Strengthening;Both;1 set;10 reps;Weights    Long Arc Quad Weight  1 lbs.    Ball Squeeze  10x   VC to squeeze with her knees   Clamshell with TheraBand  Red   10x   Marching  --   10x              PT Short Term Goals - 09/05/18 1708      PT SHORT  TERM GOAL #1   Title  I in initial HEP targeting UE/LE strength for posture and improved strength for gait and transfers.    Time  4    Period  Weeks    Status  New    Target Date  10/03/18      PT SHORT TERM GOAL #2   Title  Pt will be able to perform TUG test to estabilish baseline for a LTG.    Time  2    Period  Weeks    Status  New    Target Date  09/19/18      PT SHORT TERM GOAL #3   Title  Pt will report at least 20% greater endurance with gait for household distances.    Time  4    Period  Weeks    Status  New    Target Date  10/03/18        PT Long Term Goals - 09/05/18 1710      PT LONG TERM GOAL #1   Title  Pt will be I in advanced HEP to maintain gains from PT.    Time  8    Period  Weeks    Status  New    Target Date  10/31/18      PT LONG TERM GOAL #2   Title  Pt will perform 5x sit to stand in </= 28 sec demonstrating reduction in fall risk.    Time  8    Period  Weeks    Status  New    Target Date  10/31/18      PT LONG TERM GOAL #3   Title  Pt will report at least 40% greater tolerance with walking household distances with rollator.    Time  8    Period  Weeks    Status  New    Target Date  10/31/18      PT LONG TERM GOAL #4   Title  Pt with achieve at least 4-/5 throughout bil LEs to improve strength for gait and transfers.    Time  8    Period  Weeks    Status  New    Target Date  10/31/18            Plan - 09/12/18 1611    Clinical Impression Statement  Pt  presents today with reports of not feeling her greatest, as she reports good days and bad days. No specific complaints just an overall "bad" feeling. She ended up tolerating around 33 minutes of therapy that included seated LE strength ( low level) and walking for endurance. She demonstrated fairly good posture , she was really trying to do her best to stand up straight. TUG was performed for baseline today. She was able to ambulate 160 feet at one time with her rollator with  reported "moderate fatigue."    Rehab Potential  Good    PT Frequency  2x / week    PT Duration  8 weeks    PT Treatment/Interventions  ADLs/Self Care Home Management;Moist Heat;Therapeutic exercise;Therapeutic activities;Functional mobility training;Balance training;Neuromuscular re-education;Patient/family education;Manual techniques;Passive range of motion;Taping;Electrical Stimulation    PT Next Visit Plan  Set LTg for TUG. Review HEP, try adding sit to stand and UE exercises to either session or both session and HEP.     PT Home Exercise Plan  Access Code: [email or web address]    Consulted and Agree with Plan of Care  Patient       Patient will benefit from skilled therapeutic intervention in order to improve the following deficits and impairments:  Abnormal gait, Improper body mechanics, Decreased mobility, Postural dysfunction, Decreased activity tolerance, Decreased endurance, Decreased range of motion, Decreased strength, Decreased balance, Difficulty walking, Impaired flexibility  Visit Diagnosis: Muscle weakness (generalized)  Other abnormalities of gait and mobility     Problem List Patient Active Problem List   Diagnosis Date Noted  . Critical lower limb ischemia 10/04/2017  . Advance care planning   . Goals of care, counseling/discussion   . Palliative care by specialist   . CKD (chronic kidney disease), stage III ([place]) 01/18/2017  . Hip fracture, pathological ([place]) 01/18/2017  . Pathologic hip fracture ([place]) 01/18/2017  . DNR (do not resuscitate) discussion 11/01/2016  . Multiple myeloma ([place]) 10/31/2016  . Acute encephalopathy 10/18/2016  . Hypercalcemia 10/18/2016  . Fall   . Anemia in neoplastic disease 09/23/2016  . Sepsis ([place]) 09/23/2016  . Back pain 09/23/2016  . Intractable back pain 06/20/2016  . Other malaise and fatigue 11/26/2013  . Vertigo   . Obesity   . Arthritis of knee, left 07/09/2013  . Need for prophylactic vaccination and inoculation against  influenza 07/09/2013  . Osteoporosis with pathological fracture 01/01/2013  . HLD (hyperlipidemia) 01/01/2013  . HTN (hypertension) 01/01/2013  . Chronic back pain 01/01/2013  . Vertebral compression fracture ([place]) 01/01/2013  . Seasonal allergic rhinitis 01/01/2013    Rachid Parham, PTA 09/12/2018, 4:16 PM  [facility] [address] [place], [place], [address] Phone: [phone]   Fax:  [phone]  Name: Kristin [name] MRN: [id number] Date of Birth: [birth year]

## 2018-09-17 ENCOUNTER — Encounter: Payer: Self-pay | Admitting: Physical Therapy

## 2018-09-17 ENCOUNTER — Ambulatory Visit: Payer: Medicare Other | Attending: Hematology | Admitting: Physical Therapy

## 2018-09-17 DIAGNOSIS — M6281 Muscle weakness (generalized): Secondary | ICD-10-CM | POA: Diagnosis present

## 2018-09-17 DIAGNOSIS — R2689 Other abnormalities of gait and mobility: Secondary | ICD-10-CM | POA: Diagnosis present

## 2018-09-17 NOTE — Therapy (Signed)
[facility] [address], [place], [place], [address] Phone: [phone]   Fax:  [phone]  Physical Therapy Treatment  Patient Details  Name: Kristin [name] MRN: [id number] Date of Birth: 1930/12/01 Referring Provider (PT): Truitt Merle, MD   Encounter Date: 09/17/2018  PT End of Session - 09/17/18 1523    Visit Number  3    Date for PT Re-Evaluation  10/31/18    Authorization Type  Med pay    Authorization Time Period  09/05/18-10/31/18    PT Start Time  1515    PT Stop Time  1600    PT Time Calculation (min)  45 min    Activity Tolerance  Patient limited by fatigue    Behavior During Therapy  [facility] for tasks assessed/performed       Past Medical History:  Diagnosis Date  . Bone cancer [facility])    "being tx'd by Dr. Burr Medico @ [facility]" (10/09/2017)  . Chronic kidney disease (CKD), stage III (moderate) (HCC)   . Depression    Risperdal "twice/day" (09/23/2016)  . Glaucoma   . Hiatal hernia   . Hypertension   . Intractable back pain    /notes 09/23/2016  . Obesity   . Osteoarthritis    "all over" (10/09/2017)  . Osteoporosis    vertebral fracture in 2012  . Peripheral vascular disease ([place])   . Upper GI bleed 04/2004   secondary to Mallory-Weiss tear/notes 12/28/2010  . Vertigo     Past Surgical History:  Procedure Laterality Date  . ABDOMINAL HYSTERECTOMY  1974   partial  . APPENDECTOMY     as a child/notes 12/28/2010  . BACK SURGERY    . BREAST BIOPSY Left 1999   Archie Endo 12/28/2010  . CATARACT EXTRACTION W/ INTRAOCULAR LENS  IMPLANT, BILATERAL Bilateral 2000s  . ESOPHAGOGASTRODUODENOSCOPY  04/2004   Archie Endo 12/28/2010  . FIXATION KYPHOPLASTY THORACIC SPINE  05/2011   T10/notes 05/25/2011  . FRACTURE SURGERY    . HIP ARTHROPLASTY Right 01/19/2017   Procedure: RIGHT HIP HEMIARTHROPLASTY;  Surgeon: Renette Butters, MD;  Location: [place];  Service: Orthopedics;  Laterality: Right;  . LAPAROSCOPIC  CHOLECYSTECTOMY  10/2007   Archie Endo 12/14/2010  . LOWER EXTREMITY ANGIOGRAPHY N/A 10/09/2017   Procedure: LOWER EXTREMITY ANGIOGRAPHY;  Surgeon: Lorretta Harp, MD;  Location: [place] [facility];  Service: Cardiovascular;  Laterality: N/A;  bilateral  . LOWER EXTREMITY ANGIOGRAPHY  11/21/2017   Procedure: Lower Extremity Angiography;  Surgeon: Adrian Prows, MD;  Location: [place] [facility];  Service: Cardiovascular;;  . PERIPHERAL VASCULAR BALLOON ANGIOPLASTY  10/09/2017   Attempted PTA Archie Endo 10/09/2017  . PERIPHERAL VASCULAR BALLOON ANGIOPLASTY  10/09/2017   Procedure: PERIPHERAL VASCULAR BALLOON ANGIOPLASTY;  Surgeon: Lorretta Harp, MD;  Location: [place] [facility];  Service: Cardiovascular;;  Attempted PTA     There were no vitals filed for this visit.  Subjective Assessment - 09/17/18 1515    Subjective  Pt states today feels like a better day.  She had another doctor appointment earlier today too.    Pertinent History  complex medical history including mulitple myeloma    Limitations  Standing;Walking;House hold activities;Lifting    How long can you stand comfortably?  30 min    How long can you walk comfortably?  avoids walking, only goes as far as she needs to    Patient Stated Goals  walk greater distances    Currently in Pain?  Yes  Pain Score  7     Pain Location  Hip    Pain Orientation  Right    Pain Onset  More than a month ago                       [facility] Adult PT Treatment/Exercise - 09/17/18 0001      Ambulation/Gait   Ambulation/Gait  Yes    Ambulation Distance (Feet)  120 Feet    Assistive device  Rollator    Ambulation Surface  Level;Indoor    Gait Comments  flexed trunk posture      Neuro Re-ed    Neuro Re-ed Details   weight shifting in stagger stance with manual facilitation x 15 each position   with rollator     Exercises   Exercises  Knee/Hip      Lumbar Exercises: Aerobic   Nustep  L1 x 6', seat 6, arms 10   PT present to monitor  fatigue     Knee/Hip Exercises: Seated   Long Arc Quad  Strengthening;Both;2 sets;10 reps    Long Arc Quad Weight  1 lbs.   for second set   Ball Squeeze  10x 2 sec holds, 2 sets    Clamshell with TheraBand  Red   2x15   Marching  Strengthening;Both;20 reps;2 sets    Sit to Sand  1 set;5 reps;with UE support      Shoulder Exercises: Seated   Row  Strengthening;Both;Theraband    Theraband Level (Shoulder Row)  Level 1 (Yellow)    Row Limitations  2x10               PT Short Term Goals - 09/17/18 1524      PT SHORT TERM GOAL #1   Title  I in initial HEP targeting UE/LE strength for posture and improved strength for gait and transfers.    Status  On-going      PT SHORT TERM GOAL #2   Title  Pt will be able to perform TUG test to estabilish baseline for a LTG.    Baseline  38 min    Status  Achieved        PT Long Term Goals - 09/17/18 1526      PT LONG TERM GOAL #5   Title  Pt will achieve TUG time of </= 35 with rollator demonstrating reduction in fall risk and improved mobility with sit to stand and gait.    Time  6    Period  Weeks    Status  New    Target Date  10/29/18            Plan - 09/17/18 1603    Clinical Impression Statement  Pt ambulated 120 feet with rollator today with fatigue rating of 8/10 after ambulating.  She reported she lost her papers so has forgotten to do HEP other than walking throughout house a few times a day. PT provided another set of HEP handouts.  PT added UE seated row to session today to address more upright posture strength and worked on sit to stand today.  If Pt reports compliance with initial HEP consider adding more LE and UE tband to HEP next visit.  Pt will continue to benefit from skilled PT for strength, gait and functional mobility along POC.    Rehab Potential  Good    PT Frequency  2x / week    PT Duration  8 weeks    PT  Treatment/Interventions  ADLs/Self Care Home Management;Moist Heat;Therapeutic  exercise;Therapeutic activities;Functional mobility training;Balance training;Neuromuscular re-education;Patient/family education;Manual techniques;Passive range of motion;Taping;Electrical Stimulation    PT Next Visit Plan  update HEP if compliant with initial and can follow, continue progressing gait endurance, UE postural strength, LE strength    PT Home Exercise Plan  Access Code: [email or web address]    Consulted and Agree with Plan of Care  Patient       Patient will benefit from skilled therapeutic intervention in order to improve the following deficits and impairments:  Abnormal gait, Improper body mechanics, Decreased mobility, Postural dysfunction, Decreased activity tolerance, Decreased endurance, Decreased range of motion, Decreased strength, Decreased balance, Difficulty walking, Impaired flexibility  Visit Diagnosis: Muscle weakness (generalized)  Other abnormalities of gait and mobility     Problem List Patient Active Problem List   Diagnosis Date Noted  . Critical lower limb ischemia 10/04/2017  . Advance care planning   . Goals of care, counseling/discussion   . Palliative care by specialist   . CKD (chronic kidney disease), stage III ([place]) 01/18/2017  . Hip fracture, pathological ([place]) 01/18/2017  . Pathologic hip fracture ([place]) 01/18/2017  . DNR (do not resuscitate) discussion 11/01/2016  . Multiple myeloma ([place]) 10/31/2016  . Acute encephalopathy 10/18/2016  . Hypercalcemia 10/18/2016  . Fall   . Anemia in neoplastic disease 09/23/2016  . Sepsis ([place]) 09/23/2016  . Back pain 09/23/2016  . Intractable back pain 06/20/2016  . Other malaise and fatigue 11/26/2013  . Vertigo   . Obesity   . Arthritis of knee, left 07/09/2013  . Need for prophylactic vaccination and inoculation against influenza 07/09/2013  . Osteoporosis with pathological fracture 01/01/2013  . HLD (hyperlipidemia) 01/01/2013  . HTN (hypertension) 01/01/2013  . Chronic back pain 01/01/2013  .  Vertebral compression fracture ([place]) 01/01/2013  . Seasonal allergic rhinitis 01/01/2013    Baruch Merl, PT 09/17/18 4:06 PM   [facility] [address] [place], [place], [address] Phone: [phone]   Fax:  [phone]  Name: Kristin [name] MRN: [id number] Date of Birth: May 27, 1931

## 2018-09-19 ENCOUNTER — Ambulatory Visit: Payer: Medicare Other | Admitting: Physical Therapy

## 2018-09-19 ENCOUNTER — Encounter: Payer: Self-pay | Admitting: Physical Therapy

## 2018-09-19 ENCOUNTER — Telehealth: Payer: Self-pay

## 2018-09-19 DIAGNOSIS — R2689 Other abnormalities of gait and mobility: Secondary | ICD-10-CM

## 2018-09-19 DIAGNOSIS — M6281 Muscle weakness (generalized): Secondary | ICD-10-CM | POA: Diagnosis not present

## 2018-09-19 NOTE — Telephone Encounter (Signed)
Faxed script for [employer] and [employer] authorization to [employer], sent to HIM for scanning to chart.

## 2018-09-19 NOTE — Patient Instructions (Signed)
Access Code: [email or web address]  URL: [email or web address]  Date: 09/19/2018  Prepared by: Venetia Night Toyoko Silos   Exercises  Seated Heel Toe Raises - 10 reps - 3 sets - 1x daily - 7x weekly  Seated Long Arc Quad - 10 reps - 3 sets - 1x daily - 7x weekly  Proper Sit to Stand Technique - 5 reps - 2 sets - 1x daily - 7x weekly  Seated March - 20 reps - 2 sets - 1x daily - 7x weekly  Seated Shoulder Row with Anchored Resistance - 15 reps - 2 sets - 1x daily - 7x weekly

## 2018-09-19 NOTE — Therapy (Signed)
[facility] [address] [place], [place], [address] Phone: [phone]   Fax:  [phone]  Physical Therapy Treatment  Patient Details  Name: Kristin [name] MRN: [id number] Date of Birth: 05/13/1931 Referring Provider (PT): Truitt Merle, MD   Encounter Date: 09/19/2018  PT End of Session - 09/19/18 1445    Visit Number  4    Date for PT Re-Evaluation  10/31/18    Authorization Type  Med pay    Authorization Time Period  09/05/18-10/31/18    PT Start Time  1445    PT Stop Time  1524    PT Time Calculation (min)  39 min    Activity Tolerance  Patient limited by fatigue    Behavior During Therapy  [facility] for tasks assessed/performed       Past Medical History:  Diagnosis Date  . Bone cancer [facility])    "being tx'd by Dr. Burr Medico @ [facility]" (10/09/2017)  . Chronic kidney disease (CKD), stage III (moderate) (HCC)   . Depression    Risperdal "twice/day" (09/23/2016)  . Glaucoma   . Hiatal hernia   . Hypertension   . Intractable back pain    /notes 09/23/2016  . Obesity   . Osteoarthritis    "all over" (10/09/2017)  . Osteoporosis    vertebral fracture in 2012  . Peripheral vascular disease ([place])   . Upper GI bleed 04/2004   secondary to Mallory-Weiss tear/notes 12/28/2010  . Vertigo     Past Surgical History:  Procedure Laterality Date  . ABDOMINAL HYSTERECTOMY  1974   partial  . APPENDECTOMY     as a child/notes 12/28/2010  . BACK SURGERY    . BREAST BIOPSY Left 1999   Archie Endo 12/28/2010  . CATARACT EXTRACTION W/ INTRAOCULAR LENS  IMPLANT, BILATERAL Bilateral 2000s  . ESOPHAGOGASTRODUODENOSCOPY  04/2004   Archie Endo 12/28/2010  . FIXATION KYPHOPLASTY THORACIC SPINE  05/2011   T10/notes 05/25/2011  . FRACTURE SURGERY    . HIP ARTHROPLASTY Right 01/19/2017   Procedure: RIGHT HIP HEMIARTHROPLASTY;  Surgeon: Renette Butters, MD;  Location: [place];  Service: Orthopedics;  Laterality: Right;  . LAPAROSCOPIC  CHOLECYSTECTOMY  10/2007   Archie Endo 12/14/2010  . LOWER EXTREMITY ANGIOGRAPHY N/A 10/09/2017   Procedure: LOWER EXTREMITY ANGIOGRAPHY;  Surgeon: Lorretta Harp, MD;  Location: [place] [facility];  Service: Cardiovascular;  Laterality: N/A;  bilateral  . LOWER EXTREMITY ANGIOGRAPHY  11/21/2017   Procedure: Lower Extremity Angiography;  Surgeon: Adrian Prows, MD;  Location: [place] [facility];  Service: Cardiovascular;;  . PERIPHERAL VASCULAR BALLOON ANGIOPLASTY  10/09/2017   Attempted PTA Archie Endo 10/09/2017  . PERIPHERAL VASCULAR BALLOON ANGIOPLASTY  10/09/2017   Procedure: PERIPHERAL VASCULAR BALLOON ANGIOPLASTY;  Surgeon: Lorretta Harp, MD;  Location: [place] [facility];  Service: Cardiovascular;;  Attempted PTA     There were no vitals filed for this visit.  Subjective Assessment - 09/19/18 1444    Subjective  Pt states she did fine after last visit.  Had a little pain this AM but nothing to be alarmed over.  It's gone now.  "I have good energy today."    Pertinent History  complex medical history including mulitple myeloma    Limitations  Standing;Walking;House hold activities;Lifting    How long can you stand comfortably?  30 min    How long can you walk comfortably?  avoids walking, only goes as far as she needs to    Patient Stated Goals  walk greater distances    Currently in Pain?  No/denies                       [facility] Adult PT Treatment/Exercise - 09/19/18 0001      Ambulation/Gait   Ambulation/Gait  Yes    Ambulation Distance (Feet)  240 Feet   400 second round, fatigue rating 6/10   Assistive device  Rollator    Gait Pattern  Decreased step length - right;Decreased step length - left    Ambulation Surface  Level;Indoor    Gait Comments  flexed trunk      Exercises   Exercises  Shoulder;Knee/Hip      Lumbar Exercises: Aerobic   Nustep  L1 x 6', seat 6, arms 10   PT present to monitor fatigue     Knee/Hip Exercises: Seated   Long Arc Quad   Strengthening;2 sets;10 reps    Long Arc Quad Weight  --   1.5   Ball Squeeze  2x15    Other Seated Knee/Hip Exercises  toe raises, feet on black pad, 20x    Marching  Strengthening;20 reps;Weights;2 sets    [employer]  2 lbs.      Shoulder Exercises: Seated   Retraction  Both;15 reps    Retraction Limitations  PT provided TC     Row  Strengthening;Both;Theraband    Theraband Level (Shoulder Row)  Level 2 (Red)    Row Limitations  2x15 seated             PT Education - 09/19/18 1447    Education Details  Access Code: [email or web address] , added seated row with tband and seated marching    Person(s) Educated  Patient    Methods  Explanation;Demonstration;Verbal cues;Handout    Comprehension  Verbalized understanding;Returned demonstration       PT Short Term Goals - 09/19/18 1445      PT SHORT TERM GOAL #3   Title  Pt will report at least 20% greater endurance with gait for household distances.    Status  On-going        PT Long Term Goals - 09/17/18 1526      PT LONG TERM GOAL #5   Title  Pt will achieve TUG time of </= 35 with rollator demonstrating reduction in fall risk and improved mobility with sit to stand and gait.    Time  6    Period  Weeks    Status  New    Target Date  10/29/18            Plan - 09/19/18 1524    Clinical Impression Statement  Pt ambulated over 2 trials today for 240' and 400' with rollator today.  She rated fatigue as 6/10 after 400'.  She is able to stand more erect during gait with consistent cueing by PT.  She arrived without pain today and was able to complete session without pain as well.  She needs cueing during ther ex to slow down to properly engage targeted muscle groups.  She will continue to benefit from skilled PT along POC for gait, strength and functional endurance along POC.    PT Frequency  2x / week    PT Duration  8 weeks    PT Treatment/Interventions  ADLs/Self Care Home Management;Moist Heat;Therapeutic  exercise;Therapeutic activities;Functional mobility training;Balance training;Neuromuscular re-education;Patient/family education;Manual techniques;Passive range of motion;Taping;Electrical Stimulation    PT Next Visit Plan  f/u on HEP, continue gait, LE  strength, add some standing ther ex as tolerated    PT Home Exercise Plan  Access Code: [email or web address]    Consulted and Agree with Plan of Care  Patient       Patient will benefit from skilled therapeutic intervention in order to improve the following deficits and impairments:  Abnormal gait, Improper body mechanics, Decreased mobility, Postural dysfunction, Decreased activity tolerance, Decreased endurance, Decreased range of motion, Decreased strength, Decreased balance, Difficulty walking, Impaired flexibility  Visit Diagnosis: Muscle weakness (generalized)  Other abnormalities of gait and mobility     Problem List Patient Active Problem List   Diagnosis Date Noted  . Critical lower limb ischemia 10/04/2017  . Advance care planning   . Goals of care, counseling/discussion   . Palliative care by specialist   . CKD (chronic kidney disease), stage III ([place]) 01/18/2017  . Hip fracture, pathological ([place]) 01/18/2017  . Pathologic hip fracture ([place]) 01/18/2017  . DNR (do not resuscitate) discussion 11/01/2016  . Multiple myeloma ([place]) 10/31/2016  . Acute encephalopathy 10/18/2016  . Hypercalcemia 10/18/2016  . Fall   . Anemia in neoplastic disease 09/23/2016  . Sepsis ([place]) 09/23/2016  . Back pain 09/23/2016  . Intractable back pain 06/20/2016  . Other malaise and fatigue 11/26/2013  . Vertigo   . Obesity   . Arthritis of knee, left 07/09/2013  . Need for prophylactic vaccination and inoculation against influenza 07/09/2013  . Osteoporosis with pathological fracture 01/01/2013  . HLD (hyperlipidemia) 01/01/2013  . HTN (hypertension) 01/01/2013  . Chronic back pain 01/01/2013  . Vertebral compression fracture ([place]) 01/01/2013  .  Seasonal allergic rhinitis 01/01/2013   Baruch Merl, PT 09/19/18 3:26 PM   [facility] [address], [place], [place], [address] Phone: [phone]   Fax:  [phone]  Name: NOELENE GANG MRN: [id number] Date of Birth: 09-25-1930

## 2018-09-24 ENCOUNTER — Encounter: Payer: Self-pay | Admitting: Physical Therapy

## 2018-09-24 ENCOUNTER — Ambulatory Visit: Payer: Medicare Other | Admitting: Physical Therapy

## 2018-09-24 DIAGNOSIS — M6281 Muscle weakness (generalized): Secondary | ICD-10-CM | POA: Diagnosis not present

## 2018-09-24 DIAGNOSIS — R2689 Other abnormalities of gait and mobility: Secondary | ICD-10-CM

## 2018-09-24 NOTE — Therapy (Signed)
[facility] [address], [place], [place], [address] Phone: [phone]   Fax:  [phone]  Physical Therapy Treatment  Patient Details  Name: Kristin [name] MRN: [id number] Date of Birth: [birth year] Referring Provider (PT): Truitt Merle, MD   Encounter Date: 09/24/2018  PT End of Session - 09/24/18 1542    Visit Number  5    Date for PT Re-Evaluation  10/31/18    Authorization Type  Med pay    Authorization Time Period  09/05/18-10/31/18    PT Start Time  1537   a little late   PT Stop Time  1603    PT Time Calculation (min)  26 min    Activity Tolerance  Patient limited by fatigue   Pt just not interested in much exercise today   Behavior During Therapy  [facility] for tasks assessed/performed       Past Medical History:  Diagnosis Date  . Bone cancer [facility])    "being tx'd by Dr. Burr Medico @ [facility]" (10/09/2017)  . Chronic kidney disease (CKD), stage III (moderate) (HCC)   . Depression    Risperdal "twice/day" (09/23/2016)  . Glaucoma   . Hiatal hernia   . Hypertension   . Intractable back pain    /notes 09/23/2016  . Obesity   . Osteoarthritis    "all over" (10/09/2017)  . Osteoporosis    vertebral fracture in 2012  . Peripheral vascular disease ([place])   . Upper GI bleed 04/2004   secondary to Mallory-Weiss tear/notes 12/28/2010  . Vertigo     Past Surgical History:  Procedure Laterality Date  . ABDOMINAL HYSTERECTOMY  1974   partial  . APPENDECTOMY     as a child/notes 12/28/2010  . BACK SURGERY    . BREAST BIOPSY Left 1999   Archie Endo 12/28/2010  . CATARACT EXTRACTION W/ INTRAOCULAR LENS  IMPLANT, BILATERAL Bilateral 2000s  . ESOPHAGOGASTRODUODENOSCOPY  04/2004   Archie Endo 12/28/2010  . FIXATION KYPHOPLASTY THORACIC SPINE  05/2011   T10/notes 05/25/2011  . FRACTURE SURGERY    . HIP ARTHROPLASTY Right 01/19/2017   Procedure: RIGHT HIP HEMIARTHROPLASTY;  Surgeon: Renette Butters, MD;  Location: [place];  Service:  Orthopedics;  Laterality: Right;  . LAPAROSCOPIC CHOLECYSTECTOMY  10/2007   Archie Endo 12/14/2010  . LOWER EXTREMITY ANGIOGRAPHY N/A 10/09/2017   Procedure: LOWER EXTREMITY ANGIOGRAPHY;  Surgeon: Lorretta Harp, MD;  Location: [place] [facility];  Service: Cardiovascular;  Laterality: N/A;  bilateral  . LOWER EXTREMITY ANGIOGRAPHY  11/21/2017   Procedure: Lower Extremity Angiography;  Surgeon: Adrian Prows, MD;  Location: [place] [facility];  Service: Cardiovascular;;  . PERIPHERAL VASCULAR BALLOON ANGIOPLASTY  10/09/2017   Attempted PTA Archie Endo 10/09/2017  . PERIPHERAL VASCULAR BALLOON ANGIOPLASTY  10/09/2017   Procedure: PERIPHERAL VASCULAR BALLOON ANGIOPLASTY;  Surgeon: Lorretta Harp, MD;  Location: [place] [facility];  Service: Cardiovascular;;  Attempted PTA     There were no vitals filed for this visit.  Subjective Assessment - 09/24/18 1543    Subjective  I did not feel like coming today. I just wanted to rest today but my family made me.     Pertinent History  complex medical history including mulitple myeloma    Currently in Pain?  No/denies    Multiple Pain Sites  No                       [facility] Adult PT Treatment/Exercise - 09/24/18 0001  Lumbar Exercises: Aerobic   Nustep  L1 x 6', seat 6, arms 10   PTA present to monitor fatigue     Knee/Hip Exercises: Standing   Other Standing Knee Exercises  walking for endurance with RW 240 feet at one time with SBA   VC for posture     Knee/Hip Exercises: Seated   Long Arc Quad  Strengthening;Both;1 set;15 reps    Long Arc Quad Weight  2 lbs.    Ball Squeeze  20x    Other Seated Knee/Hip Exercises  toe raises, feet on black pad, 20x    Marching  Strengthening;20 reps;Weights;2 sets    Marching Weights  2 lbs.    Sit to Sand  5 reps;with UE support               PT Short Term Goals - 09/19/18 1445      PT SHORT TERM GOAL #3   Title  Pt will report at least 20% greater endurance with gait for household  distances.    Status  On-going        PT Long Term Goals - 09/17/18 1526      PT LONG TERM GOAL #5   Title  Pt will achieve TUG time of </= 35 with rollator demonstrating reduction in fall risk and improved mobility with sit to stand and gait.    Time  6    Period  Weeks    Status  New    Target Date  10/29/18            Plan - 09/24/18 1604    Clinical Impression Statement  Pt not very interested in exercise today. She admittedly did not want to come to PT today but her family encouraged her to go. No pain. Min cues to slow her speed with exercises and was able to walk 240 feet at one time without signs of fatigue more of a feeling she prefered to rest versus do exercises today.     Rehab Potential  Good    PT Frequency  2x / week    PT Duration  8 weeks    PT Treatment/Interventions  ADLs/Self Care Home Management;Moist Heat;Therapeutic exercise;Therapeutic activities;Functional mobility training;Balance training;Neuromuscular re-education;Patient/family education;Manual techniques;Passive range of motion;Taping;Electrical Stimulation    PT Next Visit Plan  f/u on HEP, continue gait, LE strength, add some standing ther ex as tolerated    PT Home Exercise Plan  Access Code: [email or web address]    Consulted and Agree with Plan of Care  Patient       Patient will benefit from skilled therapeutic intervention in order to improve the following deficits and impairments:  Abnormal gait, Improper body mechanics, Decreased mobility, Postural dysfunction, Decreased activity tolerance, Decreased endurance, Decreased range of motion, Decreased strength, Decreased balance, Difficulty walking, Impaired flexibility  Visit Diagnosis: Muscle weakness (generalized)  Other abnormalities of gait and mobility     Problem List Patient Active Problem List   Diagnosis Date Noted  . Critical lower limb ischemia 10/04/2017  . Advance care planning   . Goals of care, counseling/discussion   .  Palliative care by specialist   . CKD (chronic kidney disease), stage III ([place]) 01/18/2017  . Hip fracture, pathological ([place]) 01/18/2017  . Pathologic hip fracture ([place]) 01/18/2017  . DNR (do not resuscitate) discussion 11/01/2016  . Multiple myeloma ([name]) 10/31/2016  . Acute encephalopathy 10/18/2016  . Hypercalcemia 10/18/2016  . Fall   . Anemia in neoplastic disease 09/23/2016  . Sepsis (  [place]) 09/23/2016  . Back pain 09/23/2016  . Intractable back pain 06/20/2016  . Other malaise and fatigue 11/26/2013  . Vertigo   . Obesity   . Arthritis of knee, left 07/09/2013  . Need for prophylactic vaccination and inoculation against influenza 07/09/2013  . Osteoporosis with pathological fracture 01/01/2013  . HLD (hyperlipidemia) 01/01/2013  . HTN (hypertension) 01/01/2013  . Chronic back pain 01/01/2013  . Vertebral compression fracture ([place]) 01/01/2013  . Seasonal allergic rhinitis 01/01/2013    Zackeriah Kissler, PTA 09/24/2018, 4:07 PM  [facility] [address] [place], [place], [address] Phone: [phone]   Fax:  [phone]  Name: Kristin [name] MRN: [id number] Date of Birth: [birth year]

## 2018-09-26 ENCOUNTER — Ambulatory Visit: Payer: Medicare Other | Admitting: Physical Therapy

## 2018-10-01 ENCOUNTER — Ambulatory Visit: Payer: Medicare Other | Admitting: Physical Therapy

## 2018-10-03 ENCOUNTER — Ambulatory Visit: Payer: Medicare Other | Admitting: Physical Therapy

## 2018-10-03 ENCOUNTER — Encounter: Payer: Self-pay | Admitting: Physical Therapy

## 2018-10-03 DIAGNOSIS — M6281 Muscle weakness (generalized): Secondary | ICD-10-CM

## 2018-10-03 DIAGNOSIS — R2689 Other abnormalities of gait and mobility: Secondary | ICD-10-CM

## 2018-10-03 NOTE — Therapy (Signed)
[facility] [address], [place], [place], [address] Phone: [phone]   Fax:  [phone]  Physical Therapy Treatment  Patient Details  Name: Kristin [name] MRN: [id number] Date of Birth: 06-Jan-1931 Referring Provider (PT): Truitt Merle, MD   Encounter Date: 10/03/2018  PT End of Session - 10/03/18 1536    Visit Number  6    Date for PT Re-Evaluation  10/31/18    Authorization Type  Med pay    Authorization Time Period  09/05/18-10/31/18    Authorization - Visit Number  14    Authorization - Number of Visits  20    PT Start Time  1536    PT Stop Time  1610    PT Time Calculation (min)  34 min    Activity Tolerance  Patient limited by pain;Patient limited by fatigue    Behavior During Therapy  [facility] for tasks assessed/performed       Past Medical History:  Diagnosis Date  . Bone cancer [facility])    "being tx'd by Dr. Burr Medico @ [facility]" (10/09/2017)  . Chronic kidney disease (CKD), stage III (moderate) (HCC)   . Depression    Risperdal "twice/day" (09/23/2016)  . Glaucoma   . Hiatal hernia   . Hypertension   . Intractable back pain    /notes 09/23/2016  . Obesity   . Osteoarthritis    "all over" (10/09/2017)  . Osteoporosis    vertebral fracture in 2012  . Peripheral vascular disease ([place])   . Upper GI bleed 04/2004   secondary to Mallory-Weiss tear/notes 12/28/2010  . Vertigo     Past Surgical History:  Procedure Laterality Date  . ABDOMINAL HYSTERECTOMY  1974   partial  . APPENDECTOMY     as a child/notes 12/28/2010  . BACK SURGERY    . BREAST BIOPSY Left 1999   Archie Endo 12/28/2010  . CATARACT EXTRACTION W/ INTRAOCULAR LENS  IMPLANT, BILATERAL Bilateral 2000s  . ESOPHAGOGASTRODUODENOSCOPY  04/2004   Archie Endo 12/28/2010  . FIXATION KYPHOPLASTY THORACIC SPINE  05/2011   T10/notes 05/25/2011  . FRACTURE SURGERY    . HIP ARTHROPLASTY Right 01/19/2017   Procedure: RIGHT HIP HEMIARTHROPLASTY;  Surgeon: Renette Butters, MD;  Location: [place];  Service: Orthopedics;  Laterality: Right;  . LAPAROSCOPIC CHOLECYSTECTOMY  10/2007   Archie Endo 12/14/2010  . LOWER EXTREMITY ANGIOGRAPHY N/A 10/09/2017   Procedure: LOWER EXTREMITY ANGIOGRAPHY;  Surgeon: Lorretta Harp, MD;  Location: [place] [facility];  Service: Cardiovascular;  Laterality: N/A;  bilateral  . LOWER EXTREMITY ANGIOGRAPHY  11/21/2017   Procedure: Lower Extremity Angiography;  Surgeon: Adrian Prows, MD;  Location: [place] [facility];  Service: Cardiovascular;;  . PERIPHERAL VASCULAR BALLOON ANGIOPLASTY  10/09/2017   Attempted Kristin [name] Archie Endo 10/09/2017  . PERIPHERAL VASCULAR BALLOON ANGIOPLASTY  10/09/2017   Procedure: PERIPHERAL VASCULAR BALLOON ANGIOPLASTY;  Surgeon: Lorretta Harp, MD;  Location: [place] [facility];  Service: Cardiovascular;;  Attempted Kristin [name]     There were no vitals filed for this visit.  Subjective Assessment - 10/03/18 1540    Subjective  My back is hurting me today, I did not want to come today.     Pertinent History  complex medical history including mulitple myeloma    Currently in Pain?  Yes    Pain Score  7     Pain Location  Back    Pain Orientation  Lower    Pain Descriptors / Indicators  Sore  Aggravating Factors   Good days bad days    Pain Relieving Factors  Unsure    Multiple Pain Sites  No                       [facility] Adult PT Treatment/Exercise - 10/03/18 0001      Lumbar Exercises: Aerobic   Nustep  L1 x 7 min   Kristin [name] present to monitor pt's status: MHP lumbar     Knee/Hip Exercises: Seated   Long Arc Quad  Strengthening;Both;2 sets;15 reps    Long Arc Quad Weight  2 lbs.    Clamshell with TheraBand  Red   20x   Marching  Strengthening;Both;2 sets;10 reps;Weights    Marching Weights  2 lbs.    Sit to Sand  --   5x with UE 17 sec: no UE 17 sec              PT Short Term Goals - 09/19/18 1445      PT SHORT TERM GOAL #3   Title  Pt will report at least 20% greater endurance  with gait for household distances.    Status  On-going        PT Long Term Goals - 10/03/18 1554      PT LONG TERM GOAL #2   Title  Pt will perform 5x sit to stand in </= 28 sec demonstrating reduction in fall risk.    Time  8    Period  Weeks    Status  Achieved   17 sec no UE           Plan - 10/03/18 1540    Clinical Impression Statement  Pt was not able to seen last session due to arriving for her appoinment too late. Today she again presents fatigued, complaints of low back pain and a low interest in exercise. Pt is compliant in doing what was asked of her today. Kristin [name] put MHP to her low back  during seated exercises for her low back pain. This seemed to help. pt was able to perform sit to stand in 17 sec today meeting LTG.      Rehab Potential  Good    PT Frequency  2x / week    PT Duration  8 weeks    PT Treatment/Interventions  ADLs/Self Care Home Management;Moist Heat;Therapeutic exercise;Therapeutic activities;Functional mobility training;Balance training;Neuromuscular re-education;Patient/family education;Manual techniques;Passive range of motion;Taping;Electrical Stimulation    PT Next Visit Plan  Low level exercise, LE strength    PT Home Exercise Plan  Access Code: [email or web address]    Consulted and Agree with Plan of Care  Patient       Patient will benefit from skilled therapeutic intervention in order to improve the following deficits and impairments:  Abnormal gait, Improper body mechanics, Decreased mobility, Postural dysfunction, Decreased activity tolerance, Decreased endurance, Decreased range of motion, Decreased strength, Decreased balance, Difficulty walking, Impaired flexibility  Visit Diagnosis: Muscle weakness (generalized)  Other abnormalities of gait and mobility     Problem List Patient Active Problem List   Diagnosis Date Noted  . Critical lower limb ischemia 10/04/2017  . Advance care planning   . Goals of care, counseling/discussion   .  Palliative care by specialist   . CKD (chronic kidney disease), stage III ([place]) 01/18/2017  . Hip fracture, pathological ([place]) 01/18/2017  . Pathologic hip fracture ([place]) 01/18/2017  . DNR (do not resuscitate) discussion 11/01/2016  . Multiple myeloma ([place]) 10/31/2016  .  Acute encephalopathy 10/18/2016  . Hypercalcemia 10/18/2016  . Fall   . Anemia in neoplastic disease 09/23/2016  . Sepsis ([place]) 09/23/2016  . Back pain 09/23/2016  . Intractable back pain 06/20/2016  . Other malaise and fatigue 11/26/2013  . Vertigo   . Obesity   . Arthritis of knee, left 07/09/2013  . Need for prophylactic vaccination and inoculation against influenza 07/09/2013  . Osteoporosis with pathological fracture 01/01/2013  . HLD (hyperlipidemia) 01/01/2013  . HTN (hypertension) 01/01/2013  . Chronic back pain 01/01/2013  . Vertebral compression fracture ([place]) 01/01/2013  . Seasonal allergic rhinitis 01/01/2013    Kristin [name], Kristin [name] 10/03/2018, 4:12 PM  [facility] [address], [place], [place], [address] Phone: [phone]   Fax:  [phone]  Name: Kristin [name] MRN: [id number] Date of Birth: 1931-05-25

## 2018-10-04 ENCOUNTER — Other Ambulatory Visit: Payer: Self-pay | Admitting: Hematology

## 2018-10-04 DIAGNOSIS — C9000 Multiple myeloma not having achieved remission: Secondary | ICD-10-CM

## 2018-10-04 MED ORDER — MORPHINE SULFATE ER 30 MG PO TBCR: 30.0000 mg | EXTENDED_RELEASE_TABLET | Freq: Two times a day (BID) | ORAL | 0 refills | Status: DC

## 2018-10-08 ENCOUNTER — Encounter: Payer: Self-pay | Admitting: Physical Therapy

## 2018-10-08 ENCOUNTER — Ambulatory Visit: Payer: Medicare Other | Admitting: Physical Therapy

## 2018-10-08 DIAGNOSIS — M6281 Muscle weakness (generalized): Secondary | ICD-10-CM

## 2018-10-08 DIAGNOSIS — R2689 Other abnormalities of gait and mobility: Secondary | ICD-10-CM

## 2018-10-08 NOTE — Therapy (Signed)
[facility] [address], [place], [place], [address] Phone: [phone]   Fax:  [phone]  Physical Therapy Treatment  Patient Details  Name: Kristin [name] MRN: [id number] Date of Birth: 1931-01-05 Referring Provider (PT): Truitt Merle, MD   Encounter Date: 10/08/2018  PT End of Session - 10/08/18 1603    Visit Number  7    Date for PT Re-Evaluation  10/31/18    Authorization Type  Med pay    Authorization Time Period  09/05/18-10/31/18    PT Start Time  1533    PT Stop Time  1611    PT Time Calculation (min)  38 min       Past Medical History:  Diagnosis Date  . Bone cancer [facility])    "being tx'd by Dr. Burr Medico @ [facility]" (10/09/2017)  . Chronic kidney disease (CKD), stage III (moderate) (HCC)   . Depression    Risperdal "twice/day" (09/23/2016)  . Glaucoma   . Hiatal hernia   . Hypertension   . Intractable back pain    /notes 09/23/2016  . Obesity   . Osteoarthritis    "all over" (10/09/2017)  . Osteoporosis    vertebral fracture in 2012  . Peripheral vascular disease ([place])   . Upper GI bleed 04/2004   secondary to Mallory-Weiss tear/notes 12/28/2010  . Vertigo     Past Surgical History:  Procedure Laterality Date  . ABDOMINAL HYSTERECTOMY  1974   partial  . APPENDECTOMY     as a child/notes 12/28/2010  . BACK SURGERY    . BREAST BIOPSY Left 1999   Archie Endo 12/28/2010  . CATARACT EXTRACTION W/ INTRAOCULAR LENS  IMPLANT, BILATERAL Bilateral 2000s  . ESOPHAGOGASTRODUODENOSCOPY  04/2004   Archie Endo 12/28/2010  . FIXATION KYPHOPLASTY THORACIC SPINE  05/2011   T10/notes 05/25/2011  . FRACTURE SURGERY    . HIP ARTHROPLASTY Right 01/19/2017   Procedure: RIGHT HIP HEMIARTHROPLASTY;  Surgeon: Renette Butters, MD;  Location: [place];  Service: Orthopedics;  Laterality: Right;  . LAPAROSCOPIC CHOLECYSTECTOMY  10/2007   Archie Endo 12/14/2010  . LOWER EXTREMITY ANGIOGRAPHY N/A 10/09/2017   Procedure: LOWER EXTREMITY  ANGIOGRAPHY;  Surgeon: Lorretta Harp, MD;  Location: [place] [facility];  Service: Cardiovascular;  Laterality: N/A;  bilateral  . LOWER EXTREMITY ANGIOGRAPHY  11/21/2017   Procedure: Lower Extremity Angiography;  Surgeon: Adrian Prows, MD;  Location: [place] [facility];  Service: Cardiovascular;;  . PERIPHERAL VASCULAR BALLOON ANGIOPLASTY  10/09/2017   Attempted PTA Archie Endo 10/09/2017  . PERIPHERAL VASCULAR BALLOON ANGIOPLASTY  10/09/2017   Procedure: PERIPHERAL VASCULAR BALLOON ANGIOPLASTY;  Surgeon: Lorretta Harp, MD;  Location: [name] [facility];  Service: Cardiovascular;;  Attempted PTA     There were no vitals filed for this visit.  Subjective Assessment - 10/08/18 1540    Subjective  I am good today.    Pertinent History  complex medical history including mulitple myeloma    Currently in Pain?  No/denies    Multiple Pain Sites  No                       [facility] Adult PT Treatment/Exercise - 10/08/18 0001      Lumbar Exercises: Aerobic   Nustep  L2  x 8 min   PTA present     Lumbar Exercises: Standing   Heel Raises  20 reps   2 sets of 10     Knee/Hip Exercises: Standing  Hip Abduction  Stengthening;Both;1 set;10 reps;Knee straight    Other Standing Knee Exercises  240 feet ambulating with walker before requesting to sit down      Knee/Hip Exercises: Seated   Long Arc Quad  Strengthening;Both;1 set;20 reps;Weights    Long Arc Quad Weight  2 lbs.    Ball Squeeze  25x    Clamshell with TheraBand  Green   20x   Marching  Strengthening;Both;1 set;20 reps;Weights    [employer]  2 lbs.               PT Short Term Goals - 10/08/18 1553      PT SHORT TERM GOAL #2   Title  Pt will be able to perform TUG test to estabilish baseline for a LTG.    Period  Weeks    Status  Achieved      PT SHORT TERM GOAL #3   Title  Pt will report at least 20% greater endurance with gait for household distances.    Time  4    Period  Weeks    Status   Achieved   25%       PT Long Term Goals - 10/08/18 1554      PT LONG TERM GOAL #2   Title  Pt will perform 5x sit to stand in </= 28 sec demonstrating reduction in fall risk.    Time  8    Period  Weeks    Status  Achieved            Plan - 10/08/18 1539    Clinical Impression Statement  Pt arrives today perkier than she had been ( more energy). She reports no pain. She was able to walk 240 feet before needing to sit down due to fatigue. Pt was able to demonstrate better endurance with her PREs, doing more reps with the same amount resistance.  pt reports walking around the home now she has at least 25% better energy, meeting STG.    Rehab Potential  Good    PT Frequency  2x / week    PT Duration  8 weeks    PT Treatment/Interventions  ADLs/Self Care Home Management;Moist Heat;Therapeutic exercise;Therapeutic activities;Functional mobility training;Balance training;Neuromuscular re-education;Patient/family education;Manual techniques;Passive range of motion;Taping;Electrical Stimulation    PT Next Visit Plan  Low level exercise, LE strength    PT Home Exercise Plan  Access Code: [email or web address]    Consulted and Agree with Plan of Care  Patient       Patient will benefit from skilled therapeutic intervention in order to improve the following deficits and impairments:  Abnormal gait, Improper body mechanics, Decreased mobility, Postural dysfunction, Decreased activity tolerance, Decreased endurance, Decreased range of motion, Decreased strength, Decreased balance, Difficulty walking, Impaired flexibility  Visit Diagnosis: Muscle weakness (generalized)  Other abnormalities of gait and mobility     Problem List Patient Active Problem List   Diagnosis Date Noted  . Critical lower limb ischemia 10/04/2017  . Advance care planning   . Goals of care, counseling/discussion   . Palliative care by specialist   . CKD (chronic kidney disease), stage III ([place]) 01/18/2017  . Hip  fracture, pathological ([place]) 01/18/2017  . Pathologic hip fracture ([place]) 01/18/2017  . DNR (do not resuscitate) discussion 11/01/2016  . Multiple myeloma ([place]) 10/31/2016  . Acute encephalopathy 10/18/2016  . Hypercalcemia 10/18/2016  . Fall   . Anemia in neoplastic disease 09/23/2016  . Sepsis ([place]) 09/23/2016  . Back pain 09/23/2016  .  Intractable back pain 06/20/2016  . Other malaise and fatigue 11/26/2013  . Vertigo   . Obesity   . Arthritis of knee, left 07/09/2013  . Need for prophylactic vaccination and inoculation against influenza 07/09/2013  . Osteoporosis with pathological fracture 01/01/2013  . HLD (hyperlipidemia) 01/01/2013  . HTN (hypertension) 01/01/2013  . Chronic back pain 01/01/2013  . Vertebral compression fracture ([place]) 01/01/2013  . Seasonal allergic rhinitis 01/01/2013    COCHRAN,JENNIFER, PTA 10/08/2018, 4:10 PM  [facility] [address], [place], [place], [address] Phone: [phone]   Fax:  [phone]  Name: Kristin [name] MRN: [id number] Date of Birth: [birth year]

## 2018-10-09 ENCOUNTER — Other Ambulatory Visit: Payer: Self-pay | Admitting: Cardiology

## 2018-10-09 DIAGNOSIS — I70233 Atherosclerosis of native arteries of right leg with ulceration of ankle: Secondary | ICD-10-CM

## 2018-10-10 ENCOUNTER — Ambulatory Visit: Payer: Medicare Other | Admitting: Physical Therapy

## 2018-10-10 ENCOUNTER — Encounter: Payer: Self-pay | Admitting: Physical Therapy

## 2018-10-10 DIAGNOSIS — M6281 Muscle weakness (generalized): Secondary | ICD-10-CM

## 2018-10-10 DIAGNOSIS — R2689 Other abnormalities of gait and mobility: Secondary | ICD-10-CM

## 2018-10-10 NOTE — Therapy (Signed)
[facility] [address] [place], [place], [address] Phone: [phone]   Fax:  [phone]  Physical Therapy Treatment  Patient Details  Name: Kristin [name] MRN: [id number] Date of Birth: 1930-09-19 Referring Provider (PT): Truitt Merle, MD   Encounter Date: 10/10/2018  PT End of Session - 10/10/18 1540    Visit Number  8    Date for PT Re-Evaluation  10/31/18    Authorization Type  Med pay    Authorization Time Period  09/05/18-10/31/18    PT Start Time  1533    PT Stop Time  1611    PT Time Calculation (min)  38 min    Activity Tolerance  Patient tolerated treatment well    Behavior During Therapy  [facility] for tasks assessed/performed       Past Medical History:  Diagnosis Date  . Bone cancer [facility])    "being tx'd by Dr. Burr Medico @ [facility]" (10/09/2017)  . Chronic kidney disease (CKD), stage III (moderate) (HCC)   . Depression    Risperdal "twice/day" (09/23/2016)  . Glaucoma   . Hiatal hernia   . Hypertension   . Intractable back pain    /notes 09/23/2016  . Obesity   . Osteoarthritis    "all over" (10/09/2017)  . Osteoporosis    vertebral fracture in 2012  . Peripheral vascular disease ([place])   . Upper GI bleed 04/2004   secondary to Mallory-Weiss tear/notes 12/28/2010  . Vertigo     Past Surgical History:  Procedure Laterality Date  . ABDOMINAL HYSTERECTOMY  1974   partial  . APPENDECTOMY     as a child/notes 12/28/2010  . BACK SURGERY    . BREAST BIOPSY Left 1999   Archie Endo 12/28/2010  . CATARACT EXTRACTION W/ INTRAOCULAR LENS  IMPLANT, BILATERAL Bilateral 2000s  . ESOPHAGOGASTRODUODENOSCOPY  04/2004   Archie Endo 12/28/2010  . FIXATION KYPHOPLASTY THORACIC SPINE  05/2011   T10/notes 05/25/2011  . FRACTURE SURGERY    . HIP ARTHROPLASTY Right 01/19/2017   Procedure: RIGHT HIP HEMIARTHROPLASTY;  Surgeon: Renette Butters, MD;  Location: [place];  Service: Orthopedics;  Laterality: Right;  . LAPAROSCOPIC  CHOLECYSTECTOMY  10/2007   Archie Endo 12/14/2010  . LOWER EXTREMITY ANGIOGRAPHY N/A 10/09/2017   Procedure: LOWER EXTREMITY ANGIOGRAPHY;  Surgeon: Lorretta Harp, MD;  Location: [place] [facility];  Service: Cardiovascular;  Laterality: N/A;  bilateral  . LOWER EXTREMITY ANGIOGRAPHY  11/21/2017   Procedure: Lower Extremity Angiography;  Surgeon: Adrian Prows, MD;  Location: [place] [facility];  Service: Cardiovascular;;  . PERIPHERAL VASCULAR BALLOON ANGIOPLASTY  10/09/2017   Attempted PTA Archie Endo 10/09/2017  . PERIPHERAL VASCULAR BALLOON ANGIOPLASTY  10/09/2017   Procedure: PERIPHERAL VASCULAR BALLOON ANGIOPLASTY;  Surgeon: Lorretta Harp, MD;  Location: [place] [facility];  Service: Cardiovascular;;  Attempted PTA     There were no vitals filed for this visit.  Subjective Assessment - 10/10/18 1537    Subjective  No pain today, Felt good after my last workout. Even my energy has been good.    Pertinent History  complex medical history including mulitple myeloma    Currently in Pain?  No/denies    Multiple Pain Sites  No                       [facility] Adult PT Treatment/Exercise - 10/10/18 0001      Lumbar Exercises: Aerobic   Nustep  L2 x 10 min  PTA present to monitor pt     Lumbar Exercises: Standing   Heel Raises  20 reps   2 sets of 10     Knee/Hip Exercises: Standing   Hip Abduction  Stengthening;Both;1 set;10 reps;Knee straight   2# added today   Other Standing Knee Exercises  240 feet ambulating with walker before requesting to sit down      Knee/Hip Exercises: Seated   Long Arc Quad  Strengthening;Both;1 set;20 reps;Weights    Long Arc Quad Weight  2 lbs.    Ball Squeeze  25x    Clamshell with TheraBand  Green   20x   Marching  Strengthening;Both;1 set;20 reps;Weights   sitting and standing   Marching Weights  2 lbs.      Shoulder Exercises: Seated   Horizontal ABduction  --   red band 10x seated   Flexion  --   Overhaed lift with 1# bil 10x               PT Short Term Goals - 10/08/18 1553      PT SHORT TERM GOAL #2   Title  Pt will be able to perform TUG test to estabilish baseline for a LTG.    Period  Weeks    Status  Achieved      PT SHORT TERM GOAL #3   Title  Pt will report at least 20% greater endurance with gait for household distances.    Time  4    Period  Weeks    Status  Achieved   25%       PT Long Term Goals - 10/08/18 1554      PT LONG TERM GOAL #2   Title  Pt will perform 5x sit to stand in </= 28 sec demonstrating reduction in fall risk.    Time  8    Period  Weeks    Status  Achieved            Plan - 10/10/18 1539    Clinical Impression Statement  Pt arrives today with reports of no pain and good energy over the last few days. She was able to use light resistance for LE & UE today for all exercises ( including standing). She was definitiely fatigued at the end of her session today. Daughter reports her mom is standing up better at home.     Rehab Potential  Good    PT Frequency  2x / week    PT Duration  8 weeks    PT Treatment/Interventions  ADLs/Self Care Home Management;Moist Heat;Therapeutic exercise;Therapeutic activities;Functional mobility training;Balance training;Neuromuscular re-education;Patient/family education;Manual techniques;Passive range of motion;Taping;Electrical Stimulation    PT Next Visit Plan  Low level exercise, LE strength    PT Home Exercise Plan  Access Code: [email or web address]    Consulted and Agree with Plan of Care  Patient       Patient will benefit from skilled therapeutic intervention in order to improve the following deficits and impairments:  Abnormal gait, Improper body mechanics, Decreased mobility, Postural dysfunction, Decreased activity tolerance, Decreased endurance, Decreased range of motion, Decreased strength, Decreased balance, Difficulty walking, Impaired flexibility  Visit Diagnosis: Muscle weakness (generalized)  Other abnormalities of gait  and mobility     Problem List Patient Active Problem List   Diagnosis Date Noted  . Critical lower limb ischemia 10/04/2017  . Advance care planning   . Goals of care, counseling/discussion   . Palliative care by specialist   . CKD (chronic  kidney disease), stage III ([place]) 01/18/2017  . Hip fracture, pathological ([place]) 01/18/2017  . Pathologic hip fracture ([place]) 01/18/2017  . DNR (do not resuscitate) discussion 11/01/2016  . Multiple myeloma ([place]) 10/31/2016  . Acute encephalopathy 10/18/2016  . Hypercalcemia 10/18/2016  . Fall   . Anemia in neoplastic disease 09/23/2016  . Sepsis ([place]) 09/23/2016  . Back pain 09/23/2016  . Intractable back pain 06/20/2016  . Other malaise and fatigue 11/26/2013  . Vertigo   . Obesity   . Arthritis of knee, left 07/09/2013  . Need for prophylactic vaccination and inoculation against influenza 07/09/2013  . Osteoporosis with pathological fracture 01/01/2013  . HLD (hyperlipidemia) 01/01/2013  . HTN (hypertension) 01/01/2013  . Chronic back pain 01/01/2013  . Vertebral compression fracture ([place]) 01/01/2013  . Seasonal allergic rhinitis 01/01/2013    Dalayna Lauter, PTA 10/10/2018, 4:16 PM  [facility] [address], [place], [place], [address] Phone: [phone]   Fax:  [phone]  Name: BAMA HANSELMAN MRN: [id number] Date of Birth: 1930/10/11

## 2018-10-15 ENCOUNTER — Ambulatory Visit: Payer: Medicare Other | Attending: Hematology | Admitting: Physical Therapy

## 2018-10-15 ENCOUNTER — Encounter: Payer: Self-pay | Admitting: Physical Therapy

## 2018-10-15 DIAGNOSIS — R2689 Other abnormalities of gait and mobility: Secondary | ICD-10-CM | POA: Insufficient documentation

## 2018-10-15 DIAGNOSIS — M25511 Pain in right shoulder: Secondary | ICD-10-CM

## 2018-10-15 DIAGNOSIS — M25611 Stiffness of right shoulder, not elsewhere classified: Secondary | ICD-10-CM

## 2018-10-15 DIAGNOSIS — M6281 Muscle weakness (generalized): Secondary | ICD-10-CM | POA: Insufficient documentation

## 2018-10-15 DIAGNOSIS — M25551 Pain in right hip: Secondary | ICD-10-CM | POA: Diagnosis present

## 2018-10-15 NOTE — Therapy (Signed)
[facility] [address], [place], [place], [address] Phone: [phone]   Fax:  [phone]  Physical Therapy Treatment  Patient Details  Name: Kristin [name] MRN: [id number] Date of Birth: 1931-01-27 Referring Provider (PT): Truitt Merle, MD   Encounter Date: 10/15/2018  PT End of Session - 10/15/18 1536    Visit Number  9    Date for PT Re-Evaluation  10/31/18    Authorization Type  Med pay    Authorization Time Period  09/05/18-10/31/18    Authorization - Visit Number  15    Authorization - Number of Visits  20    PT Start Time  9476    PT Stop Time  1612    PT Time Calculation (min)  38 min    Activity Tolerance  Patient tolerated treatment well    Behavior During Therapy  [facility] for tasks assessed/performed       Past Medical History:  Diagnosis Date  . Bone cancer [facility])    "being tx'd by Dr. Burr Medico @ [facility]" (10/09/2017)  . Chronic kidney disease (CKD), stage III (moderate) (HCC)   . Depression    Risperdal "twice/day" (09/23/2016)  . Glaucoma   . Hiatal hernia   . Hypertension   . Intractable back pain    /notes 09/23/2016  . Obesity   . Osteoarthritis    "all over" (10/09/2017)  . Osteoporosis    vertebral fracture in 2012  . Peripheral vascular disease ([place])   . Upper GI bleed 04/2004   secondary to Mallory-Weiss tear/notes 12/28/2010  . Vertigo     Past Surgical History:  Procedure Laterality Date  . ABDOMINAL HYSTERECTOMY  1974   partial  . APPENDECTOMY     as a child/notes 12/28/2010  . BACK SURGERY    . BREAST BIOPSY Left 1999   Archie Endo 12/28/2010  . CATARACT EXTRACTION W/ INTRAOCULAR LENS  IMPLANT, BILATERAL Bilateral 2000s  . ESOPHAGOGASTRODUODENOSCOPY  04/2004   Archie Endo 12/28/2010  . FIXATION KYPHOPLASTY THORACIC SPINE  05/2011   T10/notes 05/25/2011  . FRACTURE SURGERY    . HIP ARTHROPLASTY Right 01/19/2017   Procedure: RIGHT HIP HEMIARTHROPLASTY;  Surgeon: Renette Butters, MD;   Location: [place];  Service: Orthopedics;  Laterality: Right;  . LAPAROSCOPIC CHOLECYSTECTOMY  10/2007   Archie Endo 12/14/2010  . LOWER EXTREMITY ANGIOGRAPHY N/A 10/09/2017   Procedure: LOWER EXTREMITY ANGIOGRAPHY;  Surgeon: Lorretta Harp, MD;  Location: [place] [facility];  Service: Cardiovascular;  Laterality: N/A;  bilateral  . LOWER EXTREMITY ANGIOGRAPHY  11/21/2017   Procedure: Lower Extremity Angiography;  Surgeon: Adrian Prows, MD;  Location: [place] [facility];  Service: Cardiovascular;;  . PERIPHERAL VASCULAR BALLOON ANGIOPLASTY  10/09/2017   Attempted PTA Archie Endo 10/09/2017  . PERIPHERAL VASCULAR BALLOON ANGIOPLASTY  10/09/2017   Procedure: PERIPHERAL VASCULAR BALLOON ANGIOPLASTY;  Surgeon: Lorretta Harp, MD;  Location: [place] [facility];  Service: Cardiovascular;;  Attempted PTA     There were no vitals filed for this visit.  Subjective Assessment - 10/15/18 1537    Subjective  Pt is doing well.  Not as much energy today.      Pertinent History  complex medical history including mulitple myeloma    Limitations  Standing;Walking;House hold activities;Lifting    How long can you walk comfortably?  avoids walking, only goes as far as she needs to    Patient Stated Goals  walk greater distances    Currently in Pain?  No/denies  Aggravating Factors   some days more energy than others                       [facility] Adult PT Treatment/Exercise - 10/15/18 0001      Ambulation/Gait   Ambulation/Gait  Yes    Ambulation/Gait Assistance  6: Modified independent (Device/Increase time)    Ambulation Distance (Feet)  320 Feet    Assistive device  Rollator    Gait Pattern  Step-through pattern;Decreased step length - right;Decreased step length - left   flexed trunk   Ambulation Surface  Level;Indoor      Exercises   Exercises  Shoulder;Lumbar;Knee/Hip      Lumbar Exercises: Aerobic   Nustep  L2 x 10'   PT present to discuss progress, monitor fatigue     Knee/Hip  Exercises: Seated   Long Arc Quad  Strengthening;Both;2 sets;20 reps    Long Arc Quad Weight  2 lbs.    Ball Squeeze  25x   2 sets   Clamshell with Marga Hoots   20 reps   Scientist, physiological;Both    Marching Weights  2 lbs.      Shoulder Exercises: Seated   Row  Strengthening;Theraband;20 reps    Theraband Level (Shoulder Row)  Level 2 (Red)    Flexion  --   1# dumbbell bil UE x 10 reps              PT Short Term Goals - 10/15/18 1542      PT SHORT TERM GOAL #1   Title  I in initial HEP targeting UE/LE strength for posture and improved strength for gait and transfers.    Status  On-going   "sometimes"       PT Long Term Goals - 10/15/18 1543      PT LONG TERM GOAL #3   Title  Pt will report at least 40% greater tolerance with walking household distances with rollator.    Status  Achieved      PT LONG TERM GOAL #4   Title  Pt with achieve at least 4-/5 throughout bil LEs to improve strength for gait and transfers.    Status  On-going      PT LONG TERM GOAL #5   Title  Pt will achieve TUG time of </= 35 with rollator demonstrating reduction in fall risk and improved mobility with sit to stand and gait.    Status  On-going   39 sec 10/15/18           Plan - 10/15/18 1613    Clinical Impression Statement  Pt reports improved ease of household mobility by at least 40%, meeting long term goal.  She admits intermittent compliance with HEP, saying she does it "sometimes."  TUG test today was 39 sec, nearly meeting LTG of 35 sec.  She reports today that her energy isn't as good as last week but did ambulate with rollator 320 feet before need to sit to rest.  She also tolerated increased reps of seated LE ther ex today.  Pt will continue to benefit from skilled PT for gait endurance, LE strength and functional strength to improve participation in daily tasks and household mobility.    PT Frequency  2x / week    PT Duration  8 weeks    PT  Treatment/Interventions  ADLs/Self Care Home Management;Moist Heat;Therapeutic exercise;Therapeutic activities;Functional mobility training;Balance training;Neuromuscular re-education;Patient/family education;Manual techniques;Passive range of motion;Taping;Electrical Stimulation  PT Next Visit Plan  Low level exercise, LE strength, gait endurance    PT Home Exercise Plan  Access Code: [email or web address]    Consulted and Agree with Plan of Care  Patient       Patient will benefit from skilled therapeutic intervention in order to improve the following deficits and impairments:  Abnormal gait, Improper body mechanics, Decreased mobility, Postural dysfunction, Decreased activity tolerance, Decreased endurance, Decreased range of motion, Decreased strength, Decreased balance, Difficulty walking, Impaired flexibility  Visit Diagnosis: Muscle weakness (generalized)  Other abnormalities of gait and mobility  Right shoulder pain, unspecified chronicity  Pain in right hip  Stiffness of right shoulder, not elsewhere classified     Problem List Patient Active Problem List   Diagnosis Date Noted  . Critical lower limb ischemia 10/04/2017  . Advance care planning   . Goals of care, counseling/discussion   . Palliative care by specialist   . CKD (chronic kidney disease), stage III ([place]) 01/18/2017  . Hip fracture, pathological ([place]) 01/18/2017  . Pathologic hip fracture ([place]) 01/18/2017  . DNR (do not resuscitate) discussion 11/01/2016  . Multiple myeloma ([place]) 10/31/2016  . Acute encephalopathy 10/18/2016  . Hypercalcemia 10/18/2016  . Fall   . Anemia in neoplastic disease 09/23/2016  . Sepsis ([place]) 09/23/2016  . Back pain 09/23/2016  . Intractable back pain 06/20/2016  . Other malaise and fatigue 11/26/2013  . Vertigo   . Obesity   . Arthritis of knee, left 07/09/2013  . Need for prophylactic vaccination and inoculation against influenza 07/09/2013  . Osteoporosis with pathological  fracture 01/01/2013  . HLD (hyperlipidemia) 01/01/2013  . HTN (hypertension) 01/01/2013  . Chronic back pain 01/01/2013  . Vertebral compression fracture ([place]) 01/01/2013  . Seasonal allergic rhinitis 01/01/2013    Baruch Merl, PT 10/15/18 4:14 PM   [facility] [address], [place], [place], [address] Phone: [phone]   Fax:  [phone]  Name: ELANORE TALCOTT MRN: [id number] Date of Birth: 09-May-1931

## 2018-10-17 ENCOUNTER — Ambulatory Visit: Payer: Medicare Other | Admitting: Physical Therapy

## 2018-10-17 ENCOUNTER — Encounter: Payer: Self-pay | Admitting: Physical Therapy

## 2018-10-17 ENCOUNTER — Other Ambulatory Visit: Payer: Self-pay | Admitting: *Deleted

## 2018-10-17 DIAGNOSIS — C9000 Multiple myeloma not having achieved remission: Secondary | ICD-10-CM

## 2018-10-17 DIAGNOSIS — R2689 Other abnormalities of gait and mobility: Secondary | ICD-10-CM

## 2018-10-17 DIAGNOSIS — M6281 Muscle weakness (generalized): Secondary | ICD-10-CM

## 2018-10-17 MED ORDER — LENALIDOMIDE 5 MG PO CAPS: 5.0000 mg | ORAL_CAPSULE | Freq: Every day | ORAL | 0 refills | Status: DC

## 2018-10-17 NOTE — Therapy (Addendum)
[facility] [address] [place], [place], [address] Phone: [phone]   Fax:  [phone]  Physical Therapy Treatment  Patient Details  Name: Kristin [name] MRN: [id number] Date of Birth: May 03, 1931 Referring Provider (PT): Truitt Merle, MD  Progress Note Reporting Period 09/05/18  to 10/17/18  See note below for Objective Data and Assessment of Progress/Goals.      Encounter Date: 10/17/2018  PT End of Session - 10/17/18 1544    Visit Number  10    Date for PT Re-Evaluation  10/31/18    Authorization Type  Med pay    Authorization Time Period  09/05/18-10/31/18    PT Start Time  1540   10 min late   PT Stop Time  1615    PT Time Calculation (min)  35 min    Activity Tolerance  Patient tolerated treatment well    Behavior During Therapy  [facility] for tasks assessed/performed       Past Medical History:  Diagnosis Date  . Bone cancer [facility])    "being tx'd by Dr. Burr Medico @ [facility]" (10/09/2017)  . Chronic kidney disease (CKD), stage III (moderate) (HCC)   . Depression    Risperdal "twice/day" (09/23/2016)  . Glaucoma   . Hiatal hernia   . Hypertension   . Intractable back pain    /notes 09/23/2016  . Obesity   . Osteoarthritis    "all over" (10/09/2017)  . Osteoporosis    vertebral fracture in 2012  . Peripheral vascular disease ([place])   . Upper GI bleed 04/2004   secondary to Mallory-Weiss tear/notes 12/28/2010  . Vertigo     Past Surgical History:  Procedure Laterality Date  . ABDOMINAL HYSTERECTOMY  1974   partial  . APPENDECTOMY     as a child/notes 12/28/2010  . BACK SURGERY    . BREAST BIOPSY Left 1999   Archie Endo 12/28/2010  . CATARACT EXTRACTION W/ INTRAOCULAR LENS  IMPLANT, BILATERAL Bilateral 2000s  . ESOPHAGOGASTRODUODENOSCOPY  04/2004   Archie Endo 12/28/2010  . FIXATION KYPHOPLASTY THORACIC SPINE  05/2011   T10/notes 05/25/2011  . FRACTURE SURGERY    . HIP ARTHROPLASTY Right 01/19/2017   Procedure:  RIGHT HIP HEMIARTHROPLASTY;  Surgeon: Renette Butters, MD;  Location: [place];  Service: Orthopedics;  Laterality: Right;  . LAPAROSCOPIC CHOLECYSTECTOMY  10/2007   Archie Endo 12/14/2010  . LOWER EXTREMITY ANGIOGRAPHY N/A 10/09/2017   Procedure: LOWER EXTREMITY ANGIOGRAPHY;  Surgeon: Lorretta Harp, MD;  Location: [place] [facility];  Service: Cardiovascular;  Laterality: N/A;  bilateral  . LOWER EXTREMITY ANGIOGRAPHY  11/21/2017   Procedure: Lower Extremity Angiography;  Surgeon: Adrian Prows, MD;  Location: [place] [facility];  Service: Cardiovascular;;  . PERIPHERAL VASCULAR BALLOON ANGIOPLASTY  10/09/2017   Attempted PTA Archie Endo 10/09/2017  . PERIPHERAL VASCULAR BALLOON ANGIOPLASTY  10/09/2017   Procedure: PERIPHERAL VASCULAR BALLOON ANGIOPLASTY;  Surgeon: Lorretta Harp, MD;  Location: [place] [facility];  Service: Cardiovascular;;  Attempted PTA     There were no vitals filed for this visit.  Subjective Assessment - 10/17/18 1545    Subjective  "I'm here today." "That is all I can say."    Pertinent History  complex medical history including mulitple myeloma    Currently in Pain?  No/denies    Multiple Pain Sites  No         [facility] PT Assessment - 10/17/18 0001      Assessment   Medical Diagnosis  C90.00 (ICD-10-CM) - Multiple myeloma not having achieved remission [facility])    Referring Provider (PT)  Truitt Merle, MD      Ambulation/Gait   Ambulation/Gait  Yes    Ambulation/Gait Assistance  6: Modified independent (Device/Increase time)    Ambulation Distance (Feet)  320 Feet    Assistive device  Rollator    Gait Pattern  Step-through pattern;Decreased step length - right;Decreased step length - left   flexed trunk                  [facility] Adult PT Treatment/Exercise - 10/17/18 0001      Lumbar Exercises: Aerobic   Nustep  L2 x 10'   PT present to discuss progress, monitor fatigue     Knee/Hip Exercises: Standing   Other Standing Knee Exercises  320 feet with RW   Pt  very fatigued today with her walking, barely met 3 laps     Knee/Hip Exercises: Seated   Long Arc Quad  Strengthening;Both;2 sets;20 reps    Long Arc Quad Weight  --   2.5   Ball Squeeze  25x   2 sets   Clamshell with [employer]   25 reps   Scientist, physiological;Both   Performed in standing today.   Marching Weights  --   2.5              PT Short Term Goals - 10/17/18 1606      PT SHORT TERM GOAL #1   Title  I in initial HEP targeting UE/LE strength for posture and improved strength for gait and transfers.    Time  4    Period  Weeks    Status  On-going      PT SHORT TERM GOAL #2   Title  Pt will be able to perform TUG test to estabilish baseline for a LTG.    Baseline  38 sec    Period  Weeks    Status  Achieved    Target Date  09/19/18      PT SHORT TERM GOAL #3   Title  Pt will report at least 20% greater endurance with gait for household distances.    Baseline  70% improved overall    Time  4    Period  Weeks    Status  Achieved        PT Long Term Goals - 10/15/18 1543      PT LONG TERM GOAL #3   Title  Pt will report at least 40% greater tolerance with walking household distances with rollator.    Status  Achieved      PT LONG TERM GOAL #4   Title  Pt with achieve at least 4-/5 throughout bil LEs to improve strength for gait and transfers.    Status  On-going      PT LONG TERM GOAL #5   Title  Pt will achieve TUG time of </= 35 with rollator demonstrating reduction in fall risk and improved mobility with sit to stand and gait.    Status  On-going   39 sec 10/15/18           Plan - 10/17/18 1545    Clinical Impression Statement  Energy levels a bit low today although pt was able to add a little more resisatnce to her LE strengthening exercises today in addition to more standing exercises. Pt demonstrated increased fatigue with her walking today, needed verbal cuing she does not normally need  like staying close to her walker.      Rehab Potential  Good    PT Frequency  2x / week    PT Duration  8 weeks    PT Treatment/Interventions  ADLs/Self Care Home Management;Moist Heat;Therapeutic exercise;Therapeutic activities;Functional mobility training;Balance training;Neuromuscular re-education;Patient/family education;Manual techniques;Passive range of motion;Taping;Electrical Stimulation    PT Next Visit Plan  Low level exercise, LE strength, gait endurance    PT Home Exercise Plan  Access Code: [email or web address]    Consulted and Agree with Plan of Care  Patient       Patient will benefit from skilled therapeutic intervention in order to improve the following deficits and impairments:  Abnormal gait, Improper body mechanics, Decreased mobility, Postural dysfunction, Decreased activity tolerance, Decreased endurance, Decreased range of motion, Decreased strength, Decreased balance, Difficulty walking, Impaired flexibility  Visit Diagnosis: Muscle weakness (generalized)  Other abnormalities of gait and mobility     Problem List Patient Active Problem List   Diagnosis Date Noted  . Critical lower limb ischemia 10/04/2017  . Advance care planning   . Goals of care, counseling/discussion   . Palliative care by specialist   . CKD (chronic kidney disease), stage III ([place]) 01/18/2017  . Hip fracture, pathological ([place]) 01/18/2017  . Pathologic hip fracture ([place]) 01/18/2017  . DNR (do not resuscitate) discussion 11/01/2016  . Multiple myeloma ([place]) 10/31/2016  . Acute encephalopathy 10/18/2016  . Hypercalcemia 10/18/2016  . Fall   . Anemia in neoplastic disease 09/23/2016  . Sepsis ([place]) 09/23/2016  . Back pain 09/23/2016  . Intractable back pain 06/20/2016  . Other malaise and fatigue 11/26/2013  . Vertigo   . Obesity   . Arthritis of knee, left 07/09/2013  . Need for prophylactic vaccination and inoculation against influenza 07/09/2013  . Osteoporosis with pathological fracture 01/01/2013  . HLD (hyperlipidemia)  01/01/2013  . HTN (hypertension) 01/01/2013  . Chronic back pain 01/01/2013  . Vertebral compression fracture ([place]) 01/01/2013  . Seasonal allergic rhinitis 01/01/2013    Alene Mires Beuhring, PTA 10/17/2018, 9:27 PM  Addendum:  Spoke to Pt on 11/05/18 and she asked that I call her daughter Tawanna Cooler to coordinate emailing her [place] program to her as she had lost her papers.  Pt reported she will be staying with another daughter during the concerns over the coronavirus.  PT was able to reach [place] on 11/07/18 and discussed Ms. Cullipher's progress and need for continued HEP compliance while our clinic is closed due to the coronavirus.  PT emailed Hassan Rowan the [place] program, counseled her on facilitation of Ms. Pawelski's HEP (be active each waking hour with some household walking and seated exercise), and through discussion came to decide we would D/C Pt at this time to reduce her risk of exposure to the coronavirus.  She is in the care of a daughter who is helping her stay active and safe.  Family understands that if Ms. Hobart would like more PT when our clinic re-opens that she will need another PT Rx from her doctor.  [employer] exercise program is below. Access Code: [email or web address]  URL: [email or web address]  Date: 09/19/2018  Prepared by: Venetia Night Beuhring   Exercises Seated Heel Toe Raises - 10 reps - 3 sets - 1x daily - 7x weekly Seated Long Arc Quad - 10 reps - 3 sets - 1x daily - 7x weekly Proper Sit to Stand Technique - 5 reps - 2 sets - 1x daily - 7x weekly Seated March - 20 reps - 2 sets -  1x daily - 7x weekly Seated Shoulder Row with Anchored Resistance - 15 reps - 2 sets - 1x daily - 7x weekly   PHYSICAL THERAPY DISCHARGE SUMMARY  Visits from Start of Care: 10  Current functional level related to goals / functional outcomes: See above   Remaining deficits: See above   Education / Equipment: HEP Plan: Patient agrees to discharge.  Patient goals were partially  met. Patient is being discharged due to being pleased with the current functional level.  ?????        Baruch Merl, PT 11/07/18 11:58 AM   [facility] [address] [place], [place], [address] Phone: [phone]   Fax:  [phone]  Name: MINTA FAIR MRN: [id number] Date of Birth: 11/10/1930

## 2018-10-22 ENCOUNTER — Telehealth: Payer: Self-pay

## 2018-10-22 NOTE — Telephone Encounter (Signed)
Faxed new script for Revlimid along with new [employer] authorization number to [employer] [id number]

## 2018-10-24 NOTE — Progress Notes (Signed)
[place]   [phone] [phone]   Clinic Follow up Note   Patient Care Team: [name], MD as PCP - General (Family Medicine)  Date of Service:  10/29/2018  CHIEF COMPLAINT: F/u of MM  SUMMARY OF ONCOLOGIC HISTORY:   Multiple myeloma ([place])   10/18/2016 - 10/26/2016 Hospital Admission    Pt was admitted for symptomatic hypercalcemia, AKI, bone marrow biopsy done during hospital stay, and she received Aredia     10/19/2016 Tumor Marker    SPEP showed M-protein 0.4g/dl, serum lambda light chain 5362, ratio 0.00    10/25/2016 Bone Marrow Biopsy     Overall, the marrow is hypercellular with increased lambda-restricted plasma cells (62% aspirate, 90% CD138). There is partial expression of CD20, but no monoclonal B-cell population is detected by flow cytometry.     10/25/2016 Initial Diagnosis    Multiple myeloma ([place])    10/25/2016 Miscellaneous    Cytogenetics and FISH showed loss of 13q, which is intermediate risk for MM     11/10/2016 -  Chemotherapy    Revlimid 10 mg (increased to 7m on cycle 2) daily, 3 weeks on, and one week off, dexamethasone 20 mg weekly, was off from early June to mid July 2018 due to her right hip fracture and hospitalization.  Changed to maintenence Revlimid 157mand stopped dexamethasone starting next cycle, 06/10/17, Revlimid was changed to 81m26maily 3 weeks on, 1 week off in 12/2017        01/02/2017 Imaging     DG Hip Unilat w or w/o Pelvis IMPRESSION 1. Degenerative changes lumbar spine and both hips. No evidence of fracture or dislocation.  2. Diffuse osteopenia. Multiple tiny lucencies are noted throughout both proximal femurs. These may be secondary to the patient's known multiple myeloma.    01/18/2017 Imaging     CT hip right wo contrast  IMPRESSION: Findings consistent with a pathologic fracture of the right femoral neck. A large lytic lesion in the superior aspect of the lateral femoral head  is identified.  Possible nondisplaced fracture of the right inferior pubic ramus.  Diffuse myeloma.    01/18/2017 - 01/23/2017 Hospital Admission    She present to the ED with complaints of severe right hip pain. she recieved a right hip hemiarthroplasty with Dr. MurPercell Miller   CURRENT THERAPY:  maintenance Revlimid 81mg57mily for 3 weeks on and 1 week off starting 05/2017  INTERVAL HISTORY:  Kristin BENTLEYhere for a follow up of MM. She presents to the clinic today with her daughter. She notes she is doing well so far. She has right ankle pain occasionally. This can occur at anytime and she has adequate ROM. Tylenol has not helped. She has not taken her pain meds for this specifically, but has relief with this. She also notes back pain. She was seen by Dr. WongJacelyn Grip this who attributes this to her MM and encouraged her to continue her pain medication. She is currently on [name] and Oxycodone BID. She denies constipation, she takes [employer] copye notes her appetite is adequate and has been able to gain weight. She has been able to walk with to without walker at times. She is currently doing PT.  Her daughter notes she has mild open wound of gum and plans to undergo procedure soon to close it.    REVIEW OF SYSTEMS:   Constitutional: Denies fevers, chills or abnormal weight loss Eyes: Denies blurriness of  vision Ears, nose, mouth, throat, and face: Denies mucositis or sore throat Respiratory: Denies cough, dyspnea or wheezes Cardiovascular: Denies palpitation, chest discomfort or lower extremity swelling Gastrointestinal:  Denies nausea, heartburn or change in bowel habits Skin: Denies abnormal skin rashes MSK: (+) occasional right ankle pain (+) back pain  Lymphatics: Denies new lymphadenopathy or easy bruising Neurological:Denies numbness, tingling or new weaknesses Behavioral/Psych: Mood is stable, no new changes  All other systems were reviewed with the patient and are  negative.  MEDICAL HISTORY:  Past Medical History:  Diagnosis Date  . Bone cancer [facility])    "being tx'd by Dr. Burr Medico @ [facility]" (10/09/2017)  . Chronic kidney disease (CKD), stage III (moderate) (HCC)   . Depression    Risperdal "twice/day" (09/23/2016)  . Glaucoma   . Hiatal hernia   . Hypertension   . Intractable back pain    /notes 09/23/2016  . Obesity   . Osteoarthritis    "all over" (10/09/2017)  . Osteoporosis    vertebral fracture in 2012  . Peripheral vascular disease ([place])   . Upper GI bleed 04/2004   secondary to Mallory-Weiss tear/notes 12/28/2010  . Vertigo     SURGICAL HISTORY: Past Surgical History:  Procedure Laterality Date  . ABDOMINAL HYSTERECTOMY  1974   partial  . APPENDECTOMY     as a child/notes 12/28/2010  . BACK SURGERY    . BREAST BIOPSY Left 1999   Archie Endo 12/28/2010  . CATARACT EXTRACTION W/ INTRAOCULAR LENS  IMPLANT, BILATERAL Bilateral 2000s  . ESOPHAGOGASTRODUODENOSCOPY  04/2004   Archie Endo 12/28/2010  . FIXATION KYPHOPLASTY THORACIC SPINE  05/2011   T10/notes 05/25/2011  . FRACTURE SURGERY    . HIP ARTHROPLASTY Right 01/19/2017   Procedure: RIGHT HIP HEMIARTHROPLASTY;  Surgeon: Renette Butters, MD;  Location: [facility];  Service: Orthopedics;  Laterality: Right;  . LAPAROSCOPIC CHOLECYSTECTOMY  10/2007   Archie Endo 12/14/2010  . LOWER EXTREMITY ANGIOGRAPHY N/A 10/09/2017   Procedure: LOWER EXTREMITY ANGIOGRAPHY;  Surgeon: Lorretta Harp, MD;  Location: [facility];  Service: Cardiovascular;  Laterality: N/A;  bilateral  . LOWER EXTREMITY ANGIOGRAPHY  11/21/2017   Procedure: Lower Extremity Angiography;  Surgeon: Adrian Prows, MD;  Location: [facility];  Service: Cardiovascular;;  . PERIPHERAL VASCULAR BALLOON ANGIOPLASTY  10/09/2017   Attempted PTA Archie Endo 10/09/2017  . PERIPHERAL VASCULAR BALLOON ANGIOPLASTY  10/09/2017   Procedure: PERIPHERAL VASCULAR BALLOON ANGIOPLASTY;  Surgeon: Lorretta Harp, MD;  Location: [facility];   Service: Cardiovascular;;  Attempted PTA     I have reviewed the social history and family history with the patient and they are unchanged from previous note.  ALLERGIES:  is allergic to penicillins; tramadol; alendronate; and sulfonamide derivatives.  MEDICATIONS:  Current Outpatient Medications  Medication Sig Dispense Refill  . amlodipine-atorvastatin (CADUET) 2.5-10 MG tablet Take 1 tablet by mouth daily.    Marland Kitchen aspirin 81 MG chewable tablet Chew 81 mg by mouth daily.    Marland Kitchen atorvastatin (LIPITOR) 10 MG tablet Take 10 mg by mouth daily.    . Calcium Carbonate (CALCIUM 600 PO) Take 600 mg by mouth daily.     . clindamycin (CLEOCIN) 300 MG capsule Take 1 capsule (300 mg total) by mouth 3 (three) times daily. 21 capsule 0  . clopidogrel (PLAVIX) 75 MG tablet Take 75 mg by mouth daily.    . feeding supplement, ENSURE ENLIVE, (ENSURE ENLIVE) LIQD Take 237 mLs by mouth 2 (two) times daily between meals. (Patient  taking differently: Take 237 mLs by mouth daily. ) 237 mL 12  . furosemide (LASIX) 20 MG tablet Take 20 mg by mouth daily.    . irbesartan (AVAPRO) 75 MG tablet Take 75 mg by mouth daily after supper.    Marland Kitchen lenalidomide (REVLIMID) 5 MG capsule Take 1 capsule (5 mg total) by mouth daily. Take daily x 3 weeks then 1 week rest. ADULT Female:  Authorization # [id number] 10/17/2018 Faxed to [facility] @ [phone] 21 capsule 0  . magnesium oxide (MAG-OX) 400 (241.3 Mg) MG tablet Take 1 tablet (400 mg total) by mouth daily. 7 tablet 0  . morphine ([name]) 30 MG 12 hr tablet Take 1 tablet (30 mg total) by mouth every 12 (twelve) hours. 60 tablet 0  . ondansetron (ZOFRAN ODT) 4 MG disintegrating tablet Take 1-2 tablets (4-8 mg total) by mouth every 8 (eight) hours as needed for nausea or vomiting. (Patient taking differently: Take 4 mg by mouth daily. ) 30 tablet 2  . Oxycodone HCl 10 MG TABS Take 0.5-1 tablets (5-10 mg total) by mouth every 6 (six) hours as needed. FOR MODERATE OR SEVERE PAIN 90  tablet 0  . RESTASIS 0.05 % ophthalmic emulsion Place 1 drop into both eyes daily.     . risperiDONE (RISPERDAL) 0.5 MG tablet Take 0.5 mg by mouth daily.     Marland Kitchen SANTYL ointment Apply 1 application topically every other day.  0  . sennosides-docusate sodium (SENOKOT-S) 8.6-50 MG tablet Take 1 tablet by mouth 2 (two) times daily.     Marland Kitchen triamcinolone (KENALOG) 0.025 % cream APPLY TO AFFECTED AREA TWICE A DAY FOR 10 DAYS  1   No current facility-administered medications for this visit.    Facility-Administered Medications Ordered in Other Visits  Medication Dose Route Frequency Provider Last Rate Last Dose  . denosumab (XGEVA) injection 120 mg  120 mg Subcutaneous Once Truitt Merle, MD        PHYSICAL EXAMINATION: ECOG PERFORMANCE STATUS: 3 - Symptomatic, >50% confined to bed  Vitals:   10/29/18 1458  BP: (!) 142/71  Pulse: 74  Resp: 17  Temp: 98.2 F (36.8 C)  SpO2: 99%   Filed Weights   10/29/18 1458  Weight: 148 lb 6.4 oz (67.3 kg)    GENERAL:alert, no distress and comfortable SKIN: skin color, texture, turgor are normal, no rashes or significant lesions EYES: normal, Conjunctiva are pink and non-injected, sclera clear OROPHARYNX:no exudate, no erythema and lips, buccal mucosa, and tongue normal  NECK: supple, thyroid normal size, non-tender, without nodularity LYMPH:  no palpable lymphadenopathy in the cervical, axillary or inguinal LUNGS: clear to auscultation and percussion with normal breathing effort HEART: regular rate & rhythm and no murmurs and no lower extremity edema ABDOMEN:abdomen soft, non-tender and normal bowel sounds Musculoskeletal:no cyanosis of digits and no clubbing (+) Tenderness of right ankle, no skin erythema or limited ROM NEURO: alert & oriented x 3 with fluent speech, no focal motor/sensory deficits  LABORATORY DATA:  I have reviewed the data as listed CBC Latest Ref Rng & Units 10/29/2018 08/29/2018 07/18/2018  WBC 4.0 - 10.5 K/uL 5.1 5.7 6.4   Hemoglobin 12.0 - 15.0 g/dL 11.5(L) 10.8(L) 11.1(L)  Hematocrit 36.0 - 46.0 % 35.3(L) 32.0(L) 32.9(L)  Platelets 150 - 400 K/uL 196 155 193     CMP Latest Ref Rng & Units 10/29/2018 08/29/2018 07/18/2018  Glucose 70 - 99 mg/dL 115(H) 126(H) 133(H)  BUN 8 - 23 mg/dL 22 22 15   Creatinine  0.44 - 1.00 mg/dL 1.58(H) 1.71(H) 1.35(H)  Sodium 135 - 145 mmol/L 142 141 140  Potassium 3.5 - 5.1 mmol/L 3.5 3.5 3.7  Chloride 98 - 111 mmol/L 106 105 106  CO2 22 - 32 mmol/L 25 29 28   Calcium 8.9 - 10.3 mg/dL 9.2 9.1 8.9  Total Protein 6.5 - 8.1 g/dL 7.3 7.0 6.8  Total Bilirubin 0.3 - 1.2 mg/dL 0.5 0.6 0.5  Alkaline Phos 38 - 126 U/L 115 94 127(H)  AST 15 - 41 U/L 30 26 23   ALT 0 - 44 U/L 30 23 24       RADIOGRAPHIC STUDIES: I have personally reviewed the radiological images as listed and agreed with the findings in the report. No results found.   ASSESSMENT & PLAN:  Kristin [name] is a 85 y.o. female with   1. Multiple Myeloma, lambda light chain disease, staging III, intermediate risk (13q-) -She was diagnosed in March 2018, initially treated with Revlimid and dexamethasonefor 6 month with good response and ir currently being treated with maintenanceRevlimid 5 mg once daily 3 weeks on 1 week off. -Delton See has been held due to concern of jaw necrosis -Her M protein has been nondetectable.   She has had a good response to treatment.  She will repeat MM panel every 8 weeks -She is clinically doing well, Labs reviewed, CBC WNL except mild anemia with Hg 11.5, CMP WNL except elevated Cr at 1.58. Her MM panel is still pending.  -Continue Maintenance Revlimid. She is tolerating well. We discussed potential side effects again, especially risk of infection and thrombosis.  I encouraged her to be physically active and continue exercise at home. She recently finished a course of outpatient physical therapy. -F/u 2 months   2. Anemia -Due to MM and Revlimid. -Will consider blood transfusion if Hb <8  or symptomatic anemia with Hg 8-9 -Labs reviewed,Hg at 11.5 today (10/29/18)   3. Chronic CKD, stage III -Avoid nephrotoxic drugs and contrasts. -Cr at 1.58 today (10/29/18), overall stable   4. Severe back pain and right hip pain from pathologic fracture, left flank pain along left costal margin -Compression fracture at T2 -She takes [name] 30 mg twice daily, and oxycodone 5-32m BID as needed, pain has been well controlled lately. Stable. I refilled today (10/29/18) -She will continue Senakot as needed to prevent constipation.  -This should help her new right ankle pain which is likely secondary to OA.   5. HTN -f/u with PCP and continue medications -BP elevated at 142/71 (10/29/18)  6. Osteoporosis -Xgeva started in 01/02/18 and has been held since 03/27/18 due to concern for jaw necrosis -Continue calcium supplements  7. Goal of care discussion  -The patient understands the goal of care is palliative. -I previously recommended DNR/DNI, she will think about it  9. osteonecrosis of the jaw -Off Xgeva -She will continue mouthwash as per Dr. MSabra Heck-She denies jaw pain, stable.  -She has open wound of gum. She plans to have procedure with her dentist to close this soon.   Plan -refilled morphine and oxycodone today -Continue Revlimid, refilled today  -Lab and f/u in 2 months    No problem-specific Assessment & Plan notes found for this encounter.   No orders of the defined types were placed in this encounter.  All questions were answered. The patient knows to call the clinic with any problems, questions or concerns. No barriers to learning was detected. I spent 20 minutes counseling the patient face to face.  The total time spent in the appointment was 25 minutes and more than 50% was on counseling and review of test results     Truitt Merle, MD 10/29/2018   I, Joslyn Devon, am acting as scribe for Truitt Merle, MD.   I have reviewed the above documentation for  accuracy and completeness, and I agree with the above.

## 2018-10-29 ENCOUNTER — Other Ambulatory Visit: Payer: Self-pay

## 2018-10-29 ENCOUNTER — Telehealth: Payer: Self-pay | Admitting: Hematology

## 2018-10-29 ENCOUNTER — Ambulatory Visit: Payer: Medicare Other | Admitting: Physical Therapy

## 2018-10-29 ENCOUNTER — Inpatient Hospital Stay: Payer: Medicare Other | Attending: Hematology

## 2018-10-29 ENCOUNTER — Encounter: Payer: Self-pay | Admitting: Hematology

## 2018-10-29 ENCOUNTER — Inpatient Hospital Stay (HOSPITAL_BASED_OUTPATIENT_CLINIC_OR_DEPARTMENT_OTHER): Payer: Medicare Other | Admitting: Hematology

## 2018-10-29 VITALS — BP 142/71 | HR 74 | Temp 98.2°F | Resp 17 | Ht 59.0 in | Wt 148.4 lb

## 2018-10-29 DIAGNOSIS — M8000XS Age-related osteoporosis with current pathological fracture, unspecified site, sequela: Secondary | ICD-10-CM

## 2018-10-29 DIAGNOSIS — I129 Hypertensive chronic kidney disease with stage 1 through stage 4 chronic kidney disease, or unspecified chronic kidney disease: Secondary | ICD-10-CM | POA: Insufficient documentation

## 2018-10-29 DIAGNOSIS — Z79899 Other long term (current) drug therapy: Secondary | ICD-10-CM | POA: Diagnosis not present

## 2018-10-29 DIAGNOSIS — E669 Obesity, unspecified: Secondary | ICD-10-CM

## 2018-10-29 DIAGNOSIS — R42 Dizziness and giddiness: Secondary | ICD-10-CM

## 2018-10-29 DIAGNOSIS — Z9221 Personal history of antineoplastic chemotherapy: Secondary | ICD-10-CM | POA: Insufficient documentation

## 2018-10-29 DIAGNOSIS — M549 Dorsalgia, unspecified: Secondary | ICD-10-CM | POA: Diagnosis not present

## 2018-10-29 DIAGNOSIS — K449 Diaphragmatic hernia without obstruction or gangrene: Secondary | ICD-10-CM | POA: Insufficient documentation

## 2018-10-29 DIAGNOSIS — M1990 Unspecified osteoarthritis, unspecified site: Secondary | ICD-10-CM | POA: Insufficient documentation

## 2018-10-29 DIAGNOSIS — I739 Peripheral vascular disease, unspecified: Secondary | ICD-10-CM

## 2018-10-29 DIAGNOSIS — F329 Major depressive disorder, single episode, unspecified: Secondary | ICD-10-CM

## 2018-10-29 DIAGNOSIS — N183 Chronic kidney disease, stage 3 (moderate): Secondary | ICD-10-CM

## 2018-10-29 DIAGNOSIS — M25571 Pain in right ankle and joints of right foot: Secondary | ICD-10-CM | POA: Diagnosis not present

## 2018-10-29 DIAGNOSIS — D649 Anemia, unspecified: Secondary | ICD-10-CM

## 2018-10-29 DIAGNOSIS — M810 Age-related osteoporosis without current pathological fracture: Secondary | ICD-10-CM

## 2018-10-29 DIAGNOSIS — D630 Anemia in neoplastic disease: Secondary | ICD-10-CM

## 2018-10-29 DIAGNOSIS — I10 Essential (primary) hypertension: Secondary | ICD-10-CM

## 2018-10-29 DIAGNOSIS — Z7982 Long term (current) use of aspirin: Secondary | ICD-10-CM

## 2018-10-29 DIAGNOSIS — C9000 Multiple myeloma not having achieved remission: Secondary | ICD-10-CM | POA: Insufficient documentation

## 2018-10-29 LAB — COMPREHENSIVE METABOLIC PANEL
ALT: 30 U/L (ref 0–44)
AST: 30 U/L (ref 15–41)
Albumin: 3.5 g/dL (ref 3.5–5.0)
Alkaline Phosphatase: 115 U/L (ref 38–126)
Anion gap: 11 (ref 5–15)
BILIRUBIN TOTAL: 0.5 mg/dL (ref 0.3–1.2)
BUN: 22 mg/dL (ref 8–23)
CO2: 25 mmol/L (ref 22–32)
Calcium: 9.2 mg/dL (ref 8.9–10.3)
Chloride: 106 mmol/L (ref 98–111)
Creatinine, Ser: 1.58 mg/dL — ABNORMAL HIGH (ref 0.44–1.00)
GFR calc Af Amer: 34 mL/min — ABNORMAL LOW (ref >60)
GFR calc non Af Amer: 29 mL/min — ABNORMAL LOW (ref >60)
Glucose, Bld: 115 mg/dL — ABNORMAL HIGH (ref 70–99)
POTASSIUM: 3.5 mmol/L (ref 3.5–5.1)
Sodium: 142 mmol/L (ref 135–145)
TOTAL PROTEIN: 7.3 g/dL (ref 6.5–8.1)

## 2018-10-29 LAB — CBC WITH DIFFERENTIAL/PLATELET
Abs Immature Granulocytes: 0.02 10*3/uL (ref 0.00–0.07)
Basophils Absolute: 0.1 10*3/uL (ref 0.0–0.1)
Basophils Relative: 2 %
Eosinophils Absolute: 0.1 10*3/uL (ref 0.0–0.5)
Eosinophils Relative: 3 %
HCT: 35.3 % — ABNORMAL LOW (ref 36.0–46.0)
Hemoglobin: 11.5 g/dL — ABNORMAL LOW (ref 12.0–15.0)
IMMATURE GRANULOCYTES: 0 %
Lymphocytes Relative: 40 %
Lymphs Abs: 2 10*3/uL (ref 0.7–4.0)
MCH: 31.9 pg (ref 26.0–34.0)
MCHC: 32.6 g/dL (ref 30.0–36.0)
MCV: 97.8 fL (ref 80.0–100.0)
Monocytes Absolute: 0.5 10*3/uL (ref 0.1–1.0)
Monocytes Relative: 10 %
NRBC: 0 % (ref 0.0–0.2)
Neutro Abs: 2.3 10*3/uL (ref 1.7–7.7)
Neutrophils Relative %: 45 %
Platelets: 196 10*3/uL (ref 150–400)
RBC: 3.61 MIL/uL — AB (ref 3.87–5.11)
RDW: 15.6 % — ABNORMAL HIGH (ref 11.5–15.5)
WBC: 5.1 10*3/uL (ref 4.0–10.5)

## 2018-10-29 MED ORDER — MORPHINE SULFATE ER 30 MG PO TBCR: 30.0000 mg | EXTENDED_RELEASE_TABLET | Freq: Two times a day (BID) | ORAL | 0 refills | Status: DC

## 2018-10-29 MED ORDER — OXYCODONE HCL 10 MG PO TABS: 5.0000 mg | ORAL_TABLET | Freq: Four times a day (QID) | ORAL | 0 refills | Status: DC | PRN

## 2018-10-29 NOTE — Telephone Encounter (Signed)
Scheduled appt per 03/16 los.  Printed calendar and avs.

## 2018-10-30 LAB — MULTIPLE MYELOMA PANEL, SERUM
Albumin SerPl Elph-Mcnc: 3.6 g/dL (ref 2.9–4.4)
Albumin/Glob SerPl: 1.2 (ref 0.7–1.7)
Alpha 1: 0.2 g/dL (ref 0.0–0.4)
Alpha2 Glob SerPl Elph-Mcnc: 0.7 g/dL (ref 0.4–1.0)
B-Globulin SerPl Elph-Mcnc: 0.8 g/dL (ref 0.7–1.3)
Gamma Glob SerPl Elph-Mcnc: 1.5 g/dL (ref 0.4–1.8)
Globulin, Total: 3.2 g/dL (ref 2.2–3.9)
IGM (IMMUNOGLOBULIN M), SRM: 45 mg/dL (ref 26–217)
IgA: 551 mg/dL — ABNORMAL HIGH (ref 64–422)
IgG (Immunoglobin G), Serum: 1533 mg/dL (ref 700–1600)
Total Protein ELP: 6.8 g/dL (ref 6.0–8.5)

## 2018-10-30 LAB — KAPPA/LAMBDA LIGHT CHAINS
Kappa free light chain: 59.8 mg/L — ABNORMAL HIGH (ref 3.3–19.4)
Kappa, lambda light chain ratio: 1.03 (ref 0.26–1.65)
Lambda free light chains: 57.9 mg/L — ABNORMAL HIGH (ref 5.7–26.3)

## 2018-10-31 ENCOUNTER — Encounter

## 2018-11-01 ENCOUNTER — Telehealth: Payer: Self-pay

## 2018-11-01 NOTE — Telephone Encounter (Signed)
-----   Message from Truitt Merle, MD sent at 11/01/2018 10:50 AM EDT ----- Please let pt or her daughter know that her MM lab showed M-protein (-), others are stable, no concerns, thanks   Truitt Merle  11/01/2018

## 2018-11-01 NOTE — Telephone Encounter (Signed)
Left message for patient's daughter Lucita Ferrara, per Dr. Burr Medico lab work showed M-protein is negative, all other labs are stable, no concerns.

## 2018-11-05 IMAGING — DX DG ABDOMEN 1V
1 series · 1 of 1 positions shown · non-contrast
Comparison: Lumbar radiographs 09/22/2016 and earlier.

CLINICAL DATA: 86-year-old female with abdominal pain for several
days. Initial encounter.

EXAM:
ABDOMEN - 1 VIEW

[abdomen kub]
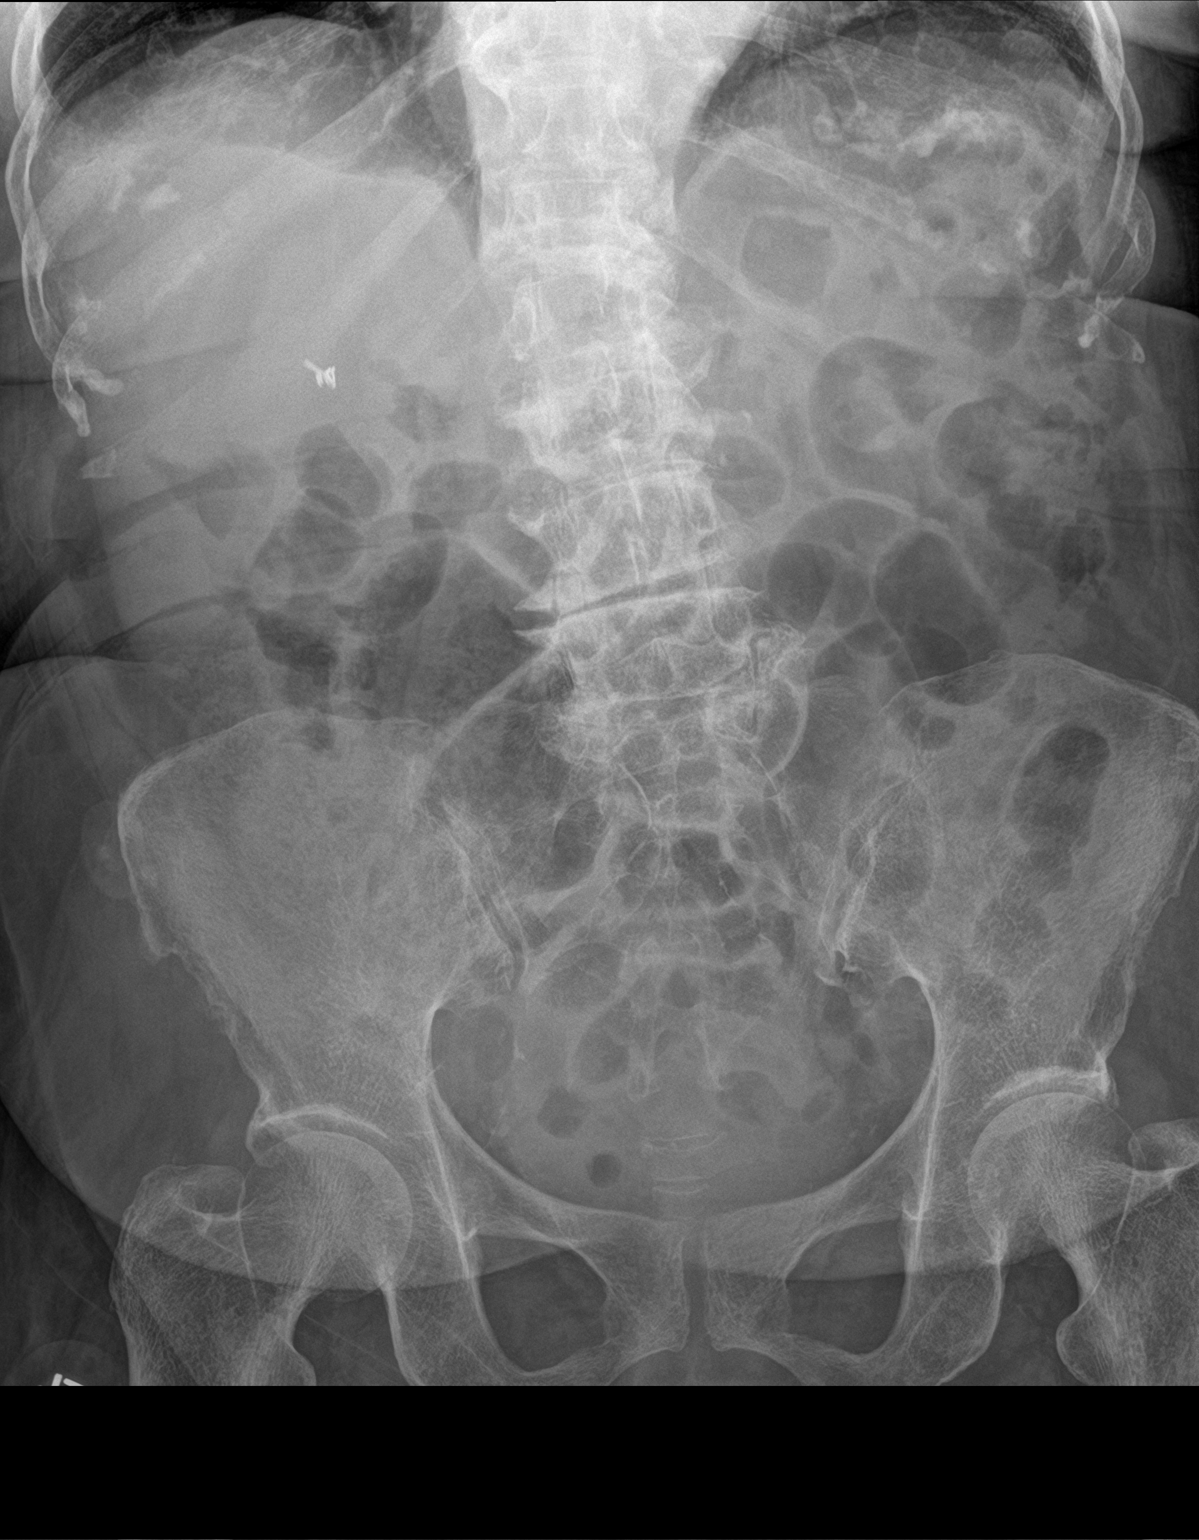

[1 of 1 positions shown; findings below may reference images not displayed]

FINDINGS: Supine view of the abdomen at 3183 hours. Compare to lumbar
radiographs in 8177 the bowel gas pattern appears stable and
nonobstructed. Stable cholecystectomy clips. Aortoiliac calcified
atherosclerosis noted. Stable visualized osseous structures. Grossly
negative lung bases. No definite pneumoperitoneum on this supine
view.
IMPRESSION: Normal bowel gas pattern.

Calcified aortic atherosclerosis.

## 2018-11-07 ENCOUNTER — Ambulatory Visit: Payer: Medicare Other | Admitting: Physical Therapy

## 2018-11-14 ENCOUNTER — Telehealth: Payer: Self-pay

## 2018-11-14 NOTE — Telephone Encounter (Signed)
Sent in refill for Revlimid to [facility], sent to HIM for scanning to chart.

## 2018-11-19 ENCOUNTER — Ambulatory Visit: Payer: No Typology Code available for payment source | Admitting: Physical Therapy

## 2018-11-20 ENCOUNTER — Other Ambulatory Visit: Payer: Self-pay

## 2018-11-20 MED ORDER — CLOPIDOGREL BISULFATE 75 MG PO TABS: 75.0000 mg | ORAL_TABLET | Freq: Every day | ORAL | 1 refills | Status: DC

## 2018-11-21 ENCOUNTER — Other Ambulatory Visit: Payer: Self-pay

## 2018-11-26 ENCOUNTER — Other Ambulatory Visit: Payer: Self-pay

## 2018-11-27 ENCOUNTER — Telehealth: Payer: Self-pay | Admitting: Cardiology

## 2018-11-27 ENCOUNTER — Other Ambulatory Visit: Payer: Self-pay

## 2018-11-27 NOTE — Telephone Encounter (Signed)
Pt does take xarelto 2.5 bid, I added it to her med list

## 2018-11-28 ENCOUNTER — Other Ambulatory Visit: Payer: Self-pay | Admitting: Cardiology

## 2018-11-28 DIAGNOSIS — I998 Other disorder of circulatory system: Secondary | ICD-10-CM

## 2018-11-28 DIAGNOSIS — I739 Peripheral vascular disease, unspecified: Secondary | ICD-10-CM

## 2018-11-28 DIAGNOSIS — I70229 Atherosclerosis of native arteries of extremities with rest pain, unspecified extremity: Secondary | ICD-10-CM

## 2018-11-28 MED ORDER — XARELTO 2.5 MG PO TABS: 2.5000 mg | ORAL_TABLET | Freq: Two times a day (BID) | ORAL | 6 refills | Status: DC

## 2018-11-28 NOTE — Telephone Encounter (Signed)
Did you refill it as well? I can refill it; however, it said it was already ordered.

## 2018-12-02 ENCOUNTER — Other Ambulatory Visit: Payer: Self-pay | Admitting: Hematology

## 2018-12-02 DIAGNOSIS — C9000 Multiple myeloma not having achieved remission: Secondary | ICD-10-CM

## 2018-12-03 ENCOUNTER — Other Ambulatory Visit: Payer: Self-pay | Admitting: Nurse Practitioner

## 2018-12-03 DIAGNOSIS — C9000 Multiple myeloma not having achieved remission: Secondary | ICD-10-CM

## 2018-12-03 MED ORDER — OXYCODONE HCL 10 MG PO TABS: 5.0000 mg | ORAL_TABLET | Freq: Four times a day (QID) | ORAL | 0 refills | Status: DC | PRN

## 2018-12-03 MED ORDER — MORPHINE SULFATE ER 30 MG PO TBCR: 30.0000 mg | EXTENDED_RELEASE_TABLET | Freq: Two times a day (BID) | ORAL | 0 refills | Status: DC

## 2018-12-04 ENCOUNTER — Ambulatory Visit: Payer: Medicare Other | Admitting: Cardiology

## 2018-12-10 ENCOUNTER — Encounter: Payer: Self-pay | Admitting: Cardiology

## 2018-12-11 ENCOUNTER — Ambulatory Visit (INDEPENDENT_AMBULATORY_CARE_PROVIDER_SITE_OTHER): Payer: Medicare Other | Admitting: Cardiology

## 2018-12-11 ENCOUNTER — Encounter: Payer: Self-pay | Admitting: Cardiology

## 2018-12-11 ENCOUNTER — Other Ambulatory Visit: Payer: Self-pay

## 2018-12-11 VITALS — BP 125/61 | HR 77 | Ht 59.0 in | Wt 152.0 lb

## 2018-12-11 DIAGNOSIS — I739 Peripheral vascular disease, unspecified: Secondary | ICD-10-CM | POA: Insufficient documentation

## 2018-12-11 DIAGNOSIS — N183 Chronic kidney disease, stage 3 unspecified: Secondary | ICD-10-CM

## 2018-12-11 DIAGNOSIS — I82513 Chronic embolism and thrombosis of femoral vein, bilateral: Secondary | ICD-10-CM | POA: Diagnosis not present

## 2018-12-11 DIAGNOSIS — I129 Hypertensive chronic kidney disease with stage 1 through stage 4 chronic kidney disease, or unspecified chronic kidney disease: Secondary | ICD-10-CM

## 2018-12-11 DIAGNOSIS — E7800 Pure hypercholesterolemia, unspecified: Secondary | ICD-10-CM

## 2018-12-11 NOTE — Progress Notes (Signed)
Virtual Visit via Video Note: This visit type was conducted due to national recommendations for restrictions regarding the COVID-19 Pandemic (e.g. social distancing).  This format is felt to be most appropriate for this patient at this time.  All issues noted in this document were discussed and addressed.  No physical exam was performed (except for noted visual exam findings with Telehealth visits).  The patient has consented to conduct a Telehealth visit and understands insurance will be billed.   I connected with@, on 12/11/18 at  by a video enabled telemedicine application and verified that I am speaking with the correct person using two identifiers.   I discussed the limitations of evaluation and management by telemedicine and the availability of in person appointments. The patient expressed understanding and agreed to proceed.   I have discussed with patient regarding the safety during COVID Pandemic and steps and precautions to be taken including social distancing, frequent hand wash and use of detergent soap, gels with the patient. I asked the patient to avoid touching mouth, nose, eyes, ears with the hands. I encouraged regular walking around the neighborhood and exercise and regular diet, as long as social distancing can be maintained.   Primary Physician/Referring:  [name], MD  Patient ID: Kristin [name], female    DOB: 09-25-1930, 85 y.o.   MRN: [id number]  Chief Complaint  Patient presents with  . Post-Op Peripheral Artery bypass    10mo . Leg Swelling    HPI: Kristin [name] is a 85y.o. female  with ypertension, hyperlipidemia 85y.o., chronic shortness breath and palpitations that is stable, multiple myeloma in remession, PAD with bilateral nonhealing malleoi ulcer managed by wound care and she underwent successful revascularization of right peroneal and posterior tibial artery by Dr. GBrunetta Jeanson 12/26/2017. He wounds have completely healed and she has no further pain  and not used any narcotics for claudication. She also has chronic recanalized bilateral CFV DVT noted in 2019. She is on low dose Xarelto 2.5 mg BID for PAD.   Leg ulcer has healed completely and she is essentially asymptomatic wxcept for mild weakness in the legs and occasional cramps. No falls or wound discharge, she is not a diabetic, she does not smoke tobacco. She is presently doing well and able to get along at home with help of walker, still has chronic leg edema but denies any leg ulceration or discharge from her legs.  She was fairly active until she had a fall and broke her right hip and had surgery for the same in March 2018.   Past Medical History:  Diagnosis Date  . Bone cancer ([facility]    "[place] [email or web address] [email or web address]" (10/09/2017)  . Chronic deep vein [place] (DVT) of [place]: 12/05/17: Chronic recanalized CFV DVT bilateral 12/05/2017  . Critical lower limb ischemia 10/04/2017   Critical limb ischemia  . Depression    Risperdal "twice/day" (09/23/2016)  . Glaucoma   . Hiatal hernia   . Intractable back pain    /notes 09/23/2016  . Obesity   . Osteoarthritis    "all over" (10/09/2017)  . Osteoporosis    vertebral fracture in 2012  . Peripheral vascular disease ([facility]   . Upper GI bleed 04/2004   secondary to Mallory-Weiss tear/notes 12/28/2010  . Vertigo     Past Surgical History:  Procedure Laterality Date  . ABDOMINAL HYSTERECTOMY  1974   partial  . APPENDECTOMY  as a child/notes 12/28/2010  . BACK SURGERY    . BREAST BIOPSY Left 1999   Archie Endo 12/28/2010  . CATARACT EXTRACTION W/ INTRAOCULAR LENS  IMPLANT, BILATERAL Bilateral 2000s  . ESOPHAGOGASTRODUODENOSCOPY  04/2004   Archie Endo 12/28/2010  . FIXATION KYPHOPLASTY THORACIC SPINE  05/2011   T10/notes 05/25/2011  . FRACTURE SURGERY    . HIP ARTHROPLASTY Right 01/19/2017   Procedure: RIGHT HIP HEMIARTHROPLASTY;  Surgeon: Renette Butters, MD;  Location: [place];  Service: Orthopedics;  Laterality:  Right;  . LAPAROSCOPIC CHOLECYSTECTOMY  10/2007   Archie Endo 12/14/2010  . LOWER EXTREMITY ANGIOGRAPHY N/A 10/09/2017   Procedure: LOWER EXTREMITY ANGIOGRAPHY;  Surgeon: Lorretta Harp, MD;  Location: [place] [facility];  Service: Cardiovascular;  Laterality: N/A;  bilateral  . LOWER EXTREMITY ANGIOGRAPHY  11/21/2017   Procedure: Lower Extremity Angiography;  Surgeon: Adrian Prows, MD;  Location: [place] [facility];  Service: Cardiovascular;;  . PERIPHERAL VASCULAR BALLOON ANGIOPLASTY  10/09/2017   Attempted PTA Archie Endo 10/09/2017  . PERIPHERAL VASCULAR BALLOON ANGIOPLASTY  10/09/2017   Procedure: PERIPHERAL VASCULAR BALLOON ANGIOPLASTY;  Surgeon: Lorretta Harp, MD;  Location: [place] [facility];  Service: Cardiovascular;;  Attempted PTA     Social History   Socioeconomic History  . Marital status: Widowed    Spouse name: Not on file  . Number of children: 5  . Years of education: Not on file  . Highest education level: Not on file  Occupational History  . Not on file  Social Needs  . Financial resource strain: Not on file  . Food insecurity:    Worry: Not on file    Inability: Not on file  . Transportation needs:    Medical: Not on file    Non-medical: Not on file  Tobacco Use  . Smoking status: Never Smoker  . Smokeless tobacco: Never Used  Substance and Sexual Activity  . Alcohol use: Not Currently  . Drug use: No  . Sexual activity: Never  Lifestyle  . Physical activity:    Days per week: Not on file    Minutes per session: Not on file  . Stress: Not on file  Relationships  . Social connections:    Talks on phone: Not on file    Gets together: Not on file    Attends religious service: Not on file    Active member of club or organization: Not on file    Attends meetings of clubs or organizations: Not on file    Relationship status: Not on file  . Intimate partner violence:    Fear of current or ex partner: Not on file    Emotionally abused: Not on file    Physically  abused: Not on file    Forced sexual activity: Not on file  Other Topics Concern  . Not on file  Social History Narrative  . Not on file    Current Outpatient Medications on File Prior to Visit  Medication Sig Dispense Refill  . amlodipine-atorvastatin (CADUET) 2.5-10 MG tablet Take 1 tablet by mouth daily.    Marland Kitchen aspirin 81 MG chewable tablet Chew 81 mg by mouth daily.    Marland Kitchen atorvastatin (LIPITOR) 10 MG tablet Take 10 mg by mouth daily.    . Calcium Carbonate (CALCIUM 600 PO) Take 600 mg by mouth daily.     . chlorhexidine (PERIDEX) 0.12 % solution 2 (two) times a day.    . feeding supplement, ENSURE ENLIVE, (ENSURE ENLIVE) LIQD Take 237 mLs by  mouth 2 (two) times daily between meals. (Patient taking differently: Take 237 mLs by mouth daily. ) 237 mL 12  . furosemide (LASIX) 20 MG tablet Take 20 mg by mouth daily.    . irbesartan (AVAPRO) 75 MG tablet Take 75 mg by mouth daily after supper.    Marland Kitchen lenalidomide (REVLIMID) 5 MG capsule Take 1 capsule (5 mg total) by mouth daily. Take daily x 3 weeks then 1 week rest. ADULT Female:  Authorization # [id number] 10/17/2018 Faxed to [employer] @ [phone] 21 capsule 0  . magnesium oxide (MAG-OX) 400 (241.3 Mg) MG tablet Take 1 tablet (400 mg total) by mouth daily. 7 tablet 0  . mirtazapine (REMERON) 7.5 MG tablet at bedtime.    Marland Kitchen morphine ([name]) 30 MG 12 hr tablet Take 1 tablet (30 mg total) by mouth every 12 (twelve) hours. 60 tablet 0  . ondansetron (ZOFRAN ODT) 4 MG disintegrating tablet Take 1-2 tablets (4-8 mg total) by mouth every 8 (eight) hours as needed for nausea or vomiting. (Patient taking differently: Take 4 mg by mouth daily. ) 30 tablet 2  . Oxycodone HCl 10 MG TABS Take 0.5-1 tablets (5-10 mg total) by mouth every 6 (six) hours as needed. FOR MODERATE OR SEVERE PAIN 90 tablet 0  . RESTASIS 0.05 % ophthalmic emulsion Place 1 drop into both eyes daily.     . risperiDONE (RISPERDAL) 0.5 MG tablet Take 0.5 mg by mouth daily.     .  sennosides-docusate sodium (SENOKOT-S) 8.6-50 MG tablet Take 1 tablet by mouth 2 (two) times daily.     Marland Kitchen triamcinolone (KENALOG) 0.025 % cream APPLY TO AFFECTED AREA TWICE A DAY FOR 10 DAYS  1  . XARELTO 2.5 MG TABS tablet Take 1 tablet (2.5 mg total) by mouth 2 (two) times daily with a meal. 60 tablet 6   Current Facility-Administered Medications on File Prior to Visit  Medication Dose Route Frequency Provider Last Rate Last Dose  . denosumab (XGEVA) injection 120 mg  120 mg Subcutaneous Once Truitt Merle, MD        Review of Systems  Constitution: Negative for chills, decreased appetite, malaise/fatigue and weight gain.  Cardiovascular: Positive for leg swelling (chronic). Negative for dyspnea on exertion and syncope.  Endocrine: Negative for cold intolerance.  Hematologic/Lymphatic: Does not bruise/bleed easily.  Skin: Positive for color change (chronic, no skin breakdown). Negative for rash.  Musculoskeletal: Positive for joint pain (bilateral knee and hip). Negative for joint swelling.  Gastrointestinal: Negative for abdominal pain, anorexia and change in bowel habit.  Neurological: Negative for headaches and light-headedness.  Psychiatric/Behavioral: Negative for depression and substance abuse.  All other systems reviewed and are negative.     Objective  Blood pressure 125/61, pulse 77, height 4' 11"  (1.499 m), weight 152 lb (68.9 kg). Body mass index is 30.7 kg/m. Physical exam not performed or limited due to virtual visit. Please see exam details from prior visit is as below. Brief examination revealed patient to be in no acute distress.  Neck was short and supple.  She had bilateral leg edema, nontender and there is no signs of inflammation.  Chronic dermatitis changes were noted.  No skin breakdown.    Physical Exam  Constitutional: She appears well-nourished. No distress.  Moderately built and obese  HENT:  Head: Atraumatic.  Eyes: Conjunctivae are normal.  Neck: Neck  supple. No JVD present. No thyromegaly present.  Cardiovascular: Normal rate, regular rhythm, normal heart sounds and intact distal pulses. Exam  reveals no gallop.  No murmur heard. Pulses:      Carotid pulses are 2+ on the right side and 2+ on the left side.      Femoral pulses are 2+ on the right side and 2+ on the left side.      Popliteal pulses are 2+ on the right side and 2+ on the left side.       Dorsalis pedis pulses are 1+ on the right side and 2+ on the left side.       Posterior tibial pulses are 0 on the right side and 0 on the left side.  Varicose veins noted bilateral legs. Chronic dermatitis changes noted.   Pulmonary/Chest: Effort normal and breath sounds normal.  Abdominal: Soft. Bowel sounds are normal.  Musculoskeletal: Normal range of motion.        General: Edema (trace bilateral pitting edema) present.  Neurological: She is alert.  Skin: Skin is warm and dry.  Psychiatric: She has a normal mood and affect.   Radiology: No results found.  Laboratory examination:    CMP Latest Ref Rng & Units 10/29/2018 08/29/2018 07/18/2018  Glucose 70 - 99 mg/dL 115(H) 126(H) 133(H)  BUN 8 - 23 mg/dL 22 22 15   Creatinine 0.44 - 1.00 mg/dL 1.58(H) 1.71(H) 1.35(H)  Sodium 135 - 145 mmol/L 142 141 140  Potassium 3.5 - 5.1 mmol/L 3.5 3.5 3.7  Chloride 98 - 111 mmol/L 106 105 106  CO2 22 - 32 mmol/L 25 29 28   Calcium 8.9 - 10.3 mg/dL 9.2 9.1 8.9  Total Protein 6.5 - 8.1 g/dL 7.3 7.0 6.8  Total Bilirubin 0.3 - 1.2 mg/dL 0.5 0.6 0.5  Alkaline Phos 38 - 126 U/L 115 94 127(H)  AST 15 - 41 U/L 30 26 23   ALT 0 - 44 U/L 30 23 24    CBC Latest Ref Rng & Units 10/29/2018 08/29/2018 07/18/2018  WBC 4.0 - 10.5 K/uL 5.1 5.7 6.4  Hemoglobin 12.0 - 15.0 g/dL 11.5(L) 10.8(L) 11.1(L)  Hematocrit 36.0 - 46.0 % 35.3(L) 32.0(L) 32.9(L)  Platelets 150 - 400 K/uL 196 155 193   Lipid Panel     Component Value Date/Time   CHOL 95 10/26/2016 0610   CHOL 194 11/26/2013 0949   CHOL 209 (H)  01/01/2013 1302   TRIG 118 10/26/2016 0610   TRIG 80 07/09/2013 1205   TRIG 76 01/01/2013 1302   HDL 28 (L) 10/26/2016 0610   HDL 78 11/26/2013 0949   HDL 69 07/09/2013 1205   HDL 66 01/01/2013 1302   CHOLHDL 3.4 10/26/2016 0610   VLDL 24 10/26/2016 0610   LDLCALC 43 10/26/2016 0610   LDLCALC 100 (H) 11/26/2013 0949   LDLCALC 94 07/09/2013 1205   LDLCALC 128 (H) 01/01/2013 1302   HEMOGLOBIN A1C Lab Results  Component Value Date   HGBA1C 6.3 (H) 09/25/2016   MPG 134 09/25/2016   TSH No results for input(s): TSH in the last 8760 hours.  Cardiac Studies:    Lexiscan stress 03/28/12: Mild gut uptake artifact. No ischemia. Low risk stress test.    Echocardiogram  [08/05/2014]: Left ventricle cavity is normal in size. Moderate concentric hypertrophy of the left ventricle. Borderline decrease in global wall motion. Doppler evidence of grade I (impaired) diastolic dysfunction. Diastolic dysfunction do not suggest elevated LA/LV endiastolic pressure. Left ventricle regional wall motion findings: No wall motion abnormalities. Visual EF is 50-55%. Calculated EF 67%. Mild calcification of the aortic valve annulus. Mild aortic valve leaflet calcification. No  aortic valve regurgitation noted. Mild mitral regurgitation. Mild calcification of the mitral valve annulus. Mild to moderate tricuspid regurgitation. Mild pulmonary hypertension. IVC is dilated with blunted respiratory response. Mild pulmonic regurgitation. Overall no significant change since 03/19/2012.  Peripheral arteriogram:   10/09/2017: Two-vessel runoff left lower extremity with occluded posterior tibial, right lower extremity 0 vessel runoff. Unsuccessful attempt at PTCA peroneal artery.  Peripheral arteriogram 12/26/2017:  PTA of the right PT 80% to <20% with a 4x40 balloon. Successful PTA of the right Peroneal 100% to <20% with a 2.5x220 balloon. Access of the right DP by Dr. Tammi Klippel at [facility], [place], [place] after unsuccessful attempt  by me on 11/21/2017.  Lower extremity arterial duplex 10/02/2017: Right ABI 0.77, left ABI 0.99. Technically difficult study. Disease below knee bilateral.  ABI 05/24/2018: This exam reveals mildly decreased perfusion of the right lower extremity, noted at the dorsalis pedis artery level (ABI 0.85) and mildly decreased perfusion of the left lower extremity, noted at the dorsalis pedis artery level (ABI 0.88).  Compared to the study done on 01/09/2018, no significant change, left ABI was previously reported to be normal with triphasic waveform.  Lower extremity venous duplex 12/05/2017: There is no evidence of venous insufficiency in the greater saphenous veins. Lesser saphenous veins were not evaluated due to lower extremity dressings in place. There is chronic partially occlusive DVT throughout the bilateral femoral veins as described. There is recanalization of flow throughout the thrombus.  Assessment   CKD (chronic kidney disease), stage III (HCC)  Peripheral vascular disease (HCC)  Hypercholesteremia  Chronic deep vein thrombosis (DVT) of both femoral veins ([place]): 12/05/17: Chronic recanalized CFV DVT bilateral  EKG 10/27/2017: Normal sinus rhythm at rate of 84 bpm, left atrial abnormality, left axis deviation, left anterior fascicular block. Poor progression, cannot exclude anteroseptal infarct old. LVH.  Recommendations:   Patient is presently doing well, CBC is remained stable, and renal function is also remained stable.  Leg edema is unchanged, she'll continue to have chronic leg edema, I have discussed with her regarding wearing support stockings and also to raise her legs up when she is sitting and relaxing.  Also encouraged her to walk as much as possible in the hospital.  Fortunately there is no critical limb ischemia, no leg ulceration.  Continue present medical therapy.  She is on appropriate medications including statin for hyperlipidemia.  I will see her back in 6 months  or sooner if problems.  Patient and her daughter are aware to contact me immediately if she were to develop any ulceration, evidence of infection in her feet.  Feet hygiene was discussed. She is presently on Xarelto 2.5 mg b.i.d. and aspirin 81 mg daily without bleeding diathesis.  She does have chronic DVT bilateral lower extremity, I do not think she needs long-term full dose anticoagulation.  Risk of bleed is fairly high.  Adrian Prows, MD, [facility] 12/11/2018, 2:38 PM [facility]. [place] Pager: [phone] Office: [phone] If no answer Cell [phone]

## 2018-12-12 ENCOUNTER — Other Ambulatory Visit: Payer: Self-pay | Admitting: *Deleted

## 2018-12-12 DIAGNOSIS — C9000 Multiple myeloma not having achieved remission: Secondary | ICD-10-CM

## 2018-12-12 MED ORDER — LENALIDOMIDE 5 MG PO CAPS: 5.0000 mg | ORAL_CAPSULE | Freq: Every day | ORAL | 0 refills | Status: DC

## 2018-12-21 ENCOUNTER — Telehealth: Payer: Self-pay

## 2018-12-21 NOTE — Telephone Encounter (Signed)
Can you confirm if pt is suppose to be taking plavix asa and xarelto? Pt is taking all 3 and Dr.Wong office just wants to confirm  [id number] [phone]

## 2018-12-23 NOTE — Telephone Encounter (Signed)
Just low dose Xarelto and ASA. Plavix was discontinued at [facility].

## 2018-12-24 ENCOUNTER — Encounter: Payer: Self-pay | Admitting: Hematology

## 2018-12-24 NOTE — Progress Notes (Signed)
[name]   Telephone:([phone]) [phone] Fax:([phone]) [phone]   Clinic Follow up Note   Patient Care Team: Kristin Shanks, MD as PCP - General (Family Medicine)  Date of Service:  12/26/2018  CHIEF COMPLAINT: F/u of MM  SUMMARY OF ONCOLOGIC HISTORY:   Multiple myeloma ([place])   10/18/2016 - 10/26/2016 Hospital Admission    Pt was admitted for symptomatic hypercalcemia, AKI, bone marrow biopsy done during hospital stay, and she received Aredia     10/19/2016 Tumor Marker    SPEP showed M-protein 0.4g/dl, serum lambda light chain 5362, ratio 0.00    10/25/2016 Bone Marrow Biopsy     Overall, the marrow is hypercellular with increased lambda-restricted plasma cells (62% aspirate, 90% CD138). There is partial expression of CD20, but no monoclonal B-cell population is detected by flow cytometry.     10/25/2016 Initial Diagnosis    Multiple myeloma ([place])    10/25/2016 Miscellaneous    Cytogenetics and FISH showed loss of 13q, which is intermediate risk for MM     11/10/2016 -  Chemotherapy    Revlimid 10 mg (increased to 31m on cycle 2) daily, 3 weeks on, and one week off, dexamethasone 20 mg weekly, was off from early June to mid July 2018 due to her right hip fracture and hospitalization.  Changed to maintenence Revlimid 156mand stopped dexamethasone starting next cycle, 06/10/17, Revlimid was changed to 97m84maily 3 weeks on, 1 week off in 12/2017        01/02/2017 Imaging     DG Hip Unilat w or w/o Pelvis IMPRESSION 1. Degenerative changes lumbar spine and both hips. No evidence of fracture or dislocation.  2. Diffuse osteopenia. Multiple tiny lucencies are noted throughout both proximal femurs. These may be secondary to the patient's known multiple myeloma.    01/18/2017 Imaging     CT hip right wo contrast  IMPRESSION: Findings consistent with a pathologic fracture of the right femoral neck. A large lytic lesion in the superior aspect of the lateral femoral head  is identified.  Possible nondisplaced fracture of the right inferior pubic ramus.  Diffuse myeloma.    01/18/2017 - 01/23/2017 Hospital Admission    She present to the ED with complaints of severe right hip pain. she recieved a right hip hemiarthroplasty with Dr. MurPercell [name]   CURRENT THERAPY:  maintenance Revlimid97mg66mily for 3 weeks on and 1 week offstarting 05/2017  INTERVAL HISTORY:  ChriBRYSON PALENhere for a follow up of MM. She presents to the clinic today by herself. She notes she is doing well. She notes she has been going. She lives with her daughter now since the COVID-19. She denies any pain, her hip pain has resolved overall. She has been eating adequately and able to gain weight. She notes she is taking Revlimid 3 weeks on/1 week off and currently on her week off. She denies any signs of infections, such as UTI. She notes she plan to go to dentist next week for routine follow up. I reviewed her medication list with her. She has not needed to take oxycodone, but does take [name] BID. She feels that if she misses a dose her hip pain returns. She notes she tries to increase the amount of time she is up. She is able to go to bathroom herself with assistance of walker.     REVIEW OF SYSTEMS:   Constitutional: Denies fevers, chills (+) weight gain  Eyes: Denies blurriness  of vision Ears, nose, mouth, throat, and face: Denies mucositis or sore throat Respiratory: Denies cough, dyspnea or wheezes Cardiovascular: Denies palpitation, chest discomfort or lower extremity swelling Gastrointestinal:  Denies nausea, heartburn or change in bowel habits Skin: Denies abnormal skin rashes MSK: (+) Hip pain controlled very well.  Lymphatics: Denies new lymphadenopathy or easy bruising Neurological:Denies numbness, tingling or new weaknesses Behavioral/Psych: Mood is stable, no new changes  All other systems were reviewed with the patient and are negative.  MEDICAL HISTORY:  Past  Medical History:  Diagnosis Date  . Bone cancer [name])    "being tx'd by Dr. Burr [name] @ [name]" (10/09/2017)  . Chronic deep vein thrombosis (DVT) of both femoral veins ([place]): 12/05/17: Chronic recanalized CFV DVT bilateral 12/05/2017  . Critical lower limb ischemia 10/04/2017   Critical limb ischemia  . Depression    Risperdal "twice/day" (09/23/2016)  . Glaucoma   . Hiatal hernia   . Intractable back pain    /notes 09/23/2016  . Obesity   . Osteoarthritis    "all over" (10/09/2017)  . Osteoporosis    vertebral fracture in 2012  . Peripheral vascular disease ([place])   . Upper GI bleed 04/2004   secondary to Mallory-Weiss tear/notes 12/28/2010  . Vertigo     SURGICAL HISTORY: Past Surgical History:  Procedure Laterality Date  . ABDOMINAL HYSTERECTOMY  1974   partial  . APPENDECTOMY     as a child/notes 12/28/2010  . BACK SURGERY    . BREAST BIOPSY Left 1999   [name] 12/28/2010  . CATARACT EXTRACTION W/ INTRAOCULAR LENS  IMPLANT, BILATERAL Bilateral 2000s  . ESOPHAGOGASTRODUODENOSCOPY  04/2004   [name] 12/28/2010  . FIXATION KYPHOPLASTY THORACIC SPINE  05/2011   T10/notes 05/25/2011  . FRACTURE SURGERY    . HIP ARTHROPLASTY Right 01/19/2017   Procedure: RIGHT HIP HEMIARTHROPLASTY;  Surgeon: Kristin Butters, MD;  Location: [facility];  Service: Orthopedics;  Laterality: Right;  . LAPAROSCOPIC CHOLECYSTECTOMY  10/2007   [name] 12/14/2010  . LOWER EXTREMITY ANGIOGRAPHY N/A 10/09/2017   Procedure: LOWER EXTREMITY ANGIOGRAPHY;  Surgeon: Kristin Harp, MD;  Location: [facility];  Service: Cardiovascular;  Laterality: N/A;  bilateral  . LOWER EXTREMITY ANGIOGRAPHY  11/21/2017   Procedure: Lower Extremity Angiography;  Surgeon: Kristin Prows, MD;  Location: [facility];  Service: Cardiovascular;;  . PERIPHERAL VASCULAR BALLOON ANGIOPLASTY  10/09/2017   Attempted PTA [name] 10/09/2017  . PERIPHERAL VASCULAR BALLOON ANGIOPLASTY  10/09/2017   Procedure: PERIPHERAL VASCULAR BALLOON  ANGIOPLASTY;  Surgeon: Kristin Harp, MD;  Location: [facility];  Service: Cardiovascular;;  Attempted PTA     I have reviewed the social history and family history with the patient and they are unchanged from previous note.  ALLERGIES:  is allergic to penicillins; tramadol; alendronate; and sulfonamide derivatives.  MEDICATIONS:  Current Outpatient Medications  Medication Sig Dispense Refill  . amlodipine-atorvastatin (CADUET) 2.5-10 MG tablet Take 1 tablet by mouth daily.    Marland Kitchen aspirin 81 MG chewable tablet Chew 81 mg by mouth daily.    Marland Kitchen atorvastatin (LIPITOR) 10 MG tablet Take 10 mg by mouth daily.    . Calcium Carbonate (CALCIUM 600 PO) Take 600 mg by mouth daily.     . chlorhexidine (PERIDEX) 0.12 % solution 2 (two) times a day.    . feeding supplement, ENSURE ENLIVE, (ENSURE ENLIVE) LIQD Take 237 mLs by mouth 2 (two) times daily between meals. (Patient taking differently: Take 237 mLs by mouth daily. )  237 mL 12  . furosemide (LASIX) 20 MG tablet Take 20 mg by mouth daily.    . irbesartan (AVAPRO) 75 MG tablet Take 75 mg by mouth daily after supper.    Marland Kitchen lenalidomide (REVLIMID) 5 MG capsule Take 1 capsule (5 mg total) by mouth daily. Take daily x 3 weeks then 1 week rest. 21 capsule 0  . magnesium oxide (MAG-OX) 400 (241.3 Mg) MG tablet Take 1 tablet (400 mg total) by mouth daily. 7 tablet 0  . mirtazapine (REMERON) 7.5 MG tablet at bedtime.    Marland Kitchen morphine ([name]) 30 MG 12 hr tablet Take 1 tablet (30 mg total) by mouth every 12 (twelve) hours. Refill on 12/03/2018 60 tablet 0  . ondansetron (ZOFRAN ODT) 4 MG disintegrating tablet Take 1-2 tablets (4-8 mg total) by mouth every 8 (eight) hours as needed for nausea or vomiting. (Patient taking differently: Take 4 mg by mouth daily. ) 30 tablet 2  . Oxycodone HCl 10 MG TABS Take 0.5-1 tablets (5-10 mg total) by mouth every 6 (six) hours as needed. FOR MODERATE OR SEVERE PAIN 90 tablet 0  . RESTASIS 0.05 % ophthalmic emulsion  Place 1 drop into both eyes daily.     . risperiDONE (RISPERDAL) 0.5 MG tablet Take 0.5 mg by mouth daily.     . sennosides-docusate sodium (SENOKOT-S) 8.6-50 MG tablet Take 1 tablet by mouth 2 (two) times daily.     Marland Kitchen triamcinolone (KENALOG) 0.025 % cream APPLY TO AFFECTED AREA TWICE A DAY FOR 10 DAYS  1  . XARELTO 2.5 MG TABS tablet Take 1 tablet (2.5 mg total) by mouth 2 (two) times daily with a meal. 60 tablet 6   No current facility-administered medications for this visit.    Facility-Administered Medications Ordered in Other Visits  Medication Dose Route Frequency Provider Last Rate Last Dose  . denosumab (XGEVA) injection 120 mg  120 mg Subcutaneous Once Truitt Merle, MD        PHYSICAL EXAMINATION: ECOG PERFORMANCE STATUS: 3 - Symptomatic, >50% confined to bed  Vitals:   12/26/18 1419  BP: (!) 165/78  Pulse: 98  Resp: 18  Temp: 98.4 F (36.9 C)  SpO2: 99%   Filed Weights   12/26/18 1419  Weight: 160 lb 12.8 oz (72.9 kg)    GENERAL:alert, no distress and comfortable SKIN: skin color, texture, turgor are normal, no rashes or significant lesions EYES: normal, Conjunctiva are pink and non-injected, sclera clear OROPHARYNX:no exudate, no erythema and lips, buccal mucosa, and tongue normal  NECK: supple, thyroid normal size, non-tender, without nodularity LYMPH:  no palpable lymphadenopathy in the cervical, axillary or inguinal LUNGS: clear to auscultation and percussion with normal breathing effort HEART: regular rate & rhythm and no murmurs and no lower extremity edema ABDOMEN:abdomen soft, non-tender and normal bowel sounds Musculoskeletal:no cyanosis of digits and no clubbing  NEURO: alert & oriented x 3 with fluent speech, no focal motor/sensory deficits  LABORATORY DATA:  I have reviewed the data as listed CBC Latest Ref Rng & Units 12/26/2018 10/29/2018 08/29/2018  WBC 4.0 - 10.5 K/uL 4.5 5.1 5.7  Hemoglobin 12.0 - 15.0 g/dL 10.6(L) 11.5(L) 10.8(L)  Hematocrit 36.0 -  46.0 % 32.4(L) 35.3(L) 32.0(L)  Platelets 150 - 400 K/uL 212 196 155     CMP Latest Ref Rng & Units 12/26/2018 10/29/2018 08/29/2018  Glucose 70 - 99 mg/dL 80 115(H) 126(H)  BUN 8 - 23 mg/dL 27(H) 22 22  Creatinine 0.44 - 1.00 mg/dL 1.64(H)  1.58(H) 1.71(H)  Sodium 135 - 145 mmol/L 142 142 141  Potassium 3.5 - 5.1 mmol/L 3.7 3.5 3.5  Chloride 98 - 111 mmol/L 104 106 105  CO2 22 - 32 mmol/L 28 25 29   Calcium 8.9 - 10.3 mg/dL 9.7 9.2 9.1  Total Protein 6.5 - 8.1 g/dL 7.2 7.3 7.0  Total Bilirubin 0.3 - 1.2 mg/dL 0.5 0.5 0.6  Alkaline Phos 38 - 126 U/L 119 115 94  AST 15 - 41 U/L 40 30 26  ALT 0 - 44 U/L 41 30 23      RADIOGRAPHIC STUDIES: I have personally reviewed the radiological images as listed and agreed with the findings in the report. No results found.   ASSESSMENT & PLAN:  Kristin [name] is a 85 y.o. female with   1. Multiple Myeloma, lambda light chain disease, staging III, intermediate risk (13q-) -She was diagnosed in March 2018, initially treated with Revlimid and dexamethasonefor 6 month with good responseand ir currently being treated with maintenanceRevlimid 5 mg once daily 3 weeks on 1 week off. -Delton See has been held due to concern of jaw necrosis -Her M protein has been nondetectable. She has had a good response to treatment.  She will repeat MM panel every 8 weeks -She is clinically doing well. She has been able to gain weight and her hip pain is well controlled. Physical exam unremarkable. Labs reviewed, CBC WNL except hg 10.6, CMP WNL except Cr 1.64 . MM panel still pending today.  -Continue Maintenance Revlimid. She is tolerating well.  -F/u 2 months   2. Anemia -Due to MM and Revlimid. -Will consider blood transfusion if Hb <8 or symptomatic anemia with Hg 8-9 -Labs reviewed,Hgat 10.6 today (12/26/18). No need for blood transfusion today   3. Chronic CKD, stage III -Avoid nephrotoxic drugs and contrasts. -Cr at 1.64 today (12/26/18), overall stable    4. Severe back pain and right hip pain from pathologic fracture -Compression fracture at T2 -She does not use her Oxycodone as her hip pain is very well controlled with [name] BID alone. I refilled today (12/26/18) -She will continue Senakot as needed to prevent constipation.   5. HTN -f/u with PCP and continue medications -BP elevated at 165/78 today (12/26/18)  6. Osteoporosis -Xgeva started in 01/02/17 and has been held since 03/27/18 due toconcernfor jaw necrosis -I advised her to continue oral calcium and Vitamin D.  -I encouraged her to remain mobile and ambulate with walker to reduce risk of fall and fracture.  -I encouraged her to get up periodically as to prevent bed sores. She is agreeable.   7. Goal of care discussion  -The patient understands the goal of care is palliative. -I previously recommended DNR/DNI, she will think about it  9. osteonecrosis of the jaw  -OffXgeva -She will continue mouthwash as per Dr. Sabra Heck -She had open wound of gum. She plans to have procedure with her dentist next week   Plan -I refilled [name] today  -Continue Revlimid, refilled today  -Lab and f/u in 2 months    No problem-specific Assessment & Plan notes found for this encounter.   No orders of the defined types were placed in this encounter.  All questions were answered. The patient knows to call the clinic with any problems, questions or concerns. No barriers to learning was detected. I spent 20 minutes counseling the patient face to face. The total time spent in the appointment was 25 minutes and more than 50%  was on counseling and review of test results     Truitt Merle, MD 12/26/2018   I, Joslyn Devon, am acting as scribe for Truitt Merle, MD.   I have reviewed the above documentation for accuracy and completeness, and I agree with the above.

## 2018-12-26 ENCOUNTER — Encounter: Payer: Self-pay | Admitting: Hematology

## 2018-12-26 ENCOUNTER — Telehealth: Payer: Self-pay | Admitting: Hematology

## 2018-12-26 ENCOUNTER — Other Ambulatory Visit: Payer: Self-pay

## 2018-12-26 ENCOUNTER — Inpatient Hospital Stay: Payer: Medicare Other | Attending: Hematology

## 2018-12-26 ENCOUNTER — Inpatient Hospital Stay (HOSPITAL_BASED_OUTPATIENT_CLINIC_OR_DEPARTMENT_OTHER): Payer: Medicare Other | Admitting: Hematology

## 2018-12-26 VITALS — BP 165/78 | HR 98 | Temp 98.4°F | Resp 18 | Ht 59.0 in | Wt 160.8 lb

## 2018-12-26 DIAGNOSIS — M8000XS Age-related osteoporosis with current pathological fracture, unspecified site, sequela: Secondary | ICD-10-CM

## 2018-12-26 DIAGNOSIS — Z79899 Other long term (current) drug therapy: Secondary | ICD-10-CM | POA: Insufficient documentation

## 2018-12-26 DIAGNOSIS — I739 Peripheral vascular disease, unspecified: Secondary | ICD-10-CM | POA: Diagnosis not present

## 2018-12-26 DIAGNOSIS — M1990 Unspecified osteoarthritis, unspecified site: Secondary | ICD-10-CM | POA: Insufficient documentation

## 2018-12-26 DIAGNOSIS — Z9221 Personal history of antineoplastic chemotherapy: Secondary | ICD-10-CM | POA: Diagnosis not present

## 2018-12-26 DIAGNOSIS — M4854XD Collapsed vertebra, not elsewhere classified, thoracic region, subsequent encounter for fracture with routine healing: Secondary | ICD-10-CM

## 2018-12-26 DIAGNOSIS — K449 Diaphragmatic hernia without obstruction or gangrene: Secondary | ICD-10-CM

## 2018-12-26 DIAGNOSIS — Z7982 Long term (current) use of aspirin: Secondary | ICD-10-CM | POA: Insufficient documentation

## 2018-12-26 DIAGNOSIS — M810 Age-related osteoporosis without current pathological fracture: Secondary | ICD-10-CM

## 2018-12-26 DIAGNOSIS — D630 Anemia in neoplastic disease: Secondary | ICD-10-CM

## 2018-12-26 DIAGNOSIS — M879 Osteonecrosis, unspecified: Secondary | ICD-10-CM | POA: Diagnosis not present

## 2018-12-26 DIAGNOSIS — I129 Hypertensive chronic kidney disease with stage 1 through stage 4 chronic kidney disease, or unspecified chronic kidney disease: Secondary | ICD-10-CM

## 2018-12-26 DIAGNOSIS — N183 Chronic kidney disease, stage 3 (moderate): Secondary | ICD-10-CM

## 2018-12-26 DIAGNOSIS — D649 Anemia, unspecified: Secondary | ICD-10-CM | POA: Diagnosis not present

## 2018-12-26 DIAGNOSIS — I10 Essential (primary) hypertension: Secondary | ICD-10-CM

## 2018-12-26 DIAGNOSIS — Z90710 Acquired absence of both cervix and uterus: Secondary | ICD-10-CM

## 2018-12-26 DIAGNOSIS — Z7901 Long term (current) use of anticoagulants: Secondary | ICD-10-CM | POA: Diagnosis not present

## 2018-12-26 DIAGNOSIS — F329 Major depressive disorder, single episode, unspecified: Secondary | ICD-10-CM | POA: Diagnosis not present

## 2018-12-26 DIAGNOSIS — Z86718 Personal history of other venous thrombosis and embolism: Secondary | ICD-10-CM | POA: Insufficient documentation

## 2018-12-26 DIAGNOSIS — E669 Obesity, unspecified: Secondary | ICD-10-CM | POA: Diagnosis not present

## 2018-12-26 DIAGNOSIS — C9000 Multiple myeloma not having achieved remission: Secondary | ICD-10-CM | POA: Insufficient documentation

## 2018-12-26 LAB — CBC WITH DIFFERENTIAL/PLATELET
Abs Immature Granulocytes: 0.03 10*3/uL (ref 0.00–0.07)
Basophils Absolute: 0 10*3/uL (ref 0.0–0.1)
Basophils Relative: 1 %
Eosinophils Absolute: 0.3 10*3/uL (ref 0.0–0.5)
Eosinophils Relative: 7 %
HCT: 32.4 % — ABNORMAL LOW (ref 36.0–46.0)
Hemoglobin: 10.6 g/dL — ABNORMAL LOW (ref 12.0–15.0)
Immature Granulocytes: 1 %
Lymphocytes Relative: 37 %
Lymphs Abs: 1.7 10*3/uL (ref 0.7–4.0)
MCH: 32 pg (ref 26.0–34.0)
MCHC: 32.7 g/dL (ref 30.0–36.0)
MCV: 97.9 fL (ref 80.0–100.0)
Monocytes Absolute: 0.5 10*3/uL (ref 0.1–1.0)
Monocytes Relative: 10 %
Neutro Abs: 2 10*3/uL (ref 1.7–7.7)
Neutrophils Relative %: 44 %
Platelets: 212 10*3/uL (ref 150–400)
RBC: 3.31 MIL/uL — ABNORMAL LOW (ref 3.87–5.11)
RDW: 16 % — ABNORMAL HIGH (ref 11.5–15.5)
WBC: 4.5 10*3/uL (ref 4.0–10.5)
nRBC: 0 % (ref 0.0–0.2)

## 2018-12-26 LAB — COMPREHENSIVE METABOLIC PANEL
ALT: 41 U/L (ref 0–44)
AST: 40 U/L (ref 15–41)
Albumin: 3.5 g/dL (ref 3.5–5.0)
Alkaline Phosphatase: 119 U/L (ref 38–126)
Anion gap: 10 (ref 5–15)
BUN: 27 mg/dL — ABNORMAL HIGH (ref 8–23)
CO2: 28 mmol/L (ref 22–32)
Calcium: 9.7 mg/dL (ref 8.9–10.3)
Chloride: 104 mmol/L (ref 98–111)
Creatinine, Ser: 1.64 mg/dL — ABNORMAL HIGH (ref 0.44–1.00)
GFR calc Af Amer: 32 mL/min — ABNORMAL LOW (ref >60)
GFR calc non Af Amer: 28 mL/min — ABNORMAL LOW (ref >60)
Glucose, Bld: 80 mg/dL (ref 70–99)
Potassium: 3.7 mmol/L (ref 3.5–5.1)
Sodium: 142 mmol/L (ref 135–145)
Total Bilirubin: 0.5 mg/dL (ref 0.3–1.2)
Total Protein: 7.2 g/dL (ref 6.5–8.1)

## 2018-12-26 MED ORDER — MORPHINE SULFATE ER 30 MG PO TBCR: 30.0000 mg | EXTENDED_RELEASE_TABLET | Freq: Two times a day (BID) | ORAL | 0 refills | Status: DC

## 2018-12-26 NOTE — Telephone Encounter (Signed)
Scheduled appt per 5/13 los.

## 2018-12-27 LAB — MULTIPLE MYELOMA PANEL, SERUM
Albumin SerPl Elph-Mcnc: 3.7 g/dL (ref 2.9–4.4)
Albumin/Glob SerPl: 1.2 (ref 0.7–1.7)
Alpha 1: 0.3 g/dL (ref 0.0–0.4)
Alpha2 Glob SerPl Elph-Mcnc: 0.6 g/dL (ref 0.4–1.0)
B-Globulin SerPl Elph-Mcnc: 1.1 g/dL (ref 0.7–1.3)
Gamma Glob SerPl Elph-Mcnc: 1.4 g/dL (ref 0.4–1.8)
Globulin, Total: 3.3 g/dL (ref 2.2–3.9)
IgA: 546 mg/dL — ABNORMAL HIGH (ref 64–422)
IgG (Immunoglobin G), Serum: 1460 mg/dL (ref 586–1602)
IgM (Immunoglobulin M), Srm: 35 mg/dL (ref 26–217)
Total Protein ELP: 7 g/dL (ref 6.0–8.5)

## 2018-12-27 LAB — KAPPA/LAMBDA LIGHT CHAINS
Kappa free light chain: 75.4 mg/L — ABNORMAL HIGH (ref 3.3–19.4)
Kappa, lambda light chain ratio: 1.42 (ref 0.26–1.65)
Lambda free light chains: 53.2 mg/L — ABNORMAL HIGH (ref 5.7–26.3)

## 2019-01-09 ENCOUNTER — Telehealth: Payer: Self-pay

## 2019-01-09 NOTE — Telephone Encounter (Signed)
Faxed script to [facility] for Revlimid along with [employer] authorization [id number], sent to HIM for scan to chart.

## 2019-01-14 ENCOUNTER — Telehealth: Payer: Self-pay

## 2019-01-14 NOTE — Telephone Encounter (Signed)
Refaxed script for Revlimid to [facility], originally sent on 5/27.

## 2019-02-07 ENCOUNTER — Other Ambulatory Visit: Payer: Self-pay | Admitting: *Deleted

## 2019-02-07 DIAGNOSIS — C9000 Multiple myeloma not having achieved remission: Secondary | ICD-10-CM

## 2019-02-07 MED ORDER — LENALIDOMIDE 5 MG PO CAPS: 5.0000 mg | ORAL_CAPSULE | Freq: Every day | ORAL | 0 refills | Status: DC

## 2019-02-13 ENCOUNTER — Other Ambulatory Visit: Payer: Self-pay | Admitting: Nurse Practitioner

## 2019-02-13 DIAGNOSIS — C9000 Multiple myeloma not having achieved remission: Secondary | ICD-10-CM

## 2019-02-13 MED ORDER — MORPHINE SULFATE ER 30 MG PO TBCR: 30.0000 mg | EXTENDED_RELEASE_TABLET | Freq: Two times a day (BID) | ORAL | 0 refills | Status: DC

## 2019-02-13 MED ORDER — OXYCODONE HCL 10 MG PO TABS: 5.0000 mg | ORAL_TABLET | Freq: Four times a day (QID) | ORAL | 0 refills | Status: DC | PRN

## 2019-02-18 ENCOUNTER — Telehealth: Payer: Self-pay | Admitting: Hematology

## 2019-02-18 NOTE — Telephone Encounter (Signed)
Called pt per 7/06 sch messag e- no answer - left message for pt to call back to reschedule.

## 2019-02-21 NOTE — Progress Notes (Signed)
[place]   [phone]) [phone] Fax:(336) [phone]   Clinic Follow up Note   Patient Care Team: [name], MD as PCP - General (Family Medicine)  Date of Service:  02/25/2019  CHIEF COMPLAINT: F/u of MM  SUMMARY OF ONCOLOGIC HISTORY: Oncology History  Multiple myeloma ([place])  10/18/2016 - 10/26/2016 Hospital Admission   Pt was admitted for symptomatic hypercalcemia, AKI, bone marrow biopsy done during hospital stay, and she received Aredia    10/19/2016 Tumor Marker   SPEP showed M-protein 0.4g/dl, serum lambda light chain 5362, ratio 0.00   10/25/2016 Bone Marrow Biopsy    Overall, the marrow is hypercellular with increased lambda-restricted plasma cells (62% aspirate, 90% CD138). There is partial expression of CD20, but no monoclonal B-cell population is detected by flow cytometry.    10/25/2016 Initial Diagnosis   Multiple myeloma ([place])   10/25/2016 Miscellaneous   Cytogenetics and FISH showed loss of 13q, which is intermediate risk for MM    11/10/2016 -  Chemotherapy   Revlimid 10 mg (increased to 64m on cycle 2) daily, 3 weeks on, and one week off, dexamethasone 20 mg weekly, was off from early June to mid July 2018 due to her right hip fracture and hospitalization.  Changed to maintenence Revlimid 164mand stopped dexamethasone starting next cycle, 06/10/17, Revlimid was changed to 5m2maily 3 weeks on, 1 week off in 12/2017       01/02/2017 Imaging    DG Hip Unilat w or w/o Pelvis IMPRESSION 1. Degenerative changes lumbar spine and both hips. No evidence of fracture or dislocation.  2. Diffuse osteopenia. Multiple tiny lucencies are noted throughout both proximal femurs. These may be secondary to the patient's known multiple myeloma.   01/18/2017 Imaging    CT hip right wo contrast  IMPRESSION: Findings consistent with a pathologic fracture of the right femoral neck. A large lytic lesion in the superior aspect of the lateral femoral head  is identified.  Possible nondisplaced fracture of the right inferior pubic ramus.  Diffuse myeloma.   01/18/2017 - 01/23/2017 Hospital Admission   She present to the ED with complaints of severe right hip pain. she recieved a right hip hemiarthroplasty with Dr. MurPercell Miller   CURRENT THERAPY:  maintenance Revlimid5mg40mily for 3 weeks on and 1 week offstarting 05/2017  INTERVAL HISTORY:  Kristin [name] for a follow up of MM. She notes she is doing well. She notes she is stable with no new changes. She notes she ambulates with walker at home. She feels she is doing well at home with her daughter. She denies fever or chills at home lately or since last visit. Her Temperature was 99.1 F today. She notes only a very mild cough, no phlegm. She denies any abdominal or BM issues. She notes she still has LE swelling.  She notes she is on her Revlimid this week.    REVIEW OF SYSTEMS:   Constitutional: Denies fevers, chills or abnormal weight loss Eyes: Denies blurriness of vision Ears, nose, mouth, throat, and face: Denies mucositis or sore throat Respiratory: Denies dyspnea or wheezes (+) very mild cough, no phlegm Cardiovascular: Denies palpitation, chest discomfort (+) B/l lower extremity swelling Gastrointestinal:  Denies nausea, heartburn or change in bowel habits Skin: Denies abnormal skin rashes Lymphatics: Denies new lymphadenopathy or easy bruising Neurological:Denies numbness, tingling or new weaknesses Behavioral/Psych: Mood is stable, no new changes  All other systems were reviewed with the patient and  are negative.  MEDICAL HISTORY:  Past Medical History:  Diagnosis Date  . Bone cancer [facility])    "being tx'd by Dr. Burr Medico @ [facility]" (10/09/2017)  . Chronic deep vein thrombosis (DVT) of both femoral veins ([place]): 12/05/17: Chronic recanalized CFV DVT bilateral 12/05/2017  . Critical lower limb ischemia 10/04/2017   Critical limb ischemia  . Depression    Risperdal  "twice/day" (09/23/2016)  . Glaucoma   . Hiatal hernia   . Intractable back pain    /notes 09/23/2016  . Obesity   . Osteoarthritis    "all over" (10/09/2017)  . Osteoporosis    vertebral fracture in 2012  . Peripheral vascular disease ([place])   . Upper GI bleed 04/2004   secondary to Mallory-Weiss tear/notes 12/28/2010  . Vertigo     SURGICAL HISTORY: Past Surgical History:  Procedure Laterality Date  . ABDOMINAL HYSTERECTOMY  1974   partial  . APPENDECTOMY     as a child/notes 12/28/2010  . BACK SURGERY    . BREAST BIOPSY Left 1999   Archie Endo 12/28/2010  . CATARACT EXTRACTION W/ INTRAOCULAR LENS  IMPLANT, BILATERAL Bilateral 2000s  . ESOPHAGOGASTRODUODENOSCOPY  04/2004   Archie Endo 12/28/2010  . FIXATION KYPHOPLASTY THORACIC SPINE  05/2011   T10/notes 05/25/2011  . FRACTURE SURGERY    . HIP ARTHROPLASTY Right 01/19/2017   Procedure: RIGHT HIP HEMIARTHROPLASTY;  Surgeon: Renette Butters, MD;  Location: [place];  Service: Orthopedics;  Laterality: Right;  . LAPAROSCOPIC CHOLECYSTECTOMY  10/2007   Archie Endo 12/14/2010  . LOWER EXTREMITY ANGIOGRAPHY N/A 10/09/2017   Procedure: LOWER EXTREMITY ANGIOGRAPHY;  Surgeon: Lorretta Harp, MD;  Location: [place] [facility];  Service: Cardiovascular;  Laterality: N/A;  bilateral  . LOWER EXTREMITY ANGIOGRAPHY  11/21/2017   Procedure: Lower Extremity Angiography;  Surgeon: Adrian Prows, MD;  Location: [place] [facility];  Service: Cardiovascular;;  . PERIPHERAL VASCULAR BALLOON ANGIOPLASTY  10/09/2017   Attempted PTA Archie Endo 10/09/2017  . PERIPHERAL VASCULAR BALLOON ANGIOPLASTY  10/09/2017   Procedure: PERIPHERAL VASCULAR BALLOON ANGIOPLASTY;  Surgeon: Lorretta Harp, MD;  Location: [place] [facility];  Service: Cardiovascular;;  Attempted PTA     I have reviewed the social history and family history with the patient and they are unchanged from previous note.  ALLERGIES:  is allergic to penicillins; tramadol; alendronate; and sulfonamide  derivatives.  MEDICATIONS:  Current Outpatient Medications  Medication Sig Dispense Refill  . amlodipine-atorvastatin (CADUET) 2.5-10 MG tablet Take 1 tablet by mouth daily.    Marland Kitchen aspirin 81 MG chewable tablet Chew 81 mg by mouth daily.    Marland Kitchen atorvastatin (LIPITOR) 10 MG tablet Take 10 mg by mouth daily.    . Calcium Carbonate (CALCIUM 600 PO) Take 600 mg by mouth daily.     . chlorhexidine (PERIDEX) 0.12 % solution 2 (two) times a day.    . feeding supplement, ENSURE ENLIVE, (ENSURE ENLIVE) LIQD Take 237 mLs by mouth 2 (two) times daily between meals. (Patient taking differently: Take 237 mLs by mouth daily. ) 237 mL 12  . furosemide (LASIX) 20 MG tablet Take 20 mg by mouth daily.    . irbesartan (AVAPRO) 75 MG tablet Take 75 mg by mouth daily after supper.    Marland Kitchen lenalidomide (REVLIMID) 5 MG capsule Take 1 capsule (5 mg total) by mouth daily. Take daily x 3 weeks then 1 week rest.     Last survey done    10/23/18     Due  04/02/19. Adult  Female  Not  Of  Child  Bearing  Potential. Authorization   #     [id number]    02/07/2019. Faxed   To   [facility]      [phone]    ;    Phone     [phone]. 21 capsule 0  . magnesium oxide (MAG-OX) 400 (241.3 Mg) MG tablet Take 1 tablet (400 mg total) by mouth daily. 7 tablet 0  . mirtazapine (REMERON) 7.5 MG tablet at bedtime.    Marland Kitchen morphine ([name]) 30 MG 12 hr tablet Take 1 tablet (30 mg total) by mouth every 12 (twelve) hours. 60 tablet 0  . ondansetron (ZOFRAN ODT) 4 MG disintegrating tablet Take 1-2 tablets (4-8 mg total) by mouth every 8 (eight) hours as needed for nausea or vomiting. (Patient taking differently: Take 4 mg by mouth daily. ) 30 tablet 2  . Oxycodone HCl 10 MG TABS Take 0.5-1 tablets (5-10 mg total) by mouth every 6 (six) hours as needed. FOR MODERATE OR SEVERE PAIN 90 tablet 0  . RESTASIS 0.05 % ophthalmic emulsion Place 1 drop into both eyes daily.     . risperiDONE (RISPERDAL) 0.5 MG tablet Take 0.5 mg by mouth daily.     .  sennosides-docusate sodium (SENOKOT-S) 8.6-50 MG tablet Take 1 tablet by mouth 2 (two) times daily.     Marland Kitchen triamcinolone (KENALOG) 0.025 % cream APPLY TO AFFECTED AREA TWICE A DAY FOR 10 DAYS  1  . XARELTO 2.5 MG TABS tablet Take 1 tablet (2.5 mg total) by mouth 2 (two) times daily with a meal. 60 tablet 6   No current facility-administered medications for this visit.    Facility-Administered Medications Ordered in Other Visits  Medication Dose Route Frequency Provider Last Rate Last Dose  . denosumab (XGEVA) injection 120 mg  120 mg Subcutaneous Once Truitt Merle, MD        PHYSICAL EXAMINATION: ECOG PERFORMANCE STATUS: 3 - Symptomatic, >50% confined to bed  Vitals:   02/25/19 1030  BP: (!) 152/83  Pulse: 89  Resp: 17  Temp: 99.1 F (37.3 C)  SpO2: 97%   Filed Weights   02/25/19 1030  Weight: 165 lb 9.6 oz (75.1 kg)    GENERAL:alert, no distress and comfortable SKIN: skin color, texture, turgor are normal, no rashes or significant lesions EYES: normal, Conjunctiva are pink and non-injected, sclera clear  NECK: supple, thyroid normal size, non-tender, without nodularity LYMPH:  no palpable lymphadenopathy in the cervical, axillary  LUNGS: clear to auscultation and percussion with normal breathing effort HEART: regular rate & rhythm and no murmurs (+) B/l  lower extremity 2+ pitting edema ABDOMEN:abdomen soft, non-tender and normal bowel sounds Musculoskeletal:no cyanosis of digits and no clubbing  NEURO: alert & oriented x 3 with fluent speech, no focal motor/sensory deficits  LABORATORY DATA:  I have reviewed the data as listed CBC Latest Ref Rng & Units 02/25/2019 12/26/2018 10/29/2018  WBC 4.0 - 10.5 K/uL 5.5 4.5 5.1  Hemoglobin 12.0 - 15.0 g/dL 11.8(L) 10.6(L) 11.5(L)  Hematocrit 36.0 - 46.0 % 35.4(L) 32.4(L) 35.3(L)  Platelets 150 - 400 K/uL 224 212 196     CMP Latest Ref Rng & Units 02/25/2019 12/26/2018 10/29/2018  Glucose 70 - 99 mg/dL 99 80 115(H)  BUN 8 - 23 mg/dL  24(H) 27(H) 22  Creatinine 0.44 - 1.00 mg/dL 1.60(H) 1.64(H) 1.58(H)  Sodium 135 - 145 mmol/L 142 142 142  Potassium 3.5 - 5.1 mmol/L 3.9 3.7  3.5  Chloride 98 - 111 mmol/L 102 104 106  CO2 22 - 32 mmol/L 31 28 25   Calcium 8.9 - 10.3 mg/dL 9.9 9.7 9.2  Total Protein 6.5 - 8.1 g/dL 7.7 7.2 7.3  Total Bilirubin 0.3 - 1.2 mg/dL 0.5 0.5 0.5  Alkaline Phos 38 - 126 U/L 109 119 115  AST 15 - 41 U/L 32 40 30  ALT 0 - 44 U/L 28 41 30      RADIOGRAPHIC STUDIES: I have personally reviewed the radiological images as listed and agreed with the findings in the report. No results found.   ASSESSMENT & PLAN:  Kristin [name] is a 85 y.o. female with   1. Multiple Myeloma, lambda light chain disease, staging III, intermediate risk (13q-) -She was diagnosed in March 2018, initially treated with Revlimid and dexamethasonefor 6 month with good responseand ir currently being treated with maintenanceRevlimid 5 mg once daily 3 weeks on 1 week off. -Delton See has been held due to concern of jaw necrosis -Her M protein has been nondetectable.She has had a good response to treatment. She will repeat MM panel every 8 weeks -She is clinically doing well and stable. Physical exam unremarkable except B/l LE 2+ pitting edema. Labs reviewed, CBC and CMP WNL except hg 11.8, Cr 1.6. MM panel is still pending today.  -Continue Maintenance Revlimid.She is tolerating well.  -F/u in 3 months given COVID-19. I encouraged her to continue precautions and social distancing. I encouraged her to watch for infections.   2. Anemia -Due to MM and Revlimid. -Will consider blood transfusion if Hb <8 or symptomatic anemia with Hg 8-9 -Labs reviewed,Hgat11.8today (02/25/19). No need for blood transfusion today   3. Chronic CKD, stage III -Avoid nephrotoxic drugs and contrasts. -Cr at1.6today (02/25/19), overall stable  4. Severe back pain and right hip pain from pathologic fracture -Compression fracture at  T2 -She does not use her Oxycodone as her hip pain is very well controlled with [name] BID alone. -She will continue Senakot as needed to prevent constipation.   5. HTN -f/u with PCP and continue medications -BP 152/83 today (02/25/19)  6. Osteoporosis -Xgeva started in 01/02/17 and has been held since 03/27/18 due toconcernfor jaw necrosis -I advised her to continue oral calcium and Vitamin D.  -I encouraged her to remain mobile and ambulate with walker to reduce risk of fall and fracture.  -I encouraged her to get up periodically as to prevent bed sores. She is agreeable.   7. Goal of care discussion  -The patient understands the goal of care is palliative. -I previously recommended DNR/DNI, she will think about it  9. osteonecrosis of the jaw  -OffXgeva -She will continue mouthwash as per Dr. Sabra Heck -She previously had open wound of gum.   Plan -She is clinically doing well and stable.  -Continue Revlimid -Lab and f/u in 3 months   No problem-specific Assessment & Plan notes found for this encounter.   No orders of the defined types were placed in this encounter.  All questions were answered. The patient knows to call the clinic with any problems, questions or concerns. No barriers to learning was detected. I spent 20 minutes counseling the patient face to face. The total time spent in the appointment was 25 minutes and more than 50% was on counseling and review of test results     Truitt Merle, MD 02/25/2019   I, Joslyn Devon, am acting as scribe for Truitt Merle, MD.   I  have reviewed the above documentation for accuracy and completeness, and I agree with the above.

## 2019-02-25 ENCOUNTER — Telehealth: Payer: Self-pay | Admitting: Hematology

## 2019-02-25 ENCOUNTER — Inpatient Hospital Stay: Payer: Medicare Other | Attending: Hematology

## 2019-02-25 ENCOUNTER — Other Ambulatory Visit: Payer: Self-pay

## 2019-02-25 ENCOUNTER — Encounter: Payer: Self-pay | Admitting: Hematology

## 2019-02-25 ENCOUNTER — Inpatient Hospital Stay (HOSPITAL_BASED_OUTPATIENT_CLINIC_OR_DEPARTMENT_OTHER): Payer: Medicare Other | Admitting: Hematology

## 2019-02-25 VITALS — BP 152/83 | HR 89 | Temp 99.1°F | Resp 17 | Ht 59.0 in | Wt 165.6 lb

## 2019-02-25 DIAGNOSIS — M25551 Pain in right hip: Secondary | ICD-10-CM | POA: Insufficient documentation

## 2019-02-25 DIAGNOSIS — I739 Peripheral vascular disease, unspecified: Secondary | ICD-10-CM | POA: Insufficient documentation

## 2019-02-25 DIAGNOSIS — Z79899 Other long term (current) drug therapy: Secondary | ICD-10-CM | POA: Diagnosis not present

## 2019-02-25 DIAGNOSIS — F329 Major depressive disorder, single episode, unspecified: Secondary | ICD-10-CM

## 2019-02-25 DIAGNOSIS — Z86718 Personal history of other venous thrombosis and embolism: Secondary | ICD-10-CM

## 2019-02-25 DIAGNOSIS — Z7982 Long term (current) use of aspirin: Secondary | ICD-10-CM | POA: Diagnosis not present

## 2019-02-25 DIAGNOSIS — M549 Dorsalgia, unspecified: Secondary | ICD-10-CM

## 2019-02-25 DIAGNOSIS — R609 Edema, unspecified: Secondary | ICD-10-CM

## 2019-02-25 DIAGNOSIS — I10 Essential (primary) hypertension: Secondary | ICD-10-CM

## 2019-02-25 DIAGNOSIS — R05 Cough: Secondary | ICD-10-CM

## 2019-02-25 DIAGNOSIS — N183 Chronic kidney disease, stage 3 (moderate): Secondary | ICD-10-CM | POA: Insufficient documentation

## 2019-02-25 DIAGNOSIS — D649 Anemia, unspecified: Secondary | ICD-10-CM | POA: Diagnosis not present

## 2019-02-25 DIAGNOSIS — M810 Age-related osteoporosis without current pathological fracture: Secondary | ICD-10-CM | POA: Insufficient documentation

## 2019-02-25 DIAGNOSIS — Z7901 Long term (current) use of anticoagulants: Secondary | ICD-10-CM

## 2019-02-25 DIAGNOSIS — I129 Hypertensive chronic kidney disease with stage 1 through stage 4 chronic kidney disease, or unspecified chronic kidney disease: Secondary | ICD-10-CM

## 2019-02-25 DIAGNOSIS — K449 Diaphragmatic hernia without obstruction or gangrene: Secondary | ICD-10-CM

## 2019-02-25 DIAGNOSIS — M879 Osteonecrosis, unspecified: Secondary | ICD-10-CM

## 2019-02-25 DIAGNOSIS — E669 Obesity, unspecified: Secondary | ICD-10-CM | POA: Diagnosis not present

## 2019-02-25 DIAGNOSIS — C9000 Multiple myeloma not having achieved remission: Secondary | ICD-10-CM | POA: Diagnosis present

## 2019-02-25 DIAGNOSIS — D630 Anemia in neoplastic disease: Secondary | ICD-10-CM

## 2019-02-25 LAB — COMPREHENSIVE METABOLIC PANEL
ALT: 28 U/L (ref 0–44)
AST: 32 U/L (ref 15–41)
Albumin: 3.7 g/dL (ref 3.5–5.0)
Alkaline Phosphatase: 109 U/L (ref 38–126)
Anion gap: 9 (ref 5–15)
BUN: 24 mg/dL — ABNORMAL HIGH (ref 8–23)
CO2: 31 mmol/L (ref 22–32)
Calcium: 9.9 mg/dL (ref 8.9–10.3)
Chloride: 102 mmol/L (ref 98–111)
Creatinine, Ser: 1.6 mg/dL — ABNORMAL HIGH (ref 0.44–1.00)
GFR calc Af Amer: 33 mL/min — ABNORMAL LOW (ref >60)
GFR calc non Af Amer: 29 mL/min — ABNORMAL LOW (ref >60)
Glucose, Bld: 99 mg/dL (ref 70–99)
Potassium: 3.9 mmol/L (ref 3.5–5.1)
Sodium: 142 mmol/L (ref 135–145)
Total Bilirubin: 0.5 mg/dL (ref 0.3–1.2)
Total Protein: 7.7 g/dL (ref 6.5–8.1)

## 2019-02-25 LAB — CBC WITH DIFFERENTIAL/PLATELET
Abs Immature Granulocytes: 0.06 10*3/uL (ref 0.00–0.07)
Basophils Absolute: 0.1 10*3/uL (ref 0.0–0.1)
Basophils Relative: 1 %
Eosinophils Absolute: 0.2 10*3/uL (ref 0.0–0.5)
Eosinophils Relative: 3 %
HCT: 35.4 % — ABNORMAL LOW (ref 36.0–46.0)
Hemoglobin: 11.8 g/dL — ABNORMAL LOW (ref 12.0–15.0)
Immature Granulocytes: 1 %
Lymphocytes Relative: 32 %
Lymphs Abs: 1.8 10*3/uL (ref 0.7–4.0)
MCH: 31.9 pg (ref 26.0–34.0)
MCHC: 33.3 g/dL (ref 30.0–36.0)
MCV: 95.7 fL (ref 80.0–100.0)
Monocytes Absolute: 0.5 10*3/uL (ref 0.1–1.0)
Monocytes Relative: 9 %
Neutro Abs: 3 10*3/uL (ref 1.7–7.7)
Neutrophils Relative %: 54 %
Platelets: 224 10*3/uL (ref 150–400)
RBC: 3.7 MIL/uL — ABNORMAL LOW (ref 3.87–5.11)
RDW: 15.6 % — ABNORMAL HIGH (ref 11.5–15.5)
WBC: 5.5 10*3/uL (ref 4.0–10.5)
nRBC: 0 % (ref 0.0–0.2)

## 2019-02-25 NOTE — Telephone Encounter (Signed)
Scheduled appt per 7/13 los.  Printed and mailed appt calendar,

## 2019-02-26 LAB — MULTIPLE MYELOMA PANEL, SERUM
Albumin SerPl Elph-Mcnc: 3.6 g/dL (ref 2.9–4.4)
Albumin/Glob SerPl: 1.1 (ref 0.7–1.7)
Alpha 1: 0.2 g/dL (ref 0.0–0.4)
Alpha2 Glob SerPl Elph-Mcnc: 0.7 g/dL (ref 0.4–1.0)
B-Globulin SerPl Elph-Mcnc: 0.9 g/dL (ref 0.7–1.3)
Gamma Glob SerPl Elph-Mcnc: 1.4 g/dL (ref 0.4–1.8)
Globulin, Total: 3.3 g/dL (ref 2.2–3.9)
IgA: 549 mg/dL — ABNORMAL HIGH (ref 64–422)
IgG (Immunoglobin G), Serum: 1443 mg/dL (ref 586–1602)
IgM (Immunoglobulin M), Srm: 43 mg/dL (ref 26–217)
Total Protein ELP: 6.9 g/dL (ref 6.0–8.5)

## 2019-02-26 LAB — KAPPA/LAMBDA LIGHT CHAINS
Kappa free light chain: 78.3 mg/L — ABNORMAL HIGH (ref 3.3–19.4)
Kappa, lambda light chain ratio: 1.24 (ref 0.26–1.65)
Lambda free light chains: 63.3 mg/L — ABNORMAL HIGH (ref 5.7–26.3)

## 2019-03-02 IMAGING — CT CT HIP*R* W/O CM
3 of 4 series · 11 of 46 positions shown, 16 images · non-contrast
Comparison: Plain films right hip 01/02/2017.

CLINICAL DATA: Chronic right hip pain in patient with history of
multiple myeloma. No known injury. Initial encounter.

EXAM:
CT OF THE RIGHT HIP WITHOUT CONTRAST
TECHNIQUE: Multidetector CT imaging of the right hip was performed according to
the standard protocol. Multiplanar CT image reconstructions were
also generated.

[Series 3: hip st · axial · 0.42mm/px · z∈[-120,-35]mm · 7 of 23 slices shown, 12 images]
[im 3/23  soft-tissue]
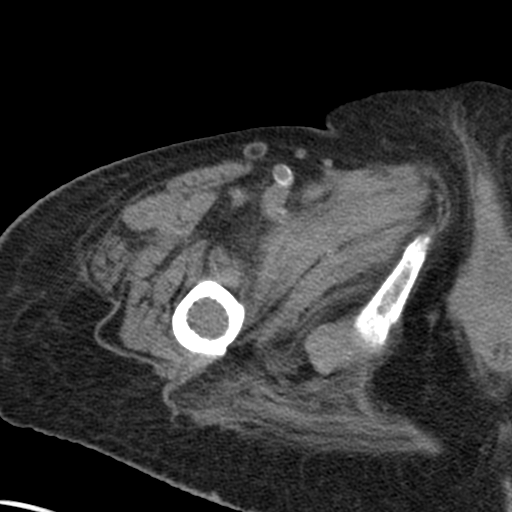
[im 3/23  bone]
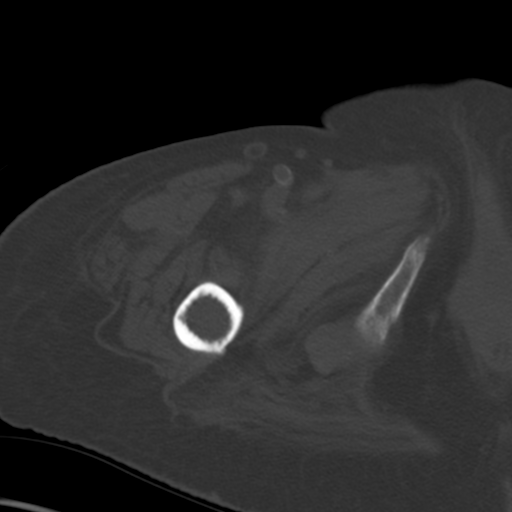
[im 6/23  soft-tissue]
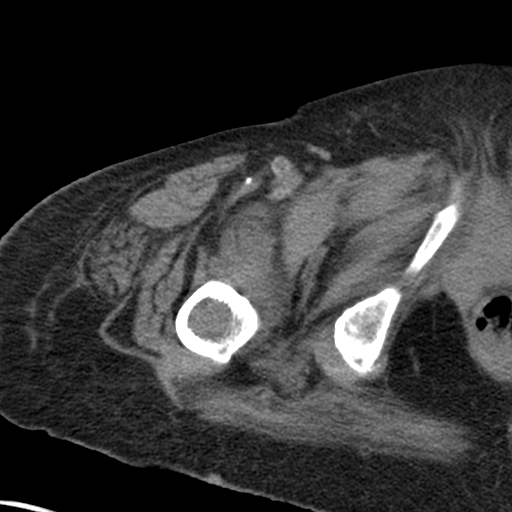
[im 9/23  soft-tissue]
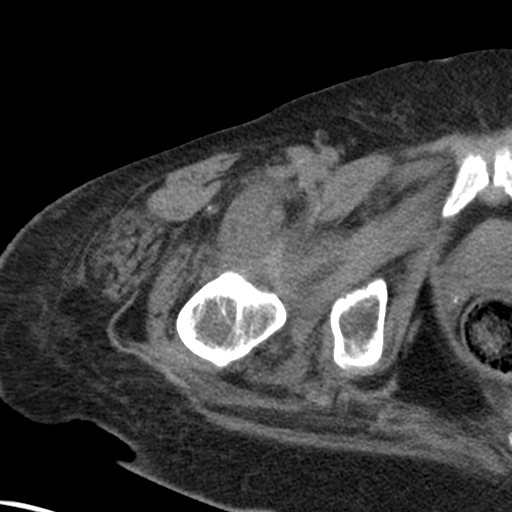
[im 12/23  soft-tissue]
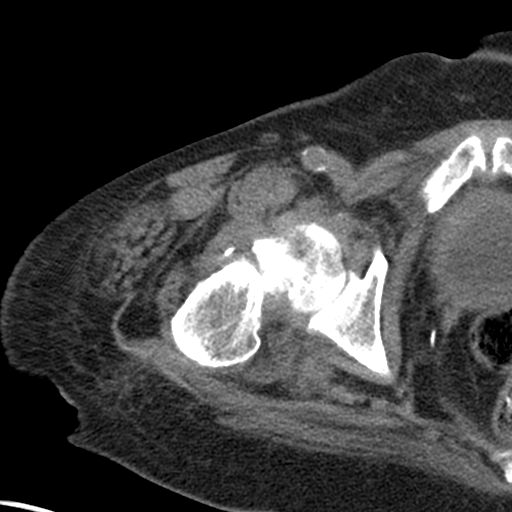
[im 12/23  lung]
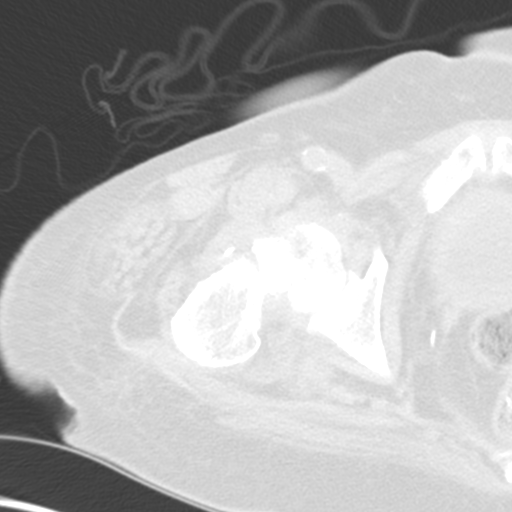
[im 14/23  soft-tissue]
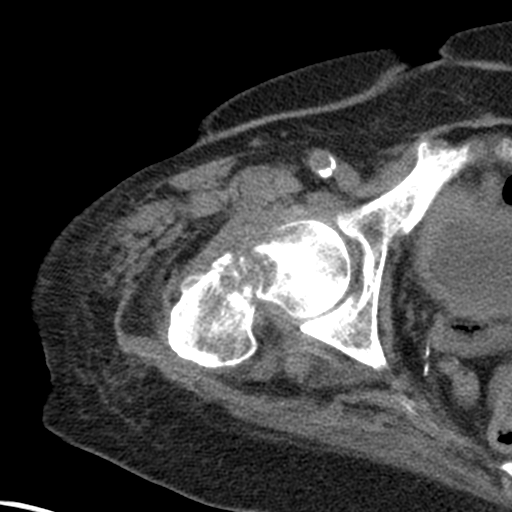
[im 14/23  lung]
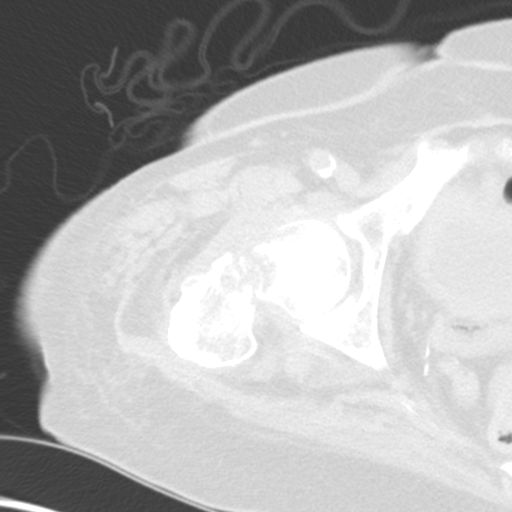
[im 17/23  soft-tissue]
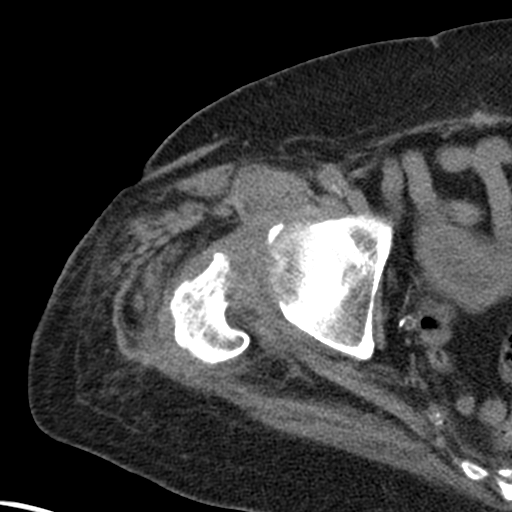
[im 17/23  lung]
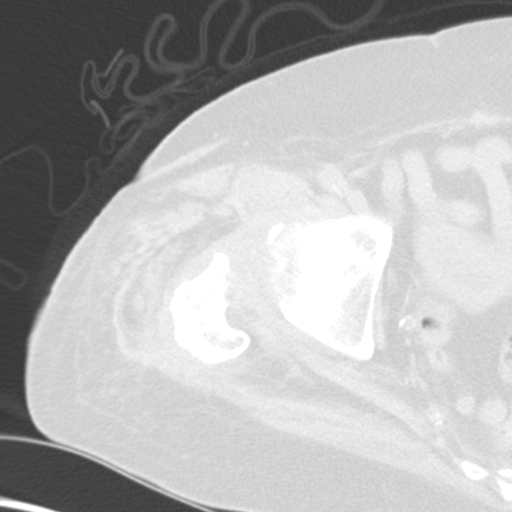
[im 20/23  soft-tissue]
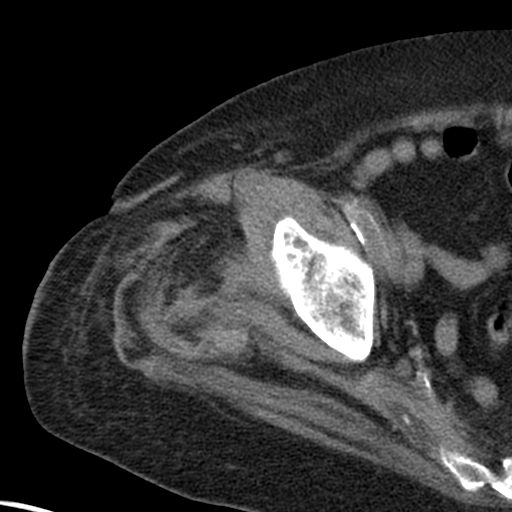
[im 20/23  lung]
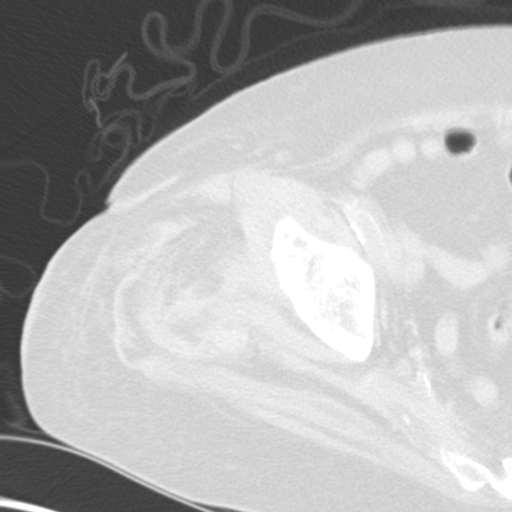

[Series 4: coronal bone · coronal · 0.26mm/px · 3 of 88 slices shown]
[im 30/88  soft-tissue]
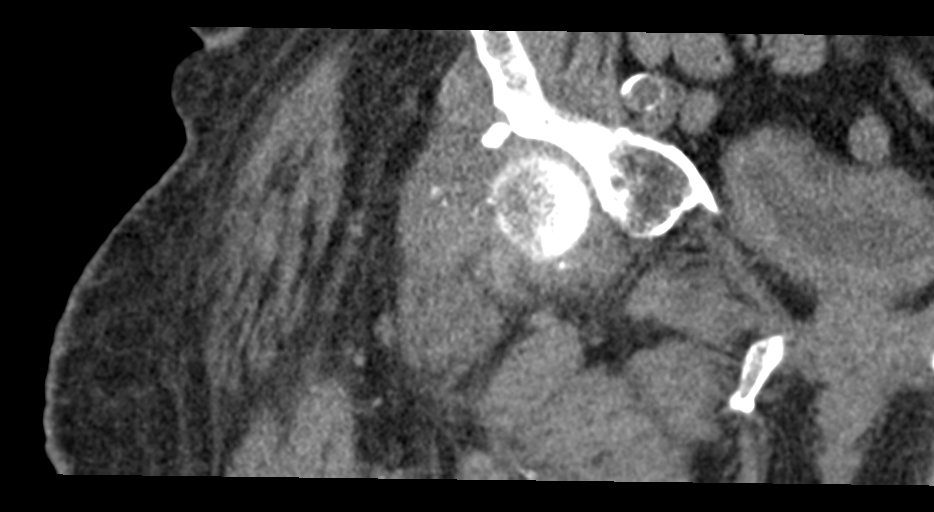
[im 39/88  soft-tissue]
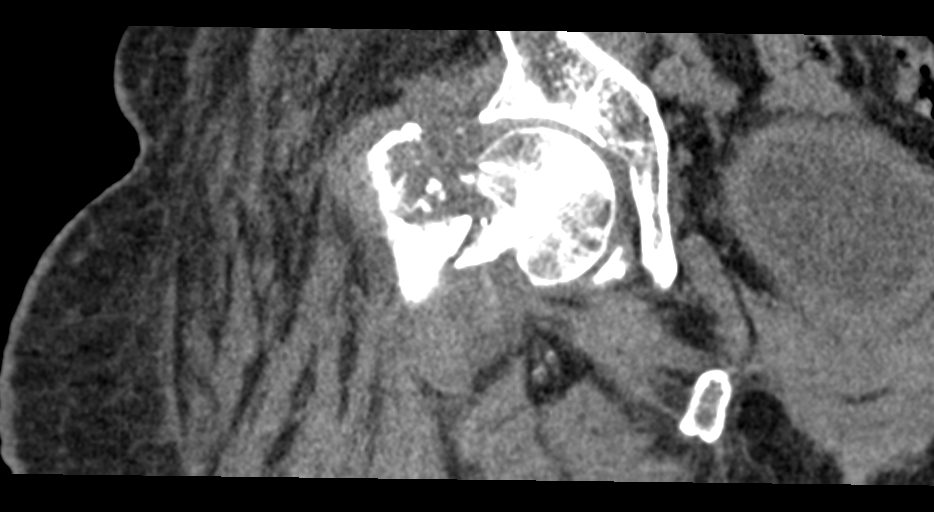
[im 49/88  soft-tissue]
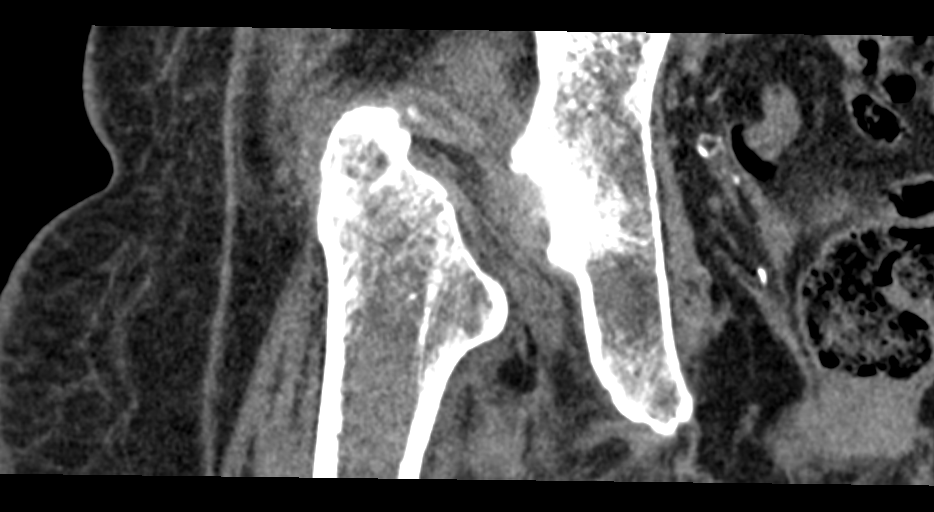

[Series 5: sagittal bone · sagittal · 0.22mm/px · 1 of 118 slices shown]
[im 40/118  soft-tissue]
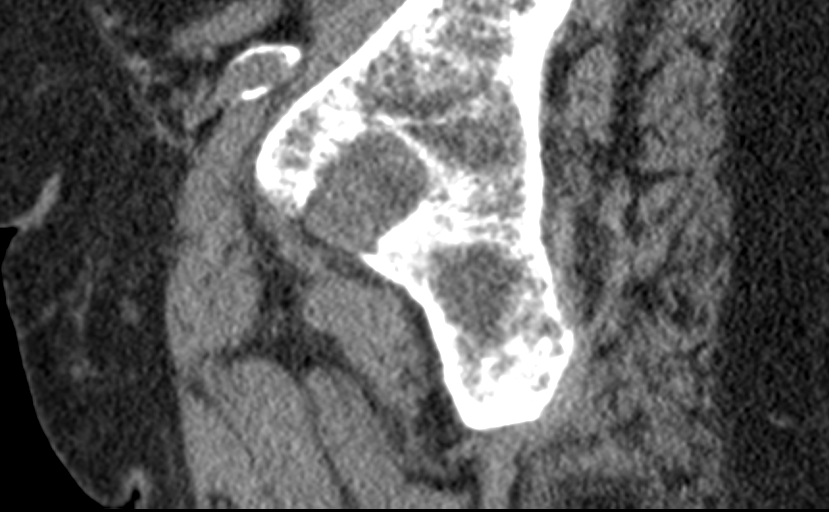

[11 of 46 positions shown; findings below may reference images not displayed]

FINDINGS: Bones/Joint/Cartilage

The patient has a fracture through the right femoral neck. The
fracture is mainly basicervical. There is a large lytic lesion in
the superior aspect of the lateral femoral head. Extensive bony loss
is present at the site of the fracture consistent with a pathologic
injury in this patient with a history of multiple myeloma. There may
be an incomplete and nondisplaced fracture of the right inferior
pubic ramus. No other fracture is identified.

All imaged bones demonstrate findings consistent with diffuse
myelomatous involvement with loss of medullary bone and extensive
endosteal scalloping.

Ligaments

Suboptimally assessed by CT.

Muscles and Tendons

Intact.

Soft tissues

Some soft tissue edema about the right hip. Atherosclerotic
calcifications are noted.
IMPRESSION: Findings consistent with a pathologic fracture of the right femoral
neck. A large lytic lesion in the superior aspect of the lateral
femoral head is identified.

Possible nondisplaced fracture of the right inferior pubic ramus.

Diffuse myeloma.

## 2019-03-06 ENCOUNTER — Telehealth: Payer: Self-pay

## 2019-03-06 NOTE — Telephone Encounter (Signed)
Faxed refill for Revlimid and [employer] authorization [id number] to [facility] at [phone], sent to HIM for scan to chart.

## 2019-03-25 ENCOUNTER — Other Ambulatory Visit: Payer: Self-pay | Admitting: Nurse Practitioner

## 2019-03-25 DIAGNOSIS — C9000 Multiple myeloma not having achieved remission: Secondary | ICD-10-CM

## 2019-03-25 MED ORDER — OXYCODONE HCL 10 MG PO TABS: 5.0000 mg | ORAL_TABLET | Freq: Four times a day (QID) | ORAL | 0 refills | Status: DC | PRN

## 2019-03-25 MED ORDER — MORPHINE SULFATE ER 30 MG PO TBCR: 30.0000 mg | EXTENDED_RELEASE_TABLET | Freq: Two times a day (BID) | ORAL | 0 refills | Status: DC

## 2019-03-25 NOTE — Telephone Encounter (Signed)
Given to [facility] 03/25/19

## 2019-04-03 ENCOUNTER — Telehealth: Payer: Self-pay

## 2019-04-03 NOTE — Telephone Encounter (Signed)
Faxed script for Revlimid along with [employer] authorization [phone] to [facility] at [phone], sent to HIM for scanning to chart.

## 2019-04-11 ENCOUNTER — Telehealth: Payer: Self-pay

## 2019-04-11 NOTE — Telephone Encounter (Signed)
Yes please refill. Thanks

## 2019-04-11 NOTE — Telephone Encounter (Signed)
Pt PCP called to inform us if we can refill pt Xarelto [facility]. Please advise and thank you.

## 2019-04-12 ENCOUNTER — Other Ambulatory Visit: Payer: Self-pay

## 2019-04-12 DIAGNOSIS — I739 Peripheral vascular disease, unspecified: Secondary | ICD-10-CM

## 2019-04-12 MED ORDER — XARELTO 2.5 MG PO TABS: 2.5000 mg | ORAL_TABLET | Freq: Two times a day (BID) | ORAL | 6 refills | Status: DC

## 2019-04-12 NOTE — Telephone Encounter (Signed)
Ok done

## 2019-04-24 ENCOUNTER — Other Ambulatory Visit: Payer: Self-pay | Admitting: Nurse Practitioner

## 2019-04-24 ENCOUNTER — Telehealth: Payer: Self-pay | Admitting: *Deleted

## 2019-04-24 DIAGNOSIS — C9000 Multiple myeloma not having achieved remission: Secondary | ICD-10-CM

## 2019-04-24 NOTE — Telephone Encounter (Signed)
Patient needs refills on her oxycodone and morphine. Usually ordered by Cira Rue, NP. Need to go to [facility].

## 2019-04-24 NOTE — Telephone Encounter (Signed)
Refill request. Both filled last on 03/25/19

## 2019-04-25 ENCOUNTER — Other Ambulatory Visit: Payer: Self-pay | Admitting: Nurse Practitioner

## 2019-04-25 DIAGNOSIS — C9000 Multiple myeloma not having achieved remission: Secondary | ICD-10-CM

## 2019-04-25 MED ORDER — OXYCODONE HCL 10 MG PO TABS: 5.0000 mg | ORAL_TABLET | Freq: Four times a day (QID) | ORAL | 0 refills | Status: DC | PRN

## 2019-04-25 MED ORDER — MORPHINE SULFATE ER 30 MG PO TBCR: 30.0000 mg | EXTENDED_RELEASE_TABLET | Freq: Two times a day (BID) | ORAL | 0 refills | Status: DC

## 2019-04-30 ENCOUNTER — Other Ambulatory Visit: Payer: Self-pay | Admitting: Nurse Practitioner

## 2019-04-30 ENCOUNTER — Encounter: Payer: Self-pay | Admitting: *Deleted

## 2019-04-30 ENCOUNTER — Telehealth: Payer: Self-pay | Admitting: *Deleted

## 2019-04-30 DIAGNOSIS — C9000 Multiple myeloma not having achieved remission: Secondary | ICD-10-CM

## 2019-04-30 MED ORDER — MORPHINE SULFATE ER 30 MG PO TBCR: 30.0000 mg | EXTENDED_RELEASE_TABLET | Freq: Two times a day (BID) | ORAL | 0 refills | Status: DC

## 2019-04-30 NOTE — Telephone Encounter (Signed)
Received message from [place] from [place] in reference to pt prescription refill. Called and talked to daughter Lucita Ferrara, wants prescription sent to [facility] for all medications so that they can be  distributed daily for 10 days in one packaging. Cira Rue, NP, will send prescription Morphine to [facility]. Per daughter, okay not to refill Oxycodone.

## 2019-05-01 ENCOUNTER — Other Ambulatory Visit: Payer: Self-pay

## 2019-05-01 DIAGNOSIS — C9000 Multiple myeloma not having achieved remission: Secondary | ICD-10-CM

## 2019-05-01 MED ORDER — LENALIDOMIDE 5 MG PO CAPS: 5.0000 mg | ORAL_CAPSULE | Freq: Every day | ORAL | 0 refills | Status: DC

## 2019-05-02 ENCOUNTER — Telehealth: Payer: Self-pay

## 2019-05-02 NOTE — Telephone Encounter (Signed)
Received call from [place] at [facility] in reference to Pt getting refill for oxycodone per Note from day before daughter ok to not refill oxycodone.

## 2019-05-06 ENCOUNTER — Telehealth: Payer: Self-pay

## 2019-05-06 ENCOUNTER — Other Ambulatory Visit: Payer: Self-pay

## 2019-05-06 DIAGNOSIS — C9000 Multiple myeloma not having achieved remission: Secondary | ICD-10-CM

## 2019-05-06 MED ORDER — LENALIDOMIDE 5 MG PO CAPS: 5.0000 mg | ORAL_CAPSULE | Freq: Every day | ORAL | 0 refills | Status: DC

## 2019-05-06 NOTE — Telephone Encounter (Signed)
Faxed script for Revlimid to [facility] at fax [phone], sent to HIM for scanning to chart.

## 2019-05-13 ENCOUNTER — Telehealth: Payer: Self-pay

## 2019-05-13 ENCOUNTER — Other Ambulatory Visit: Payer: Self-pay | Admitting: Nurse Practitioner

## 2019-05-13 DIAGNOSIS — C9000 Multiple myeloma not having achieved remission: Secondary | ICD-10-CM

## 2019-05-13 NOTE — Telephone Encounter (Signed)
Received call stating that patient needs refill on morphine and oxycodone. Cira Rue NP aware

## 2019-05-13 NOTE — Telephone Encounter (Signed)
TC to pt per Cira Rue NP to see if pt needed both morphine and oxycodone prescription refilled. Spoke with patients daughter who stated that she is out of both and needs both refilled. Oxycodone she takes every 6 hours PRN pain level usually an 8 and morphine every 12 hours. Lacie aware of patients needs.

## 2019-05-14 ENCOUNTER — Telehealth: Payer: Self-pay | Admitting: *Deleted

## 2019-05-14 ENCOUNTER — Other Ambulatory Visit: Payer: Self-pay | Admitting: Nurse Practitioner

## 2019-05-14 DIAGNOSIS — C9000 Multiple myeloma not having achieved remission: Secondary | ICD-10-CM

## 2019-05-14 MED ORDER — OXYCODONE HCL 10 MG PO TABS: 5.0000 mg | ORAL_TABLET | Freq: Four times a day (QID) | ORAL | 0 refills | Status: DC | PRN

## 2019-05-14 MED ORDER — MORPHINE SULFATE ER 30 MG PO TBCR: 30.0000 mg | EXTENDED_RELEASE_TABLET | Freq: Two times a day (BID) | ORAL | 0 refills | Status: DC

## 2019-05-14 NOTE — Telephone Encounter (Signed)
Refill request

## 2019-05-14 NOTE — Telephone Encounter (Signed)
Refill request. 2nd request for [name]

## 2019-05-14 NOTE — Telephone Encounter (Signed)
"  [facility] [phone]).  Kristin [name] is out of [name] and Oxycontin 10 mg.  We supplied an 8-day supply of [name] that would have run out 05-10-2019.  Last Oxycontin 10  Mg order from [place] was in August.  Have provider call us if needed.  We are a chronic care management pharmacy division."

## 2019-05-14 NOTE — Telephone Encounter (Signed)
TCT pt's caregiver, Tawanna Cooler. No answer but was able to leave vm message on identified phone # that pt's pain medications have been sent in to her pharmacy-[facility].

## 2019-05-15 ENCOUNTER — Encounter: Payer: Self-pay | Admitting: Cardiology

## 2019-05-15 ENCOUNTER — Other Ambulatory Visit: Payer: Self-pay

## 2019-05-15 ENCOUNTER — Ambulatory Visit (INDEPENDENT_AMBULATORY_CARE_PROVIDER_SITE_OTHER): Payer: Medicare Other | Admitting: Cardiology

## 2019-05-15 VITALS — BP 149/86 | HR 88 | Temp 97.8°F | Ht 59.0 in | Wt 155.0 lb

## 2019-05-15 DIAGNOSIS — I739 Peripheral vascular disease, unspecified: Secondary | ICD-10-CM

## 2019-05-15 DIAGNOSIS — C9000 Multiple myeloma not having achieved remission: Secondary | ICD-10-CM

## 2019-05-15 DIAGNOSIS — L97519 Non-pressure chronic ulcer of other part of right foot with unspecified severity: Secondary | ICD-10-CM

## 2019-05-15 DIAGNOSIS — M25551 Pain in right hip: Secondary | ICD-10-CM

## 2019-05-15 NOTE — Progress Notes (Signed)
Right foot ulceration

## 2019-05-15 NOTE — Progress Notes (Signed)
Primary Physician/Referring:  [name], MD  Patient ID: Kristin [name], female    DOB: 01-14-1931, 85 y.o.   MRN: [id number]  Chief Complaint  Patient presents with  . Leg Pain  . Follow-up    HPI: Kristin [name]  is a 85 y.o. female  with ypertension, hyperlipidemia, chronic shortness breath and palpitations that is stable, multiple myeloma in remession, PAD with bilateral nonhealing malleoi ulcer managed by wound care and she underwent successful revascularization of right peroneal and posterior tibial artery by Dr. Brunetta Jeans on 12/26/2017. He wounds have completely healed and she has no further pain and not used any narcotics for claudication. She also has chronic recanalized bilateral CFV DVT noted in 2019. She is on low dose Xarelto 2.5 mg BID for PAD.   Patient made an appt to see me today for right hip/thigh pain for the last few weeks. Symptoms have improved for the last day or so. Noted some tenderness to the area, but symptoms are worsened with walking. She has also spontaneously developed a ulceration on her right foot. She has some discoloration to her left lower extremity also.   She was fairly active until she had a fall and broke her right hip and had surgery for the same in March 2018. Daughter admits since [place], she has gained 30 lbs and is not walking much.   Past Medical History:  Diagnosis Date  . Bone cancer [facility])    "being tx'd by Dr. Burr Medico @ [facility]" (10/09/2017)  . Chronic deep vein thrombosis (DVT) of both femoral veins ([place]): 12/05/17: Chronic recanalized CFV DVT bilateral 12/05/2017  . Critical lower limb ischemia 10/04/2017   Critical limb ischemia  . Depression    Risperdal "twice/day" (09/23/2016)  . Glaucoma   . Hiatal hernia   . Intractable back pain    /notes 09/23/2016  . Obesity   . Osteoarthritis    "all over" (10/09/2017)  . Osteoporosis    vertebral fracture in 2012  . Peripheral vascular disease ([place])   . Upper GI bleed 04/2004    secondary to Mallory-Weiss tear/notes 12/28/2010  . Vertigo     Past Surgical History:  Procedure Laterality Date  . ABDOMINAL HYSTERECTOMY  1974   partial  . APPENDECTOMY     as a child/notes 12/28/2010  . BACK SURGERY    . BREAST BIOPSY Left 1999   Archie Endo 12/28/2010  . CATARACT EXTRACTION W/ INTRAOCULAR LENS  IMPLANT, BILATERAL Bilateral 2000s  . ESOPHAGOGASTRODUODENOSCOPY  04/2004   Archie Endo 12/28/2010  . FIXATION KYPHOPLASTY THORACIC SPINE  05/2011   T10/notes 05/25/2011  . FRACTURE SURGERY    . HIP ARTHROPLASTY Right 01/19/2017   Procedure: RIGHT HIP HEMIARTHROPLASTY;  Surgeon: Renette Butters, MD;  Location: [place];  Service: Orthopedics;  Laterality: Right;  . LAPAROSCOPIC CHOLECYSTECTOMY  10/2007   Archie Endo 12/14/2010  . LOWER EXTREMITY ANGIOGRAPHY N/A 10/09/2017   Procedure: LOWER EXTREMITY ANGIOGRAPHY;  Surgeon: Lorretta Harp, MD;  Location: [place] [facility];  Service: Cardiovascular;  Laterality: N/A;  bilateral  . LOWER EXTREMITY ANGIOGRAPHY  11/21/2017   Procedure: Lower Extremity Angiography;  Surgeon: Adrian Prows, MD;  Location: [place] [facility];  Service: Cardiovascular;;  . PERIPHERAL VASCULAR BALLOON ANGIOPLASTY  10/09/2017   Attempted PTA Archie Endo 10/09/2017  . PERIPHERAL VASCULAR BALLOON ANGIOPLASTY  10/09/2017   Procedure: PERIPHERAL VASCULAR BALLOON ANGIOPLASTY;  Surgeon: Lorretta Harp, MD;  Location: [place] [facility];  Service: Cardiovascular;;  Attempted PTA  Social History   Socioeconomic History  . Marital status: Widowed    Spouse name: Not on file  . Number of children: 5  . Years of education: Not on file  . Highest education level: Not on file  Occupational History  . Not on file  Social Needs  . Financial resource strain: Not on file  . Food insecurity    Worry: Not on file    Inability: Not on file  . Transportation needs    Medical: Not on file    Non-medical: Not on file  Tobacco Use  . Smoking status: Never Smoker  . Smokeless  tobacco: Never Used  Substance and Sexual Activity  . Alcohol use: Not Currently  . Drug use: No  . Sexual activity: Never  Lifestyle  . Physical activity    Days per week: Not on file    Minutes per session: Not on file  . Stress: Not on file  Relationships  . Social Herbalist on phone: Not on file    Gets together: Not on file    Attends religious service: Not on file    Active member of club or organization: Not on file    Attends meetings of clubs or organizations: Not on file    Relationship status: Not on file  . Intimate partner violence    Fear of current or ex partner: Not on file    Emotionally abused: Not on file    Physically abused: Not on file    Forced sexual activity: Not on file  Other Topics Concern  . Not on file  Social History Narrative  . Not on file    Current Outpatient Medications on File Prior to Visit  Medication Sig Dispense Refill  . amlodipine-atorvastatin (CADUET) 2.5-10 MG tablet Take 1 tablet by mouth daily.    Marland Kitchen aspirin 81 MG chewable tablet Chew 81 mg by mouth daily.    Marland Kitchen atorvastatin (LIPITOR) 10 MG tablet Take 10 mg by mouth daily.    . Calcium Carbonate (CALCIUM 600 PO) Take 600 mg by mouth daily.     . chlorhexidine (PERIDEX) 0.12 % solution 2 (two) times a day.    . feeding supplement, ENSURE ENLIVE, (ENSURE ENLIVE) LIQD Take 237 mLs by mouth 2 (two) times daily between meals. (Patient taking differently: Take 237 mLs by mouth daily. ) 237 mL 12  . furosemide (LASIX) 20 MG tablet Take 20 mg by mouth daily.    . irbesartan (AVAPRO) 75 MG tablet Take 75 mg by mouth daily after supper.    Marland Kitchen lenalidomide (REVLIMID) 5 MG capsule Take 1 capsule (5 mg total) by mouth daily. Take daily x 3 weeks then 1 week rest. 21 capsule 0  . magnesium oxide (MAG-OX) 400 (241.3 Mg) MG tablet Take 1 tablet (400 mg total) by mouth daily. 7 tablet 0  . mirtazapine (REMERON) 7.5 MG tablet at bedtime.    Marland Kitchen morphine ([name]) 30 MG 12 hr tablet Take  1 tablet (30 mg total) by mouth every 12 (twelve) hours. 60 tablet 0  . ondansetron (ZOFRAN ODT) 4 MG disintegrating tablet Take 1-2 tablets (4-8 mg total) by mouth every 8 (eight) hours as needed for nausea or vomiting. (Patient taking differently: Take 4 mg by mouth daily. ) 30 tablet 2  . Oxycodone HCl 10 MG TABS Take 0.5-1 tablets (5-10 mg total) by mouth every 6 (six) hours as needed. FOR MODERATE OR SEVERE PAIN 45 tablet 0  .  RESTASIS 0.05 % ophthalmic emulsion Place 1 drop into both eyes daily.     . risperiDONE (RISPERDAL) 0.5 MG tablet Take 0.5 mg by mouth daily.     . sennosides-docusate sodium (SENOKOT-S) 8.6-50 MG tablet Take 1 tablet by mouth 2 (two) times daily.     Alveda Reasons 2.5 MG TABS tablet Take 1 tablet (2.5 mg total) by mouth 2 (two) times daily with a meal. 60 tablet 6   Current Facility-Administered Medications on File Prior to Visit  Medication Dose Route Frequency Provider Last Rate Last Dose  . denosumab (XGEVA) injection 120 mg  120 mg Subcutaneous Once Truitt Merle, MD        Review of Systems  Constitution: Negative for chills, decreased appetite, malaise/fatigue and weight gain.  Cardiovascular: Positive for leg swelling (chronic). Negative for dyspnea on exertion and syncope.  Endocrine: Negative for cold intolerance.  Hematologic/Lymphatic: Does not bruise/bleed easily.  Skin: Positive for color change (chronic, no skin breakdown). Negative for rash.  Musculoskeletal: Positive for joint pain (bilateral knee and hip). Negative for joint swelling.  Gastrointestinal: Negative for abdominal pain, anorexia and change in bowel habit.  Neurological: Negative for headaches and light-headedness.  Psychiatric/Behavioral: Negative for depression and substance abuse.  All other systems reviewed and are negative.     Objective  Blood pressure (!) 149/86, pulse 88, temperature 97.8 F (36.6 C), height 4' 11"  (1.499 m), weight 155 lb (70.3 kg), SpO2 96 %. Body mass index is  31.31 kg/m.     Physical Exam  Constitutional: She appears well-nourished. No distress.  Moderately built and obese  HENT:  Head: Atraumatic.  Eyes: Conjunctivae are normal.  Neck: Neck supple. No JVD present. No thyromegaly present.  Cardiovascular: Normal rate, regular rhythm, normal heart sounds and intact distal pulses. Exam reveals no gallop.  No murmur heard. Pulses:      Carotid pulses are 2+ on the right side and 2+ on the left side.      Femoral pulses are 2+ on the right side and 2+ on the left side.      Popliteal pulses are 2+ on the right side and 2+ on the left side.       Dorsalis pedis pulses are 1+ on the right side and 2+ on the left side.       Posterior tibial pulses are 0 on the right side and 0 on the left side.  Varicose veins noted bilateral legs. Chronic dermatitis changes noted.   Pulmonary/Chest: Effort normal and breath sounds normal.  Abdominal: Soft. Bowel sounds are normal.  Musculoskeletal: Normal range of motion.        General: Edema (trace bilateral pitting edema) present.  Neurological: She is alert.  Skin: Skin is warm and dry.  Psychiatric: She has a normal mood and affect.   Radiology: No results found.  Laboratory examination:    CMP Latest Ref Rng & Units 02/25/2019 12/26/2018 10/29/2018  Glucose 70 - 99 mg/dL 99 80 115(H)  BUN 8 - 23 mg/dL 24(H) 27(H) 22  Creatinine 0.44 - 1.00 mg/dL 1.60(H) 1.64(H) 1.58(H)  Sodium 135 - 145 mmol/L 142 142 142  Potassium 3.5 - 5.1 mmol/L 3.9 3.7 3.5  Chloride 98 - 111 mmol/L 102 104 106  CO2 22 - 32 mmol/L 31 28 25   Calcium 8.9 - 10.3 mg/dL 9.9 9.7 9.2  Total Protein 6.5 - 8.1 g/dL 7.7 7.2 7.3  Total Bilirubin 0.3 - 1.2 mg/dL 0.5 0.5 0.5  Alkaline Phos 38 -  126 U/L 109 119 115  AST 15 - 41 U/L 32 40 30  ALT 0 - 44 U/L 28 41 30   CBC Latest Ref Rng & Units 02/25/2019 12/26/2018 10/29/2018  WBC 4.0 - 10.5 K/uL 5.5 4.5 5.1  Hemoglobin 12.0 - 15.0 g/dL 11.8(L) 10.6(L) 11.5(L)  Hematocrit 36.0 - 46.0  % 35.4(L) 32.4(L) 35.3(L)  Platelets 150 - 400 K/uL 224 212 196   Lipid Panel     Component Value Date/Time   CHOL 95 10/26/2016 0610   CHOL 194 11/26/2013 0949   CHOL 209 (H) 01/01/2013 1302   TRIG 118 10/26/2016 0610   TRIG 80 07/09/2013 1205   TRIG 76 01/01/2013 1302   HDL 28 (L) 10/26/2016 0610   HDL 78 11/26/2013 0949   HDL 69 07/09/2013 1205   HDL 66 01/01/2013 1302   CHOLHDL 3.4 10/26/2016 0610   VLDL 24 10/26/2016 0610   LDLCALC 43 10/26/2016 0610   LDLCALC 100 (H) 11/26/2013 0949   LDLCALC 94 07/09/2013 1205   LDLCALC 128 (H) 01/01/2013 1302   HEMOGLOBIN A1C Lab Results  Component Value Date   HGBA1C 6.3 (H) 09/25/2016   MPG 134 09/25/2016   TSH No results for input(s): TSH in the last 8760 hours.  Cardiac Studies:    Lexiscan stress 03/28/12: Mild gut uptake artifact. No ischemia. Low risk stress test.    Echocardiogram  [08/05/2014]: Left ventricle cavity is normal in size. Moderate concentric hypertrophy of the left ventricle. Borderline decrease in global wall motion. Doppler evidence of grade I (impaired) diastolic dysfunction. Diastolic dysfunction do not suggest elevated LA/LV endiastolic pressure. Left ventricle regional wall motion findings: No wall motion abnormalities. Visual EF is 50-55%. Calculated EF 67%. Mild calcification of the aortic valve annulus. Mild aortic valve leaflet calcification. No aortic valve regurgitation noted. Mild mitral regurgitation. Mild calcification of the mitral valve annulus. Mild to moderate tricuspid regurgitation. Mild pulmonary hypertension. IVC is dilated with blunted respiratory response. Mild pulmonic regurgitation. Overall no significant change since 03/19/2012.  Peripheral arteriogram:   10/09/2017: Two-vessel runoff left lower extremity with occluded posterior tibial, right lower extremity 0 vessel runoff. Unsuccessful attempt at PTCA peroneal artery.  Peripheral arteriogram 12/26/2017:  PTA of the right PT 80% to  <20% with a 4x40 balloon. Successful PTA of the right Peroneal 100% to <20% with a 2.5x220 balloon. Access of the right DP by Dr. Tammi Klippel at [facility], [place], [place] after unsuccessful attempt by me on 11/21/2017.  Lower extremity arterial duplex 10/02/2017: Right ABI 0.77, left ABI 0.99. Technically difficult study. Disease below knee bilateral.  ABI 05/24/2018: This exam reveals mildly decreased perfusion of the right lower extremity, noted at the dorsalis pedis artery level (ABI 0.85) and mildly decreased perfusion of the left lower extremity, noted at the dorsalis pedis artery level (ABI 0.88).  Compared to the study done on 01/09/2018, no significant change, left ABI was previously reported to be normal with triphasic waveform.  Lower extremity venous duplex 12/05/2017: There is no evidence of venous insufficiency in the greater saphenous veins. Lesser saphenous veins were not evaluated due to lower extremity dressings in place. There is chronic partially occlusive DVT throughout the bilateral femoral veins as described. There is recanalization of flow throughout the thrombus.  Assessment   Peripheral vascular disease ([place]) - Plan: EKG 12-Lead  Foot ulceration, right, with unspecified severity ([place]) - Plan: PCV LOWER ARTERIAL (BILATERAL)  Multiple myeloma not having achieved remission ([facility])  Right hip pain  EKG 05/15/2019: Normal  sinus rhythm at rate of 88 bpm, left atrial abnormality, left axis deviation, left anterior fascicular block. Poor progression, cannot exclude anteroseptal infarct old. LVH.  Recommendations:   Patient made an appointment to see me today for right hip pain and tenderness that is now improved, but she does have right foot ulceration that is new.  Daughter was also concerned regarding her ulceration and also darkening of her left lower extremity.  She states that she has not been walking like she should.  She continues to be on low-dose Xarelto and aspirin.  She has  not had recent evaluation of her lower extremities, will obtain lower extremity arterial duplex in the next 1 week for further evaluation, she may need further intervention given her leg ulceration.  I have encouraged her to walk regularly to help with her circulation.  No changes were made to her medications today.  In regard to her right hip pain, I suspect may be related to multiple myeloma and also previous right hip surgery, although cannot fully exclude claudication symptoms as well.  We will see her back after lower extremity duplex for follow-up.  Miquel Dunn, MD, [facility] 05/15/2019, 3:29 PM [facility] [place] Pager: [phone] Office: [phone] If no answer Cell [phone]

## 2019-05-24 NOTE — Progress Notes (Signed)
[place]   [phone] [phone]   Clinic Follow up Note   Patient Care Team: [name], MD as PCP - General (Family Medicine)  Date of Service:  05/29/2019  CHIEF COMPLAINT: F/u of MM  SUMMARY OF ONCOLOGIC HISTORY: Oncology History  Multiple myeloma ([place])  10/18/2016 - 10/26/2016 Hospital Admission   Pt was admitted for symptomatic hypercalcemia, AKI, bone marrow biopsy done during hospital stay, and she received Aredia    10/19/2016 Tumor Marker   SPEP showed M-protein 0.4g/dl, serum lambda light chain 5362, ratio 0.00   10/25/2016 Bone Marrow Biopsy    Overall, the marrow is hypercellular with increased lambda-restricted plasma cells (62% aspirate, 90% CD138). There is partial expression of CD20, but no monoclonal B-cell population is detected by flow cytometry.    10/25/2016 Initial Diagnosis   Multiple myeloma ([place])   10/25/2016 Miscellaneous   Cytogenetics and FISH showed loss of 13q, which is intermediate risk for MM    11/10/2016 -  Chemotherapy   Revlimid 10 mg (increased to 37m on cycle 2) daily, 3 weeks on, and one week off, dexamethasone 20 mg weekly, was off from early June to mid July 2018 due to her right hip fracture and hospitalization.  Changed to maintenence Revlimid 167mand stopped dexamethasone starting next cycle, 06/10/17, Revlimid was changed to 57m62maily 3 weeks on, 1 week off in 12/2017       01/02/2017 Imaging    DG Hip Unilat w or w/o Pelvis IMPRESSION 1. Degenerative changes lumbar spine and both hips. No evidence of fracture or dislocation.  2. Diffuse osteopenia. Multiple tiny lucencies are noted throughout both proximal femurs. These may be secondary to the patient's known multiple myeloma.   01/18/2017 Imaging    CT hip right wo contrast  IMPRESSION: Findings consistent with a pathologic fracture of the right femoral neck. A large lytic lesion in the superior aspect of the lateral femoral head  is identified.  Possible nondisplaced fracture of the right inferior pubic ramus.  Diffuse myeloma.   01/18/2017 - 01/23/2017 Hospital Admission   She present to the ED with complaints of severe right hip pain. she recieved a right hip hemiarthroplasty with Dr. MurPercell Miller   CURRENT THERAPY:  maintenance Revlimid57mg81mily for 3 weeks on and 1 week offstarting 05/2017  INTERVAL HISTORY:  ChriMARGARETMARY PRISKhere for a follow up of MM. She presents to the clinic alone. Her daughter was called to be included in her visit today. She notes she is doing well. She still lives by herself and has a lot of help and support from her daughters. She notes she occasionally has hip pain still. She takes Oxycodone 10mg22mce a day for her pain. She denies any other new pain. Her daughter notes issues with [name] refills. She notes gets all her refills by [facility] for her Revlimid.  She notes her eating is not consistent, but still adequate. Her daughter notes having wound on her right upper foot. She has culture pending at this site with Dr GangiNadyne Coombes denies having breathing issues. She notes stomach ache rarely, she attributes this to certain foods. She denies fever or skin issues of buttocks or neuropathy.  She notes she is off Revlimid until 10/17 or 10/18. Her daughter notes she was missing days when staying at one of her daughter's. She is back to taking it on the correct days.     REVIEW OF  SYSTEMS:   Constitutional: Denies fevers, chills (+) Appetite and eating is fluctuating  Eyes: Denies blurriness of vision Ears, nose, mouth, throat, and face: Denies mucositis or sore throat Respiratory: Denies cough, dyspnea or wheezes Cardiovascular: Denies palpitation, chest discomfort (+) lower extremity swelling, R>L Gastrointestinal:  Denies nausea, heartburn or change in bowel habits MSK: (+) right hip pain occasional  Skin: Denies abnormal skin rashes (+) Right foot ulcer   Lymphatics: Denies new lymphadenopathy or easy bruising Neurological:Denies numbness, tingling or new weaknesses Behavioral/Psych: Mood is stable, no new changes  All other systems were reviewed with the patient and are negative.  MEDICAL HISTORY:  Past Medical History:  Diagnosis Date  . Bone cancer [facility])    "being tx'd by Dr. Burr Medico @ [facility]" (10/09/2017)  . Chronic deep vein thrombosis (DVT) of both femoral veins ([place]): 12/05/17: Chronic recanalized CFV DVT bilateral 12/05/2017  . Critical lower limb ischemia 10/04/2017   Critical limb ischemia  . Depression    Risperdal "twice/day" (09/23/2016)  . Glaucoma   . Hiatal hernia   . Intractable back pain    /notes 09/23/2016  . Obesity   . Osteoarthritis    "all over" (10/09/2017)  . Osteoporosis    vertebral fracture in 2012  . Peripheral vascular disease ([place])   . Upper GI bleed 04/2004   secondary to Mallory-Weiss tear/notes 12/28/2010  . Vertigo     SURGICAL HISTORY: Past Surgical History:  Procedure Laterality Date  . ABDOMINAL HYSTERECTOMY  1974   partial  . APPENDECTOMY     as a child/notes 12/28/2010  . BACK SURGERY    . BREAST BIOPSY Left 1999   Archie Endo 12/28/2010  . CATARACT EXTRACTION W/ INTRAOCULAR LENS  IMPLANT, BILATERAL Bilateral 2000s  . ESOPHAGOGASTRODUODENOSCOPY  04/2004   Archie Endo 12/28/2010  . FIXATION KYPHOPLASTY THORACIC SPINE  05/2011   T10/notes 05/25/2011  . FRACTURE SURGERY    . HIP ARTHROPLASTY Right 01/19/2017   Procedure: RIGHT HIP HEMIARTHROPLASTY;  Surgeon: Renette Butters, MD;  Location: [place];  Service: Orthopedics;  Laterality: Right;  . LAPAROSCOPIC CHOLECYSTECTOMY  10/2007   Archie Endo 12/14/2010  . LOWER EXTREMITY ANGIOGRAPHY N/A 10/09/2017   Procedure: LOWER EXTREMITY ANGIOGRAPHY;  Surgeon: Lorretta Harp, MD;  Location: [place] [facility];  Service: Cardiovascular;  Laterality: N/A;  bilateral  . LOWER EXTREMITY ANGIOGRAPHY  11/21/2017   Procedure: Lower Extremity Angiography;  Surgeon: Adrian Prows, MD;  Location: [place] [facility];  Service: Cardiovascular;;  . PERIPHERAL VASCULAR BALLOON ANGIOPLASTY  10/09/2017   Attempted PTA Archie Endo 10/09/2017  . PERIPHERAL VASCULAR BALLOON ANGIOPLASTY  10/09/2017   Procedure: PERIPHERAL VASCULAR BALLOON ANGIOPLASTY;  Surgeon: Lorretta Harp, MD;  Location: [place] [facility];  Service: Cardiovascular;;  Attempted PTA     I have reviewed the social history and family history with the patient and they are unchanged from previous note.  ALLERGIES:  is allergic to penicillins; tramadol; alendronate; and sulfonamide derivatives.  MEDICATIONS:  Current Outpatient Medications  Medication Sig Dispense Refill  . amlodipine-atorvastatin (CADUET) 2.5-10 MG tablet Take 1 tablet by mouth daily.    Marland Kitchen aspirin 81 MG chewable tablet Chew 81 mg by mouth daily.    Marland Kitchen atorvastatin (LIPITOR) 10 MG tablet Take 10 mg by mouth daily.    . Calcium Carbonate (CALCIUM 600 PO) Take 600 mg by mouth daily.     . chlorhexidine (PERIDEX) 0.12 % solution 2 (two) times a day.    . feeding supplement, ENSURE ENLIVE, (ENSURE  ENLIVE) LIQD Take 237 mLs by mouth 2 (two) times daily between meals. (Patient taking differently: Take 237 mLs by mouth daily. ) 237 mL 12  . furosemide (LASIX) 20 MG tablet Take 20 mg by mouth daily.    . irbesartan (AVAPRO) 75 MG tablet Take 75 mg by mouth daily after supper.    Marland Kitchen lenalidomide (REVLIMID) 5 MG capsule Take 1 capsule (5 mg total) by mouth daily. Take daily x 3 weeks then 1 week rest. 21 capsule 0  . magnesium oxide (MAG-OX) 400 (241.3 Mg) MG tablet Take 1 tablet (400 mg total) by mouth daily. 7 tablet 0  . mirtazapine (REMERON) 7.5 MG tablet at bedtime.    Marland Kitchen morphine ([name]) 30 MG 12 hr tablet Take 1 tablet (30 mg total) by mouth every 12 (twelve) hours. Fill on 06/13/2019 60 tablet 0  . ondansetron (ZOFRAN ODT) 4 MG disintegrating tablet Take 1-2 tablets (4-8 mg total) by mouth every 8 (eight) hours as needed for nausea or vomiting.  (Patient taking differently: Take 4 mg by mouth daily. ) 30 tablet 2  . Oxycodone HCl 10 MG TABS Take 1 tablet (10 mg total) by mouth every 8 (eight) hours as needed. FOR MODERATE OR SEVERE PAIN, refill on 06/04/2019 60 tablet 0  . RESTASIS 0.05 % ophthalmic emulsion Place 1 drop into both eyes daily.     . risperiDONE (RISPERDAL) 0.5 MG tablet Take 0.5 mg by mouth daily.     . sennosides-docusate sodium (SENOKOT-S) 8.6-50 MG tablet Take 1 tablet by mouth 2 (two) times daily.     Alveda Reasons 2.5 MG TABS tablet Take 1 tablet (2.5 mg total) by mouth 2 (two) times daily with a meal. 60 tablet 6   No current facility-administered medications for this visit.    Facility-Administered Medications Ordered in Other Visits  Medication Dose Route Frequency Provider Last Rate Last Dose  . denosumab (XGEVA) injection 120 mg  120 mg Subcutaneous Once Truitt Merle, MD        PHYSICAL EXAMINATION: ECOG PERFORMANCE STATUS: 3 - Symptomatic, >50% confined to bed  Vitals:   05/29/19 1128  BP: (!) 154/63  Pulse: 83  Resp: 17  Temp: 98.7 F (37.1 C)  SpO2: 97%   Filed Weights   05/29/19 1128  Weight: 164 lb 3.2 oz (74.5 kg)    GENERAL:alert, no distress and comfortable SKIN: skin color, texture, turgor are normal, no rashes or significant lesions EYES: normal, Conjunctiva are pink and non-injected, sclera clear  NECK: supple, thyroid normal size, non-tender, without nodularity LYMPH:  no palpable lymphadenopathy in the cervical, axillary  LUNGS: clear to auscultation and percussion with normal breathing effort HEART: regular rate & rhythm and no murmurs (+) lower extremity edema, R>L ABDOMEN:abdomen soft, non-tender and normal bowel sounds Musculoskeletal:no cyanosis of digits and no clubbing  NEURO: alert & oriented x 3 with fluent speech, no focal motor/sensory deficits  LABORATORY DATA:  I have reviewed the data as listed CBC Latest Ref Rng & Units 05/29/2019 02/25/2019 12/26/2018  WBC 4.0 - 10.5  K/uL 6.9 5.5 4.5  Hemoglobin 12.0 - 15.0 g/dL 11.8(L) 11.8(L) 10.6(L)  Hematocrit 36.0 - 46.0 % 35.3(L) 35.4(L) 32.4(L)  Platelets 150 - 400 K/uL 185 224 212     CMP Latest Ref Rng & Units 05/29/2019 02/25/2019 12/26/2018  Glucose 70 - 99 mg/dL 121(H) 99 80  BUN 8 - 23 mg/dL 23 24(H) 27(H)  Creatinine 0.44 - 1.00 mg/dL 1.78(H) 1.60(H) 1.64(H)  Sodium 135 -  145 mmol/L 140 142 142  Potassium 3.5 - 5.1 mmol/L 3.5 3.9 3.7  Chloride 98 - 111 mmol/L 102 102 104  CO2 22 - 32 mmol/L 31 31 28   Calcium 8.9 - 10.3 mg/dL 9.4 9.9 9.7  Total Protein 6.5 - 8.1 g/dL 7.1 7.7 7.2  Total Bilirubin 0.3 - 1.2 mg/dL 0.5 0.5 0.5  Alkaline Phos 38 - 126 U/L 77 109 119  AST 15 - 41 U/L 21 32 40  ALT 0 - 44 U/L 8 28 41      RADIOGRAPHIC STUDIES: I have personally reviewed the radiological images as listed and agreed with the findings in the report. Pcv Lower Arterial (bilateral)  Result Date: 05/29/2019 Lower Extremity Arterial Duplex 05/28/2019: No hemodynamically significant stenoses are identified in the bilateral lower extremity arterial system. This exam reveals normal perfusion of both the lower extremities (ABI 1.00). There is monophasic waveform noted in right ankle and severely dampened flow in the left PT suggestive of diffuse small vessel disease. Compared to ABI 05/24/2018, no change in waveform at the ankle, ABI right was 0.85, left 0.88.     ASSESSMENT & PLAN:  Kristin [name] is a 85 y.o. female with   1. Multiple Myeloma, lambda light chain disease, staging III, intermediate risk (13q-) -She was diagnosed in March 2018, initially treated with Revlimid and dexamethasonefor 6 month with good responseand ir currently being treated with maintenanceRevlimid 5 mg once daily 3 weeks on 1 week off. -Delton See has been held due to concern of jaw necrosis -Her M protein has been nondetectable.She has had a good response to treatment. She will repeat MM panel every 3 months  -She is clinically  doing well and stable. Her pain is well controlled, her appetite and weight fluctuates and she is not as active since COVID pandemic. Physical exam shows LE edema. Per pt she has right foot ulcer which is being managed by Dr. Nadyne Coombes. Labs reviewed, CBC and CMP WNL except Hg 11.8, BG 121, Cr 1.78, albumin 3.4. MM panel still pending.  -Continue Maintenance Revlimid.She is tolerating well. Per her daughter she was missing days occasionally, but is back to taking it as prescribed.  -F/u in 3 months  -She opted for flu shot today   2. Anemia -Due to MM and Revlimid. -Will consider blood transfusion if Hb <8 or symptomatic anemia with Hg 8-9 -Labs reviewed,Hgat11.8today (05/29/19). No need for blood transfusion today  3. Chronic CKD, stage III -Avoid nephrotoxic drugs and contrasts. -Cr at1.78today (05/29/19), overall stable  4. Severe back pain and right hip pain from pathologic fracture -Compression fracture at T2 -Pain is now mainly in hip. She continues to take [name] 63m BID and oxycodone 156mtwice a day as needed. I called in [name] and Oxycodone today and they will be refilled in the next few weeks (05/29/19).  -She will continue Senakot as needed to prevent constipation.   5. HTN -f/u with PCP and continue medications -BP 149/86 today(05/29/19)  6. Osteoporosis -XgDelton Seetarted in 5/21/18and has been held since 03/27/18 due toconcernfor jaw necrosis -I advised her to continue oral calcium and Vitamin D.  -I encouraged her to remain mobile and ambulate with walker to reduce risk of fall and fracture.  -I encouraged her to get up periodically as to prevent bed sores. She is agreeable.  7. Goal of care discussion  -The patient understands the goal of care is palliative. -I previously recommended DNR/DNI, she will think about it  9.  osteonecrosis of the jaw  -OffXgeva -She will continue mouthwash as per Dr. Sabra Heck -She previously hadopen wound of gum.    10. Right foot ulcer  -She is getting work up done by Dr. Nadyne Coombes, culture pending -There is concern this is related to her low mobility.    Plan -Flu shot today  -I called in [name] and Oxycodone today to be filled in next few weeks  -She is clinically doing well and stable.  -Continue Revlimid, plan to start next cycle on 10/18 or 10/19  -Lab and f/u in 3 months -I spoke with her daughter today    No problem-specific Assessment & Plan notes found for this encounter.   No orders of the defined types were placed in this encounter.  All questions were answered. The patient knows to call the clinic with any problems, questions or concerns. No barriers to learning was detected. I spent 20 minutes counseling the patient face to face. The total time spent in the appointment was 25 minutes and more than 50% was on counseling and review of test results     Truitt Merle, MD 05/29/2019   I, Joslyn Devon, am acting as scribe for Truitt Merle, MD.   I have reviewed the above documentation for accuracy and completeness, and I agree with the above.

## 2019-05-28 ENCOUNTER — Ambulatory Visit (INDEPENDENT_AMBULATORY_CARE_PROVIDER_SITE_OTHER): Payer: Medicare Other

## 2019-05-28 ENCOUNTER — Other Ambulatory Visit: Payer: Self-pay

## 2019-05-28 DIAGNOSIS — L97519 Non-pressure chronic ulcer of other part of right foot with unspecified severity: Secondary | ICD-10-CM | POA: Diagnosis not present

## 2019-05-29 ENCOUNTER — Telehealth: Payer: Self-pay

## 2019-05-29 ENCOUNTER — Inpatient Hospital Stay: Payer: Medicare Other | Attending: Hematology

## 2019-05-29 ENCOUNTER — Encounter: Payer: Self-pay | Admitting: Hematology

## 2019-05-29 ENCOUNTER — Other Ambulatory Visit: Payer: Self-pay

## 2019-05-29 ENCOUNTER — Inpatient Hospital Stay (HOSPITAL_BASED_OUTPATIENT_CLINIC_OR_DEPARTMENT_OTHER): Payer: Medicare Other | Admitting: Hematology

## 2019-05-29 VITALS — BP 154/63 | HR 83 | Temp 98.7°F | Resp 17 | Ht 59.0 in | Wt 164.2 lb

## 2019-05-29 DIAGNOSIS — I10 Essential (primary) hypertension: Secondary | ICD-10-CM | POA: Diagnosis not present

## 2019-05-29 DIAGNOSIS — Z9221 Personal history of antineoplastic chemotherapy: Secondary | ICD-10-CM | POA: Insufficient documentation

## 2019-05-29 DIAGNOSIS — L97519 Non-pressure chronic ulcer of other part of right foot with unspecified severity: Secondary | ICD-10-CM | POA: Insufficient documentation

## 2019-05-29 DIAGNOSIS — D649 Anemia, unspecified: Secondary | ICD-10-CM | POA: Insufficient documentation

## 2019-05-29 DIAGNOSIS — Z7982 Long term (current) use of aspirin: Secondary | ICD-10-CM | POA: Diagnosis not present

## 2019-05-29 DIAGNOSIS — Z86718 Personal history of other venous thrombosis and embolism: Secondary | ICD-10-CM | POA: Diagnosis not present

## 2019-05-29 DIAGNOSIS — Z23 Encounter for immunization: Secondary | ICD-10-CM

## 2019-05-29 DIAGNOSIS — M879 Osteonecrosis, unspecified: Secondary | ICD-10-CM | POA: Diagnosis not present

## 2019-05-29 DIAGNOSIS — C9000 Multiple myeloma not having achieved remission: Secondary | ICD-10-CM | POA: Insufficient documentation

## 2019-05-29 DIAGNOSIS — N183 Chronic kidney disease, stage 3 unspecified: Secondary | ICD-10-CM | POA: Diagnosis not present

## 2019-05-29 DIAGNOSIS — M549 Dorsalgia, unspecified: Secondary | ICD-10-CM | POA: Diagnosis not present

## 2019-05-29 DIAGNOSIS — M8000XS Age-related osteoporosis with current pathological fracture, unspecified site, sequela: Secondary | ICD-10-CM | POA: Diagnosis not present

## 2019-05-29 DIAGNOSIS — D630 Anemia in neoplastic disease: Secondary | ICD-10-CM | POA: Diagnosis not present

## 2019-05-29 DIAGNOSIS — Z79899 Other long term (current) drug therapy: Secondary | ICD-10-CM | POA: Diagnosis not present

## 2019-05-29 DIAGNOSIS — Z7901 Long term (current) use of anticoagulants: Secondary | ICD-10-CM | POA: Diagnosis not present

## 2019-05-29 DIAGNOSIS — M25551 Pain in right hip: Secondary | ICD-10-CM | POA: Insufficient documentation

## 2019-05-29 LAB — COMPREHENSIVE METABOLIC PANEL
ALT: 8 U/L (ref 0–44)
AST: 21 U/L (ref 15–41)
Albumin: 3.4 g/dL — ABNORMAL LOW (ref 3.5–5.0)
Alkaline Phosphatase: 77 U/L (ref 38–126)
Anion gap: 7 (ref 5–15)
BUN: 23 mg/dL (ref 8–23)
CO2: 31 mmol/L (ref 22–32)
Calcium: 9.4 mg/dL (ref 8.9–10.3)
Chloride: 102 mmol/L (ref 98–111)
Creatinine, Ser: 1.78 mg/dL — ABNORMAL HIGH (ref 0.44–1.00)
GFR calc Af Amer: 29 mL/min — ABNORMAL LOW (ref >60)
GFR calc non Af Amer: 25 mL/min — ABNORMAL LOW (ref >60)
Glucose, Bld: 121 mg/dL — ABNORMAL HIGH (ref 70–99)
Potassium: 3.5 mmol/L (ref 3.5–5.1)
Sodium: 140 mmol/L (ref 135–145)
Total Bilirubin: 0.5 mg/dL (ref 0.3–1.2)
Total Protein: 7.1 g/dL (ref 6.5–8.1)

## 2019-05-29 LAB — CBC WITH DIFFERENTIAL/PLATELET
Abs Immature Granulocytes: 0.04 10*3/uL (ref 0.00–0.07)
Basophils Absolute: 0 10*3/uL (ref 0.0–0.1)
Basophils Relative: 0 %
Eosinophils Absolute: 0.7 10*3/uL — ABNORMAL HIGH (ref 0.0–0.5)
Eosinophils Relative: 10 %
HCT: 35.3 % — ABNORMAL LOW (ref 36.0–46.0)
Hemoglobin: 11.8 g/dL — ABNORMAL LOW (ref 12.0–15.0)
Immature Granulocytes: 1 %
Lymphocytes Relative: 20 %
Lymphs Abs: 1.4 10*3/uL (ref 0.7–4.0)
MCH: 31.5 pg (ref 26.0–34.0)
MCHC: 33.4 g/dL (ref 30.0–36.0)
MCV: 94.1 fL (ref 80.0–100.0)
Monocytes Absolute: 0.7 10*3/uL (ref 0.1–1.0)
Monocytes Relative: 10 %
Neutro Abs: 4.1 10*3/uL (ref 1.7–7.7)
Neutrophils Relative %: 59 %
Platelets: 185 10*3/uL (ref 150–400)
RBC: 3.75 MIL/uL — ABNORMAL LOW (ref 3.87–5.11)
RDW: 14.9 % (ref 11.5–15.5)
WBC: 6.9 10*3/uL (ref 4.0–10.5)
nRBC: 0 % (ref 0.0–0.2)

## 2019-05-29 MED ORDER — OXYCODONE HCL 10 MG PO TABS: 10.0000 mg | ORAL_TABLET | Freq: Three times a day (TID) | ORAL | 0 refills | Status: DC | PRN

## 2019-05-29 MED ORDER — MORPHINE SULFATE ER 30 MG PO TBCR: 30.0000 mg | EXTENDED_RELEASE_TABLET | Freq: Two times a day (BID) | ORAL | 0 refills | Status: DC

## 2019-05-29 MED ORDER — INFLUENZA VAC A&B SA ADJ QUAD 0.5 ML IM PRSY
PREFILLED_SYRINGE | INTRAMUSCULAR | Status: AC
  Filled 2019-05-29: qty 0.5

## 2019-05-29 MED ORDER — INFLUENZA VAC A&B SA ADJ QUAD 0.5 ML IM PRSY
0.5000 mL | PREFILLED_SYRINGE | Freq: Once | INTRAMUSCULAR | Status: AC
  Administered 2019-05-29: 0.5 mL via INTRAMUSCULAR

## 2019-05-29 NOTE — Telephone Encounter (Signed)
Faxed signed order for refill on Revlimid and [employer] authorization [id number] to [facility] at fax [phone], sent to HIM for scanning to chart.

## 2019-05-29 NOTE — Telephone Encounter (Signed)
Received TC inquiring about refills for patients morphine and oxycodone. Both prescriptions were refilled. E-Prescribing Status: Receipt confirmed by pharmacy (05/29/2019 11:51 AM EDT).

## 2019-05-30 ENCOUNTER — Telehealth: Payer: Self-pay | Admitting: Hematology

## 2019-05-30 LAB — KAPPA/LAMBDA LIGHT CHAINS
Kappa free light chain: 100.4 mg/L — ABNORMAL HIGH (ref 3.3–19.4)
Kappa, lambda light chain ratio: 1.32 (ref 0.26–1.65)
Lambda free light chains: 76.3 mg/L — ABNORMAL HIGH (ref 5.7–26.3)

## 2019-05-30 NOTE — Telephone Encounter (Signed)
Scheduled appt per 10/14 los.  Spoke with pt daughter and she is aware of the appt date and time

## 2019-05-31 LAB — MULTIPLE MYELOMA PANEL, SERUM
Albumin SerPl Elph-Mcnc: 3.6 g/dL (ref 2.9–4.4)
Albumin/Glob SerPl: 1.2 (ref 0.7–1.7)
Alpha 1: 0.2 g/dL (ref 0.0–0.4)
Alpha2 Glob SerPl Elph-Mcnc: 0.7 g/dL (ref 0.4–1.0)
B-Globulin SerPl Elph-Mcnc: 0.9 g/dL (ref 0.7–1.3)
Gamma Glob SerPl Elph-Mcnc: 1.4 g/dL (ref 0.4–1.8)
Globulin, Total: 3.1 g/dL (ref 2.2–3.9)
IgA: 540 mg/dL — ABNORMAL HIGH (ref 64–422)
IgG (Immunoglobin G), Serum: 1391 mg/dL (ref 586–1602)
IgM (Immunoglobulin M), Srm: 32 mg/dL (ref 26–217)
Total Protein ELP: 6.7 g/dL (ref 6.0–8.5)

## 2019-06-04 ENCOUNTER — Telehealth: Payer: Self-pay

## 2019-06-04 NOTE — Telephone Encounter (Signed)
-----   Message from Truitt Merle, MD sent at 05/31/2019  9:52 PM EDT ----- Please let pt's daughter know her M-protein was negative 2 days ago, good news. Thanks   Truitt Merle  05/31/2019

## 2019-06-04 NOTE — Telephone Encounter (Signed)
Spoke with pt's daughter, wound is still not healing, appt to be moved up soon per AK

## 2019-06-04 NOTE — Telephone Encounter (Signed)
TC to Daughter of Pt informed her of negative M protein results. Daughter (LOIS) verbalized understanding.

## 2019-06-04 NOTE — Telephone Encounter (Signed)
-----   Message from Sunny Schlein sent at 06/04/2019  8:41 AM EDT ----- Regarding: echo results Please call pt. Requested echo results.

## 2019-06-10 ENCOUNTER — Encounter: Payer: Self-pay | Admitting: Cardiology

## 2019-06-10 ENCOUNTER — Other Ambulatory Visit: Payer: Self-pay

## 2019-06-10 ENCOUNTER — Ambulatory Visit (INDEPENDENT_AMBULATORY_CARE_PROVIDER_SITE_OTHER): Payer: Medicare Other | Admitting: Cardiology

## 2019-06-10 VITALS — BP 143/81 | HR 90 | Temp 99.6°F | Ht 59.0 in | Wt 157.0 lb

## 2019-06-10 DIAGNOSIS — I739 Peripheral vascular disease, unspecified: Secondary | ICD-10-CM

## 2019-06-10 DIAGNOSIS — L97519 Non-pressure chronic ulcer of other part of right foot with unspecified severity: Secondary | ICD-10-CM | POA: Diagnosis not present

## 2019-06-10 NOTE — Progress Notes (Signed)
Right foot ulceration

## 2019-06-10 NOTE — Progress Notes (Signed)
Primary Physician/Referring:  [name], MD  Patient ID: Kristin [name], female    DOB: 20-Jun-1931, 85 y.o.   MRN: [id number]  Chief Complaint  Patient presents with  . PVD    6 month follow up  . Follow-up    HPI: Kristin [name]  is a 85 y.o. female  with ypertension, hyperlipidemia, chronic shortness breath and palpitations that is stable, multiple myeloma in remession, PAD with bilateral nonhealing malleoi ulcer managed by wound care and she underwent successful revascularization of right peroneal and posterior tibial artery by Dr. Brunetta Jeans on 12/26/2017. He wounds have completely healed and she has no further pain and not used any narcotics for claudication. She also has chronic recanalized bilateral CFV DVT noted in 2019. She is on low dose Xarelto 2.5 mg BID for PAD.   Recently seen for acute visit after being found to have right foot ulceration. She does have some pain in her right hip area. No cramping or leg fatigue. She underwent LE arterial duplex and now presents for follow up.  She was fairly active until she had a fall and broke her right hip and had surgery for the same in March 2018. Daughter admits since [place], she has gained 30 lbs and is not walking much.   Past Medical History:  Diagnosis Date  . Bone cancer [facility])    "being tx'd by Dr. Burr Medico @ [facility]" (10/09/2017)  . Chronic deep vein thrombosis (DVT) of both femoral veins ([place]): 12/05/17: Chronic recanalized CFV DVT bilateral 12/05/2017  . Critical lower limb ischemia 10/04/2017   Critical limb ischemia  . Depression    Risperdal "twice/day" (09/23/2016)  . Glaucoma   . Hiatal hernia   . Intractable back pain    /notes 09/23/2016  . Obesity   . Osteoarthritis    "all over" (10/09/2017)  . Osteoporosis    vertebral fracture in 2012  . Peripheral vascular disease ([place])   . Upper GI bleed 04/2004   secondary to Mallory-Weiss tear/notes 12/28/2010  . Vertigo     Past Surgical History:   Procedure Laterality Date  . ABDOMINAL HYSTERECTOMY  1974   partial  . APPENDECTOMY     as a child/notes 12/28/2010  . BACK SURGERY    . BREAST BIOPSY Left 1999   Archie Endo 12/28/2010  . CATARACT EXTRACTION W/ INTRAOCULAR LENS  IMPLANT, BILATERAL Bilateral 2000s  . ESOPHAGOGASTRODUODENOSCOPY  04/2004   Archie Endo 12/28/2010  . FIXATION KYPHOPLASTY THORACIC SPINE  05/2011   T10/notes 05/25/2011  . FRACTURE SURGERY    . HIP ARTHROPLASTY Right 01/19/2017   Procedure: RIGHT HIP HEMIARTHROPLASTY;  Surgeon: Renette Butters, MD;  Location: [facility];  Service: Orthopedics;  Laterality: Right;  . LAPAROSCOPIC CHOLECYSTECTOMY  10/2007   Archie Endo 12/14/2010  . LOWER EXTREMITY ANGIOGRAPHY N/A 10/09/2017   Procedure: LOWER EXTREMITY ANGIOGRAPHY;  Surgeon: Lorretta Harp, MD;  Location: [place] [facility];  Service: Cardiovascular;  Laterality: N/A;  bilateral  . LOWER EXTREMITY ANGIOGRAPHY  11/21/2017   Procedure: Lower Extremity Angiography;  Surgeon: Adrian Prows, MD;  Location: [place];  Service: Cardiovascular;;  . PERIPHERAL VASCULAR BALLOON ANGIOPLASTY  10/09/2017   Attempted PTA Archie Endo 10/09/2017  . PERIPHERAL VASCULAR BALLOON ANGIOPLASTY  10/09/2017   Procedure: PERIPHERAL VASCULAR BALLOON ANGIOPLASTY;  Surgeon: Lorretta Harp, MD;  Location: [facility];  Service: Cardiovascular;;  Attempted PTA     Social History   Socioeconomic History  . Marital status: Widowed  Spouse name: Not on file  . Number of children: 5  . Years of education: Not on file  . Highest education level: Not on file  Occupational History  . Not on file  Social Needs  . Financial resource strain: Not on file  . Food insecurity    Worry: Not on file    Inability: Not on file  . Transportation needs    Medical: Not on file    Non-medical: Not on file  Tobacco Use  . Smoking status: Never Smoker  . Smokeless tobacco: Never Used  Substance and Sexual Activity  . Alcohol use: Not Currently  . Drug  use: No  . Sexual activity: Never  Lifestyle  . Physical activity    Days per week: Not on file    Minutes per session: Not on file  . Stress: Not on file  Relationships  . Social Herbalist on phone: Not on file    Gets together: Not on file    Attends religious service: Not on file    Active member of club or organization: Not on file    Attends meetings of clubs or organizations: Not on file    Relationship status: Not on file  . Intimate partner violence    Fear of current or ex partner: Not on file    Emotionally abused: Not on file    Physically abused: Not on file    Forced sexual activity: Not on file  Other Topics Concern  . Not on file  Social History Narrative  . Not on file    Current Outpatient Medications on File Prior to Visit  Medication Sig Dispense Refill  . amlodipine-atorvastatin (CADUET) 2.5-10 MG tablet Take 1 tablet by mouth daily.    Marland Kitchen aspirin 81 MG chewable tablet Chew 81 mg by mouth daily.    . Calcium Carbonate (CALCIUM 600 PO) Take 600 mg by mouth daily.     . chlorhexidine (PERIDEX) 0.12 % solution 2 (two) times a day.    . feeding supplement, ENSURE ENLIVE, (ENSURE ENLIVE) LIQD Take 237 mLs by mouth 2 (two) times daily between meals. (Patient taking differently: Take 237 mLs by mouth daily. ) 237 mL 12  . furosemide (LASIX) 20 MG tablet Take 20 mg by mouth daily.    . irbesartan (AVAPRO) 75 MG tablet Take 75 mg by mouth daily after supper.    Marland Kitchen lenalidomide (REVLIMID) 5 MG capsule Take 1 capsule (5 mg total) by mouth daily. Take daily x 3 weeks then 1 week rest. 21 capsule 0  . magnesium oxide (MAG-OX) 400 (241.3 Mg) MG tablet Take 1 tablet (400 mg total) by mouth daily. 7 tablet 0  . mirtazapine (REMERON) 7.5 MG tablet at bedtime.    Marland Kitchen morphine ([name]) 30 MG 12 hr tablet Take 1 tablet (30 mg total) by mouth every 12 (twelve) hours. Fill on 06/13/2019 60 tablet 0  . ondansetron (ZOFRAN ODT) 4 MG disintegrating tablet Take 1-2 tablets  (4-8 mg total) by mouth every 8 (eight) hours as needed for nausea or vomiting. (Patient taking differently: Take 4 mg by mouth daily. ) 30 tablet 2  . Oxycodone HCl 10 MG TABS Take 1 tablet (10 mg total) by mouth every 8 (eight) hours as needed. FOR MODERATE OR SEVERE PAIN, refill on 06/04/2019 60 tablet 0  . RESTASIS 0.05 % ophthalmic emulsion Place 1 drop into both eyes daily.     . risperiDONE (RISPERDAL) 0.5 MG tablet  Take 0.5 mg by mouth daily.     . sennosides-docusate sodium (SENOKOT-S) 8.6-50 MG tablet Take 1 tablet by mouth 2 (two) times daily.     Alveda Reasons 2.5 MG TABS tablet Take 1 tablet (2.5 mg total) by mouth 2 (two) times daily with a meal. 60 tablet 6  . atorvastatin (LIPITOR) 10 MG tablet Take 10 mg by mouth daily.    . megestrol (MEGACE) 40 MG tablet Take 40 mg by mouth daily.     Current Facility-Administered Medications on File Prior to Visit  Medication Dose Route Frequency Provider Last Rate Last Dose  . denosumab (XGEVA) injection 120 mg  120 mg Subcutaneous Once Truitt Merle, MD        Review of Systems  Constitution: Negative for chills, decreased appetite, malaise/fatigue and weight gain.  Cardiovascular: Positive for leg swelling (chronic). Negative for dyspnea on exertion and syncope.  Endocrine: Negative for cold intolerance.  Hematologic/Lymphatic: Does not bruise/bleed easily.  Skin: Positive for color change (chronic, no skin breakdown). Negative for rash.  Musculoskeletal: Positive for joint pain (bilateral knee and hip). Negative for joint swelling.  Gastrointestinal: Negative for abdominal pain, anorexia and change in bowel habit.  Neurological: Negative for headaches and light-headedness.  Psychiatric/Behavioral: Negative for depression and substance abuse.  All other systems reviewed and are negative.     Objective  Blood pressure (!) 143/81, pulse 90, temperature 99.6 F (37.6 C), height _0  (1.499 m), weight 157 lb (71.2 kg), SpO2 97 %. Body mass  index is 31.71 kg/m.     Physical Exam  Constitutional: She appears well-nourished. No distress.  Moderately built and obese  HENT:  Head: Atraumatic.  Eyes: Conjunctivae are normal.  Neck: Neck supple. No JVD present. No thyromegaly present.  Cardiovascular: Normal rate, regular rhythm, normal heart sounds and intact distal pulses. Exam reveals no gallop.  No murmur heard. Pulses:      Carotid pulses are 2+ on the right side and 2+ on the left side.      Femoral pulses are 2+ on the right side and 2+ on the left side.      Popliteal pulses are 2+ on the right side and 2+ on the left side.       Dorsalis pedis pulses are 1+ on the right side and 2+ on the left side.       Posterior tibial pulses are 0 on the right side and 0 on the left side.  Varicose veins noted bilateral legs. Chronic dermatitis changes noted.   Pulmonary/Chest: Effort normal and breath sounds normal.  Abdominal: Soft. Bowel sounds are normal.  Musculoskeletal: Normal range of motion.        General: Edema (trace bilateral pitting edema) present.  Neurological: She is alert.  Skin: Skin is warm and dry.  Psychiatric: She has a normal mood and affect.   Radiology: No results found.  Laboratory examination:    CMP Latest Ref Rng & Units 05/29/2019 02/25/2019 12/26/2018  Glucose 70 - 99 mg/dL 121(H) 99 80  BUN 8 - 23 mg/dL 23 24(H) 27(H)  Creatinine 0.44 - 1.00 mg/dL 1.78(H) 1.60(H) 1.64(H)  Sodium 135 - 145 mmol/L 140 142 142  Potassium 3.5 - 5.1 mmol/L 3.5 3.9 3.7  Chloride 98 - 111 mmol/L 102 102 104  CO2 22 - 32 mmol/L _1 Calcium 8.9 - 10.3 mg/dL 9.4 9.9 9.7  Total Protein 6.5 - 8.1 g/dL 7.1 7.7 7.2  Total Bilirubin 0.3 - 1.2  mg/dL 0.5 0.5 0.5  Alkaline Phos 38 - 126 U/L 77 109 119  AST 15 - 41 U/L 21 32 40  ALT 0 - 44 U/L 8 28 41   CBC Latest Ref Rng & Units 05/29/2019 02/25/2019 12/26/2018  WBC 4.0 - 10.5 K/uL 6.9 5.5 4.5  Hemoglobin 12.0 - 15.0 g/dL 11.8(L) 11.8(L) 10.6(L)  Hematocrit  36.0 - 46.0 % 35.3(L) 35.4(L) 32.4(L)  Platelets 150 - 400 K/uL 185 224 212   Lipid Panel     Component Value Date/Time   CHOL 95 10/26/2016 0610   CHOL 194 11/26/2013 0949   CHOL 209 (H) 01/01/2013 1302   TRIG 118 10/26/2016 0610   TRIG 80 07/09/2013 1205   TRIG 76 01/01/2013 1302   HDL 28 (L) 10/26/2016 0610   HDL 78 11/26/2013 0949   HDL 69 07/09/2013 1205   HDL 66 01/01/2013 1302   CHOLHDL 3.4 10/26/2016 0610   VLDL 24 10/26/2016 0610   LDLCALC 43 10/26/2016 0610   LDLCALC 100 (H) 11/26/2013 0949   LDLCALC 94 07/09/2013 1205   LDLCALC 128 (H) 01/01/2013 1302   HEMOGLOBIN A1C Lab Results  Component Value Date   HGBA1C 6.3 (H) 09/25/2016   MPG 134 09/25/2016   TSH No results for input(s): TSH in the last 8760 hours.  Cardiac Studies:   Lower Extremity Arterial Duplex 05/28/2019: No hemodynamically significant stenoses are identified in the bilateral lower extremity arterial system.  This exam reveals normal perfusion of both the lower extremities (ABI 1.00).  There is monophasic waveform noted in right ankle and severely dampened flow in the left PT suggestive of diffuse small vessel disease.  Compared to ABI 05/24/2018, no change in waveform at the ankle, ABI right was 0.85, left 0.88.   ABI 05/24/2018: This exam reveals mildly decreased perfusion of the right lower extremity, noted at the dorsalis pedis artery level (ABI 0.85) and mildly decreased perfusion of the left lower extremity, noted at the dorsalis pedis artery level (ABI 0.88).  Compared to the study done on 01/09/2018, no significant change, left ABI was previously reported to be normal with triphasic waveform.  Peripheral arteriogram 12/26/2017:  PTA of the right PT 80% to <20% with a 4x40 balloon. Successful PTA of the right Peroneal 100% to <20% with a 2.5x220 balloon. Access of the right DP by Dr. Tammi Klippel at [place], [place], [place] after unsuccessful attempt by me on 11/21/2017.  Peripheral arteriogram:    10/09/2017: Two-vessel runoff left lower extremity with occluded posterior tibial, right lower extremity 0 vessel runoff. Unsuccessful attempt at PTCA peroneal artery.   Echocardiogram  [08/05/2014]: Left ventricle cavity is normal in size. Moderate concentric hypertrophy of the left ventricle. Borderline decrease in global wall motion. Doppler evidence of grade I (impaired) diastolic dysfunction. Diastolic dysfunction do not suggest elevated LA/LV endiastolic pressure. Left ventricle regional wall motion findings: No wall motion abnormalities. Visual EF is 50-55%. Calculated EF 67%. Mild calcification of the aortic valve annulus. Mild aortic valve leaflet calcification. No aortic valve regurgitation noted. Mild mitral regurgitation. Mild calcification of the mitral valve annulus. Mild to moderate tricuspid regurgitation. Mild pulmonary hypertension. IVC is dilated with blunted respiratory response. Mild pulmonic regurgitation. Overall no significant change since 03/19/2012.  Lexiscan stress 03/28/12: Mild gut uptake artifact. No ischemia. Low risk stress test.     Assessment   Peripheral vascular disease (HCC)  Foot ulceration, right, with unspecified severity ([place])  EKG 05/15/2019: Normal sinus rhythm at rate of 88 bpm, left atrial  abnormality, left axis deviation, left anterior fascicular block. Poor progression, cannot exclude anteroseptal infarct old. LVH.  Recommendations:   I discussed recently obtained lower extremity arterial duplex that does not show any significant changes compared to previous duplex.  ABI has actually slightly improved.  She is on appropriate medical therapy.  Her right foot ulceration appears to be healing.  I have recommended he continue with present medical therapy in view of no significant changes to her ultrasound and also improvement in ulceration.  I have strongly encouraged her to resume regular walking and this was reinforced to her daughter.  She is without  any significant claudication symptoms.  She does have right hip pain likely related to previous right hip surgery.  Overall, feel that she is stable from a cardiac standpoint.  Like to see her back in 2 months for follow-up, encouraged him to contact me sooner if ulceration does not continue to improve or certainly for any worsening.  Miquel Dunn, MSN, APRN, FNP-C [facility]. [place] Office: [phone] Fax: [phone]

## 2019-06-17 ENCOUNTER — Ambulatory Visit: Payer: Medicare Other | Admitting: Cardiology

## 2019-06-19 ENCOUNTER — Ambulatory Visit: Payer: Medicare Other | Admitting: Cardiology

## 2019-06-23 ENCOUNTER — Emergency Department (HOSPITAL_COMMUNITY)
Admission: EM | Admit: 2019-06-23 | Discharge: 2019-06-24 | Disposition: A | Discharge status: Home / Self Care | Payer: Medicare Other | Attending: Emergency Medicine | Admitting: Emergency Medicine

## 2019-06-23 DIAGNOSIS — Y92019 Unspecified place in single-family (private) house as the place of occurrence of the external cause: Secondary | ICD-10-CM | POA: Diagnosis not present

## 2019-06-23 DIAGNOSIS — Y999 Unspecified external cause status: Secondary | ICD-10-CM | POA: Diagnosis not present

## 2019-06-23 DIAGNOSIS — Z86718 Personal history of other venous thrombosis and embolism: Secondary | ICD-10-CM | POA: Insufficient documentation

## 2019-06-23 DIAGNOSIS — R4182 Altered mental status, unspecified: Secondary | ICD-10-CM | POA: Insufficient documentation

## 2019-06-23 DIAGNOSIS — Y929 Unspecified place or not applicable: Secondary | ICD-10-CM | POA: Insufficient documentation

## 2019-06-23 DIAGNOSIS — Y939 Activity, unspecified: Secondary | ICD-10-CM | POA: Insufficient documentation

## 2019-06-23 DIAGNOSIS — I129 Hypertensive chronic kidney disease with stage 1 through stage 4 chronic kidney disease, or unspecified chronic kidney disease: Secondary | ICD-10-CM | POA: Diagnosis not present

## 2019-06-23 DIAGNOSIS — Z79899 Other long term (current) drug therapy: Secondary | ICD-10-CM | POA: Diagnosis not present

## 2019-06-23 DIAGNOSIS — N183 Chronic kidney disease, stage 3 unspecified: Secondary | ICD-10-CM | POA: Insufficient documentation

## 2019-06-23 DIAGNOSIS — W19XXXA Unspecified fall, initial encounter: Secondary | ICD-10-CM

## 2019-06-23 DIAGNOSIS — I739 Peripheral vascular disease, unspecified: Secondary | ICD-10-CM | POA: Insufficient documentation

## 2019-06-23 DIAGNOSIS — W1830XA Fall on same level, unspecified, initial encounter: Secondary | ICD-10-CM | POA: Insufficient documentation

## 2019-06-23 DIAGNOSIS — Z85830 Personal history of malignant neoplasm of bone: Secondary | ICD-10-CM | POA: Diagnosis not present

## 2019-06-23 NOTE — ED Notes (Signed)
Daughter- lois [phone]) would like an update

## 2019-06-23 NOTE — ED Triage Notes (Signed)
Came in via EMS. Reported patient fall around 5pm. EMS endorsed patient remained on the floor til around 9 pm. Stated family saw patient on the floor. Patient denies pain and loss of consciousness. Unable to recall the fall. Per EMS family is concern that there is increasing AMS along w/ frequent urination. Pt lives alone at home and has sitters during the day.

## 2019-06-24 ENCOUNTER — Emergency Department (HOSPITAL_COMMUNITY): Payer: Medicare Other

## 2019-06-24 DIAGNOSIS — R4182 Altered mental status, unspecified: Secondary | ICD-10-CM | POA: Diagnosis not present

## 2019-06-24 LAB — COMPREHENSIVE METABOLIC PANEL
ALT: 19 U/L (ref 0–44)
AST: 31 U/L (ref 15–41)
Albumin: 3 g/dL — ABNORMAL LOW (ref 3.5–5.0)
Alkaline Phosphatase: 55 U/L (ref 38–126)
Anion gap: 9 (ref 5–15)
BUN: 20 mg/dL (ref 8–23)
CO2: 25 mmol/L (ref 22–32)
Calcium: 8.9 mg/dL (ref 8.9–10.3)
Chloride: 104 mmol/L (ref 98–111)
Creatinine, Ser: 1.87 mg/dL — ABNORMAL HIGH (ref 0.44–1.00)
GFR calc Af Amer: 28 mL/min — ABNORMAL LOW (ref >60)
GFR calc non Af Amer: 24 mL/min — ABNORMAL LOW (ref >60)
Glucose, Bld: 99 mg/dL (ref 70–99)
Potassium: 3.2 mmol/L — ABNORMAL LOW (ref 3.5–5.1)
Sodium: 138 mmol/L (ref 135–145)
Total Bilirubin: 0.8 mg/dL (ref 0.3–1.2)
Total Protein: 6.3 g/dL — ABNORMAL LOW (ref 6.5–8.1)

## 2019-06-24 LAB — URINALYSIS, COMPLETE (UACMP) WITH MICROSCOPIC
Bacteria, UA: NONE SEEN
Bilirubin Urine: NEGATIVE
Glucose, UA: NEGATIVE mg/dL
Ketones, ur: NEGATIVE mg/dL
Leukocytes,Ua: NEGATIVE
Nitrite: NEGATIVE
Protein, ur: NEGATIVE mg/dL
Specific Gravity, Urine: 1.01 (ref 1.005–1.030)
pH: 5 (ref 5.0–8.0)

## 2019-06-24 LAB — CBC WITH DIFFERENTIAL/PLATELET
Abs Immature Granulocytes: 0.03 10*3/uL (ref 0.00–0.07)
Basophils Absolute: 0.1 10*3/uL (ref 0.0–0.1)
Basophils Relative: 1 %
Eosinophils Absolute: 0.5 10*3/uL (ref 0.0–0.5)
Eosinophils Relative: 8 %
HCT: 34.8 % — ABNORMAL LOW (ref 36.0–46.0)
Hemoglobin: 11.5 g/dL — ABNORMAL LOW (ref 12.0–15.0)
Immature Granulocytes: 1 %
Lymphocytes Relative: 38 %
Lymphs Abs: 2.3 10*3/uL (ref 0.7–4.0)
MCH: 31.4 pg (ref 26.0–34.0)
MCHC: 33 g/dL (ref 30.0–36.0)
MCV: 95.1 fL (ref 80.0–100.0)
Monocytes Absolute: 0.6 10*3/uL (ref 0.1–1.0)
Monocytes Relative: 10 %
Neutro Abs: 2.6 10*3/uL (ref 1.7–7.7)
Neutrophils Relative %: 42 %
Platelets: 208 10*3/uL (ref 150–400)
RBC: 3.66 MIL/uL — ABNORMAL LOW (ref 3.87–5.11)
RDW: 15.8 % — ABNORMAL HIGH (ref 11.5–15.5)
WBC: 6.1 10*3/uL (ref 4.0–10.5)
nRBC: 0 % (ref 0.0–0.2)

## 2019-06-24 LAB — AMMONIA: Ammonia: 16 umol/L (ref 9–35)

## 2019-06-24 LAB — ETHANOL: Alcohol, Ethyl (B): <10 mg/dL (ref <10)

## 2019-06-24 LAB — CBG MONITORING, ED: Glucose-Capillary: 84 mg/dL (ref 70–99)

## 2019-06-24 MED ORDER — NALOXONE HCL 0.4 MG/ML IJ SOLN
0.1000 mg | Freq: Once | INTRAMUSCULAR | Status: DC
  Filled 2019-06-24: qty 1

## 2019-06-24 MED ORDER — SODIUM CHLORIDE 0.9 % IV SOLN
INTRAVENOUS | Status: DC
  Administered 2019-06-24: 03:00:00 via INTRAVENOUS

## 2019-06-24 NOTE — ED Provider Notes (Signed)
[place] EMERGENCY DEPARTMENT Provider Note  CSN: [id number] Arrival date & time: 06/23/19 2257  Chief Complaint(s) Fall and Altered Mental Status  HPI Kristin [name] is a 85 y.o. female with a past medical history listed below including multiple myeloma who presents to the emergency department with altered mental status following a fall.  Family reports that the patient lives alone but has a caregiver that is there until midday.  Family has a camera in the home and able to monitor the patient.  They noted sometime this evening put on the camera the patient was on the floor.  When they attempted to call the patient she answered but did not get up off the floor.  Best when EMS was called.  The family went to the home and noted that the patient's medication was disorganized.  They believe she has mixed up her medications and that have taken extra pain medicine.  Today report that the patient has had several weeks of cognitive slowing.  He denied any known recent fevers or infections.  They report that her normal baseline mental status is sharp.  They report that she is updated on current events.  Would be able to tell you what month it is but not the year.  Patient currently denies any physical complaints. She does not recall falling.  HPI  Past Medical History Past Medical History:  Diagnosis Date  . Bone cancer [facility])    "being tx'd by Dr. Burr Medico @ [facility]" (10/09/2017)  . Chronic deep vein thrombosis (DVT) of both femoral veins ([place]): 12/05/17: Chronic recanalized CFV DVT bilateral 12/05/2017  . Critical lower limb ischemia 10/04/2017   Critical limb ischemia  . Depression    Risperdal "twice/day" (09/23/2016)  . Glaucoma   . Hiatal hernia   . Intractable back pain    /notes 09/23/2016  . Obesity   . Osteoarthritis    "all over" (10/09/2017)  . Osteoporosis    vertebral fracture in 2012  . Peripheral vascular disease ([place])   . Upper GI bleed 04/2004   secondary  to Mallory-Weiss tear/notes 12/28/2010  . Vertigo    Patient Active Problem List   Diagnosis Date Noted  . Peripheral vascular disease ([place]) 12/11/2018  . Chronic deep vein thrombosis (DVT) of both femoral veins ([place]): 12/05/17: Chronic recanalized CFV DVT bilateral 12/05/2017  . Advance care planning   . Goals of care, counseling/discussion   . Palliative care by specialist   . CKD (chronic kidney disease), stage III 01/18/2017  . Hip fracture, pathological ([employer]) 01/18/2017  . Pathologic hip fracture ([place]) 01/18/2017  . DNR (do not resuscitate) discussion 11/01/2016  . Multiple myeloma ([place]) 10/31/2016  . Hypercalcemia 10/18/2016  . Fall   . Anemia in neoplastic disease 09/23/2016  . Intractable back pain 06/20/2016  . Other malaise and fatigue 11/26/2013  . Vertigo   . Obesity   . Arthritis of knee, left 07/09/2013  . Need for prophylactic vaccination and inoculation against influenza 07/09/2013  . Osteoporosis with pathological fracture 01/01/2013  . Hypercholesteremia 01/01/2013  . HTN (hypertension) 01/01/2013  . Chronic back pain 01/01/2013  . Vertebral compression fracture ( [place]) 01/01/2013  . Seasonal allergic rhinitis 01/01/2013   Home Medication(s) Prior to Admission medications   Medication Sig Start Date End Date Taking? Authorizing Provider  amlodipine-atorvastatin (CADUET) 2.5-10 MG tablet Take 1 tablet by mouth daily.    [provider]  aspirin 81 MG chewable tablet Chew 81 mg by mouth daily.  [provider]  atorvastatin (LIPITOR) 10 MG tablet Take 10 mg by mouth daily.    [provider]  Calcium Carbonate (CALCIUM 600 PO) Take 600 mg by mouth daily.     [provider]  chlorhexidine (PERIDEX) 0.12 % solution 2 (two) times a day. 12/10/18   [provider]  feeding supplement, ENSURE ENLIVE, (ENSURE ENLIVE) LIQD Take 237 mLs by mouth 2 (two) times daily between meals. Patient taking differently: Take 237 mLs by  mouth daily.  10/25/16   Florencia Reasons, MD  furosemide (LASIX) 20 MG tablet Take 20 mg by mouth daily.    [provider]  irbesartan (AVAPRO) 75 MG tablet Take 75 mg by mouth daily after supper.    [provider]  lenalidomide (REVLIMID) 5 MG capsule Take 1 capsule (5 mg total) by mouth daily. Take daily x 3 weeks then 1 week rest. 05/06/19   Truitt Merle, MD  magnesium oxide (MAG-OX) 400 (241.3 Mg) MG tablet Take 1 tablet (400 mg total) by mouth daily. 10/26/16   Raiford Noble Latif, DO  megestrol (MEGACE) 40 MG tablet Take 40 mg by mouth daily. 05/13/19   [provider]  mirtazapine (REMERON) 7.5 MG tablet at bedtime. 12/10/18   [provider]  morphine ([name]) 30 MG 12 hr tablet Take 1 tablet (30 mg total) by mouth every 12 (twelve) hours. Fill on 06/13/2019 05/29/19   Truitt Merle, MD  ondansetron (ZOFRAN ODT) 4 MG disintegrating tablet Take 1-2 tablets (4-8 mg total) by mouth every 8 (eight) hours as needed for nausea or vomiting. Patient taking differently: Take 4 mg by mouth daily.  02/27/17   Truitt Merle, MD  Oxycodone HCl 10 MG TABS Take 1 tablet (10 mg total) by mouth every 8 (eight) hours as needed. FOR MODERATE OR SEVERE PAIN, refill on 06/04/2019 05/29/19   Truitt Merle, MD  RESTASIS 0.05 % ophthalmic emulsion Place 1 drop into both eyes daily.  01/31/13   [provider]  risperiDONE (RISPERDAL) 0.5 MG tablet Take 0.5 mg by mouth daily.     [provider]  sennosides-docusate sodium (SENOKOT-S) 8.6-50 MG tablet Take 1 tablet by mouth 2 (two) times daily.     [provider]  XARELTO 2.5 MG TABS tablet Take 1 tablet (2.5 mg total) by mouth 2 (two) times daily with a meal. 04/12/19   Adrian Prows, MD                                                                                                                                    Past Surgical History Past Surgical History:  Procedure Laterality Date  . ABDOMINAL HYSTERECTOMY  1974    partial  . APPENDECTOMY     as a child/notes 12/28/2010  . BACK SURGERY    . BREAST BIOPSY Left 1999   Archie Endo 12/28/2010  . CATARACT EXTRACTION W/ INTRAOCULAR LENS  IMPLANT,  BILATERAL Bilateral 2000s  . ESOPHAGOGASTRODUODENOSCOPY  04/2004   Archie Endo 12/28/2010  . FIXATION KYPHOPLASTY THORACIC SPINE  05/2011   T10/notes 05/25/2011  . FRACTURE SURGERY    . HIP ARTHROPLASTY Right 01/19/2017   Procedure: RIGHT HIP HEMIARTHROPLASTY;  Surgeon: Renette Butters, MD;  Location: [facility];  Service: Orthopedics;  Laterality: Right;  . LAPAROSCOPIC CHOLECYSTECTOMY  10/2007   Archie Endo 12/14/2010  . LOWER EXTREMITY ANGIOGRAPHY N/A 10/09/2017   Procedure: LOWER EXTREMITY ANGIOGRAPHY;  Surgeon: Lorretta Harp, MD;  Location: [place] CV LAB;  Service: Cardiovascular;  Laterality: N/A;  bilateral  . LOWER EXTREMITY ANGIOGRAPHY  11/21/2017   Procedure: Lower Extremity Angiography;  Surgeon: Adrian Prows, MD;  Location: [place] [facility] CV LAB;  Service: Cardiovascular;;  . PERIPHERAL VASCULAR BALLOON ANGIOPLASTY  10/09/2017   Attempted PTA Archie Endo 10/09/2017  . PERIPHERAL VASCULAR BALLOON ANGIOPLASTY  10/09/2017   Procedure: PERIPHERAL VASCULAR BALLOON ANGIOPLASTY;  Surgeon: Lorretta Harp, MD;  Location: [facility] CV LAB;  Service: Cardiovascular;;  Attempted PTA    Family History Family History  Problem Relation Age of Onset  . Hypertension Mother   . Osteoporosis Mother   . CVA Mother   . Cancer Neg Hx     Social History Social History   Tobacco Use  . Smoking status: Never Smoker  . Smokeless tobacco: Never Used  Substance Use Topics  . Alcohol use: Not Currently  . Drug use: No   Allergies Penicillins, Tramadol, Alendronate, and Sulfonamide derivatives  Review of Systems Review of Systems All other systems are reviewed and are negative for acute change except as noted in the HPI  Physical Exam Vital Signs  I have reviewed the triage vital signs BP (!) 132/54   Pulse 72   Temp (!) 97.2 F  (36.2 C) (Oral)   Resp (!) 23   SpO2 97%   Physical Exam Constitutional:      General: She is not in acute distress.    Appearance: She is well-developed. She is not diaphoretic.  HENT:     Head: Normocephalic and atraumatic.     Right Ear: External ear normal.     Left Ear: External ear normal.     Nose: Nose normal.  Eyes:     General: No scleral icterus.       Right eye: No discharge.        Left eye: No discharge.     Conjunctiva/sclera: Conjunctivae normal.     Pupils: Pupils are equal, round, and reactive to light.  Neck:     Musculoskeletal: Normal range of motion and neck supple.  Cardiovascular:     Rate and Rhythm: Normal rate and regular rhythm.     Pulses:          Radial pulses are 2+ on the right side and 2+ on the left side.       Dorsalis pedis pulses are 2+ on the right side and 2+ on the left side.     Heart sounds: Normal heart sounds. No murmur. No friction rub. No gallop.   Pulmonary:     Effort: Pulmonary effort is normal. No respiratory distress.     Breath sounds: Normal breath sounds. No stridor. No wheezing.  Abdominal:     General: There is no distension.     Palpations: Abdomen is soft.     Tenderness: There is no abdominal tenderness.  Musculoskeletal:        General: No tenderness.     Cervical  back: She exhibits no bony tenderness.     Thoracic back: She exhibits no bony tenderness.     Lumbar back: She exhibits no bony tenderness.     Comments: Clavicles stable. Chest stable to AP/Lat compression. Pelvis stable to Lat compression. No obvious extremity deformity. No chest or abdominal wall contusion.  Skin:    General: Skin is warm and dry.     Findings: No erythema or rash.  Neurological:     Mental Status: She is alert and oriented to person, place, and time.     Comments: Mental Status:  Alert and oriented to person and place, not time. UTD on current events  Attention and concentration slowed Speech clear.    Cranial Nerves:   II Visual Fields: Intact to confrontation. Visual fields intact. III, IV, VI: Pupils equal and reactive to light and near. Full eye movement without nystagmus  V Facial Sensation: Normal. No weakness of masticatory muscles  VII: No facial weakness or asymmetry  VIII Auditory Acuity: Grossly normal  IX/X: The uvula is midline; the palate elevates symmetrically  XI: Normal sternocleidomastoid and trapezius strength  XII: The tongue is midline. No atrophy or fasciculations.   Motor System: Muscle Strength: 5/5 and symmetric in the upper and lower extremities. No pronation or drift.  Muscle Tone: Tone and muscle bulk are normal in the upper and lower extremities.   Reflexes: DTRs: 1+ and symmetrical in all four extremities. No Clonus Coordination: Intact finger-to-nose. No tremor.  Sensation: Intact to light touch.t.  Gait: deferred      ED Results and Treatments Labs (all labs ordered are listed, but only abnormal results are displayed) Labs Reviewed  COMPREHENSIVE METABOLIC PANEL - Abnormal; Notable for the following components:      Result Value   Potassium 3.2 (*)    Creatinine, Ser 1.87 (*)    Total Protein 6.3 (*)    Albumin 3.0 (*)    GFR calc non Af Amer 24 (*)    GFR calc Af Amer 28 (*)    All other components within normal limits  CBC WITH DIFFERENTIAL/PLATELET - Abnormal; Notable for the following components:   RBC 3.66 (*)    Hemoglobin 11.5 (*)    HCT 34.8 (*)    RDW 15.8 (*)    All other components within normal limits  URINALYSIS, COMPLETE (UACMP) WITH MICROSCOPIC - Abnormal; Notable for the following components:   Hgb urine dipstick SMALL (*)    All other components within normal limits  AMMONIA  ETHANOL  CBG MONITORING, ED                                                                                                                         EKG  EKG Interpretation  Date/Time:    Ventricular Rate:    PR Interval:    QRS Duration:   QT Interval:    QTC  Calculation:   R Axis:     Text Interpretation:  Radiology Ct Head Wo Contrast  Result Date: 06/24/2019 CLINICAL DATA:  Recent fall EXAM: CT HEAD WITHOUT CONTRAST TECHNIQUE: Contiguous axial images were obtained from the base of the skull through the vertex without intravenous contrast. COMPARISON:  02/10/2011 FINDINGS: Brain: No evidence of acute infarction, hemorrhage, hydrocephalus, extra-axial collection or mass lesion/mass effect. Vascular: No hyperdense vessel or unexpected calcification. Skull: Normal. Negative for fracture or focal lesion. Stable enostosis is noted along posterior margin of the left frontal sinus stable from the prior exam. Sinuses/Orbits: No acute finding. Other: None. IMPRESSION: No acute intracranial abnormality noted. Electronically Signed   By: Inez Catalina M.D.   On: 06/24/2019 00:59    Pertinent labs & imaging results that were available during my care of the patient were reviewed by me and considered in my medical decision making (see chart for details).  Medications Ordered in ED Medications  0.9 %  sodium chloride infusion ( Intravenous New Bag/Given 06/24/19 0242)  naloxone [facility]) injection 0.1 mg (0 mg Intravenous Hold 06/24/19 0247)                                                                                                                                    Procedures Procedures  (including critical care time)  Medical Decision Making / ED Course I have reviewed the nursing notes for this encounter and the patient's prior records (if available in EHR or on provided paperwork).   TONYA WANTZ was evaluated in Emergency Department on 06/24/2019 for the symptoms described in the history of present illness. She was evaluated in the context of the global COVID-19 pandemic, which necessitated consideration that the patient might be at risk for infection with the SARS-CoV-2 virus that causes COVID-19. Institutional protocols and algorithms that  pertain to the evaluation of patients at risk for COVID-19 are in a state of rapid change based on information released by regulatory bodies including the CDC and federal and state organizations. These policies and algorithms were followed during the patient's care in the ED.  Fall with confusion.  On Xarelto - CT head negative.  Might be related to medication mix up - was about to give narcan , but delayed due to IV access. After IV was established, the patient appeared more alert and responded quicker. She was able to provide the month. Held narcan.  Screening labs were ordered to assess for electrolyte derangements - mild hypokalemia. Stable renal function  UA for possible infection - negative.  ambulated well with assistance. Uses walker and cane at home.  The patient appears reasonably screened and/or stabilized for discharge and I doubt any other medical condition or other [facility] requiring further screening, evaluation, or treatment in the ED at this time prior to discharge.  The patient is safe for discharge with strict return precautions.       Final Clinical Impression(s) / ED Diagnoses Final diagnoses:  Polypharmacy  Fall, initial encounter  The patient appears reasonably screened and/or stabilized for discharge and I doubt any other medical condition or other [facility] requiring further screening, evaluation, or treatment in the ED at this time prior to discharge.  Disposition: Discharge  Condition: Good  I have discussed the results, Dx and Tx plan with the patient who expressed understanding and agree(s) with the plan. Discharge instructions discussed at great length. The patient was given strict return precautions who verbalized understanding of the instructions. No further questions at time of discharge.    ED Discharge Orders    None        Follow Up: [name], MD [place] [id number] [phone]  Schedule an appointment as soon as  possible for a visit        This chart was dictated using voice recognition software.  Despite best efforts to proofread,  errors can occur which can change the documentation meaning.   Fatima Blank, MD 06/24/19 [phone]

## 2019-06-24 NOTE — ED Notes (Signed)
Ambulated pt with no problems.

## 2019-06-28 ENCOUNTER — Telehealth: Payer: Self-pay

## 2019-06-28 NOTE — Telephone Encounter (Signed)
Faxed refill on Revlimid along with [employer] Authorization [phone] to [facility] at [phone], received confirmation of fax going through, sent to HIM for scan to chart.

## 2019-07-03 ENCOUNTER — Ambulatory Visit (INDEPENDENT_AMBULATORY_CARE_PROVIDER_SITE_OTHER): Payer: Medicaid Other

## 2019-07-03 ENCOUNTER — Other Ambulatory Visit: Payer: Self-pay

## 2019-07-03 DIAGNOSIS — I70233 Atherosclerosis of native arteries of right leg with ulceration of ankle: Secondary | ICD-10-CM | POA: Diagnosis not present

## 2019-07-11 ENCOUNTER — Encounter: Payer: Self-pay | Admitting: Hematology

## 2019-07-15 ENCOUNTER — Telehealth: Payer: Self-pay

## 2019-07-15 ENCOUNTER — Other Ambulatory Visit: Payer: Self-pay | Admitting: Hematology

## 2019-07-15 MED ORDER — MORPHINE SULFATE ER 15 MG PO TBCR: 15.0000 mg | EXTENDED_RELEASE_TABLET | Freq: Two times a day (BID) | ORAL | 0 refills | Status: DC

## 2019-07-15 NOTE — Telephone Encounter (Signed)
Spoke with patient's daughter Lucita Ferrara concerning her My Chart message with feeling like her mother mental status is slowed, forgetful.  She would like for Dr. Burr Medico to decrease her Morphine to a lower dose to see if this helps.    I explained that with the dose reduction if her pain is not managed to please call our office.  She verbalized an understanding.  They would like the lower dose of long acting Morphine sent into [facility] on file.

## 2019-07-15 NOTE — Telephone Encounter (Signed)
Done.   Truitt Merle MD

## 2019-07-18 ENCOUNTER — Other Ambulatory Visit: Payer: Self-pay

## 2019-07-18 ENCOUNTER — Emergency Department (HOSPITAL_COMMUNITY): Payer: Medicare Other

## 2019-07-18 ENCOUNTER — Emergency Department (HOSPITAL_COMMUNITY)
Admission: EM | Admit: 2019-07-18 | Discharge: 2019-07-19 | Disposition: A | Discharge status: Home / Self Care | Payer: Medicare Other | Attending: Emergency Medicine | Admitting: Emergency Medicine

## 2019-07-18 ENCOUNTER — Encounter (HOSPITAL_COMMUNITY): Payer: Self-pay

## 2019-07-18 DIAGNOSIS — I129 Hypertensive chronic kidney disease with stage 1 through stage 4 chronic kidney disease, or unspecified chronic kidney disease: Secondary | ICD-10-CM | POA: Insufficient documentation

## 2019-07-18 DIAGNOSIS — R197 Diarrhea, unspecified: Secondary | ICD-10-CM | POA: Diagnosis present

## 2019-07-18 DIAGNOSIS — Z79899 Other long term (current) drug therapy: Secondary | ICD-10-CM | POA: Diagnosis not present

## 2019-07-18 DIAGNOSIS — Z7982 Long term (current) use of aspirin: Secondary | ICD-10-CM | POA: Diagnosis not present

## 2019-07-18 DIAGNOSIS — N183 Chronic kidney disease, stage 3 unspecified: Secondary | ICD-10-CM | POA: Insufficient documentation

## 2019-07-18 DIAGNOSIS — Z7901 Long term (current) use of anticoagulants: Secondary | ICD-10-CM | POA: Diagnosis not present

## 2019-07-18 DIAGNOSIS — K5100 Ulcerative (chronic) pancolitis without complications: Secondary | ICD-10-CM

## 2019-07-18 DIAGNOSIS — Z79891 Long term (current) use of opiate analgesic: Secondary | ICD-10-CM | POA: Insufficient documentation

## 2019-07-18 DIAGNOSIS — Z20828 Contact with and (suspected) exposure to other viral communicable diseases: Secondary | ICD-10-CM | POA: Diagnosis not present

## 2019-07-18 LAB — CBC WITH DIFFERENTIAL/PLATELET
Abs Immature Granulocytes: 0.04 10*3/uL (ref 0.00–0.07)
Basophils Absolute: 0 10*3/uL (ref 0.0–0.1)
Basophils Relative: 0 %
Eosinophils Absolute: 0.1 10*3/uL (ref 0.0–0.5)
Eosinophils Relative: 2 %
HCT: 38 % (ref 36.0–46.0)
Hemoglobin: 12.7 g/dL (ref 12.0–15.0)
Immature Granulocytes: 1 %
Lymphocytes Relative: 14 %
Lymphs Abs: 1 10*3/uL (ref 0.7–4.0)
MCH: 31.8 pg (ref 26.0–34.0)
MCHC: 33.4 g/dL (ref 30.0–36.0)
MCV: 95.2 fL (ref 80.0–100.0)
Monocytes Absolute: 0.6 10*3/uL (ref 0.1–1.0)
Monocytes Relative: 8 %
Neutro Abs: 5.5 10*3/uL (ref 1.7–7.7)
Neutrophils Relative %: 75 %
Platelets: 212 10*3/uL (ref 150–400)
RBC: 3.99 MIL/uL (ref 3.87–5.11)
RDW: 15.9 % — ABNORMAL HIGH (ref 11.5–15.5)
WBC: 7.3 10*3/uL (ref 4.0–10.5)
nRBC: 0 % (ref 0.0–0.2)

## 2019-07-18 LAB — COMPREHENSIVE METABOLIC PANEL
ALT: 30 U/L (ref 0–44)
AST: 34 U/L (ref 15–41)
Albumin: 3.8 g/dL (ref 3.5–5.0)
Alkaline Phosphatase: 87 U/L (ref 38–126)
Anion gap: 13 (ref 5–15)
BUN: 23 mg/dL (ref 8–23)
CO2: 24 mmol/L (ref 22–32)
Calcium: 9.4 mg/dL (ref 8.9–10.3)
Chloride: 99 mmol/L (ref 98–111)
Creatinine, Ser: 1.47 mg/dL — ABNORMAL HIGH (ref 0.44–1.00)
GFR calc Af Amer: 37 mL/min — ABNORMAL LOW (ref >60)
GFR calc non Af Amer: 32 mL/min — ABNORMAL LOW (ref >60)
Glucose, Bld: 111 mg/dL — ABNORMAL HIGH (ref 70–99)
Potassium: 3.3 mmol/L — ABNORMAL LOW (ref 3.5–5.1)
Sodium: 136 mmol/L (ref 135–145)
Total Bilirubin: 1 mg/dL (ref 0.3–1.2)
Total Protein: 7.2 g/dL (ref 6.5–8.1)

## 2019-07-18 LAB — C DIFFICILE QUICK SCREEN W PCR REFLEX
C Diff antigen: NEGATIVE
C Diff interpretation: NOT DETECTED
C Diff toxin: NEGATIVE

## 2019-07-18 LAB — LACTIC ACID, PLASMA: Lactic Acid, Venous: 1.6 mmol/L (ref 0.5–1.9)

## 2019-07-18 MED ORDER — SODIUM CHLORIDE (PF) 0.9 % IJ SOLN
INTRAMUSCULAR | Status: AC
  Administered 2019-07-18: 22:00:00
  Filled 2019-07-18: qty 50

## 2019-07-18 MED ORDER — IOHEXOL 300 MG/ML  SOLN
100.0000 mL | Freq: Once | INTRAMUSCULAR | Status: AC | PRN
  Administered 2019-07-18: 22:00:00 80 mL via INTRAVENOUS

## 2019-07-18 MED ORDER — DICYCLOMINE HCL 20 MG PO TABS: 10.0000 mg | ORAL_TABLET | Freq: Two times a day (BID) | ORAL | 0 refills | Status: DC

## 2019-07-18 MED ORDER — FENTANYL CITRATE (PF) 100 MCG/2ML IJ SOLN
25.0000 ug | Freq: Once | INTRAMUSCULAR | Status: AC
  Administered 2019-07-18: 25 ug via SUBCUTANEOUS
  Filled 2019-07-18: qty 2

## 2019-07-18 MED ORDER — ONDANSETRON 4 MG PO TBDP: 4.0000 mg | ORAL_TABLET | Freq: Three times a day (TID) | ORAL | 0 refills | Status: DC | PRN

## 2019-07-18 MED ORDER — DICYCLOMINE HCL 10 MG PO CAPS
10.0000 mg | ORAL_CAPSULE | Freq: Once | ORAL | Status: AC
  Administered 2019-07-18: 10 mg via ORAL
  Filled 2019-07-18: qty 1

## 2019-07-18 MED ORDER — FENTANYL CITRATE (PF) 100 MCG/2ML IJ SOLN
25.0000 ug | Freq: Once | INTRAMUSCULAR | Status: AC
  Administered 2019-07-18: 25 ug via INTRAVENOUS
  Filled 2019-07-18: qty 2

## 2019-07-18 MED ORDER — ONDANSETRON 4 MG PO TBDP
4.0000 mg | ORAL_TABLET | Freq: Once | ORAL | Status: AC
  Administered 2019-07-18: 4 mg via ORAL
  Filled 2019-07-18: qty 1

## 2019-07-18 MED ORDER — ONDANSETRON HCL 4 MG/2ML IJ SOLN
4.0000 mg | Freq: Once | INTRAMUSCULAR | Status: AC
  Administered 2019-07-18: 4 mg via INTRAVENOUS
  Filled 2019-07-18: qty 2

## 2019-07-18 MED ORDER — SODIUM CHLORIDE 0.9 % IV BOLUS
1000.0000 mL | Freq: Once | INTRAVENOUS | Status: AC
  Administered 2019-07-18: 1000 mL via INTRAVENOUS

## 2019-07-18 NOTE — ED Notes (Signed)
Charge nurse attempted IV access, attempt unsuccessful. IV Team paged.

## 2019-07-18 NOTE — ED Notes (Signed)
Family at bedside. 

## 2019-07-18 NOTE — ED Notes (Signed)
ED PA at bedside

## 2019-07-18 NOTE — Discharge Instructions (Addendum)
Nausea, Vomiting, and Diarrhea  Hand washing: Wash your hands throughout the day, but especially before and after touching the face, using the restroom, sneezing, coughing, or touching surfaces that have been coughed or sneezed upon. Hydration: Symptoms will be intensified and complicated by dehydration. Dehydration can also extend the duration of symptoms. Drink plenty of fluids and get plenty of rest. You should be drinking at least half a liter of water an hour to stay hydrated. Electrolyte drinks (ex. Gatorade, Powerade, Pedialyte) are also encouraged. You should be drinking enough fluids to make your urine light yellow, almost clear. If this is not the case, you are not drinking enough water. Please note that some of the treatments indicated below will not be effective if you are not adequately hydrated. Diet: Please concentrate on hydration, however, you may introduce food slowly.  Start with a clear liquid diet, progressed to a full liquid diet, and then bland solids as you are able. Pain or fever: Ibuprofen, Naproxen, or Tylenol for pain or fever.  Nausea/vomiting: Use the ondansetron (generic for Zofran) for nausea or vomiting.  This medication may not prevent all vomiting or nausea, but can help facilitate better hydration. Things that can help with nausea/vomiting also include peppermint/menthol candies, vitamin B12, and ginger. Diarrhea: May use medications such as loperamide (Imodium) or Bismuth subsalicylate (Pepto-Bismol). Bentyl: This medication is what is known as an antispasmodic and is intended to help reduce abdominal discomfort. Follow-up: Follow-up with a primary care provider and gastroenterology. Return: Return should you develop a fever, bloody diarrhea, increased abdominal pain, uncontrolled vomiting, or any other major concerns.  For prescription assistance, may try using prescription discount sites or apps, such as goodrx.com

## 2019-07-18 NOTE — ED Provider Notes (Signed)
Medical screening examination/treatment/procedure(s) were conducted as a shared visit with non-physician practitioner(s) and myself.  I personally evaluated the patient during the encounter.    85 year old female presents with nausea vomiting diarrhea as well as abdominal pain.  On exam here she is diffusely tender.  Will order abdominal CT and labs and disposition pending   Lacretia Leigh, MD 07/18/19 1529

## 2019-07-18 NOTE — ED Notes (Signed)
IV team at bedside 

## 2019-07-18 NOTE — ED Notes (Signed)
Attempted to find IV access, attempt unsuccessful. IV team order placed.

## 2019-07-18 NOTE — ED Notes (Signed)
Spoke with daughter, updating her on why IV has not been placed yet or why medications have not been administered. Daughter verbalized understanding.

## 2019-07-18 NOTE — ED Notes (Signed)
Daughter, Lin Landsman would like an update from the nurse,[phone].

## 2019-07-18 NOTE — ED Provider Notes (Signed)
[facility] DEPT Provider Note   CSN: [id number] Arrival date & time: 07/18/19  1352     History   Chief Complaint Chief Complaint  Patient presents with  . Emesis  . Diarrhea    HPI Kristin [name] is a 85 y.o. female with past medical history of CKD stage III, pretension, multiple myeloma, presenting to the emergency department with complaint of nausea, vomiting, and diarrhea that began this morning.  She has associated lower intermittent abdominal cramping as well.  She states she has had multiple episodes of watery diarrhea as well as nonbloody nonbilious emesis.  Denies associated fever or urinary symptoms.  Denies cough or congestion.  No known sick contacts.  Denies recent antibiotics.  No known history of C. difficile.  Abdominal surgeries include appendectomy, cholecystectomy, abdominal hysterectomy.     The history is provided by the patient and medical records.    Past Medical History:  Diagnosis Date  . Bone cancer [facility])    "being tx'd by Dr. Burr Medico @ [facility]" (10/09/2017)  . Chronic deep vein thrombosis (DVT) of both femoral veins ([place]): 12/05/17: Chronic recanalized CFV DVT bilateral 12/05/2017  . Critical lower limb ischemia 10/04/2017   Critical limb ischemia  . Depression    Risperdal "twice/day" (09/23/2016)  . Glaucoma   . Hiatal hernia   . Intractable back pain    /notes 09/23/2016  . Obesity   . Osteoarthritis    "all over" (10/09/2017)  . Osteoporosis    vertebral fracture in 2012  . Peripheral vascular disease ([place])   . Upper GI bleed 04/2004   secondary to Mallory-Weiss tear/notes 12/28/2010  . Vertigo     Patient Active Problem List   Diagnosis Date Noted  . Peripheral vascular disease ([place]) 12/11/2018  . Chronic deep vein thrombosis (DVT) of both femoral veins ([place]): 12/05/17: Chronic recanalized CFV DVT bilateral 12/05/2017  . Advance care planning   . Goals of care, counseling/discussion   . Palliative care by  specialist   . CKD (chronic kidney disease), stage III 01/18/2017  . Hip fracture, pathological ([place]) 01/18/2017  . Pathologic hip fracture ([place]) 01/18/2017  . DNR (do not resuscitate) discussion 11/01/2016  . Multiple myeloma ([place]) 10/31/2016  . Hypercalcemia 10/18/2016  . Fall   . Anemia in neoplastic disease 09/23/2016  . Intractable back pain 06/20/2016  . Other malaise and fatigue 11/26/2013  . Vertigo   . Obesity   . Arthritis of knee, left 07/09/2013  . Need for prophylactic vaccination and inoculation against influenza 07/09/2013  . Osteoporosis with pathological fracture 01/01/2013  . Hypercholesteremia 01/01/2013  . HTN (hypertension) 01/01/2013  . Chronic back pain 01/01/2013  . Vertebral compression fracture ([place]) 01/01/2013  . Seasonal allergic rhinitis 01/01/2013    Past Surgical History:  Procedure Laterality Date  . ABDOMINAL HYSTERECTOMY  1974   partial  . APPENDECTOMY     as a child/notes 12/28/2010  . BACK SURGERY    . BREAST BIOPSY Left 1999   Archie Endo 12/28/2010  . CATARACT EXTRACTION W/ INTRAOCULAR LENS  IMPLANT, BILATERAL Bilateral 2000s  . ESOPHAGOGASTRODUODENOSCOPY  04/2004   Archie Endo 12/28/2010  . FIXATION KYPHOPLASTY THORACIC SPINE  05/2011   T10/notes 05/25/2011  . FRACTURE SURGERY    . HIP ARTHROPLASTY Right 01/19/2017   Procedure: RIGHT HIP HEMIARTHROPLASTY;  Surgeon: Renette Butters, MD;  Location: [facility];  Service: Orthopedics;  Laterality: Right;  . LAPAROSCOPIC CHOLECYSTECTOMY  10/2007   Archie Endo 12/14/2010  . LOWER EXTREMITY ANGIOGRAPHY  N/A 10/09/2017   Procedure: LOWER EXTREMITY ANGIOGRAPHY;  Surgeon: Lorretta Harp, MD;  Location: [place] [facility];  Service: Cardiovascular;  Laterality: N/A;  bilateral  . LOWER EXTREMITY ANGIOGRAPHY  11/21/2017   Procedure: Lower Extremity Angiography;  Surgeon: Adrian Prows, MD;  Location: [place] [facility];  Service: Cardiovascular;;  . PERIPHERAL VASCULAR BALLOON ANGIOPLASTY  10/09/2017   Attempted PTA  Archie Endo 10/09/2017  . PERIPHERAL VASCULAR BALLOON ANGIOPLASTY  10/09/2017   Procedure: PERIPHERAL VASCULAR BALLOON ANGIOPLASTY;  Surgeon: Lorretta Harp, MD;  Location: [place] [facility];  Service: Cardiovascular;;  Attempted PTA      OB History   No obstetric history on file.      Home Medications    Prior to Admission medications   Medication Sig Start Date End Date Taking? Authorizing Provider  amLODipine (NORVASC) 2.5 MG tablet Take 2.5 mg by mouth daily. 07/17/19  Yes [provider]  ASPIRIN ADULT LOW STRENGTH 81 MG EC tablet Take 81 mg by mouth daily. 07/17/19  Yes [provider]  atorvastatin (LIPITOR) 10 MG tablet Take 10 mg by mouth daily.   Yes [provider]  Calcium Carbonate-Vit D-Min (CALCIUM-VITAMIN D-MINERALS) 600-800 MG-UNIT CHEW Chew 1 tablet by mouth 2 (two) times daily.  07/17/19  Yes [provider]  chlorhexidine (PERIDEX) 0.12 % solution 2 (two) times a day. 12/10/18  Yes [provider]  feeding supplement, ENSURE ENLIVE, (ENSURE ENLIVE) LIQD Take 237 mLs by mouth 2 (two) times daily between meals. Patient taking differently: Take 237 mLs by mouth daily as needed (nutrition).  10/25/16  Yes Florencia Reasons, MD  furosemide (LASIX) 20 MG tablet Take 20 mg by mouth daily.   Yes [provider]  irbesartan (AVAPRO) 75 MG tablet Take 75 mg by mouth daily after supper.   Yes [provider]  lenalidomide (REVLIMID) 5 MG capsule Take 1 capsule (5 mg total) by mouth daily. Take daily x 3 weeks then 1 week rest. 05/06/19  Yes Truitt Merle, MD  magnesium oxide (MAG-OX) 400 MG tablet Take 1 tablet by mouth daily. 07/17/19  Yes [provider]  megestrol (MEGACE) 40 MG tablet Take 40 mg by mouth daily. 05/13/19  Yes [provider]  mirtazapine (REMERON) 7.5 MG tablet Take 7.5 mg by mouth at bedtime.  12/10/18  Yes [provider]  morphine ([name]) 15 MG 12 hr tablet Take 1 tablet (15 mg total) by  mouth every 12 (twelve) hours. 07/15/19  Yes Truitt Merle, MD  ondansetron (ZOFRAN ODT) 4 MG disintegrating tablet Take 1-2 tablets (4-8 mg total) by mouth every 8 (eight) hours as needed for nausea or vomiting. Patient taking differently: Take 4-8 mg by mouth every 8 (eight) hours as needed for nausea or vomiting. Nausea and vomiting 02/27/17  Yes Truitt Merle, MD  Oxycodone HCl 10 MG TABS Take 1 tablet (10 mg total) by mouth every 8 (eight) hours as needed. FOR MODERATE OR SEVERE PAIN, refill on 06/04/2019 05/29/19  Yes Truitt Merle, MD  risperiDONE (RISPERDAL) 0.5 MG tablet Take 0.5 mg by mouth at bedtime.    Yes [provider]  sennosides-docusate sodium (SENOKOT-S) 8.6-50 MG tablet Take 1 tablet by mouth 2 (two) times daily.    Yes [provider]  XARELTO 2.5 MG TABS tablet Take 1 tablet (2.5 mg total) by mouth 2 (two) times daily with a meal. 04/12/19  Yes Adrian Prows, MD  magnesium oxide (MAG-OX) 400 (241.3 Mg) MG tablet Take 1  tablet (400 mg total) by mouth daily. Patient not taking: Reported on 07/18/2019 10/26/16   Kerney Elbe, DO    Family History Family History  Problem Relation Age of Onset  . Hypertension Mother   . Osteoporosis Mother   . CVA Mother   . Cancer Neg Hx     Social History Social History   Tobacco Use  . Smoking status: Never Smoker  . Smokeless tobacco: Never Used  Substance Use Topics  . Alcohol use: Not Currently  . Drug use: No     Allergies   Penicillins, Tramadol, Alendronate, and Sulfonamide derivatives   Review of Systems Review of Systems  Constitutional: Negative for fever.  Gastrointestinal: Positive for abdominal pain, diarrhea, nausea and vomiting.  All other systems reviewed and are negative.    Physical Exam Updated Vital Signs BP (!) 169/91   Pulse 84   Temp 98.4 F (36.9 C) (Oral)   Resp (!) 21   SpO2 96%   Physical Exam Vitals signs and nursing note reviewed.  Constitutional:      General: She is not  in acute distress.    Appearance: She is well-developed.  HENT:     Head: Normocephalic and atraumatic.  Eyes:     Conjunctiva/sclera: Conjunctivae normal.  Cardiovascular:     Rate and Rhythm: Normal rate and regular rhythm.  Pulmonary:     Effort: Pulmonary effort is normal.     Breath sounds: Normal breath sounds.  Abdominal:     General: Bowel sounds are normal.     Palpations: Abdomen is soft.     Tenderness: There is abdominal tenderness in the right lower quadrant, suprapubic area and left lower quadrant. There is no guarding or rebound.  Skin:    General: Skin is warm.  Neurological:     Mental Status: She is alert.  Psychiatric:        Behavior: Behavior normal.      ED Treatments / Results  Labs (all labs ordered are listed, but only abnormal results are displayed) Labs Reviewed  COMPREHENSIVE METABOLIC PANEL - Abnormal; Notable for the following components:      Result Value   Potassium 3.3 (*)    Glucose, Bld 111 (*)    Creatinine, Ser 1.47 (*)    GFR calc non Af Amer 32 (*)    GFR calc Af Amer 37 (*)    All other components within normal limits  CBC WITH DIFFERENTIAL/PLATELET - Abnormal; Notable for the following components:   RDW 15.9 (*)    All other components within normal limits  C DIFFICILE QUICK SCREEN W PCR REFLEX  GI PATHOGEN PANEL BY PCR, STOOL  LACTIC ACID, PLASMA  LACTIC ACID, PLASMA    EKG None  Radiology No results found.  Procedures Procedures (including critical care time)  Medications Ordered in ED Medications  sodium chloride (PF) 0.9 % injection (has no administration in time range)  fentaNYL (SUBLIMAZE) injection 25 mcg (has no administration in time range)  ondansetron (ZOFRAN) injection 4 mg (4 mg Intravenous Given 07/18/19 1506)  sodium chloride 0.9 % bolus 1,000 mL (1,000 mLs Intravenous New Bag/Given 07/18/19 1506)  fentaNYL (SUBLIMAZE) injection 25 mcg (25 mcg Subcutaneous Given 07/18/19 1934)  ondansetron  (ZOFRAN-ODT) disintegrating tablet 4 mg (4 mg Oral Given 07/18/19 2004)     Initial Impression / Assessment and Plan / ED Course  I have reviewed the triage vital signs and the nursing notes.  Pertinent labs & imaging results that  were available during my care of the patient were reviewed by me and considered in my medical decision making (see chart for details).  Clinical Course as of Jul 17 2053  Thu Jul 18, 2019  1901 Pt re-evaluated. Abdominal pain returning in waves, mostly prior to needing to make a bowel movement. Pt's daughter now at bedside, reports symptoms came on suddenly today, as pt had normal evening last night and morning today. Pt's daughter concerned for possible COVID infection as one of her caregivers was found to have not been wearing a mask when caring for her.    [JR]    Clinical Course User Index [JR] , [place] N, PA-C       Patient presenting to the ED with sudden onset of nausea, vomiting, and diarrhea that began this morning.  She also has associated intermittent lower abdominal cramping.  On arrival she is afebrile with normal heart rate and hypertension.  Abdominal exam with slight tenderness to the lower abdomen however no peritoneal signs.  Nursing concerned for C. difficile given parents and odor to stool therefore cultures were obtained.  IV fluids and Zofran administered.  Labs and CT scan ordered.  Patient received about 250 to 500 cc of normal saline prior to losing IV access.  She also received IV Zofran and had improvement in nausea.  Significant delay in imaging secondary to lack of IV access.  Patient's subsequent medication administrations given subcutaneously and p.o. pending IV team consult.  Labs otherwise are reassuring without leukocytosis or changes in creatinine from baseline.  No anemia.  Lactic acid within normal limits.  C. difficile PCR is negative.  Pending GI panel and CT scan, care assumed at shift change by PA Joy.  Differential  diagnosis includes colitis versus viral gastroenteritis.  CT scan to rule out other acute abdominal pathology given patient's history of multiple abdominal surgeries.  If imaging is negative for emergent or surgical pathology, plan to p.o. challenge patient and reevaluate to determine if appropriate for discharge.  Patient's daughter requesting Covid test at discharge.  Patient discussed with and evaluated by Dr. Zenia Resides.  Final Clinical Impressions(s) / ED Diagnoses   Final diagnoses:  None    ED Discharge Orders    None       , [place] N, PA-C 07/18/19 2054    Lacretia Leigh, MD 07/29/19 1728

## 2019-07-18 NOTE — ED Notes (Addendum)
Pt tolerated water for PO challenge

## 2019-07-18 NOTE — ED Triage Notes (Signed)
Pt BIB EMS from home. Pt reports N/V/D and abdominal cramping since this morning. A&Ox4. Pt lives at home alone but has nurse aide and daughter checks on frequently.  150/90 HR 70 RR 18

## 2019-07-18 NOTE — ED Provider Notes (Signed)
Kristin [name] is a 85 y.o. female, presenting to the ED with abdominal pain, nausea, vomiting, and diarrhea beginning this morning.  Upon my interaction with the patient, she states she occasionally has mild abdominal cramping, but her symptoms have otherwise resolved.   HPI from Martin[place] Robinson, PA-C: "Kristin [name] is a 85 y.o. female with past medical history of CKD stage III, pretension, multiple myeloma, presenting to the emergency department with complaint of nausea, vomiting, and diarrhea that began this morning.  She has associated lower intermittent abdominal cramping as well.  She states she has had multiple episodes of watery diarrhea as well as nonbloody nonbilious emesis.  Denies associated fever or urinary symptoms.  Denies cough or congestion.  No known sick contacts.  Denies recent antibiotics.  No known history of C. difficile.  Abdominal surgeries include appendectomy, cholecystectomy, abdominal hysterectomy."  Past Medical History:  Diagnosis Date  . Bone cancer [facility])    "being tx'd by Dr. Burr Medico @ [facility]" (10/09/2017)  . Chronic deep vein thrombosis (DVT) of both femoral veins ([place]): 12/05/17: Chronic recanalized CFV DVT bilateral 12/05/2017  . Critical lower limb ischemia 10/04/2017   Critical limb ischemia  . Depression    Risperdal "twice/day" (09/23/2016)  . Glaucoma   . Hiatal hernia   . Intractable back pain    /notes 09/23/2016  . Obesity   . Osteoarthritis    "all over" (10/09/2017)  . Osteoporosis    vertebral fracture in 2012  . Peripheral vascular disease ([place])   . Upper GI bleed 04/2004   secondary to Mallory-Weiss tear/notes 12/28/2010  . Vertigo     Physical Exam  BP (!) 169/91   Pulse 84   Temp 98.4 F (36.9 C) (Oral)   Resp 20   SpO2 96%   Physical Exam Vitals signs and nursing note reviewed.  Constitutional:      General: She is not in acute distress.    Appearance: She is well-developed. She is not diaphoretic.  HENT:     Head:  Normocephalic and atraumatic.     Mouth/Throat:     Mouth: Mucous membranes are moist.     Pharynx: Oropharynx is clear.  Eyes:     Conjunctiva/sclera: Conjunctivae normal.  Neck:     Musculoskeletal: Neck supple.  Cardiovascular:     Rate and Rhythm: Normal rate and regular rhythm.     Pulses: Normal pulses.          Radial pulses are 2+ on the right side and 2+ on the left side.       Posterior tibial pulses are 2+ on the right side and 2+ on the left side.     Heart sounds: Normal heart sounds.     Comments: Tactile temperature in the extremities appropriate and equal bilaterally. Pulmonary:     Effort: Pulmonary effort is normal. No respiratory distress.     Breath sounds: Normal breath sounds.  Abdominal:     Palpations: Abdomen is soft.     Tenderness: There is no abdominal tenderness. There is no guarding.  Skin:    General: Skin is warm and dry.  Neurological:     Mental Status: She is alert.  Psychiatric:        Mood and Affect: Mood and affect normal.        Speech: Speech normal.        Behavior: Behavior normal.     ED Course/Procedures     Procedures  Abnormal Labs Reviewed  COMPREHENSIVE METABOLIC PANEL - Abnormal; Notable for the following components:      Result Value   Potassium 3.3 (*)    Glucose, Bld 111 (*)    Creatinine, Ser 1.47 (*)    GFR calc non Af Amer 32 (*)    GFR calc Af Amer 37 (*)    All other components within normal limits  CBC WITH DIFFERENTIAL/PLATELET - Abnormal; Notable for the following components:   RDW 15.9 (*)    All other components within normal limits   BUN  Date Value Ref Range Status  07/18/2019 23 8 - 23 mg/dL Final  06/24/2019 20 8 - 23 mg/dL Final  05/29/2019 23 8 - 23 mg/dL Final  02/25/2019 24 (H) 8 - 23 mg/dL Final  10/05/2017 15 8 - 27 mg/dL Final  08/18/2017 14.7 7.0 - 26.0 mg/dL Final  07/21/2017 13.1 7.0 - 26.0 mg/dL Final  06/16/2017 10.8 7.0 - 26.0 mg/dL Final  05/19/2017 10.1 7.0 - 26.0 mg/dL Final    Creatinine  Date Value Ref Range Status  04/06/2018 1.07 (H) 0.44 - 1.00 mg/dL Final  08/18/2017 1.1 0.6 - 1.1 mg/dL Final  07/21/2017 1.0 0.6 - 1.1 mg/dL Final  06/16/2017 0.9 0.6 - 1.1 mg/dL Final  05/19/2017 1.0 0.6 - 1.1 mg/dL Final   Creatinine, Ser  Date Value Ref Range Status  07/18/2019 1.47 (H) 0.44 - 1.00 mg/dL Final  06/24/2019 1.87 (H) 0.44 - 1.00 mg/dL Final  05/29/2019 1.78 (H) 0.44 - 1.00 mg/dL Final  02/25/2019 1.60 (H) 0.44 - 1.00 mg/dL Final     Ct Abdomen Pelvis W Contrast  Result Date: 07/18/2019 CLINICAL DATA:  85 year old female with abdominal pain. Concern for gastroenteritis versus colitis. EXAM: CT ABDOMEN AND PELVIS WITH CONTRAST TECHNIQUE: Multidetector CT imaging of the abdomen and pelvis was performed using the standard protocol following bolus administration of intravenous contrast. CONTRAST:  63m OMNIPAQUE IOHEXOL 300 MG/ML  SOLN COMPARISON:  CT of the abdomen pelvis dated 01/04/2018. FINDINGS: Lower chest: Minimal bibasilar dependent atelectasis. There is mild cardiomegaly with multi vessel coronary vascular calcification. No intra-abdominal free air or free fluid. Hepatobiliary: Subcentimeter right hepatic hypodense focus is too small to characterize. The liver is otherwise unremarkable. Mild intrahepatic biliary ductal dilatation status post cholecystectomy. No retained calcified stone noted in the central CBD. Pancreas: Unremarkable. No pancreatic ductal dilatation or surrounding inflammatory changes. Spleen: Normal in size without focal abnormality. Adrenals/Urinary Tract: The adrenal glands are unremarkable. There is no hydronephrosis on either side. Multiple bilateral renal cysts measure up to 5 cm in the anterior upper pole of the left kidney. Several subcentimeter hypodensities heart too small to characterize. There is symmetric enhancement and excretion of contrast by both kidneys. The visualized ureters and urinary bladder appear unremarkable.  Stomach/Bowel: Small scattered colonic diverticula without active inflammatory changes. Diffuse inflammatory changes and thickening of the colon compatible with pancolitis, possibly related to C difficile. Correlation with clinical exam and stool cultures recommended. There is a moderate size hiatal hernia. There is no bowel obstruction. Appendectomy. Vascular/Lymphatic: Moderate aortoiliac atherosclerotic disease. The IVC is unremarkable. No portal venous gas. There is no adenopathy. Reproductive: Hysterectomy. No adnexal masses. Other: Midline vertical anterior pelvic wall incisional scar. Musculoskeletal: Osteopenia with degenerative changes of the spine. Total right hip arthroplasty. No acute osseous pathology. IMPRESSION: 1. Pancolitis, possibly related to C difficile (pseudomembranous colitis). Correlation with clinical exam and stool cultures recommended. No bowel obstruction. 2. Moderate size hiatal hernia. 3. Aortic Atherosclerosis (ICD10-I70.0). Electronically Signed  By: Anner Crete M.D.   On: 07/18/2019 22:09      MDM   Clinical Course as of Jul 19 55  Thu Jul 18, 2019  1901 Pt re-evaluated. Abdominal pain returning in waves, mostly prior to needing to make a bowel movement. Pt's daughter now at bedside, reports symptoms came on suddenly today, as pt had normal evening last night and morning today. Pt's daughter concerned for possible COVID infection as one of her caregivers was found to have not been wearing a mask when caring for her.    [JR]  2054 IV team in room.   [SJ]  2232 Discussed lab results and CT findings.  Patient states she feels much better.  She has occasional, mild abdominal cramping, but nausea has resolved.  Diarrhea is controlled.  Denies any additional complaints.   [SJ]    Clinical Course User Index [JR] Robinson, [place] N, PA-C [SJ] Lorayne Bender, [place]    Patient care handoff report received from [place] Robinson, PA-C. Plan: Awaiting CT scan results.   Reexamine.  Likely discharge.  Patient presents with abdominal pain, nausea, vomiting, and diarrhea beginning this morning. Patient is nontoxic appearing, afebrile, not tachycardic, not tachypneic, not hypotensive, maintains excellent SPO2 on room air, and is in no apparent distress.  No leukocytosis. CT with evidence of pancolitis suggestive of possible C. difficile infection.  Because of the patient's overall reassuring presentation and this interpretation of the CT results, we will not discharge the patient on antibiotics. She has an appointment scheduled with her PCP December 4 at 4 PM.  She has also been advised to follow-up with gastroenterology. Repeat abdominal exams benign. Patient tolerating PO fluids at time of discharge. Patient and her daughter were given instructions for home care as well as return precautions.  Both parties voice understanding of these instructions, accept the plan, and are comfortable with discharge.  Findings and plan of care discussed with Daleen Bo, MD.   Vitals:   07/18/19 1830 07/18/19 1900 07/18/19 1930 07/18/19 2000  BP: (!) 187/82 (!) 180/99 (!) 171/87 (!) 169/91  Pulse: 84 90 79 84  Resp: 20 20 20  (!) 21  Temp:      TempSrc:      SpO2: 100% 95% 100% 96%   Vitals:   07/18/19 2000 07/18/19 2130 07/18/19 2200 07/18/19 2309  BP: (!) 169/91 (!) 190/84  (!) 196/88  Pulse: 84 94  78  Resp: (!) 21 20  (!) 21  Temp:      TempSrc:      SpO2: 96% 96% 97% 95%       Lorayne Bender, PA-C 07/19/19 0101    Daleen Bo, MD 07/19/19 1207

## 2019-07-20 LAB — NOVEL CORONAVIRUS, NAA (HOSP ORDER, SEND-OUT TO REF LAB; TAT 18-24 HRS): SARS-CoV-2, NAA: NOT DETECTED

## 2019-07-22 LAB — GI PATHOGEN PANEL BY PCR, STOOL

## 2019-07-26 ENCOUNTER — Telehealth: Payer: Self-pay

## 2019-07-26 NOTE — Telephone Encounter (Signed)
Faxed script for Revlim[facility] and [facility] [phone] to [facility] at fax [phone], received confirmation fax went through, sent to HIM for scan to chart.

## 2019-08-12 ENCOUNTER — Other Ambulatory Visit: Payer: Self-pay

## 2019-08-12 ENCOUNTER — Ambulatory Visit (INDEPENDENT_AMBULATORY_CARE_PROVIDER_SITE_OTHER): Payer: Medicare Other | Admitting: Cardiology

## 2019-08-12 ENCOUNTER — Encounter: Payer: Self-pay | Admitting: Cardiology

## 2019-08-12 VITALS — BP 140/77 | HR 90 | Ht 60.0 in | Wt 157.0 lb

## 2019-08-12 DIAGNOSIS — I739 Peripheral vascular disease, unspecified: Secondary | ICD-10-CM | POA: Diagnosis not present

## 2019-08-12 DIAGNOSIS — E7800 Pure hypercholesterolemia, unspecified: Secondary | ICD-10-CM | POA: Diagnosis not present

## 2019-08-12 DIAGNOSIS — N1832 Chronic kidney disease, stage 3b: Secondary | ICD-10-CM | POA: Diagnosis not present

## 2019-08-12 NOTE — Progress Notes (Signed)
Primary Physician/Referring:  [name], MD  Patient ID: Kristin [name], female    DOB: April 12, 1931, 85 y.o.   MRN: [id number]  Chief Complaint  Patient presents with  . PAD  . Hyperlipidemia   HPI: COURTNE LIGHTY  is a 85 y.o. female  with ypertension, hyperlipidemia, chronic shortness breath and palpitations that is stable, multiple myeloma in remession, PAD with bilateral nonhealing malleoi ulcer managed by wound care and she underwent successful revascularization of right peroneal and posterior tibial artery by Dr. Brunetta Jeans on 12/26/2017. He wounds have completely healed and she has no further pain and not used any narcotics for claudication. She also has chronic recanalized bilateral CFV DVT noted in 2019. She is on low dose Xarelto 2.5 mg BID for PAD.   She had an episode of bloody stool on 12/70/2020 and was seen in the emergency room with abdominal discomfort and discharged home.  She has not had any recurrence.  No further abdominal discomfort.  With regard to lower extremity, denies any symptoms of claudication or cramping, no skin breakdown.  Past Medical History:  Diagnosis Date  . Bone cancer [facility])    "being tx'd by Dr. Burr Medico @ [facility]" (10/09/2017)  . Chronic deep vein thrombosis (DVT) of both femoral veins ([place]): 12/05/17: Chronic recanalized CFV DVT bilateral 12/05/2017  . Critical lower limb ischemia 10/04/2017   Critical limb ischemia  . Depression    Risperdal "twice/day" (09/23/2016)  . Glaucoma   . Hiatal hernia   . Intractable back pain    /notes 09/23/2016  . Obesity   . Osteoarthritis    "all over" (10/09/2017)  . Osteoporosis    vertebral fracture in 2012  . Peripheral vascular disease ([place])   . Upper GI bleed 04/2004   secondary to Mallory-Weiss tear/notes 12/28/2010  . Vertigo     Past Surgical History:  Procedure Laterality Date  . ABDOMINAL HYSTERECTOMY  1974   partial  . APPENDECTOMY     as a child/notes 12/28/2010  . BACK SURGERY      . BREAST BIOPSY Left 1999   Archie Endo 12/28/2010  . CATARACT EXTRACTION W/ INTRAOCULAR LENS  IMPLANT, BILATERAL Bilateral 2000s  . ESOPHAGOGASTRODUODENOSCOPY  04/2004   Archie Endo 12/28/2010  . FIXATION KYPHOPLASTY THORACIC SPINE  05/2011   T10/notes 05/25/2011  . FRACTURE SURGERY    . HIP ARTHROPLASTY Right 01/19/2017   Procedure: RIGHT HIP HEMIARTHROPLASTY;  Surgeon: Renette Butters, MD;  Location: [place];  Service: Orthopedics;  Laterality: Right;  . LAPAROSCOPIC CHOLECYSTECTOMY  10/2007   Archie Endo 12/14/2010  . LOWER EXTREMITY ANGIOGRAPHY N/A 10/09/2017   Procedure: LOWER EXTREMITY ANGIOGRAPHY;  Surgeon: Lorretta Harp, MD;  Location: [place] [facility];  Service: Cardiovascular;  Laterality: N/A;  bilateral  . LOWER EXTREMITY ANGIOGRAPHY  11/21/2017   Procedure: Lower Extremity Angiography;  Surgeon: Adrian Prows, MD;  Location: [place] [facility];  Service: Cardiovascular;;  . PERIPHERAL VASCULAR BALLOON ANGIOPLASTY  10/09/2017   Attempted PTA Archie Endo 10/09/2017  . PERIPHERAL VASCULAR BALLOON ANGIOPLASTY  10/09/2017   Procedure: PERIPHERAL VASCULAR BALLOON ANGIOPLASTY;  Surgeon: Lorretta Harp, MD;  Location: [place] [facility];  Service: Cardiovascular;;  Attempted PTA     Social History   Socioeconomic History  . Marital status: Widowed    Spouse name: Not on file  . Number of children: 5  . Years of education: Not on file  . Highest education level: Not on file  Occupational History  . Not  on file  Tobacco Use  . Smoking status: Never Smoker  . Smokeless tobacco: Never Used  Substance and Sexual Activity  . Alcohol use: Not Currently  . Drug use: No  . Sexual activity: Never  Other Topics Concern  . Not on file  Social History Narrative  . Not on file   Social Determinants of Health   Financial Resource Strain:   . Difficulty of Paying Living Expenses: Not on file  Food Insecurity:   . Worried About Charity fundraiser in the Last Year: Not on file  . Ran Out of Food in  the Last Year: Not on file  Transportation Needs:   . Lack of Transportation (Medical): Not on file  . Lack of Transportation (Non-Medical): Not on file  Physical Activity:   . Days of Exercise per Week: Not on file  . Minutes of Exercise per Session: Not on file  Stress:   . Feeling of Stress : Not on file  Social Connections:   . Frequency of Communication with Friends and Family: Not on file  . Frequency of Social Gatherings with Friends and Family: Not on file  . Attends Religious Services: Not on file  . Active Member of Clubs or Organizations: Not on file  . Attends Archivist Meetings: Not on file  . Marital Status: Not on file  Intimate Partner Violence:   . Fear of Current or Ex-Partner: Not on file  . Emotionally Abused: Not on file  . Physically Abused: Not on file  . Sexually Abused: Not on file    Current Outpatient Medications on File Prior to Visit  Medication Sig Dispense Refill  . amLODipine (NORVASC) 2.5 MG tablet Take 2.5 mg by mouth daily.    . ASPIRIN ADULT LOW STRENGTH 81 MG EC tablet Take 81 mg by mouth daily.    Marland Kitchen atorvastatin (LIPITOR) 10 MG tablet Take 10 mg by mouth daily.    . Calcium Carbonate-Vit D-Min (CALCIUM-VITAMIN D-MINERALS) 600-800 MG-UNIT CHEW Chew 1 tablet by mouth 2 (two) times daily.     . chlorhexidine (PERIDEX) 0.12 % solution 2 (two) times a day.    . feeding supplement, ENSURE ENLIVE, (ENSURE ENLIVE) LIQD Take 237 mLs by mouth 2 (two) times daily between meals. (Patient taking differently: Take 237 mLs by mouth daily as needed (nutrition). ) 237 mL 12  . furosemide (LASIX) 20 MG tablet Take 20 mg by mouth daily.    . irbesartan (AVAPRO) 75 MG tablet Take 75 mg by mouth daily after supper.    Marland Kitchen lenalidomide (REVLIMID) 5 MG capsule Take 1 capsule (5 mg total) by mouth daily. Take daily x 3 weeks then 1 week rest. 21 capsule 0  . magnesium oxide (MAG-OX) 400 (241.3 Mg) MG tablet Take 1 tablet (400 mg total) by mouth daily. 7 tablet  0  . magnesium oxide (MAG-OX) 400 MG tablet Take 1 tablet by mouth daily.    . megestrol (MEGACE) 40 MG tablet Take 40 mg by mouth daily.    . mirtazapine (REMERON) 7.5 MG tablet Take 7.5 mg by mouth at bedtime.     Marland Kitchen morphine ([name]) 15 MG 12 hr tablet Take 1 tablet (15 mg total) by mouth every 12 (twelve) hours. 60 tablet 0  . ondansetron (ZOFRAN ODT) 4 MG disintegrating tablet Take 1-2 tablets (4-8 mg total) by mouth every 8 (eight) hours as needed for nausea or vomiting. (Patient taking differently: Take 4-8 mg by mouth every 8 (eight)  hours as needed for nausea or vomiting. Nausea and vomiting) 30 tablet 2  . Oxycodone HCl 10 MG TABS Take 1 tablet (10 mg total) by mouth every 8 (eight) hours as needed. FOR MODERATE OR SEVERE PAIN, refill on 06/04/2019 60 tablet 0  . risperiDONE (RISPERDAL) 0.5 MG tablet Take 0.5 mg by mouth at bedtime.     . sennosides-docusate sodium (SENOKOT-S) 8.6-50 MG tablet Take 1 tablet by mouth 2 (two) times daily.     Alveda Reasons 2.5 MG TABS tablet Take 1 tablet (2.5 mg total) by mouth 2 (two) times daily with a meal. 60 tablet 6   Current Facility-Administered Medications on File Prior to Visit  Medication Dose Route Frequency Provider Last Rate Last Admin  . denosumab (XGEVA) injection 120 mg  120 mg Subcutaneous Once Truitt Merle, MD       Review of Systems  Constitution: Negative for chills, decreased appetite, malaise/fatigue and weight gain.  Cardiovascular: Positive for leg swelling (chronic). Negative for dyspnea on exertion and syncope.  Endocrine: Negative for cold intolerance.  Hematologic/Lymphatic: Does not bruise/bleed easily.  Skin: Positive for color change (chronic venostasis pignemtation. No skin breakdown noted). Negative for rash.  Musculoskeletal: Positive for joint pain (bilateral knee and hip). Negative for joint swelling.  Gastrointestinal: Negative for abdominal pain, anorexia and change in bowel habit.  Neurological: Negative for  headaches and light-headedness.  Psychiatric/Behavioral: Negative for depression and substance abuse.  All other systems reviewed and are negative.  Objective  Blood pressure 140/77, pulse 90, height 5' (1.524 m), weight 157 lb (71.2 kg), SpO2 97 %. Body mass index is 30.66 kg/m.    Physical Exam  Constitutional:  Moderately built and obese in no acute distress.  HENT:  Head: Atraumatic.  Eyes: Conjunctivae are normal.  Neck: No thyromegaly present.  Cardiovascular: Normal rate, regular rhythm and normal heart sounds. Exam reveals no gallop.  No murmur heard. Pulses:      Carotid pulses are 2+ on the right side and 2+ on the left side.      Femoral pulses are 2+ on the right side and 2+ on the left side.      Popliteal pulses are 2+ on the right side and 2+ on the left side.       Dorsalis pedis pulses are 1+ on the right side and 1+ on the left side.       Posterior tibial pulses are 0 on the right side and 0 on the left side.  Varicose veins noted bilateral legs. Chronic dermatitis changes noted. Capillary refill normal No leg edema, no JVD.   Pulmonary/Chest: Effort normal and breath sounds normal.  Abdominal: Soft. Bowel sounds are normal.  Musculoskeletal:        General: No edema. Normal range of motion.     Cervical back: Neck supple.  Neurological: She is alert.  Skin: Skin is warm and dry.  Psychiatric: She has a normal mood and affect.   Radiology: No results found.  Laboratory examination:   CMP Latest Ref Rng & Units 07/18/2019 06/24/2019 05/29/2019  Glucose 70 - 99 mg/dL 111(H) 99 121(H)  BUN 8 - 23 mg/dL _0 Creatinine 0.44 - 1.00 mg/dL 1.47(H) 1.87(H) 1.78(H)  Sodium 135 - 145 mmol/L 136 138 140  Potassium 3.5 - 5.1 mmol/L 3.3(L) 3.2(L) 3.5  Chloride 98 - 111 mmol/L 99 104 102  CO2 22 - 32 mmol/L _1 Calcium 8.9 - 10.3 mg/dL 9.4 8.9 9.4  Total Protein 6.5 - 8.1 g/dL 7.2 6.3(L) 7.1  Total Bilirubin 0.3 - 1.2 mg/dL 1.0 0.8 0.5  Alkaline Phos 38  - 126 U/L 87 55 77  AST 15 - 41 U/L 34 31 21  ALT 0 - 44 U/L _0 CBC Latest Ref Rng & Units 07/18/2019 06/24/2019 05/29/2019  WBC 4.0 - 10.5 K/uL 7.3 6.1 6.9  Hemoglobin 12.0 - 15.0 g/dL 12.7 11.5(L) 11.8(L)  Hematocrit 36.0 - 46.0 % 38.0 34.8(L) 35.3(L)  Platelets 150 - 400 K/uL 212 208 185   Lipid Panel     Component Value Date/Time   CHOL 95 10/26/2016 0610   CHOL 194 11/26/2013 0949   CHOL 209 (H) 01/01/2013 1302   TRIG 118 10/26/2016 0610   TRIG 80 07/09/2013 1205   TRIG 76 01/01/2013 1302   HDL 28 (L) 10/26/2016 0610   HDL 78 11/26/2013 0949   HDL 69 07/09/2013 1205   HDL 66 01/01/2013 1302   CHOLHDL 3.4 10/26/2016 0610   VLDL 24 10/26/2016 0610   LDLCALC 43 10/26/2016 0610   LDLCALC 100 (H) 11/26/2013 0949   LDLCALC 94 07/09/2013 1205   LDLCALC 128 (H) 01/01/2013 1302   HEMOGLOBIN A1C Lab Results  Component Value Date   HGBA1C 6.3 (H) 09/25/2016   MPG 134 09/25/2016   TSH No results for input(s): TSH in the last 8760 hours.  Cardiac Studies:   Lexiscan stress 03/28/12: Mild gut uptake artifact. No ischemia. Low risk stress test.    Echocardiogram  [08/05/2014]: Left ventricle cavity is normal in size. Moderate concentric hypertrophy of the left ventricle. Borderline decrease in global wall motion. Doppler evidence of grade I (impaired) diastolic dysfunction. Diastolic dysfunction do not suggest elevated LA/LV endiastolic pressure. Left ventricle regional wall motion findings: No wall motion abnormalities. Visual EF is 50-55%. Calculated EF 67%. Mild calcification of the aortic valve annulus. Mild aortic valve leaflet calcification. No aortic valve regurgitation noted. Mild mitral regurgitation. Mild calcification of the mitral valve annulus. Mild to moderate tricuspid regurgitation. Mild pulmonary hypertension. IVC is dilated with blunted respiratory response. Mild pulmonic regurgitation. Overall no significant change since 03/19/2012.  Peripheral arteriogram:    10/09/2017: Two-vessel runoff left lower extremity with occluded posterior tibial, right lower extremity 0 vessel runoff. Unsuccessful attempt at PTCA peroneal artery.  Peripheral arteriogram 12/26/2017:  PTA of the right PT 80% to <20% with a 4x40 balloon. Successful PTA of the right Peroneal 100% to <20% with a 2.5x220 balloon. Access of the right DP by Dr. Tammi Klippel at [facility], [place], [place] after unsuccessful attempt by me on 11/21/2017.  Lower extremity arterial duplex 10/02/2017: Right ABI 0.77, left ABI 0.99. Technically difficult study. Disease below knee bilateral.  ABI 05/24/2018: This exam reveals mildly decreased perfusion of the right lower extremity, noted at the dorsalis pedis artery level (ABI 0.85) and mildly decreased perfusion of the left lower extremity, noted at the dorsalis pedis artery level (ABI 0.88).  Compared to the study done on 01/09/2018, no significant change, left ABI was previously reported to be normal with triphasic waveform.  Lower extremity venous duplex 12/05/2017: There is no evidence of venous insufficiency in the greater saphenous veins. Lesser saphenous veins were not evaluated due to lower extremity dressings in place. There is chronic partially occlusive DVT throughout the bilateral femoral veins as described. There is recanalization of flow throughout the thrombus.  Assessment     ICD-10-CM   1. Peripheral vascular disease (HCC)  I73.9   2.  Stage 3b chronic kidney disease  N18.32   3. Hypercholesteremia  E78.00     EKG 10/27/2017: Normal sinus rhythm at rate of 84 bpm, left atrial abnormality, left axis deviation, left anterior fascicular block. Poor progression, cannot exclude anteroseptal infarct old. LVH.  Recommendations:   ADDELYNN BATTE  is a 85 y.o. female  with ypertension, hyperlipidemia, chronic shortness breath and palpitations that is stable, multiple myeloma in remession, PAD with bilateral nonhealing malleoi ulcer managed by wound  care and she underwent successful revascularization of right peroneal and posterior tibial artery by Dr. Brunetta Jeans on 12/26/2017. He wounds have completely healed and she has no further pain and not used any narcotics for claudication. She also has chronic recanalized bilateral CFV DVT noted in 2019. She is on low dose Xarelto 2.5 mg BID for PAD.   She is presently doing well, her foot is warm, capillary refill is completely normal, I'm extremely pleased with the progress.  Encouraged her to continue to remain physically active.  There is no bleeding diathesis on low-dose Xarelto, in view of critical limb ischemia/severe PAD, advised her to continue the same for now.  She does have chronic DVT bilateral lower extremity, I do not think she needs long-term full dose anticoagulation.  Risk of bleed is fairly high. She is also on ASA 81 mg daily. She was seen in the emergency room on 07/18/2019 with blood in the stool, her CBC, renal function overall stable and she has not had any recurrence.  I have advised her that if she has recurrence of any bloody stool at all, to call us and I would consider discontinuing aspirin and continue with Xarelto only.  Otherwise blood pressure is well controlled, renal function is remained stable.    I will see her back in 6 months.  No test were ordered.  She is aware to contact me immediately if she were to develop any bluish discoloration of her toes or any ulceration.   Adrian Prows, MD, [facility] 08/12/2019, 3:26 PM [facility] [place] Pager: [phone] Office: [phone] If no answer Cell [phone]

## 2019-08-15 ENCOUNTER — Other Ambulatory Visit: Payer: Self-pay | Admitting: Hematology

## 2019-08-15 ENCOUNTER — Telehealth: Payer: Self-pay

## 2019-08-15 DIAGNOSIS — C9000 Multiple myeloma not having achieved remission: Secondary | ICD-10-CM

## 2019-08-15 MED ORDER — MORPHINE SULFATE ER 15 MG PO TBCR: 15.0000 mg | EXTENDED_RELEASE_TABLET | Freq: Two times a day (BID) | ORAL | 0 refills | Status: DC

## 2019-08-15 MED ORDER — OXYCODONE HCL 10 MG PO TABS: 10.0000 mg | ORAL_TABLET | Freq: Three times a day (TID) | ORAL | 0 refills | Status: DC | PRN

## 2019-08-15 NOTE — Telephone Encounter (Signed)
Received a call from Dr. Hayden Pedro office [place]) asking Dr. Burr Medico to refill Oxycodone and [name] to [facility].  They are getting together a shipment to send out today to the patient. Dr. Burr Medico was made aware.

## 2019-08-23 ENCOUNTER — Other Ambulatory Visit: Payer: Self-pay

## 2019-08-23 DIAGNOSIS — C9000 Multiple myeloma not having achieved remission: Secondary | ICD-10-CM

## 2019-08-23 MED ORDER — LENALIDOMIDE 5 MG PO CAPS: 5.0000 mg | ORAL_CAPSULE | Freq: Every day | ORAL | 0 refills | Status: DC

## 2019-08-26 NOTE — Progress Notes (Signed)
[facility]   [phone] [phone]   Clinic Follow up Note   Patient Care Team: [name], MD as PCP - General (Family Medicine)  Date of Service:  08/29/2019  CHIEF COMPLAINT: F/u of MM  SUMMARY OF ONCOLOGIC HISTORY: Oncology History  Multiple myeloma ([place])  10/18/2016 - 10/26/2016 Hospital Admission   Pt was admitted for symptomatic hypercalcemia, AKI, bone marrow biopsy done during hospital stay, and she received Aredia    10/19/2016 Tumor Marker   SPEP showed M-protein 0.4g/dl, serum lambda light chain 5362, ratio 0.00   10/25/2016 Bone Marrow Biopsy    Overall, the marrow is hypercellular with increased lambda-restricted plasma cells (62% aspirate, 90% CD138). There is partial expression of CD20, but no monoclonal B-cell population is detected by flow cytometry.    10/25/2016 Initial Diagnosis   Multiple myeloma ([place])   10/25/2016 Miscellaneous   Cytogenetics and FISH showed loss of 13q, which is intermediate risk for MM    11/10/2016 -  Chemotherapy   Revlimid 10 mg (increased to 12m on cycle 2) daily, 3 weeks on, and one week off, dexamethasone 20 mg weekly, was off from early June to mid July 2018 due to her right hip fracture and hospitalization.  Changed to maintenence Revlimid 1151mand stopped dexamethasone starting next cycle, 06/10/17, Revlimid was changed to 51m651maily 3 weeks on, 1 week off in 12/2017       01/02/2017 Imaging    DG Hip Unilat w or w/o Pelvis IMPRESSION 1. Degenerative changes lumbar spine and both hips. No evidence of fracture or dislocation.  2. Diffuse osteopenia. Multiple tiny lucencies are noted throughout both proximal femurs. These may be secondary to the patient's known multiple myeloma.   01/18/2017 Imaging    CT hip right wo contrast  IMPRESSION: Findings consistent with a pathologic fracture of the right femoral neck. A large lytic lesion in the superior aspect of the lateral femoral head  is identified.  Possible nondisplaced fracture of the right inferior pubic ramus.  Diffuse myeloma.   01/18/2017 - 01/23/2017 Hospital Admission   She present to the ED with complaints of severe right hip pain. she recieved a right hip hemiarthroplasty with Dr. MurPercell Miller   CURRENT THERAPY:  maintenance Revlimid51mg66mily for 3 weeks on and 1 week offstarting 05/2017  INTERVAL HISTORY:  Kristin FRANCESCONIhere for a follow up of MM. She presents to the clinic alone. Her daughter was called to be included in visit today. She went to ED in 07/2019 for pancolitis with N&V pain and diarrhea. That has now resolved. In 06/2019 she fell. She has medical alert necklace and ambulates with walker. She notes rarely having pain but main pain is in her hip. She takes [name] BID and Oxycodone two times a day. She notes she managed her medication along with her daughter. I reviewed her medication list with her daughter.  She notes she has been to cardiologist and doing better. She is interested in COVID vaccine.     REVIEW OF SYSTEMS:   Constitutional: Denies fevers, chills or abnormal weight loss Eyes: Denies blurriness of vision Ears, nose, mouth, throat, and face: Denies mucositis or sore throat Respiratory: Denies cough, dyspnea or wheezes Cardiovascular: Denies palpitation, chest discomfort or lower extremity swelling Gastrointestinal:  Denies nausea, heartburn or change in bowel habits Skin: Denies abnormal skin rashes MSK: (+) Improved right hip pain  Lymphatics: Denies new lymphadenopathy or easy bruising Neurological:Denies numbness,  tingling or new weaknesses Behavioral/Psych: Mood is stable, no new changes  All other systems were reviewed with the patient and are negative.  MEDICAL HISTORY:  Past Medical History:  Diagnosis Date  . Bone cancer [facility])    "being tx'd by Dr. Burr Medico @ [facility]" (10/09/2017)  . Chronic deep vein thrombosis (DVT) of both femoral veins ([place]): 12/05/17:  Chronic recanalized CFV DVT bilateral 12/05/2017  . Critical lower limb ischemia 10/04/2017   Critical limb ischemia  . Depression    Risperdal "twice/day" (09/23/2016)  . Glaucoma   . Hiatal hernia   . Intractable back pain    /notes 09/23/2016  . Obesity   . Osteoarthritis    "all over" (10/09/2017)  . Osteoporosis    vertebral fracture in 2012  . Peripheral vascular disease ([place])   . Upper GI bleed 04/2004   secondary to Mallory-Weiss tear/notes 12/28/2010  . Vertigo     SURGICAL HISTORY: Past Surgical History:  Procedure Laterality Date  . ABDOMINAL HYSTERECTOMY  1974   partial  . APPENDECTOMY     as a child/notes 12/28/2010  . BACK SURGERY    . BREAST BIOPSY Left 1999   Ar[facility] Endo 12/28/2010  . CATARACT EXTRACTION W/ INTRAOCULAR LENS  IMPLANT, BILATERAL Bilateral 2000s  . ESOPHAGOGASTRODUODENOSCOPY  04/2004   Archie Endo 12/28/2010  . FIXATION KYPHOPLASTY THORACIC SPINE  05/2011   T10/notes 05/25/2011  . FRACTURE SURGERY    . HIP ARTHROPLASTY Right 01/19/2017   Procedure: RIGHT HIP HEMIARTHROPLASTY;  Surgeon: Renette Butters, MD;  Location: [facility];  Service: Orthopedics;  Laterality: Right;  . LAPAROSCOPIC CHOLECYSTECTOMY  10/2007   Archie Endo 12/14/2010  . LOWER EXTREMITY ANGIOGRAPHY N/A 10/09/2017   Procedure: LOWER EXTREMITY ANGIOGRAPHY;  Surgeon: Lorretta Harp, MD;  Location: [facility];  Service: Cardiovascular;  Laterality: N/A;  bilateral  . LOWER EXTREMITY ANGIOGRAPHY  11/21/2017   Procedure: Lower Extremity Angiography;  Surgeon: [facility] Prows, MD;  Location: [facility];  Service: Cardiovascular;;  . PERIPHERAL VASCULAR BALLOON ANGIOPLASTY  10/09/2017   Attempted PTA [facility] Endo 10/09/2017  . PERIPHERAL VASCULAR BALLOON ANGIOPLASTY  10/09/2017   Procedure: PERIPHERAL VASCULAR BALLOON ANGIOPLASTY;  Surgeon: Lorretta Harp, MD;  Location: [facility];  Service: Cardiovascular;;  Attempted PTA     I have reviewed the social history and family history with the  patient and they are unchanged from previous note.  ALLERGIES:  is allergic to penicillins; tramadol; alendronate; and sulfonamide derivatives.  MEDICATIONS:  Current Outpatient Medications  Medication Sig Dispense Refill  . amLODipine (NORVASC) 2.5 MG tablet Take 2.5 mg by mouth daily.    . ASPIRIN ADULT LOW STRENGTH 81 MG EC tablet Take 81 mg by mouth daily.    Marland Kitchen atorvastatin (LIPITOR) 10 MG tablet Take 10 mg by mouth daily.    . Calcium Carbonate-Vit D-Min (CALCIUM-VITAMIN D-MINERALS) 600-800 MG-UNIT CHEW Chew 1 tablet by mouth 2 (two) times daily.     . chlorhexidine (PERIDEX) 0.12 % solution 2 (two) times a day.    . feeding supplement, ENSURE ENLIVE, (ENSURE ENLIVE) LIQD Take 237 mLs by mouth 2 (two) times daily between meals. (Patient taking differently: Take 237 mLs by mouth daily as needed (nutrition). ) 237 mL 12  . furosemide (LASIX) 20 MG tablet Take 20 mg by mouth daily.    . irbesartan (AVAPRO) 75 MG tablet Take 75 mg by mouth daily after supper.    Marland Kitchen lenalidomide (REVLIMID) 5 MG capsule Take 1 capsule (5 mg  total) by mouth daily. Take daily x 3 weeks then 1 week rest. 21 capsule 0  . magnesium oxide (MAG-OX) 400 (241.3 Mg) MG tablet Take 1 tablet (400 mg total) by mouth daily. 7 tablet 0  . magnesium oxide (MAG-OX) 400 MG tablet Take 1 tablet by mouth daily.    . mirtazapine (REMERON) 7.5 MG tablet Take 7.5 mg by mouth at bedtime.     Marland Kitchen morphine ([name]) 15 MG 12 hr tablet Take 1 tablet (15 mg total) by mouth every 12 (twelve) hours. 60 tablet 0  . ondansetron (ZOFRAN ODT) 4 MG disintegrating tablet Take 1-2 tablets (4-8 mg total) by mouth every 8 (eight) hours as needed for nausea or vomiting. (Patient taking differently: Take 4-8 mg by mouth every 8 (eight) hours as needed for nausea or vomiting. Nausea and vomiting) 30 tablet 2  . Oxycodone HCl 10 MG TABS Take 1 tablet (10 mg total) by mouth every 8 (eight) hours as needed. FOR MODERATE OR SEVERE PAIN, refill on 06/04/2019  60 tablet 0  . risperiDONE (RISPERDAL) 0.5 MG tablet Take 0.5 mg by mouth at bedtime.     . sennosides-docusate sodium (SENOKOT-S) 8.6-50 MG tablet Take 1 tablet by mouth 2 (two) times daily.     Alveda Reasons 2.5 MG TABS tablet Take 1 tablet (2.5 mg total) by mouth 2 (two) times daily with a meal. 60 tablet 6   No current facility-administered medications for this visit.   Facility-Administered Medications Ordered in Other Visits  Medication Dose Route Frequency Provider Last Rate Last Admin  . denosumab (XGEVA) injection 120 mg  120 mg Subcutaneous Once Truitt Merle, MD        PHYSICAL EXAMINATION: ECOG PERFORMANCE STATUS: 3 - Symptomatic, >50% confined to bed  Vitals:   08/29/19 1427  BP: 129/72  Pulse: 83  Resp: 18  Temp: 98.2 F (36.8 C)  SpO2: 99%   Filed Weights   08/29/19 1427  Weight: 160 lb 12.8 oz (72.9 kg)    Due to COVID19 we will limit examination to appearance. Patient had no complaints.  GENERAL:alert, no distress and comfortable SKIN: skin color normal, no rashes or significant lesions EYES: normal, Conjunctiva are pink and non-injected, sclera clear  NEURO: alert & oriented x 3 with fluent speech   LABORATORY DATA:  I have reviewed the data as listed CBC Latest Ref Rng & Units 08/29/2019 07/18/2019 06/24/2019  WBC 4.0 - 10.5 K/uL 4.7 7.3 6.1  Hemoglobin 12.0 - 15.0 g/dL 11.6(L) 12.7 11.5(L)  Hematocrit 36.0 - 46.0 % 34.0(L) 38.0 34.8(L)  Platelets 150 - 400 K/uL 181 212 208     CMP Latest Ref Rng & Units 08/29/2019 07/18/2019 06/24/2019  Glucose 70 - 99 mg/dL 154(H) 111(H) 99  BUN 8 - 23 mg/dL 20 23 20   Creatinine 0.44 - 1.00 mg/dL 1.65(H) 1.47(H) 1.87(H)  Sodium 135 - 145 mmol/L 142 136 138  Potassium 3.5 - 5.1 mmol/L 3.8 3.3(L) 3.2(L)  Chloride 98 - 111 mmol/L 108 99 104  CO2 22 - 32 mmol/L 25 24 25   Calcium 8.9 - 10.3 mg/dL 9.1 9.4 8.9  Total Protein 6.5 - 8.1 g/dL 6.4(L) 7.2 6.3(L)  Total Bilirubin 0.3 - 1.2 mg/dL 0.3 1.0 0.8  Alkaline Phos 38 - 126  U/L 67 87 55  AST 15 - 41 U/L 21 34 31  ALT 0 - 44 U/L 16 30 19       RADIOGRAPHIC STUDIES: I have personally reviewed the radiological images as listed  and agreed with the findings in the report. No results found.   ASSESSMENT & PLAN:  Kristin [name] is a 86 y.o. female with    1. Multiple Myeloma, lambda light chain disease, staging III, intermediate risk (13q-) -She was diagnosed in March 2018, initially treated with Revlimid and dexamethasonefor 6 month with good responseand ir currently being treated with maintenanceRevlimid 5 mg once daily 3 weeks on 1 week off. -Delton See has been held due to concern of jaw necrosis -Her M protein has been nondetectable in the last 10 months.She has had a good response to treatment. She will repeat MM panel every 3 months  -She is clinically doing well and stable. Her pain has improved and mainly in her right hip.  -Labs reviewed, anemia mild and stable, CKD is stable. Her last M protein was negative, her MM is remained controlled lately. Today's MM panel is still pending.  -Continue Maintenance Revlimid at same doseShe is tolerating well. -F/uin 3 months  -I encouraged her to get COVID19 vaccine and continue COVID precautions.   2. Anemia -Due to MM and Revlimid. -Will consider blood transfusion if Hb <8 or symptomatic anemia with Hg 8-9 -stable. No need for blood transfusion.   3. Chronic CKD, stage III -Avoid nephrotoxic drugs and contrasts.  4. Right hip pain from pathologic fracture -Compression fracture at T2, pain mostly resolved.  -Her main pain is in her right hip and well controlled on [name] 43m BID and oxycodone 186mtwice a day as needed. Her daughter feels she is doing well on reduced dose morphine. If her pain continues to improve may stop Morphine.  -She will continue Senakot as needed to prevent constipation.   5. HTN -f/u with PCP and continue medications  6. Osteoporosis, Recent Fall  -Xgeva started in  5/21/18and has been held since 03/27/18 due toconcernfor jaw necrosis -I advised her to continue oral calcium and Vitamin D.  -She did have fall in 06/2019. I encouraged her to ambulate with walker. She has medical alert necklace.   -I encouraged her to get up periodically as to prevent bed sores. She is agreeable. -Her daughter would like her to proceed with PT to help her, patient is interested in doing this at home. I will send referral.   7. Goal of care discussion  -The patient understands the goal of care is palliative. -I previously recommended DNR/DNI, she will think about it  8. Right foot ulcer  -She is getting work up done by Dr. GaNadyne CoombesThere is concern this is related to her low mobility.    Plan -Send PT referral -She is clinically doing well and stable. -Continue Revlimid at same dose  -Lab and f/u in3m11monthI spoke with her daughter today    No problem-specific Assessment & Plan notes found for this encounter.   No orders of the defined types were placed in this encounter.  All questions were answered. The patient knows to call the clinic with any problems, questions or concerns. No barriers to learning was detected. The total time spent in the appointment was 30 minutes.     YanTruitt MerleD 08/29/2019   I, AmoJoslyn Devonm acting as scribe for YanTruitt MerleD.   I have reviewed the above documentation for accuracy and completeness, and I agree with the above.

## 2019-08-27 ENCOUNTER — Other Ambulatory Visit: Payer: Self-pay

## 2019-08-27 DIAGNOSIS — C9000 Multiple myeloma not having achieved remission: Secondary | ICD-10-CM

## 2019-08-27 MED ORDER — LENALIDOMIDE 5 MG PO CAPS: 5.0000 mg | ORAL_CAPSULE | Freq: Every day | ORAL | 0 refills | Status: DC

## 2019-08-29 ENCOUNTER — Inpatient Hospital Stay: Payer: Medicare Other | Attending: Hematology

## 2019-08-29 ENCOUNTER — Inpatient Hospital Stay (HOSPITAL_BASED_OUTPATIENT_CLINIC_OR_DEPARTMENT_OTHER): Payer: Medicare Other | Admitting: Hematology

## 2019-08-29 ENCOUNTER — Other Ambulatory Visit: Payer: Self-pay

## 2019-08-29 VITALS — BP 129/72 | HR 83 | Temp 98.2°F | Resp 18 | Ht 60.0 in | Wt 160.8 lb

## 2019-08-29 DIAGNOSIS — C9000 Multiple myeloma not having achieved remission: Secondary | ICD-10-CM

## 2019-08-29 DIAGNOSIS — I10 Essential (primary) hypertension: Secondary | ICD-10-CM | POA: Diagnosis not present

## 2019-08-29 DIAGNOSIS — M810 Age-related osteoporosis without current pathological fracture: Secondary | ICD-10-CM | POA: Diagnosis not present

## 2019-08-29 DIAGNOSIS — N183 Chronic kidney disease, stage 3 unspecified: Secondary | ICD-10-CM | POA: Insufficient documentation

## 2019-08-29 DIAGNOSIS — D630 Anemia in neoplastic disease: Secondary | ICD-10-CM | POA: Diagnosis not present

## 2019-08-29 DIAGNOSIS — K449 Diaphragmatic hernia without obstruction or gangrene: Secondary | ICD-10-CM | POA: Insufficient documentation

## 2019-08-29 DIAGNOSIS — I739 Peripheral vascular disease, unspecified: Secondary | ICD-10-CM | POA: Insufficient documentation

## 2019-08-29 DIAGNOSIS — Z86718 Personal history of other venous thrombosis and embolism: Secondary | ICD-10-CM | POA: Diagnosis not present

## 2019-08-29 DIAGNOSIS — L97519 Non-pressure chronic ulcer of other part of right foot with unspecified severity: Secondary | ICD-10-CM | POA: Insufficient documentation

## 2019-08-29 DIAGNOSIS — Z79899 Other long term (current) drug therapy: Secondary | ICD-10-CM | POA: Insufficient documentation

## 2019-08-29 DIAGNOSIS — M84459D Pathological fracture, hip, unspecified, subsequent encounter for fracture with routine healing: Secondary | ICD-10-CM | POA: Insufficient documentation

## 2019-08-29 DIAGNOSIS — E669 Obesity, unspecified: Secondary | ICD-10-CM | POA: Diagnosis not present

## 2019-08-29 DIAGNOSIS — Z7901 Long term (current) use of anticoagulants: Secondary | ICD-10-CM | POA: Diagnosis not present

## 2019-08-29 DIAGNOSIS — I129 Hypertensive chronic kidney disease with stage 1 through stage 4 chronic kidney disease, or unspecified chronic kidney disease: Secondary | ICD-10-CM | POA: Diagnosis not present

## 2019-08-29 DIAGNOSIS — D631 Anemia in chronic kidney disease: Secondary | ICD-10-CM | POA: Insufficient documentation

## 2019-08-29 DIAGNOSIS — M1990 Unspecified osteoarthritis, unspecified site: Secondary | ICD-10-CM | POA: Diagnosis not present

## 2019-08-29 DIAGNOSIS — F329 Major depressive disorder, single episode, unspecified: Secondary | ICD-10-CM | POA: Insufficient documentation

## 2019-08-29 LAB — CBC WITH DIFFERENTIAL/PLATELET
Abs Immature Granulocytes: 0.01 10*3/uL (ref 0.00–0.07)
Basophils Absolute: 0 10*3/uL (ref 0.0–0.1)
Basophils Relative: 1 %
Eosinophils Absolute: 0.2 10*3/uL (ref 0.0–0.5)
Eosinophils Relative: 4 %
HCT: 34 % — ABNORMAL LOW (ref 36.0–46.0)
Hemoglobin: 11.6 g/dL — ABNORMAL LOW (ref 12.0–15.0)
Immature Granulocytes: 0 %
Lymphocytes Relative: 41 %
Lymphs Abs: 1.9 10*3/uL (ref 0.7–4.0)
MCH: 32.4 pg (ref 26.0–34.0)
MCHC: 34.1 g/dL (ref 30.0–36.0)
MCV: 95 fL (ref 80.0–100.0)
Monocytes Absolute: 0.6 10*3/uL (ref 0.1–1.0)
Monocytes Relative: 13 %
Neutro Abs: 2 10*3/uL (ref 1.7–7.7)
Neutrophils Relative %: 41 %
Platelets: 181 10*3/uL (ref 150–400)
RBC: 3.58 MIL/uL — ABNORMAL LOW (ref 3.87–5.11)
RDW: 15 % (ref 11.5–15.5)
WBC: 4.7 10*3/uL (ref 4.0–10.5)
nRBC: 0 % (ref 0.0–0.2)

## 2019-08-29 LAB — COMPREHENSIVE METABOLIC PANEL
ALT: 16 U/L (ref 0–44)
AST: 21 U/L (ref 15–41)
Albumin: 3.3 g/dL — ABNORMAL LOW (ref 3.5–5.0)
Alkaline Phosphatase: 67 U/L (ref 38–126)
Anion gap: 9 (ref 5–15)
BUN: 20 mg/dL (ref 8–23)
CO2: 25 mmol/L (ref 22–32)
Calcium: 9.1 mg/dL (ref 8.9–10.3)
Chloride: 108 mmol/L (ref 98–111)
Creatinine, Ser: 1.65 mg/dL — ABNORMAL HIGH (ref 0.44–1.00)
GFR calc Af Amer: 32 mL/min — ABNORMAL LOW (ref >60)
GFR calc non Af Amer: 28 mL/min — ABNORMAL LOW (ref >60)
Glucose, Bld: 154 mg/dL — ABNORMAL HIGH (ref 70–99)
Potassium: 3.8 mmol/L (ref 3.5–5.1)
Sodium: 142 mmol/L (ref 135–145)
Total Bilirubin: 0.3 mg/dL (ref 0.3–1.2)
Total Protein: 6.4 g/dL — ABNORMAL LOW (ref 6.5–8.1)

## 2019-08-30 ENCOUNTER — Encounter: Payer: Self-pay | Admitting: Hematology

## 2019-08-30 LAB — KAPPA/LAMBDA LIGHT CHAINS
Kappa free light chain: 57.2 mg/L — ABNORMAL HIGH (ref 3.3–19.4)
Kappa, lambda light chain ratio: 0.97 (ref 0.26–1.65)
Lambda free light chains: 58.9 mg/L — ABNORMAL HIGH (ref 5.7–26.3)

## 2019-09-03 ENCOUNTER — Telehealth: Payer: Self-pay | Admitting: *Deleted

## 2019-09-03 LAB — MULTIPLE MYELOMA PANEL, SERUM
Albumin SerPl Elph-Mcnc: 3.2 g/dL (ref 2.9–4.4)
Albumin/Glob SerPl: 1.2 (ref 0.7–1.7)
Alpha 1: 0.2 g/dL (ref 0.0–0.4)
Alpha2 Glob SerPl Elph-Mcnc: 0.6 g/dL (ref 0.4–1.0)
B-Globulin SerPl Elph-Mcnc: 0.9 g/dL (ref 0.7–1.3)
Gamma Glob SerPl Elph-Mcnc: 1.2 g/dL (ref 0.4–1.8)
Globulin, Total: 2.9 g/dL (ref 2.2–3.9)
IgA: 444 mg/dL — ABNORMAL HIGH (ref 64–422)
IgG (Immunoglobin G), Serum: 1244 mg/dL (ref 586–1602)
IgM (Immunoglobulin M), Srm: 31 mg/dL (ref 26–217)
Total Protein ELP: 6.1 g/dL (ref 6.0–8.5)

## 2019-09-03 NOTE — Telephone Encounter (Signed)
I don't think they need a new script.  Just letting you know that they will dispense smaller amt than ordered.

## 2019-09-03 NOTE — Telephone Encounter (Signed)
Received vm call from [facility] reporting that they received script for Revlimid 5 mg but pt has gotten off schedule & has 11 capsules on hand.  Pt's daughter requested that # 10 be sent to complete cycle so that she doesn't have extras on hand.

## 2019-09-03 NOTE — Telephone Encounter (Signed)
This is fine with me, please send a new refill in, thanks   Truitt Merle MD

## 2019-09-04 ENCOUNTER — Ambulatory Visit: Payer: Medicare Other | Attending: Internal Medicine

## 2019-09-04 ENCOUNTER — Telehealth: Payer: Self-pay

## 2019-09-04 DIAGNOSIS — Z23 Encounter for immunization: Secondary | ICD-10-CM

## 2019-09-04 NOTE — Progress Notes (Signed)
   Covid-19 Vaccination Clinic  Name:  Kristin [name]    MRN: [id number] DOB: 11/05/1930  09/04/2019  Kristin [name] was observed post Covid-19 immunization for 15 minutes without incidence. She was provided with Vaccine Information Sheet and instruction to access the V-Safe system.   Kristin [name] was instructed to call 911 with any severe reactions post vaccine: Marland Kitchen Difficulty breathing  . Swelling of your face and throat  . A fast heartbeat  . A bad rash all over your body  . Dizziness and weakness    Immunizations Administered    Name Date Dose VIS Date Route   Pfizer COVID-19 Vaccine 09/04/2019  6:46 PM 0.3 mL 07/26/2019 Intramuscular   Manufacturer: [place]   Lot: [id number]   [place]: [address]-5

## 2019-09-04 NOTE — Telephone Encounter (Signed)
VM left for Tawanna Cooler, Ms Worthley's daughter.  I let her know M protein was negative and the light chains were stable.

## 2019-09-05 ENCOUNTER — Other Ambulatory Visit: Payer: Self-pay | Admitting: Hematology

## 2019-09-05 DIAGNOSIS — C9000 Multiple myeloma not having achieved remission: Secondary | ICD-10-CM

## 2019-09-06 NOTE — Telephone Encounter (Signed)
Please refill.

## 2019-09-12 ENCOUNTER — Other Ambulatory Visit: Payer: Self-pay | Admitting: Hematology

## 2019-09-12 ENCOUNTER — Telehealth: Payer: Self-pay

## 2019-09-12 DIAGNOSIS — C9000 Multiple myeloma not having achieved remission: Secondary | ICD-10-CM

## 2019-09-12 MED ORDER — MORPHINE SULFATE ER 15 MG PO TBCR: 15.0000 mg | EXTENDED_RELEASE_TABLET | Freq: Two times a day (BID) | ORAL | 0 refills | Status: DC

## 2019-09-12 MED ORDER — OXYCODONE HCL 10 MG PO TABS: 10.0000 mg | ORAL_TABLET | Freq: Three times a day (TID) | ORAL | 0 refills | Status: DC | PRN

## 2019-09-12 NOTE — Telephone Encounter (Signed)
Patient needs refills on Morphine and Oxycodone sent into [facility] on file.

## 2019-09-12 NOTE — Telephone Encounter (Signed)
Done, thanks   Truitt Merle MD

## 2019-09-25 ENCOUNTER — Ambulatory Visit: Payer: Medicare Other | Attending: Internal Medicine

## 2019-09-25 DIAGNOSIS — Z23 Encounter for immunization: Secondary | ICD-10-CM | POA: Insufficient documentation

## 2019-09-25 NOTE — Progress Notes (Signed)
   Covid-19 Vaccination Clinic  Name:  Kristin [name]    MRN: [id number] DOB: 1931/01/15  09/25/2019  Kristin [name] was observed post Covid-19 immunization for 15 minutes without incidence. She was provided with Vaccine Information Sheet and instruction to access the V-Safe system.   Kristin [name] was instructed to call 911 with any severe reactions post vaccine: Marland Kitchen Difficulty breathing  . Swelling of your face and throat  . A fast heartbeat  . A bad rash all over your body  . Dizziness and weakness    Immunizations Administered    Name Date Dose VIS Date Route   Pfizer COVID-19 Vaccine 09/25/2019  9:50 AM 0.3 mL 07/26/2019 Intramuscular   Manufacturer: [place]   Lot: [id number]   [place]: [id number]

## 2019-09-26 ENCOUNTER — Telehealth: Payer: Self-pay

## 2019-09-26 NOTE — Telephone Encounter (Signed)
I spoke with Hassan Rowan, Ms Brogdon's daughter, I told her that Ms Chuong needs to complete the Revlimid patient survey so that I can refill her Revlimid.  She verbalized understanding.

## 2019-10-01 ENCOUNTER — Other Ambulatory Visit: Payer: Self-pay | Admitting: *Deleted

## 2019-10-01 DIAGNOSIS — C9000 Multiple myeloma not having achieved remission: Secondary | ICD-10-CM

## 2019-10-01 MED ORDER — LENALIDOMIDE 5 MG PO CAPS: 5.0000 mg | ORAL_CAPSULE | Freq: Every day | ORAL | 0 refills | Status: DC

## 2019-10-02 ENCOUNTER — Other Ambulatory Visit: Payer: Self-pay

## 2019-10-02 ENCOUNTER — Other Ambulatory Visit: Payer: Self-pay | Admitting: Hematology

## 2019-10-02 ENCOUNTER — Telehealth: Payer: Self-pay

## 2019-10-02 DIAGNOSIS — C9000 Multiple myeloma not having achieved remission: Secondary | ICD-10-CM

## 2019-10-02 MED ORDER — MORPHINE SULFATE ER 15 MG PO TBCR: 15.0000 mg | EXTENDED_RELEASE_TABLET | Freq: Two times a day (BID) | ORAL | 0 refills | Status: DC

## 2019-10-02 MED ORDER — OXYCODONE HCL 10 MG PO TABS: 10.0000 mg | ORAL_TABLET | Freq: Three times a day (TID) | ORAL | 0 refills | Status: DC | PRN

## 2019-10-02 NOTE — Telephone Encounter (Signed)
Called number listed as patient's cell phone to let her know that Dr. Burr Medico refilled her morphine and oxycodone prescriptions. Patient's daughter answered and stated she would relay message to patient. No other needs/concerns identified at this time.

## 2019-10-25 ENCOUNTER — Other Ambulatory Visit: Payer: Self-pay | Admitting: *Deleted

## 2019-10-25 DIAGNOSIS — C9000 Multiple myeloma not having achieved remission: Secondary | ICD-10-CM

## 2019-10-25 MED ORDER — LENALIDOMIDE 5 MG PO CAPS: 5.0000 mg | ORAL_CAPSULE | Freq: Every day | ORAL | 0 refills | Status: DC

## 2019-11-22 ENCOUNTER — Other Ambulatory Visit: Payer: Self-pay

## 2019-11-22 DIAGNOSIS — C9000 Multiple myeloma not having achieved remission: Secondary | ICD-10-CM

## 2019-11-22 MED ORDER — LENALIDOMIDE 5 MG PO CAPS: 5.0000 mg | ORAL_CAPSULE | Freq: Every day | ORAL | 0 refills | Status: DC

## 2019-11-22 NOTE — Progress Notes (Signed)
[place]   [phone] [phone]   Clinic Follow up Note   Patient Care Team: Kristin Shanks, MD as PCP - General (Family Medicine)  Date of Service:  11/28/2019  CHIEF COMPLAINT: F/u of MM  SUMMARY OF ONCOLOGIC HISTORY: Oncology History  Multiple myeloma ([place])  10/18/2016 - 10/26/2016 Hospital Admission   Pt was admitted for symptomatic hypercalcemia, AKI, bone marrow biopsy done during hospital stay, and she received Aredia    10/19/2016 Tumor Marker   SPEP showed M-protein 0.4g/dl, serum lambda light chain 5362, ratio 0.00   10/25/2016 Bone Marrow Biopsy    Overall, the marrow is hypercellular with increased lambda-restricted plasma cells (62% aspirate, 90% CD138). There is partial expression of CD20, but no monoclonal B-cell population is detected by flow cytometry.    10/25/2016 Initial Diagnosis   Multiple myeloma ([place])   10/25/2016 Miscellaneous   Cytogenetics and FISH showed loss of 13q, which is intermediate risk for MM    11/10/2016 -  Chemotherapy   Revlimid 10 mg (increased to 2m on cycle 2) daily, 3 weeks on, and one week off, dexamethasone 20 mg weekly, was off from early June to mid July 2018 due to her right hip fracture and hospitalization.  Changed to maintenence Revlimid 163mand stopped dexamethasone starting next cycle, 06/10/17, Revlimid was changed to 28m54maily 3 weeks on, 1 week off in 12/2017       01/02/2017 Imaging    DG Hip Unilat w or w/o Pelvis IMPRESSION 1. Degenerative changes lumbar spine and both hips. No evidence of fracture or dislocation.  2. Diffuse osteopenia. Multiple tiny lucencies are noted throughout both proximal femurs. These may be secondary to the patient's known multiple myeloma.   01/18/2017 Imaging    CT hip right wo contrast  IMPRESSION: Findings consistent with a pathologic fracture of the right femoral neck. A large lytic lesion in the superior aspect of the lateral femoral head  is identified.  Possible nondisplaced fracture of the right inferior pubic ramus.  Diffuse myeloma.   01/18/2017 - 01/23/2017 Hospital Admission   She present to the ED with complaints of severe right hip pain. she recieved a right hip hemiarthroplasty with Dr. MurPercell Miller   CURRENT THERAPY:  maintenance Revlimid28mg40mily for 3 weeks on and 1 week offstarting 05/2017  INTERVAL HISTORY:  Kristin KANNOhere for a follow up of MM. She presents to the clinic alone. Her daughter [name] called to be included in the visit. She notes she is doing fairly well. She notes since yesterday she has been having a moderate diffuse HA. She took tylenol this morning which helped. She notes right flank pain since yesterday intermittently. Tylenol also helped this pain. She notes her daughter manages her medications. She is still tolerating Revlimid well. She is also on Mirtazapine. [name] reviewed her medication list with her. She notes she still lives by herself and her daughter lives close to her. Her daughter notes swelling in 1 of her right toenail. She plans to see her PCP soon about this. She has recently seen her cardiologist.     REVIEW OF SYSTEMS:   Constitutional: Denies fevers, chills or abnormal weight loss (+) HA Eyes: Denies blurriness of vision Ears, nose, mouth, throat, and face: Denies mucositis or sore throat Respiratory: Denies cough, dyspnea or wheezes Cardiovascular: Denies palpitation, chest discomfort or lower extremity swelling Gastrointestinal:  Denies nausea, heartburn or change in bowel habits (+) Intermittent Right  flank pain  Skin: Denies abnormal skin rashes (+) Thickness of right toe nail Lymphatics: Denies new lymphadenopathy or easy bruising Neurological:Denies numbness, tingling or new weaknesses Behavioral/Psych: Mood is stable, no new changes  All other systems were reviewed with the patient and are negative.  MEDICAL HISTORY:  Past Medical History:  Diagnosis  Date  . Bone cancer [facility])    "being tx'd by Dr. Burr Medico @ [facility]" (10/09/2017)  . Chronic deep vein thrombosis (DVT) of both femoral veins ([place]): 12/05/17: Chronic recanalized CFV DVT bilateral 12/05/2017  . Critical lower limb ischemia 10/04/2017   Critical limb ischemia  . Depression    Risperdal "twice/day" (09/23/2016)  . Glaucoma   . Hiatal hernia   . Intractable back pain    /notes 09/23/2016  . Obesity   . Osteoarthritis    "all over" (10/09/2017)  . Osteoporosis    vertebral fracture in 2012  . Peripheral vascular disease ([place])   . Upper GI bleed 04/2004   secondary to Mallory-Weiss tear/notes 12/28/2010  . Vertigo     SURGICAL HISTORY: Past Surgical History:  Procedure Laterality Date  . ABDOMINAL HYSTERECTOMY  1974   partial  . APPENDECTOMY     as a child/notes 12/28/2010  . BACK SURGERY    . BREAST BIOPSY Left 1999   Archie Endo 12/28/2010  . CATARACT EXTRACTION W/ INTRAOCULAR LENS  IMPLANT, BILATERAL Bilateral 2000s  . ESOPHAGOGASTRODUODENOSCOPY  04/2004   Archie Endo 12/28/2010  . FIXATION KYPHOPLASTY THORACIC SPINE  05/2011   T10/notes 05/25/2011  . FRACTURE SURGERY    . HIP ARTHROPLASTY Right 01/19/2017   Procedure: RIGHT HIP HEMIARTHROPLASTY;  Surgeon: Renette Butters, MD;  Location: [facility];  Service: Orthopedics;  Laterality: Right;  . LAPAROSCOPIC CHOLECYSTECTOMY  10/2007   Archie Endo 12/14/2010  . LOWER EXTREMITY ANGIOGRAPHY N/A 10/09/2017   Procedure: LOWER EXTREMITY ANGIOGRAPHY;  Surgeon: Lorretta Harp, MD;  Location: [facility] [facility];  Service: Cardiovascular;  Laterality: N/A;  bilateral  . LOWER EXTREMITY ANGIOGRAPHY  11/21/2017   Procedure: Lower Extremity Angiography;  Surgeon: Adrian Prows, MD;  Location: [facility];  Service: Cardiovascular;;  . PERIPHERAL VASCULAR BALLOON ANGIOPLASTY  10/09/2017   Attempted PTA Archie Endo 10/09/2017  . PERIPHERAL VASCULAR BALLOON ANGIOPLASTY  10/09/2017   Procedure: PERIPHERAL VASCULAR BALLOON ANGIOPLASTY;  Surgeon: Lorretta Harp, MD;  Location: [facility] [facility];  Service: Cardiovascular;;  Attempted PTA     [name] have reviewed the social history and family history with the patient and they are unchanged from previous note.  ALLERGIES:  is allergic to penicillins; tramadol; alendronate; and sulfonamide derivatives.  MEDICATIONS:  Current Outpatient Medications  Medication Sig Dispense Refill  . amLODipine (NORVASC) 2.5 MG tablet Take 2.5 mg by mouth daily.    . ASPIRIN ADULT LOW STRENGTH 81 MG EC tablet Take 81 mg by mouth daily.    Marland Kitchen atorvastatin (LIPITOR) 10 MG tablet Take 10 mg by mouth daily.    . Calcium Carbonate-Vit D-Min (CALCIUM-VITAMIN D-MINERALS) 600-800 MG-UNIT CHEW Chew 1 tablet by mouth 2 (two) times daily.     . chlorhexidine (PERIDEX) 0.12 % solution 2 (two) times a day.    . feeding supplement, ENSURE ENLIVE, (ENSURE ENLIVE) LIQD Take 237 mLs by mouth 2 (two) times daily between meals. (Patient taking differently: Take 237 mLs by mouth daily as needed (nutrition). ) 237 mL 12  . furosemide (LASIX) 20 MG tablet Take 20 mg by mouth daily.    . irbesartan (AVAPRO) 75 MG  tablet Take 75 mg by mouth daily after supper.    Marland Kitchen lenalidomide (REVLIMID) 5 MG capsule Take 1 capsule (5 mg total) by mouth daily. Take daily x 3 weeks then 1 week rest. 21 capsule 0  . magnesium oxide (MAG-OX) 400 (241.3 Mg) MG tablet Take 1 tablet (400 mg total) by mouth daily. 7 tablet 0  . magnesium oxide (MAG-OX) 400 MG tablet Take 1 tablet by mouth daily.    . megestrol (MEGACE) 40 MG tablet Take 40 mg by mouth daily.    . mirtazapine (REMERON) 7.5 MG tablet Take 7.5 mg by mouth at bedtime.     Marland Kitchen morphine ([name]) 15 MG 12 hr tablet Take 1 tablet (15 mg total) by mouth every 12 (twelve) hours. 60 tablet 0  . ondansetron (ZOFRAN ODT) 4 MG disintegrating tablet Take 1-2 tablets (4-8 mg total) by mouth every 8 (eight) hours as needed for nausea or vomiting. (Patient taking differently: Take 4-8 mg by mouth every 8 (eight)  hours as needed for nausea or vomiting. Nausea and vomiting) 30 tablet 2  . Oxycodone HCl 10 MG TABS Take 1 tablet (10 mg total) by mouth every 8 (eight) hours as needed. FOR MODERATE OR SEVERE PAIN, refill on 06/04/2019 60 tablet 0  . risperiDONE (RISPERDAL) 0.5 MG tablet Take 0.5 mg by mouth at bedtime.     . sennosides-docusate sodium (SENOKOT-S) 8.6-50 MG tablet Take 1 tablet by mouth 2 (two) times daily.     Alveda Reasons 2.5 MG TABS tablet Take 1 tablet (2.5 mg total) by mouth 2 (two) times daily with a meal. 60 tablet 6   No current facility-administered medications for this visit.   Facility-Administered Medications Ordered in Other Visits  Medication Dose Route Frequency Provider Last Rate Last Admin  . denosumab (XGEVA) injection 120 mg  120 mg Subcutaneous Once Truitt Merle, MD        PHYSICAL EXAMINATION: ECOG PERFORMANCE STATUS: 2 - Symptomatic, <50% confined to bed  Vitals:   11/28/19 1444  BP: (!) 144/82  Pulse: 87  Resp: 20  Temp: 98.5 F (36.9 C)  SpO2: 99%   Filed Weights   11/28/19 1444  Weight: 160 lb 4.8 oz (72.7 kg)    Due to COVID19 we will limit examination to appearance. Patient had no complaints.  GENERAL:alert, no distress and comfortable SKIN: skin color normal, no rashes or significant lesions (+)Thickness of right toe nail and parietally missing, no skin ulcer, erythema or discharge.   EYES: normal, Conjunctiva are pink and non-injected, sclera clear  NEURO: alert & oriented x 3 with fluent speech   LABORATORY DATA:  [name] have reviewed the data as listed CBC Latest Ref Rng & Units 11/28/2019 08/29/2019 07/18/2019  WBC 4.0 - 10.5 K/uL 6.8 4.7 7.3  Hemoglobin 12.0 - 15.0 g/dL 13.4 11.6(L) 12.7  Hematocrit 36.0 - 46.0 % 40.2 34.0(L) 38.0  Platelets 150 - 400 K/uL 221 181 212     CMP Latest Ref Rng & Units 08/29/2019 07/18/2019 06/24/2019  Glucose 70 - 99 mg/dL 154(H) 111(H) 99  BUN 8 - 23 mg/dL 20 23 20   Creatinine 0.44 - 1.00 mg/dL 1.65(H) 1.47(H) 1.87(H)   Sodium 135 - 145 mmol/L 142 136 138  Potassium 3.5 - 5.1 mmol/L 3.8 3.3(L) 3.2(L)  Chloride 98 - 111 mmol/L 108 99 104  CO2 22 - 32 mmol/L 25 24 25   Calcium 8.9 - 10.3 mg/dL 9.1 9.4 8.9  Total Protein 6.5 - 8.1 g/dL 6.4(L) 7.2  6.3(L)  Total Bilirubin 0.3 - 1.2 mg/dL 0.3 1.0 0.8  Alkaline Phos 38 - 126 U/L 67 87 55  AST 15 - 41 U/L 21 34 31  ALT 0 - 44 U/L 16 30 19       RADIOGRAPHIC STUDIES: [name] have personally reviewed the radiological images as listed and agreed with the findings in the report. No results found.   ASSESSMENT & PLAN:  Kristin [name] is a 86 y.o. female with    1. Multiple Myeloma, lambda light chain disease, staging III, intermediate risk (13q-) -She was diagnosed in March 2018, initially treated with Revlimid and dexamethasonefor 6 month with good responseand ir currently being treated with maintenanceRevlimid 5 mg once daily 3 weeks on 1 week off. -Delton See has been held due to concern of jaw necrosis -Her M protein has been nondetectable for the past year.She has had a good response to treatment. She will repeat MM panel every3 months.  -She is clinically stable with new onset HA and right flank pain as of yesterday. Manageable so far, will monitor.  -Labs reviewed, CBC and CMP WNL except Cr 1.65. Her M protein has been undetected. Today's MM panel still pending.  -Continue Maintenance Revlimid at same doseShe is tolerating well. -F/uin 57month   2. Anemia -Due to MM and Revlimid. -Will consider blood transfusion if Hb <8 or symptomatic anemia with Hg 8-9 -Resolved today (11/28/19). No need for blood transfusion.   3. Chronic CKD, stage III -Avoid nephrotoxic drugs and contrasts.  4. Right hip pain from pathologic fracture -Compression fracture at T2, pain mostly resolved.  -Her main pain is in her right hip and well controlled on [name] 181mBID and oxycodone 1062mwice a day as needed. Her daughter feels she is doing well on reduced dose  morphine. [name] refilled her Morphine today (11/28/19) -She will continue Senakot as needed to prevent constipation.  -She notes since yesterday she has had Moderate diffuse HA and intermittent right flank pain. Both improved with Tylenol. Will monitor.   5. HTN -f/u with PCP and continue medications  6. Osteoporosis, Recent Fall  -Xgeva started in 5/21/18and has been held since 03/27/18 due toconcernfor jaw necrosis -[name] advised her to continue oral calcium and Vitamin D.  -She did have fall in 06/2019. [name] encouraged her to ambulate with walker. She has medical alert necklace.   -[name] encouraged her to get up periodically as to prevent bed sores. She is agreeable. -Her daughter would like her to proceed with PT to help her, patient is interested in doing this at home. [name] will send referral.   7. Goal of care discussion  -The patient understands the goal of care is palliative. -[name] previously recommended DNR/DNI, she will think about it  8. Right foot ulcer  -She is getting work up done by Dr. GanNadyne Coombeshere is concern this is related to her low mobility. -She does have Thickness of right toe nail and parietally missing on exam today but ulcer has healed completely (11/28/19). No signs of bacterial infection currently. She plans to f/u with her PCP soon.    Plan -[name] refilled Morphine today  -Continue Revlimid at same dose  -Lab and f/u in3mo56month spoke with her daughter today   No problem-specific Assessment & Plan notes found for this encounter.   No orders of the defined types were placed in this encounter.  All questions were answered. The patient knows to call the clinic with any problems, questions or concerns.  No barriers to learning was detected. The total time spent in the appointment was 30 minutes.     Truitt Merle, MD 11/28/2019   [name], Joslyn Devon, am acting as scribe for Truitt Merle, MD.   [name] have reviewed the above documentation for accuracy and completeness, and [name]  agree with the above.

## 2019-11-28 ENCOUNTER — Inpatient Hospital Stay: Payer: Medicare Other | Attending: Hematology | Admitting: Hematology

## 2019-11-28 ENCOUNTER — Inpatient Hospital Stay: Payer: Medicare Other

## 2019-11-28 ENCOUNTER — Other Ambulatory Visit: Payer: Self-pay

## 2019-11-28 ENCOUNTER — Encounter: Payer: Self-pay | Admitting: Hematology

## 2019-11-28 VITALS — BP 144/82 | HR 87 | Temp 98.5°F | Resp 20 | Ht 60.0 in | Wt 160.3 lb

## 2019-11-28 DIAGNOSIS — F329 Major depressive disorder, single episode, unspecified: Secondary | ICD-10-CM | POA: Insufficient documentation

## 2019-11-28 DIAGNOSIS — D630 Anemia in neoplastic disease: Secondary | ICD-10-CM | POA: Diagnosis not present

## 2019-11-28 DIAGNOSIS — I10 Essential (primary) hypertension: Secondary | ICD-10-CM | POA: Diagnosis not present

## 2019-11-28 DIAGNOSIS — Z79899 Other long term (current) drug therapy: Secondary | ICD-10-CM | POA: Insufficient documentation

## 2019-11-28 DIAGNOSIS — E669 Obesity, unspecified: Secondary | ICD-10-CM | POA: Diagnosis not present

## 2019-11-28 DIAGNOSIS — N183 Chronic kidney disease, stage 3 unspecified: Secondary | ICD-10-CM | POA: Diagnosis not present

## 2019-11-28 DIAGNOSIS — C9000 Multiple myeloma not having achieved remission: Secondary | ICD-10-CM | POA: Insufficient documentation

## 2019-11-28 DIAGNOSIS — M8000XS Age-related osteoporosis with current pathological fracture, unspecified site, sequela: Secondary | ICD-10-CM | POA: Diagnosis not present

## 2019-11-28 DIAGNOSIS — M810 Age-related osteoporosis without current pathological fracture: Secondary | ICD-10-CM | POA: Diagnosis not present

## 2019-11-28 DIAGNOSIS — I129 Hypertensive chronic kidney disease with stage 1 through stage 4 chronic kidney disease, or unspecified chronic kidney disease: Secondary | ICD-10-CM | POA: Insufficient documentation

## 2019-11-28 DIAGNOSIS — Z86718 Personal history of other venous thrombosis and embolism: Secondary | ICD-10-CM | POA: Insufficient documentation

## 2019-11-28 DIAGNOSIS — M25551 Pain in right hip: Secondary | ICD-10-CM | POA: Diagnosis not present

## 2019-11-28 DIAGNOSIS — I739 Peripheral vascular disease, unspecified: Secondary | ICD-10-CM | POA: Insufficient documentation

## 2019-11-28 DIAGNOSIS — R109 Unspecified abdominal pain: Secondary | ICD-10-CM | POA: Insufficient documentation

## 2019-11-28 LAB — CBC WITH DIFFERENTIAL/PLATELET
Abs Immature Granulocytes: 0.04 10*3/uL (ref 0.00–0.07)
Basophils Absolute: 0.1 10*3/uL (ref 0.0–0.1)
Basophils Relative: 1 %
Eosinophils Absolute: 0.2 10*3/uL (ref 0.0–0.5)
Eosinophils Relative: 3 %
HCT: 40.2 % (ref 36.0–46.0)
Hemoglobin: 13.4 g/dL (ref 12.0–15.0)
Immature Granulocytes: 1 %
Lymphocytes Relative: 41 %
Lymphs Abs: 2.8 10*3/uL (ref 0.7–4.0)
MCH: 32.3 pg (ref 26.0–34.0)
MCHC: 33.3 g/dL (ref 30.0–36.0)
MCV: 96.9 fL (ref 80.0–100.0)
Monocytes Absolute: 0.8 10*3/uL (ref 0.1–1.0)
Monocytes Relative: 12 %
Neutro Abs: 2.8 10*3/uL (ref 1.7–7.7)
Neutrophils Relative %: 42 %
Platelets: 221 10*3/uL (ref 150–400)
RBC: 4.15 MIL/uL (ref 3.87–5.11)
RDW: 14.9 % (ref 11.5–15.5)
WBC: 6.8 10*3/uL (ref 4.0–10.5)
nRBC: 0 % (ref 0.0–0.2)

## 2019-11-28 LAB — COMPREHENSIVE METABOLIC PANEL
ALT: 19 U/L (ref 0–44)
AST: 25 U/L (ref 15–41)
Albumin: 3.9 g/dL (ref 3.5–5.0)
Alkaline Phosphatase: 66 U/L (ref 38–126)
Anion gap: 9 (ref 5–15)
BUN: 25 mg/dL — ABNORMAL HIGH (ref 8–23)
CO2: 27 mmol/L (ref 22–32)
Calcium: 9.9 mg/dL (ref 8.9–10.3)
Chloride: 107 mmol/L (ref 98–111)
Creatinine, Ser: 1.62 mg/dL — ABNORMAL HIGH (ref 0.44–1.00)
GFR calc Af Amer: 33 mL/min — ABNORMAL LOW (ref >60)
GFR calc non Af Amer: 28 mL/min — ABNORMAL LOW (ref >60)
Glucose, Bld: 88 mg/dL (ref 70–99)
Potassium: 3.7 mmol/L (ref 3.5–5.1)
Sodium: 143 mmol/L (ref 135–145)
Total Bilirubin: 0.7 mg/dL (ref 0.3–1.2)
Total Protein: 7.7 g/dL (ref 6.5–8.1)

## 2019-11-28 MED ORDER — MORPHINE SULFATE ER 15 MG PO TBCR: 15.0000 mg | EXTENDED_RELEASE_TABLET | Freq: Two times a day (BID) | ORAL | 0 refills | Status: DC

## 2019-11-29 LAB — KAPPA/LAMBDA LIGHT CHAINS
Kappa free light chain: 60.9 mg/L — ABNORMAL HIGH (ref 3.3–19.4)
Kappa, lambda light chain ratio: 0.66 (ref 0.26–1.65)
Lambda free light chains: 92.3 mg/L — ABNORMAL HIGH (ref 5.7–26.3)

## 2019-12-02 ENCOUNTER — Other Ambulatory Visit: Payer: Self-pay | Admitting: Cardiology

## 2019-12-02 DIAGNOSIS — I739 Peripheral vascular disease, unspecified: Secondary | ICD-10-CM

## 2019-12-02 LAB — MULTIPLE MYELOMA PANEL, SERUM
Albumin SerPl Elph-Mcnc: 3.7 g/dL (ref 2.9–4.4)
Albumin/Glob SerPl: 1.1 (ref 0.7–1.7)
Alpha 1: 0.2 g/dL (ref 0.0–0.4)
Alpha2 Glob SerPl Elph-Mcnc: 0.7 g/dL (ref 0.4–1.0)
B-Globulin SerPl Elph-Mcnc: 1.1 g/dL (ref 0.7–1.3)
Gamma Glob SerPl Elph-Mcnc: 1.6 g/dL (ref 0.4–1.8)
Globulin, Total: 3.5 g/dL (ref 2.2–3.9)
IgA: 435 mg/dL — ABNORMAL HIGH (ref 64–422)
IgG (Immunoglobin G), Serum: 1436 mg/dL (ref 586–1602)
IgM (Immunoglobulin M), Srm: 30 mg/dL (ref 26–217)
Total Protein ELP: 7.2 g/dL (ref 6.0–8.5)

## 2019-12-03 ENCOUNTER — Telehealth: Payer: Self-pay | Admitting: *Deleted

## 2019-12-03 ENCOUNTER — Other Ambulatory Visit: Payer: Self-pay | Admitting: Nurse Practitioner

## 2019-12-03 DIAGNOSIS — C9000 Multiple myeloma not having achieved remission: Secondary | ICD-10-CM

## 2019-12-03 MED ORDER — OXYCODONE HCL 10 MG PO TABS: 10.0000 mg | ORAL_TABLET | Freq: Three times a day (TID) | ORAL | 0 refills | Status: DC | PRN

## 2019-12-03 NOTE — Telephone Encounter (Signed)
-----   Message from Truitt Merle, MD sent at 12/02/2019  9:05 PM EDT ----- Please let Kristin [name] or her daughter know that her recent M-protein was negative, her MM is controlled, no concerns, thanks   Truitt Merle

## 2019-12-03 NOTE — Telephone Encounter (Signed)
Called & spoke to pt's daughter, Hassan Rowan & informed of negative M-protein & that MM is under control.  She expressed appreciation.

## 2019-12-23 ENCOUNTER — Other Ambulatory Visit: Payer: Self-pay

## 2019-12-23 DIAGNOSIS — C9000 Multiple myeloma not having achieved remission: Secondary | ICD-10-CM

## 2019-12-23 MED ORDER — LENALIDOMIDE 5 MG PO CAPS: 5.0000 mg | ORAL_CAPSULE | Freq: Every day | ORAL | 0 refills | Status: DC

## 2019-12-23 NOTE — Progress Notes (Signed)
rx wrong provider. Called [facility] to cancel this rx.  Will reorder this afternoon

## 2019-12-26 ENCOUNTER — Other Ambulatory Visit: Payer: Self-pay

## 2019-12-26 ENCOUNTER — Encounter: Payer: Self-pay | Admitting: Podiatry

## 2019-12-26 ENCOUNTER — Ambulatory Visit (INDEPENDENT_AMBULATORY_CARE_PROVIDER_SITE_OTHER): Payer: Medicare Other | Admitting: Podiatry

## 2019-12-26 DIAGNOSIS — Z7901 Long term (current) use of anticoagulants: Secondary | ICD-10-CM | POA: Diagnosis not present

## 2019-12-26 DIAGNOSIS — L84 Corns and callosities: Secondary | ICD-10-CM

## 2019-12-26 DIAGNOSIS — M79674 Pain in right toe(s): Secondary | ICD-10-CM | POA: Diagnosis not present

## 2019-12-26 DIAGNOSIS — M79675 Pain in left toe(s): Secondary | ICD-10-CM | POA: Diagnosis not present

## 2019-12-26 DIAGNOSIS — B351 Tinea unguium: Secondary | ICD-10-CM | POA: Diagnosis not present

## 2019-12-27 ENCOUNTER — Other Ambulatory Visit: Payer: Self-pay | Admitting: Hematology

## 2019-12-29 ENCOUNTER — Other Ambulatory Visit: Payer: Self-pay | Admitting: Nurse Practitioner

## 2019-12-29 MED ORDER — MORPHINE SULFATE ER 15 MG PO TBCR: 15.0000 mg | EXTENDED_RELEASE_TABLET | Freq: Two times a day (BID) | ORAL | 0 refills | Status: DC

## 2020-01-01 NOTE — Progress Notes (Signed)
Subjective:   Patient ID: Kristin [name], female   DOB: 86 y.o.   MRN: [id number]   HPI 86 year old female presents the office with her daughter for concerns of her toenails becoming thick, discolored mostly her right big toenail which causes occasional discomfort.  Denies any redness or drainage.  She does get some dry skin to her heels with the right side worse than left and the skin starts to crack.  Denies any open sores or any drainage.  Review of Systems  All other systems reviewed and are negative.  Past Medical History:  Diagnosis Date  . Bone cancer [facility])    "being tx'd by Dr. Burr Medico @ [facility]" (10/09/2017)  . Chronic deep vein thrombosis (DVT) of both femoral veins ([place]): 12/05/17: Chronic recanalized CFV DVT bilateral 12/05/2017  . Critical lower limb ischemia 10/04/2017   Critical limb ischemia  . Depression    Risperdal "twice/day" (09/23/2016)  . Glaucoma   . Hiatal hernia   . Intractable back pain    /notes 09/23/2016  . Obesity   . Osteoarthritis    "all over" (10/09/2017)  . Osteoporosis    vertebral fracture in 2012  . Peripheral vascular disease ([place])   . Upper GI bleed 04/2004   secondary to Mallory-Weiss tear/notes 12/28/2010  . Vertigo     Past Surgical History:  Procedure Laterality Date  . ABDOMINAL HYSTERECTOMY  1974   partial  . APPENDECTOMY     as a child/notes 12/28/2010  . BACK SURGERY    . BREAST BIOPSY Left 1999   Archie Endo 12/28/2010  . CATARACT EXTRACTION W/ INTRAOCULAR LENS  IMPLANT, BILATERAL Bilateral 2000s  . ESOPHAGOGASTRODUODENOSCOPY  04/2004   Archie Endo 12/28/2010  . FIXATION KYPHOPLASTY THORACIC SPINE  05/2011   T10/notes 05/25/2011  . FRACTURE SURGERY    . HIP ARTHROPLASTY Right 01/19/2017   Procedure: RIGHT HIP HEMIARTHROPLASTY;  Surgeon: Renette Butters, MD;  Location: [facility];  Service: Orthopedics;  Laterality: Right;  . LAPAROSCOPIC CHOLECYSTECTOMY  10/2007   Archie Endo 12/14/2010  . LOWER EXTREMITY ANGIOGRAPHY N/A 10/09/2017   Procedure:  LOWER EXTREMITY ANGIOGRAPHY;  Surgeon: Lorretta Harp, MD;  Location: [place] [facility];  Service: Cardiovascular;  Laterality: N/A;  bilateral  . LOWER EXTREMITY ANGIOGRAPHY  11/21/2017   Procedure: Lower Extremity Angiography;  Surgeon: Adrian Prows, MD;  Location: [facility] [facility];  Service: Cardiovascular;;  . PERIPHERAL VASCULAR BALLOON ANGIOPLASTY  10/09/2017   Attempted PTA Archie Endo 10/09/2017  . PERIPHERAL VASCULAR BALLOON ANGIOPLASTY  10/09/2017   Procedure: PERIPHERAL VASCULAR BALLOON ANGIOPLASTY;  Surgeon: Lorretta Harp, MD;  Location: [facility];  Service: Cardiovascular;;  Attempted PTA      Current Outpatient Medications:  .  amLODipine (NORVASC) 2.5 MG tablet, Take 2.5 mg by mouth daily., Disp: , Rfl:  .  ASPIRIN ADULT LOW STRENGTH 81 MG EC tablet, Take 81 mg by mouth daily., Disp: , Rfl:  .  atorvastatin (LIPITOR) 10 MG tablet, Take 10 mg by mouth daily., Disp: , Rfl:  .  Calcium Carbonate-Vit D-Min (CALCIUM-VITAMIN D-MINERALS) 600-800 MG-UNIT CHEW, Chew 1 tablet by mouth 2 (two) times daily. , Disp: , Rfl:  .  chlorhexidine (PERIDEX) 0.12 % solution, 2 (two) times a day., Disp: , Rfl:  .  clindamycin (CLEOCIN) 150 MG capsule, TAKE 4 CAPSULES BY MOUTH 1 HOUR BEFORE SURGERY, Disp: , Rfl:  .  doxycycline (VIBRAMYCIN) 100 MG capsule, Take 100 mg by mouth 2 (two) times daily., Disp: , Rfl:  .  feeding supplement, ENSURE ENLIVE, (ENSURE ENLIVE) LIQD, Take 237 mLs by mouth 2 (two) times daily between meals. (Patient taking differently: Take 237 mLs by mouth daily as needed (nutrition). ), Disp: 237 mL, Rfl: 12 .  furosemide (LASIX) 20 MG tablet, Take 20 mg by mouth daily., Disp: , Rfl:  .  irbesartan (AVAPRO) 75 MG tablet, Take 75 mg by mouth daily after supper., Disp: , Rfl:  .  lenalidomide (REVLIMID) 5 MG capsule, Take 1 capsule (5 mg total) by mouth daily. Take daily x 3 weeks then 1 week rest., Disp: 21 capsule, Rfl: 0 .  magnesium oxide (MAG-OX) 400 (241.3 Mg) MG  tablet, Take 1 tablet (400 mg total) by mouth daily., Disp: 7 tablet, Rfl: 0 .  magnesium oxide (MAG-OX) 400 MG tablet, Take 1 tablet by mouth daily., Disp: , Rfl:  .  megestrol (MEGACE) 40 MG tablet, Take 40 mg by mouth daily., Disp: , Rfl:  .  mirtazapine (REMERON) 7.5 MG tablet, Take 7.5 mg by mouth at bedtime. , Disp: , Rfl:  .  morphine ([name]) 15 MG 12 hr tablet, Take 1 tablet (15 mg total) by mouth every 12 (twelve) hours., Disp: 60 tablet, Rfl: 0 .  ondansetron (ZOFRAN ODT) 4 MG disintegrating tablet, Take 1-2 tablets (4-8 mg total) by mouth every 8 (eight) hours as needed for nausea or vomiting. (Patient taking differently: Take 4-8 mg by mouth every 8 (eight) hours as needed for nausea or vomiting. Nausea and vomiting), Disp: 30 tablet, Rfl: 2 .  Oxycodone HCl 10 MG TABS, Take 1 tablet (10 mg total) by mouth every 8 (eight) hours as needed. FOR MODERATE OR SEVERE PAIN, Disp: 60 tablet, Rfl: 0 .  risperiDONE (RISPERDAL) 0.5 MG tablet, Take 0.5 mg by mouth at bedtime. , Disp: , Rfl:  .  sennosides-docusate sodium (SENOKOT-S) 8.6-50 MG tablet, Take 1 tablet by mouth 2 (two) times daily. , Disp: , Rfl:  .  XARELTO 2.5 MG TABS tablet, TAKE ONE TABLET BY MOUTH EVERY MORNING and TAKE ONE TABLET BY MOUTH AT NOON, Disp: 180 tablet, Rfl: 1 No current facility-administered medications for this visit.  Facility-Administered Medications Ordered in Other Visits:  .  denosumab (XGEVA) injection 120 mg, 120 mg, Subcutaneous, Once, Truitt Merle, MD  Allergies  Allergen Reactions  . Penicillins Rash and Other (See Comments)    Has patient had a PCN reaction causing immediate rash, facial/tongue/throat swelling, SOB or lightheadedness with hypotension: yes Has patient had a PCN reaction causing severe rash involving mucus membranes or skin necrosis: no Has patient had a PCN reaction that required hospitalization no Has patient had a PCN reaction occurring within the last 10 years:no If all of the above  answers are "NO", then may proceed with Cephalosporin use.    . Tramadol     headache  . Alendronate Nausea Only and Other (See Comments)    Hip pain  . Sulfonamide Derivatives Itching and Rash          Objective:  Physical Exam  General: AAO x3, NAD  Dermatological: Nails are hypertrophic, dystrophic, brittle, with brown discoloration, elongated 10 with the right hallux toenail the worst. No surrounding redness or drainage. Tenderness nails 1-5 bilaterally.  Dry skin to posterior heels right side worse than left.  No open lesions or pre-ulcerative lesions are identified today.  Vascular: Dorsalis Pedis artery and Posterior Tibial artery pedal pulses are 1/4 bilateral with immedate capillary fill time.  Chronic bilateral foot, ankle edema noted without  any erythema.  There is no pain with calf compression, swelling, warmth, erythema.   Neruologic: Grossly intact via light touch bilateral.   Musculoskeletal: No other areas of discomfort.     Assessment:   Symptomatic onychomycosis, hyperkeratotic lesion     Plan:  -Treatment options discussed including all alternatives, risks, and complications -Etiology of symptoms were discussed -Debrided the nails x10 without any complications or bleeding -Debrided the hyperkeratotic tissue x1 without any complications of the right heel.  Moisturizer daily.  Offloading. -Daily foot inspection  Return in about 3 months (around 03/27/2020).  Trula Slade DPM

## 2020-02-03 ENCOUNTER — Other Ambulatory Visit: Payer: Self-pay | Admitting: Nurse Practitioner

## 2020-02-04 ENCOUNTER — Other Ambulatory Visit: Payer: Self-pay | Admitting: Oncology

## 2020-02-04 ENCOUNTER — Other Ambulatory Visit: Payer: Self-pay | Admitting: Nurse Practitioner

## 2020-02-04 MED ORDER — MORPHINE SULFATE ER 15 MG PO TBCR: 15.0000 mg | EXTENDED_RELEASE_TABLET | Freq: Two times a day (BID) | ORAL | 0 refills | Status: DC

## 2020-02-04 NOTE — Telephone Encounter (Signed)
Lattie Haw, Will you do this refill for lacie? [place]

## 2020-02-05 NOTE — Telephone Encounter (Signed)
Already refilled

## 2020-02-10 ENCOUNTER — Other Ambulatory Visit: Payer: Self-pay

## 2020-02-10 ENCOUNTER — Ambulatory Visit: Payer: Medicare Other | Admitting: Cardiology

## 2020-02-10 ENCOUNTER — Encounter: Payer: Self-pay | Admitting: Cardiology

## 2020-02-10 VITALS — BP 132/72 | HR 83 | Resp 16 | Ht 60.0 in | Wt 168.0 lb

## 2020-02-10 DIAGNOSIS — N1832 Chronic kidney disease, stage 3b: Secondary | ICD-10-CM

## 2020-02-10 DIAGNOSIS — I739 Peripheral vascular disease, unspecified: Secondary | ICD-10-CM

## 2020-02-10 DIAGNOSIS — I10 Essential (primary) hypertension: Secondary | ICD-10-CM

## 2020-02-10 NOTE — Progress Notes (Signed)
Primary Physician/Referring:  [name], MD  Patient ID: Kristin [name], female    DOB: [birth year], 86 y.o.   MRN: [id number]  Chief Complaint  Patient presents with   PAD   Follow-up    6 month   HPI: Kristin [name]  is a 86 y.o. female  with ypertension, hyperlipidemia, chronic shortness breath and palpitations that is stable, multiple myeloma in remession, PAD with bilateral nonhealing malleoi ulcer managed by wound care and she underwent successful revascularization of right peroneal and posterior tibial artery by Dr. Brunetta Jeans on 12/26/2017. He wounds have completely healed and she has no further pain and not used any narcotics for claudication. She also has chronic recanalized bilateral CFV DVT noted in 2019. She is on low dose Xarelto 2.5 mg BID for PAD.   She had an episode of bloody stool on 12/70/2020 and was seen in the emergency room with abdominal discomfort and discharged home.  She has not had any recurrence.  No further abdominal discomfort.  With regard to lower extremity, denies any symptoms of claudication or cramping, no skin breakdown.    Past Medical History:  Diagnosis Date   Bone cancer [facility])    "being tx'd by Dr. Burr Medico @ [facility]" (10/09/2017)   Chronic deep vein thrombosis (DVT) of both femoral veins ([place]): 12/05/17: Chronic recanalized CFV DVT bilateral 12/05/2017   Critical lower limb ischemia 10/04/2017   Critical limb ischemia   Depression    Risperdal "twice/day" (09/23/2016)   Glaucoma    Hiatal hernia    Intractable back pain    /notes 09/23/2016   Obesity    Osteoarthritis    "all over" (10/09/2017)   Osteoporosis    vertebral fracture in 2012   Peripheral vascular disease ([place])    Upper GI bleed 04/2004   secondary to Mallory-Weiss tear/notes 12/28/2010   Vertigo     Past Surgical History:  Procedure Laterality Date   ABDOMINAL HYSTERECTOMY  1974   partial   APPENDECTOMY     as a child/notes 12/28/2010   BACK  SURGERY     BREAST BIOPSY Left 1999   /notes 12/28/2010   CATARACT EXTRACTION W/ INTRAOCULAR LENS  IMPLANT, BILATERAL Bilateral 2000s   ESOPHAGOGASTRODUODENOSCOPY  04/2004   /notes 12/28/2010   FIXATION KYPHOPLASTY THORACIC SPINE  05/2011   T10/notes 05/25/2011   FRACTURE SURGERY     HIP ARTHROPLASTY Right 01/19/2017   Procedure: RIGHT HIP HEMIARTHROPLASTY;  Surgeon: Renette Butters, MD;  Location: [facility];  Service: Orthopedics;  Laterality: Right;   LAPAROSCOPIC CHOLECYSTECTOMY  10/2007   Archie Endo 12/14/2010   LOWER EXTREMITY ANGIOGRAPHY N/A 10/09/2017   Procedure: LOWER EXTREMITY ANGIOGRAPHY;  Surgeon: Lorretta Harp, MD;  Location: [place] CV [facility];  Service: Cardiovascular;  Laterality: N/A;  bilateral   LOWER EXTREMITY ANGIOGRAPHY  11/21/2017   Procedure: Lower Extremity Angiography;  Surgeon: Adrian Prows, MD;  Location: [place] [facility];  Service: Cardiovascular;;   PERIPHERAL VASCULAR BALLOON ANGIOPLASTY  10/09/2017   Attempted PTA /notes 10/09/2017   PERIPHERAL VASCULAR BALLOON ANGIOPLASTY  10/09/2017   Procedure: PERIPHERAL VASCULAR BALLOON ANGIOPLASTY;  Surgeon: Lorretta Harp, MD;  Location: [place] [facility];  Service: Cardiovascular;;  Attempted PTA      Social History   Tobacco Use   Smoking status: Never Smoker   Smokeless tobacco: Never Used  Substance Use Topics   Alcohol use: Not Currently   Marital Status: Widowed   Current Outpatient Medications on File  Prior to Visit  Medication Sig Dispense Refill   amLODipine (NORVASC) 2.5 MG tablet Take 2.5 mg by mouth daily.     ASPIRIN ADULT LOW STRENGTH 81 MG EC tablet Take 81 mg by mouth daily.     atorvastatin (LIPITOR) 10 MG tablet Take 10 mg by mouth daily.     Calcium Carbonate-Vit D-Min (CALCIUM-VITAMIN D-MINERALS) 600-800 MG-UNIT CHEW Chew 1 tablet by mouth 2 (two) times daily.      chlorhexidine (PERIDEX) 0.12 % solution 2 (two) times a day.     feeding supplement, ENSURE ENLIVE, (ENSURE  ENLIVE) LIQD Take 237 mLs by mouth 2 (two) times daily between meals. (Patient taking differently: Take 237 mLs by mouth daily as needed (nutrition). ) 237 mL 12   furosemide (LASIX) 20 MG tablet Take 20 mg by mouth daily.     irbesartan (AVAPRO) 75 MG tablet Take 75 mg by mouth daily after supper.     lenalidomide (REVLIMID) 5 MG capsule Take 1 capsule (5 mg total) by mouth daily. Take daily x 3 weeks then 1 week rest. 21 capsule 0   magnesium oxide (MAG-OX) 400 (241.3 Mg) MG tablet Take 1 tablet (400 mg total) by mouth daily. 7 tablet 0   magnesium oxide (MAG-OX) 400 MG tablet Take 1 tablet by mouth daily.     megestrol (MEGACE) 40 MG tablet Take 40 mg by mouth daily.     mirtazapine (REMERON) 7.5 MG tablet Take 7.5 mg by mouth at bedtime.      morphine ([name]) 15 MG 12 hr tablet Take 1 tablet (15 mg total) by mouth every 12 (twelve) hours. 60 tablet 0   Oxycodone HCl 10 MG TABS Take 1 tablet (10 mg total) by mouth every 8 (eight) hours as needed. FOR MODERATE OR SEVERE PAIN 60 tablet 0   risperiDONE (RISPERDAL) 0.5 MG tablet Take 0.5 mg by mouth at bedtime.      sennosides-docusate sodium (SENOKOT-S) 8.6-50 MG tablet Take 1 tablet by mouth 2 (two) times daily.      XARELTO 2.5 MG TABS tablet TAKE ONE TABLET BY MOUTH EVERY MORNING and TAKE ONE TABLET BY MOUTH AT NOON 180 tablet 1   Current Facility-Administered Medications on File Prior to Visit  Medication Dose Route Frequency Provider Last Rate Last Admin   denosumab (XGEVA) injection 120 mg  120 mg Subcutaneous Once Truitt Merle, MD       Review of Systems  Cardiovascular: Positive for leg swelling (chronic). Negative for dyspnea on exertion and syncope.  Skin: Positive for color change (chronic venostasis pignemtation. No skin breakdown noted).  Musculoskeletal: Positive for joint pain (bilateral knee and hip).  Gastrointestinal: Negative for heartburn and melena.  All other systems reviewed and are negative.  Objective    Blood pressure 132/72, pulse 83, resp. rate 16, height 5' (1.524 m), weight 168 lb (76.2 kg), SpO2 97 %. Body mass index is 32.81 kg/m.    Physical Exam Constitutional:      Comments: Moderately built and obese in no acute distress.  Neck:     Thyroid: No thyromegaly.  Cardiovascular:     Rate and Rhythm: Normal rate and regular rhythm.     Pulses:          Carotid pulses are 2+ on the right side and 2+ on the left side.      Femoral pulses are 2+ on the right side and 2+ on the left side.      Popliteal pulses are  2+ on the right side and 2+ on the left side.       Dorsalis pedis pulses are 1+ on the right side and 1+ on the left side.       Posterior tibial pulses are 0 on the right side and 0 on the left side.     Heart sounds: Normal heart sounds. No murmur heard.  No gallop.      Comments: Varicose veins noted bilateral legs. Chronic dermatitis changes noted. Capillary refill normal No leg edema, no JVD.  Pulmonary:     Effort: Pulmonary effort is normal.     Breath sounds: Normal breath sounds.  Abdominal:     General: Bowel sounds are normal.     Palpations: Abdomen is soft.  Musculoskeletal:     Cervical back: Neck supple.    Radiology: No results found.  Laboratory examination:   CMP Latest Ref Rng & Units 11/28/2019 08/29/2019 07/18/2019  Glucose 70 - 99 mg/dL 88 154(H) 111(H)  BUN 8 - 23 mg/dL 25(H) 20 23  Creatinine 0.44 - 1.00 mg/dL 1.62(H) 1.65(H) 1.47(H)  Sodium 135 - 145 mmol/L 143 142 136  Potassium 3.5 - 5.1 mmol/L 3.7 3.8 3.3(L)  Chloride 98 - 111 mmol/L 107 108 99  CO2 22 - 32 mmol/L 27 25 24   Calcium 8.9 - 10.3 mg/dL 9.9 9.1 9.4  Total Protein 6.5 - 8.1 g/dL 7.7 6.4(L) 7.2  Total Bilirubin 0.3 - 1.2 mg/dL 0.7 0.3 1.0  Alkaline Phos 38 - 126 U/L 66 67 87  AST 15 - 41 U/L 25 21 34  ALT 0 - 44 U/L 19 16 30    CBC Latest Ref Rng & Units 11/28/2019 08/29/2019 07/18/2019  WBC 4.0 - 10.5 K/uL 6.8 4.7 7.3  Hemoglobin 12.0 - 15.0 g/dL 13.4 11.6(L) 12.7   Hematocrit 36 - 46 % 40.2 34.0(L) 38.0  Platelets 150 - 400 K/uL 221 181 212    Lipid Panel No results for input(s): CHOL, TRIG, LDLCALC, VLDL, HDL, CHOLHDL, LDLDIRECT in the last 8760 hours.  HEMOGLOBIN A1C Lab Results  Component Value Date   HGBA1C 6.3 (H) 09/25/2016   MPG 134 09/25/2016   TSH No results for input(s): TSH in the last 8760 hours.   External Labs:  Hemoglobin 12.700 01/28/2020 Platelets 221.000 11/28/2019  Creatinine, Serum 1.760 01/28/2020 Potassium 4.100 01/28/2020 ALT (SGPT) 19.000 11/28/2019 TSH 1.773 10/22/2016  Cardiac Studies:   Lexiscan stress 03/28/12: Mild gut uptake artifact. No ischemia. Low risk stress test.    Echocardiogram  [08/05/2014]: Left ventricle cavity is normal in size. Moderate concentric hypertrophy of the left ventricle. Borderline decrease in global wall motion. Doppler evidence of grade I (impaired) diastolic dysfunction. Diastolic dysfunction do not suggest elevated LA/LV endiastolic pressure. Left ventricle regional wall motion findings: No wall motion abnormalities. Visual EF is 50-55%. Calculated EF 67%. Mild calcification of the aortic valve annulus. Mild aortic valve leaflet calcification. No aortic valve regurgitation noted. Mild mitral regurgitation. Mild calcification of the mitral valve annulus. Mild to moderate tricuspid regurgitation. Mild pulmonary hypertension. IVC is dilated with blunted respiratory response. Mild pulmonic regurgitation. Overall no significant change since 03/19/2012.  Peripheral arteriogram:   10/09/2017: Two-vessel runoff left lower extremity with occluded posterior tibial, right lower extremity 0 vessel runoff. Unsuccessful attempt at PTCA peroneal artery.  Peripheral arteriogram 12/26/2017:  PTA of the right PT 80% to <20% with a 4x40 balloon. Successful PTA of the right Peroneal 100% to <20% with a 2.5x220 balloon. Access of the right DP  by Dr. Tammi Klippel at [place], [place], [place] after unsuccessful attempt by me  on 11/21/2017.  Lower extremity arterial duplex 10/02/2017: Right ABI 0.77, left ABI 0.99. Technically difficult study. Disease below knee bilateral.  ABI 05/24/2018: This exam reveals mildly decreased perfusion of the right lower extremity, noted at the dorsalis pedis artery level (ABI 0.85) and mildly decreased perfusion of the left lower extremity, noted at the dorsalis pedis artery level (ABI 0.88).  Compared to the study done on 01/09/2018, no significant change, left ABI was previously reported to be normal with triphasic waveform.  Lower extremity venous duplex 12/05/2017: There is no evidence of venous insufficiency in the greater saphenous veins. Lesser saphenous veins were not evaluated due to lower extremity dressings in place. There is chronic partially occlusive DVT throughout the bilateral femoral veins as described. There is recanalization of flow throughout the thrombus.  EKG:  EKG 02/10/2020: Normal sinus rhythm with rate of 85 bpm, left axis deviation, left ankle fascicular block.  Incomplete right bundle branch block.  Poor R progression, pulmonary disease pattern.  No significant change from 10/27/2017.  Assessment     ICD-10-CM   1. Peripheral vascular disease (HCC)  I73.9   2. Stage 3b chronic kidney disease  N18.32   3. Primary hypertension  I10 EKG 12-Lead    EKG 10/27/2017: Normal sinus rhythm at rate of 84 bpm, left atrial abnormality, left axis deviation, left anterior fascicular block. Poor progression, cannot exclude anteroseptal infarct old. LVH.  Recommendations:   EVITA MERIDA  is a 86 y.o. female  with ypertension, hyperlipidemia, chronic shortness breath and palpitations that is stable, multiple myeloma in remession, PAD with bilateral nonhealing malleoi ulcer managed by wound care and she underwent successful revascularization of right peroneal and posterior tibial artery by Dr. Brunetta Jeans on 12/26/2017. He wounds have completely healed and she has  no further pain and not used any narcotics for claudication. She also has chronic recanalized bilateral CFV DVT noted in 2019. She is on low dose Xarelto 2.5 mg BID for PAD.   She is presently doing well, her foot is warm, capillary refill is completely normal, I'm extremely pleased with the progress.  Encouraged her to continue to remain physically active.  There is no bleeding diathesis on low-dose Xarelto  BP is well controlled.  Outside labs reviewed.  Renal function and Hb stable. She is on a statin.   I will see her back in 6 months.  No test were ordered.  She is aware to contact me immediately if she were to develop any bluish discoloration of her toes or any ulceration.   Adrian Prows, MD, [facility] 02/10/2020, 3:46 PM Office: [phone]

## 2020-02-25 ENCOUNTER — Other Ambulatory Visit: Payer: Self-pay | Admitting: Hematology

## 2020-02-25 DIAGNOSIS — C9000 Multiple myeloma not having achieved remission: Secondary | ICD-10-CM

## 2020-02-26 ENCOUNTER — Other Ambulatory Visit: Payer: Self-pay | Admitting: Hematology

## 2020-02-26 MED ORDER — MORPHINE SULFATE ER 15 MG PO TBCR: 15.0000 mg | EXTENDED_RELEASE_TABLET | Freq: Two times a day (BID) | ORAL | 0 refills | Status: DC

## 2020-02-26 MED ORDER — LENALIDOMIDE 5 MG PO CAPS: 5.0000 mg | ORAL_CAPSULE | Freq: Every day | ORAL | 0 refills | Status: DC

## 2020-02-26 NOTE — Telephone Encounter (Signed)
disregard

## 2020-03-23 ENCOUNTER — Other Ambulatory Visit: Payer: Self-pay

## 2020-03-23 DIAGNOSIS — C9000 Multiple myeloma not having achieved remission: Secondary | ICD-10-CM

## 2020-03-23 MED ORDER — LENALIDOMIDE 5 MG PO CAPS: 5.0000 mg | ORAL_CAPSULE | Freq: Every day | ORAL | 0 refills | Status: DC

## 2020-03-27 ENCOUNTER — Other Ambulatory Visit: Payer: Self-pay

## 2020-03-27 ENCOUNTER — Ambulatory Visit (INDEPENDENT_AMBULATORY_CARE_PROVIDER_SITE_OTHER): Payer: Medicare Other | Admitting: Podiatry

## 2020-03-27 DIAGNOSIS — B351 Tinea unguium: Secondary | ICD-10-CM

## 2020-03-27 DIAGNOSIS — L84 Corns and callosities: Secondary | ICD-10-CM

## 2020-03-27 DIAGNOSIS — M79674 Pain in right toe(s): Secondary | ICD-10-CM | POA: Diagnosis not present

## 2020-03-27 DIAGNOSIS — I739 Peripheral vascular disease, unspecified: Secondary | ICD-10-CM | POA: Diagnosis not present

## 2020-03-27 DIAGNOSIS — M79675 Pain in left toe(s): Secondary | ICD-10-CM

## 2020-03-28 ENCOUNTER — Encounter: Payer: Self-pay | Admitting: Podiatry

## 2020-03-28 NOTE — Progress Notes (Signed)
Subjective:  Patient ID: Kristin [name], female    DOB: 1931/05/24,  MRN: [id number]  [name] presents to clinic today for for at risk foot care. Patient has h/o PAD and painful thick toenails that are difficult to trim. Pain interferes with ambulation. Aggravating factors include wearing enclosed shoe gear. Pain is relieved with periodic professional debridement..  86 y.o. female presents with the above complaint.    Review of Systems: Negative except as noted in the HPI. Past Medical History:  Diagnosis Date  . Bone cancer [facility])    "being tx'd by Dr. Burr Medico @ [facility]" (10/09/2017)  . Chronic deep vein thrombosis (DVT) of both femoral veins ([place]): 12/05/17: Chronic recanalized CFV DVT bilateral 12/05/2017  . Critical lower limb ischemia 10/04/2017   Critical limb ischemia  . Depression    Risperdal "twice/day" (09/23/2016)  . Glaucoma   . Hiatal hernia   . Intractable back pain    /notes 09/23/2016  . Obesity   . Osteoarthritis    "all over" (10/09/2017)  . Osteoporosis    vertebral fracture in 2012  . Peripheral vascular disease ([place])   . Upper GI bleed 04/2004   secondary to Mallory-Weiss tear/notes 12/28/2010  . Vertigo    Past Surgical History:  Procedure Laterality Date  . ABDOMINAL HYSTERECTOMY  1974   partial  . APPENDECTOMY     as a child/notes 12/28/2010  . BACK SURGERY    . BREAST BIOPSY Left 1999   Archie Endo 12/28/2010  . CATARACT EXTRACTION W/ INTRAOCULAR LENS  IMPLANT, BILATERAL Bilateral 2000s  . ESOPHAGOGASTRODUODENOSCOPY  04/2004   Archie Endo 12/28/2010  . FIXATION KYPHOPLASTY THORACIC SPINE  05/2011   T10/notes 05/25/2011  . FRACTURE SURGERY    . HIP ARTHROPLASTY Right 01/19/2017   Procedure: RIGHT HIP HEMIARTHROPLASTY;  Surgeon: Renette Butters, MD;  Location: [facility];  Service: Orthopedics;  Laterality: Right;  . LAPAROSCOPIC CHOLECYSTECTOMY  10/2007   Archie Endo 12/14/2010  . LOWER EXTREMITY ANGIOGRAPHY N/A 10/09/2017   Procedure: LOWER EXTREMITY ANGIOGRAPHY;   Surgeon: Lorretta Harp, MD;  Location: [place] [facility];  Service: Cardiovascular;  Laterality: N/A;  bilateral  . LOWER EXTREMITY ANGIOGRAPHY  11/21/2017   Procedure: Lower Extremity Angiography;  Surgeon: Adrian Prows, MD;  Location: [place] [facility];  Service: Cardiovascular;;  . PERIPHERAL VASCULAR BALLOON ANGIOPLASTY  10/09/2017   Attempted PTA Archie Endo 10/09/2017  . PERIPHERAL VASCULAR BALLOON ANGIOPLASTY  10/09/2017   Procedure: PERIPHERAL VASCULAR BALLOON ANGIOPLASTY;  Surgeon: Lorretta Harp, MD;  Location: [place] [facility];  Service: Cardiovascular;;  Attempted PTA     Current Outpatient Medications:  .  amLODipine (NORVASC) 2.5 MG tablet, Take 2.5 mg by mouth daily., Disp: , Rfl:  .  ASPIRIN ADULT LOW STRENGTH 81 MG EC tablet, Take 81 mg by mouth daily., Disp: , Rfl:  .  atorvastatin (LIPITOR) 10 MG tablet, Take 10 mg by mouth daily., Disp: , Rfl:  .  Calcium Carbonate-Vit D-Min (CALCIUM-VITAMIN D-MINERALS) 600-800 MG-UNIT CHEW, Chew 1 tablet by mouth 2 (two) times daily. , Disp: , Rfl:  .  chlorhexidine (PERIDEX) 0.12 % solution, 2 (two) times a day., Disp: , Rfl:  .  feeding supplement, ENSURE ENLIVE, (ENSURE ENLIVE) LIQD, Take 237 mLs by mouth 2 (two) times daily between meals. (Patient taking differently: Take 237 mLs by mouth daily as needed (nutrition). ), Disp: 237 mL, Rfl: 12 .  furosemide (LASIX) 20 MG tablet, Take 20 mg by mouth daily., Disp: , Rfl:  .  irbesartan (AVAPRO) 75 MG tablet, Take 75 mg by mouth daily after supper., Disp: , Rfl:  .  lenalidomide (REVLIMID) 5 MG capsule, Take 1 capsule (5 mg total) by mouth daily. Take daily x 3 weeks then 1 week rest., Disp: 21 capsule, Rfl: 0 .  magnesium oxide (MAG-OX) 400 (241.3 Mg) MG tablet, Take 1 tablet (400 mg total) by mouth daily., Disp: 7 tablet, Rfl: 0 .  magnesium oxide (MAG-OX) 400 MG tablet, Take 1 tablet by mouth daily., Disp: , Rfl:  .  megestrol (MEGACE) 40 MG tablet, Take 40 mg by mouth daily., Disp: , Rfl:   .  mirtazapine (REMERON) 7.5 MG tablet, Take 7.5 mg by mouth at bedtime. , Disp: , Rfl:  .  morphine ([name]) 15 MG 12 hr tablet, Take 1 tablet (15 mg total) by mouth every 12 (twelve) hours., Disp: 60 tablet, Rfl: 0 .  Oxycodone HCl 10 MG TABS, Take 1 tablet (10 mg total) by mouth every 8 (eight) hours as needed. FOR MODERATE OR SEVERE PAIN, Disp: 60 tablet, Rfl: 0 .  risperiDONE (RISPERDAL) 0.5 MG tablet, Take 0.5 mg by mouth at bedtime. , Disp: , Rfl:  .  sennosides-docusate sodium (SENOKOT-S) 8.6-50 MG tablet, Take 1 tablet by mouth 2 (two) times daily. , Disp: , Rfl:  .  XARELTO 2.5 MG TABS tablet, TAKE ONE TABLET BY MOUTH EVERY MORNING and TAKE ONE TABLET BY MOUTH AT NOON, Disp: 180 tablet, Rfl: 1 No current facility-administered medications for this visit.  Facility-Administered Medications Ordered in Other Visits:  .  denosumab (XGEVA) injection 120 mg, 120 mg, Subcutaneous, Once, Truitt Merle, MD Allergies  Allergen Reactions  . Penicillins Rash and Other (See Comments)    Has patient had a PCN reaction causing immediate rash, facial/tongue/throat swelling, SOB or lightheadedness with hypotension: yes Has patient had a PCN reaction causing severe rash involving mucus membranes or skin necrosis: no Has patient had a PCN reaction that required hospitalization no Has patient had a PCN reaction occurring within the last 10 years:no If all of the above answers are "NO", then may proceed with Cephalosporin use.    . Tramadol     headache  . Alendronate Nausea Only and Other (See Comments)    Hip pain  . Sulfonamide Derivatives Itching and Rash   Social History   Occupational History  . Not on file  Tobacco Use  . Smoking status: Never Smoker  . Smokeless tobacco: Never Used  Vaping Use  . Vaping Use: Never used  Substance and Sexual Activity  . Alcohol use: Not Currently  . Drug use: No  . Sexual activity: Never    Objective:   Constitutional Kristin [name] is a  pleasant 86 y.o. African American female, in NAD.Marland Kitchen AAO x 3.   Vascular Capillary fill time to digits <3 seconds b/l lower extremities. Faintly palpable DP pulse(s) b/l lower extremities. Nonpalpable PT pulse(s) b/l lower extremities. Pedal hair absent. Lower extremity skin temperature gradient within normal limits. No pain with calf compression b/l.  No cyanosis or clubbing noted.  Neurologic Normal speech. Oriented to person, place, and time. Epicritic sensation to light touch grossly present bilaterally. Protective sensation intact 5/5 intact bilaterally with 10g monofilament b/l.  Dermatologic Pedal skin with normal turgor, texture and tone bilaterally. No open wounds bilaterally. No interdigital macerations bilaterally. Toenails 1-5 b/l elongated, discolored, dystrophic, thickened, crumbly with subungual debris and tenderness to dorsal palpation. Hyperkeratotic lesion(s) right foot.  No erythema, no edema, no  drainage, no flocculence.  Orthopedic: Normal muscle strength 5/5 to all lower extremity muscle groups bilaterally. No pain crepitus or joint limitation noted with ROM b/l. No gross bony deformities bilaterally.   Radiographs: None Assessment:   1. Pain due to onychomycosis of toenails of both feet   2. Pre-ulcerative calluses   3. PAD (peripheral artery disease) ([place])    Plan:  Patient was evaluated and treated and all questions answered.  Onychomycosis with pain -Nails palliatively debridement as below -Educated on self-care  Procedure: Nail Debridement Rationale: Pain Type of Debridement: manual, sharp debridement. Instrumentation: Nail nipper, rotary burr. Number of Nails: 10 -Examined patient. -Toenails 1-5 b/l were debrided in length and girth with sterile nail nippers and dremel without iatrogenic bleeding. Calluses pared right heel utilizing sterile scalpel blade and sterile bur without incident.  -Patient to report any pedal injuries to medical professional  immediately. -Counseled daughter on the use of heel protectors to prevent decubitus ulcerations. Daughter states she has a foam wedge to elevate her feet on. Also advised her to watch pressure on her Mother's bottom. -Patient to continue soft, supportive shoe gear daily. -Patient/POA to call should there be question/concern in the interim.  Return in about 3 months (around 06/27/2020) for nail and callus trim.  Marzetta Board, DPM

## 2020-04-03 ENCOUNTER — Other Ambulatory Visit: Payer: Self-pay | Admitting: Hematology

## 2020-04-03 MED ORDER — MORPHINE SULFATE ER 15 MG PO TBCR: 15.0000 mg | EXTENDED_RELEASE_TABLET | Freq: Two times a day (BID) | ORAL | 0 refills | Status: DC

## 2020-04-21 ENCOUNTER — Ambulatory Visit: Payer: Self-pay

## 2020-04-28 ENCOUNTER — Telehealth: Payer: Self-pay

## 2020-04-28 ENCOUNTER — Telehealth: Payer: Self-pay | Admitting: Hematology

## 2020-04-28 ENCOUNTER — Other Ambulatory Visit: Payer: Self-pay

## 2020-04-28 DIAGNOSIS — C9000 Multiple myeloma not having achieved remission: Secondary | ICD-10-CM

## 2020-04-28 MED ORDER — LENALIDOMIDE 5 MG PO CAPS: 5.0000 mg | ORAL_CAPSULE | Freq: Every day | ORAL | 0 refills | Status: DC

## 2020-04-28 NOTE — Telephone Encounter (Signed)
I left a message for Ms Goth's daughter Hassan Rowan.  We received a request to refill revlimid.  Ms Fogarty has not had an ov since 11/2019.  I relayed this message to Hassan Rowan and requested that she call.  Scheduling message sent.

## 2020-04-28 NOTE — Telephone Encounter (Signed)
Scheduled appt per 9/14 sch msg - pt daughter is aware

## 2020-05-05 ENCOUNTER — Telehealth: Payer: Self-pay

## 2020-05-05 NOTE — Telephone Encounter (Signed)
[facility] called requesting [name] refill for Ms Desjardin. Forwarded to Dr. Burr Medico

## 2020-05-06 ENCOUNTER — Other Ambulatory Visit: Payer: Self-pay | Admitting: Hematology

## 2020-05-06 MED ORDER — MORPHINE SULFATE ER 15 MG PO TBCR: 15.0000 mg | EXTENDED_RELEASE_TABLET | Freq: Two times a day (BID) | ORAL | 0 refills | Status: DC

## 2020-05-10 NOTE — Progress Notes (Signed)
[facility] OFFICE PROGRESS NOTE  [name], MD [place] [address]  DIAGNOSIS: F/u of Multiple Myeloma   Oncology History  Multiple myeloma ([place])  10/18/2016 - 10/26/2016 Hospital Admission   Pt was admitted for symptomatic hypercalcemia, AKI, bone marrow biopsy done during hospital stay, and she received Aredia    10/19/2016 Tumor Marker   SPEP showed M-protein 0.4g/dl, serum lambda light chain 5362, ratio 0.00   10/25/2016 Bone Marrow Biopsy    Overall, the marrow is hypercellular with increased lambda-restricted plasma cells (62% aspirate, 90% CD138). There is partial expression of CD20, but no monoclonal B-cell population is detected by flow cytometry.    10/25/2016 Initial Diagnosis   Multiple myeloma ([place])   10/25/2016 Miscellaneous   Cytogenetics and FISH showed loss of 13q, which is intermediate risk for MM    11/10/2016 -  Chemotherapy   Revlimid 10 mg (increased to 72m on cycle 2) daily, 3 weeks on, and one week off, dexamethasone 20 mg weekly, was off from early June to mid July 2018 due to her right hip fracture and hospitalization.  Changed to maintenence Revlimid 130mand stopped dexamethasone starting next cycle, 06/10/17, Revlimid was changed to 2m50maily 3 weeks on, 1 week off in 12/2017       01/02/2017 Imaging    DG Hip Unilat w or w/o Pelvis IMPRESSION 1. Degenerative changes lumbar spine and both hips. No evidence of fracture or dislocation.  2. Diffuse osteopenia. Multiple tiny lucencies are noted throughout both proximal femurs. These may be secondary to the patient's known multiple myeloma.   01/18/2017 Imaging    CT hip right wo contrast  IMPRESSION: Findings consistent with a pathologic fracture of the right femoral neck. A large lytic lesion in the superior aspect of the lateral femoral head is identified.  Possible nondisplaced fracture of the right inferior pubic ramus.  Diffuse myeloma.   01/18/2017 -  01/23/2017 Hospital Admission   She present to the ED with complaints of severe right hip pain. she recieved a right hip hemiarthroplasty with Dr. MurPercell Miller   CURRENT THERAPY: Maintenance Revlimid2mg81mily for 3 weeks on and 1 week offstarting 05/2017  INTERVAL HISTORY: Kristin [id number]. female returns to the clinic today for follow-up visit accompanied by her daughter.  The patient is feeling well today without any concerning complaints except for generalized weakness/deconditioning. The patient lives at home alone; however, her family comes by daily. They are inquiring about physical therapy. The patient denies any recent fever, chills, night sweats, or unexpected weight loss. She states she has a good appetite. Denies early satiety or abdominal pain.  She denies any signs and symptoms of infection.  She denies any abnormal bleeding or bruising. She has chronic back pain for which she is prescribed morphine. She denies any rashes or skin changes. The patient is here today for evaluation and repeat blood work.  MEDICAL HISTORY: Past Medical History:  Diagnosis Date  . [facility] "[facility] tx[facility] by Dr. FengBurr MedicoeslBoyking" (10/09/2017)  . Chronic deep vein thrombosis (DVT) of both femoral veins (HCC)[facility]/23/19: Chronic recanalized CFV DVT bilateral 12/05/2017  . Critical lower limb ischemia 10/04/2017   Critical limb ischemia  . Depression    Risperdal "twice/day" (09/23/2016)  . Glaucoma   . Hiatal hernia   . Intractable back pain    /notes 09/23/2016  . Obesity   . Osteoarthritis    "  all over" (10/09/2017)  . Osteoporosis    vertebral fracture in 2012  . Peripheral vascular disease ([place])   . Upper GI bleed 04/2004   secondary to Mallory-Weiss tear/notes 12/28/2010  . Vertigo     ALLERGIES:  is allergic to penicillins, tramadol, alendronate, and sulfonamide derivatives.  MEDICATIONS:  Current Outpatient Medications  Medication Sig Dispense Refill  . amLODipine (NORVASC)  2.5 MG tablet Take 2.5 mg by mouth daily.    . ASPIRIN ADULT LOW STRENGTH 81 MG EC tablet Take 81 mg by mouth daily.    Marland Kitchen atorvastatin (LIPITOR) 10 MG tablet Take 10 mg by mouth daily.    . Calcium Carbonate-Vit D-Min (CALCIUM-VITAMIN D-MINERALS) 600-800 MG-UNIT CHEW Chew 1 tablet by mouth 2 (two) times daily.     . chlorhexidine (PERIDEX) 0.12 % solution 2 (two) times a day.    . furosemide (LASIX) 20 MG tablet Take 20 mg by mouth daily.    . irbesartan (AVAPRO) 75 MG tablet Take 75 mg by mouth daily after supper.    Marland Kitchen lenalidomide (REVLIMID) 5 MG capsule Take 1 capsule (5 mg total) by mouth daily. Take daily x 3 weeks then 1 week rest. 21 capsule 0  . magnesium oxide (MAG-OX) 400 MG tablet Take 1 tablet by mouth daily.    . megestrol (MEGACE) 40 MG tablet Take 40 mg by mouth daily.    . mirtazapine (REMERON) 7.5 MG tablet Take 7.5 mg by mouth at bedtime.     Marland Kitchen morphine ([name]) 15 MG 12 hr tablet Take 1 tablet (15 mg total) by mouth every 12 (twelve) hours. 60 tablet 0  . Oxycodone HCl 10 MG TABS Take 1 tablet (10 mg total) by mouth every 8 (eight) hours as needed. FOR MODERATE OR SEVERE PAIN 60 tablet 0  . risperiDONE (RISPERDAL) 0.5 MG tablet Take 0.5 mg by mouth at bedtime.     . sennosides-docusate sodium (SENOKOT-S) 8.6-50 MG tablet Take 1 tablet by mouth 2 (two) times daily.     Alveda Reasons 2.5 MG TABS tablet TAKE ONE TABLET BY MOUTH EVERY MORNING and TAKE ONE TABLET BY MOUTH AT NOON 180 tablet 1  . feeding supplement, ENSURE ENLIVE, (ENSURE ENLIVE) LIQD Take 237 mLs by mouth 2 (two) times daily between meals. (Patient not taking: Reported on 05/11/2020) 237 mL 12   No current facility-administered medications for this visit.   Facility-Administered Medications Ordered in Other Visits  Medication Dose Route Frequency Provider Last Rate Last Admin  . denosumab (XGEVA) injection 120 mg  120 mg Subcutaneous Once Truitt Merle, MD        SURGICAL HISTORY:  Past Surgical History:  Procedure  Laterality Date  . ABDOMINAL HYSTERECTOMY  1974   partial  . APPENDECTOMY     as a child/notes 12/28/2010  . BACK SURGERY    . BREAST BIOPSY Left 1999   Archie Endo 12/28/2010  . CATARACT EXTRACTION W/ INTRAOCULAR LENS  IMPLANT, BILATERAL Bilateral 2000s  . ESOPHAGOGASTRODUODENOSCOPY  04/2004   Archie Endo 12/28/2010  . FIXATION KYPHOPLASTY THORACIC SPINE  05/2011   T10/notes 05/25/2011  . FRACTURE SURGERY    . HIP ARTHROPLASTY Right 01/19/2017   Procedure: RIGHT HIP HEMIARTHROPLASTY;  Surgeon: Renette Butters, MD;  Location: [facility];  Service: Orthopedics;  Laterality: Right;  . LAPAROSCOPIC CHOLECYSTECTOMY  10/2007   Archie Endo 12/14/2010  . LOWER EXTREMITY ANGIOGRAPHY N/A 10/09/2017   Procedure: LOWER EXTREMITY ANGIOGRAPHY;  Surgeon: Lorretta Harp, MD;  Location: [facility];  Service:  Cardiovascular;  Laterality: N/A;  bilateral  . LOWER EXTREMITY ANGIOGRAPHY  11/21/2017   Procedure: Lower Extremity Angiography;  Surgeon: Adrian Prows, MD;  Location: [facility];  Service: Cardiovascular;;  . PERIPHERAL VASCULAR BALLOON ANGIOPLASTY  10/09/2017   Attempted PTA Archie Endo 10/09/2017  . PERIPHERAL VASCULAR BALLOON ANGIOPLASTY  10/09/2017   Procedure: PERIPHERAL VASCULAR BALLOON ANGIOPLASTY;  Surgeon: Lorretta Harp, MD;  Location: [facility];  Service: Cardiovascular;;  Attempted PTA     REVIEW OF SYSTEMS:   Review of Systems  Constitutional: Positive for fatigue and generalized weakness. Negative for appetite change, chills, fatigue, fever and unexpected weight change.  HENT: Negative for mouth sores, nosebleeds, sore throat and trouble swallowing.   Eyes: Negative for eye problems and icterus.  Respiratory: Negative for cough, hemoptysis, shortness of breath and wheezing.   Cardiovascular: Positive for lower extremity swelling (follows with cardiology). Negative for chest pain. Gastrointestinal: Negative for abdominal pain, constipation, diarrhea, nausea and vomiting.   Genitourinary: Negative for bladder incontinence, difficulty urinating, dysuria, frequency and hematuria.   Musculoskeletal: Negative for back pain, gait problem, neck pain and neck stiffness.  Skin: Negative for itching and rash.  Neurological: Negative for dizziness, extremity weakness, gait problem, headaches, light-headedness and seizures.  Hematological: Negative for adenopathy. Does not bruise/bleed easily.  Psychiatric/Behavioral: Negative for confusion, depression and sleep disturbance. The patient is not nervous/anxious.     PHYSICAL EXAMINATION:  Blood pressure (!) 152/76, pulse 80, temperature 99.2 F (37.3 C), temperature source Tympanic, resp. rate 18, height 5' (1.524 m), weight 163 lb 3.2 oz (74 kg), SpO2 97 %.  ECOG PERFORMANCE STATUS: 1 - Symptomatic but completely ambulatory  Physical Exam  Constitutional: Oriented to person, place, and time and elderly appearing female and in no distress.  HENT:  Head: Normocephalic and atraumatic.  Mouth/Throat: Oropharynx is clear and moist. No oropharyngeal exudate.  Eyes: Conjunctivae are normal. Right eye exhibits no discharge. Left eye exhibits no discharge. No scleral icterus.  Neck: Normal range of motion. Neck supple.  Cardiovascular: Normal rate, regular rhythm, normal heart sounds and intact distal pulses.   Pulmonary/Chest: Effort normal and breath sounds normal. No respiratory distress. No wheezes. No rales.  Abdominal: Soft. Bowel sounds are normal. Exhibits no distension and no mass. There is no tenderness.  Musculoskeletal: Normal range of motion. Exhibits no edema.  Lymphadenopathy:    No cervical adenopathy.  Neurological: Alert and oriented to person, place, and time. Exhibits muscle wasting. Examined in the wheelchair.  Skin: Skin is warm and dry. No rash noted. Not diaphoretic. No erythema. No pallor.  Psychiatric: Mood, memory and judgment normal.  Vitals reviewed.  LABORATORY DATA: Lab Results  Component  Value Date   WBC 5.8 05/11/2020   HGB 11.5 (L) 05/11/2020   HCT 33.7 (L) 05/11/2020   MCV 96.0 05/11/2020   PLT 216 05/11/2020      Chemistry      Component Value Date/Time   NA 139 05/11/2020 1432   NA 142 10/05/2017 1147   NA 140 08/18/2017 1317   K 3.5 05/11/2020 1432   K 4.1 08/18/2017 1317   CL 104 05/11/2020 1432   CO2 30 05/11/2020 1432   CO2 25 08/18/2017 1317   BUN 22 05/11/2020 1432   BUN 15 10/05/2017 1147   BUN 14.7 08/18/2017 1317   CREATININE 1.69 (H) 05/11/2020 1432   CREATININE 1.07 (H) 04/06/2018 1538   CREATININE 1.1 08/18/2017 1317      Component Value Date/Time  CALCIUM 9.5 05/11/2020 1432   CALCIUM 9.0 08/18/2017 1317   ALKPHOS 72 05/11/2020 1432   ALKPHOS 58 08/18/2017 1317   AST 21 05/11/2020 1432   AST 19 04/06/2018 1538   AST 12 08/18/2017 1317   ALT 10 05/11/2020 1432   ALT 18 04/06/2018 1538   ALT 8 08/18/2017 1317   BILITOT 0.7 05/11/2020 1432   BILITOT 0.7 04/06/2018 1538   BILITOT 0.57 08/18/2017 1317       RADIOGRAPHIC STUDIES:  No results found.   ASSESSMENT/PLAN:  SHAUNDA TIPPING is a 86 y.o. female with    1. Multiple Myeloma, lambda light chain disease, staging III, intermediate risk (13q-) -She was diagnosed in March 2018, initially treated with Revlimid and dexamethasonefor 6 month with good responseand ir currently being treated with maintenanceRevlimid 5 mg once daily 3 weeks on 1 week off. -Delton See has been held due to concern of jaw necrosis -Her M protein has been nondetectable for the past year.She has had a good response to treatment. She will repeat MM panel every3 months.  -Labs reviewed, CBC and CMP WNL except mild anemia with a Hgb 11.5. Cr 1.69 at baseline. Her M protein has been undetected. Today's MM panel still pending.  -Continue Maintenance Revlimid at same dose. She is tolerating well. -F/uin 60month or sooner if she has concerning findings from today's pending lab studies.   2.  Anemia -Due to MM and Revlimid. -Will consider blood transfusion if Hb <8 or symptomatic anemia with Hg 8-9 -Today mild anemia with Hgb 11.5. Stable.   3. Chronic CKD, stage III -Avoid nephrotoxic drugs and contrasts.  4.Right hip pain from pathologic fracture -Compression fracture at T2, pain mostly resolved.  -Her main pain is in her right hip and well controlled onMS Contin161mBID and oxycodone 1070mwice a day as needed.Her daughter feels she is doing well on reduced dose morphine. -She will continue Senakot as needed to prevent constipation.   5. HTN -f/u with PCP and continue medications  6. Osteoporosis, Recent Fall -Xgeva started in 5/21/18and has been held since 03/27/18 due toconcernfor jaw necrosis -Dr. FenBurr Medicoeviously advised her to continue oral calcium and Vitamin D.  -She did have fall in 06/2019.I encouraged her toambulate with walker. She has medical alert necklace. -Dr. FenBurr Medicoviously encouraged her to get up periodically as to prevent bed sores. She is agreeable. -Her daughter would like her to proceed with PT to help her, patient is interested in doing this at home. Dr. FenBurr Medicod sent referral at her last visit. They had never heard from physical therapy.  -I will replace the order for home health physical therapy today. They were advised to please call me back if they do not hear from home health regarding this referral so we can follow up.   7. Goal of care discussion  -The patient understands the goal of care is palliative. -Dr. FenBurr Medicoeviously recommended DNR/DNI, she will think about it  8. Right foot ulcer  -She is getting work up done by Dr. GanNadyne Coombeshere is concern this is related to her low mobility.    Plan -Continue Revlimidat same dose -Lab and f/u in3mo69monthsooner if labs today show concerning findings.  -SPEP pending today, will call if needs to be evaluated sooner pending the lab results. -Placed home health  referral for physical therapy    Orders Placed This Encounter  Procedures  . Ambulatory referral to Home Health    Referral Priority:   Routine  Referral Type:   Home Health Care    Referral Reason:   Specialty Services Required    Requested Specialty:   [place]    Number of Visits Requested:   [place], PA-C 05/11/20

## 2020-05-11 ENCOUNTER — Inpatient Hospital Stay: Payer: Medicare Other

## 2020-05-11 ENCOUNTER — Telehealth: Payer: Self-pay | Admitting: Hematology

## 2020-05-11 ENCOUNTER — Inpatient Hospital Stay: Payer: Medicare Other | Attending: Physician Assistant | Admitting: Physician Assistant

## 2020-05-11 ENCOUNTER — Other Ambulatory Visit: Payer: Self-pay

## 2020-05-11 VITALS — BP 152/76 | HR 80 | Temp 99.2°F | Resp 18 | Ht 60.0 in | Wt 163.2 lb

## 2020-05-11 DIAGNOSIS — R531 Weakness: Secondary | ICD-10-CM | POA: Diagnosis not present

## 2020-05-11 DIAGNOSIS — I129 Hypertensive chronic kidney disease with stage 1 through stage 4 chronic kidney disease, or unspecified chronic kidney disease: Secondary | ICD-10-CM | POA: Insufficient documentation

## 2020-05-11 DIAGNOSIS — N183 Chronic kidney disease, stage 3 unspecified: Secondary | ICD-10-CM | POA: Diagnosis not present

## 2020-05-11 DIAGNOSIS — Z9181 History of falling: Secondary | ICD-10-CM | POA: Insufficient documentation

## 2020-05-11 DIAGNOSIS — D6481 Anemia due to antineoplastic chemotherapy: Secondary | ICD-10-CM | POA: Insufficient documentation

## 2020-05-11 DIAGNOSIS — G8929 Other chronic pain: Secondary | ICD-10-CM | POA: Diagnosis not present

## 2020-05-11 DIAGNOSIS — C9000 Multiple myeloma not having achieved remission: Secondary | ICD-10-CM | POA: Insufficient documentation

## 2020-05-11 DIAGNOSIS — M549 Dorsalgia, unspecified: Secondary | ICD-10-CM | POA: Diagnosis not present

## 2020-05-11 DIAGNOSIS — M810 Age-related osteoporosis without current pathological fracture: Secondary | ICD-10-CM | POA: Insufficient documentation

## 2020-05-11 DIAGNOSIS — L97519 Non-pressure chronic ulcer of other part of right foot with unspecified severity: Secondary | ICD-10-CM | POA: Diagnosis not present

## 2020-05-11 DIAGNOSIS — D630 Anemia in neoplastic disease: Secondary | ICD-10-CM | POA: Insufficient documentation

## 2020-05-11 LAB — CBC WITH DIFFERENTIAL/PLATELET
Abs Immature Granulocytes: 0.02 10*3/uL (ref 0.00–0.07)
Basophils Absolute: 0 10*3/uL (ref 0.0–0.1)
Basophils Relative: 0 %
Eosinophils Absolute: 0.3 10*3/uL (ref 0.0–0.5)
Eosinophils Relative: 5 %
HCT: 33.7 % — ABNORMAL LOW (ref 36.0–46.0)
Hemoglobin: 11.5 g/dL — ABNORMAL LOW (ref 12.0–15.0)
Immature Granulocytes: 0 %
Lymphocytes Relative: 44 %
Lymphs Abs: 2.5 10*3/uL (ref 0.7–4.0)
MCH: 32.8 pg (ref 26.0–34.0)
MCHC: 34.1 g/dL (ref 30.0–36.0)
MCV: 96 fL (ref 80.0–100.0)
Monocytes Absolute: 0.7 10*3/uL (ref 0.1–1.0)
Monocytes Relative: 11 %
Neutro Abs: 2.3 10*3/uL (ref 1.7–7.7)
Neutrophils Relative %: 40 %
Platelets: 216 10*3/uL (ref 150–400)
RBC: 3.51 MIL/uL — ABNORMAL LOW (ref 3.87–5.11)
RDW: 14.7 % (ref 11.5–15.5)
WBC: 5.8 10*3/uL (ref 4.0–10.5)
nRBC: 0 % (ref 0.0–0.2)

## 2020-05-11 LAB — COMPREHENSIVE METABOLIC PANEL
ALT: 10 U/L (ref 0–44)
AST: 21 U/L (ref 15–41)
Albumin: 3.4 g/dL — ABNORMAL LOW (ref 3.5–5.0)
Alkaline Phosphatase: 72 U/L (ref 38–126)
Anion gap: 5 (ref 5–15)
BUN: 22 mg/dL (ref 8–23)
CO2: 30 mmol/L (ref 22–32)
Calcium: 9.5 mg/dL (ref 8.9–10.3)
Chloride: 104 mmol/L (ref 98–111)
Creatinine, Ser: 1.69 mg/dL — ABNORMAL HIGH (ref 0.44–1.00)
GFR calc Af Amer: 31 mL/min — ABNORMAL LOW (ref >60)
GFR calc non Af Amer: 27 mL/min — ABNORMAL LOW (ref >60)
Glucose, Bld: 98 mg/dL (ref 70–99)
Potassium: 3.5 mmol/L (ref 3.5–5.1)
Sodium: 139 mmol/L (ref 135–145)
Total Bilirubin: 0.7 mg/dL (ref 0.3–1.2)
Total Protein: 6.6 g/dL (ref 6.5–8.1)

## 2020-05-11 NOTE — Telephone Encounter (Signed)
Scheduled appointments per 9/27 los. Spoke to patient who is aware of appointments. Gave patient updated calendar.

## 2020-05-12 ENCOUNTER — Telehealth: Payer: Self-pay | Admitting: *Deleted

## 2020-05-12 LAB — MULTIPLE MYELOMA PANEL, SERUM
Albumin SerPl Elph-Mcnc: 3.5 g/dL (ref 2.9–4.4)
Albumin/Glob SerPl: 1.4 (ref 0.7–1.7)
Alpha 1: 0.2 g/dL (ref 0.0–0.4)
Alpha2 Glob SerPl Elph-Mcnc: 0.5 g/dL (ref 0.4–1.0)
B-Globulin SerPl Elph-Mcnc: 0.8 g/dL (ref 0.7–1.3)
Gamma Glob SerPl Elph-Mcnc: 1.1 g/dL (ref 0.4–1.8)
Globulin, Total: 2.6 g/dL (ref 2.2–3.9)
IgA: 275 mg/dL (ref 64–422)
IgG (Immunoglobin G), Serum: 1068 mg/dL (ref 586–1602)
IgM (Immunoglobulin M), Srm: 16 mg/dL — ABNORMAL LOW (ref 26–217)
Total Protein ELP: 6.1 g/dL (ref 6.0–8.5)

## 2020-05-12 LAB — KAPPA/LAMBDA LIGHT CHAINS
Kappa free light chain: 70.4 mg/L — ABNORMAL HIGH (ref 3.3–19.4)
Kappa, lambda light chain ratio: 0.22 — ABNORMAL LOW (ref 0.26–1.65)
Lambda free light chains: 315.3 mg/L — ABNORMAL HIGH (ref 5.7–26.3)

## 2020-05-12 NOTE — Telephone Encounter (Signed)
New Physical Therapy referral.    [facility] unable to accept referral.  Pending response from [facility] as to whether they can accept patient.

## 2020-05-14 ENCOUNTER — Other Ambulatory Visit: Payer: Self-pay | Admitting: *Deleted

## 2020-05-14 ENCOUNTER — Telehealth: Payer: Self-pay

## 2020-05-14 DIAGNOSIS — C9000 Multiple myeloma not having achieved remission: Secondary | ICD-10-CM

## 2020-05-14 NOTE — Telephone Encounter (Signed)
-----   Message from Truitt Merle, MD sent at 05/12/2020 11:16 PM EDT ----- My nurse, please let pt's daughter know her lab results, M-protein negative which is good news. Her light chain level is slightly higher than before, no other new concerns, will see her back in 3 months as planned, thanks   Truitt Merle  05/12/2020

## 2020-05-14 NOTE — Telephone Encounter (Signed)
Called x 2 to speak with daughter was unable to reach daughter called cell phone was told it was wrong number did call back and speak with patient did her aware lab work looked good no new concerns to follow up in 3 months as planned. I also asked daughter call into cancer center

## 2020-05-14 NOTE — Progress Notes (Signed)
[facility] to accept assignment effective May 18, 2020.

## 2020-05-22 ENCOUNTER — Other Ambulatory Visit: Payer: Self-pay

## 2020-05-22 DIAGNOSIS — C9000 Multiple myeloma not having achieved remission: Secondary | ICD-10-CM

## 2020-05-22 MED ORDER — LENALIDOMIDE 5 MG PO CAPS: 5.0000 mg | ORAL_CAPSULE | Freq: Every day | ORAL | 0 refills | Status: DC

## 2020-05-25 ENCOUNTER — Other Ambulatory Visit: Payer: Self-pay | Admitting: Cardiology

## 2020-05-25 DIAGNOSIS — I739 Peripheral vascular disease, unspecified: Secondary | ICD-10-CM

## 2020-05-26 ENCOUNTER — Telehealth: Payer: Self-pay

## 2020-05-26 NOTE — Telephone Encounter (Signed)
[name] called requesting refill for Oxycodone 10 mg tablets and Kristin Contin 15 mg tablets

## 2020-05-27 ENCOUNTER — Other Ambulatory Visit: Payer: Self-pay | Admitting: Hematology

## 2020-05-27 DIAGNOSIS — C9000 Multiple myeloma not having achieved remission: Secondary | ICD-10-CM

## 2020-05-27 MED ORDER — MORPHINE SULFATE ER 15 MG PO TBCR
15.0000 mg | EXTENDED_RELEASE_TABLET | Freq: Two times a day (BID) | ORAL | 0 refills | Status: DC
Start: 2020-05-27 — End: 2020-06-30

## 2020-05-27 MED ORDER — OXYCODONE HCL 10 MG PO TABS: 10.0000 mg | ORAL_TABLET | Freq: Three times a day (TID) | ORAL | 0 refills | Status: DC | PRN

## 2020-06-19 ENCOUNTER — Other Ambulatory Visit: Payer: Self-pay

## 2020-06-19 DIAGNOSIS — C9000 Multiple myeloma not having achieved remission: Secondary | ICD-10-CM

## 2020-06-19 MED ORDER — LENALIDOMIDE 5 MG PO CAPS: 5.0000 mg | ORAL_CAPSULE | Freq: Every day | ORAL | 0 refills | Status: DC

## 2020-06-19 NOTE — Progress Notes (Signed)
Faxed prescription for Revlimid to [facility] [place].

## 2020-06-26 ENCOUNTER — Ambulatory Visit: Payer: Medicare Other | Admitting: Podiatry

## 2020-06-30 ENCOUNTER — Telehealth: Payer: Self-pay | Admitting: *Deleted

## 2020-06-30 ENCOUNTER — Other Ambulatory Visit: Payer: Self-pay | Admitting: Nurse Practitioner

## 2020-06-30 DIAGNOSIS — C9000 Multiple myeloma not having achieved remission: Secondary | ICD-10-CM

## 2020-06-30 MED ORDER — OXYCODONE HCL 10 MG PO TABS
10.0000 mg | ORAL_TABLET | Freq: Three times a day (TID) | ORAL | 0 refills | Status: DC | PRN
Start: 2020-06-30 — End: 2020-07-22

## 2020-06-30 MED ORDER — MORPHINE SULFATE ER 15 MG PO TBCR
15.0000 mg | EXTENDED_RELEASE_TABLET | Freq: Two times a day (BID) | ORAL | 0 refills | Status: DC
Start: 2020-06-30 — End: 2020-07-20

## 2020-06-30 NOTE — Telephone Encounter (Signed)
Refills sent. Thanks, Regan Rakers

## 2020-06-30 NOTE — Telephone Encounter (Signed)
Received call from [facility] requesting refill for pt on her morphine 30 mg & her oxycodone 10 mg. Message to Dr Audie Box.

## 2020-07-02 ENCOUNTER — Other Ambulatory Visit: Payer: Self-pay | Admitting: Nurse Practitioner

## 2020-07-02 DIAGNOSIS — C9000 Multiple myeloma not having achieved remission: Secondary | ICD-10-CM

## 2020-07-03 NOTE — Telephone Encounter (Signed)
Regan Rakers, For your approval or refusal. Gardiner Rhyme, RN

## 2020-07-20 ENCOUNTER — Other Ambulatory Visit: Payer: Self-pay | Admitting: Nurse Practitioner

## 2020-07-20 ENCOUNTER — Other Ambulatory Visit: Payer: Self-pay

## 2020-07-20 ENCOUNTER — Other Ambulatory Visit: Payer: Self-pay | Admitting: Hematology

## 2020-07-20 DIAGNOSIS — C9000 Multiple myeloma not having achieved remission: Secondary | ICD-10-CM

## 2020-07-20 MED ORDER — MORPHINE SULFATE ER 15 MG PO TBCR: 15.0000 mg | EXTENDED_RELEASE_TABLET | Freq: Two times a day (BID) | ORAL | 0 refills | Status: AC

## 2020-07-20 MED ORDER — LENALIDOMIDE 5 MG PO CAPS: 5.0000 mg | ORAL_CAPSULE | Freq: Every day | ORAL | 0 refills | Status: DC

## 2020-07-20 NOTE — Telephone Encounter (Signed)
For review for refill. Gardiner Rhyme, RN

## 2020-07-22 ENCOUNTER — Other Ambulatory Visit: Payer: Self-pay | Admitting: Nurse Practitioner

## 2020-07-22 ENCOUNTER — Ambulatory Visit: Payer: Medicare Other | Admitting: Cardiology

## 2020-07-22 DIAGNOSIS — C9000 Multiple myeloma not having achieved remission: Secondary | ICD-10-CM

## 2020-07-22 MED ORDER — OXYCODONE HCL 10 MG PO TABS: 10.0000 mg | ORAL_TABLET | Freq: Three times a day (TID) | ORAL | 0 refills | Status: DC | PRN

## 2020-07-31 ENCOUNTER — Other Ambulatory Visit: Payer: Self-pay | Admitting: Nurse Practitioner

## 2020-07-31 DIAGNOSIS — C9000 Multiple myeloma not having achieved remission: Secondary | ICD-10-CM

## 2020-07-31 NOTE — Telephone Encounter (Signed)
For your review

## 2020-08-09 NOTE — Progress Notes (Deleted)
[place]   Telephone:(336) [phone] Fax:([phone]   Clinic Follow up Note   Patient Care Team: Kristin Shanks, MD as PCP - General (Family Medicine) 08/09/2020  CHIEF COMPLAINT: Follow up multiple myeloma   SUMMARY OF ONCOLOGIC HISTORY: Oncology History  Multiple myeloma ([place])  10/18/2016 - 10/26/2016 Hospital Admission   Pt was admitted for symptomatic hypercalcemia, AKI, bone marrow biopsy done during hospital stay, and she received Aredia    10/19/2016 Tumor Marker   SPEP showed M-protein 0.4g/dl, serum lambda light chain 5362, ratio 0.00   10/25/2016 Bone Marrow Biopsy    Overall, the marrow is hypercellular with increased lambda-restricted plasma cells (62% aspirate, 90% CD138). There is partial expression of CD20, but no monoclonal B-cell population is detected by flow cytometry.    10/25/2016 Initial Diagnosis   Multiple myeloma ([place])   10/25/2016 Miscellaneous   Cytogenetics and FISH showed loss of 13q, which is intermediate risk for MM    11/10/2016 -  Chemotherapy   Revlimid 10 mg (increased to 17m on cycle 2) daily, 3 weeks on, and one week off, dexamethasone 20 mg weekly, was off from early June to mid July 2018 due to her right hip fracture and hospitalization.  Changed to maintenence Revlimid 1243mand stopped dexamethasone starting next cycle, 06/10/17, Revlimid was changed to 43m143maily 3 weeks on, 1 week off in 12/2017       01/02/2017 Imaging    DG Hip Unilat w or w/o Pelvis IMPRESSION 1. Degenerative changes lumbar spine and both hips. No evidence of fracture or dislocation.  2. Diffuse osteopenia. Multiple tiny lucencies are noted throughout both proximal femurs. These may be secondary to the patient's known multiple myeloma.   01/18/2017 Imaging    CT hip right wo contrast  IMPRESSION: Findings consistent with a pathologic fracture of the right femoral neck. A large lytic lesion in the superior aspect of the lateral femoral head  is identified.  Possible nondisplaced fracture of the right inferior pubic ramus.  Diffuse myeloma.   01/18/2017 - 01/23/2017 Hospital Admission   She present to the ED with complaints of severe right hip pain. she recieved a right hip hemiarthroplasty with Kristin [name]  CURRENT THERAPY: Maintenance Revlimid43mg79mily for 3 weeks on and 1 week offstarting 05/2017  INTERVAL HISTORY: [name] for follow up as scheduled. She was last seen by my colleague Kristin Heilingoetter, PA on 05/11/20. Her M protein remains undetectable, light chains stable. She continues same dose maintenance Revlimid.    REVIEW OF SYSTEMS:   Constitutional: Denies fevers, chills or abnormal weight loss Eyes: Denies blurriness of vision Ears, nose, mouth, throat, and face: Denies mucositis or sore throat Respiratory: Denies cough, dyspnea or wheezes Cardiovascular: Denies palpitation, chest discomfort or lower extremity swelling Gastrointestinal:  Denies nausea, heartburn or change in bowel habits Skin: Denies abnormal skin rashes Lymphatics: Denies new lymphadenopathy or easy bruising Neurological:Denies numbness, tingling or new weaknesses Behavioral/Psych: Mood is stable, no new changes  All other systems were reviewed with the patient and are negative.  MEDICAL HISTORY:  Past Medical History:  Diagnosis Date  . [name]" (10/09/2017)  . Chronic deep vein thrombosis (DVT) of both femoral veins (HCC)[place]/23/19: Chronic recanalized CFV DVT bilateral 12/05/2017  . Critical lower limb ischemia 10/04/2017   Critical limb ischemia  . Depression    Risperdal "twice/day" (09/23/2016)  . Glaucoma   .  Hiatal hernia   . Intractable back pain    /notes 09/23/2016  . Obesity   . Osteoarthritis    "all over" (10/09/2017)  . Osteoporosis    vertebral fracture in 2012  . Peripheral vascular disease ([place])   . Upper GI bleed 04/2004   secondary to Mallory-Weiss  tear/notes 12/28/2010  . Vertigo     SURGICAL HISTORY: Past Surgical History:  Procedure Laterality Date  . ABDOMINAL HYSTERECTOMY  1974   partial  . APPENDECTOMY     as a child/notes 12/28/2010  . BACK SURGERY    . BREAST BIOPSY Left 1999   Kristin [name] 12/28/2010  . CATARACT EXTRACTION W/ INTRAOCULAR LENS  IMPLANT, BILATERAL Bilateral 2000s  . ESOPHAGOGASTRODUODENOSCOPY  04/2004   Kristin [name] 12/28/2010  . FIXATION KYPHOPLASTY THORACIC SPINE  05/2011   T10/notes 05/25/2011  . FRACTURE SURGERY    . HIP ARTHROPLASTY Right 01/19/2017   Procedure: RIGHT HIP HEMIARTHROPLASTY;  Surgeon: Kristin Butters, MD;  Location: [place];  Service: Orthopedics;  Laterality: Right;  . LAPAROSCOPIC CHOLECYSTECTOMY  10/2007   Kristin [name] 12/14/2010  . LOWER EXTREMITY ANGIOGRAPHY N/A 10/09/2017   Procedure: LOWER EXTREMITY ANGIOGRAPHY;  Surgeon: Kristin Harp, MD;  Location: [place] [facility];  Service: Cardiovascular;  Laterality: N/A;  bilateral  . LOWER EXTREMITY ANGIOGRAPHY  11/21/2017   Procedure: Lower Extremity Angiography;  Surgeon: Kristin Prows, MD;  Location: [place] [facility];  Service: Cardiovascular;;  . PERIPHERAL VASCULAR BALLOON ANGIOPLASTY  10/09/2017   Attempted PTA Kristin [name] 10/09/2017  . PERIPHERAL VASCULAR BALLOON ANGIOPLASTY  10/09/2017   Procedure: PERIPHERAL VASCULAR BALLOON ANGIOPLASTY;  Surgeon: Kristin Harp, MD;  Location: [place] [facility];  Service: Cardiovascular;;  Attempted PTA     I have reviewed the social history and family history with the patient and they are unchanged from previous note.  ALLERGIES:  is allergic to penicillins, tramadol, alendronate, and sulfonamide derivatives.  MEDICATIONS:  Current Outpatient Medications  Medication Sig Dispense Refill  . amLODipine (NORVASC) 2.5 MG tablet Take 2.5 mg by mouth daily.    . ASPIRIN ADULT LOW STRENGTH 81 MG EC tablet Take 81 mg by mouth daily.    Marland Kitchen atorvastatin (LIPITOR) 10 MG tablet Take 10 mg by mouth daily.    . Calcium  Carbonate-Vit D-Min (CALCIUM-VITAMIN D-MINERALS) 600-800 MG-UNIT CHEW Chew 1 tablet by mouth 2 (two) times daily.     . chlorhexidine (PERIDEX) 0.12 % solution 2 (two) times a day.    . feeding supplement, ENSURE ENLIVE, (ENSURE ENLIVE) LIQD Take 237 mLs by mouth 2 (two) times daily between meals. (Patient not taking: Reported on 05/11/2020) 237 mL 12  . furosemide (LASIX) 20 MG tablet Take 20 mg by mouth daily.    . irbesartan (AVAPRO) 75 MG tablet Take 75 mg by mouth daily after supper.    Marland Kitchen lenalidomide (REVLIMID) 5 MG capsule Take 1 capsule (5 mg total) by mouth daily. Take 1 capsule daily for 3 weeks, then 1 week rest  [employer] Auth #[id number]     Date Obtained   Jul 20, 2020 21 capsule 0  . magnesium oxide (MAG-OX) 400 MG tablet Take 1 tablet by mouth daily.    . megestrol (MEGACE) 40 MG tablet Take 40 mg by mouth daily.    . mirtazapine (REMERON) 7.5 MG tablet Take 7.5 mg by mouth at bedtime.     Marland Kitchen morphine ([name]) 15 MG 12 hr tablet Take 1 tablet (15 mg total) by mouth every 12 (twelve) hours.  60 tablet 0  . Oxycodone HCl 10 MG TABS TAKE ONE TABLET BY MOUTH EVERY 8 HOURS AS NEEDED FOR moderate OR SEVERE pain 30 tablet 0  . risperiDONE (RISPERDAL) 0.5 MG tablet Take 0.5 mg by mouth at bedtime.     . sennosides-docusate sodium (SENOKOT-S) 8.6-50 MG tablet Take 1 tablet by mouth 2 (two) times daily.     Alveda Reasons 2.5 MG TABS tablet TAKE ONE TABLET BY MOUTH EVERY MORNING and TAKE ONE TABLET BY MOUTH AT NOON 180 tablet 1   No current facility-administered medications for this visit.   Facility-Administered Medications Ordered in Other Visits  Medication Dose Route Frequency Provider Last Rate Last Admin  . denosumab (XGEVA) injection 120 mg  120 mg Subcutaneous Once Truitt Merle, MD        PHYSICAL EXAMINATION: ECOG PERFORMANCE STATUS: {CHL ONC ECOG JK:[id number]}  There were no vitals filed for this visit. There were no vitals filed for this visit.  GENERAL:alert, no distress and  comfortable SKIN: skin color, texture, turgor are normal, no rashes or significant lesions EYES: normal, Conjunctiva are pink and non-injected, sclera clear OROPHARYNX:no exudate, no erythema and lips, buccal mucosa, and tongue normal  NECK: supple, thyroid normal size, non-tender, without nodularity LYMPH:  no palpable lymphadenopathy in the cervical, axillary or inguinal LUNGS: clear to auscultation and percussion with normal breathing effort HEART: regular rate & rhythm and no murmurs and no lower extremity edema ABDOMEN:abdomen soft, non-tender and normal bowel sounds Musculoskeletal:no cyanosis of digits and no clubbing  NEURO: alert & oriented x 3 with fluent speech, no focal motor/sensory deficits  LABORATORY DATA:  I have reviewed the data as listed CBC Latest Ref Rng & Units 05/11/2020 11/28/2019 08/29/2019  WBC 4.0 - 10.5 K/uL 5.8 6.8 4.7  Hemoglobin 12.0 - 15.0 g/dL 11.5(L) 13.4 11.6(L)  Hematocrit 36.0 - 46.0 % 33.7(L) 40.2 34.0(L)  Platelets 150 - 400 K/uL 216 221 181     CMP Latest Ref Rng & Units 05/11/2020 11/28/2019 08/29/2019  Glucose 70 - 99 mg/dL 98 88 154(H)  BUN 8 - 23 mg/dL 22 25(H) 20  Creatinine 0.44 - 1.00 mg/dL 1.69(H) 1.62(H) 1.65(H)  Sodium 135 - 145 mmol/L 139 143 142  Potassium 3.5 - 5.1 mmol/L 3.5 3.7 3.8  Chloride 98 - 111 mmol/L 104 107 108  CO2 22 - 32 mmol/L 30 27 25   Calcium 8.9 - 10.3 mg/dL 9.5 9.9 9.1  Total Protein 6.5 - 8.1 g/dL 6.6 7.7 6.4(L)  Total Bilirubin 0.3 - 1.2 mg/dL 0.7 0.7 0.3  Alkaline Phos 38 - 126 U/L 72 66 67  AST 15 - 41 U/L 21 25 21   ALT 0 - 44 U/L 10 19 16       RADIOGRAPHIC STUDIES: I have personally reviewed the radiological images as listed and agreed with the findings in the report. No results found.   ASSESSMENT & PLAN:  No problem-specific Assessment & Plan notes found for this encounter.   No orders of the defined types were placed in this encounter.  All questions were answered. The patient knows to call the  clinic with any problems, questions or concerns. No barriers to learning was detected. I spent {CHL ONC TIME VISIT - ZJIRC:[id number]} counseling the patient face to face. The total time spent in the appointment was {CHL ONC TIME VISIT - ZWCHE:[id number]} and more than 50% was on counseling and review of test results     Alla Feeling, NP 08/09/20

## 2020-08-10 ENCOUNTER — Ambulatory Visit: Payer: Medicare Other | Admitting: Nurse Practitioner

## 2020-08-10 ENCOUNTER — Other Ambulatory Visit: Payer: Medicare Other

## 2020-08-10 ENCOUNTER — Telehealth: Payer: Self-pay | Admitting: Nurse Practitioner

## 2020-08-10 NOTE — Telephone Encounter (Signed)
Rescheduled appointments per 12/27 schedule message. Patient's daughter is aware of changes.

## 2020-08-12 ENCOUNTER — Ambulatory Visit: Payer: Medicare Other | Admitting: Cardiology

## 2020-08-18 ENCOUNTER — Other Ambulatory Visit: Payer: Self-pay

## 2020-08-18 DIAGNOSIS — C9000 Multiple myeloma not having achieved remission: Secondary | ICD-10-CM

## 2020-08-18 MED ORDER — LENALIDOMIDE 5 MG PO CAPS: 5.0000 mg | ORAL_CAPSULE | Freq: Every day | ORAL | 0 refills | Status: DC

## 2020-08-20 ENCOUNTER — Encounter: Payer: Self-pay | Admitting: Nurse Practitioner

## 2020-08-20 ENCOUNTER — Other Ambulatory Visit: Payer: Self-pay

## 2020-08-20 ENCOUNTER — Inpatient Hospital Stay (HOSPITAL_BASED_OUTPATIENT_CLINIC_OR_DEPARTMENT_OTHER): Payer: Medicare Other | Admitting: Nurse Practitioner

## 2020-08-20 ENCOUNTER — Inpatient Hospital Stay: Payer: Medicare Other | Attending: Hematology

## 2020-08-20 VITALS — BP 127/59 | HR 93 | Temp 98.1°F | Resp 18 | Ht 60.0 in | Wt 159.5 lb

## 2020-08-20 DIAGNOSIS — N183 Chronic kidney disease, stage 3 unspecified: Secondary | ICD-10-CM | POA: Diagnosis not present

## 2020-08-20 DIAGNOSIS — F329 Major depressive disorder, single episode, unspecified: Secondary | ICD-10-CM | POA: Diagnosis not present

## 2020-08-20 DIAGNOSIS — C9000 Multiple myeloma not having achieved remission: Secondary | ICD-10-CM

## 2020-08-20 DIAGNOSIS — I70229 Atherosclerosis of native arteries of extremities with rest pain, unspecified extremity: Secondary | ICD-10-CM | POA: Insufficient documentation

## 2020-08-20 DIAGNOSIS — M810 Age-related osteoporosis without current pathological fracture: Secondary | ICD-10-CM | POA: Insufficient documentation

## 2020-08-20 DIAGNOSIS — K449 Diaphragmatic hernia without obstruction or gangrene: Secondary | ICD-10-CM | POA: Diagnosis not present

## 2020-08-20 DIAGNOSIS — E669 Obesity, unspecified: Secondary | ICD-10-CM | POA: Diagnosis not present

## 2020-08-20 DIAGNOSIS — I739 Peripheral vascular disease, unspecified: Secondary | ICD-10-CM | POA: Diagnosis not present

## 2020-08-20 NOTE — Progress Notes (Signed)
[place]   Telephone:(336) [phone] Fax:(336) [phone]   Clinic Follow up Note   Patient Care Team: Kristin Shanks, MD as PCP - General (Family Medicine) 08/20/2020  CHIEF COMPLAINT: Follow-up multiple myeloma  SUMMARY OF ONCOLOGIC HISTORY: Oncology History  Multiple myeloma ([place])  10/18/2016 - 10/26/2016 Hospital Admission   Pt was admitted for symptomatic hypercalcemia, AKI, bone marrow biopsy done during hospital stay, and she received Aredia    10/19/2016 Tumor Marker   SPEP showed M-protein 0.4g/dl, serum lambda light chain 5362, ratio 0.00   10/25/2016 Bone Marrow Biopsy    Overall, the marrow is hypercellular with increased lambda-restricted plasma cells (62% aspirate, 90% CD138). There is partial expression of CD20, but no monoclonal B-cell population is detected by flow cytometry.    10/25/2016 Initial Diagnosis   Multiple myeloma ([place])   10/25/2016 Miscellaneous   Cytogenetics and FISH showed loss of 13q, which is intermediate risk for MM    11/10/2016 -  Chemotherapy   Revlimid 10 mg (increased to 39m on cycle 2) daily, 3 weeks on, and one week off, dexamethasone 20 mg weekly, was off from early June to mid July 2018 due to her right hip fracture and hospitalization.  Changed to maintenence Revlimid 167mand stopped dexamethasone starting next cycle, 06/10/17, Revlimid was changed to 18m9maily 3 weeks on, 1 week off in 12/2017       01/02/2017 Imaging    DG Hip Unilat w or w/o Pelvis IMPRESSION 1. Degenerative changes lumbar spine and both hips. No evidence of fracture or dislocation.  2. Diffuse osteopenia. Multiple tiny lucencies are noted throughout both proximal femurs. These may be secondary to the patient's known multiple myeloma.   01/18/2017 Imaging    CT hip right wo contrast  IMPRESSION: Findings consistent with a pathologic fracture of the right femoral neck. A large lytic lesion in the superior aspect of the lateral femoral head is  identified.  Possible nondisplaced fracture of the right inferior pubic ramus.  Diffuse myeloma.   01/18/2017 - 01/23/2017 Hospital Admission   She present to the ED with complaints of severe right hip pain. she recieved a right hip hemiarthroplasty with Dr. MurPercell Miller  CURRENT THERAPY: Maintenance Revlimid18mg27mily for 3 weeks on and 1 week offstarting 05/2017  INTERVAL HISTORY: Ms. Ray Jeanell Sparrowurns for follow-up as scheduled.  She was last seen [place] my colleague Cassie, PA [place].  She presents in a wheelchair with her daughter.  Labs were not obtained after multiple sticks today.  She continues Revlimid 5 mg daily for 3 weeks on and 1 week off.  P.o. intake is adequate but performance status and cognitive function have really declined in the last few months.  She continues to live alone but needs 24-hour care, a nurse comes in the morning then family alternate taking care of her in the evening.  They have cameras on her at night.  The goal is to keep her at home.  This is being followed by Dr. WongJacelyn Griphe has stable left hip pain, ambulating with a walker.  Denies new or worsening pain.  Family has weaned her off of oxycodone, pain is well managed with [name] twice daily.  Takes a stool softener at night, no nausea/vomiting or constipation.  She has a red tender right first toe, podiatry follow-up soon.  Denies fever, chills, cough, chest pain, dyspnea, urinary symptoms, or new concerns.   MEDICAL HISTORY:  Past Medical History:  Diagnosis  Date  . Bone cancer [facility])    "being tx'd by Dr. Burr Medico @ [facility]" (10/09/2017)  . Chronic deep vein thrombosis (DVT) of both femoral veins ([place]): 12/05/17: Chronic recanalized CFV DVT bilateral 12/05/2017  . Critical lower limb ischemia 10/04/2017   Critical limb ischemia  . Depression    Risperdal "twice/day" (09/23/2016)  . Glaucoma   . Hiatal hernia   . Intractable back pain    /notes 09/23/2016  . Obesity   . Osteoarthritis    "all over"  (10/09/2017)  . Osteoporosis    vertebral fracture in 2012  . Peripheral vascular disease ([place])   . Upper GI bleed 04/2004   secondary to Mallory-Weiss tear/notes 12/28/2010  . Vertigo     SURGICAL HISTORY: Past Surgical History:  Procedure Laterality Date  . ABDOMINAL HYSTERECTOMY  1974   partial  . APPENDECTOMY     as a child/notes 12/28/2010  . BACK SURGERY    . BREAST BIOPSY Left 1999   Archie Endo 12/28/2010  . CATARACT EXTRACTION W/ INTRAOCULAR LENS  IMPLANT, BILATERAL Bilateral 2000s  . ESOPHAGOGASTRODUODENOSCOPY  04/2004   Archie Endo 12/28/2010  . FIXATION KYPHOPLASTY THORACIC SPINE  05/2011   T10/notes 05/25/2011  . FRACTURE SURGERY    . HIP ARTHROPLASTY Right 01/19/2017   Procedure: RIGHT HIP HEMIARTHROPLASTY;  Surgeon: Renette Butters, MD;  Location: [place];  Service: Orthopedics;  Laterality: Right;  . LAPAROSCOPIC CHOLECYSTECTOMY  10/2007   Archie Endo 12/14/2010  . LOWER EXTREMITY ANGIOGRAPHY N/A 10/09/2017   Procedure: LOWER EXTREMITY ANGIOGRAPHY;  Surgeon: Lorretta Harp, MD;  Location: [place] [facility];  Service: Cardiovascular;  Laterality: N/A;  bilateral  . LOWER EXTREMITY ANGIOGRAPHY  11/21/2017   Procedure: Lower Extremity Angiography;  Surgeon: Adrian Prows, MD;  Location: [place] [facility];  Service: Cardiovascular;;  . PERIPHERAL VASCULAR BALLOON ANGIOPLASTY  10/09/2017   Attempted PTA Archie Endo 10/09/2017  . PERIPHERAL VASCULAR BALLOON ANGIOPLASTY  10/09/2017   Procedure: PERIPHERAL VASCULAR BALLOON ANGIOPLASTY;  Surgeon: Lorretta Harp, MD;  Location: [place] [facility];  Service: Cardiovascular;;  Attempted PTA     I have reviewed the social history and family history with the patient and they are unchanged from previous note.  ALLERGIES:  is allergic to penicillins, tramadol, alendronate, and sulfonamide derivatives.  MEDICATIONS:  Current Outpatient Medications  Medication Sig Dispense Refill  . amLODipine (NORVASC) 2.5 MG tablet Take 2.5 mg by mouth daily.    .  ASPIRIN ADULT LOW STRENGTH 81 MG EC tablet Take 81 mg by mouth daily.    Marland Kitchen atorvastatin (LIPITOR) 10 MG tablet Take 10 mg by mouth daily.    . Calcium Carbonate-Vit D-Min (CALCIUM-VITAMIN D-MINERALS) 600-800 MG-UNIT CHEW Chew 1 tablet by mouth 2 (two) times daily.     . chlorhexidine (PERIDEX) 0.12 % solution 2 (two) times a day.    . feeding supplement, ENSURE ENLIVE, (ENSURE ENLIVE) LIQD Take 237 mLs by mouth 2 (two) times daily between meals. (Patient not taking: Reported on 05/11/2020) 237 mL 12  . furosemide (LASIX) 20 MG tablet Take 20 mg by mouth daily.    . irbesartan (AVAPRO) 75 MG tablet Take 75 mg by mouth daily after supper.    Marland Kitchen lenalidomide (REVLIMID) 5 MG capsule Take 1 capsule (5 mg total) by mouth daily. for 3 weeks, then 1 week rest [place] # [id number]     Date Obtained   Aug 18, 2020 21 capsule 0  . magnesium oxide (MAG-OX) 400 MG tablet Take 1  tablet by mouth daily.    . megestrol (MEGACE) 40 MG tablet Take 40 mg by mouth daily.    . mirtazapine (REMERON) 7.5 MG tablet Take 7.5 mg by mouth at bedtime.     Marland Kitchen morphine ([name]) 15 MG 12 hr tablet Take 1 tablet (15 mg total) by mouth every 12 (twelve) hours. 60 tablet 0  . Oxycodone HCl 10 MG TABS TAKE ONE TABLET BY MOUTH EVERY 8 HOURS AS NEEDED FOR moderate OR SEVERE pain 30 tablet 0  . risperiDONE (RISPERDAL) 0.5 MG tablet Take 0.5 mg by mouth at bedtime.     . sennosides-docusate sodium (SENOKOT-S) 8.6-50 MG tablet Take 1 tablet by mouth 2 (two) times daily.     Alveda Reasons 2.5 MG TABS tablet TAKE ONE TABLET BY MOUTH EVERY MORNING and TAKE ONE TABLET BY MOUTH AT NOON 180 tablet 1   No current facility-administered medications for this visit.   Facility-Administered Medications Ordered in Other Visits  Medication Dose Route Frequency Provider Last Rate Last Admin  . denosumab (XGEVA) injection 120 mg  120 mg Subcutaneous Once Truitt Merle, MD        PHYSICAL EXAMINATION: ECOG PERFORMANCE STATUS: 3 - Symptomatic, >50%  confined to bed  Vitals:   08/20/20 1242  BP: (!) 127/59  Pulse: 93  Resp: 18  Temp: 98.1 F (36.7 C)  SpO2: 99%   Filed Weights   08/20/20 1242  Weight: 159 lb 8 oz (72.3 kg)    GENERAL:alert, no distress and comfortable SKIN: Dry skin, no rash EYES: sclera clear LUNGS: clear with normal breathing effort HEART: regular rate & rhythm, mild bilateral lower extremity edema Musculoskeletal: Right great toe with distal erythema and dystrophic nail.  No signs of infection NEURO: alert, drowsy, oriented to person and place   LABORATORY DATA:  I have reviewed the data as listed CBC Latest Ref Rng & Units 05/11/2020 11/28/2019 08/29/2019  WBC 4.0 - 10.5 K/uL 5.8 6.8 4.7  Hemoglobin 12.0 - 15.0 g/dL 11.5(L) 13.4 11.6(L)  Hematocrit 36.0 - 46.0 % 33.7(L) 40.2 34.0(L)  Platelets 150 - 400 K/uL 216 221 181     CMP Latest Ref Rng & Units 05/11/2020 11/28/2019 08/29/2019  Glucose 70 - 99 mg/dL 98 88 154(H)  BUN 8 - 23 mg/dL 22 25(H) 20  Creatinine 0.44 - 1.00 mg/dL 1.69(H) 1.62(H) 1.65(H)  Sodium 135 - 145 mmol/L 139 143 142  Potassium 3.5 - 5.1 mmol/L 3.5 3.7 3.8  Chloride 98 - 111 mmol/L 104 107 108  CO2 22 - 32 mmol/L 30 27 25   Calcium 8.9 - 10.3 mg/dL 9.5 9.9 9.1  Total Protein 6.5 - 8.1 g/dL 6.6 7.7 6.4(L)  Total Bilirubin 0.3 - 1.2 mg/dL 0.7 0.7 0.3  Alkaline Phos 38 - 126 U/L 72 66 67  AST 15 - 41 U/L 21 25 21   ALT 0 - 44 U/L 10 19 16       RADIOGRAPHIC STUDIES: I have personally reviewed the radiological images as listed and agreed with the findings in the report. No results found.   ASSESSMENT & PLAN: ALISIA VANENGEN is a 87 y.o. female with    1. Multiple Myeloma, lambda light chain disease, staging III, intermediate risk (13q-) -Diagnosed in March 2018, initially treated with Revlimid and dexamethasonefor 6 month with good response, currently treated with maintenanceRevlimid 5 mg once daily 3 weeks on 1 week off. -Delton See has been held due to concern of jaw  necrosis -Her M protein has been undetectable  for over a year.She has had a good response to treatment.  -She is clinically asymptomatic. Left hip pain from fall is stable. She was able to come off oxyIR and continue [name] BID with adequate control   2. CKD stage III -stable, avoid nephrotoxic agents  3. Cognitive and physical decline decline -progressed recently, needs 24 hour care but still lives alone -family are very involved in her care, followed by Dr. Jacelyn Grip    Dispo:  Ms. Krahn appears physically stable but noticeable cognitive decline. She continues maintenance revlimid 5 mg daily 3 weeks on/1 week off which she tolerates well. Labs stable in 04/2020 but unobtainable today after multiple sticks.  Daughter agrees to bring her back next week for labs. There is no clinical evidence of disease progression.  We discussed that her myeloma has been stable with undetectable M protein on maintenance Revlimid for over a year, she is asymptomatic of the disease.  Given her recent cognitive and physical decline, I encouraged the family to have a goals of care discussion. Would be reasonable to consider treatment break. Daughter understands without treatment, MM will progress. Family will think about it.   Continue Revlimid at current dose for now, I will touch base after redraw labs next week. Follow up in 6-8 weeks.  All questions were answered. The patient knows to call the clinic with any problems, questions or concerns. No barriers to learning were detected. Total encounter time was 30 minutes.      Kristin Feeling, NP 08/20/20

## 2020-08-21 ENCOUNTER — Ambulatory Visit (INDEPENDENT_AMBULATORY_CARE_PROVIDER_SITE_OTHER): Payer: Medicare Other | Admitting: Podiatry

## 2020-08-21 ENCOUNTER — Telehealth: Payer: Self-pay | Admitting: Nurse Practitioner

## 2020-08-21 DIAGNOSIS — L539 Erythematous condition, unspecified: Secondary | ICD-10-CM

## 2020-08-21 DIAGNOSIS — I739 Peripheral vascular disease, unspecified: Secondary | ICD-10-CM | POA: Diagnosis not present

## 2020-08-21 DIAGNOSIS — L089 Local infection of the skin and subcutaneous tissue, unspecified: Secondary | ICD-10-CM | POA: Diagnosis not present

## 2020-08-21 MED ORDER — DOXYCYCLINE HYCLATE 100 MG PO TABS: 100.0000 mg | ORAL_TABLET | Freq: Two times a day (BID) | ORAL | 0 refills | Status: AC

## 2020-08-21 NOTE — Telephone Encounter (Signed)
Scheduled appointments per 1/6 los. Spoke to patient's daughter who is aware of appointments dates and times.

## 2020-08-24 ENCOUNTER — Other Ambulatory Visit: Payer: Self-pay

## 2020-08-24 ENCOUNTER — Inpatient Hospital Stay: Payer: Medicare Other

## 2020-08-24 ENCOUNTER — Other Ambulatory Visit: Payer: Medicare Other

## 2020-08-24 DIAGNOSIS — C9000 Multiple myeloma not having achieved remission: Secondary | ICD-10-CM | POA: Diagnosis not present

## 2020-08-24 LAB — CBC WITH DIFFERENTIAL/PLATELET
Abs Immature Granulocytes: 0.01 10*3/uL (ref 0.00–0.07)
Basophils Absolute: 0 10*3/uL (ref 0.0–0.1)
Basophils Relative: 0 %
Eosinophils Absolute: 0.2 10*3/uL (ref 0.0–0.5)
Eosinophils Relative: 4 %
HCT: 29.2 % — ABNORMAL LOW (ref 36.0–46.0)
Hemoglobin: 9.7 g/dL — ABNORMAL LOW (ref 12.0–15.0)
Immature Granulocytes: 0 %
Lymphocytes Relative: 50 %
Lymphs Abs: 2.9 10*3/uL (ref 0.7–4.0)
MCH: 33.6 pg (ref 26.0–34.0)
MCHC: 33.2 g/dL (ref 30.0–36.0)
MCV: 101 fL — ABNORMAL HIGH (ref 80.0–100.0)
Monocytes Absolute: 0.9 10*3/uL (ref 0.1–1.0)
Monocytes Relative: 15 %
Neutro Abs: 1.8 10*3/uL (ref 1.7–7.7)
Neutrophils Relative %: 31 %
Platelets: 203 10*3/uL (ref 150–400)
RBC: 2.89 MIL/uL — ABNORMAL LOW (ref 3.87–5.11)
RDW: 15.6 % — ABNORMAL HIGH (ref 11.5–15.5)
WBC: 5.8 10*3/uL (ref 4.0–10.5)
nRBC: 0 % (ref 0.0–0.2)

## 2020-08-24 LAB — COMPREHENSIVE METABOLIC PANEL
ALT: 6 U/L (ref 0–44)
AST: 15 U/L (ref 15–41)
Albumin: 3.6 g/dL (ref 3.5–5.0)
Alkaline Phosphatase: 64 U/L (ref 38–126)
Anion gap: 12 (ref 5–15)
BUN: 32 mg/dL — ABNORMAL HIGH (ref 8–23)
CO2: 26 mmol/L (ref 22–32)
Calcium: 11.2 mg/dL — ABNORMAL HIGH (ref 8.9–10.3)
Chloride: 107 mmol/L (ref 98–111)
Creatinine, Ser: 1.91 mg/dL — ABNORMAL HIGH (ref 0.44–1.00)
GFR, Estimated: 25 mL/min — ABNORMAL LOW (ref >60)
Glucose, Bld: 95 mg/dL (ref 70–99)
Potassium: 3.9 mmol/L (ref 3.5–5.1)
Sodium: 145 mmol/L (ref 135–145)
Total Bilirubin: 0.6 mg/dL (ref 0.3–1.2)
Total Protein: 6.9 g/dL (ref 6.5–8.1)

## 2020-08-25 ENCOUNTER — Encounter: Payer: Self-pay | Admitting: Podiatry

## 2020-08-25 NOTE — Progress Notes (Signed)
Subjective:  Patient ID: Kristin [name], female    DOB: [birth year],  MRN: [id number]  Chief Complaint  Patient presents with  . Nail Problem    Right hallux nail is inflammed    87 y.o. female presents with the above complaint.  Patient presents with distal hallux redness that came out of nowhere.  Patient states is inflamed.  There is mild skin peeling associated with it as well.  She does not recall any current local injury as well as stepping on anything such as nail.  She just wants to get evaluated make sure is not infection.  She has a history of peripheral artery disease without any diabetes.  She denies any other acute complaints.   Review of Systems: Negative except as noted in the HPI. Denies N/V/F/Ch.  Past Medical History:  Diagnosis Date  . Bone cancer [facility])    "being tx'd by Dr. Burr Medico @ [facility]" (10/09/2017)  . Chronic deep vein thrombosis (DVT) of both femoral veins ([place]): 12/05/17: Chronic recanalized CFV DVT bilateral 12/05/2017  . Critical lower limb ischemia ([place]) 10/04/2017   Critical limb ischemia  . Depression    Risperdal "twice/day" (09/23/2016)  . Glaucoma   . Hiatal hernia   . Intractable back pain    /notes 09/23/2016  . Obesity   . Osteoarthritis    "all over" (10/09/2017)  . Osteoporosis    vertebral fracture in 2012  . Peripheral vascular disease ([place])   . Upper GI bleed 04/2004   secondary to Mallory-Weiss tear/notes 12/28/2010  . Vertigo     Current Outpatient Medications:  .  doxycycline (VIBRA-TABS) 100 MG tablet, Take 1 tablet (100 mg total) by mouth 2 (two) times daily., Disp: 20 tablet, Rfl: 0 .  amLODipine (NORVASC) 2.5 MG tablet, Take 2.5 mg by mouth daily., Disp: , Rfl:  .  ASPIRIN ADULT LOW STRENGTH 81 MG EC tablet, Take 81 mg by mouth daily., Disp: , Rfl:  .  atorvastatin (LIPITOR) 10 MG tablet, Take 10 mg by mouth daily., Disp: , Rfl:  .  Calcium Carbonate-Vit D-Min (CALCIUM-VITAMIN D-MINERALS) 600-800 MG-UNIT CHEW, Chew 1 tablet by  mouth 2 (two) times daily. , Disp: , Rfl:  .  chlorhexidine (PERIDEX) 0.12 % solution, 2 (two) times a day., Disp: , Rfl:  .  feeding supplement, ENSURE ENLIVE, (ENSURE ENLIVE) LIQD, Take 237 mLs by mouth 2 (two) times daily between meals. (Patient not taking: Reported on 05/11/2020), Disp: 237 mL, Rfl: 12 .  furosemide (LASIX) 20 MG tablet, Take 20 mg by mouth daily., Disp: , Rfl:  .  irbesartan (AVAPRO) 75 MG tablet, Take 75 mg by mouth daily after supper., Disp: , Rfl:  .  lenalidomide (REVLIMID) 5 MG capsule, Take 1 capsule (5 mg total) by mouth daily. for 3 weeks, then 1 week rest [place] # [id number]     Date Obtained   Aug 18, 2020, Disp: 21 capsule, Rfl: 0 .  magnesium oxide (MAG-OX) 400 MG tablet, Take 1 tablet by mouth daily., Disp: , Rfl:  .  megestrol (MEGACE) 40 MG tablet, Take 40 mg by mouth daily., Disp: , Rfl:  .  mirtazapine (REMERON) 7.5 MG tablet, Take 7.5 mg by mouth at bedtime. , Disp: , Rfl:  .  morphine ([name]) 15 MG 12 hr tablet, Take 1 tablet (15 mg total) by mouth every 12 (twelve) hours., Disp: 60 tablet, Rfl: 0 .  Oxycodone HCl 10 MG TABS, TAKE ONE TABLET BY MOUTH EVERY 8 HOURS  AS NEEDED FOR moderate OR SEVERE pain, Disp: 30 tablet, Rfl: 0 .  risperiDONE (RISPERDAL) 0.5 MG tablet, Take 0.5 mg by mouth at bedtime. , Disp: , Rfl:  .  sennosides-docusate sodium (SENOKOT-S) 8.6-50 MG tablet, Take 1 tablet by mouth 2 (two) times daily. , Disp: , Rfl:  .  XARELTO 2.5 MG TABS tablet, TAKE ONE TABLET BY MOUTH EVERY MORNING and TAKE ONE TABLET BY MOUTH AT NOON, Disp: 180 tablet, Rfl: 1 No current facility-administered medications for this visit.  Facility-Administered Medications Ordered in Other Visits:  .  denosumab (XGEVA) injection 120 mg, 120 mg, Subcutaneous, Once, Truitt Merle, MD  Social History   Tobacco Use  Smoking Status Never Smoker  Smokeless Tobacco Never Used    Allergies  Allergen Reactions  . Penicillins Rash and Other (See Comments)    Has patient  had a PCN reaction causing immediate rash, facial/tongue/throat swelling, SOB or lightheadedness with hypotension: yes Has patient had a PCN reaction causing severe rash involving mucus membranes or skin necrosis: no Has patient had a PCN reaction that required hospitalization no Has patient had a PCN reaction occurring within the last 10 years:no If all of the above answers are "NO", then may proceed with Cephalosporin use.    . Tramadol     headache  . Alendronate Nausea Only and Other (See Comments)    Hip pain  . Sulfonamide Derivatives Itching and Rash   Objective:  There were no vitals filed for this visit. There is no height or weight on file to calculate BMI. Constitutional Well developed. Well nourished.  Vascular Dorsalis pedis pulses palpable bilaterally. Posterior tibial pulses palpable bilaterally. Capillary refill normal to all digits.  No cyanosis or clubbing noted. Pedal hair growth normal.  Neurologic Normal speech. Oriented to person, place, and time. Epicritic sensation to light touch grossly present bilaterally.  Dermatologic  mild redness noted to the distal tip of the right hallux.  The redness appears just slightly dissipate with elevation of the lower extremity.  No area of fluctuance or crepitus noted.  No area of ulceration noted.  Orthopedic: Normal joint ROM without pain or crepitus bilaterally. No visible deformities. No bony tenderness.   Radiographs: None Assessment:   1. PAD (peripheral artery disease) ([place])   2. Dependent rubor   3. Soft tissue infection    Plan:  Patient was evaluated and treated and all questions answered.  Right hallux distal tip dependent rubor versus soft tissue skin infection -Given the patient has a history of peripheral artery disease is likely the cause of the discoloration to the distal tip.  I discussed with him that just to rule out infectious component to it I will place her on doxycycline for 10 days.  She  states understanding will take it for 10 days.  No follow-ups on file.

## 2020-08-27 ENCOUNTER — Ambulatory Visit: Payer: Medicare Other | Admitting: Cardiology

## 2020-08-31 ENCOUNTER — Ambulatory Visit: Payer: Medicare Other | Admitting: Cardiology

## 2020-09-01 ENCOUNTER — Telehealth: Payer: Self-pay | Admitting: Nurse Practitioner

## 2020-09-01 ENCOUNTER — Encounter: Payer: Self-pay | Admitting: Nurse Practitioner

## 2020-09-01 NOTE — Telephone Encounter (Signed)
I called Ms. Kratky/daughter Hassan Rowan to review recent labs from 08/24/20. CBC and CMP drawn, but for reasons unknown lab did not obtain MM panel/light chains. Available results show worsening anemia, renal dysfunction, and hypercalcemia which together are concerning for MM progression. Hassan Rowan informs me Ms. Tilson continues to decline, now non-verbal and non-ambulatory and Dr. Jacelyn Grip has recommended Hospice, supposed to meet someone today. I agree this is appropriate at this point, and if she does enroll in hospice she will stop Revlimid and we can cancel future appointments at [facility]. I reviewed we are still here for Ms. Haack and the family in supportive/symptom management capacity. I explained if for some reason they do not enroll in Hospice to please let us know and we can schedule a follow up. She understands and appreciates the call.   Cira Rue, NP

## 2020-09-04 ENCOUNTER — Ambulatory Visit: Payer: Medicare Other | Admitting: Cardiology

## 2020-09-14 ENCOUNTER — Other Ambulatory Visit: Payer: Self-pay | Admitting: *Deleted

## 2020-09-14 ENCOUNTER — Other Ambulatory Visit: Payer: Self-pay | Admitting: Hematology

## 2020-09-14 DIAGNOSIS — C9000 Multiple myeloma not having achieved remission: Secondary | ICD-10-CM

## 2020-09-14 MED ORDER — LENALIDOMIDE 5 MG PO CAPS: 5.0000 mg | ORAL_CAPSULE | Freq: Every day | ORAL | 1 refills | Status: AC

## 2020-09-14 MED ORDER — LENALIDOMIDE 5 MG PO CAPS: 5.0000 mg | ORAL_CAPSULE | Freq: Every day | ORAL | 1 refills | Status: DC

## 2020-09-15 ENCOUNTER — Telehealth: Payer: Self-pay | Admitting: *Deleted

## 2020-09-15 NOTE — Telephone Encounter (Signed)
Spoke with pt's daughter, Lucita Ferrara & was informed that pt died 2020/09/27.  Called [facility] & cancelled Revlimid script.  Notified MD/Pod/HIM.  Will cancel all upcoming appts with [place].

## 2020-09-15 DEATH — deceased

## 2020-09-30 ENCOUNTER — Ambulatory Visit: Payer: Medicare Other | Admitting: Podiatry

## 2020-10-02 ENCOUNTER — Ambulatory Visit: Payer: Medicare Other | Admitting: Hematology

## 2020-10-02 ENCOUNTER — Other Ambulatory Visit: Payer: Medicare Other

## 2020-10-08 ENCOUNTER — Other Ambulatory Visit: Payer: Medicare Other

## 2020-10-08 ENCOUNTER — Ambulatory Visit: Payer: Medicare Other | Admitting: Hematology
